# Patient Record
Sex: Male | Born: 1945 | Race: White | Hispanic: No | Marital: Married | Smoking: Former smoker
Health system: Southern US, Community
[De-identification: ages and names within clinical notes are randomized; demographics above are authoritative.]

## PROBLEM LIST (undated history)

## (undated) DIAGNOSIS — I2510 Atherosclerotic heart disease of native coronary artery without angina pectoris: Secondary | ICD-10-CM

## (undated) DIAGNOSIS — L570 Actinic keratosis: Secondary | ICD-10-CM

## (undated) DIAGNOSIS — I10 Essential (primary) hypertension: Secondary | ICD-10-CM

## (undated) DIAGNOSIS — I700 Atherosclerosis of aorta: Secondary | ICD-10-CM

## (undated) DIAGNOSIS — R9389 Abnormal findings on diagnostic imaging of other specified body structures: Secondary | ICD-10-CM

## (undated) DIAGNOSIS — H409 Unspecified glaucoma: Secondary | ICD-10-CM

## (undated) DIAGNOSIS — J45991 Cough variant asthma: Secondary | ICD-10-CM

## (undated) DIAGNOSIS — N400 Enlarged prostate without lower urinary tract symptoms: Secondary | ICD-10-CM

## (undated) DIAGNOSIS — E7800 Pure hypercholesterolemia, unspecified: Secondary | ICD-10-CM

## (undated) DIAGNOSIS — G8929 Other chronic pain: Secondary | ICD-10-CM

## (undated) DIAGNOSIS — J309 Allergic rhinitis, unspecified: Secondary | ICD-10-CM

## (undated) DIAGNOSIS — J189 Pneumonia, unspecified organism: Secondary | ICD-10-CM

## (undated) DIAGNOSIS — K869 Disease of pancreas, unspecified: Secondary | ICD-10-CM

## (undated) DIAGNOSIS — J449 Chronic obstructive pulmonary disease, unspecified: Secondary | ICD-10-CM

## (undated) DIAGNOSIS — E119 Type 2 diabetes mellitus without complications: Secondary | ICD-10-CM

## (undated) DIAGNOSIS — D649 Anemia, unspecified: Secondary | ICD-10-CM

## (undated) DIAGNOSIS — M1990 Unspecified osteoarthritis, unspecified site: Secondary | ICD-10-CM

## (undated) HISTORY — DX: Pure hypercholesterolemia, unspecified: E78.00

## (undated) HISTORY — DX: Chronic obstructive pulmonary disease, unspecified: J44.9

## (undated) HISTORY — DX: Disease of pancreas, unspecified: K86.9

## (undated) HISTORY — DX: Anemia, unspecified: D64.9

## (undated) HISTORY — DX: Allergic rhinitis, unspecified: J30.9

## (undated) HISTORY — DX: Abnormal findings on diagnostic imaging of other specified body structures: R93.89

## (undated) HISTORY — DX: Cough variant asthma: J45.991

## (undated) HISTORY — DX: Benign prostatic hyperplasia without lower urinary tract symptoms: N40.0

## (undated) HISTORY — DX: Essential (primary) hypertension: I10

## (undated) HISTORY — DX: Unspecified glaucoma: H40.9

## (undated) HISTORY — DX: Other chronic pain: G89.29

## (undated) HISTORY — DX: Atherosclerosis of aorta: I70.0

## (undated) HISTORY — DX: Atherosclerotic heart disease of native coronary artery without angina pectoris: I25.10

## (undated) HISTORY — PX: BACK SURGERY: SHX140

## (undated) HISTORY — DX: Actinic keratosis: L57.0

## (undated) HISTORY — PX: TONSILLECTOMY: SUR1361

## (undated) HISTORY — DX: Type 2 diabetes mellitus without complications: E11.9

## (undated) HISTORY — PX: CERVICAL DISC SURGERY: SHX588

## (undated) HISTORY — PX: LEG SURGERY: SHX1003

---

## 1989-07-26 HISTORY — PX: KNEE ARTHROSCOPY: SHX127

## 1994-07-26 HISTORY — PX: ROTATOR CUFF REPAIR: SHX139

## 1997-07-26 HISTORY — PX: ROTATOR CUFF REPAIR: SHX139

## 1998-02-13 ENCOUNTER — Other Ambulatory Visit: Admission: RE | Admit: 1998-02-13 | Discharge: 1998-02-13 | Payer: Self-pay | Admitting: Urology

## 1999-12-25 ENCOUNTER — Encounter: Payer: Self-pay | Admitting: Emergency Medicine

## 1999-12-25 ENCOUNTER — Emergency Department (HOSPITAL_COMMUNITY): Admission: EM | Admit: 1999-12-25 | Discharge: 1999-12-25 | Payer: Self-pay | Admitting: Emergency Medicine

## 2000-07-26 HISTORY — PX: PROSTATE SURGERY: SHX751

## 2001-07-26 HISTORY — PX: BUNIONECTOMY: SHX129

## 2001-09-05 ENCOUNTER — Ambulatory Visit (HOSPITAL_COMMUNITY): Admission: RE | Admit: 2001-09-05 | Discharge: 2001-09-05 | Payer: Self-pay | Admitting: Gastroenterology

## 2001-09-05 ENCOUNTER — Encounter (INDEPENDENT_AMBULATORY_CARE_PROVIDER_SITE_OTHER): Payer: Self-pay | Admitting: Specialist

## 2002-05-03 ENCOUNTER — Observation Stay (HOSPITAL_COMMUNITY): Admission: RE | Admit: 2002-05-03 | Discharge: 2002-05-04 | Payer: Self-pay | Admitting: Orthopedic Surgery

## 2008-07-26 HISTORY — PX: GREEN LIGHT LASER TURP (TRANSURETHRAL RESECTION OF PROSTATE: SHX6260

## 2008-07-26 HISTORY — PX: LUMBAR DISC SURGERY: SHX700

## 2009-01-18 ENCOUNTER — Encounter: Admission: RE | Admit: 2009-01-18 | Discharge: 2009-01-18 | Payer: Self-pay | Admitting: Family Medicine

## 2009-02-10 ENCOUNTER — Ambulatory Visit (HOSPITAL_COMMUNITY): Admission: RE | Admit: 2009-02-10 | Discharge: 2009-02-10 | Payer: Self-pay | Admitting: Neurological Surgery

## 2009-07-26 HISTORY — PX: LUMBAR DISC SURGERY: SHX700

## 2009-09-11 ENCOUNTER — Emergency Department (HOSPITAL_COMMUNITY): Admission: EM | Admit: 2009-09-11 | Discharge: 2009-09-11 | Payer: Self-pay | Admitting: Emergency Medicine

## 2010-11-01 LAB — CBC
HCT: 38.8 % — ABNORMAL LOW (ref 39.0–52.0)
Hemoglobin: 13.4 g/dL (ref 13.0–17.0)
MCHC: 34.5 g/dL (ref 30.0–36.0)
MCV: 91.1 fL (ref 78.0–100.0)
Platelets: 137 10*3/uL — ABNORMAL LOW (ref 150–400)
RBC: 4.26 MIL/uL (ref 4.22–5.81)
RDW: 14.2 % (ref 11.5–15.5)
WBC: 6.6 10*3/uL (ref 4.0–10.5)

## 2010-11-01 LAB — BASIC METABOLIC PANEL
BUN: 28 mg/dL — ABNORMAL HIGH (ref 6–23)
CO2: 29 mEq/L (ref 19–32)
Calcium: 9.2 mg/dL (ref 8.4–10.5)
Chloride: 102 mEq/L (ref 96–112)
Creatinine, Ser: 0.87 mg/dL (ref 0.4–1.5)
GFR calc Af Amer: >60 mL/min (ref >60)
GFR calc non Af Amer: >60 mL/min (ref >60)
Glucose, Bld: 121 mg/dL — ABNORMAL HIGH (ref 70–99)
Potassium: 5 mEq/L (ref 3.5–5.1)
Sodium: 138 mEq/L (ref 135–145)

## 2010-11-01 LAB — GLUCOSE, CAPILLARY
Glucose-Capillary: 101 mg/dL — ABNORMAL HIGH (ref 70–99)
Glucose-Capillary: 108 mg/dL — ABNORMAL HIGH (ref 70–99)
Glucose-Capillary: 112 mg/dL — ABNORMAL HIGH (ref 70–99)
Glucose-Capillary: 91 mg/dL (ref 70–99)

## 2010-12-08 NOTE — Op Note (Signed)
NAME:  Isaac Weber, Isaac Weber              ACCOUNT NO.:  [id number]   MEDICAL RECORD NO.:  [id number]          PATIENT TYPE:  OIB   LOCATION:  3528                         FACILITY:  [facility]   PHYSICIAN:  Stefani Dama, M.D.  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   OPERATION:  Microdiskectomy, L1-L2 using operating microscope and  microdissection technique.   SURGEON:  Stefani Dama, MD   FIRST ASSISTANT:  Hilda Lias, MD   ANESTHESIA:  General endotracheal.   INDICATIONS:  Isaac Weber is a 65 year old individual who has had  significant back and left lower extremity pain in the proximal region of  the hip.  He was found to have a herniated nucleus pulposus at L1-L2 on  the left side on his MRI.  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  Because of the acuity of his radicular pain, I  believe the herniated nucleus pulposus at L1-L2 was giving him this  problem.  He is failing conservative management and advised regarding  surgical decompression.   PROCEDURE:  The patient was brought to the operating room, placed on the  table in supine position.  After the smooth induction of general  endotracheal anesthesia, he was turned prone.  The back was prepped with  alcohol, DuraPrep, and draped in sterile fashion.  Midline incision was  created approximately at the level of L1-L2.  Needle was used to  localize the internal mammary space, which radiographically turned out  to be at L1-L2.  The subperiosteal dissection was performed at the L1-L2  interspace and then with the muscle being retracted, laminotomy was  created at L1-L2 removing the  inferior margin lamina of L1, superior  margin lamina of L2 and the yellow ligament intervening.  Dissection was  carried out laterally and then along the lateral border of the dura,  just above the area of the nerve root.  The dura was noted to be tented  dorsally and by dissecting the lateral margin, the dura could be  retracted medially and thus identified a fragment of disk.  This disk  was removed from this area and several other fragments presented  themselves and they are also removed.  This was just below the level of  the L1-L2 disk space.  Further palpation yielded some other small  fragments of disk, but almost immediately there was noted to be good  decompression of the central dural tube and the takeoff of the L2 nerve  root that was coming out just below this.  The L2 nerve root could then  be palpated medially to this.  Some dissection was undertaken to see if  there are any other fragments of disk, none were identified.  Hemostasis  was then obtained in the epidural space.  The area of the disk  space  itself was then examined.  The ligament was noted to be tightly adherent  over the posterior surface of the interspace and with this minimal  hemorrhage was encountered.  No spinal fluid leaks were noted.  The  retractor was removed.  The lumbodorsal fascia was closed with #1 Vicryl  in an interrupted fashion, 2-0  Vicryl was used in the subcutaneous tissues, 3-0 Vicryl subcuticularly,  and Dermabond was placed on the skin.  The patient tolerated the  procedure well, was returned to recovery room in stable condition.   BLOOD LOSS:  Minimal.      Stefani Dama, M.D.  Electronically Signed     HJE/MEDQ  D:  02/10/2009  T:  02/11/2009  Job:  [id number]

## 2010-12-11 NOTE — Procedures (Signed)
[facility]  Patient:    Isaac Weber, Isaac Weber Visit Number: [id number] MRN: [id number]          Service Type: END Location: ENDO Attending Physician:  Louie Bun Dictated by:   Everardo All Madilyn Fireman, M.D. Proc. Date: 09/05/01 Admit Date:  09/05/2001   CC:         Al Decant. Janey Greaser, M.D.   Procedure Report  PROCEDURE:  Esophagogastroduodenoscopy with biopsy.  ENDOSCOPIST:  Everardo All. Madilyn Fireman, M.D.  INDICATIONS FOR PROCEDURE:  Anemia in a patient on chronic nonsteroidal anti-inflammatory drugs for arthritis in whom colonoscopy prior to this procedure revealed no abnormalities.  DESCRIPTION OF PROCEDURE:  The patient was placed in the left lateral decubitus position and placed on the pulse monitor with conscious low-flow oxygen delivered by nasal cannula.  He was sedated with 10 mg IV Demerol and 1 mg of Versed in addition to the medicines received for the previous colonoscopy.  The Olympus videoendoscope was advanced under direct vision into the oropharynx and esophagus.  The esophagus was straight and of normal caliber at the squamocolumnar line at 38 cm above a 1.5 cm sliding hiatal hernia.  There is no ring, stricture, or esophagitis visible.  The stomach was entered and a small amount of liquid secretions were suctioned from the fundus.  A retroflex view of the cardia confirmed a small hiatal hernia and was otherwise unremarkable.  The fundus and body appeared normal. The antrum showed some mild erythema and granularity consistent with gastritis.  A CLOtest was obtained.  Duodenum was entered and both the bulb and second portion were well-inspected and appeared to be within normal limits.  The scope was then withdrawn and the patient returned to the recovery room in stable condition.  He tolerated the procedure well and there were no immediate complications.  IMPRESSION: 1. Mild antral gastritis. 2. Small hiatal hernia.  PLAN:  Await histology  to assess for Helicobacter and celiac disease. Dictated by:   Everardo All Madilyn Fireman, M.D. Attending Physician:  Louie Bun DD:  09/05/01 TD:  09/05/01 Job: [id number]

## 2010-12-11 NOTE — Op Note (Signed)
NAME:  Isaac Weber, Isaac Weber                          ACCOUNT NO.:  [id number]   MEDICAL RECORD NO.:  [id number]                   PATIENT TYPE:  AMB   LOCATION:  DAY                                  FACILITY:  [facility]   PHYSICIAN:  Marlowe Kays, MD                 DATE OF BIRTH:  04/10/1946   DATE OF PROCEDURE:  DATE OF DISCHARGE:                                 OPERATIVE REPORT   PREOPERATIVE DIAGNOSES:  1. 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  2. Painful clawing second toe, left foot.   POSTOPERATIVE DIAGNOSES:  1. 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  2. Painful clawing second toe, left foot.   OPERATION:  1. Funk bunionectomy.  2. Claw toe deformity second toe with resection of base of proximal phalanx     and fusion of PIP joint.   SURGEON:  Marlowe Kays, MD   ASSISTANT:  Nurse.   ANESTHESIA:  General.   PATHOLOGY AND JUSTIFICATION FOR PROCEDURE:  He has a number of deformities  in his left foot. His lesser three toes all have some mild claw toe  deformity but they were not giving him much in the way of any problem and we  are not going to be doing anything surgically for them today. His second  toe, however, not only had a severe claw toe deformity but was also  dislocated at the MP joint. A good 15 degree first and second metatarsal  angle and large bunion with fixed hallux valgus deformity. Various options  for correcting the metatarsal primi's varus bunion and hallux valgus were  discussed with him and I felt that a Funk bunionectomy would be the one that  would give a combination of optimal results and quick recuperation time  which is what he needed.   DESCRIPTION OF PROCEDURE:  Satisfactory general anesthesia, pneumatic  tourniquet, Duraprep, the foot and ankle was draped in a sterile field.  Dorsal medial incision over the distal first metatarsal going down over the  base  of the proximal phalanx of the great toe. The dorsal sensory nerve was  protected and isolated from the capsule which was opened with flap based  distally. A large bunion deformity was isolated and bunionectomy performed  making a cut first to the base of the bunion proximally and then working  from distally to proximally with osteotome and rongeur. A small dorsal  bunion component was also removed. Following subperiosteal dissection, I  placed protective retractors superiorly and inferiorly to the distal  metatarsal and marked out two lines for the metatarsal osteotomy with the  distal one 1 cm from the articular surface and second one 6 mm proximal to  this. I then made a transverse cut and a more proximal one keeping the  lateral cortex intact and then made an oblique  cut at the more distal mark  removing the wedge of bone. The lateral cortex had been perforated with  small hand osteotome until it was weakened enough that I could close the  osteotomy down and swing the great toe and the metatarsal head together as a  unit correcting the valgus deformity. The additional bunion bone was removed  from the distal portion of the first metatarsal head. I then made a dorsal  extensor splitting incision going from roughly the DIP joint down proximal  to the MP joint of the second toe. The base of the proximal phalanx which  was dislocated on top of the second metatarsal head, it was isolated to  protect the underlying flexor tendons. I used the microsaw to osteotomize  the proximal phalanx at the metaphyseal flare. This allowed me to reduce the  second toe but still did not correct the PIP deformity and there was also a  secondary hyperextension deformity at the DIP joint. I isolated the head of  the middle phalanx and used the microsaw to make a perpendicular cut. I then  isolated the base of the middle phalanx and likewise made a transverse cut  there removing enough bone until I could  anatomically correct the deformity  at the PIP joint. I then used 0.45 smooth K wire which I placed through the  distal toe first and then retrograde through the PIP joint until I had it in  position which I wished and then advanced it stabilizing the second toe into  the second metatarsal head. Both wounds were then irrigated with antibiotic  solution and the toe was infiltrated with 0.5% plain Marcaine. The extensor  mechanism was closed with running 4-0 Vicryl in the second toe and the skin  with interrupted 4-0 nylon. The pin was bent and cut and covered with a pin  cap. The great toe was held in corrected position and the capsule closed  with #0 Vicryl and the skin and subcutaneous tissue with interrupted 4-0  nylon. I also placed small amounts of cancellous bone at the medial cortical  edge of the wedge osteotomy. Betadine Adaptic dry sterile dressing and  sterile tongue blade wrapped in Webril was then used along the inner border  of the foot and the great toe. The tourniquet was released. He tolerated the  procedure well and was taken to the recovery room in satisfactory condition  with no known complications.                                                Marlowe Kays, MD    JA/MEDQ  D:  05/03/2002  T:  05/03/2002  Job:  [id number]

## 2010-12-11 NOTE — Procedures (Signed)
[facility]  Patient:    ANUBIS, FUNDORA Visit Number: [id number] MRN: [id number]          Service Type: END Location: ENDO Attending Physician:  Louie Bun Dictated by:   Everardo All Madilyn Fireman, M.D. Proc. Date: 09/05/01 Admit Date:  09/05/2001   CC:         Al Decant. Janey Greaser, M.D.                           Procedure Report  PROCEDURE:  Colonoscopy.  SURGEON:  John C. Madilyn Fireman, M.D.  INDICATIONS FOR PROCEDURE:  A 65 year old patient with anemia.  He also has chronic arthritis and takes nonsteroidal anti-inflammatory drugs.  DESCRIPTION OF PROCEDURE:  The patient was placed in the left lateral decubitus position and placed on the pulse monitor with continuous low flow oxygen delivered by nasal cannula.  He was sedated with 60 mg of IV Demerol and 6 mg of IV Versed.  The Olympus video colonoscope was inserted into the rectum and advanced to the cecum, confirmed by transillumination at McBurneys point, and visualization of the ileocecal valve and appendiceal orifice.  The prep was excellent.  The cecum, ascending, transverse, descending, and sigmoid colon all appeared normal with no masses, polyps, diverticula, or other mucosal abnormalities.  The rectum likewise appeared normal with retroflexed view of the anus showing only small non-thrombosed internal hemorrhoids.  The colonoscope was then withdrawn and the patient returned to the recovery room in stable condition.  He tolerated the procedure well and there were no immediate complications.  IMPRESSION:  Small internal hemorrhoids, otherwise normal colonoscopy.  PLAN:  Based on the anemia, lack of findings on this study, and chronic use of NSAIDs, will pursue EGD subsequent to this procedure. Dictated by:   Everardo All Madilyn Fireman, M.D. Attending Physician:  Louie Bun DD:  09/05/01 TD:  09/05/01 Job: [id number]

## 2011-07-27 HISTORY — PX: BUNIONECTOMY WITH HAMMERTOE RECONSTRUCTION: SHX5600

## 2014-04-05 ENCOUNTER — Encounter: Payer: Self-pay | Admitting: *Deleted

## 2014-04-05 DIAGNOSIS — I10 Essential (primary) hypertension: Secondary | ICD-10-CM | POA: Insufficient documentation

## 2014-08-26 ENCOUNTER — Telehealth: Payer: Self-pay | Admitting: Internal Medicine

## 2014-08-26 NOTE — Telephone Encounter (Signed)
S/W PT IN REF TO NP APPT. 09/16/14@1 :60 REFERRING- DR Aundra Millet

## 2014-09-13 ENCOUNTER — Other Ambulatory Visit: Payer: Self-pay | Admitting: Medical Oncology

## 2014-09-13 DIAGNOSIS — D649 Anemia, unspecified: Secondary | ICD-10-CM

## 2014-09-16 ENCOUNTER — Ambulatory Visit (HOSPITAL_BASED_OUTPATIENT_CLINIC_OR_DEPARTMENT_OTHER): Payer: Self-pay

## 2014-09-16 ENCOUNTER — Encounter: Payer: Self-pay | Admitting: Internal Medicine

## 2014-09-16 ENCOUNTER — Other Ambulatory Visit: Payer: Self-pay | Admitting: Internal Medicine

## 2014-09-16 ENCOUNTER — Ambulatory Visit (HOSPITAL_BASED_OUTPATIENT_CLINIC_OR_DEPARTMENT_OTHER): Payer: Commercial Managed Care - HMO | Admitting: Internal Medicine

## 2014-09-16 ENCOUNTER — Encounter (INDEPENDENT_AMBULATORY_CARE_PROVIDER_SITE_OTHER): Payer: Self-pay

## 2014-09-16 ENCOUNTER — Other Ambulatory Visit (HOSPITAL_BASED_OUTPATIENT_CLINIC_OR_DEPARTMENT_OTHER): Payer: Commercial Managed Care - HMO

## 2014-09-16 VITALS — BP 157/89 | HR 112 | Temp 98.4°F | Resp 19 | Wt 177.2 lb

## 2014-09-16 DIAGNOSIS — D649 Anemia, unspecified: Secondary | ICD-10-CM

## 2014-09-16 DIAGNOSIS — D539 Nutritional anemia, unspecified: Secondary | ICD-10-CM

## 2014-09-16 DIAGNOSIS — D509 Iron deficiency anemia, unspecified: Secondary | ICD-10-CM

## 2014-09-16 LAB — CBC & DIFF AND RETIC
BASO%: 0.4 % (ref 0.0–2.0)
Basophils Absolute: 0 10*3/uL (ref 0.0–0.1)
EOS%: 3.9 % (ref 0.0–7.0)
Eosinophils Absolute: 0.3 10*3/uL (ref 0.0–0.5)
HEMATOCRIT: 33.6 % — AB (ref 38.4–49.9)
HEMOGLOBIN: 11.6 g/dL — AB (ref 13.0–17.1)
IMMATURE RETIC FRACT: 12.3 % — AB (ref 3.00–10.60)
LYMPH%: 26.6 % (ref 14.0–49.0)
MCH: 28.1 pg (ref 27.2–33.4)
MCHC: 34.5 g/dL (ref 32.0–36.0)
MCV: 81.4 fL (ref 79.3–98.0)
MONO#: 0.6 10*3/uL (ref 0.1–0.9)
MONO%: 8.8 % (ref 0.0–14.0)
NEUT%: 60.3 % (ref 39.0–75.0)
NEUTROS ABS: 4 10*3/uL (ref 1.5–6.5)
Platelets: 232 10*3/uL (ref 140–400)
RBC: 4.13 10*6/uL — AB (ref 4.20–5.82)
RDW: 13.9 % (ref 11.0–14.6)
Retic %: 1.55 % (ref 0.80–1.80)
Retic Ct Abs: 64.02 10*3/uL (ref 34.80–93.90)
WBC: 6.7 10*3/uL (ref 4.0–10.3)
lymph#: 1.8 10*3/uL (ref 0.9–3.3)
nRBC: 0 % (ref 0–0)

## 2014-09-16 LAB — COMPREHENSIVE METABOLIC PANEL (CC13)
ALK PHOS: 108 U/L (ref 40–150)
ALT: 18 U/L (ref 0–55)
ANION GAP: 10 meq/L (ref 3–11)
AST: 19 U/L (ref 5–34)
Albumin: 3.8 g/dL (ref 3.5–5.0)
BUN: 16.7 mg/dL (ref 7.0–26.0)
CO2: 26 meq/L (ref 22–29)
CREATININE: 0.9 mg/dL (ref 0.7–1.3)
Calcium: 9.4 mg/dL (ref 8.4–10.4)
Chloride: 104 mEq/L (ref 98–109)
EGFR: 82 mL/min/{1.73_m2} — ABNORMAL LOW (ref >90)
GLUCOSE: 153 mg/dL — AB (ref 70–140)
POTASSIUM: 3.7 meq/L (ref 3.5–5.1)
Sodium: 140 mEq/L (ref 136–145)
TOTAL PROTEIN: 7.3 g/dL (ref 6.4–8.3)
Total Bilirubin: 0.38 mg/dL (ref 0.20–1.20)

## 2014-09-16 LAB — IRON AND TIBC CHCC
%SAT: 14 % — AB (ref 20–55)
IRON: 55 ug/dL (ref 42–163)
TIBC: 395 ug/dL (ref 202–409)
UIBC: 340 ug/dL (ref 117–376)

## 2014-09-16 LAB — FERRITIN CHCC: FERRITIN: 10 ng/mL — AB (ref 22–316)

## 2014-09-16 LAB — LACTATE DEHYDROGENASE (CC13): LDH: 164 U/L (ref 125–245)

## 2014-09-16 NOTE — Progress Notes (Signed)
CHECKED IN NEW PATIENT WITH NO ISSUES. HE HAS APPT CRD AND HAS NOT BEEN TRAVELING.

## 2014-09-16 NOTE — Progress Notes (Signed)
[place] Telephone:([phone]   Fax:([phone]) [phone]  CONSULT NOTE  REFERRING PHYSICIAN: Dr. Leighton Ruff  REASON FOR CONSULTATION:  69 years old white male with iron deficiency anemia  HPI Isaac MCCANTS is a 69 y.o. 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 The patient was seen recently by his primary care physician Dr. Drema Dallas for routine evaluation and repeat CBC on 08/13/2014 showed hemoglobin of 12.4 and hematocrit 37.5% ferritin level was performed and it was low at 9.0. She referred the patient to me today for evaluation and recommendation regarding his condition. The patient had colonoscopy in May 2009 as well as upper endoscopy in June 2010 that were unremarkable. He denied having any bleeding issues. He recently had stool for Hemoccult but the result is still pending. He denied having any easy bruising, ecchymosis or other bleeding issues. He is a frequent blood donor to the [employer] and he does it every 8 weeks.  He has no significant complaints from his mild anemia. The patient exercises at regular basis. He denied having any fatigue or dizzy spells. He denied having any headache or visual changes. He has no chest pain, shortness breath, cough or hemoptysis. Family history significant for mother with heart disease, father had COPD, brother had heart disease and sister with arthritis. The patient is married and has 3 daughters. He is currently retired and used to work for [employer]. He has a remote history of smoking for around 13 years but quit in 1973. He drinks alcohol occasionally and no history of drug abuse.  HPI  Past Medical History  Diagnosis Date  . Diabetes mellitus without complication   . Hypertension   . BPH (benign  prostatic hyperplasia)     Past Surgical History  Procedure Laterality Date  . Knee arthroscopy    . Bunionectomy      Family History  Problem Relation Age of Onset  . Family history unknown: Yes    Social History History  Substance Use Topics  . Smoking status: Former Smoker    Quit date: 09/17/1971  . Smokeless tobacco: Not on file  . Alcohol Use: No    Allergies  Allergen Reactions  . Ciprofloxacin   . Morphine And Related     Current Outpatient Prescriptions  Medication Sig Dispense Refill  . amLODipine (NORVASC) 5 MG tablet Take 5 mg by mouth daily.    Marland Kitchen atorvastatin (LIPITOR) 40 MG tablet Take 40 mg by mouth every other day.     . Calcium Carb-Cholecalciferol (CALCIUM + D3 PO) Take by mouth.    . Glucosamine-Chondroitin (GLUCOSAMINE CHONDR COMPLEX PO) Take by mouth 2 (two) times daily.     Marland Kitchen losartan-hydrochlorothiazide (HYZAAR) 50-12.5 MG per tablet Take 1 tablet by mouth daily.    . metFORMIN (GLUCOPHAGE) 500 MG tablet Take by mouth 2 (two) times daily with a meal.    . ibuprofen (ADVIL,MOTRIN) 200 MG tablet Take 200 mg by mouth every 6 (six) hours as needed.     No current facility-administered medications for this visit.    Review of Systems  Constitutional: negative Eyes: negative Ears, nose, mouth, throat, and face: negative Respiratory: negative Cardiovascular: negative Gastrointestinal: negative Genitourinary:negative Integument/breast: negative Hematologic/lymphatic: negative Musculoskeletal:negative Neurological: negative Behavioral/Psych: negative Endocrine: negative Allergic/Immunologic: negative  Physical Exam  MIW:OEHOZ, healthy,  no distress, well nourished and well developed SKIN: skin color, texture, turgor are normal, no rashes or significant lesions HEAD: Normocephalic, No masses, lesions, tenderness or abnormalities EYES: normal, PERRLA EARS: External ears normal, Canals clear OROPHARYNX:no exudate, no erythema and lips,  buccal mucosa, and tongue normal  NECK: supple, no adenopathy, no JVD LYMPH:  no palpable lymphadenopathy, no hepatosplenomegaly LUNGS: clear to auscultation , and palpation HEART: regular rate & rhythm and no murmurs ABDOMEN:abdomen soft, non-tender, normal bowel sounds and no masses or organomegaly BACK: Back symmetric, no curvature., No CVA tenderness EXTREMITIES:no joint deformities, effusion, or inflammation, no edema, no skin discoloration  NEURO: alert & oriented x 3 with fluent speech, no focal motor/sensory deficits  PERFORMANCE STATUS: ECOG 1  LABORATORY DATA: Lab Results  Component Value Date   WBC 6.7 09/16/2014   HGB 11.6* 09/16/2014   HCT 33.6* 09/16/2014   MCV 81.4 09/16/2014   PLT 232 09/16/2014      Chemistry      Component Value Date/Time   NA 140 09/16/2014 1354   NA 138 02/10/2009 0835   K 3.7 09/16/2014 1354   K 5.0 HEMOLYZED SPECIMEN, RESULTS MAY BE AFFECTED 02/10/2009 0835   CL 102 02/10/2009 0835   CO2 26 09/16/2014 1354   CO2 29 02/10/2009 0835   BUN 16.7 09/16/2014 1354   BUN 28* 02/10/2009 0835   CREATININE 0.9 09/16/2014 1354   CREATININE 0.87 02/10/2009 0835      Component Value Date/Time   CALCIUM 9.4 09/16/2014 1354   CALCIUM 9.2 02/10/2009 0835   ALKPHOS 108 09/16/2014 1354   AST 19 09/16/2014 1354   ALT 18 09/16/2014 1354   BILITOT 0.38 09/16/2014 1354       RADIOGRAPHIC STUDIES: No results found.  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 The patient has no history of bleeding, bruises or ecchymosis.   PLAN: I had a lengthy discussion with the patient today about his condition. I ordered several studies for evaluation of his anemia and to rule out any other etiology. I will repeat CBC, comprehensive metabolic panel, LDH, serum folate, serum B-12 level, serum protein electrophoresis as well as serum erythropoietin. I recommended for the patient to  start taking oral iron tablets at regular basis especially with his frequent blood donation and iron deficiency from the blood donation. If no significant abnormalities in the pending blood work, the patient will follow with his primary care physician as previously scheduled and I'll be happy to see him in the future if needed. The patient agreed to the current plan. He was advised to call if he has any concerning symptoms.  The patient voices understanding of current disease status and treatment options and is in agreement with the current care plan.  All questions were answered. The patient knows to call the clinic with any problems, questions or concerns. We can certainly see the patient much sooner if necessary.  Thank you so much for allowing me to participate in the care of Isaac Weber. I will continue to follow up the patient with you and assist in his care.  I spent 40 minutes counseling the patient face to face. The total time spent in the appointment was 60 minutes.  Disclaimer: This note was dictated with voice recognition software. Similar sounding words can inadvertently be transcribed and may not be corrected upon review.   Rene Sizelove K. September 16, 2014, 3:11 PM

## 2014-09-18 LAB — PROTEIN ELECTROPHORESIS, SERUM, WITH REFLEX
ALPHA-2-GLOBULIN: 10 % (ref 7.1–11.8)
Albumin ELP: 57.9 % (ref 55.8–66.1)
Alpha-1-Globulin: 4.6 % (ref 2.9–4.9)
Beta 2: 4.6 % (ref 3.2–6.5)
Beta Globulin: 7.3 % — ABNORMAL HIGH (ref 4.7–7.2)
GAMMA GLOBULIN: 15.6 % (ref 11.1–18.8)
TOTAL PROTEIN, SERUM ELECTROPHOR: 6.7 g/dL (ref 6.0–8.3)

## 2014-09-18 LAB — ERYTHROPOIETIN: ERYTHROPOIETIN: 35.9 m[IU]/mL — AB (ref 2.6–18.5)

## 2014-09-18 LAB — FOLATE

## 2014-09-18 LAB — VITAMIN B12: Vitamin B-12: 1101 pg/mL — ABNORMAL HIGH (ref 211–911)

## 2015-08-19 DIAGNOSIS — E7800 Pure hypercholesterolemia, unspecified: Secondary | ICD-10-CM | POA: Diagnosis not present

## 2015-08-19 DIAGNOSIS — I10 Essential (primary) hypertension: Secondary | ICD-10-CM | POA: Diagnosis not present

## 2015-08-19 DIAGNOSIS — E119 Type 2 diabetes mellitus without complications: Secondary | ICD-10-CM | POA: Diagnosis not present

## 2015-08-19 DIAGNOSIS — H5203 Hypermetropia, bilateral: Secondary | ICD-10-CM | POA: Diagnosis not present

## 2015-08-19 DIAGNOSIS — Z0000 Encounter for general adult medical examination without abnormal findings: Secondary | ICD-10-CM | POA: Diagnosis not present

## 2015-08-19 DIAGNOSIS — D509 Iron deficiency anemia, unspecified: Secondary | ICD-10-CM | POA: Diagnosis not present

## 2015-08-19 DIAGNOSIS — Z7984 Long term (current) use of oral hypoglycemic drugs: Secondary | ICD-10-CM | POA: Diagnosis not present

## 2015-08-19 DIAGNOSIS — N400 Enlarged prostate without lower urinary tract symptoms: Secondary | ICD-10-CM | POA: Diagnosis not present

## 2015-08-19 DIAGNOSIS — Z1389 Encounter for screening for other disorder: Secondary | ICD-10-CM | POA: Diagnosis not present

## 2015-08-19 DIAGNOSIS — H2513 Age-related nuclear cataract, bilateral: Secondary | ICD-10-CM | POA: Diagnosis not present

## 2015-09-29 DIAGNOSIS — N401 Enlarged prostate with lower urinary tract symptoms: Secondary | ICD-10-CM | POA: Diagnosis not present

## 2015-09-29 DIAGNOSIS — Z0000 Encounter for general adult medical examination without abnormal findings: Secondary | ICD-10-CM | POA: Diagnosis not present

## 2015-09-29 DIAGNOSIS — Z125 Encounter for screening for malignant neoplasm of prostate: Secondary | ICD-10-CM | POA: Diagnosis not present

## 2015-09-29 DIAGNOSIS — R351 Nocturia: Secondary | ICD-10-CM | POA: Diagnosis not present

## 2015-11-12 DIAGNOSIS — R21 Rash and other nonspecific skin eruption: Secondary | ICD-10-CM | POA: Diagnosis not present

## 2015-11-12 DIAGNOSIS — L820 Inflamed seborrheic keratosis: Secondary | ICD-10-CM | POA: Diagnosis not present

## 2015-11-12 DIAGNOSIS — L570 Actinic keratosis: Secondary | ICD-10-CM | POA: Diagnosis not present

## 2015-11-12 DIAGNOSIS — L821 Other seborrheic keratosis: Secondary | ICD-10-CM | POA: Diagnosis not present

## 2015-11-12 DIAGNOSIS — B372 Candidiasis of skin and nail: Secondary | ICD-10-CM | POA: Diagnosis not present

## 2015-11-12 DIAGNOSIS — L720 Epidermal cyst: Secondary | ICD-10-CM | POA: Diagnosis not present

## 2015-11-12 DIAGNOSIS — L578 Other skin changes due to chronic exposure to nonionizing radiation: Secondary | ICD-10-CM | POA: Diagnosis not present

## 2016-02-19 DIAGNOSIS — E7800 Pure hypercholesterolemia, unspecified: Secondary | ICD-10-CM | POA: Diagnosis not present

## 2016-02-19 DIAGNOSIS — E119 Type 2 diabetes mellitus without complications: Secondary | ICD-10-CM | POA: Diagnosis not present

## 2016-02-19 DIAGNOSIS — I10 Essential (primary) hypertension: Secondary | ICD-10-CM | POA: Diagnosis not present

## 2016-02-19 DIAGNOSIS — D509 Iron deficiency anemia, unspecified: Secondary | ICD-10-CM | POA: Diagnosis not present

## 2016-02-19 DIAGNOSIS — Z7984 Long term (current) use of oral hypoglycemic drugs: Secondary | ICD-10-CM | POA: Diagnosis not present

## 2016-02-26 DIAGNOSIS — M9903 Segmental and somatic dysfunction of lumbar region: Secondary | ICD-10-CM | POA: Diagnosis not present

## 2016-02-26 DIAGNOSIS — M5136 Other intervertebral disc degeneration, lumbar region: Secondary | ICD-10-CM | POA: Diagnosis not present

## 2016-02-26 DIAGNOSIS — M5432 Sciatica, left side: Secondary | ICD-10-CM | POA: Diagnosis not present

## 2016-02-26 DIAGNOSIS — M9905 Segmental and somatic dysfunction of pelvic region: Secondary | ICD-10-CM | POA: Diagnosis not present

## 2016-02-27 DIAGNOSIS — M9905 Segmental and somatic dysfunction of pelvic region: Secondary | ICD-10-CM | POA: Diagnosis not present

## 2016-02-27 DIAGNOSIS — M5136 Other intervertebral disc degeneration, lumbar region: Secondary | ICD-10-CM | POA: Diagnosis not present

## 2016-02-27 DIAGNOSIS — M9903 Segmental and somatic dysfunction of lumbar region: Secondary | ICD-10-CM | POA: Diagnosis not present

## 2016-02-27 DIAGNOSIS — M5432 Sciatica, left side: Secondary | ICD-10-CM | POA: Diagnosis not present

## 2016-02-28 DIAGNOSIS — M9905 Segmental and somatic dysfunction of pelvic region: Secondary | ICD-10-CM | POA: Diagnosis not present

## 2016-02-28 DIAGNOSIS — M5432 Sciatica, left side: Secondary | ICD-10-CM | POA: Diagnosis not present

## 2016-02-28 DIAGNOSIS — M9903 Segmental and somatic dysfunction of lumbar region: Secondary | ICD-10-CM | POA: Diagnosis not present

## 2016-02-28 DIAGNOSIS — M5136 Other intervertebral disc degeneration, lumbar region: Secondary | ICD-10-CM | POA: Diagnosis not present

## 2016-03-01 DIAGNOSIS — M9905 Segmental and somatic dysfunction of pelvic region: Secondary | ICD-10-CM | POA: Diagnosis not present

## 2016-03-01 DIAGNOSIS — M9903 Segmental and somatic dysfunction of lumbar region: Secondary | ICD-10-CM | POA: Diagnosis not present

## 2016-03-01 DIAGNOSIS — M5432 Sciatica, left side: Secondary | ICD-10-CM | POA: Diagnosis not present

## 2016-03-01 DIAGNOSIS — M5136 Other intervertebral disc degeneration, lumbar region: Secondary | ICD-10-CM | POA: Diagnosis not present

## 2016-03-03 DIAGNOSIS — M5432 Sciatica, left side: Secondary | ICD-10-CM | POA: Diagnosis not present

## 2016-03-03 DIAGNOSIS — M9905 Segmental and somatic dysfunction of pelvic region: Secondary | ICD-10-CM | POA: Diagnosis not present

## 2016-03-03 DIAGNOSIS — M5136 Other intervertebral disc degeneration, lumbar region: Secondary | ICD-10-CM | POA: Diagnosis not present

## 2016-03-03 DIAGNOSIS — M9903 Segmental and somatic dysfunction of lumbar region: Secondary | ICD-10-CM | POA: Diagnosis not present

## 2016-03-05 DIAGNOSIS — M5136 Other intervertebral disc degeneration, lumbar region: Secondary | ICD-10-CM | POA: Diagnosis not present

## 2016-03-05 DIAGNOSIS — M5432 Sciatica, left side: Secondary | ICD-10-CM | POA: Diagnosis not present

## 2016-03-05 DIAGNOSIS — M9905 Segmental and somatic dysfunction of pelvic region: Secondary | ICD-10-CM | POA: Diagnosis not present

## 2016-03-05 DIAGNOSIS — M9903 Segmental and somatic dysfunction of lumbar region: Secondary | ICD-10-CM | POA: Diagnosis not present

## 2016-03-08 DIAGNOSIS — M5136 Other intervertebral disc degeneration, lumbar region: Secondary | ICD-10-CM | POA: Diagnosis not present

## 2016-03-08 DIAGNOSIS — M5432 Sciatica, left side: Secondary | ICD-10-CM | POA: Diagnosis not present

## 2016-03-08 DIAGNOSIS — M9903 Segmental and somatic dysfunction of lumbar region: Secondary | ICD-10-CM | POA: Diagnosis not present

## 2016-03-08 DIAGNOSIS — M9905 Segmental and somatic dysfunction of pelvic region: Secondary | ICD-10-CM | POA: Diagnosis not present

## 2016-03-09 DIAGNOSIS — M5136 Other intervertebral disc degeneration, lumbar region: Secondary | ICD-10-CM | POA: Diagnosis not present

## 2016-03-09 DIAGNOSIS — M5432 Sciatica, left side: Secondary | ICD-10-CM | POA: Diagnosis not present

## 2016-03-09 DIAGNOSIS — M9903 Segmental and somatic dysfunction of lumbar region: Secondary | ICD-10-CM | POA: Diagnosis not present

## 2016-03-09 DIAGNOSIS — M9905 Segmental and somatic dysfunction of pelvic region: Secondary | ICD-10-CM | POA: Diagnosis not present

## 2016-03-15 DIAGNOSIS — M9905 Segmental and somatic dysfunction of pelvic region: Secondary | ICD-10-CM | POA: Diagnosis not present

## 2016-03-15 DIAGNOSIS — M9903 Segmental and somatic dysfunction of lumbar region: Secondary | ICD-10-CM | POA: Diagnosis not present

## 2016-03-15 DIAGNOSIS — M5432 Sciatica, left side: Secondary | ICD-10-CM | POA: Diagnosis not present

## 2016-03-15 DIAGNOSIS — M5136 Other intervertebral disc degeneration, lumbar region: Secondary | ICD-10-CM | POA: Diagnosis not present

## 2016-03-17 DIAGNOSIS — M9905 Segmental and somatic dysfunction of pelvic region: Secondary | ICD-10-CM | POA: Diagnosis not present

## 2016-03-17 DIAGNOSIS — M9903 Segmental and somatic dysfunction of lumbar region: Secondary | ICD-10-CM | POA: Diagnosis not present

## 2016-03-17 DIAGNOSIS — M5432 Sciatica, left side: Secondary | ICD-10-CM | POA: Diagnosis not present

## 2016-03-17 DIAGNOSIS — M5136 Other intervertebral disc degeneration, lumbar region: Secondary | ICD-10-CM | POA: Diagnosis not present

## 2016-03-19 DIAGNOSIS — M5432 Sciatica, left side: Secondary | ICD-10-CM | POA: Diagnosis not present

## 2016-03-19 DIAGNOSIS — M5136 Other intervertebral disc degeneration, lumbar region: Secondary | ICD-10-CM | POA: Diagnosis not present

## 2016-03-19 DIAGNOSIS — M9905 Segmental and somatic dysfunction of pelvic region: Secondary | ICD-10-CM | POA: Diagnosis not present

## 2016-03-19 DIAGNOSIS — M9903 Segmental and somatic dysfunction of lumbar region: Secondary | ICD-10-CM | POA: Diagnosis not present

## 2016-03-22 DIAGNOSIS — M5136 Other intervertebral disc degeneration, lumbar region: Secondary | ICD-10-CM | POA: Diagnosis not present

## 2016-03-22 DIAGNOSIS — M9903 Segmental and somatic dysfunction of lumbar region: Secondary | ICD-10-CM | POA: Diagnosis not present

## 2016-03-22 DIAGNOSIS — M9905 Segmental and somatic dysfunction of pelvic region: Secondary | ICD-10-CM | POA: Diagnosis not present

## 2016-03-22 DIAGNOSIS — M5432 Sciatica, left side: Secondary | ICD-10-CM | POA: Diagnosis not present

## 2016-03-24 DIAGNOSIS — M5432 Sciatica, left side: Secondary | ICD-10-CM | POA: Diagnosis not present

## 2016-03-24 DIAGNOSIS — M9905 Segmental and somatic dysfunction of pelvic region: Secondary | ICD-10-CM | POA: Diagnosis not present

## 2016-03-24 DIAGNOSIS — M5136 Other intervertebral disc degeneration, lumbar region: Secondary | ICD-10-CM | POA: Diagnosis not present

## 2016-03-24 DIAGNOSIS — M9903 Segmental and somatic dysfunction of lumbar region: Secondary | ICD-10-CM | POA: Diagnosis not present

## 2016-03-30 DIAGNOSIS — M5136 Other intervertebral disc degeneration, lumbar region: Secondary | ICD-10-CM | POA: Diagnosis not present

## 2016-03-30 DIAGNOSIS — M9903 Segmental and somatic dysfunction of lumbar region: Secondary | ICD-10-CM | POA: Diagnosis not present

## 2016-03-30 DIAGNOSIS — M5432 Sciatica, left side: Secondary | ICD-10-CM | POA: Diagnosis not present

## 2016-03-30 DIAGNOSIS — M9905 Segmental and somatic dysfunction of pelvic region: Secondary | ICD-10-CM | POA: Diagnosis not present

## 2016-03-31 DIAGNOSIS — M5136 Other intervertebral disc degeneration, lumbar region: Secondary | ICD-10-CM | POA: Diagnosis not present

## 2016-03-31 DIAGNOSIS — M9903 Segmental and somatic dysfunction of lumbar region: Secondary | ICD-10-CM | POA: Diagnosis not present

## 2016-03-31 DIAGNOSIS — M9905 Segmental and somatic dysfunction of pelvic region: Secondary | ICD-10-CM | POA: Diagnosis not present

## 2016-03-31 DIAGNOSIS — M5432 Sciatica, left side: Secondary | ICD-10-CM | POA: Diagnosis not present

## 2016-04-05 DIAGNOSIS — M5432 Sciatica, left side: Secondary | ICD-10-CM | POA: Diagnosis not present

## 2016-04-05 DIAGNOSIS — M5136 Other intervertebral disc degeneration, lumbar region: Secondary | ICD-10-CM | POA: Diagnosis not present

## 2016-04-05 DIAGNOSIS — M9903 Segmental and somatic dysfunction of lumbar region: Secondary | ICD-10-CM | POA: Diagnosis not present

## 2016-04-05 DIAGNOSIS — M9905 Segmental and somatic dysfunction of pelvic region: Secondary | ICD-10-CM | POA: Diagnosis not present

## 2016-04-07 DIAGNOSIS — M5432 Sciatica, left side: Secondary | ICD-10-CM | POA: Diagnosis not present

## 2016-04-07 DIAGNOSIS — M9903 Segmental and somatic dysfunction of lumbar region: Secondary | ICD-10-CM | POA: Diagnosis not present

## 2016-04-07 DIAGNOSIS — M5136 Other intervertebral disc degeneration, lumbar region: Secondary | ICD-10-CM | POA: Diagnosis not present

## 2016-04-07 DIAGNOSIS — M9905 Segmental and somatic dysfunction of pelvic region: Secondary | ICD-10-CM | POA: Diagnosis not present

## 2016-04-12 DIAGNOSIS — M5432 Sciatica, left side: Secondary | ICD-10-CM | POA: Diagnosis not present

## 2016-04-12 DIAGNOSIS — M9903 Segmental and somatic dysfunction of lumbar region: Secondary | ICD-10-CM | POA: Diagnosis not present

## 2016-04-12 DIAGNOSIS — M9905 Segmental and somatic dysfunction of pelvic region: Secondary | ICD-10-CM | POA: Diagnosis not present

## 2016-04-12 DIAGNOSIS — M5136 Other intervertebral disc degeneration, lumbar region: Secondary | ICD-10-CM | POA: Diagnosis not present

## 2016-04-14 DIAGNOSIS — M5136 Other intervertebral disc degeneration, lumbar region: Secondary | ICD-10-CM | POA: Diagnosis not present

## 2016-04-14 DIAGNOSIS — M9905 Segmental and somatic dysfunction of pelvic region: Secondary | ICD-10-CM | POA: Diagnosis not present

## 2016-04-14 DIAGNOSIS — M5432 Sciatica, left side: Secondary | ICD-10-CM | POA: Diagnosis not present

## 2016-04-14 DIAGNOSIS — M9903 Segmental and somatic dysfunction of lumbar region: Secondary | ICD-10-CM | POA: Diagnosis not present

## 2016-04-20 DIAGNOSIS — M5136 Other intervertebral disc degeneration, lumbar region: Secondary | ICD-10-CM | POA: Diagnosis not present

## 2016-04-20 DIAGNOSIS — M9905 Segmental and somatic dysfunction of pelvic region: Secondary | ICD-10-CM | POA: Diagnosis not present

## 2016-04-20 DIAGNOSIS — M9903 Segmental and somatic dysfunction of lumbar region: Secondary | ICD-10-CM | POA: Diagnosis not present

## 2016-04-20 DIAGNOSIS — M5432 Sciatica, left side: Secondary | ICD-10-CM | POA: Diagnosis not present

## 2016-04-27 DIAGNOSIS — M5136 Other intervertebral disc degeneration, lumbar region: Secondary | ICD-10-CM | POA: Diagnosis not present

## 2016-04-27 DIAGNOSIS — M9903 Segmental and somatic dysfunction of lumbar region: Secondary | ICD-10-CM | POA: Diagnosis not present

## 2016-04-27 DIAGNOSIS — M5432 Sciatica, left side: Secondary | ICD-10-CM | POA: Diagnosis not present

## 2016-04-27 DIAGNOSIS — M9905 Segmental and somatic dysfunction of pelvic region: Secondary | ICD-10-CM | POA: Diagnosis not present

## 2016-05-17 DIAGNOSIS — L821 Other seborrheic keratosis: Secondary | ICD-10-CM | POA: Diagnosis not present

## 2016-05-17 DIAGNOSIS — D229 Melanocytic nevi, unspecified: Secondary | ICD-10-CM | POA: Diagnosis not present

## 2016-05-17 DIAGNOSIS — D1800 Hemangioma unspecified site: Secondary | ICD-10-CM | POA: Diagnosis not present

## 2016-05-17 DIAGNOSIS — L570 Actinic keratosis: Secondary | ICD-10-CM | POA: Diagnosis not present

## 2016-05-17 DIAGNOSIS — L812 Freckles: Secondary | ICD-10-CM | POA: Diagnosis not present

## 2016-05-17 DIAGNOSIS — L700 Acne vulgaris: Secondary | ICD-10-CM | POA: Diagnosis not present

## 2016-05-17 DIAGNOSIS — Z1283 Encounter for screening for malignant neoplasm of skin: Secondary | ICD-10-CM | POA: Diagnosis not present

## 2016-05-18 DIAGNOSIS — M9905 Segmental and somatic dysfunction of pelvic region: Secondary | ICD-10-CM | POA: Diagnosis not present

## 2016-05-18 DIAGNOSIS — M9903 Segmental and somatic dysfunction of lumbar region: Secondary | ICD-10-CM | POA: Diagnosis not present

## 2016-05-18 DIAGNOSIS — M5136 Other intervertebral disc degeneration, lumbar region: Secondary | ICD-10-CM | POA: Diagnosis not present

## 2016-05-18 DIAGNOSIS — M5432 Sciatica, left side: Secondary | ICD-10-CM | POA: Diagnosis not present

## 2016-06-15 DIAGNOSIS — M9905 Segmental and somatic dysfunction of pelvic region: Secondary | ICD-10-CM | POA: Diagnosis not present

## 2016-06-15 DIAGNOSIS — M9903 Segmental and somatic dysfunction of lumbar region: Secondary | ICD-10-CM | POA: Diagnosis not present

## 2016-06-15 DIAGNOSIS — M5432 Sciatica, left side: Secondary | ICD-10-CM | POA: Diagnosis not present

## 2016-06-15 DIAGNOSIS — M5136 Other intervertebral disc degeneration, lumbar region: Secondary | ICD-10-CM | POA: Diagnosis not present

## 2016-08-10 DIAGNOSIS — L570 Actinic keratosis: Secondary | ICD-10-CM | POA: Diagnosis not present

## 2016-08-19 DIAGNOSIS — Z7984 Long term (current) use of oral hypoglycemic drugs: Secondary | ICD-10-CM | POA: Diagnosis not present

## 2016-08-19 DIAGNOSIS — E7800 Pure hypercholesterolemia, unspecified: Secondary | ICD-10-CM | POA: Diagnosis not present

## 2016-08-19 DIAGNOSIS — H524 Presbyopia: Secondary | ICD-10-CM | POA: Diagnosis not present

## 2016-08-19 DIAGNOSIS — E119 Type 2 diabetes mellitus without complications: Secondary | ICD-10-CM | POA: Diagnosis not present

## 2016-08-19 DIAGNOSIS — D509 Iron deficiency anemia, unspecified: Secondary | ICD-10-CM | POA: Diagnosis not present

## 2016-08-19 DIAGNOSIS — H5203 Hypermetropia, bilateral: Secondary | ICD-10-CM | POA: Diagnosis not present

## 2016-08-19 DIAGNOSIS — H2513 Age-related nuclear cataract, bilateral: Secondary | ICD-10-CM | POA: Diagnosis not present

## 2016-08-19 DIAGNOSIS — I10 Essential (primary) hypertension: Secondary | ICD-10-CM | POA: Diagnosis not present

## 2016-08-23 DIAGNOSIS — E7800 Pure hypercholesterolemia, unspecified: Secondary | ICD-10-CM | POA: Diagnosis not present

## 2016-08-23 DIAGNOSIS — Z1389 Encounter for screening for other disorder: Secondary | ICD-10-CM | POA: Diagnosis not present

## 2016-08-23 DIAGNOSIS — Z0000 Encounter for general adult medical examination without abnormal findings: Secondary | ICD-10-CM | POA: Diagnosis not present

## 2016-08-23 DIAGNOSIS — D509 Iron deficiency anemia, unspecified: Secondary | ICD-10-CM | POA: Diagnosis not present

## 2016-08-23 DIAGNOSIS — I10 Essential (primary) hypertension: Secondary | ICD-10-CM | POA: Diagnosis not present

## 2016-08-23 DIAGNOSIS — E119 Type 2 diabetes mellitus without complications: Secondary | ICD-10-CM | POA: Diagnosis not present

## 2016-08-23 DIAGNOSIS — N400 Enlarged prostate without lower urinary tract symptoms: Secondary | ICD-10-CM | POA: Diagnosis not present

## 2016-08-30 DIAGNOSIS — M5432 Sciatica, left side: Secondary | ICD-10-CM | POA: Diagnosis not present

## 2016-08-30 DIAGNOSIS — M5136 Other intervertebral disc degeneration, lumbar region: Secondary | ICD-10-CM | POA: Diagnosis not present

## 2016-08-30 DIAGNOSIS — M9905 Segmental and somatic dysfunction of pelvic region: Secondary | ICD-10-CM | POA: Diagnosis not present

## 2016-08-30 DIAGNOSIS — M9903 Segmental and somatic dysfunction of lumbar region: Secondary | ICD-10-CM | POA: Diagnosis not present

## 2016-09-01 DIAGNOSIS — M9903 Segmental and somatic dysfunction of lumbar region: Secondary | ICD-10-CM | POA: Diagnosis not present

## 2016-09-01 DIAGNOSIS — M5136 Other intervertebral disc degeneration, lumbar region: Secondary | ICD-10-CM | POA: Diagnosis not present

## 2016-09-01 DIAGNOSIS — M5432 Sciatica, left side: Secondary | ICD-10-CM | POA: Diagnosis not present

## 2016-09-01 DIAGNOSIS — M9905 Segmental and somatic dysfunction of pelvic region: Secondary | ICD-10-CM | POA: Diagnosis not present

## 2016-09-03 DIAGNOSIS — M9903 Segmental and somatic dysfunction of lumbar region: Secondary | ICD-10-CM | POA: Diagnosis not present

## 2016-09-03 DIAGNOSIS — M9905 Segmental and somatic dysfunction of pelvic region: Secondary | ICD-10-CM | POA: Diagnosis not present

## 2016-09-03 DIAGNOSIS — M5432 Sciatica, left side: Secondary | ICD-10-CM | POA: Diagnosis not present

## 2016-09-03 DIAGNOSIS — M5136 Other intervertebral disc degeneration, lumbar region: Secondary | ICD-10-CM | POA: Diagnosis not present

## 2016-09-06 DIAGNOSIS — M5136 Other intervertebral disc degeneration, lumbar region: Secondary | ICD-10-CM | POA: Diagnosis not present

## 2016-09-06 DIAGNOSIS — M9903 Segmental and somatic dysfunction of lumbar region: Secondary | ICD-10-CM | POA: Diagnosis not present

## 2016-09-06 DIAGNOSIS — M9905 Segmental and somatic dysfunction of pelvic region: Secondary | ICD-10-CM | POA: Diagnosis not present

## 2016-09-06 DIAGNOSIS — M5432 Sciatica, left side: Secondary | ICD-10-CM | POA: Diagnosis not present

## 2016-09-08 DIAGNOSIS — M9905 Segmental and somatic dysfunction of pelvic region: Secondary | ICD-10-CM | POA: Diagnosis not present

## 2016-09-08 DIAGNOSIS — M5136 Other intervertebral disc degeneration, lumbar region: Secondary | ICD-10-CM | POA: Diagnosis not present

## 2016-09-08 DIAGNOSIS — M9903 Segmental and somatic dysfunction of lumbar region: Secondary | ICD-10-CM | POA: Diagnosis not present

## 2016-09-08 DIAGNOSIS — M5432 Sciatica, left side: Secondary | ICD-10-CM | POA: Diagnosis not present

## 2016-09-10 DIAGNOSIS — M9903 Segmental and somatic dysfunction of lumbar region: Secondary | ICD-10-CM | POA: Diagnosis not present

## 2016-09-10 DIAGNOSIS — M5136 Other intervertebral disc degeneration, lumbar region: Secondary | ICD-10-CM | POA: Diagnosis not present

## 2016-09-10 DIAGNOSIS — M9905 Segmental and somatic dysfunction of pelvic region: Secondary | ICD-10-CM | POA: Diagnosis not present

## 2016-09-10 DIAGNOSIS — M5432 Sciatica, left side: Secondary | ICD-10-CM | POA: Diagnosis not present

## 2016-09-13 DIAGNOSIS — M5136 Other intervertebral disc degeneration, lumbar region: Secondary | ICD-10-CM | POA: Diagnosis not present

## 2016-09-13 DIAGNOSIS — M9905 Segmental and somatic dysfunction of pelvic region: Secondary | ICD-10-CM | POA: Diagnosis not present

## 2016-09-13 DIAGNOSIS — M9903 Segmental and somatic dysfunction of lumbar region: Secondary | ICD-10-CM | POA: Diagnosis not present

## 2016-09-13 DIAGNOSIS — M5432 Sciatica, left side: Secondary | ICD-10-CM | POA: Diagnosis not present

## 2016-09-15 DIAGNOSIS — M9905 Segmental and somatic dysfunction of pelvic region: Secondary | ICD-10-CM | POA: Diagnosis not present

## 2016-09-15 DIAGNOSIS — M5136 Other intervertebral disc degeneration, lumbar region: Secondary | ICD-10-CM | POA: Diagnosis not present

## 2016-09-15 DIAGNOSIS — M5432 Sciatica, left side: Secondary | ICD-10-CM | POA: Diagnosis not present

## 2016-09-15 DIAGNOSIS — M9903 Segmental and somatic dysfunction of lumbar region: Secondary | ICD-10-CM | POA: Diagnosis not present

## 2016-09-20 DIAGNOSIS — M5136 Other intervertebral disc degeneration, lumbar region: Secondary | ICD-10-CM | POA: Diagnosis not present

## 2016-09-20 DIAGNOSIS — M9905 Segmental and somatic dysfunction of pelvic region: Secondary | ICD-10-CM | POA: Diagnosis not present

## 2016-09-20 DIAGNOSIS — M9903 Segmental and somatic dysfunction of lumbar region: Secondary | ICD-10-CM | POA: Diagnosis not present

## 2016-09-20 DIAGNOSIS — M5432 Sciatica, left side: Secondary | ICD-10-CM | POA: Diagnosis not present

## 2016-09-23 DIAGNOSIS — Z125 Encounter for screening for malignant neoplasm of prostate: Secondary | ICD-10-CM | POA: Diagnosis not present

## 2016-09-28 DIAGNOSIS — N401 Enlarged prostate with lower urinary tract symptoms: Secondary | ICD-10-CM | POA: Diagnosis not present

## 2016-09-28 DIAGNOSIS — R351 Nocturia: Secondary | ICD-10-CM | POA: Diagnosis not present

## 2016-11-18 DIAGNOSIS — L570 Actinic keratosis: Secondary | ICD-10-CM | POA: Diagnosis not present

## 2016-11-18 DIAGNOSIS — L578 Other skin changes due to chronic exposure to nonionizing radiation: Secondary | ICD-10-CM | POA: Diagnosis not present

## 2016-11-18 DIAGNOSIS — L821 Other seborrheic keratosis: Secondary | ICD-10-CM | POA: Diagnosis not present

## 2016-11-18 DIAGNOSIS — L812 Freckles: Secondary | ICD-10-CM | POA: Diagnosis not present

## 2016-11-18 DIAGNOSIS — L820 Inflamed seborrheic keratosis: Secondary | ICD-10-CM | POA: Diagnosis not present

## 2017-02-16 DIAGNOSIS — M9903 Segmental and somatic dysfunction of lumbar region: Secondary | ICD-10-CM | POA: Diagnosis not present

## 2017-02-16 DIAGNOSIS — M5432 Sciatica, left side: Secondary | ICD-10-CM | POA: Diagnosis not present

## 2017-02-16 DIAGNOSIS — M9905 Segmental and somatic dysfunction of pelvic region: Secondary | ICD-10-CM | POA: Diagnosis not present

## 2017-02-16 DIAGNOSIS — M5136 Other intervertebral disc degeneration, lumbar region: Secondary | ICD-10-CM | POA: Diagnosis not present

## 2017-02-18 DIAGNOSIS — M5136 Other intervertebral disc degeneration, lumbar region: Secondary | ICD-10-CM | POA: Diagnosis not present

## 2017-02-18 DIAGNOSIS — M791 Myalgia: Secondary | ICD-10-CM | POA: Diagnosis not present

## 2017-02-18 DIAGNOSIS — I10 Essential (primary) hypertension: Secondary | ICD-10-CM | POA: Diagnosis not present

## 2017-02-18 DIAGNOSIS — Z7984 Long term (current) use of oral hypoglycemic drugs: Secondary | ICD-10-CM | POA: Diagnosis not present

## 2017-02-18 DIAGNOSIS — M9905 Segmental and somatic dysfunction of pelvic region: Secondary | ICD-10-CM | POA: Diagnosis not present

## 2017-02-18 DIAGNOSIS — E7800 Pure hypercholesterolemia, unspecified: Secondary | ICD-10-CM | POA: Diagnosis not present

## 2017-02-18 DIAGNOSIS — E119 Type 2 diabetes mellitus without complications: Secondary | ICD-10-CM | POA: Diagnosis not present

## 2017-02-18 DIAGNOSIS — M9903 Segmental and somatic dysfunction of lumbar region: Secondary | ICD-10-CM | POA: Diagnosis not present

## 2017-02-18 DIAGNOSIS — M5432 Sciatica, left side: Secondary | ICD-10-CM | POA: Diagnosis not present

## 2017-02-18 DIAGNOSIS — N400 Enlarged prostate without lower urinary tract symptoms: Secondary | ICD-10-CM | POA: Diagnosis not present

## 2017-02-21 DIAGNOSIS — L578 Other skin changes due to chronic exposure to nonionizing radiation: Secondary | ICD-10-CM | POA: Diagnosis not present

## 2017-02-21 DIAGNOSIS — L821 Other seborrheic keratosis: Secondary | ICD-10-CM | POA: Diagnosis not present

## 2017-02-21 DIAGNOSIS — D229 Melanocytic nevi, unspecified: Secondary | ICD-10-CM | POA: Diagnosis not present

## 2017-02-21 DIAGNOSIS — L570 Actinic keratosis: Secondary | ICD-10-CM | POA: Diagnosis not present

## 2017-02-21 DIAGNOSIS — L812 Freckles: Secondary | ICD-10-CM | POA: Diagnosis not present

## 2017-04-27 DIAGNOSIS — R05 Cough: Secondary | ICD-10-CM | POA: Diagnosis not present

## 2017-05-19 DIAGNOSIS — J9801 Acute bronchospasm: Secondary | ICD-10-CM | POA: Diagnosis not present

## 2017-06-09 DIAGNOSIS — E7800 Pure hypercholesterolemia, unspecified: Secondary | ICD-10-CM | POA: Diagnosis not present

## 2017-06-22 DIAGNOSIS — M5432 Sciatica, left side: Secondary | ICD-10-CM | POA: Diagnosis not present

## 2017-06-22 DIAGNOSIS — M9905 Segmental and somatic dysfunction of pelvic region: Secondary | ICD-10-CM | POA: Diagnosis not present

## 2017-06-22 DIAGNOSIS — M9903 Segmental and somatic dysfunction of lumbar region: Secondary | ICD-10-CM | POA: Diagnosis not present

## 2017-06-22 DIAGNOSIS — M5136 Other intervertebral disc degeneration, lumbar region: Secondary | ICD-10-CM | POA: Diagnosis not present

## 2017-06-23 DIAGNOSIS — R05 Cough: Secondary | ICD-10-CM | POA: Diagnosis not present

## 2017-07-05 DIAGNOSIS — M5432 Sciatica, left side: Secondary | ICD-10-CM | POA: Diagnosis not present

## 2017-07-05 DIAGNOSIS — M9905 Segmental and somatic dysfunction of pelvic region: Secondary | ICD-10-CM | POA: Diagnosis not present

## 2017-07-05 DIAGNOSIS — M5136 Other intervertebral disc degeneration, lumbar region: Secondary | ICD-10-CM | POA: Diagnosis not present

## 2017-07-05 DIAGNOSIS — M9903 Segmental and somatic dysfunction of lumbar region: Secondary | ICD-10-CM | POA: Diagnosis not present

## 2017-07-08 ENCOUNTER — Ambulatory Visit: Payer: PPO | Admitting: Internal Medicine

## 2017-07-08 ENCOUNTER — Encounter: Payer: Self-pay | Admitting: Internal Medicine

## 2017-07-08 VITALS — BP 130/78 | HR 87 | Ht 69.0 in | Wt 177.8 lb

## 2017-07-08 DIAGNOSIS — Z87891 Personal history of nicotine dependence: Secondary | ICD-10-CM | POA: Diagnosis not present

## 2017-07-08 DIAGNOSIS — R05 Cough: Secondary | ICD-10-CM | POA: Diagnosis not present

## 2017-07-08 DIAGNOSIS — R053 Chronic cough: Secondary | ICD-10-CM

## 2017-07-08 MED ORDER — MOMETASONE FUROATE 220 MCG/INH IN AEPB: 2.0000 | INHALATION_SPRAY | Freq: Every day | RESPIRATORY_TRACT | 0 refills | Status: DC

## 2017-07-08 MED ORDER — PREDNISONE 10 MG PO TABS: ORAL_TABLET | ORAL | 0 refills | Status: DC

## 2017-07-08 MED ORDER — MOMETASONE FUROATE 220 MCG/INH IN AEPB: 2.0000 | INHALATION_SPRAY | Freq: Every day | RESPIRATORY_TRACT | 12 refills | Status: DC

## 2017-07-08 NOTE — Progress Notes (Signed)
Subjective:    Patient ID: Isaac Weber, male    DOB: 28-Sep-1945, 71 y.o.   MRN: [id number] PCP Leighton Ruff, MD  HPI    IOV  07/08/2017  Chief Complaint  Patient presents with  . Advice Only    Referred by Dr. Drema Dallas for cough x71months.  Pt states that his cough is  a dry cough. Pt was put on nexium x2 weeks for acid reflux which he states has loosened the mucus some.      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  He tells me that mid September 2018 he went to the beach.  Upon return from the beach his granddaughter was sick with a cough and subsequently on April 16, 2017 he abruptly developed a cough.  Since then the cough has persisted.  The only thing that has improved and his cough is that he is no longer having significant night cough although he still does have some  amount of night cough.  This night cough resolved after opioid cough syrup.  Cough is made worse by talking, laughing and also randomly.  It is associated with clearing of the throat.  He feels a tightness in the upper chest.  There is no associated wheezing but there is still some residual nocturnal symptoms.  There is no associated acid reflux.  He has been on Nexium for a week or 2 without much relief.  He has not tried anything for nasal but he denies any nasal discharge.  He is not on any ACE inhibitors.  He does not have a previous history of asthma but his exam nitric oxide today is elevated borderline at 44 ppb.  He does clear the throat.  The cough is annoying.  He tells me that he did have a chest x-ray with his primary care physician that was clear.  The history is obtained from him, talking to his wife and review of the primary care physician referral notes.  Lab review she had a hemoglobin 11.6 g% in February 2016 and a eosinophils f 300 cells.  He is a former smoker  He does not have any shortness of breath.  He walks over a mile.  Sometimes he  will cough and a pro-air can help him.  He does find the [employer] helps him.  At this point in time he prefers for conservative simplistic line of treatment    Dr Lorenza Cambridge Reflux Symptom Index (> 13-15 suggestive of LPR cough) 0 -> 5  =  none ->severe problem.td 07/08/2017   Hoarseness of problem with voice 1  Clearing  Of Throat 3  Excess throat mucus or feeling of post nasal drip 1  Difficulty swallowing food, liquid or tablets 0  Cough after eating or lying down 4  Breathing difficulties or choking episodes 0  Troublesome or annoying cough 5  Sensation of something sticking in throat or lump in throat 0  Heartburn, chest pain, indigestion, or stomach acid coming up 0  TOTAL 14     feno 43 ppb   Results for LEE, KUANG (MRN [id number]) as of 07/08/2017 10:59  Ref. Range 02/10/2009 08:35 09/16/2014 13:54  Eosinophils Absolute Latest Ref Range: 0.0 - 0.5 10e3/uL  0.3      has a past medical history of BPH (benign prostatic hyperplasia), Diabetes mellitus without complication ([place]), and Hypertension.   reports that he quit smoking about 45 years ago. His smoking use  included cigarettes. He has a 42.00 pack-year smoking history. he has never used smokeless tobacco.  Past Surgical History:  Procedure Laterality Date  . BUNIONECTOMY    . KNEE ARTHROSCOPY      Allergies  Allergen Reactions  . Ciprofloxacin   . Morphine And Related      There is no immunization history on file for this patient.  Family History  Family history unknown: Yes     Current Outpatient Medications:  .  amLODipine (NORVASC) 5 MG tablet, Take 5 mg by mouth daily., Disp: , Rfl:  .  benzonatate (TESSALON) 200 MG capsule, TK ONE C PO UP TO BID DURING DAYTIME PRN, Disp: , Rfl: 0 .  Calcium Carb-Cholecalciferol (CALCIUM + D3 PO), Take by mouth., Disp: , Rfl:  .  Glucosamine-Chondroitin (GLUCOSAMINE CHONDR COMPLEX PO), Take by mouth 2 (two) times daily. , Disp: , Rfl:  .  ibuprofen  (ADVIL,MOTRIN) 200 MG tablet, Take 200 mg by mouth every 6 (six) hours as needed., Disp: , Rfl:  .  losartan-hydrochlorothiazide (HYZAAR) 50-12.5 MG per tablet, Take 1 tablet by mouth daily., Disp: , Rfl:  .  metFORMIN (GLUCOPHAGE) 500 MG tablet, Take by mouth 2 (two) times daily with a meal., Disp: , Rfl:  .  PROAIR HFA 108 (90 Base) MCG/ACT inhaler, INL 2 PFS PO INTO THE LUNGS BID PRN, Disp: , Rfl: 0 .  rosuvastatin (CRESTOR) 5 MG tablet, , Disp: , Rfl:     Review of Systems  Constitutional: Negative for fever and unexpected weight change.  HENT: Positive for sneezing. Negative for congestion, dental problem, ear pain, nosebleeds, postnasal drip, rhinorrhea, sinus pressure, sore throat and trouble swallowing.   Eyes: Negative for redness and itching.  Respiratory: Positive for cough and shortness of breath. Negative for chest tightness and wheezing.   Cardiovascular: Negative for palpitations and leg swelling.  Gastrointestinal: Negative for nausea and vomiting.  Genitourinary: Negative for dysuria.  Musculoskeletal: Negative for joint swelling.  Skin: Negative for rash.  Allergic/Immunologic: Negative.  Negative for environmental allergies, food allergies and immunocompromised state.  Neurological: Negative for headaches.  Hematological: Does not bruise/bleed easily.  Psychiatric/Behavioral: Negative for dysphoric mood. The patient is not nervous/anxious.        Objective:   Physical Exam Vitals:   07/08/17 1033  BP: 130/78  Pulse: 87  SpO2: 98%  Weight: 177 lb 12.8 oz (80.6 kg)  Height: 5\' 9"  (1.753 m)    Estimated body mass index is 26.26 kg/m as calculated from the following:   Height as of this encounter: 5\' 9"  (1.753 m).   Weight as of this encounter: 177 lb 12.8 oz (80.6 kg).        Assessment & Plan:     ICD-10-CM   1. Chronic cough R05   2. Former smoker, stopped smoking in distant past Z87.891     Cough could be from cough varian asthma Whether there  is underlying sinus drainage, and silent acid reflux making cough worse unclear All of this is I think is  working together to cause cyclical cough/LPR cough or called cough neuropathy WE will treat the  Possible asthma piece first  #Possible Sinus drainage  -start nasal steroid generic fluticasone inhaler 2 squirts each nostril daily as advised    #Possible Acid Reflux  -continue your acid reflux treatment nexium for now  - At all times avoid colas, spices, cheeses, spirits, red meats, beer, chocolates, fried foods etc., (to extent possible over xmas)  - sleep  with head end of bed elevated  - eat small frequent meals  - do not go to bed for 3 hours after last meal  #Possible Asthma  -  Please take prednisone 40 mg x1 day, then 30 mg x1 day, then 20 mg x1 day, then 10 mg x1 day, and then 5 mg x1 day and stop - start inhaler on scheduled daily basis asmanex 226mcg 2 puff twice daily  #Followup - flu shot next week - I will see you in 4-6 weeks; cough score at followup - any problems call or come sooner  - if cough still a problem then consider CT sinus/Chest    Dr. Brand Males, M.D., [facility].C.P Pulmonary and Critical Care Medicine Staff Physician, [place] Director - Interstitial Lung Disease  Program  Pulmonary [place] at [place], [place], [address]  Pager: [phone], If no answer or between  15:00h - 7:00h: call [phone] Telephone: [phone]

## 2017-07-08 NOTE — Patient Instructions (Addendum)
ICD-10-CM   1. Chronic cough R05   2. Former smoker, stopped smoking in distant past Z87.891    Cough could be from cough varian asthma Whether there is underlying sinus drainage, and silent acid reflux making cough worse unclear All of this is I think is  working together to cause cyclical cough/LPR cough or called cough neuropathy WE will treat the  Possible asthma piece first  #Possible Sinus drainage  -start nasal steroid generic fluticasone inhaler 2 squirts each nostril daily as advised    #Possible Acid Reflux  -continue your acid reflux treatment nexium for now  - At all times avoid colas, spices, cheeses, spirits, red meats, beer, chocolates, fried foods etc., (to extent possible over xmas)  - sleep with head end of bed elevated  - eat small frequent meals  - do not go to bed for 3 hours after last meal  #Possible Asthma  -  Please take prednisone 40 mg x1 day, then 30 mg x1 day, then 20 mg x1 day, then 10 mg x1 day, and then 5 mg x1 day and stop - start inhaler on scheduled daily basis asmanex 251mcg 2 puff twice daily  #Followup - flu shot next week - I will see you in 4-6 weeks; cough score at followup - any problems call or come sooner  - if cough still a problem then consider CT sinus/Chest/pulmonary function test

## 2017-08-09 ENCOUNTER — Encounter: Payer: Self-pay | Admitting: Internal Medicine

## 2017-08-09 NOTE — Telephone Encounter (Signed)
Pt sent email for [facility], I would like to take this chance to let you know how my treatment is going prior to my appointment. I hate when I walk out of the Doctors office and say oh I wish I said this or that. Doctor when we spoke I had know idea I was going to go on such a strict diet, your nurse let me know your wish. One thing about a [employer] when we are told to do something we do it, so rest assured I followed your diet to the letter. It was very tough but I did it. After I finished the Prednisone for about four days my cough was pretty much gone. Then it came back with a vengeance .I've pretty much just continued the inhaler and of course the diet( thank God my wife is a wonder cook) I've been able to follow every aspect of the diet. Even though I lost weight and my blood sugar has improved I'm hoping I can go back to my regular diet. The cough has improved but I can't say it is gone, but it is not as consent. I'm not sure what has worked I hope you know.look forward seeing you the24th.  Routing to MR

## 2017-08-10 NOTE — Telephone Encounter (Signed)
Ok to go back on regular diet . Rest to be discussed on 24th visit  Dr. Brand Males, M.D., [facility].C.P Pulmonary and Critical Care Medicine Staff Physician, [place] Director - Interstitial Lung Disease  Program  Pulmonary [place] at [place], [place], [address]  Pager: [phone], If no answer or between  15:00h - 7:00h: call [phone] Telephone: [phone]

## 2017-08-18 ENCOUNTER — Ambulatory Visit: Payer: PPO | Admitting: Internal Medicine

## 2017-08-18 ENCOUNTER — Encounter: Payer: Self-pay | Admitting: Internal Medicine

## 2017-08-18 VITALS — BP 142/84 | HR 82 | Ht 69.0 in | Wt 173.2 lb

## 2017-08-18 DIAGNOSIS — Z862 Personal history of diseases of the blood and blood-forming organs and certain disorders involving the immune mechanism: Secondary | ICD-10-CM | POA: Diagnosis not present

## 2017-08-18 DIAGNOSIS — R053 Chronic cough: Secondary | ICD-10-CM

## 2017-08-18 DIAGNOSIS — E7800 Pure hypercholesterolemia, unspecified: Secondary | ICD-10-CM | POA: Diagnosis not present

## 2017-08-18 DIAGNOSIS — E119 Type 2 diabetes mellitus without complications: Secondary | ICD-10-CM | POA: Diagnosis not present

## 2017-08-18 DIAGNOSIS — R05 Cough: Secondary | ICD-10-CM

## 2017-08-18 MED ORDER — MOMETASONE FUROATE 220 MCG/INH IN AEPB: 2.0000 | INHALATION_SPRAY | Freq: Every day | RESPIRATORY_TRACT | 0 refills | Status: DC

## 2017-08-18 MED ORDER — MOMETASONE FUROATE 220 MCG/INH IN AEPB: 2.0000 | INHALATION_SPRAY | Freq: Every day | RESPIRATORY_TRACT | 12 refills | Status: DC

## 2017-08-18 NOTE — Patient Instructions (Addendum)
ICD-10-CM   1. Chronic cough R05     Glad you are a lot better You likely have cough variant asthma  Plan Stop prilosec and can slowly liberalize diet: if this is making cough worse please call us Continue asmanex 2 puff twice daily for another 3 months and then try to reduce it 1 pufff twice daily to continue  Followup 6 months for cough with Dr Chase Caller; return sooner if needed

## 2017-08-18 NOTE — Progress Notes (Signed)
Subjective:     Patient ID: Isaac Weber, male   DOB: [birth year], 72 y.o.   MRN: [id number]  HPI  PCP Leighton Ruff, MD  HPI    IOV  07/08/2017  Chief Complaint  Patient presents with  . Advice Only    Referred by Dr. Drema Dallas for cough x62months.  Pt states that his cough is  a dry cough. Pt was put on nexium x2 weeks for acid reflux which he states has loosened the mucus some.      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  He tells me that mid September 2018 he went to the beach.  Upon return from the beach his granddaughter was sick with a cough and subsequently on April 16, 2017 he abruptly developed a cough.  Since then the cough has persisted.  The only thing that has improved and his cough is that he is no longer having significant night cough although he still does have some  amount of night cough.  This night cough resolved after opioid cough syrup.  Cough is made worse by talking, laughing and also randomly.  It is associated with clearing of the throat.  He feels a tightness in the upper chest.  There is no associated wheezing but there is still some residual nocturnal symptoms.  There is no associated acid reflux.  He has been on Nexium for a week or 2 without much relief.  He has not tried anything for nasal but he denies any nasal discharge.  He is not on any ACE inhibitors.  He does not have a previous history of asthma but his exam nitric oxide today is elevated borderline at 44 ppb.  He does clear the throat.  The cough is annoying.  He tells me that he did have a chest x-ray with his primary care physician that was clear.  The history is obtained from him, talking to his wife and review of the primary care physician referral notes.  Lab review she had a hemoglobin 11.6 g% in February 2016 and a eosinophils f 300 cells.  He is a former smoker  He does not have any shortness of breath.  He walks over a mile.   Sometimes he will cough and a pro-air can help him.  He does find the [employer] helps him.  At this point in time he prefers for conservative simplistic line of treatment   OV 08/18/2017  Chief Complaint  Patient presents with  . Follow-up    Pt states he has been doing good since last visit. Cough is better.   Follow-up chronic cough  After last visit he decided to take Asmanex.  He also followed a diet and took Prilosec.  With this the cough is more than 60% better as documented in the RSI cough score.  He does not want to take any more new medications.  He feels prednisone burst helped him a lot in the initial few days.  He is wondering which of these measures have helped him the most.  There are no new issues.  He wants to liberalize his diet which he says is actually helping him with the sugars and with the cough but he would like to be less disciplined about food.  He is willing to continue with Asmanex   Dr Lorenza Cambridge Reflux Symptom Index (> 13-15 suggestive of LPR cough) 0 -> 5  =  none ->severe problem.td 07/08/2017  08/18/2017   Hoarseness of problem with voice 1 2  Clearing  Of Throat 3 2  Excess throat mucus or feeling of post nasal drip 1 0  Difficulty swallowing food, liquid or tablets 0 0  Cough after eating or lying down 4 1  Breathing difficulties or choking episodes 0 0  Troublesome or annoying cough 5 2  Sensation of something sticking in throat or lump in throat 0 0  Heartburn, chest pain, indigestion, or stomach acid coming up 0 0  TOTAL 14 7     feno 43 ppb   Results for KAMAREON, SCIANDRA (MRN [id number]) as of 07/08/2017 10:59  Ref. Range 02/10/2009 08:35 09/16/2014 13:54  Eosinophils Absolute Latest Ref Range: 0.0 - 0.5 10e3/uL  0.3      has a past medical history of BPH (benign prostatic hyperplasia), Diabetes mellitus without complication ([place]), and Hypertension.   reports that he quit smoking about 45 years ago. His smoking use included cigarettes. He  has a 42.00 pack-year smoking history. he has never used smokeless tobacco.  Past Surgical History:  Procedure Laterality Date  . BUNIONECTOMY    . KNEE ARTHROSCOPY      Allergies  Allergen Reactions  . Ciprofloxacin   . Morphine And Related     Immunization History  Administered Date(s) Administered  . Influenza, High Dose Seasonal PF 07/15/2017    Family History  Family history unknown: Yes     Current Outpatient Medications:  .  amLODipine (NORVASC) 5 MG tablet, Take 5 mg by mouth daily., Disp: , Rfl:  .  Calcium Carb-Cholecalciferol (CALCIUM + D3 PO), Take by mouth., Disp: , Rfl:  .  Glucosamine-Chondroitin (GLUCOSAMINE CHONDR COMPLEX PO), Take by mouth 2 (two) times daily. , Disp: , Rfl:  .  ibuprofen (ADVIL,MOTRIN) 200 MG tablet, Take 200 mg by mouth every 6 (six) hours as needed., Disp: , Rfl:  .  losartan-hydrochlorothiazide (HYZAAR) 50-12.5 MG per tablet, Take 1 tablet by mouth daily., Disp: , Rfl:  .  metFORMIN (GLUCOPHAGE) 500 MG tablet, Take by mouth 2 (two) times daily with a meal., Disp: , Rfl:  .  mometasone (ASMANEX 60 METERED DOSES) 220 MCG/INH inhaler, Inhale 2 puffs into the lungs daily., Disp: 1 Inhaler, Rfl: 12 .  PROAIR HFA 108 (90 Base) MCG/ACT inhaler, INL 2 PFS PO INTO THE LUNGS BID PRN, Disp: , Rfl: 0 .  rosuvastatin (CRESTOR) 5 MG tablet, , Disp: , Rfl:  .  benzonatate (TESSALON) 200 MG capsule, TK ONE C PO UP TO BID DURING DAYTIME PRN, Disp: , Rfl: 0     Review of Systems     Objective:   Physical Exam  Constitutional: He is oriented to person, place, and time. He appears well-developed and well-nourished. No distress.  HENT:  Head: Normocephalic and atraumatic.  Right Ear: External ear normal.  Left Ear: External ear normal.  Mouth/Throat: Oropharynx is clear and moist. No oropharyngeal exudate.  Eyes: Conjunctivae and EOM are normal. Pupils are equal, round, and reactive to light. Right eye exhibits no discharge. Left eye exhibits no  discharge. No scleral icterus.  Neck: Normal range of motion. Neck supple. No JVD present. No tracheal deviation present. No thyromegaly present.  Cardiovascular: Normal rate, regular rhythm and intact distal pulses. Exam reveals no gallop and no friction rub.  No murmur heard. Pulmonary/Chest: Effort normal and breath sounds normal. No respiratory distress. He has no wheezes. He has no rales. He exhibits no tenderness.  Abdominal: Soft. Bowel sounds  are normal. He exhibits no distension and no mass. There is no tenderness. There is no rebound and no guarding.  Musculoskeletal: Normal range of motion. He exhibits no edema or tenderness.  Lymphadenopathy:    He has no cervical adenopathy.  Neurological: He is alert and oriented to person, place, and time. He has normal reflexes. No cranial nerve deficit. Coordination normal.  Skin: Skin is warm and dry. No rash noted. He is not diaphoretic. No erythema. No pallor.  Psychiatric: He has a normal mood and affect. His behavior is normal. Judgment and thought content normal.  Nursing note and vitals reviewed.  Vitals:   08/18/17 1056  BP: (!) 142/84  Pulse: 82  SpO2: 95%  Weight: 173 lb 3.2 oz (78.6 kg)  Height: 5\' 9"  (1.753 m)    Estimated body mass index is 25.58 kg/m as calculated from the following:   Height as of this encounter: 5\' 9"  (1.753 m).   Weight as of this encounter: 173 lb 3.2 oz (78.6 kg).      Assessment:       ICD-10-CM   1. Chronic cough R05        Plan:       Glad you are a lot better You likely have cough variant asthma  Plan Stop prilosec and can slowly liberalize diet: if this is making cough worse please call us Continue asmanex 2 puff twice daily for another 3 months and then try to reduce it 1 pufff twice daily to continue  Followup 6 months for cough with Dr Chase Caller; return sooner if needed   Dr. Brand Males, M.D., [facility].C.P Pulmonary and Critical Care Medicine Staff Physician, [place] Director - Interstitial Lung Disease  Program  Pulmonary [place] at [place], [place], [address]  Pager: [phone], If no answer or between  15:00h - 7:00h: call [phone] Telephone: [phone]

## 2017-08-25 DIAGNOSIS — I10 Essential (primary) hypertension: Secondary | ICD-10-CM | POA: Diagnosis not present

## 2017-08-25 DIAGNOSIS — Z862 Personal history of diseases of the blood and blood-forming organs and certain disorders involving the immune mechanism: Secondary | ICD-10-CM | POA: Diagnosis not present

## 2017-08-25 DIAGNOSIS — E119 Type 2 diabetes mellitus without complications: Secondary | ICD-10-CM | POA: Diagnosis not present

## 2017-08-25 DIAGNOSIS — N400 Enlarged prostate without lower urinary tract symptoms: Secondary | ICD-10-CM | POA: Diagnosis not present

## 2017-08-25 DIAGNOSIS — Z0000 Encounter for general adult medical examination without abnormal findings: Secondary | ICD-10-CM | POA: Diagnosis not present

## 2017-08-25 DIAGNOSIS — Z1211 Encounter for screening for malignant neoplasm of colon: Secondary | ICD-10-CM | POA: Diagnosis not present

## 2017-08-25 DIAGNOSIS — E7800 Pure hypercholesterolemia, unspecified: Secondary | ICD-10-CM | POA: Diagnosis not present

## 2017-08-25 DIAGNOSIS — Z1389 Encounter for screening for other disorder: Secondary | ICD-10-CM | POA: Diagnosis not present

## 2017-09-22 DIAGNOSIS — H2513 Age-related nuclear cataract, bilateral: Secondary | ICD-10-CM | POA: Diagnosis not present

## 2017-09-22 DIAGNOSIS — H524 Presbyopia: Secondary | ICD-10-CM | POA: Diagnosis not present

## 2017-09-22 DIAGNOSIS — R972 Elevated prostate specific antigen [PSA]: Secondary | ICD-10-CM | POA: Diagnosis not present

## 2017-09-22 DIAGNOSIS — E119 Type 2 diabetes mellitus without complications: Secondary | ICD-10-CM | POA: Diagnosis not present

## 2017-09-22 DIAGNOSIS — H5203 Hypermetropia, bilateral: Secondary | ICD-10-CM | POA: Diagnosis not present

## 2017-09-29 DIAGNOSIS — N400 Enlarged prostate without lower urinary tract symptoms: Secondary | ICD-10-CM | POA: Diagnosis not present

## 2017-10-28 DIAGNOSIS — K648 Other hemorrhoids: Secondary | ICD-10-CM | POA: Diagnosis not present

## 2017-10-28 DIAGNOSIS — K5730 Diverticulosis of large intestine without perforation or abscess without bleeding: Secondary | ICD-10-CM | POA: Diagnosis not present

## 2017-10-28 DIAGNOSIS — Z1211 Encounter for screening for malignant neoplasm of colon: Secondary | ICD-10-CM | POA: Diagnosis not present

## 2017-10-28 DIAGNOSIS — D126 Benign neoplasm of colon, unspecified: Secondary | ICD-10-CM | POA: Diagnosis not present

## 2017-10-28 LAB — HM COLONOSCOPY

## 2017-11-01 DIAGNOSIS — D126 Benign neoplasm of colon, unspecified: Secondary | ICD-10-CM | POA: Diagnosis not present

## 2017-11-01 DIAGNOSIS — Z1211 Encounter for screening for malignant neoplasm of colon: Secondary | ICD-10-CM | POA: Diagnosis not present

## 2017-11-07 DIAGNOSIS — M9903 Segmental and somatic dysfunction of lumbar region: Secondary | ICD-10-CM | POA: Diagnosis not present

## 2017-11-07 DIAGNOSIS — M5136 Other intervertebral disc degeneration, lumbar region: Secondary | ICD-10-CM | POA: Diagnosis not present

## 2017-11-07 DIAGNOSIS — M9905 Segmental and somatic dysfunction of pelvic region: Secondary | ICD-10-CM | POA: Diagnosis not present

## 2017-11-07 DIAGNOSIS — M5432 Sciatica, left side: Secondary | ICD-10-CM | POA: Diagnosis not present

## 2017-11-18 DIAGNOSIS — M9905 Segmental and somatic dysfunction of pelvic region: Secondary | ICD-10-CM | POA: Diagnosis not present

## 2017-11-18 DIAGNOSIS — M5136 Other intervertebral disc degeneration, lumbar region: Secondary | ICD-10-CM | POA: Diagnosis not present

## 2017-11-18 DIAGNOSIS — M9903 Segmental and somatic dysfunction of lumbar region: Secondary | ICD-10-CM | POA: Diagnosis not present

## 2017-11-18 DIAGNOSIS — M5432 Sciatica, left side: Secondary | ICD-10-CM | POA: Diagnosis not present

## 2017-12-02 DIAGNOSIS — M5432 Sciatica, left side: Secondary | ICD-10-CM | POA: Diagnosis not present

## 2017-12-02 DIAGNOSIS — M9903 Segmental and somatic dysfunction of lumbar region: Secondary | ICD-10-CM | POA: Diagnosis not present

## 2017-12-02 DIAGNOSIS — M5136 Other intervertebral disc degeneration, lumbar region: Secondary | ICD-10-CM | POA: Diagnosis not present

## 2017-12-02 DIAGNOSIS — M9905 Segmental and somatic dysfunction of pelvic region: Secondary | ICD-10-CM | POA: Diagnosis not present

## 2017-12-23 ENCOUNTER — Encounter: Payer: Self-pay | Admitting: Internal Medicine

## 2017-12-26 NOTE — Telephone Encounter (Signed)
Mr  Isaac Weber is certainly welcome to try and see what happens to his cough without the once daily asmanex. His feno was borderline elevated at 44ppb so I am ok with just a trial off inhaled steroid

## 2017-12-26 NOTE — Telephone Encounter (Signed)
Dr. Ramaswamy - please advise. Thanks. 

## 2017-12-30 NOTE — Telephone Encounter (Signed)
Patient called back and wanted to see if someone could call him back regarding a my chart message. Cb I s [phone]

## 2018-01-17 DIAGNOSIS — M9905 Segmental and somatic dysfunction of pelvic region: Secondary | ICD-10-CM | POA: Diagnosis not present

## 2018-01-17 DIAGNOSIS — M9903 Segmental and somatic dysfunction of lumbar region: Secondary | ICD-10-CM | POA: Diagnosis not present

## 2018-01-17 DIAGNOSIS — M5432 Sciatica, left side: Secondary | ICD-10-CM | POA: Diagnosis not present

## 2018-01-17 DIAGNOSIS — M5136 Other intervertebral disc degeneration, lumbar region: Secondary | ICD-10-CM | POA: Diagnosis not present

## 2018-02-16 ENCOUNTER — Ambulatory Visit: Payer: PPO | Admitting: Internal Medicine

## 2018-02-23 DIAGNOSIS — E119 Type 2 diabetes mellitus without complications: Secondary | ICD-10-CM | POA: Diagnosis not present

## 2018-02-23 DIAGNOSIS — Z862 Personal history of diseases of the blood and blood-forming organs and certain disorders involving the immune mechanism: Secondary | ICD-10-CM | POA: Diagnosis not present

## 2018-02-23 DIAGNOSIS — Z7984 Long term (current) use of oral hypoglycemic drugs: Secondary | ICD-10-CM | POA: Diagnosis not present

## 2018-02-23 DIAGNOSIS — E7800 Pure hypercholesterolemia, unspecified: Secondary | ICD-10-CM | POA: Diagnosis not present

## 2018-02-23 DIAGNOSIS — I10 Essential (primary) hypertension: Secondary | ICD-10-CM | POA: Diagnosis not present

## 2018-02-27 DIAGNOSIS — D2230 Melanocytic nevi of unspecified part of face: Secondary | ICD-10-CM | POA: Diagnosis not present

## 2018-02-27 DIAGNOSIS — B351 Tinea unguium: Secondary | ICD-10-CM | POA: Diagnosis not present

## 2018-02-27 DIAGNOSIS — L570 Actinic keratosis: Secondary | ICD-10-CM | POA: Diagnosis not present

## 2018-02-27 DIAGNOSIS — L820 Inflamed seborrheic keratosis: Secondary | ICD-10-CM | POA: Diagnosis not present

## 2018-02-27 DIAGNOSIS — Z1283 Encounter for screening for malignant neoplasm of skin: Secondary | ICD-10-CM | POA: Diagnosis not present

## 2018-02-27 DIAGNOSIS — D225 Melanocytic nevi of trunk: Secondary | ICD-10-CM | POA: Diagnosis not present

## 2018-02-27 DIAGNOSIS — L219 Seborrheic dermatitis, unspecified: Secondary | ICD-10-CM | POA: Diagnosis not present

## 2018-02-27 DIAGNOSIS — L821 Other seborrheic keratosis: Secondary | ICD-10-CM | POA: Diagnosis not present

## 2018-02-27 DIAGNOSIS — D229 Melanocytic nevi, unspecified: Secondary | ICD-10-CM | POA: Diagnosis not present

## 2018-02-27 DIAGNOSIS — L812 Freckles: Secondary | ICD-10-CM | POA: Diagnosis not present

## 2018-02-28 ENCOUNTER — Ambulatory Visit (INDEPENDENT_AMBULATORY_CARE_PROVIDER_SITE_OTHER)
Admission: RE | Admit: 2018-02-28 | Discharge: 2018-02-28 | Disposition: A | Discharge status: Home / Self Care | Payer: PPO | Source: Ambulatory Visit | Attending: Internal Medicine | Admitting: Internal Medicine

## 2018-02-28 ENCOUNTER — Ambulatory Visit (INDEPENDENT_AMBULATORY_CARE_PROVIDER_SITE_OTHER): Payer: PPO | Admitting: Internal Medicine

## 2018-02-28 ENCOUNTER — Encounter: Payer: Self-pay | Admitting: Internal Medicine

## 2018-02-28 VITALS — BP 122/70 | HR 90

## 2018-02-28 DIAGNOSIS — R05 Cough: Secondary | ICD-10-CM

## 2018-02-28 DIAGNOSIS — J45991 Cough variant asthma: Secondary | ICD-10-CM | POA: Diagnosis not present

## 2018-02-28 DIAGNOSIS — R053 Chronic cough: Secondary | ICD-10-CM

## 2018-02-28 LAB — NITRIC OXIDE: NITRIC OXIDE: 39

## 2018-02-28 MED ORDER — MOMETASONE FUROATE 220 MCG/INH IN AEPB: 2.0000 | INHALATION_SPRAY | Freq: Every day | RESPIRATORY_TRACT | 12 refills | Status: DC

## 2018-02-28 MED ORDER — PREDNISONE 10 MG PO TABS: ORAL_TABLET | ORAL | 0 refills | Status: DC

## 2018-02-28 NOTE — Patient Instructions (Addendum)
ICD-10-CM   1. Chronic cough R05   2. 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

## 2018-02-28 NOTE — Progress Notes (Signed)
Subjective:     Patient ID: Isaac Weber, male   DOB: February 14, 1946, 72 y.o.   MRN: [id number]  HPI  PCP Leighton Ruff, MD  HPI    IOV  07/08/2017  Chief Complaint  Patient presents with  . Advice Only    Referred by Dr. Drema Dallas for cough x58months.  Pt states that his cough is  a dry cough. Pt was put on nexium x2 weeks for acid reflux which he states has loosened the mucus some.      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  He tells me that mid September 2018 he went to the beach.  Upon return from the beach his granddaughter was sick with a cough and subsequently on April 16, 2017 he abruptly developed a cough.  Since then the cough has persisted.  The only thing that has improved and his cough is that he is no longer having significant night cough although he still does have some  amount of night cough.  This night cough resolved after opioid cough syrup.  Cough is made worse by talking, laughing and also randomly.  It is associated with clearing of the throat.  He feels a tightness in the upper chest.  There is no associated wheezing but there is still some residual nocturnal symptoms.  There is no associated acid reflux.  He has been on Nexium for a week or 2 without much relief.  He has not tried anything for nasal but he denies any nasal discharge.  He is not on any ACE inhibitors.  He does not have a previous history of asthma but his exam nitric oxide today is elevated borderline at 44 ppb.  He does clear the throat.  The cough is annoying.  He tells me that he did have a chest x-ray with his primary care physician that was clear.  The history is obtained from him, talking to his wife and review of the primary care physician referral notes.  Lab review she had a hemoglobin 11.6 g% in February 2016 and a eosinophils f 300 cells.  He is a former smoker  He does not have any shortness of breath.  He walks over a mile.   Sometimes he will cough and a pro-air can help him.  He does find the [employer] helps him.  At this point in time he prefers for conservative simplistic line of treatment   OV 08/18/2017  Chief Complaint  Patient presents with  . Follow-up    Pt states he has been doing good since last visit. Cough is better.   Follow-up chronic cough  After last visit he decided to take Asmanex.  He also followed a diet and took Prilosec.  With this the cough is more than 60% better as documented in the RSI cough score.  He does not want to take any more new medications.  He feels prednisone burst helped him a lot in the initial few days.  He is wondering which of these measures have helped him the most.  There are no new issues.  He wants to liberalize his diet which he says is actually helping him with the sugars and with the cough but he would like to be less disciplined about food.  He is willing to continue with Asmanex   feno 43 ppb  OV 02/28/2018  Chief Complaint  Patient presents with  . Follow-up    Pt states things  had been doing good for him and did go off of the inhaler but stated after he had been off of the inhaler for about a week, the cough came back in the mornings and now he states he is coughing all the time. He states he is still not taking the medication and he states he is still coughing.    Follow-up chronic cough  He is here with his wife. He tells me that approximately one month ago because he was feeling well without any cough he stopped his Asmanex and then 2 weeks ago his cough returned. RSI cough score is 17. When he lies downhe has a cough. But he does not wake up in the middle of the night because of cough. There is no associated wheezing or shortness of breath or chest tightness or any change in his health status. Social history: His wife is new diagnosis of liver cirrhosis. She is going MRI today. Exam nitric oxide is in the indeterminate range today for him. Review of the  chart indicates last chest x-ray was in 2010.   Dr Lorenza Cambridge Reflux Symptom Index (> 13-15 suggestive of LPR cough) 0 -> 5  =  none ->severe problem.td 07/08/2017  08/18/2017  02/28/2018   Hoarseness of problem with voice 1 2 2   Clearing  Of Throat 3 2 2   Excess throat mucus or feeling of post nasal drip 1 0 3  Difficulty swallowing food, liquid or tablets 0 0 0  Cough after eating or lying down 4 1 5   Breathing difficulties or choking episodes 0 0 0  Troublesome or annoying cough 5 2 5   Sensation of something sticking in throat or lump in throat 0 0 0  Heartburn, chest pain, indigestion, or stomach acid coming up 0 0 0  TOTAL 14 7 17     Results for ZAVEON, GILLEN (MRN [id number]) as of 02/28/2018 09:15  Ref. Range 09/16/2014 13:54  Eosinophils Absolute Latest Ref Range: 0.0 - 0.5 10e3/uL 0.3        has a past medical history of BPH (benign prostatic hyperplasia), Diabetes mellitus without complication ([place]), and Hypertension.   reports that he quit smoking about 46 years ago. His smoking use included cigarettes. He has a 42.00 pack-year smoking history. He has never used smokeless tobacco.  Past Surgical History:  Procedure Laterality Date  . BUNIONECTOMY    . KNEE ARTHROSCOPY      Allergies  Allergen Reactions  . Ciprofloxacin   . Morphine And Related     Immunization History  Administered Date(s) Administered  . Influenza, High Dose Seasonal PF 07/15/2017  . Zoster Recombinat (Shingrix) 08/17/2017    Family History  Family history unknown: Yes     Current Outpatient Medications:  .  amLODipine (NORVASC) 5 MG tablet, Take 5 mg by mouth daily., Disp: , Rfl:  .  Calcium Carb-Cholecalciferol (CALCIUM + D3 PO), Take by mouth., Disp: , Rfl:  .  Glucosamine-Chondroitin (GLUCOSAMINE CHONDR COMPLEX PO), Take by mouth 2 (two) times daily. , Disp: , Rfl:  .  ibuprofen (ADVIL,MOTRIN) 200 MG tablet, Take 200 mg by mouth every 6 (six) hours as needed., Disp: , Rfl:  .   losartan-hydrochlorothiazide (HYZAAR) 50-12.5 MG per tablet, Take 1 tablet by mouth daily., Disp: , Rfl:  .  metFORMIN (GLUCOPHAGE) 500 MG tablet, Take by mouth 2 (two) times daily with a meal., Disp: , Rfl:  .  rosuvastatin (CRESTOR) 5 MG tablet, , Disp: , Rfl:  .  mometasone (  ASMANEX 60 METERED DOSES) 220 MCG/INH inhaler, Inhale 2 puffs into the lungs daily. (Patient not taking: Reported on 02/28/2018), Disp: 1 Inhaler, Rfl: 12 .  PROAIR HFA 108 (90 Base) MCG/ACT inhaler, INL 2 PFS PO INTO THE LUNGS BID PRN, Disp: , Rfl: 0     Review of Systems     Objective:   Physical Exam  Constitutional: He is oriented to person, place, and time. He appears well-developed and well-nourished. No distress.  HENT:  Head: Normocephalic and atraumatic.  Right Ear: External ear normal.  Left Ear: External ear normal.  Mouth/Throat: Oropharynx is clear and moist. No oropharyngeal exudate.  Eyes: Pupils are equal, round, and reactive to light. Conjunctivae and EOM are normal. Right eye exhibits no discharge. Left eye exhibits no discharge. No scleral icterus.  Neck: Normal range of motion. Neck supple. No JVD present. No tracheal deviation present. No thyromegaly present.  Cardiovascular: Normal rate, regular rhythm and intact distal pulses. Exam reveals no gallop and no friction rub.  No murmur heard. Pulmonary/Chest: Effort normal and breath sounds normal. No respiratory distress. He has no wheezes. He has no rales. He exhibits no tenderness.  Abdominal: Soft. Bowel sounds are normal. He exhibits no distension and no mass. There is no tenderness. There is no rebound and no guarding.  Musculoskeletal: Normal range of motion. He exhibits no edema or tenderness.  Lymphadenopathy:    He has no cervical adenopathy.  Neurological: He is alert and oriented to person, place, and time. He has normal reflexes. No cranial nerve deficit. Coordination normal.  Skin: Skin is warm and dry. No rash noted. He is not  diaphoretic. No erythema. No pallor.  Psychiatric: He has a normal mood and affect. His behavior is normal. Judgment and thought content normal.  Nursing note and vitals reviewed.  Vitals:   02/28/18 0910  BP: 122/70  Pulse: 90  SpO2: 98%    Estimated body mass index is 25.58 kg/m as calculated from the following:   Height as of 08/18/17: 5\' 9"  (1.753 m).   Weight as of 08/18/17: 173 lb 3.2 oz (78.6 kg).     Assessment:       ICD-10-CM   1. Chronic cough R05   2. 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 Brand Males, M.D., [facility].C.P Pulmonary and Critical Care Medicine Staff Physician, [place] Director - Interstitial Lung Disease  Program  [facility] [place], [place], [address]  Pager: [phone], If no answer or between  15:00h - 7:00h: call [phone] Telephone: [phone]

## 2018-03-02 DIAGNOSIS — M9903 Segmental and somatic dysfunction of lumbar region: Secondary | ICD-10-CM | POA: Diagnosis not present

## 2018-03-02 DIAGNOSIS — M9905 Segmental and somatic dysfunction of pelvic region: Secondary | ICD-10-CM | POA: Diagnosis not present

## 2018-03-02 DIAGNOSIS — M5432 Sciatica, left side: Secondary | ICD-10-CM | POA: Diagnosis not present

## 2018-03-02 DIAGNOSIS — M5136 Other intervertebral disc degeneration, lumbar region: Secondary | ICD-10-CM | POA: Diagnosis not present

## 2018-03-06 DIAGNOSIS — M9905 Segmental and somatic dysfunction of pelvic region: Secondary | ICD-10-CM | POA: Diagnosis not present

## 2018-03-06 DIAGNOSIS — M9903 Segmental and somatic dysfunction of lumbar region: Secondary | ICD-10-CM | POA: Diagnosis not present

## 2018-03-06 DIAGNOSIS — M5136 Other intervertebral disc degeneration, lumbar region: Secondary | ICD-10-CM | POA: Diagnosis not present

## 2018-03-06 DIAGNOSIS — M5432 Sciatica, left side: Secondary | ICD-10-CM | POA: Diagnosis not present

## 2018-03-08 DIAGNOSIS — M5432 Sciatica, left side: Secondary | ICD-10-CM | POA: Diagnosis not present

## 2018-03-08 DIAGNOSIS — M9905 Segmental and somatic dysfunction of pelvic region: Secondary | ICD-10-CM | POA: Diagnosis not present

## 2018-03-08 DIAGNOSIS — M5136 Other intervertebral disc degeneration, lumbar region: Secondary | ICD-10-CM | POA: Diagnosis not present

## 2018-03-08 DIAGNOSIS — M9903 Segmental and somatic dysfunction of lumbar region: Secondary | ICD-10-CM | POA: Diagnosis not present

## 2018-03-09 ENCOUNTER — Emergency Department (HOSPITAL_COMMUNITY): Payer: PPO

## 2018-03-09 ENCOUNTER — Encounter (HOSPITAL_COMMUNITY): Payer: Self-pay | Admitting: Emergency Medicine

## 2018-03-09 ENCOUNTER — Other Ambulatory Visit: Payer: Self-pay

## 2018-03-09 ENCOUNTER — Emergency Department (HOSPITAL_COMMUNITY)
Admission: EM | Admit: 2018-03-09 | Discharge: 2018-03-10 | Disposition: A | Discharge status: Home / Self Care | Payer: PPO | Attending: Emergency Medicine | Admitting: Emergency Medicine

## 2018-03-09 DIAGNOSIS — Z7984 Long term (current) use of oral hypoglycemic drugs: Secondary | ICD-10-CM | POA: Diagnosis not present

## 2018-03-09 DIAGNOSIS — Z79899 Other long term (current) drug therapy: Secondary | ICD-10-CM | POA: Insufficient documentation

## 2018-03-09 DIAGNOSIS — H81399 Other peripheral vertigo, unspecified ear: Secondary | ICD-10-CM | POA: Diagnosis not present

## 2018-03-09 DIAGNOSIS — I10 Essential (primary) hypertension: Secondary | ICD-10-CM | POA: Insufficient documentation

## 2018-03-09 DIAGNOSIS — Z87891 Personal history of nicotine dependence: Secondary | ICD-10-CM | POA: Insufficient documentation

## 2018-03-09 DIAGNOSIS — E119 Type 2 diabetes mellitus without complications: Secondary | ICD-10-CM | POA: Insufficient documentation

## 2018-03-09 DIAGNOSIS — R42 Dizziness and giddiness: Secondary | ICD-10-CM | POA: Diagnosis not present

## 2018-03-09 DIAGNOSIS — R270 Ataxia, unspecified: Secondary | ICD-10-CM | POA: Diagnosis not present

## 2018-03-09 LAB — APTT: APTT: 32 s (ref 24–36)

## 2018-03-09 LAB — COMPREHENSIVE METABOLIC PANEL
ALT: 24 U/L (ref 0–44)
AST: 19 U/L (ref 15–41)
Albumin: 3.9 g/dL (ref 3.5–5.0)
Alkaline Phosphatase: 86 U/L (ref 38–126)
Anion gap: 7 (ref 5–15)
BUN: 16 mg/dL (ref 8–23)
CHLORIDE: 101 mmol/L (ref 98–111)
CO2: 28 mmol/L (ref 22–32)
CREATININE: 0.77 mg/dL (ref 0.61–1.24)
Calcium: 9.5 mg/dL (ref 8.9–10.3)
GFR calc non Af Amer: >60 mL/min (ref >60)
Glucose, Bld: 188 mg/dL — ABNORMAL HIGH (ref 70–99)
POTASSIUM: 4 mmol/L (ref 3.5–5.1)
SODIUM: 136 mmol/L (ref 135–145)
Total Bilirubin: 0.7 mg/dL (ref 0.3–1.2)
Total Protein: 6.7 g/dL (ref 6.5–8.1)

## 2018-03-09 LAB — CBC
HCT: 42.2 % (ref 39.0–52.0)
HEMOGLOBIN: 14.7 g/dL (ref 13.0–17.0)
MCH: 30.9 pg (ref 26.0–34.0)
MCHC: 34.8 g/dL (ref 30.0–36.0)
MCV: 88.8 fL (ref 78.0–100.0)
Platelets: 202 10*3/uL (ref 150–400)
RBC: 4.75 MIL/uL (ref 4.22–5.81)
RDW: 12.9 % (ref 11.5–15.5)
WBC: 8.6 10*3/uL (ref 4.0–10.5)

## 2018-03-09 LAB — I-STAT CHEM 8, ED
BUN: 18 mg/dL (ref 8–23)
CHLORIDE: 98 mmol/L (ref 98–111)
Calcium, Ion: 1.27 mmol/L (ref 1.15–1.40)
Creatinine, Ser: 0.7 mg/dL (ref 0.61–1.24)
GLUCOSE: 188 mg/dL — AB (ref 70–99)
HEMATOCRIT: 41 % (ref 39.0–52.0)
Hemoglobin: 13.9 g/dL (ref 13.0–17.0)
POTASSIUM: 4 mmol/L (ref 3.5–5.1)
Sodium: 138 mmol/L (ref 135–145)
TCO2: 27 mmol/L (ref 22–32)

## 2018-03-09 LAB — DIFFERENTIAL
ABS IMMATURE GRANULOCYTES: 0.1 10*3/uL (ref 0.0–0.1)
BASOS ABS: 0.1 10*3/uL (ref 0.0–0.1)
BASOS PCT: 1 %
EOS ABS: 0.4 10*3/uL (ref 0.0–0.7)
Eosinophils Relative: 5 %
Immature Granulocytes: 1 %
Lymphocytes Relative: 21 %
Lymphs Abs: 1.8 10*3/uL (ref 0.7–4.0)
Monocytes Absolute: 0.9 10*3/uL (ref 0.1–1.0)
Monocytes Relative: 10 %
NEUTROS PCT: 62 %
Neutro Abs: 5.4 10*3/uL (ref 1.7–7.7)

## 2018-03-09 LAB — I-STAT TROPONIN, ED: Troponin i, poc: 0 ng/mL (ref 0.00–0.08)

## 2018-03-09 LAB — PROTIME-INR
INR: 0.99
Prothrombin Time: 13 seconds (ref 11.4–15.2)

## 2018-03-09 NOTE — ED Triage Notes (Signed)
Pt sent by PCP, developed dizziness this am, worse with positional changes. Denies chest pain/shortness of breath or headaches. A&O x 4, no neuro deficits noted.

## 2018-03-09 NOTE — ED Provider Notes (Signed)
Patient placed in Quick Look pathway, seen and evaluated   Chief Complaint: dizzy  HPI:   URBANO MILHOUSE is a 72 y.o. male Pt sent by PCP, developed dizziness this am, worse with positional changes. Denies chest pain/shortness of breath or headaches. A&O x 4, no neuro deficits noted. Patient reports that his doctor sent him to the ED for a CT of his head.   ROS: Neuro: dizziness  Physical Exam:  BP 139/86 (BP Location: Right Arm)   Pulse 88   Temp 97.9 F (36.6 C) (Oral)   Resp 18   SpO2 97%    Gen: No distress  Neuro: Awake and Alert  Skin: Warm and dry   Initiation of care has begun. The patient has been counseled on the process, plan, and necessity for staying for the completion/evaluation, and the remainder of the medical screening examination    Ashley Murrain, NP 03/09/18 1839    Lacretia Leigh, MD 03/09/18 2232

## 2018-03-10 ENCOUNTER — Emergency Department (HOSPITAL_COMMUNITY): Payer: PPO

## 2018-03-10 DIAGNOSIS — R42 Dizziness and giddiness: Secondary | ICD-10-CM | POA: Diagnosis not present

## 2018-03-10 DIAGNOSIS — M5432 Sciatica, left side: Secondary | ICD-10-CM | POA: Diagnosis not present

## 2018-03-10 DIAGNOSIS — M5136 Other intervertebral disc degeneration, lumbar region: Secondary | ICD-10-CM | POA: Diagnosis not present

## 2018-03-10 DIAGNOSIS — M9903 Segmental and somatic dysfunction of lumbar region: Secondary | ICD-10-CM | POA: Diagnosis not present

## 2018-03-10 DIAGNOSIS — M9905 Segmental and somatic dysfunction of pelvic region: Secondary | ICD-10-CM | POA: Diagnosis not present

## 2018-03-10 MED ORDER — MECLIZINE HCL 25 MG PO TABS: 25.0000 mg | ORAL_TABLET | Freq: Three times a day (TID) | ORAL | 0 refills | Status: DC | PRN

## 2018-03-10 MED ORDER — PROCHLORPERAZINE EDISYLATE 10 MG/2ML IJ SOLN
5.0000 mg | Freq: Once | INTRAMUSCULAR | Status: AC
  Administered 2018-03-10: 5 mg via INTRAVENOUS
  Filled 2018-03-10: qty 2

## 2018-03-10 MED ORDER — SODIUM CHLORIDE 0.9 % IV BOLUS
1000.0000 mL | Freq: Once | INTRAVENOUS | Status: AC
  Administered 2018-03-10: 1000 mL via INTRAVENOUS

## 2018-03-10 MED ORDER — DIPHENHYDRAMINE HCL 50 MG/ML IJ SOLN
12.5000 mg | Freq: Once | INTRAMUSCULAR | Status: AC
  Administered 2018-03-10: 12.5 mg via INTRAVENOUS
  Filled 2018-03-10: qty 1

## 2018-03-10 NOTE — Discharge Instructions (Signed)
Follow up with your PCP, return for worsening dizziness.

## 2018-03-10 NOTE — ED Provider Notes (Signed)
[facility] Provider Note   CSN: [id number] Arrival date & time: 03/09/18  1801     History   Chief Complaint Chief Complaint  Patient presents with  . Dizziness    HPI Isaac Weber is a 72 y.o. male.  72 yo M with a chief complaint of dizziness.  The patient feels that the room is spinning around him.  This started this morning.  He thinks it got somewhat better.  He was seen by his PCP and they felt he needed to come here for evaluation as he was having trouble walking and had some difficulty on his neuro exam.  He has had some nausea but denies vomiting.  Worse usually when he stands and tries to walk or turns his head.  Denies tinnitus.  Denies head injury.  Denies chest pain or shortness of breath.  Denies neck pain or abdominal pain or vomiting.  Denies decreased oral intake.  The history is provided by the patient.  Illness  This is a new problem. The current episode started 6 to 12 hours ago. The problem occurs constantly. The problem has been gradually improving. Pertinent negatives include no chest pain, no abdominal pain, no headaches and no shortness of breath. Nothing aggravates the symptoms. Nothing relieves the symptoms. He has tried nothing for the symptoms. The treatment provided no relief.    Past Medical History:  Diagnosis Date  . BPH (benign prostatic hyperplasia)   . Diabetes mellitus without complication ([place])   . Hypertension     Patient Active Problem List   Diagnosis Date Noted  . Deficiency anemia 09/16/2014  . Hypertension     Past Surgical History:  Procedure Laterality Date  . BUNIONECTOMY    . KNEE ARTHROSCOPY          Home Medications    Prior to Admission medications   Medication Sig Start Date End Date Taking? Authorizing Provider  amLODipine (NORVASC) 5 MG tablet Take 5 mg by mouth daily.   Yes [provider]  Calcium Carb-Cholecalciferol (CALCIUM + D3 PO) Take by mouth.   Yes  [provider]  calcium carbonate (OSCAL) 1500 (600 Ca) MG TABS tablet Take 600 mg of elemental calcium by mouth daily.   Yes [provider]  ferrous sulfate 325 (65 FE) MG tablet Take 325 mg by mouth daily with breakfast.   Yes [provider]  Glucosamine-Chondroitin (GLUCOSAMINE CHONDR COMPLEX PO) Take by mouth 2 (two) times daily.    Yes [provider]  ibuprofen (ADVIL,MOTRIN) 200 MG tablet Take 200-800 mg by mouth every 6 (six) hours as needed for fever, headache, mild pain, moderate pain or cramping.    Yes [provider]  loratadine (CLARITIN) 10 MG tablet Take 10 mg by mouth daily.   Yes [provider]  losartan-hydrochlorothiazide (HYZAAR) 50-12.5 MG per tablet Take 1 tablet by mouth daily.   Yes [provider]  metFORMIN (GLUMETZA) 500 MG (MOD) 24 hr tablet Take 500 mg by mouth daily with breakfast.   Yes [provider]  mometasone (ASMANEX 60 METERED DOSES) 220 MCG/INH inhaler Inhale 2 puffs into the lungs daily. 02/28/18  Yes Brand Males, MD  Multiple Vitamin (MULTIVITAMIN WITH MINERALS) TABS tablet Take 1 tablet by mouth daily.   Yes [provider]  PROAIR HFA 108 (90 Base) MCG/ACT inhaler INL 2 PFS PO INTO THE LUNGS BID PRN 05/19/17  Yes [provider]  terbinafine (LAMISIL) 250 MG tablet Take  250 mg by mouth at bedtime. 03/06/18  Yes [provider]  meclizine (ANTIVERT) 25 MG tablet Take 1 tablet (25 mg total) by mouth 3 (three) times daily as needed for dizziness. 03/10/18   Deno Etienne, DO  predniSONE (DELTASONE) 10 MG tablet Take 40x2days,20x2days,10x2days,5x2daysthen stop Patient not taking: Reported on 03/09/2018 02/28/18   Brand Males, MD  rosuvastatin (CRESTOR) 5 MG tablet Take 5 mg by mouth daily.  06/17/17   [provider]    Family History Family History  Family history unknown: Yes    Social History Social History   Tobacco Use  . Smoking  status: Former Smoker    Packs/day: 3.00    Years: 14.00    Pack years: 42.00    Types: Cigarettes    Last attempt to quit: 09/17/1971    Years since quitting: 46.5  . Smokeless tobacco: Never Used  Substance Use Topics  . Alcohol use: No  . Drug use: No     Allergies   Ciprofloxacin and Morphine and related   Review of Systems Review of Systems  Constitutional: Negative for chills and fever.  HENT: Negative for congestion and facial swelling.   Eyes: Negative for discharge and visual disturbance.  Respiratory: Negative for shortness of breath.   Cardiovascular: Negative for chest pain and palpitations.  Gastrointestinal: Negative for abdominal pain, diarrhea and vomiting.  Musculoskeletal: Negative for arthralgias and myalgias.  Skin: Negative for color change and rash.  Neurological: Positive for dizziness. Negative for tremors, syncope and headaches.  Psychiatric/Behavioral: Negative for confusion and dysphoric mood.     Physical Exam Updated Vital Signs BP 129/71 (BP Location: Right Arm)   Pulse 71   Temp 98.4 F (36.9 C) (Oral)   Resp 18   SpO2 99%   Physical Exam  Constitutional: He is oriented to person, place, and time. He appears well-developed and well-nourished.  HENT:  Head: Normocephalic and atraumatic.  Eyes: Pupils are equal, round, and reactive to light. EOM are normal.  Neck: Normal range of motion. Neck supple. No JVD present.  Cardiovascular: Normal rate and regular rhythm. Exam reveals no gallop and no friction rub.  No murmur heard. Pulmonary/Chest: No respiratory distress. He has no wheezes.  Abdominal: He exhibits no distension. There is no rebound and no guarding.  Musculoskeletal: Normal range of motion.  Neurological: He is alert and oriented to person, place, and time. He has normal strength. No sensory deficit. Gait abnormal. Coordination normal. GCS eye subscore is 4. GCS verbal subscore is 5. GCS motor subscore is 6.  Patient is able  to walk, he is a bit wobbly.  He has bilateral fast going nystagmus.  Asymmetric palate elevation.   Skin: No rash noted. No pallor.  Psychiatric: He has a normal mood and affect. His behavior is normal.  Nursing note and vitals reviewed.    ED Treatments / Results  Labs (all labs ordered are listed, but only abnormal results are displayed) Labs Reviewed  COMPREHENSIVE METABOLIC PANEL - Abnormal; Notable for the following components:      Result Value   Glucose, Bld 188 (*)    All other components within normal limits  I-STAT CHEM 8, ED - Abnormal; Notable for the following components:   Glucose, Bld 188 (*)    All other components within normal limits  PROTIME-INR  APTT  CBC  DIFFERENTIAL  I-STAT TROPONIN, ED  CBG MONITORING, ED    EKG None  Radiology Ct Head Wo Contrast  Result Date:  03/09/2018 CLINICAL DATA:  Dizziness for several hours EXAM: CT HEAD WITHOUT CONTRAST TECHNIQUE: Contiguous axial images were obtained from the base of the skull through the vertex without intravenous contrast. COMPARISON:  None. FINDINGS: Brain: No evidence of acute infarction, hemorrhage, hydrocephalus, extra-axial collection or mass lesion/mass effect. Vascular: No hyperdense vessel or unexpected calcification. Skull: Normal. Negative for fracture or focal lesion. Sinuses/Orbits: No acute finding. Other: None. IMPRESSION: Normal head CT Electronically Signed   By: Inez Catalina M.D.   On: 03/09/2018 20:45   Mr Brain Wo Contrast  Result Date: 03/10/2018 CLINICAL DATA:  Initial evaluation for persistent dizziness, vertigo. EXAM: MRI HEAD WITHOUT CONTRAST TECHNIQUE: Multiplanar, multiecho pulse sequences of the brain and surrounding structures were obtained without intravenous contrast. COMPARISON:  Prior CT from 03/09/2018. FINDINGS: Brain: Generalized age appropriate cerebral atrophy. Mild chronic small vessel ischemic changes present within the periventricular and deep white matter both cerebral  hemispheres. No abnormal foci of restricted diffusion to suggest acute or subacute ischemia. Gray-white matter differentiation maintained. No encephalomalacia to suggest chronic cortical infarction. No foci of susceptibility artifact to suggest acute or chronic intracranial hemorrhage. No mass lesion, midline shift or mass effect. No hydrocephalus. No extra-axial fluid collection. Pituitary gland normal. Vascular: Major intracranial vascular flow voids are well maintained. Skull and upper cervical spine: Craniocervical junction normal. Bone marrow signal intensity within normal limits. No scalp soft tissue abnormality. Sinuses/Orbits: Globes and orbital soft tissues within normal limits. Paranasal sinuses are clear. No mastoid effusion. Inner ear structures normal. Other: None. IMPRESSION: 1. No acute intracranial abnormality. 2. Mild chronic microvascular ischemic disease for age. Electronically Signed   By: Jeannine Boga M.D.   On: 03/10/2018 02:41    Procedures Procedures (including critical care time)  Medications Ordered in ED Medications  prochlorperazine (COMPAZINE) injection 5 mg (5 mg Intravenous Given 03/10/18 0117)  diphenhydrAMINE (BENADRYL) injection 12.5 mg (12.5 mg Intravenous Given 03/10/18 0118)  sodium chloride 0.9 % bolus 1,000 mL (0 mLs Intravenous Stopped 03/10/18 0217)     Initial Impression / Assessment and Plan / ED Course  I have reviewed the triage vital signs and the nursing notes.  Pertinent labs & imaging results that were available during my care of the patient were reviewed by me and considered in my medical decision making (see chart for details).     72 yo M with a chief complaint of dizziness.  Sounds more like vertigo based on his history.  He however does have bilateral fast going nystagmus and a symmetric palate elevation on my exam.  Will obtain an MRI.  MR negative. Patient ambulating.  D/c home.    6:58 AM:  I have discussed the  diagnosis/risks/treatment options with the patient and believe the pt to be eligible for discharge home to follow-up with PCP. We also discussed returning to the ED immediately if new or worsening sx occur. We discussed the sx which are most concerning (e.g., sudden worsening pain, fever, inability to tolerate by mouth) that necessitate immediate return. Medications administered to the patient during their visit and any new prescriptions provided to the patient are listed below.  Medications given during this visit Medications  prochlorperazine (COMPAZINE) injection 5 mg (5 mg Intravenous Given 03/10/18 0117)  diphenhydrAMINE (BENADRYL) injection 12.5 mg (12.5 mg Intravenous Given 03/10/18 0118)  sodium chloride 0.9 % bolus 1,000 mL (0 mLs Intravenous Stopped 03/10/18 0217)      The patient appears reasonably screen and/or stabilized for discharge and I doubt any other medical  condition or other [facility] requiring further screening, evaluation, or treatment in the ED at this time prior to discharge.    Final Clinical Impressions(s) / ED Diagnoses   Final diagnoses:  Peripheral vertigo, unspecified laterality    ED Discharge Orders         Ordered    meclizine (ANTIVERT) 25 MG tablet  3 times daily PRN     03/10/18 [place], [place], DO 03/10/18 [phone]

## 2018-03-20 NOTE — Telephone Encounter (Signed)
MR please advise. Thanks! 

## 2018-03-22 NOTE — Telephone Encounter (Signed)
There is already another message on this matter. It was routed to MR again this morning. Will close this duplicate message.

## 2018-03-22 NOTE — Telephone Encounter (Signed)
Mr Isaac Weber it can be done either /or and is a dosing issue. BEcause your cough had returned I opted for 2 puff twice daily with ultimte plan to reduce dose. IF 2 puff once daily is working well for you , you can stick with it

## 2018-03-24 ENCOUNTER — Telehealth: Payer: Self-pay | Admitting: Internal Medicine

## 2018-03-24 NOTE — Telephone Encounter (Signed)
Called and spoke with [facility] in [place], pharmacy.  The Patient can not refill his Asmanex inhaler until 03/26/18, per insurance.  Original prescription sent in 02/28/18 for 1 inhaler and 12 refills.  Left message for Patient to call back reguarding his Asmanex.

## 2018-03-28 NOTE — Telephone Encounter (Signed)
Attempted to call pt. I did not receive an answer. I have left a message for pt to return our call.  

## 2018-03-29 NOTE — Telephone Encounter (Signed)
Attempted to contact pt. Call went straight to voicemail. I have left a message for the pt to return our call. 

## 2018-03-29 NOTE — Telephone Encounter (Signed)
Spoke with patient. He stated that he was able to get his refill yesterday. Nothing further needed at time of call.

## 2018-03-29 NOTE — Telephone Encounter (Signed)
Patient returning call - He can be reached at [phone]

## 2018-05-31 ENCOUNTER — Encounter: Payer: Self-pay | Admitting: Internal Medicine

## 2018-05-31 ENCOUNTER — Ambulatory Visit: Payer: PPO | Admitting: Internal Medicine

## 2018-05-31 VITALS — BP 130/80 | HR 79 | Ht 69.0 in | Wt 170.0 lb

## 2018-05-31 DIAGNOSIS — J45991 Cough variant asthma: Secondary | ICD-10-CM

## 2018-05-31 MED ORDER — MOMETASONE FUROATE 220 MCG/INH IN AEPB: 2.0000 | INHALATION_SPRAY | Freq: Every day | RESPIRATORY_TRACT | 12 refills | Status: DC

## 2018-05-31 NOTE — Progress Notes (Signed)
PCP Leighton Ruff, MD  HPI    IOV  07/08/2017  Chief Complaint  Patient presents with  . Advice Only    Referred by Dr. Drema Dallas for cough x66months.  Pt states that his cough is  a dry cough. Pt was put on nexium x2 weeks for acid reflux which he states has loosened the mucus some.      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  He tells me that mid September 2018 he went to the beach.  Upon return from the beach his granddaughter was sick with a cough and subsequently on April 16, 2017 he abruptly developed a cough.  Since then the cough has persisted.  The only thing that has improved and his cough is that he is no longer having significant night cough although he still does have some  amount of night cough.  This night cough resolved after opioid cough syrup.  Cough is made worse by talking, laughing and also randomly.  It is associated with clearing of the throat.  He feels a tightness in the upper chest.  There is no associated wheezing but there is still some residual nocturnal symptoms.  There is no associated acid reflux.  He has been on Nexium for a week or 2 without much relief.  He has not tried anything for nasal but he denies any nasal discharge.  He is not on any ACE inhibitors.  He does not have a previous history of asthma but his exam nitric oxide today is elevated borderline at 44 ppb.  He does clear the throat.  The cough is annoying.  He tells me that he did have a chest x-ray with his primary care physician that was clear.  The history is obtained from him, talking to his wife and review of the primary care physician referral notes.  Lab review she had a hemoglobin 11.6 g% in February 2016 and a eosinophils f 300 cells.  He is a former smoker  He does not have any shortness of breath.  He walks over a mile.  Sometimes he will cough and a pro-air can help him.  He does find the [employer] helps him.  At this point  in time he prefers for conservative simplistic line of treatment   OV 08/18/2017  Chief Complaint  Patient presents with  . Follow-up    Pt states he has been doing good since last visit. Cough is better.   Follow-up chronic cough  After last visit he decided to take Asmanex.  He also followed a diet and took Prilosec.  With this the cough is more than 60% better as documented in the RSI cough score.  He does not want to take any more new medications.  He feels prednisone burst helped him a lot in the initial few days.  He is wondering which of these measures have helped him the most.  There are no new issues.  He wants to liberalize his diet which he says is actually helping him with the sugars and with the cough but he would like to be less disciplined about food.  He is willing to continue with Asmanex   feno 43 ppb  OV 02/28/2018  Chief Complaint  Patient presents with  . Follow-up    Pt states things had been doing good for him and did go off of the inhaler but stated after he had been off of  the inhaler for about a week, the cough came back in the mornings and now he states he is coughing all the time. He states he is still not taking the medication and he states he is still coughing.    Follow-up chronic cough  He is here with his wife. He tells me that approximately one month ago because he was feeling well without any cough he stopped his Asmanex and then 2 weeks ago his cough returned. RSI cough score is 17. When he lies downhe has a cough. But he does not wake up in the middle of the night because of cough. There is no associated wheezing or shortness of breath or chest tightness or any change in his health status. Social history: His wife is new diagnosis of liver cirrhosis. She is going MRI today. Exam nitric oxide is in the indeterminate range today for him. Review of the chart indicates last chest x-ray was in 2010.    OV 05/31/2018  Subjective:  Patient ID: RISHIKESH KHACHATRYAN, male , DOB: 1946/01/05 , age 61 y.o. , MRN: [id number] , ADDRESS: [address] [place] [facility] [place] [address]   05/31/2018 -   Chief Complaint  Patient presents with  . Follow-up    Doing well at this time.     HPI CLERANCE UMLAND 72 y.o. -presents for follow-up of cough variant asthma.  Last visit we put him back on Asmanex after his cough deteriorated.  This was in August 2019.  With this his cough is significantly improved RSI cough score is 5.  He realizes the value of taking Asmanex.  He takes his Asmanex 1 puff 2 times daily.  The only interim issues that March 09, 2018 he ended up in the ER lab review shows normal.  It was for dizziness.  Socially his wife Fraser Din has had liver cyst surgery at [facility] in October 2019 and is doing well.  He is up-to-date with his flu shot but our internal immunization record shows that he has not had pneumonia vaccine.      Dr Lorenza [place] Reflux Symptom Index (> 13-15 suggestive of LPR cough) Results for QUINTERIUS, GAIDA (MRN [id number]) as of 05/31/2018 09:46  Ref. Range 03/09/2018 18:36 03/09/2018 18:45  Eosinophils Absolute Latest Ref Range: 0.0 - 0.7 K/uL 0.4    0 -> 5  =  none ->severe problem.td 07/08/2017  08/18/2017  02/28/2018  05/31/2018 asmanex 1 bid  Hoarseness of problem with voice 1 2 2 3   Clearing  Of Throat 3 2 2  0  Excess throat mucus or feeling of post nasal drip 1 0 3 0  Difficulty swallowing food, liquid or tablets 0 0 0 0  Cough after eating or lying down 4 1 5 1   Breathing difficulties or choking episodes 0 0 0 0  Troublesome or annoying cough 5 2 5 1   Sensation of something sticking in throat or lump in throat 0 0 0 0  Heartburn, chest pain, indigestion, or stomach acid coming up 0 0 0 0  TOTAL 14 7 17 5       ROS - per HPI     has a past medical history of BPH (benign prostatic hyperplasia), Diabetes mellitus without complication ([place]), and Hypertension.   reports that he quit smoking about 46 years ago. His smoking use  included cigarettes. He has a 42.00 pack-year smoking history. He has never used smokeless tobacco.  Past Surgical History:  Procedure Laterality Date  . BUNIONECTOMY    .  KNEE ARTHROSCOPY      Allergies  Allergen Reactions  . Ciprofloxacin Diarrhea and Nausea And Vomiting  . Morphine And Related Diarrhea and Nausea And Vomiting    Immunization History  Administered Date(s) Administered  . Influenza, High Dose Seasonal PF 07/15/2017, 04/12/2018  . Zoster Recombinat (Shingrix) 08/17/2017    Family History  Family history unknown: Yes     Current Outpatient Medications:  .  amLODipine (NORVASC) 5 MG tablet, Take 5 mg by mouth daily., Disp: , Rfl:  .  Calcium Carb-Cholecalciferol (CALCIUM + D3 PO), Take by mouth., Disp: , Rfl:  .  calcium carbonate (OSCAL) 1500 (600 Ca) MG TABS tablet, Take 600 mg of elemental calcium by mouth daily., Disp: , Rfl:  .  ferrous sulfate 325 (65 FE) MG tablet, Take 325 mg by mouth daily with breakfast., Disp: , Rfl:  .  Glucosamine-Chondroitin (GLUCOSAMINE CHONDR COMPLEX PO), Take by mouth 2 (two) times daily. , Disp: , Rfl:  .  ibuprofen (ADVIL,MOTRIN) 200 MG tablet, Take 200-800 mg by mouth every 6 (six) hours as needed for fever, headache, mild pain, moderate pain or cramping. , Disp: , Rfl:  .  loratadine (CLARITIN) 10 MG tablet, Take 10 mg by mouth daily., Disp: , Rfl:  .  losartan-hydrochlorothiazide (HYZAAR) 50-12.5 MG per tablet, Take 1 tablet by mouth daily., Disp: , Rfl:  .  metFORMIN (GLUMETZA) 500 MG (MOD) 24 hr tablet, Take 500 mg by mouth daily with breakfast., Disp: , Rfl:  .  mometasone (ASMANEX 60 METERED DOSES) 220 MCG/INH inhaler, Inhale 2 puffs into the lungs daily., Disp: 1 Inhaler, Rfl: 12 .  Multiple Vitamin (MULTIVITAMIN WITH MINERALS) TABS tablet, Take 1 tablet by mouth daily., Disp: , Rfl:  .  PROAIR HFA 108 (90 Base) MCG/ACT inhaler, INL 2 PFS PO INTO THE LUNGS BID PRN, Disp: , Rfl: 0 .  rosuvastatin (CRESTOR) 5 MG tablet,  Take 5 mg by mouth daily. , Disp: , Rfl:  .  terbinafine (LAMISIL) 250 MG tablet, Take 250 mg by mouth at bedtime., Disp: , Rfl: 2      Objective:   Vitals:   05/31/18 0939  BP: 130/80  Pulse: 79  SpO2: 94%  Weight: 170 lb (77.1 kg)  Height: 5\' 9"  (1.753 m)    Estimated body mass index is 25.1 kg/m as calculated from the following:   Height as of this encounter: 5\' 9"  (1.753 m).   Weight as of this encounter: 170 lb (77.1 kg).  @WEIGHTCHANGE @  [employer]   05/31/18 0939  Weight: 170 lb (77.1 kg)     Physical Exam  General Appearance:    Alert, cooperative, no distress, appears stated age - yes , Deconditioned looking - no , OBESE  - no, Sitting on Wheelchair -  no  Head:    Normocephalic, without obvious abnormality, atraumatic  Eyes:    PERRL, conjunctiva/corneas clear,  Ears:    Normal TM's and external ear canals, both ears  Nose:   Nares normal, septum midline, mucosa normal, no drainage    or sinus tenderness. OXYGEN ON  - no . Patient is @ ra   Throat:   Lips, mucosa, and tongue normal; teeth and gums normal. Cyanosis on lips - no  Neck:   Supple, symmetrical, trachea midline, no adenopathy;    thyroid:  no enlargement/tenderness/nodules; no carotid   bruit or JVD  Back:     Symmetric, no curvature, ROM normal, no CVA tenderness  Lungs:  Distress - no , Wheeze no, Barrell Chest - no, Purse lip breathing - no, Crackles - no   Chest Wall:    No tenderness or deformity.    Heart:    Regular rate and rhythm, S1 and S2 normal, no rub   or gallop, Murmur - no  Breast Exam:    NOT DONE  Abdomen:     Soft, non-tender, bowel sounds active all four quadrants,    no masses, no organomegaly. Visceral obesity - no  Genitalia:   NOT DONE  Rectal:   NOT DONE  Extremities:   Extremities - normal, Has Cane - no, Clubbing - no, Edema - no  Pulses:   2+ and symmetric all extremities  Skin:   Stigmata of Connective Tissue Disease - no  Lymph nodes:   Cervical,  supraclavicular, and axillary nodes normal  Psychiatric:  Neurologic:   Pleasant - yes, Anxious - no, Flat affect - no  CAm-ICU - neg, Alert and Oriented x 3 - yes, Moves all 4s - yes, Speech - normal, Cognition - intact           Assessment:       ICD-10-CM   1. 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 Brand Males, M.D., F.C.C.P,  Pulmonary and Critical Care Medicine Staff Physician, [place] Director - Interstitial Lung Disease  Program  [facility] [place], [place], [address]  Pager: [phone], If no answer or between  15:00h - 7:00h: call [phone] Telephone: [phone]  10:00 AM 05/31/2018

## 2018-05-31 NOTE — Patient Instructions (Signed)
ICD-10-CM   1. 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

## 2018-08-30 DIAGNOSIS — L812 Freckles: Secondary | ICD-10-CM | POA: Diagnosis not present

## 2018-08-30 DIAGNOSIS — B351 Tinea unguium: Secondary | ICD-10-CM | POA: Diagnosis not present

## 2018-08-30 DIAGNOSIS — L820 Inflamed seborrheic keratosis: Secondary | ICD-10-CM | POA: Diagnosis not present

## 2018-08-30 DIAGNOSIS — L570 Actinic keratosis: Secondary | ICD-10-CM | POA: Diagnosis not present

## 2018-08-30 DIAGNOSIS — L821 Other seborrheic keratosis: Secondary | ICD-10-CM | POA: Diagnosis not present

## 2018-08-30 DIAGNOSIS — L219 Seborrheic dermatitis, unspecified: Secondary | ICD-10-CM | POA: Diagnosis not present

## 2018-08-30 DIAGNOSIS — D229 Melanocytic nevi, unspecified: Secondary | ICD-10-CM | POA: Diagnosis not present

## 2018-08-30 DIAGNOSIS — L578 Other skin changes due to chronic exposure to nonionizing radiation: Secondary | ICD-10-CM | POA: Diagnosis not present

## 2018-08-30 DIAGNOSIS — D225 Melanocytic nevi of trunk: Secondary | ICD-10-CM | POA: Diagnosis not present

## 2018-08-30 DIAGNOSIS — Z1283 Encounter for screening for malignant neoplasm of skin: Secondary | ICD-10-CM | POA: Diagnosis not present

## 2018-08-31 DIAGNOSIS — E119 Type 2 diabetes mellitus without complications: Secondary | ICD-10-CM | POA: Diagnosis not present

## 2018-08-31 DIAGNOSIS — Z1159 Encounter for screening for other viral diseases: Secondary | ICD-10-CM | POA: Diagnosis not present

## 2018-08-31 DIAGNOSIS — I10 Essential (primary) hypertension: Secondary | ICD-10-CM | POA: Diagnosis not present

## 2018-08-31 DIAGNOSIS — E7800 Pure hypercholesterolemia, unspecified: Secondary | ICD-10-CM | POA: Diagnosis not present

## 2018-08-31 DIAGNOSIS — N400 Enlarged prostate without lower urinary tract symptoms: Secondary | ICD-10-CM | POA: Diagnosis not present

## 2018-09-14 DIAGNOSIS — Z0000 Encounter for general adult medical examination without abnormal findings: Secondary | ICD-10-CM | POA: Diagnosis not present

## 2018-09-14 DIAGNOSIS — Z1389 Encounter for screening for other disorder: Secondary | ICD-10-CM | POA: Diagnosis not present

## 2018-09-28 DIAGNOSIS — H5203 Hypermetropia, bilateral: Secondary | ICD-10-CM | POA: Diagnosis not present

## 2018-09-28 DIAGNOSIS — E119 Type 2 diabetes mellitus without complications: Secondary | ICD-10-CM | POA: Diagnosis not present

## 2018-09-28 DIAGNOSIS — H2513 Age-related nuclear cataract, bilateral: Secondary | ICD-10-CM | POA: Diagnosis not present

## 2018-09-28 DIAGNOSIS — R972 Elevated prostate specific antigen [PSA]: Secondary | ICD-10-CM | POA: Diagnosis not present

## 2018-10-02 DIAGNOSIS — N401 Enlarged prostate with lower urinary tract symptoms: Secondary | ICD-10-CM | POA: Diagnosis not present

## 2018-10-02 DIAGNOSIS — R3914 Feeling of incomplete bladder emptying: Secondary | ICD-10-CM | POA: Diagnosis not present

## 2018-10-02 DIAGNOSIS — R351 Nocturia: Secondary | ICD-10-CM | POA: Diagnosis not present

## 2018-12-04 DIAGNOSIS — L820 Inflamed seborrheic keratosis: Secondary | ICD-10-CM | POA: Diagnosis not present

## 2018-12-04 DIAGNOSIS — L570 Actinic keratosis: Secondary | ICD-10-CM | POA: Diagnosis not present

## 2018-12-04 DIAGNOSIS — L821 Other seborrheic keratosis: Secondary | ICD-10-CM | POA: Diagnosis not present

## 2018-12-04 DIAGNOSIS — L578 Other skin changes due to chronic exposure to nonionizing radiation: Secondary | ICD-10-CM | POA: Diagnosis not present

## 2018-12-04 DIAGNOSIS — L812 Freckles: Secondary | ICD-10-CM | POA: Diagnosis not present

## 2019-01-08 DIAGNOSIS — M25561 Pain in right knee: Secondary | ICD-10-CM | POA: Diagnosis not present

## 2019-01-08 DIAGNOSIS — G8929 Other chronic pain: Secondary | ICD-10-CM | POA: Diagnosis not present

## 2019-01-15 DIAGNOSIS — E119 Type 2 diabetes mellitus without complications: Secondary | ICD-10-CM | POA: Diagnosis not present

## 2019-01-15 DIAGNOSIS — N400 Enlarged prostate without lower urinary tract symptoms: Secondary | ICD-10-CM | POA: Diagnosis not present

## 2019-01-15 DIAGNOSIS — J45991 Cough variant asthma: Secondary | ICD-10-CM | POA: Diagnosis not present

## 2019-01-15 DIAGNOSIS — I10 Essential (primary) hypertension: Secondary | ICD-10-CM | POA: Diagnosis not present

## 2019-01-15 DIAGNOSIS — E7800 Pure hypercholesterolemia, unspecified: Secondary | ICD-10-CM | POA: Diagnosis not present

## 2019-01-24 DIAGNOSIS — L570 Actinic keratosis: Secondary | ICD-10-CM | POA: Diagnosis not present

## 2019-01-25 ENCOUNTER — Telehealth: Payer: Self-pay | Admitting: Internal Medicine

## 2019-01-25 MED ORDER — ASMANEX (60 METERED DOSES) 220 MCG/INH IN AEPB: 2.0000 | INHALATION_SPRAY | Freq: Every day | RESPIRATORY_TRACT | 3 refills | Status: DC

## 2019-01-25 NOTE — Telephone Encounter (Signed)
Call returned to [place] with [place]. Confirmed patient, DOB, medication, and pharmacy. Refill sent. Nothing further is needed at this time.

## 2019-02-14 DIAGNOSIS — N400 Enlarged prostate without lower urinary tract symptoms: Secondary | ICD-10-CM | POA: Diagnosis not present

## 2019-02-14 DIAGNOSIS — E7800 Pure hypercholesterolemia, unspecified: Secondary | ICD-10-CM | POA: Diagnosis not present

## 2019-02-14 DIAGNOSIS — J45991 Cough variant asthma: Secondary | ICD-10-CM | POA: Diagnosis not present

## 2019-02-14 DIAGNOSIS — I10 Essential (primary) hypertension: Secondary | ICD-10-CM | POA: Diagnosis not present

## 2019-02-14 DIAGNOSIS — E119 Type 2 diabetes mellitus without complications: Secondary | ICD-10-CM | POA: Diagnosis not present

## 2019-02-20 ENCOUNTER — Ambulatory Visit (INDEPENDENT_AMBULATORY_CARE_PROVIDER_SITE_OTHER): Payer: PPO | Admitting: Internal Medicine

## 2019-02-20 ENCOUNTER — Encounter: Payer: Self-pay | Admitting: Internal Medicine

## 2019-02-20 ENCOUNTER — Other Ambulatory Visit: Payer: Self-pay

## 2019-02-20 VITALS — BP 130/70 | HR 106 | Temp 97.8°F | Ht 68.75 in | Wt 169.6 lb

## 2019-02-20 DIAGNOSIS — J45991 Cough variant asthma: Secondary | ICD-10-CM | POA: Diagnosis not present

## 2019-02-20 DIAGNOSIS — Z87891 Personal history of nicotine dependence: Secondary | ICD-10-CM

## 2019-02-20 DIAGNOSIS — R059 Cough, unspecified: Secondary | ICD-10-CM

## 2019-02-20 DIAGNOSIS — R05 Cough: Secondary | ICD-10-CM | POA: Diagnosis not present

## 2019-02-20 DIAGNOSIS — R053 Chronic cough: Secondary | ICD-10-CM

## 2019-02-20 NOTE — Patient Instructions (Addendum)
ICD-10-CM   1. Chronic cough  R05   2. Cough variant asthma  J45.991   3. Former smoker, stopped smoking in distant past  Z87.891     Cough is some worse chronically based on your description and symptom score Unclear if just a variation or if something else going on  Plan  - do HRCT supine and prone at your convenience next few weeks - cotninue  Followup  - will call with CT results   - otherwise 3-6 months but sooner if needed

## 2019-02-20 NOTE — Progress Notes (Signed)
PCP Leighton Ruff, MD  HPI    IOV  07/08/2017  Chief Complaint  Patient presents with  . Advice Only    Referred by Dr. Drema Dallas for cough x54months.  Pt states that his cough is  a dry cough. Pt was put on nexium x2 weeks for acid reflux which he states has loosened the mucus some.      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  He tells me that mid September 2018 he went to the beach.  Upon return from the beach his granddaughter was sick with a cough and subsequently on April 16, 2017 he abruptly developed a cough.  Since then the cough has persisted.  The only thing that has improved and his cough is that he is no longer having significant night cough although he still does have some  amount of night cough.  This night cough resolved after opioid cough syrup.  Cough is made worse by talking, laughing and also randomly.  It is associated with clearing of the throat.  He feels a tightness in the upper chest.  There is no associated wheezing but there is still some residual nocturnal symptoms.  There is no associated acid reflux.  He has been on Nexium for a week or 2 without much relief.  He has not tried anything for nasal but he denies any nasal discharge.  He is not on any ACE inhibitors.  He does not have a previous history of asthma but his exam nitric oxide today is elevated borderline at 44 ppb.  He does clear the throat.  The cough is annoying.  He tells me that he did have a chest x-ray with his primary care physician that was clear.  The history is obtained from him, talking to his wife and review of the primary care physician referral notes.  Lab review she had a hemoglobin 11.6 g% in February 2016 and a eosinophils f 300 cells.  He is a former smoker  He does not have any shortness of breath.  He walks over a mile.  Sometimes he will cough and a pro-air can help him.  He does find the [employer] helps him.  At this  point in time he prefers for conservative simplistic line of treatment   OV 08/18/2017  Chief Complaint  Patient presents with  . Follow-up    Pt states he has been doing good since last visit. Cough is better.   Follow-up chronic cough  After last visit he decided to take Asmanex.  He also followed a diet and took Prilosec.  With this the cough is more than 60% better as documented in the RSI cough score.  He does not want to take any more new medications.  He feels prednisone burst helped him a lot in the initial few days.  He is wondering which of these measures have helped him the most.  There are no new issues.  He wants to liberalize his diet which he says is actually helping him with the sugars and with the cough but he would like to be less disciplined about food.  He is willing to continue with Asmanex   feno 43 ppb  OV 02/28/2018  Chief Complaint  Patient presents with  . Follow-up    Pt states things had been doing good for him and did go off of the inhaler but stated after he had been  off of the inhaler for about a week, the cough came back in the mornings and now he states he is coughing all the time. He states he is still not taking the medication and he states he is still coughing.    Follow-up chronic cough  He is here with his wife. He tells me that approximately one month ago because he was feeling well without any cough he stopped his Asmanex and then 2 weeks ago his cough returned. RSI cough score is 17. When he lies downhe has a cough. But he does not wake up in the middle of the night because of cough. There is no associated wheezing or shortness of breath or chest tightness or any change in his health status. Social history: His wife is new diagnosis of liver cirrhosis. She is going MRI today. Exam nitric oxide is in the indeterminate range today for him. Review of the chart indicates last chest x-ray was in 2010.    OV 05/31/2018  Subjective:  Patient ID: Isaac Weber, male , DOB: 09/19/1945 , age 87 y.o. , MRN: [id number] , ADDRESS: [address] [place] [facility] [id number]   05/31/2018 -   Chief Complaint  Patient presents with  . Follow-up    Doing well at this time.     HPI Isaac Weber 73 y.o. -presents for follow-up of cough variant asthma.  Last visit we put him back on Asmanex after his cough deteriorated.  This was in August 2019.  With this his cough is significantly improved RSI cough score is 5.  He realizes the value of taking Asmanex.  He takes his Asmanex 1 puff 2 times daily.  The only interim issues that March 09, 2018 he ended up in the ER lab review shows normal.  It was for dizziness.  Socially his wife Fraser Din has had liver cyst surgery at [facility] in October 2019 and is doing well.  He is up-to-date with his flu shot but our internal immunization record shows that he has not had pneumonia vaccine.        OV 02/20/2019  Subjective:  Patient ID: Isaac Weber, male , DOB: 11-May-1946 , age 50 y.o. , MRN: [id number] , ADDRESS: [address] [place] [facility] [id number]   02/20/2019 -   Chief Complaint  Patient presents with  . Cough    Having more trouble with phlegm.     HPI Isaac Weber 73 y.o. -presents for chronic cough and cough variant asthma.  He is a former smoker 42 pack.  Last chest x-ray was August 2019.  At that time it was clear.  No previous CT scan of the chest.  He tells me that overall he is doing stable although in the last 6 weeks he has had increasing phlegm production.  It is not acute.  He has been socially isolating.  He has not come into contact with anyone with COVID.  He does go to church but mostly does [employer].  His RSI cough score shows significant deterioration as documented below.  The phlegm is not green but more like white.      Dr Lorenza Cambridge Reflux Symptom Index (> 13-15 suggestive of LPR cough) Results for Isaac Weber, Isaac Weber (MRN [id number]) as of 05/31/2018 09:46  Ref. Range 03/09/2018 18:36  03/09/2018 18:45  Eosinophils Absolute Latest Ref Range: 0.0 - 0.7 K/uL 0.4    0 -> 5  =  none ->severe problem.td 07/08/2017  08/18/2017  02/28/2018  05/31/2018  asmanex 1 bid 02/20/2019   Hoarseness of problem with voice 1 2 2 3 3   Clearing  Of Throat 3 2 2  0 2  Excess throat mucus or feeling of post nasal drip 1 0 3 0 3  Difficulty swallowing food, liquid or tablets 0 0 0 0 0  Cough after eating or lying down 4 1 5 1 1   Breathing difficulties or choking episodes 0 0 0 0 0  Troublesome or annoying cough 5 2 5 1 3   Sensation of something sticking in throat or lump in throat 0 0 0 0 0  Heartburn, chest pain, indigestion, or stomach acid coming up 0 0 0 0 0  TOTAL 14 7 17 5 12    ROS - per HPI     has a past medical history of BPH (benign prostatic hyperplasia), Diabetes mellitus without complication ([place]), and Hypertension.   reports that he quit smoking about 47 years ago. His smoking use included cigarettes. He has a 42.00 pack-year smoking history. He has never used smokeless tobacco.  Past Surgical History:  Procedure Laterality Date  . BUNIONECTOMY    . KNEE ARTHROSCOPY      Allergies  Allergen Reactions  . Ciprofloxacin Diarrhea and Nausea And Vomiting  . Duloxetine Other (See Comments)    Severe fatique and constipation.  . Morphine And Related Diarrhea and Nausea And Vomiting    Immunization History  Administered Date(s) Administered  . Influenza, High Dose Seasonal PF 07/15/2017, 04/12/2018  . Zoster Recombinat (Shingrix) 08/17/2017    Family History  Family history unknown: Yes     Current Outpatient Medications:  .  amLODipine (NORVASC) 5 MG tablet, Take 5 mg by mouth daily., Disp: , Rfl:  .  Calcium Carb-Cholecalciferol (CALCIUM + D3 PO), Take by mouth., Disp: , Rfl:  .  calcium carbonate (OSCAL) 1500 (600 Ca) MG TABS tablet, Take 600 mg of elemental calcium by mouth daily., Disp: , Rfl:  .  ferrous sulfate 325 (65 FE) MG tablet, Take 325 mg by mouth  daily with breakfast., Disp: , Rfl:  .  Glucosamine-Chondroitin (GLUCOSAMINE CHONDR COMPLEX PO), Take by mouth 2 (two) times daily. , Disp: , Rfl:  .  ibuprofen (ADVIL,MOTRIN) 200 MG tablet, Take 200-800 mg by mouth every 6 (six) hours as needed for fever, headache, mild pain, moderate pain or cramping. , Disp: , Rfl:  .  loratadine (CLARITIN) 10 MG tablet, Take 10 mg by mouth daily., Disp: , Rfl:  .  losartan-hydrochlorothiazide (HYZAAR) 50-12.5 MG per tablet, Take 1 tablet by mouth daily., Disp: , Rfl:  .  metFORMIN (GLUMETZA) 500 MG (MOD) 24 hr tablet, Take 500 mg by mouth daily with breakfast., Disp: , Rfl:  .  mometasone (ASMANEX, 60 METERED DOSES,) 220 MCG/INH inhaler, Inhale 2 puffs into the lungs daily., Disp: 1 Inhaler, Rfl: 12 .  mometasone (ASMANEX, 60 METERED DOSES,) 220 MCG/INH inhaler, Inhale 2 puffs into the lungs daily., Disp: 3 Inhaler, Rfl: 3 .  Multiple Vitamin (MULTIVITAMIN WITH MINERALS) TABS tablet, Take 1 tablet by mouth daily., Disp: , Rfl:  .  rosuvastatin (CRESTOR) 5 MG tablet, Take 5 mg by mouth daily. , Disp: , Rfl:  .  FREESTYLE LITE test strip, , Disp: , Rfl:  .  hydrochlorothiazide (HYDRODIURIL) 12.5 MG tablet, Take 1 tablet by mouth daily., Disp: , Rfl:  .  ketoconazole (NIZORAL) 2 % shampoo, Apply topically as needed., Disp: , Rfl:  .  PROAIR HFA 108 (90 Base) MCG/ACT inhaler, INL 2  PFS PO INTO THE LUNGS BID PRN, Disp: , Rfl: 0 .  tamsulosin (FLOMAX) 0.4 MG CAPS capsule, Take 1 capsule by mouth daily., Disp: , Rfl:  .  terbinafine (LAMISIL) 250 MG tablet, Take 250 mg by mouth at bedtime., Disp: , Rfl: 2      Objective:   Vitals:   02/20/19 1533  BP: 130/70  Pulse: (!) 106  Temp: 97.8 F (36.6 C)  SpO2: 98%  Weight: 169 lb 9.6 oz (76.9 kg)  Height: 5' 8.75" (1.746 m)    Estimated body mass index is 25.23 kg/m as calculated from the following:   Height as of this encounter: 5' 8.75" (1.746 m).   Weight as of this encounter: 169 lb 9.6 oz (76.9 kg).   @WEIGHTCHANGE @  [employer]   02/20/19 1533  Weight: 169 lb 9.6 oz (76.9 kg)     Physical Exam  General Appearance:    Alert, cooperative, no distress, appears stated age - yes , Deconditioned looking - no , OBESE  - no, Sitting on Wheelchair -  no  Head:    Normocephalic, without obvious abnormality, atraumatic  Eyes:    PERRL, conjunctiva/corneas clear,  Ears:    Normal TM's and external ear canals, both ears  Nose:   Nares normal, septum midline, mucosa normal, no drainage    or sinus tenderness. OXYGEN ON  - no . Patient is @ RA   Throat:   Lips, mucosa, and tongue normal; teeth and gums normal. Cyanosis on lips - no  Neck:   Supple, symmetrical, trachea midline, no adenopathy;    thyroid:  no enlargement/tenderness/nodules; no carotid   bruit or JVD  Back:     Symmetric, no curvature, ROM normal, no CVA tenderness  Lungs:     Distress - no , Wheeze no, Barrell Chest - no, Purse lip breathing - no, Crackles - no   Chest Wall:    No tenderness or deformity.    Heart:    Regular rate and rhythm, S1 and S2 normal, no rub   or gallop, Murmur - no  Breast Exam:    NOT DONE  Abdomen:     Soft, non-tender, bowel sounds active all four quadrants,    no masses, no organomegaly. Visceral obesity - no  Genitalia:   NOT DONE  Rectal:   NOT DONE  Extremities:   Extremities - normal, Has Cane - no, Clubbing - no, Edema - no  Pulses:   2+ and symmetric all extremities  Skin:   Stigmata of Connective Tissue Disease - no  Lymph nodes:   Cervical, supraclavicular, and axillary nodes normal  Psychiatric:  Neurologic:   Pleasant - yes, Anxious - no, Flat affect - no  CAm-ICU - neg, Alert and Oriented x 3 - yes, Moves all 4s - yes, Speech - normal, Cognition - intact           Assessment:       ICD-10-CM   1. Chronic cough  R05   2. Cough variant asthma  J45.991   3. Former smoker, stopped smoking in distant past  Z87.891        Plan:     Patient Instructions     ICD-10-CM    1. Chronic cough  R05   2. Cough variant asthma  J45.991   3. Former smoker, stopped smoking in distant past  Z87.891     Cough is some worse chronically based on your description and symptom score Unclear if  just a variation or if something else going on  Plan  - do HRCT supine and prone at your convenience next few weeks - cotninue  Followup  - will call with CT results   - otherwise 3-6 months but sooner if needed     SIGNATURE    Dr. Brand Males, M.D., F.C.C.P,  Pulmonary and Critical Care Medicine Staff Physician, [place] Director - Interstitial Lung Disease  Program  Pulmonary [place] at [place], [place], [address]  Pager: [phone], If no answer or between  15:00h - 7:00h: call [phone] Telephone: [phone]  4:37 PM 02/20/2019

## 2019-02-26 DIAGNOSIS — Z1159 Encounter for screening for other viral diseases: Secondary | ICD-10-CM | POA: Diagnosis not present

## 2019-02-26 DIAGNOSIS — E119 Type 2 diabetes mellitus without complications: Secondary | ICD-10-CM | POA: Diagnosis not present

## 2019-02-26 DIAGNOSIS — I10 Essential (primary) hypertension: Secondary | ICD-10-CM | POA: Diagnosis not present

## 2019-02-26 DIAGNOSIS — E7800 Pure hypercholesterolemia, unspecified: Secondary | ICD-10-CM | POA: Diagnosis not present

## 2019-02-26 DIAGNOSIS — N400 Enlarged prostate without lower urinary tract symptoms: Secondary | ICD-10-CM | POA: Diagnosis not present

## 2019-03-01 DIAGNOSIS — N400 Enlarged prostate without lower urinary tract symptoms: Secondary | ICD-10-CM | POA: Diagnosis not present

## 2019-03-01 DIAGNOSIS — E7800 Pure hypercholesterolemia, unspecified: Secondary | ICD-10-CM | POA: Diagnosis not present

## 2019-03-01 DIAGNOSIS — I10 Essential (primary) hypertension: Secondary | ICD-10-CM | POA: Diagnosis not present

## 2019-03-01 DIAGNOSIS — M2550 Pain in unspecified joint: Secondary | ICD-10-CM | POA: Diagnosis not present

## 2019-03-01 DIAGNOSIS — Z7984 Long term (current) use of oral hypoglycemic drugs: Secondary | ICD-10-CM | POA: Diagnosis not present

## 2019-03-01 DIAGNOSIS — E119 Type 2 diabetes mellitus without complications: Secondary | ICD-10-CM | POA: Diagnosis not present

## 2019-03-01 DIAGNOSIS — J45991 Cough variant asthma: Secondary | ICD-10-CM | POA: Diagnosis not present

## 2019-03-13 ENCOUNTER — Other Ambulatory Visit: Payer: Self-pay

## 2019-03-13 ENCOUNTER — Ambulatory Visit (INDEPENDENT_AMBULATORY_CARE_PROVIDER_SITE_OTHER)
Admission: RE | Admit: 2019-03-13 | Discharge: 2019-03-13 | Disposition: A | Discharge status: Home / Self Care | Payer: PPO | Source: Ambulatory Visit | Attending: Internal Medicine | Admitting: Internal Medicine

## 2019-03-13 DIAGNOSIS — I700 Atherosclerosis of aorta: Secondary | ICD-10-CM | POA: Diagnosis not present

## 2019-03-13 DIAGNOSIS — I2510 Atherosclerotic heart disease of native coronary artery without angina pectoris: Secondary | ICD-10-CM | POA: Diagnosis not present

## 2019-03-13 DIAGNOSIS — J439 Emphysema, unspecified: Secondary | ICD-10-CM | POA: Diagnosis not present

## 2019-03-13 DIAGNOSIS — M47814 Spondylosis without myelopathy or radiculopathy, thoracic region: Secondary | ICD-10-CM | POA: Diagnosis not present

## 2019-03-13 DIAGNOSIS — J9809 Other diseases of bronchus, not elsewhere classified: Secondary | ICD-10-CM | POA: Diagnosis not present

## 2019-03-13 DIAGNOSIS — R05 Cough: Secondary | ICD-10-CM | POA: Diagnosis not present

## 2019-03-13 DIAGNOSIS — R911 Solitary pulmonary nodule: Secondary | ICD-10-CM | POA: Diagnosis not present

## 2019-03-13 DIAGNOSIS — R059 Cough, unspecified: Secondary | ICD-10-CM

## 2019-03-19 ENCOUNTER — Telehealth: Payer: Self-pay | Admitting: Internal Medicine

## 2019-03-19 NOTE — Telephone Encounter (Signed)
Isaac Weber cell and home to give results- got him at home. Gave the following results  TriagE:   1. Please send results top PCP Leighton Ruff, MD including my p-hone note  2. Send results to patient as well via mail   3. Give fu in 6 months to see me   Thanks    SIGNATURE    Dr. Brand Males, M.D., F.C.C.P,  Pulmonary and Critical Care Medicine Staff Physician, [place] Director - Interstitial Lung Disease  Program  Pulmonary [place] at [place], [place], [address]  Pager: [phone], If no answer or between  15:00h - 7:00h: call [phone] Telephone: [phone]  6:50 PM 03/19/2019     IMPRESSION: 1. No evidence of interstitial lung disease. 2. Mild paraseptal emphysema, mild diffuse bronchial wall thickening and mild saber sheath trachea configuration, suggesting COPD. - > in futrue can try inhaler rotation to eg: spirivan 3. Solitary left upper lobe 4 mm solid pulmonary nodule. No follow-up needed if patient is low-risk. Non-contrast chest CT can be considered in 12 months if patient is high-risk. This recommendation follows the consensus statement: Guidelines for Management of Incidental Pulmonary Nodules Detected on CT Images:From the Fleischner Society 2017; published online before print (10.1148/radiol.SG:5268862). 4. 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 -> definitely talk to PCP Leighton Ruff, MD  5. Two vessel coronary atherosclerosis. -> explained to consider cardiac evaluation afer d/w PCP Leighton Ruff, MD   Aortic Atherosclerosis (ICD10-I70.0) and Emphysema (ICD10-J43.9).   Electronically Signed   By: Ilona Sorrel M.D.   On: 03/13/2019 15:04    reports that he quit smoking about 47 years ago. His smoking use  included cigarettes. He has a 42.00 pack-year smoking history. He has never used smokeless tobacco.

## 2019-03-20 NOTE — Telephone Encounter (Signed)
  Information sent to Dr. Carolyne Littles results to pt via mail  6 month recall placed.   Nothing further is needed.

## 2019-03-22 ENCOUNTER — Telehealth: Payer: Self-pay

## 2019-03-22 NOTE — Telephone Encounter (Signed)
NOTES ON FILE FROM [place] [facility] [phone] SENT REFERRAL TO SCHEDULING

## 2019-03-27 ENCOUNTER — Other Ambulatory Visit: Payer: Self-pay | Admitting: Family Medicine

## 2019-03-27 DIAGNOSIS — R9389 Abnormal findings on diagnostic imaging of other specified body structures: Secondary | ICD-10-CM

## 2019-04-03 NOTE — Progress Notes (Signed)
Cardiology Office Note   Date:  04/04/2019   ID:  Isaac Weber 12/27/1945, MRN [id number]  PCP:  Isaac Ruff, MD    No chief complaint on file.  Coronary calcification  Wt Readings from Last 3 Encounters:  04/04/19 171 lb (77.6 kg)  02/20/19 169 lb 9.6 oz (76.9 kg)  05/31/18 170 lb (77.1 kg)       History of Present Illness: Isaac Weber is a 73 y.o. male who is being seen today for the evaluation of coronary calcification at the request of Isaac Ruff, MD.  Chest CT scan in 02/2019 showed: IMPRESSION: 1. No evidence of interstitial lung disease. 2. Mild paraseptal emphysema, mild diffuse bronchial wall thickening and mild saber sheath trachea configuration, suggesting COPD. 3. Solitary left upper lobe 4 mm solid pulmonary nodule. No follow-up needed if patient is low-risk. Non-contrast chest CT can be considered in 12 months if patient is high-risk. This recommendation follows the consensus statement: Guidelines for Management of Incidental Pulmonary Nodules Detected on CT Images:From the Fleischner Society 2017; published online before print (10.1148/radiol.SG:5268862). 4. 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 5. Two vessel coronary atherosclerosis. Left anterior descending and left circumflex coronary atherosclerosis.  MRI planned for pancreas.   Denies : exertional Chest pain. Dizziness. Leg edema. Nitroglycerin use. Orthopnea. Palpitations. Paroxysmal nocturnal dyspnea. Shortness of breath. Syncope.   He can have a soreness in his breastbone if he swings a golf club too hard.  He had a stress test in 2014 that was negative, for the same chest pain.    Past Medical History:  Diagnosis Date  . Abnormal chest CT   . Allergic rhinitis, unspecified   . Anemia   . Aortic atherosclerosis ([place])   . BPH  (benign prostatic hyperplasia)   . COPD (chronic obstructive pulmonary disease) ([place])   . Coronary artery disease   . Cough variant asthma   . Diabetes mellitus without complication ([place])   . Hypertension   . Other chronic pain   . Pancreatic lesion   . Pure hypercholesterolemia, unspecified     Past Surgical History:  Procedure Laterality Date  . BUNIONECTOMY    . CERVICAL DISC SURGERY    . KNEE ARTHROSCOPY    . LEG SURGERY Left    BENIGN BONE TUMOR  . PROSTATE SURGERY    . ROTATOR CUFF REPAIR Bilateral      Current Outpatient Medications  Medication Sig Dispense Refill  . amLODipine (NORVASC) 5 MG tablet Take 5 mg by mouth daily.    . Calcium Carb-Cholecalciferol (CALCIUM + D3 PO) Take by mouth.    . calcium carbonate (OSCAL) 1500 (600 Ca) MG TABS tablet Take 600 mg of elemental calcium by mouth daily.    . diclofenac sodium (VOLTAREN) 1 % GEL Apply topically 3 (three) times daily.    . ferrous sulfate 325 (65 FE) MG tablet Take 325 mg by mouth daily with breakfast.    . FREESTYLE LITE test strip     . Glucosamine-Chondroitin (GLUCOSAMINE CHONDR COMPLEX PO) Take by mouth 2 (two) times daily.     . hydrochlorothiazide (HYDRODIURIL) 12.5 MG tablet Take 1 tablet by mouth daily.    Marland Kitchen ibuprofen (ADVIL,MOTRIN) 200 MG tablet Take 200-800 mg by mouth every 6 (six) hours as needed for fever, headache, mild pain, moderate pain or cramping.     Marland Kitchen ketoconazole (NIZORAL) 2 %  shampoo Apply topically as needed.    . loratadine (CLARITIN) 10 MG tablet Take 10 mg by mouth daily.    Marland Kitchen losartan-hydrochlorothiazide (HYZAAR) 50-12.5 MG per tablet Take 1 tablet by mouth daily.    . meclizine (ANTIVERT) 25 MG tablet Take 25 mg by mouth daily.    . metFORMIN (GLUMETZA) 500 MG (MOD) 24 hr tablet Take 500 mg by mouth daily with breakfast.    . mometasone (ASMANEX, 60 METERED DOSES,) 220 MCG/INH inhaler Inhale 2 puffs into the lungs daily. 1 Inhaler 12  . mometasone (ASMANEX, 60 METERED DOSES,) 220  MCG/INH inhaler Inhale 2 puffs into the lungs daily. 3 Inhaler 3  . Multiple Vitamin (MULTIVITAMIN WITH MINERALS) TABS tablet Take 1 tablet by mouth daily.    Marland Kitchen PROAIR HFA 108 (90 Base) MCG/ACT inhaler INL 2 PFS PO INTO THE LUNGS BID PRN  0  . rosuvastatin (CRESTOR) 5 MG tablet Take 5 mg by mouth daily.     . tamsulosin (FLOMAX) 0.4 MG CAPS capsule Take 1 capsule by mouth daily.    Marland Kitchen terbinafine (LAMISIL) 250 MG tablet Take 250 mg by mouth at bedtime.  2   No current facility-administered medications for this visit.     Allergies:   Ciprofloxacin, Duloxetine, and Morphine and related    Social History:  The patient  reports that he quit smoking about 47 years ago. His smoking use included cigarettes. He has a 42.00 pack-year smoking history. He has never used smokeless tobacco. He reports that he does not drink alcohol or use drugs.   Family History:  The patient's *Family history is unknown by patient. Brother with CABG- smoker   ROS:  Please see the history of present illness.   Otherwise, review of systems are positive for chest pain.   All other systems are reviewed and negative.    PHYSICAL EXAM: VS:  BP 126/70   Pulse 78   Ht 5' 8.75" (1.746 m)   Wt 171 lb (77.6 kg)   SpO2 98%   BMI 25.44 kg/m  , BMI Body mass index is 25.44 kg/m. GEN: Well nourished, well developed, in no acute distress  HEENT: normal  Neck: no JVD, carotid bruits, or masses Cardiac: RRR; no murmurs, rubs, or gallops,no edema  Respiratory:  clear to auscultation bilaterally, normal work of breathing GI: soft, nontender, nondistended, + BS MS: no deformity or atrophy  Skin: warm and dry, no rash Neuro:  Strength and sensation are intact Psych: euthymic mood, full affect   EKG:   The ekg ordered today demonstrates NSR, LAD, no ST changes   Recent Labs: No results found for requested labs within last 8760 hours.   Lipid Panel No results found for: CHOL, TRIG, HDL, CHOLHDL, VLDL, LDLCALC,  LDLDIRECT   Other studies Reviewed: Additional studies/ records that were reviewed today with results demonstrating: chest CT reviewed .   ASSESSMENT AND PLAN:  1. Coronary calcification: No angina.  COntinue aggressive secondary prevention.  LDL 69. TG 52.  He will let us know about any exertional chest pain or change in exercise tolerance.  Will wait for result of pancreas CT scan and see what further cardiac treatment may be needed.  2. HTN: The current medical regimen is effective;  continue present plan and medications. 3. DM: A1C 6.4.   Current medicines are reviewed at length with the patient today.  The patient concerns regarding his medicines were addressed.  The following changes have been made:  No change  Labs/ tests ordered today include:  No orders of the defined types were placed in this encounter.   Recommend 150 minutes/week of aerobic exercise Low fat, low carb, high fiber diet recommended  Disposition:   FU in 6 weeks- video visit   Signed, Larae Grooms, MD  04/04/2019 11:36 AM    [facility] [address], [place], [place]  [address] Phone: [phone]; Fax: [phone]

## 2019-04-04 ENCOUNTER — Other Ambulatory Visit: Payer: Self-pay

## 2019-04-04 ENCOUNTER — Encounter: Payer: Self-pay | Admitting: Interventional Cardiology

## 2019-04-04 ENCOUNTER — Ambulatory Visit: Payer: PPO | Admitting: Interventional Cardiology

## 2019-04-04 VITALS — BP 126/70 | HR 78 | Ht 68.75 in | Wt 171.0 lb

## 2019-04-04 DIAGNOSIS — I2510 Atherosclerotic heart disease of native coronary artery without angina pectoris: Secondary | ICD-10-CM

## 2019-04-04 DIAGNOSIS — I10 Essential (primary) hypertension: Secondary | ICD-10-CM | POA: Diagnosis not present

## 2019-04-04 DIAGNOSIS — E119 Type 2 diabetes mellitus without complications: Secondary | ICD-10-CM | POA: Diagnosis not present

## 2019-04-04 NOTE — Patient Instructions (Signed)
Medication Instructions:  Your physician recommends that you continue on your current medications as directed. Please refer to the Current Medication list given to you today.  If you need a refill on your cardiac medications before your next appointment, please call your pharmacy.   Lab work: None Ordered  If you have labs (blood work) drawn today and your tests are completely normal, you will receive your results only by: Marland Kitchen MyChart Message (if you have MyChart) OR . A paper copy in the mail If you have any lab test that is abnormal or we need to change your treatment, we will call you to review the results.  Testing/Procedures: None Ordered  Follow-Up: At [facility], you and your health needs are our priority.  As part of our continuing mission to provide you with exceptional heart care, we have created designated Provider Care Teams.  These Care Teams include your primary Cardiologist (physician) and Advanced Practice Providers (APPs -  Physician Assistants and Nurse Practitioners) who all work together to provide you with the care you need, when you need it. You will need a follow up appointment in 6 weeks.  You may see No primary care provider on file. Dr. Irish Lack or one of the following Advanced Practice Providers on your designated Care Team:   [place], PA-C Melina Copa, PA-C . Ermalinda Barrios, PA-C  Any Other Special Instructions Will Be Listed Below (If Applicable).

## 2019-04-12 DIAGNOSIS — J449 Chronic obstructive pulmonary disease, unspecified: Secondary | ICD-10-CM | POA: Diagnosis not present

## 2019-04-12 DIAGNOSIS — J45991 Cough variant asthma: Secondary | ICD-10-CM | POA: Diagnosis not present

## 2019-04-12 DIAGNOSIS — E7800 Pure hypercholesterolemia, unspecified: Secondary | ICD-10-CM | POA: Diagnosis not present

## 2019-04-12 DIAGNOSIS — E119 Type 2 diabetes mellitus without complications: Secondary | ICD-10-CM | POA: Diagnosis not present

## 2019-04-12 DIAGNOSIS — I10 Essential (primary) hypertension: Secondary | ICD-10-CM | POA: Diagnosis not present

## 2019-04-12 DIAGNOSIS — N400 Enlarged prostate without lower urinary tract symptoms: Secondary | ICD-10-CM | POA: Diagnosis not present

## 2019-04-12 DIAGNOSIS — I2510 Atherosclerotic heart disease of native coronary artery without angina pectoris: Secondary | ICD-10-CM | POA: Diagnosis not present

## 2019-04-24 ENCOUNTER — Ambulatory Visit
Admission: RE | Admit: 2019-04-24 | Discharge: 2019-04-24 | Disposition: A | Discharge status: Home / Self Care | Payer: PPO | Source: Ambulatory Visit | Attending: Family Medicine | Admitting: Family Medicine

## 2019-04-24 DIAGNOSIS — N281 Cyst of kidney, acquired: Secondary | ICD-10-CM | POA: Diagnosis not present

## 2019-04-24 DIAGNOSIS — K7689 Other specified diseases of liver: Secondary | ICD-10-CM | POA: Diagnosis not present

## 2019-04-24 DIAGNOSIS — R9389 Abnormal findings on diagnostic imaging of other specified body structures: Secondary | ICD-10-CM

## 2019-04-24 DIAGNOSIS — D734 Cyst of spleen: Secondary | ICD-10-CM | POA: Diagnosis not present

## 2019-04-24 DIAGNOSIS — K862 Cyst of pancreas: Secondary | ICD-10-CM | POA: Diagnosis not present

## 2019-04-24 MED ORDER — GADOBENATE DIMEGLUMINE 529 MG/ML IV SOLN
16.0000 mL | Freq: Once | INTRAVENOUS | Status: AC | PRN
  Administered 2019-04-24: 16 mL via INTRAVENOUS

## 2019-04-26 NOTE — Telephone Encounter (Signed)
Thanks for keeping me in loop and appreciate your feedback.  I am at your service

## 2019-04-26 NOTE — Telephone Encounter (Signed)
MR please see email pt sent:  Isaac Weber sent to [facility]  Phone Number: [phone]        Doctor, I just wanted to thank you for sending me for the CT scan, as you know they found 4 things wrong to being lung issues. Then two being Heart issues well the Cardiologist did and EKG and the results were the same as 6 years ago.I have a virtual visit on the 27th of October. 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 I know your busy but I just felt I wanted to thank you and keep in the loop.

## 2019-05-10 DIAGNOSIS — E119 Type 2 diabetes mellitus without complications: Secondary | ICD-10-CM | POA: Diagnosis not present

## 2019-05-10 DIAGNOSIS — N400 Enlarged prostate without lower urinary tract symptoms: Secondary | ICD-10-CM | POA: Diagnosis not present

## 2019-05-10 DIAGNOSIS — J449 Chronic obstructive pulmonary disease, unspecified: Secondary | ICD-10-CM | POA: Diagnosis not present

## 2019-05-10 DIAGNOSIS — E7800 Pure hypercholesterolemia, unspecified: Secondary | ICD-10-CM | POA: Diagnosis not present

## 2019-05-10 DIAGNOSIS — J45991 Cough variant asthma: Secondary | ICD-10-CM | POA: Diagnosis not present

## 2019-05-10 DIAGNOSIS — I2510 Atherosclerotic heart disease of native coronary artery without angina pectoris: Secondary | ICD-10-CM | POA: Diagnosis not present

## 2019-05-10 DIAGNOSIS — I10 Essential (primary) hypertension: Secondary | ICD-10-CM | POA: Diagnosis not present

## 2019-05-16 DIAGNOSIS — M9903 Segmental and somatic dysfunction of lumbar region: Secondary | ICD-10-CM | POA: Diagnosis not present

## 2019-05-16 DIAGNOSIS — M5432 Sciatica, left side: Secondary | ICD-10-CM | POA: Diagnosis not present

## 2019-05-16 DIAGNOSIS — M9905 Segmental and somatic dysfunction of pelvic region: Secondary | ICD-10-CM | POA: Diagnosis not present

## 2019-05-16 DIAGNOSIS — M5136 Other intervertebral disc degeneration, lumbar region: Secondary | ICD-10-CM | POA: Diagnosis not present

## 2019-05-21 ENCOUNTER — Encounter: Payer: Self-pay | Admitting: Interventional Cardiology

## 2019-05-21 NOTE — Progress Notes (Signed)
Virtual Visit via Video Note   This visit type was conducted due to national recommendations for restrictions regarding the COVID-19 Pandemic (e.g. social distancing) in an effort to limit this patient's exposure and mitigate transmission in our community.  Due to his co-morbid illnesses, this patient is at least at moderate risk for complications without adequate follow up.  This format is felt to be most appropriate for this patient at this time.  All issues noted in this document were discussed and addressed.  A limited physical exam was performed with this format.  Please refer to the patient's chart for his consent to telehealth for [facility].   Date:  05/22/2019   ID:  Isaac Weber, DOB 1946/03/09, MRN [id number]  Patient Location: Home Provider Location: Home  PCP:  Isaac Ruff, MD  Cardiologist:  No primary care provider on file. Marie Electrophysiologist:  None   Evaluation Performed:  Follow-Up Visit  Chief Complaint:  Coronary calcification  History of Present Illness:    Isaac Weber is a 73 y.o. male who is being seen today for the evaluation of coronary calcification at the request of Isaac Ruff, MD.  Chest CT scan in 02/2019 showed: IMPRESSION: 1. No evidence of interstitial lung disease. 2. Mild paraseptal emphysema, mild diffuse bronchial wall thickening and mild saber sheath trachea configuration, suggesting COPD. 3. Solitary left upper lobe 4 mm solid pulmonary nodule. No follow-up needed if patient is low-risk. Non-contrast chest CT can be considered in 12 months if patient is high-risk. This recommendation follows the consensus statement: Guidelines for Management of Incidental Pulmonary Nodules Detected on CT Images:From the Fleischner Society 2017; published online before print (10.1148/radiol.SG:5268862). 4. 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 5. Two vessel coronary atherosclerosis. Left anterior descending and left circumflex coronary atherosclerosis.  Plan in 03/2019 was :"Coronary calcification: No angina.  COntinue aggressive secondary prevention.  LDL 69. TG 52.  He will let us know about any exertional chest pain or change in exercise tolerance.  Will wait for result of pancreas CT scan and see what further cardiac treatment may be needed. 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 These measure up to 1.6 cm and appear unilocular without internal enhancement. There appearance is nonspecific but favors of benign etiology. According to consensus criteria follow-up imaging is indicated. Repeat without and with contrast MRI of the pancreas in 6 months is recommended. "   Denies : Chest pain. Dizziness. Leg edema. Nitroglycerin use. Orthopnea. Palpitations. Paroxysmal nocturnal dyspnea. Syncope.   Rare SHOB.  Walks 2 miles daily.  He plays golf.  No problems with that activity.  Has some pain in the chest wall when he swings to hard.   The patient does not have symptoms concerning for COVID-19 infection (fever, chills, cough, or new shortness of breath).    Past Medical History:  Diagnosis Date  . Abnormal chest CT   . Allergic rhinitis, unspecified   . Anemia   . Aortic atherosclerosis ([place])   . BPH (benign prostatic hyperplasia)   . COPD (chronic obstructive pulmonary disease) ([place])   . Coronary artery disease   . Cough variant asthma   . Diabetes mellitus without complication ([place])   . Hypertension   . Other chronic pain   . Pancreatic lesion   .  Pure hypercholesterolemia, unspecified    Past Surgical History:  Procedure Laterality Date  . BUNIONECTOMY    . CERVICAL DISC SURGERY    . KNEE ARTHROSCOPY    . LEG SURGERY Left    BENIGN BONE TUMOR  .  PROSTATE SURGERY    . ROTATOR CUFF REPAIR Bilateral      Current Meds  Medication Sig  . amLODipine (NORVASC) 5 MG tablet Take 5 mg by mouth daily.  . Calcium Carb-Cholecalciferol (CALCIUM + D3 PO) Take by mouth.  . calcium carbonate (OSCAL) 1500 (600 Ca) MG TABS tablet Take 600 mg of elemental calcium by mouth daily.  . ferrous sulfate 325 (65 FE) MG tablet Take 325 mg by mouth daily with breakfast.  . FREESTYLE LITE test strip   . Glucosamine-Chondroitin (GLUCOSAMINE CHONDR COMPLEX PO) Take by mouth 2 (two) times daily.   . hydrochlorothiazide (HYDRODIURIL) 12.5 MG tablet Take 1 tablet by mouth daily.  Marland Kitchen ibuprofen (ADVIL,MOTRIN) 200 MG tablet Take 200-800 mg by mouth every 6 (six) hours as needed for fever, headache, mild pain, moderate pain or cramping.   Marland Kitchen ketoconazole (NIZORAL) 2 % shampoo Apply topically as needed.  . loratadine (CLARITIN) 10 MG tablet Take 10 mg by mouth daily.  Marland Kitchen losartan-hydrochlorothiazide (HYZAAR) 50-12.5 MG per tablet Take 1 tablet by mouth daily.  . metFORMIN (GLUMETZA) 500 MG (MOD) 24 hr tablet Take 500 mg by mouth daily with breakfast.  . mometasone (ASMANEX, 60 METERED DOSES,) 220 MCG/INH inhaler Inhale 2 puffs into the lungs daily.  . Multiple Vitamin (MULTIVITAMIN WITH MINERALS) TABS tablet Take 1 tablet by mouth daily.  . rosuvastatin (CRESTOR) 5 MG tablet Take 5 mg by mouth daily.   . tamsulosin (FLOMAX) 0.4 MG CAPS capsule Take 1 capsule by mouth daily.     Allergies:   Ciprofloxacin, Duloxetine, and Morphine and related   Social History   Tobacco Use  . Smoking status: Former Smoker    Packs/day: 3.00    Years: 14.00    Pack years: 42.00    Types: Cigarettes    Quit date: 09/17/1971    Years since quitting: 47.7  . Smokeless tobacco: Never Used  Substance Use Topics  . Alcohol use: No  . Drug use: No     Family Hx: The patient's Family history is unknown by patient.  ROS:   Please see the history of present illness.    Relieved that  his MRI was benign; chronic cough- COPD related All other systems reviewed and are negative.   Prior CV studies:   The following studies were reviewed today:  Prior CT scan reviewed  Labs/Other Tests and Data Reviewed:    EKG:  No ECG reviewed.  Recent Labs: No results found for requested labs within last 8760 hours.   Recent Lipid Panel No results found for: CHOL, TRIG, HDL, CHOLHDL, LDLCALC, LDLDIRECT  Wt Readings from Last 3 Encounters:  05/22/19 168 lb (76.2 kg)  04/04/19 171 lb (77.6 kg)  02/20/19 169 lb 9.6 oz (76.9 kg)     Objective:    Vital Signs:  BP 126/72   Wt 168 lb (76.2 kg)   BMI 24.99 kg/m    VITAL SIGNS:  reviewed GEN:  no acute distress RESPIRATORY:  normal respiratory effort, symmetric expansion NEURO:  alert and oriented x 3, no obvious focal deficit PSYCH:  normal affect exam limited by video format  ASSESSMENT & PLAN:    1. Coronary calcification: Continue aggressive secondary prevention.  No indication  for ischemic testing at this time.  If sx change or needs for surgery change, will reconsider more imaging.  2. HTN: The current medical regimen is effective;  continue present plan and medications. 3. DM: Well controlled.   4. Hyperlipidemia: Continue Crestor.    COVID-19 Education: The signs and symptoms of COVID-19 were discussed with the patient and how to seek care for testing (follow up with PCP or arrange E-visit).  The importance of social distancing was discussed today.  Time:   Today, I have spent 15 minutes with the patient with telehealth technology discussing the above problems.     Medication Adjustments/Labs and Tests Ordered: Current medicines are reviewed at length with the patient today.  Concerns regarding medicines are outlined above.   Tests Ordered: No orders of the defined types were placed in this encounter.   Medication Changes: No orders of the defined types were placed in this encounter.   Follow Up:   Either In Person or Virtual prn 1 year  Signed, Larae Grooms, MD  05/22/2019 4:06 PM    [place]

## 2019-05-22 ENCOUNTER — Telehealth (INDEPENDENT_AMBULATORY_CARE_PROVIDER_SITE_OTHER): Payer: PPO | Admitting: Interventional Cardiology

## 2019-05-22 ENCOUNTER — Other Ambulatory Visit: Payer: Self-pay

## 2019-05-22 ENCOUNTER — Encounter: Payer: Self-pay | Admitting: Interventional Cardiology

## 2019-05-22 VITALS — BP 126/72 | HR 97 | Wt 168.0 lb

## 2019-05-22 DIAGNOSIS — I10 Essential (primary) hypertension: Secondary | ICD-10-CM

## 2019-05-22 DIAGNOSIS — I2510 Atherosclerotic heart disease of native coronary artery without angina pectoris: Secondary | ICD-10-CM | POA: Diagnosis not present

## 2019-05-22 DIAGNOSIS — E119 Type 2 diabetes mellitus without complications: Secondary | ICD-10-CM | POA: Diagnosis not present

## 2019-05-22 NOTE — Patient Instructions (Signed)
Medication Instructions:  Your physician recommends that you continue on your current medications as directed. Please refer to the Current Medication list given to you today.  *If you need a refill on your cardiac medications before your next appointment, please call your pharmacy*  Lab Work: None ordered  If you have labs (blood work) drawn today and your tests are completely normal, you will receive your results only by: Marland Kitchen MyChart Message (if you have MyChart) OR . A paper copy in the mail If you have any lab test that is abnormal or we need to change your treatment, we will call you to review the results.  Testing/Procedures: None ordered  Follow-Up: At [facility], you and your health needs are our priority.  As part of our continuing mission to provide you with exceptional heart care, we have created designated Provider Care Teams.  These Care Teams include your primary Cardiologist (physician) and Advanced Practice Providers (APPs -  Physician Assistants and Nurse Practitioners) who all work together to provide you with the care you need, when you need it.  Your next appointment:   12 months  The format for your next appointment:   Either In Person or Virtual  Provider:   Casandra Doffing, MD  Other Instructions

## 2019-05-30 DIAGNOSIS — N400 Enlarged prostate without lower urinary tract symptoms: Secondary | ICD-10-CM | POA: Diagnosis not present

## 2019-05-30 DIAGNOSIS — Z7984 Long term (current) use of oral hypoglycemic drugs: Secondary | ICD-10-CM | POA: Diagnosis not present

## 2019-05-30 DIAGNOSIS — E119 Type 2 diabetes mellitus without complications: Secondary | ICD-10-CM | POA: Diagnosis not present

## 2019-05-30 DIAGNOSIS — J45991 Cough variant asthma: Secondary | ICD-10-CM | POA: Diagnosis not present

## 2019-05-30 DIAGNOSIS — E7800 Pure hypercholesterolemia, unspecified: Secondary | ICD-10-CM | POA: Diagnosis not present

## 2019-05-30 DIAGNOSIS — I10 Essential (primary) hypertension: Secondary | ICD-10-CM | POA: Diagnosis not present

## 2019-05-30 DIAGNOSIS — J449 Chronic obstructive pulmonary disease, unspecified: Secondary | ICD-10-CM | POA: Diagnosis not present

## 2019-05-30 DIAGNOSIS — I2510 Atherosclerotic heart disease of native coronary artery without angina pectoris: Secondary | ICD-10-CM | POA: Diagnosis not present

## 2019-06-11 ENCOUNTER — Other Ambulatory Visit: Payer: Self-pay

## 2019-06-11 ENCOUNTER — Ambulatory Visit: Payer: PPO | Admitting: Podiatry

## 2019-06-11 ENCOUNTER — Encounter: Payer: Self-pay | Admitting: Podiatry

## 2019-06-11 DIAGNOSIS — Q828 Other specified congenital malformations of skin: Secondary | ICD-10-CM

## 2019-06-11 DIAGNOSIS — D539 Nutritional anemia, unspecified: Secondary | ICD-10-CM

## 2019-06-11 DIAGNOSIS — E0842 Diabetes mellitus due to underlying condition with diabetic polyneuropathy: Secondary | ICD-10-CM

## 2019-06-11 DIAGNOSIS — Z794 Long term (current) use of insulin: Secondary | ICD-10-CM | POA: Diagnosis not present

## 2019-06-11 DIAGNOSIS — D689 Coagulation defect, unspecified: Secondary | ICD-10-CM

## 2019-06-11 DIAGNOSIS — M79672 Pain in left foot: Secondary | ICD-10-CM

## 2019-06-11 DIAGNOSIS — M2062 Acquired deformities of toe(s), unspecified, left foot: Secondary | ICD-10-CM

## 2019-06-12 ENCOUNTER — Encounter: Payer: Self-pay | Admitting: Podiatry

## 2019-06-12 NOTE — Progress Notes (Addendum)
Subjective:  Patient ID: Isaac Weber, male    DOB: October 04, 1945,  MRN: [id number]  Chief Complaint  Patient presents with  . Callouses    Patient presents today for painful callous lesion bottom of left 1st mpj x 2-3 months    73 y.o. male presents with the above complaint.  Patient states this is plantar submet 1 and submet 3 callus have been very painful especially while he is ambulating.  Patient denies seeking any care.  Patient had a previous metatarsal phalangeal joint done by another podiatrist in our practice long time ago.  He states these calluses started at work couple of months.  He has tried hand cream and other foot lotions to help decrease the thickness of the callus.  However it comes back and has not resolved completely.  He denies any other acute complaints.   Review of Systems: Negative except as noted in the HPI. Denies N/V/F/Ch.  Past Medical History:  Diagnosis Date  . Abnormal chest CT   . Allergic rhinitis, unspecified   . Anemia   . Aortic atherosclerosis ([place])   . BPH (benign prostatic hyperplasia)   . COPD (chronic obstructive pulmonary disease) ([place])   . Coronary artery disease   . Cough variant asthma   . Diabetes mellitus without complication ([place])   . Hypertension   . Other chronic pain   . Pancreatic lesion   . Pure hypercholesterolemia, unspecified     Current Outpatient Medications:  .  amLODipine (NORVASC) 5 MG tablet, Take 5 mg by mouth daily., Disp: , Rfl:  .  Calcium Carb-Cholecalciferol (CALCIUM + D3 PO), Take by mouth., Disp: , Rfl:  .  calcium carbonate (OSCAL) 1500 (600 Ca) MG TABS tablet, Take 600 mg of elemental calcium by mouth daily., Disp: , Rfl:  .  ferrous sulfate 325 (65 FE) MG tablet, Take 325 mg by mouth daily with breakfast., Disp: , Rfl:  .  FREESTYLE LITE test strip, , Disp: , Rfl:  .  Glucosamine-Chondroitin (GLUCOSAMINE CHONDR COMPLEX PO), Take by mouth 2 (two) times daily. , Disp: , Rfl:  .  hydrochlorothiazide  (HYDRODIURIL) 12.5 MG tablet, Take 1 tablet by mouth daily., Disp: , Rfl:  .  ibuprofen (ADVIL,MOTRIN) 200 MG tablet, Take 200-800 mg by mouth every 6 (six) hours as needed for fever, headache, mild pain, moderate pain or cramping. , Disp: , Rfl:  .  ketoconazole (NIZORAL) 2 % shampoo, Apply topically as needed., Disp: , Rfl:  .  loratadine (CLARITIN) 10 MG tablet, Take 10 mg by mouth daily., Disp: , Rfl:  .  losartan-hydrochlorothiazide (HYZAAR) 50-12.5 MG per tablet, Take 1 tablet by mouth daily., Disp: , Rfl:  .  metFORMIN (GLUCOPHAGE-XR) 500 MG 24 hr tablet, Take 500 mg by mouth at bedtime., Disp: , Rfl:  .  metFORMIN (GLUMETZA) 500 MG (MOD) 24 hr tablet, Take 500 mg by mouth daily with breakfast., Disp: , Rfl:  .  Multiple Vitamin (MULTIVITAMIN WITH MINERALS) TABS tablet, Take 1 tablet by mouth daily., Disp: , Rfl:  .  rosuvastatin (CRESTOR) 5 MG tablet, Take 5 mg by mouth daily. , Disp: , Rfl:  .  tamsulosin (FLOMAX) 0.4 MG CAPS capsule, Take 1 capsule by mouth daily., Disp: , Rfl:   Social History   Tobacco Use  Smoking Status Former Smoker  . Packs/day: 3.00  . Years: 14.00  . Pack years: 42.00  . Types: Cigarettes  . Quit date: 09/17/1971  . Years since quitting: 47.7  Smokeless Tobacco  Never Used    Allergies  Allergen Reactions  . Ciprofloxacin Diarrhea and Nausea And Vomiting  . Duloxetine Other (See Comments)    Severe fatique and constipation.  . Morphine And Related Diarrhea and Nausea And Vomiting   Objective:  There were no vitals filed for this visit. There is no height or weight on file to calculate BMI. Constitutional Well developed. Well nourished.  Vascular Dorsalis pedis pulses palpable bilaterally. Posterior tibial pulses palpable bilaterally. Capillary refill normal to all digits.  No cyanosis or clubbing noted. Pedal hair growth normal.  Neurologic Normal speech. Oriented to person, place, and time. Epicritic sensation to light touch grossly present  bilaterally.  Dermatologic  hyperkeratotic lesion noted left submet 1 with a central core upon debridement.  Left l submet 3 hyperkeratotic lesion noted.  Pain on palpation to both of the lesions.  Orthopedic: Normal joint ROM without pain or crepitus bilaterally. No visible deformities. No bony tenderness.   Radiographs: None Assessment:   1. Coagulation defect ([place])   2. Deficiency anemia   3. Porokeratosis   4. Pain in left foot   5. Diabetes mellitus due to underlying condition, controlled, with diabetic polyneuropathy, with long-term current use of insulin ([place])    Plan:  Patient was evaluated and treated and all questions answered.  Left submet 1 and submet 3 porokeratosis -Using a chisel and a blade handle, the lesions were aggressively debrided down down to healthy striated tissue.  No complications noted.  No pinpoint bleeding noted. -I instructed and educated patient on porokeratosis and all the various treatment options available including multiple debridements over the period of time. -I believe patient will benefit from diabetic shoes as patient has many pressure sensitive areas.   Return if symptoms worsen or fail to improve.

## 2019-06-13 ENCOUNTER — Telehealth: Payer: Self-pay | Admitting: Podiatry

## 2019-06-13 NOTE — Telephone Encounter (Signed)
Pt sent email asking if he could  get diabetic shoes if the insurance still covers them yearly.  I reviewed chart and don't see in your note about being diabetic an additional diagnosis besides callus/corn to cover them. He has to have an additional foot condition. I notified pt I would discuss with you and let him know.

## 2019-06-13 NOTE — Telephone Encounter (Signed)
Thank you for catching that. I have addend my note to incorporate the DM and has shoes.   Thanks  Lennette Bihari

## 2019-06-18 NOTE — Telephone Encounter (Signed)
Pt returned call and is scheduled to see Rick on 12.2.2020 and is aware that he may not get the shoes for 2020 with it being this late in the year. He will need HTA auth

## 2019-06-18 NOTE — Telephone Encounter (Signed)
Pt left message asking for a return call about diabetic shoes.   I returned call and left message for pt to call to schedule an appt but it maybe to late in the for this year because of paperwork needed.

## 2019-06-27 ENCOUNTER — Other Ambulatory Visit: Payer: Self-pay

## 2019-06-27 ENCOUNTER — Ambulatory Visit: Payer: PPO | Admitting: Orthotics

## 2019-06-27 DIAGNOSIS — Q828 Other specified congenital malformations of skin: Secondary | ICD-10-CM

## 2019-06-27 DIAGNOSIS — Z794 Long term (current) use of insulin: Secondary | ICD-10-CM

## 2019-06-27 DIAGNOSIS — E0842 Diabetes mellitus due to underlying condition with diabetic polyneuropathy: Secondary | ICD-10-CM

## 2019-06-27 DIAGNOSIS — M2011 Hallux valgus (acquired), right foot: Secondary | ICD-10-CM

## 2019-06-27 DIAGNOSIS — D539 Nutritional anemia, unspecified: Secondary | ICD-10-CM

## 2019-06-27 DIAGNOSIS — M2062 Acquired deformities of toe(s), unspecified, left foot: Secondary | ICD-10-CM

## 2019-06-27 NOTE — Progress Notes (Signed)

## 2019-07-03 DIAGNOSIS — M9903 Segmental and somatic dysfunction of lumbar region: Secondary | ICD-10-CM | POA: Diagnosis not present

## 2019-07-03 DIAGNOSIS — M9905 Segmental and somatic dysfunction of pelvic region: Secondary | ICD-10-CM | POA: Diagnosis not present

## 2019-07-03 DIAGNOSIS — M5136 Other intervertebral disc degeneration, lumbar region: Secondary | ICD-10-CM | POA: Diagnosis not present

## 2019-07-03 DIAGNOSIS — M5432 Sciatica, left side: Secondary | ICD-10-CM | POA: Diagnosis not present

## 2019-07-06 DIAGNOSIS — M5432 Sciatica, left side: Secondary | ICD-10-CM | POA: Diagnosis not present

## 2019-07-06 DIAGNOSIS — M5136 Other intervertebral disc degeneration, lumbar region: Secondary | ICD-10-CM | POA: Diagnosis not present

## 2019-07-06 DIAGNOSIS — M9905 Segmental and somatic dysfunction of pelvic region: Secondary | ICD-10-CM | POA: Diagnosis not present

## 2019-07-06 DIAGNOSIS — M9903 Segmental and somatic dysfunction of lumbar region: Secondary | ICD-10-CM | POA: Diagnosis not present

## 2019-07-23 DIAGNOSIS — C4492 Squamous cell carcinoma of skin, unspecified: Secondary | ICD-10-CM

## 2019-07-23 DIAGNOSIS — C44729 Squamous cell carcinoma of skin of left lower limb, including hip: Secondary | ICD-10-CM | POA: Diagnosis not present

## 2019-07-23 DIAGNOSIS — L820 Inflamed seborrheic keratosis: Secondary | ICD-10-CM | POA: Diagnosis not present

## 2019-07-23 HISTORY — DX: Squamous cell carcinoma of skin, unspecified: C44.92

## 2019-07-25 DIAGNOSIS — I2510 Atherosclerotic heart disease of native coronary artery without angina pectoris: Secondary | ICD-10-CM | POA: Diagnosis not present

## 2019-07-25 DIAGNOSIS — E7800 Pure hypercholesterolemia, unspecified: Secondary | ICD-10-CM | POA: Diagnosis not present

## 2019-07-25 DIAGNOSIS — J449 Chronic obstructive pulmonary disease, unspecified: Secondary | ICD-10-CM | POA: Diagnosis not present

## 2019-07-25 DIAGNOSIS — N400 Enlarged prostate without lower urinary tract symptoms: Secondary | ICD-10-CM | POA: Diagnosis not present

## 2019-07-25 DIAGNOSIS — I10 Essential (primary) hypertension: Secondary | ICD-10-CM | POA: Diagnosis not present

## 2019-07-25 DIAGNOSIS — Z7984 Long term (current) use of oral hypoglycemic drugs: Secondary | ICD-10-CM | POA: Diagnosis not present

## 2019-07-25 DIAGNOSIS — J45991 Cough variant asthma: Secondary | ICD-10-CM | POA: Diagnosis not present

## 2019-07-25 DIAGNOSIS — E119 Type 2 diabetes mellitus without complications: Secondary | ICD-10-CM | POA: Diagnosis not present

## 2019-08-07 DIAGNOSIS — M9903 Segmental and somatic dysfunction of lumbar region: Secondary | ICD-10-CM | POA: Diagnosis not present

## 2019-08-07 DIAGNOSIS — M5136 Other intervertebral disc degeneration, lumbar region: Secondary | ICD-10-CM | POA: Diagnosis not present

## 2019-08-07 DIAGNOSIS — M5432 Sciatica, left side: Secondary | ICD-10-CM | POA: Diagnosis not present

## 2019-08-07 DIAGNOSIS — M9905 Segmental and somatic dysfunction of pelvic region: Secondary | ICD-10-CM | POA: Diagnosis not present

## 2019-08-08 ENCOUNTER — Other Ambulatory Visit: Payer: Self-pay

## 2019-08-08 ENCOUNTER — Ambulatory Visit: Payer: PPO | Admitting: Primary Care

## 2019-08-08 ENCOUNTER — Encounter: Payer: Self-pay | Admitting: Primary Care

## 2019-08-08 DIAGNOSIS — J45991 Cough variant asthma: Secondary | ICD-10-CM | POA: Diagnosis not present

## 2019-08-08 MED ORDER — SPIRIVA RESPIMAT 2.5 MCG/ACT IN AERS: 2.0000 | INHALATION_SPRAY | Freq: Every day | RESPIRATORY_TRACT | 0 refills | Status: DC

## 2019-08-08 NOTE — Patient Instructions (Addendum)
CT chest showed no evidence of interstitial lung disease, mild emphysema and diffuse bronchial wall thickening suggesting COPD  Recommendations: Stop Asmanex Start Spiriva respimat 2.18mcg- take two puff once daily in the morning   Orders: Needs spirometry with bronchodilator and FENO  Follow-up: 4 weeks with Dr. Chase Caller or NP    Chronic Obstructive Pulmonary Disease Chronic obstructive pulmonary disease (COPD) is a long-term (chronic) lung problem. When you have COPD, it is hard for air to get in and out of your lungs. Usually the condition gets worse over time, and your lungs will never return to normal. There are things you can do to keep yourself as healthy as possible.  Your doctor may treat your condition with: ? Medicines. ? Oxygen. ? Lung surgery.  Your doctor may also recommend: ? Rehabilitation. This includes steps to make your body work better. It may involve a team of specialists. ? Quitting smoking, if you smoke. ? Exercise and changes to your diet. ? Comfort measures (palliative care). Follow these instructions at home: Medicines  Take over-the-counter and prescription medicines only as told by your doctor.  Talk to your doctor before taking any cough or allergy medicines. You may need to avoid medicines that cause your lungs to be dry. Lifestyle  If you smoke, stop. Smoking makes the problem worse. If you need help quitting, ask your doctor.  Avoid being around things that make your breathing worse. This may include smoke, chemicals, and fumes.  Stay active, but remember to rest as well.  Learn and use tips on how to relax.  Make sure you get enough sleep. Most adults need at least 7 hours of sleep every night.  Eat healthy foods. Eat smaller meals more often. Rest before meals. Controlled breathing Learn and use tips on how to control your breathing as told by your doctor. Try:  Breathing in (inhaling) through your nose for 1 second. Then, pucker your  lips and breath out (exhale) through your lips for 2 seconds.  Putting one hand on your belly (abdomen). Breathe in slowly through your nose for 1 second. Your hand on your belly should move out. Pucker your lips and breathe out slowly through your lips. Your hand on your belly should move in as you breathe out.  Controlled coughing Learn and use controlled coughing to clear mucus from your lungs. Follow these steps: 1. Lean your head a little forward. 2. Breathe in deeply. 3. Try to hold your breath for 3 seconds. 4. Keep your mouth slightly open while coughing 2 times. 5. Spit any mucus out into a tissue. 6. Rest and do the steps again 1 or 2 times as needed. General instructions  Make sure you get all the shots (vaccines) that your doctor recommends. Ask your doctor about a flu shot and a pneumonia shot.  Use oxygen therapy and pulmonary rehabilitation if told by your doctor. If you need home oxygen therapy, ask your doctor if you should buy a tool to measure your oxygen level (oximeter).  Make a COPD action plan with your doctor. This helps you to know what to do if you feel worse than usual.  Manage any other conditions you have as told by your doctor.  Avoid going outside when it is very hot, cold, or humid.  Avoid people who have a sickness you can catch (contagious).  Keep all follow-up visits as told by your doctor. This is important. Contact a doctor if:  You cough up more mucus than usual.  There is a change in the color or thickness of the mucus.  It is harder to breathe than usual.  Your breathing is faster than usual.  You have trouble sleeping.  You need to use your medicines more often than usual.  You have trouble doing your normal activities such as getting dressed or walking around the house. Get help right away if:  You have shortness of breath while resting.  You have shortness of breath that stops you from: ? Being able to talk. ? Doing normal  activities.  Your chest hurts for longer than 5 minutes.  Your skin color is more blue than usual.  Your pulse oximeter shows that you have low oxygen for longer than 5 minutes.  You have a fever.  You feel too tired to breathe normally. Summary  Chronic obstructive pulmonary disease (COPD) is a long-term lung problem.  The way your lungs work will never return to normal. Usually the condition gets worse over time. There are things you can do to keep yourself as healthy as possible.  Take over-the-counter and prescription medicines only as told by your doctor.  If you smoke, stop. Smoking makes the problem worse. This information is not intended to replace advice given to you by your health care provider. Make sure you discuss any questions you have with your health care provider. Document Revised: 06/24/2017 Document Reviewed: 08/16/2016 Elsevier Patient Education  2020 [employer].

## 2019-08-08 NOTE — Progress Notes (Signed)
@Patient  ID: Isaac Weber, male    DOB: 28-Oct-1945, 74 y.o.   MRN: [id number]  Chief Complaint  Patient presents with  . Follow-up    f/u [place]/ cough/COPD    Referring provider: Leighton Ruff, MD  HPI: 74 year old male, former smoker quit 1973.  Past medical history significant for cough variant asthma.  Patient of Dr. Chase Caller last seen July 2020.  Eosinophils 300.  Cough improved on Asmanex. HRCT in August showed no evidence of interstitial lung disease, mild parous septal emphysema and diffuse bronchial wall thickening suggesting COPD. Patient presents today for follow-up visit. Reports that he still has chronic cough, mostly dry. No significant shortness of breath, chest tightness of wheezing. Due MRI of the pancrease in March.   Allergies  Allergen Reactions  . Ciprofloxacin Diarrhea and Nausea And Vomiting  . Duloxetine Other (See Comments)    Severe fatique and constipation.  . Morphine And Related Diarrhea and Nausea And Vomiting    Immunization History  Administered Date(s) Administered  . Fluad Quad(high Dose 65+) 05/13/2019  . Influenza, High Dose Seasonal PF 07/15/2017, 04/12/2018  . Zoster Recombinat (Shingrix) 08/17/2017    Past Medical History:  Diagnosis Date  . Abnormal chest CT   . Allergic rhinitis, unspecified   . Anemia   . Aortic atherosclerosis ([place])   . BPH (benign prostatic hyperplasia)   . COPD (chronic obstructive pulmonary disease) ([place])   . Coronary artery disease   . Cough variant asthma   . Diabetes mellitus without complication ([place])   . Hypertension   . Other chronic pain   . Pancreatic lesion   . Pure hypercholesterolemia, unspecified     Tobacco History: Social History   Tobacco Use  Smoking Status Former Smoker  . Packs/day: 3.00  . Years: 14.00  . Pack years: 42.00  . Types: Cigarettes  . Quit date: 09/17/1971  . Years since quitting: 47.9  Smokeless Tobacco Never Used   Counseling given: Not  Answered   Outpatient Medications Prior to Visit  Medication Sig Dispense Refill  . amLODipine (NORVASC) 5 MG tablet Take 5 mg by mouth daily.    . Calcium Carb-Cholecalciferol (CALCIUM + D3 PO) Take by mouth.    . calcium carbonate (OSCAL) 1500 (600 Ca) MG TABS tablet Take 600 mg of elemental calcium by mouth daily.    . ferrous sulfate 325 (65 FE) MG tablet Take 325 mg by mouth daily with breakfast.    . FREESTYLE LITE test strip     . Glucosamine-Chondroitin (GLUCOSAMINE CHONDR COMPLEX PO) Take by mouth 2 (two) times daily.     . hydrochlorothiazide (HYDRODIURIL) 12.5 MG tablet Take 1 tablet by mouth daily.    Marland Kitchen ibuprofen (ADVIL,MOTRIN) 200 MG tablet Take 200-800 mg by mouth every 6 (six) hours as needed for fever, headache, mild pain, moderate pain or cramping.     Marland Kitchen ketoconazole (NIZORAL) 2 % shampoo Apply topically as needed.    . loratadine (CLARITIN) 10 MG tablet Take 10 mg by mouth daily.    Marland Kitchen losartan-hydrochlorothiazide (HYZAAR) 50-12.5 MG per tablet Take 1 tablet by mouth daily.    . metFORMIN (GLUCOPHAGE-XR) 500 MG 24 hr tablet Take 500 mg by mouth at bedtime.    . metFORMIN (GLUMETZA) 500 MG (MOD) 24 hr tablet Take 500 mg by mouth daily with breakfast.    . Mometasone Furoate (ASMANEX HFA) 200 MCG/ACT AERO Inhale into the lungs.    . Multiple Vitamin (MULTIVITAMIN WITH MINERALS) TABS tablet Take 1  tablet by mouth daily.    . rosuvastatin (CRESTOR) 5 MG tablet Take 5 mg by mouth daily.     . tamsulosin (FLOMAX) 0.4 MG CAPS capsule Take 1 capsule by mouth daily.    . Lancets (FREESTYLE) lancets Use to test your blood sugar once a day     No facility-administered medications prior to visit.   Review of Systems  Review of Systems  Constitutional: Negative.   Respiratory: Positive for cough. Negative for chest tightness, shortness of breath and wheezing.   Cardiovascular: Negative.    Physical Exam  BP 120/76 (BP Location: Left Arm, Patient Position: Sitting, Cuff Size:  Normal)   Pulse 90   Temp 98.6 F (37 C) (Temporal)   Ht 5' 8.75" (1.746 m)   Wt 171 lb 9.6 oz (77.8 kg)   SpO2 96% Comment: room air  BMI 25.53 kg/m  Physical Exam Constitutional:      Appearance: Normal appearance.  HENT:     Head: Normocephalic and atraumatic.     Mouth/Throat:     Comments: Deferred d/t masking Cardiovascular:     Rate and Rhythm: Normal rate and regular rhythm.  Pulmonary:     Effort: Pulmonary effort is normal.     Breath sounds: Normal breath sounds. No wheezing or rhonchi.  Neurological:     General: No focal deficit present.     Mental Status: He is alert and oriented to person, place, and time. Mental status is at baseline.  Psychiatric:        Mood and Affect: Mood normal.        Behavior: Behavior normal.        Thought Content: Thought content normal.        Judgment: Judgment normal.      Lab Results:  CBC    Component Value Date/Time   WBC 8.6 03/09/2018 1836   RBC 4.75 03/09/2018 1836   HGB 13.9 03/09/2018 1845   HGB 11.6 (L) 09/16/2014 1354   HCT 41.0 03/09/2018 1845   HCT 33.6 (L) 09/16/2014 1354   PLT 202 03/09/2018 1836   PLT 232 09/16/2014 1354   MCV 88.8 03/09/2018 1836   MCV 81.4 09/16/2014 1354   MCH 30.9 03/09/2018 1836   MCHC 34.8 03/09/2018 1836   RDW 12.9 03/09/2018 1836   RDW 13.9 09/16/2014 1354   LYMPHSABS 1.8 03/09/2018 1836   LYMPHSABS 1.8 09/16/2014 1354   MONOABS 0.9 03/09/2018 1836   MONOABS 0.6 09/16/2014 1354   EOSABS 0.4 03/09/2018 1836   EOSABS 0.3 09/16/2014 1354   BASOSABS 0.1 03/09/2018 1836   BASOSABS 0.0 09/16/2014 1354    BMET    Component Value Date/Time   NA 138 03/09/2018 1845   NA 140 09/16/2014 1354   K 4.0 03/09/2018 1845   K 3.7 09/16/2014 1354   CL 98 03/09/2018 1845   CO2 28 03/09/2018 1836   CO2 26 09/16/2014 1354   GLUCOSE 188 (H) 03/09/2018 1845   GLUCOSE 153 (H) 09/16/2014 1354   BUN 18 03/09/2018 1845   BUN 16.7 09/16/2014 1354   CREATININE 0.70 03/09/2018 1845    CREATININE 0.9 09/16/2014 1354   CALCIUM 9.5 03/09/2018 1836   CALCIUM 9.4 09/16/2014 1354   GFRNONAA >60 03/09/2018 1836   GFRAA >60 03/09/2018 1836    BNP No results found for: BNP  ProBNP No results found for: PROBNP  Imaging: No results found.   Assessment & Plan:   Cough variant asthma - Continues to have  chronic dry cough  - Former smoker  - CT chest showed no evidence of interstitial lung disease, mild emphysema and diffuse bronchial wall thickening suggesting COPD  Recommendations: Stop Asmanex Start Spiriva respimat 2.68mcg- take two puff once daily in the morning  Needs Spirometry with BD challenge and [place], NP 08/17/2019

## 2019-08-10 ENCOUNTER — Telehealth: Payer: Self-pay | Admitting: Primary Care

## 2019-08-10 NOTE — Telephone Encounter (Signed)
Pt called back  He is unsure who called or why  He is driving right now  Will listen to the VM and call back  He does need appt for spiro with BD and feno in 4 wks- Lauren- can we go ahead and put this on the spiro schedule??  If so his appt with Eustaquio Maize will have to change bc it is scheduled on a Wed  Please advise thanks!

## 2019-08-13 DIAGNOSIS — E7800 Pure hypercholesterolemia, unspecified: Secondary | ICD-10-CM | POA: Diagnosis not present

## 2019-08-13 DIAGNOSIS — E119 Type 2 diabetes mellitus without complications: Secondary | ICD-10-CM | POA: Diagnosis not present

## 2019-08-13 DIAGNOSIS — J449 Chronic obstructive pulmonary disease, unspecified: Secondary | ICD-10-CM | POA: Diagnosis not present

## 2019-08-13 DIAGNOSIS — J45991 Cough variant asthma: Secondary | ICD-10-CM | POA: Diagnosis not present

## 2019-08-13 DIAGNOSIS — N400 Enlarged prostate without lower urinary tract symptoms: Secondary | ICD-10-CM | POA: Diagnosis not present

## 2019-08-13 DIAGNOSIS — I2510 Atherosclerotic heart disease of native coronary artery without angina pectoris: Secondary | ICD-10-CM | POA: Diagnosis not present

## 2019-08-13 DIAGNOSIS — I10 Essential (primary) hypertension: Secondary | ICD-10-CM | POA: Diagnosis not present

## 2019-08-17 DIAGNOSIS — J45991 Cough variant asthma: Secondary | ICD-10-CM | POA: Insufficient documentation

## 2019-08-17 NOTE — Assessment & Plan Note (Signed)
-   Continues to have chronic dry cough  - Former smoker  - CT chest showed no evidence of interstitial lung disease, mild emphysema and diffuse bronchial wall thickening suggesting COPD  Recommendations: Stop Asmanex Start Spiriva respimat 2.47mcg- take two puff once daily in the morning  Needs Spirometry with BD challenge and FENO

## 2019-08-17 NOTE — Telephone Encounter (Signed)
Spoke with pt and advised him that he needed to scheule a feno/spirometry and covid test before his appt with [name] on 09/05/2019. I made his covid test appt for 08/27/2019 at 315 pm and spriro/feno test for Thursday 08/30/2019 at 11:00 am. Nothing further is needed.

## 2019-08-27 ENCOUNTER — Other Ambulatory Visit (HOSPITAL_COMMUNITY)
Admission: RE | Admit: 2019-08-27 | Discharge: 2019-08-27 | Disposition: A | Discharge status: Home / Self Care | Payer: PPO | Source: Ambulatory Visit | Attending: Primary Care | Admitting: Primary Care

## 2019-08-27 DIAGNOSIS — Z01812 Encounter for preprocedural laboratory examination: Secondary | ICD-10-CM | POA: Diagnosis not present

## 2019-08-27 DIAGNOSIS — Z20822 Contact with and (suspected) exposure to covid-19: Secondary | ICD-10-CM | POA: Diagnosis not present

## 2019-08-27 NOTE — Telephone Encounter (Signed)
I would take an antihistamine such as Zyrtec if he isn't already, Flonase nasal spray once daily and delsym cough syrup twice daily. He can stop mucinex and spiriva. Resume asmanex if he has. I can also send in [place] for cough if he would like.

## 2019-08-27 NOTE — Telephone Encounter (Signed)
[name] please advise on below patient message:   Isaac Weber, I realize because of Covid your examination was not thorough. I  know that I will have covid test before my next visit I wanted to let you know, I don't believe the Spiriva Respimat  is doing any good I seem to be coughing more than ever. I'm thinking it could also be related to post nasal drip. I took a regimen of Mucinex DM for a week it didn't seem to help. I will have a breathing test before our next visit that will probably help in your diagnosis, at least I hope so. I'm not only driving my wife nuts I'm also driving myself nuts and according to my family it is a very short trip to drive myself nuts ( just a little humor]                                                                                                                               Thanks in advance. Esperanza Richters

## 2019-08-28 LAB — SARS CORONAVIRUS 2 (TAT 6-24 HRS): SARS Coronavirus 2: NEGATIVE

## 2019-08-30 ENCOUNTER — Other Ambulatory Visit: Payer: Self-pay

## 2019-08-30 ENCOUNTER — Ambulatory Visit (INDEPENDENT_AMBULATORY_CARE_PROVIDER_SITE_OTHER): Payer: PPO

## 2019-08-30 DIAGNOSIS — J45991 Cough variant asthma: Secondary | ICD-10-CM | POA: Diagnosis not present

## 2019-08-30 LAB — NITRIC OXIDE: Clinical Information: 29

## 2019-09-05 ENCOUNTER — Encounter: Payer: Self-pay | Admitting: Primary Care

## 2019-09-05 ENCOUNTER — Ambulatory Visit: Payer: PPO | Admitting: Primary Care

## 2019-09-05 ENCOUNTER — Other Ambulatory Visit: Payer: Self-pay

## 2019-09-05 DIAGNOSIS — J45991 Cough variant asthma: Secondary | ICD-10-CM | POA: Diagnosis not present

## 2019-09-05 MED ORDER — FLUTICASONE PROPIONATE 50 MCG/ACT NA SUSP: 1.0000 | Freq: Every day | NASAL | 4 refills | Status: DC

## 2019-09-05 MED ORDER — ASMANEX HFA 200 MCG/ACT IN AERO: 2.0000 | INHALATION_SPRAY | Freq: Two times a day (BID) | RESPIRATORY_TRACT | 5 refills | Status: DC | PRN

## 2019-09-05 NOTE — Assessment & Plan Note (Signed)
-   Spirometry showed no evidence of obstruction - FENO 29 (44) and Eos 400 elevated consistent with allergic asthma  - Use Asmanex 2 puffs every 12 hours as needed for asthmatic cough  - Continue Claritin and flonase nasal spray daily  - FU in 1 year as needed or is cough symptoms return/worsen

## 2019-09-05 NOTE — Progress Notes (Signed)
@Patient  ID: Isaac Weber, male    DOB: [birth year], 74 y.o.   MRN: [id number]  Chief Complaint  Patient presents with  . Follow-up    Cough variant asthma. Spirometry 08/30/19. Did not see a difference using Spiriva, would like to go back to Asmanex.    Referring provider: Leighton Ruff, MD  HPI: 74 year old male, former smoker quit 1973.  Past medical history significant for cough variant asthma.  Patient of Dr. Chase Caller last seen July 2020.  Eosinophils 300.  Cough improved on Asmanex. HRCT in August showed no evidence of interstitial lung disease, mild parous septal emphysema and diffuse bronchial wall thickening suggesting COPD. Due MRI of the pancrease in March. No PFTs on file. Ordered for spirometry and FENO. Trial Spiriva.   09/05/2019 Patient presents today for 4 week follow-up for chronic cough. Patient states that Spiriva made his cough worse. Recommended antihistamine daily, Flonase nasal spray, delsym cough syrup and tessalon perles prn cough. Ok to stop Spiriva and resume Asmanex. FENO was elevated at 29 and Spirometry showed mild restriction with no evidence of obstruction. He is feeling well today. Reports that his cough has dissipated. Very seldomly experiences wheezing. Occasional nocturnal cough when lying on left side. He has been taking Claritin and Flonase daily with improvement. He returned to using Asmanex but needs refill of medication. Denies shortness of breath.    Pulmonary testing: 08/30/19 Spirometry (post bronchodilator)- FVC 3.1 (75%), FEV1 2.5 (84%), ratio 81  08/30/19 FENO- 29 03/09/18 Eos- 400 (300) 07/08/17 FENO-44  Imaging: 03/13/19 HRCT- No evidence of interstitial lung disease. Mild paraseptal emphysema, mild diffuse bronchial wall thickening and mild saber sheath trachea configuration, suggesting COPD.Solitary left upper lobe 4 mm solid pulmonary nodule. Possible low-attenuation 1.5 cm pancreatic tail lesion,  Allergies  Allergen Reactions  .  Ciprofloxacin Diarrhea and Nausea And Vomiting  . Duloxetine Other (See Comments)    Severe fatique and constipation.  . Morphine And Related Diarrhea and Nausea And Vomiting    Immunization History  Administered Date(s) Administered  . Fluad Quad(high Dose 65+) 05/13/2019  . Influenza, High Dose Seasonal PF 07/15/2017, 04/12/2018  . Zoster Recombinat (Shingrix) 08/17/2017    Past Medical History:  Diagnosis Date  . Abnormal chest CT   . Allergic rhinitis, unspecified   . Anemia   . Aortic atherosclerosis ([place])   . BPH (benign prostatic hyperplasia)   . COPD (chronic obstructive pulmonary disease) ([place])   . Coronary artery disease   . Cough variant asthma   . Diabetes mellitus without complication ([place])   . Hypertension   . Other chronic pain   . Pancreatic lesion   . Pure hypercholesterolemia, unspecified     Tobacco History: Social History   Tobacco Use  Smoking Status Former Smoker  . Packs/day: 3.00  . Years: 14.00  . Pack years: 42.00  . Types: Cigarettes  . Quit date: 09/17/1971  . Years since quitting: 48.0  Smokeless Tobacco Never Used   Counseling given: Not Answered   Outpatient Medications Prior to Visit  Medication Sig Dispense Refill  . amLODipine (NORVASC) 5 MG tablet Take 5 mg by mouth daily.    . Calcium Carb-Cholecalciferol (CALCIUM + D3 PO) Take by mouth.    . calcium carbonate (OSCAL) 1500 (600 Ca) MG TABS tablet Take 600 mg of elemental calcium by mouth daily.    . ferrous sulfate 325 (65 FE) MG tablet Take 325 mg by mouth daily with breakfast.    . FREESTYLE LITE test  strip     . Glucosamine-Chondroitin (GLUCOSAMINE CHONDR COMPLEX PO) Take by mouth 2 (two) times daily.     . hydrochlorothiazide (HYDRODIURIL) 12.5 MG tablet Take 1 tablet by mouth daily.    Marland Kitchen ibuprofen (ADVIL,MOTRIN) 200 MG tablet Take 200-800 mg by mouth every 6 (six) hours as needed for fever, headache, mild pain, moderate pain or cramping.     Marland Kitchen ketoconazole (NIZORAL) 2 %  shampoo Apply topically as needed.    . Lancets (FREESTYLE) lancets Use to test your blood sugar once a day    . loratadine (CLARITIN) 10 MG tablet Take 10 mg by mouth daily.    Marland Kitchen losartan-hydrochlorothiazide (HYZAAR) 50-12.5 MG per tablet Take 1 tablet by mouth daily.    . metFORMIN (GLUCOPHAGE-XR) 500 MG 24 hr tablet Take 500 mg by mouth at bedtime.    . Multiple Vitamin (MULTIVITAMIN WITH MINERALS) TABS tablet Take 1 tablet by mouth daily.    . rosuvastatin (CRESTOR) 5 MG tablet Take 5 mg by mouth daily.     . tamsulosin (FLOMAX) 0.4 MG CAPS capsule Take 1 capsule by mouth daily.    . Mometasone Furoate (ASMANEX HFA) 200 MCG/ACT AERO Inhale into the lungs.    . Tiotropium Bromide Monohydrate (SPIRIVA RESPIMAT) 2.5 MCG/ACT AERS Inhale 2 puffs into the lungs daily. 8 g 0  . metFORMIN (GLUMETZA) 500 MG (MOD) 24 hr tablet Take 500 mg by mouth daily with breakfast.     No facility-administered medications prior to visit.   Review of Systems  Review of Systems  Constitutional: Negative.   Respiratory: Negative for cough, shortness of breath and wheezing.   Cardiovascular: Negative.    Physical Exam  BP 134/76 (BP Location: Right Arm, Patient Position: Sitting, Cuff Size: Normal)   Pulse 96   Temp 97.9 F (36.6 C)   Ht 5' 8.75" (1.746 m)   Wt 173 lb 6.4 oz (78.7 kg)   SpO2 97% Comment: on room air  BMI 25.79 kg/m  Physical Exam Constitutional:      General: He is not in acute distress.    Appearance: Normal appearance. He is not ill-appearing.  HENT:     Head: Normocephalic and atraumatic.     Right Ear: Tympanic membrane and ear canal normal. There is no impacted cerumen.     Left Ear: Tympanic membrane and ear canal normal. There is no impacted cerumen.     Mouth/Throat:     Mouth: Mucous membranes are moist.     Pharynx: Oropharynx is clear.  Cardiovascular:     Rate and Rhythm: Normal rate and regular rhythm.     Heart sounds: No murmur.  Pulmonary:     Effort:  Pulmonary effort is normal.     Breath sounds: Normal breath sounds. No wheezing or rhonchi.     Comments: CTA Musculoskeletal:        General: Normal range of motion.     Cervical back: Normal range of motion.  Skin:    General: Skin is warm and dry.     Comments: Left inner ankle wound healing CDI   Neurological:     General: No focal deficit present.     Mental Status: He is alert and oriented to person, place, and time. Mental status is at baseline.  Psychiatric:        Mood and Affect: Mood normal.        Behavior: Behavior normal.        Thought Content: Thought content normal.  Judgment: Judgment normal.      Lab Results:  CBC    Component Value Date/Time   WBC 8.6 03/09/2018 1836   RBC 4.75 03/09/2018 1836   HGB 13.9 03/09/2018 1845   HGB 11.6 (L) 09/16/2014 1354   HCT 41.0 03/09/2018 1845   HCT 33.6 (L) 09/16/2014 1354   PLT 202 03/09/2018 1836   PLT 232 09/16/2014 1354   MCV 88.8 03/09/2018 1836   MCV 81.4 09/16/2014 1354   MCH 30.9 03/09/2018 1836   MCHC 34.8 03/09/2018 1836   RDW 12.9 03/09/2018 1836   RDW 13.9 09/16/2014 1354   LYMPHSABS 1.8 03/09/2018 1836   LYMPHSABS 1.8 09/16/2014 1354   MONOABS 0.9 03/09/2018 1836   MONOABS 0.6 09/16/2014 1354   EOSABS 0.4 03/09/2018 1836   EOSABS 0.3 09/16/2014 1354   BASOSABS 0.1 03/09/2018 1836   BASOSABS 0.0 09/16/2014 1354    BMET    Component Value Date/Time   NA 138 03/09/2018 1845   NA 140 09/16/2014 1354   K 4.0 03/09/2018 1845   K 3.7 09/16/2014 1354   CL 98 03/09/2018 1845   CO2 28 03/09/2018 1836   CO2 26 09/16/2014 1354   GLUCOSE 188 (H) 03/09/2018 1845   GLUCOSE 153 (H) 09/16/2014 1354   BUN 18 03/09/2018 1845   BUN 16.7 09/16/2014 1354   CREATININE 0.70 03/09/2018 1845   CREATININE 0.9 09/16/2014 1354   CALCIUM 9.5 03/09/2018 1836   CALCIUM 9.4 09/16/2014 1354   GFRNONAA >60 03/09/2018 1836   GFRAA >60 03/09/2018 1836    BNP No results found for: BNP  ProBNP No results  found for: PROBNP  Imaging: No results found.   Assessment & Plan:   Cough variant asthma - Spirometry showed no evidence of obstruction - FENO 29 (44) and Eos 400 elevated consistent with allergic asthma  - Use Asmanex 2 puffs every 12 hours as needed for asthmatic cough  - Continue Claritin and flonase nasal spray daily  - FU in 1 year as needed or is cough symptoms return/worsen    Martyn Ehrich, NP 09/05/2019

## 2019-09-05 NOTE — Patient Instructions (Addendum)
Testing: - Spirometry showed no evidence of obstruction or COPD, mild restriction  - Elevated FENO and eosinophils 300-400 consistent with allergic/eosinophilic asthma   Recommendations: - Use Asmanex 2 puffs every 12 hours as needed for asthmatic cough  - Continue Claritin and flonase nasal spray daily   Follow-up: - 1 year with Dr. Chase Caller or earlier if needed    Asthma, Adult  Asthma is a long-term (chronic) condition in which the airways get tight and narrow. The airways are the breathing passages that lead from the nose and mouth down into the lungs. A person with asthma will have times when symptoms get worse. These are called asthma attacks. They can cause coughing, whistling sounds when you breathe (wheezing), shortness of breath, and chest pain. They can make it hard to breathe. There is no cure for asthma, but medicines and lifestyle changes can help control it. There are many things that can bring on an asthma attack or make asthma symptoms worse (triggers). Common triggers include:  Mold.  Dust.  Cigarette smoke.  Cockroaches.  Things that can cause allergy symptoms (allergens). These include animal skin flakes (dander) and pollen from trees or grass.  Things that pollute the air. These may include household cleaners, wood smoke, smog, or chemical odors.  Cold air, weather changes, and wind.  Crying or laughing hard.  Stress.  Certain medicines or drugs.  Certain foods such as dried fruit, potato chips, and grape juice.  Infections, such as a cold or the flu.  Certain medical conditions or diseases.  Exercise or tiring activities. Asthma may be treated with medicines and by staying away from the things that cause asthma attacks. Types of medicines may include:  Controller medicines. These help prevent asthma symptoms. They are usually taken every day.  Fast-acting reliever or rescue medicines. These quickly relieve asthma symptoms. They are used as needed  and provide short-term relief.  Allergy medicines if your attacks are brought on by allergens.  Medicines to help control the body's defense (immune) system. Follow these instructions at home: Avoiding triggers in your home  Change your heating and air conditioning filter often.  Limit your use of fireplaces and wood stoves.  Get rid of pests (such as roaches and mice) and their droppings.  Throw away plants if you see mold on them.  Clean your floors. Dust regularly. Use cleaning products that do not smell.  Have someone vacuum when you are not home. Use a vacuum cleaner with a HEPA filter if possible.  Replace carpet with wood, tile, or vinyl flooring. Carpet can trap animal skin flakes and dust.  Use allergy-proof pillows, mattress covers, and box spring covers.  Wash bed sheets and blankets every week in hot water. Dry them in a dryer.  Keep your bedroom free of any triggers.  Avoid pets and keep windows closed when things that cause allergy symptoms are in the air.  Use blankets that are made of polyester or cotton.  Clean bathrooms and kitchens with bleach. If possible, have someone repaint the walls in these rooms with mold-resistant paint. Keep out of the rooms that are being cleaned and painted.  Wash your hands often with soap and water. If soap and water are not available, use hand sanitizer.  Do not allow anyone to smoke in your home. General instructions  Take over-the-counter and prescription medicines only as told by your doctor. ? Talk with your doctor if you have questions about how or when to take your medicines. ?  Make note if you need to use your medicines more often than usual.  Do not use any products that contain nicotine or tobacco, such as cigarettes and e-cigarettes. If you need help quitting, ask your doctor.  Stay away from secondhand smoke.  Avoid doing things outdoors when allergen counts are high and when air quality is low.  Wear a ski  mask when doing outdoor activities in the winter. The mask should cover your nose and mouth. Exercise indoors on cold days if you can.  Warm up before you exercise. Take time to cool down after exercise.  Use a peak flow meter as told by your doctor. A peak flow meter is a tool that measures how well the lungs are working.  Keep track of the peak flow meter's readings. Write them down.  Follow your asthma action plan. This is a written plan for taking care of your asthma and treating your attacks.  Make sure you get all the shots (vaccines) that your doctor recommends. Ask your doctor about a flu shot and a pneumonia shot.  Keep all follow-up visits as told by your doctor. This is important. Contact a doctor if:  You have wheezing, shortness of breath, or a cough even while taking medicine to prevent attacks.  The mucus you cough up (sputum) is thicker than usual.  The mucus you cough up changes from clear or white to yellow, green, gray, or bloody.  You have problems from the medicine you are taking, such as: ? A rash. ? Itching. ? Swelling. ? Trouble breathing.  You need reliever medicines more than 2-3 times a week.  Your peak flow reading is still at 50-79% of your personal best after following the action plan for 1 hour.  You have a fever. Get help right away if:  You seem to be worse and are not responding to medicine during an asthma attack.  You are short of breath even at rest.  You get short of breath when doing very little activity.  You have trouble eating, drinking, or talking.  You have chest pain or tightness.  You have a fast heartbeat.  Your lips or fingernails start to turn blue.  You are light-headed or dizzy, or you faint.  Your peak flow is less than 50% of your personal best.  You feel too tired to breathe normally. Summary  Asthma is a long-term (chronic) condition in which the airways get tight and narrow. An asthma attack can make it  hard to breathe.  Asthma cannot be cured, but medicines and lifestyle changes can help control it.  Make sure you understand how to avoid triggers and how and when to use your medicines. This information is not intended to replace advice given to you by your health care provider. Make sure you discuss any questions you have with your health care provider. Document Revised: 09/14/2018 Document Reviewed: 08/16/2016 Elsevier Patient Education  2020 [employer].

## 2019-09-11 DIAGNOSIS — J45991 Cough variant asthma: Secondary | ICD-10-CM | POA: Diagnosis not present

## 2019-09-11 DIAGNOSIS — J449 Chronic obstructive pulmonary disease, unspecified: Secondary | ICD-10-CM | POA: Diagnosis not present

## 2019-09-11 DIAGNOSIS — E7800 Pure hypercholesterolemia, unspecified: Secondary | ICD-10-CM | POA: Diagnosis not present

## 2019-09-11 DIAGNOSIS — E119 Type 2 diabetes mellitus without complications: Secondary | ICD-10-CM | POA: Diagnosis not present

## 2019-09-11 DIAGNOSIS — I10 Essential (primary) hypertension: Secondary | ICD-10-CM | POA: Diagnosis not present

## 2019-09-11 DIAGNOSIS — N400 Enlarged prostate without lower urinary tract symptoms: Secondary | ICD-10-CM | POA: Diagnosis not present

## 2019-09-11 DIAGNOSIS — I2510 Atherosclerotic heart disease of native coronary artery without angina pectoris: Secondary | ICD-10-CM | POA: Diagnosis not present

## 2019-09-12 ENCOUNTER — Ambulatory Visit: Payer: PPO | Admitting: Orthotics

## 2019-09-12 ENCOUNTER — Other Ambulatory Visit: Payer: Self-pay

## 2019-09-12 DIAGNOSIS — M2062 Acquired deformities of toe(s), unspecified, left foot: Secondary | ICD-10-CM | POA: Diagnosis not present

## 2019-09-12 DIAGNOSIS — E119 Type 2 diabetes mellitus without complications: Secondary | ICD-10-CM | POA: Diagnosis not present

## 2019-09-12 DIAGNOSIS — M2011 Hallux valgus (acquired), right foot: Secondary | ICD-10-CM | POA: Diagnosis not present

## 2019-10-01 DIAGNOSIS — M5136 Other intervertebral disc degeneration, lumbar region: Secondary | ICD-10-CM | POA: Diagnosis not present

## 2019-10-01 DIAGNOSIS — N401 Enlarged prostate with lower urinary tract symptoms: Secondary | ICD-10-CM | POA: Diagnosis not present

## 2019-10-01 DIAGNOSIS — M5432 Sciatica, left side: Secondary | ICD-10-CM | POA: Diagnosis not present

## 2019-10-01 DIAGNOSIS — M9903 Segmental and somatic dysfunction of lumbar region: Secondary | ICD-10-CM | POA: Diagnosis not present

## 2019-10-01 DIAGNOSIS — M9905 Segmental and somatic dysfunction of pelvic region: Secondary | ICD-10-CM | POA: Diagnosis not present

## 2019-10-08 DIAGNOSIS — M9903 Segmental and somatic dysfunction of lumbar region: Secondary | ICD-10-CM | POA: Diagnosis not present

## 2019-10-08 DIAGNOSIS — M9905 Segmental and somatic dysfunction of pelvic region: Secondary | ICD-10-CM | POA: Diagnosis not present

## 2019-10-08 DIAGNOSIS — R3914 Feeling of incomplete bladder emptying: Secondary | ICD-10-CM | POA: Diagnosis not present

## 2019-10-08 DIAGNOSIS — N401 Enlarged prostate with lower urinary tract symptoms: Secondary | ICD-10-CM | POA: Diagnosis not present

## 2019-10-08 DIAGNOSIS — M5136 Other intervertebral disc degeneration, lumbar region: Secondary | ICD-10-CM | POA: Diagnosis not present

## 2019-10-08 DIAGNOSIS — M5432 Sciatica, left side: Secondary | ICD-10-CM | POA: Diagnosis not present

## 2019-10-10 DIAGNOSIS — M9903 Segmental and somatic dysfunction of lumbar region: Secondary | ICD-10-CM | POA: Diagnosis not present

## 2019-10-10 DIAGNOSIS — M5432 Sciatica, left side: Secondary | ICD-10-CM | POA: Diagnosis not present

## 2019-10-10 DIAGNOSIS — M9905 Segmental and somatic dysfunction of pelvic region: Secondary | ICD-10-CM | POA: Diagnosis not present

## 2019-10-10 DIAGNOSIS — M5136 Other intervertebral disc degeneration, lumbar region: Secondary | ICD-10-CM | POA: Diagnosis not present

## 2019-10-11 DIAGNOSIS — I2510 Atherosclerotic heart disease of native coronary artery without angina pectoris: Secondary | ICD-10-CM | POA: Diagnosis not present

## 2019-10-11 DIAGNOSIS — N400 Enlarged prostate without lower urinary tract symptoms: Secondary | ICD-10-CM | POA: Diagnosis not present

## 2019-10-11 DIAGNOSIS — J45991 Cough variant asthma: Secondary | ICD-10-CM | POA: Diagnosis not present

## 2019-10-11 DIAGNOSIS — I10 Essential (primary) hypertension: Secondary | ICD-10-CM | POA: Diagnosis not present

## 2019-10-11 DIAGNOSIS — E7800 Pure hypercholesterolemia, unspecified: Secondary | ICD-10-CM | POA: Diagnosis not present

## 2019-10-11 DIAGNOSIS — J449 Chronic obstructive pulmonary disease, unspecified: Secondary | ICD-10-CM | POA: Diagnosis not present

## 2019-10-11 DIAGNOSIS — E119 Type 2 diabetes mellitus without complications: Secondary | ICD-10-CM | POA: Diagnosis not present

## 2019-10-15 DIAGNOSIS — M5432 Sciatica, left side: Secondary | ICD-10-CM | POA: Diagnosis not present

## 2019-10-15 DIAGNOSIS — M5136 Other intervertebral disc degeneration, lumbar region: Secondary | ICD-10-CM | POA: Diagnosis not present

## 2019-10-15 DIAGNOSIS — M9903 Segmental and somatic dysfunction of lumbar region: Secondary | ICD-10-CM | POA: Diagnosis not present

## 2019-10-15 DIAGNOSIS — M9905 Segmental and somatic dysfunction of pelvic region: Secondary | ICD-10-CM | POA: Diagnosis not present

## 2019-10-18 ENCOUNTER — Other Ambulatory Visit: Payer: Self-pay | Admitting: Family Medicine

## 2019-10-18 DIAGNOSIS — R935 Abnormal findings on diagnostic imaging of other abdominal regions, including retroperitoneum: Secondary | ICD-10-CM

## 2019-10-22 ENCOUNTER — Ambulatory Visit: Payer: PPO | Admitting: Dermatology

## 2019-10-22 ENCOUNTER — Telehealth: Payer: Self-pay | Admitting: Podiatry

## 2019-10-22 ENCOUNTER — Other Ambulatory Visit: Payer: Self-pay

## 2019-10-22 DIAGNOSIS — L219 Seborrheic dermatitis, unspecified: Secondary | ICD-10-CM

## 2019-10-22 DIAGNOSIS — D225 Melanocytic nevi of trunk: Secondary | ICD-10-CM | POA: Diagnosis not present

## 2019-10-22 DIAGNOSIS — D1800 Hemangioma unspecified site: Secondary | ICD-10-CM

## 2019-10-22 DIAGNOSIS — L821 Other seborrheic keratosis: Secondary | ICD-10-CM

## 2019-10-22 DIAGNOSIS — Z1283 Encounter for screening for malignant neoplasm of skin: Secondary | ICD-10-CM

## 2019-10-22 DIAGNOSIS — D2230 Melanocytic nevi of unspecified part of face: Secondary | ICD-10-CM | POA: Diagnosis not present

## 2019-10-22 DIAGNOSIS — D229 Melanocytic nevi, unspecified: Secondary | ICD-10-CM

## 2019-10-22 DIAGNOSIS — L578 Other skin changes due to chronic exposure to nonionizing radiation: Secondary | ICD-10-CM | POA: Diagnosis not present

## 2019-10-22 DIAGNOSIS — Z85828 Personal history of other malignant neoplasm of skin: Secondary | ICD-10-CM | POA: Diagnosis not present

## 2019-10-22 DIAGNOSIS — L570 Actinic keratosis: Secondary | ICD-10-CM

## 2019-10-22 DIAGNOSIS — L820 Inflamed seborrheic keratosis: Secondary | ICD-10-CM

## 2019-10-22 DIAGNOSIS — L814 Other melanin hyperpigmentation: Secondary | ICD-10-CM | POA: Diagnosis not present

## 2019-10-22 MED ORDER — KETOCONAZOLE 2 % EX SHAM: MEDICATED_SHAMPOO | CUTANEOUS | 6 refills | Status: DC

## 2019-10-22 NOTE — Progress Notes (Signed)
Follow-Up Visit   Subjective  Isaac Weber is a 74 y.o. male who presents for the following: Annual Exam (patient has a history of SCC on the L leg ) and seborrheic dermatitis (of the scalp, patient would like a refill on his Ketoconazole 2% shampoo).  The following portions of the chart were reviewed this encounter and updated as appropriate:     Review of Systems: No other skin or systemic complaints.  Objective  Well appearing patient in no apparent distress; mood and affect are within normal limits.  A full examination was performed including scalp, head, eyes, ears, nose, lips, neck, chest, axillae, abdomen, back, buttocks, bilateral upper extremities, bilateral lower extremities, hands, feet, fingers, toes, fingernails, and toenails. All findings within normal limits unless otherwise noted below.  Objective  Arms, hands, face, and ears (35): Erythematous thin papules/macules with gritty scale.   Objective  Face, trunk, extremities: Diffuse scaly erythematous macules with underlying dyspigmentation.   Objective  Trunk, extremities: Red papules.   Objective  L medial lower leg above the med ankle: Well healed scar with no evidence of recurrence, no lymphadenopathy.   Objective  B/L arm and back (17): Erythematous keratotic or waxy stuck-on papule or plaque.   Objective  Face, trunk, extremities: Scattered tan macules.   Objective  Face, trunk, extremities: Tan-brown and/or pink-flesh-colored symmetric macules and papules.   Objective  Scalp: Clear  Objective  Face, trunk, extremities: Stuck-on, waxy, tan-brown papule or plaque --Discussed benign etiology and prognosis.   Assessment & Plan  AK (actinic keratosis) (35) Arms, hands, face, and ears  Destruction of lesion - Arms, hands, face, and ears Complexity: simple   Destruction method: cryotherapy   Informed consent: discussed and consent obtained   Timeout:  patient name, date of birth,  surgical site, and procedure verified Lesion destroyed using liquid nitrogen: Yes   Region frozen until ice ball extended beyond lesion: Yes   Outcome: patient tolerated procedure well with no complications   Post-procedure details: wound care instructions given    Actinic skin damage Face, trunk, extremities  Benign-appearing.  Observation.  Call clinic for new or changing moles.  Recommend daily use of broad spectrum spf 30+ sunscreen to sun-exposed areas.    Hemangioma, unspecified site Trunk, extremities  Benign, observe.    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 Recommend daily broad spectrum sunscreen SPF 30+ to sun-exposed areas, reapply every 2 hours as needed. Call for new or changing lesions.   Inflamed seborrheic keratosis (17) B/L arm and back  Destruction of lesion - B/L arm and back Complexity: simple   Destruction method: cryotherapy   Informed consent: discussed and consent obtained   Timeout:  patient name, date of birth, surgical site, and procedure verified Lesion destroyed using liquid nitrogen: Yes   Region frozen until ice ball extended beyond lesion: Yes   Outcome: patient tolerated procedure well with no complications   Post-procedure details: wound care instructions given    Lentigines Face, trunk, extremities  Benign, observe.    Nevus Face, trunk, extremities  Benign, observe.    Seborrheic dermatitis Scalp  Continue Ketoconazole 2% shampoo 3d/wk let sit 10 minutes before washing out.  ketoconazole (NIZORAL) 2 % shampoo - Scalp  Seborrheic keratosis Face, trunk, extremities  Benign, observe.    Return in about 4 months (around 02/21/2020) for F/U appt. recheck AK's .   Tanya Nones,  CMA, am acting as scribe for Sarina Ser, MD .

## 2019-10-22 NOTE — Telephone Encounter (Signed)
Pt called and left message about Diabetic shoes being denied because no Josem Kaufmann was gotten.Marland Kitchen  Upon looking Josem Kaufmann was gotten for custom diabetic shoes(A5501)which are more expensive. I have refaxed a retro auth for off the shelf diabetic shoes (A5500) which is what he received . I have notified pt of this.Marland Kitchen

## 2019-11-12 DIAGNOSIS — M2062 Acquired deformities of toe(s), unspecified, left foot: Secondary | ICD-10-CM | POA: Diagnosis not present

## 2019-11-12 DIAGNOSIS — E0842 Diabetes mellitus due to underlying condition with diabetic polyneuropathy: Secondary | ICD-10-CM | POA: Diagnosis not present

## 2019-11-12 DIAGNOSIS — E7800 Pure hypercholesterolemia, unspecified: Secondary | ICD-10-CM | POA: Diagnosis not present

## 2019-11-12 DIAGNOSIS — N400 Enlarged prostate without lower urinary tract symptoms: Secondary | ICD-10-CM | POA: Diagnosis not present

## 2019-11-12 DIAGNOSIS — Z711 Person with feared health complaint in whom no diagnosis is made: Secondary | ICD-10-CM | POA: Diagnosis not present

## 2019-11-12 DIAGNOSIS — E119 Type 2 diabetes mellitus without complications: Secondary | ICD-10-CM | POA: Diagnosis not present

## 2019-11-12 DIAGNOSIS — M2550 Pain in unspecified joint: Secondary | ICD-10-CM | POA: Diagnosis not present

## 2019-11-12 DIAGNOSIS — I10 Essential (primary) hypertension: Secondary | ICD-10-CM | POA: Diagnosis not present

## 2019-11-12 DIAGNOSIS — J45991 Cough variant asthma: Secondary | ICD-10-CM | POA: Diagnosis not present

## 2019-11-13 ENCOUNTER — Ambulatory Visit
Admission: RE | Admit: 2019-11-13 | Discharge: 2019-11-13 | Disposition: A | Discharge status: Home / Self Care | Payer: PPO | Source: Ambulatory Visit | Attending: Family Medicine | Admitting: Family Medicine

## 2019-11-13 ENCOUNTER — Other Ambulatory Visit: Payer: Self-pay

## 2019-11-13 DIAGNOSIS — I10 Essential (primary) hypertension: Secondary | ICD-10-CM | POA: Diagnosis not present

## 2019-11-13 DIAGNOSIS — J449 Chronic obstructive pulmonary disease, unspecified: Secondary | ICD-10-CM | POA: Diagnosis not present

## 2019-11-13 DIAGNOSIS — E7800 Pure hypercholesterolemia, unspecified: Secondary | ICD-10-CM | POA: Diagnosis not present

## 2019-11-13 DIAGNOSIS — I2510 Atherosclerotic heart disease of native coronary artery without angina pectoris: Secondary | ICD-10-CM | POA: Diagnosis not present

## 2019-11-13 DIAGNOSIS — N400 Enlarged prostate without lower urinary tract symptoms: Secondary | ICD-10-CM | POA: Diagnosis not present

## 2019-11-13 DIAGNOSIS — R935 Abnormal findings on diagnostic imaging of other abdominal regions, including retroperitoneum: Secondary | ICD-10-CM

## 2019-11-13 DIAGNOSIS — J45991 Cough variant asthma: Secondary | ICD-10-CM | POA: Diagnosis not present

## 2019-11-13 DIAGNOSIS — K862 Cyst of pancreas: Secondary | ICD-10-CM | POA: Diagnosis not present

## 2019-11-13 DIAGNOSIS — E119 Type 2 diabetes mellitus without complications: Secondary | ICD-10-CM | POA: Diagnosis not present

## 2019-11-13 MED ORDER — GADOBENATE DIMEGLUMINE 529 MG/ML IV SOLN
15.0000 mL | Freq: Once | INTRAVENOUS | Status: AC | PRN
  Administered 2019-11-13: 15 mL via INTRAVENOUS

## 2019-11-16 DIAGNOSIS — I10 Essential (primary) hypertension: Secondary | ICD-10-CM | POA: Diagnosis not present

## 2019-11-16 DIAGNOSIS — E7800 Pure hypercholesterolemia, unspecified: Secondary | ICD-10-CM | POA: Diagnosis not present

## 2019-11-16 DIAGNOSIS — E119 Type 2 diabetes mellitus without complications: Secondary | ICD-10-CM | POA: Diagnosis not present

## 2019-11-16 DIAGNOSIS — Z7984 Long term (current) use of oral hypoglycemic drugs: Secondary | ICD-10-CM | POA: Diagnosis not present

## 2019-11-16 DIAGNOSIS — J449 Chronic obstructive pulmonary disease, unspecified: Secondary | ICD-10-CM | POA: Diagnosis not present

## 2019-11-16 DIAGNOSIS — I700 Atherosclerosis of aorta: Secondary | ICD-10-CM | POA: Diagnosis not present

## 2019-11-16 DIAGNOSIS — J45991 Cough variant asthma: Secondary | ICD-10-CM | POA: Diagnosis not present

## 2019-11-16 DIAGNOSIS — Z0000 Encounter for general adult medical examination without abnormal findings: Secondary | ICD-10-CM | POA: Diagnosis not present

## 2019-11-16 DIAGNOSIS — K869 Disease of pancreas, unspecified: Secondary | ICD-10-CM | POA: Diagnosis not present

## 2019-11-30 ENCOUNTER — Other Ambulatory Visit: Payer: Self-pay | Admitting: Dermatology

## 2020-01-11 ENCOUNTER — Encounter: Payer: Self-pay | Admitting: Family Medicine

## 2020-01-11 ENCOUNTER — Other Ambulatory Visit: Payer: Self-pay | Admitting: Family Medicine

## 2020-01-11 ENCOUNTER — Ambulatory Visit (INDEPENDENT_AMBULATORY_CARE_PROVIDER_SITE_OTHER): Payer: PPO | Admitting: Family Medicine

## 2020-01-11 ENCOUNTER — Other Ambulatory Visit: Payer: Self-pay

## 2020-01-11 VITALS — BP 134/60 | HR 85 | Temp 97.8°F | Resp 16 | Ht 68.0 in | Wt 171.8 lb

## 2020-01-11 DIAGNOSIS — Z7689 Persons encountering health services in other specified circumstances: Secondary | ICD-10-CM

## 2020-01-11 DIAGNOSIS — M8949 Other hypertrophic osteoarthropathy, multiple sites: Secondary | ICD-10-CM

## 2020-01-11 DIAGNOSIS — I10 Essential (primary) hypertension: Secondary | ICD-10-CM | POA: Diagnosis not present

## 2020-01-11 DIAGNOSIS — E1169 Type 2 diabetes mellitus with other specified complication: Secondary | ICD-10-CM | POA: Diagnosis not present

## 2020-01-11 DIAGNOSIS — Z0000 Encounter for general adult medical examination without abnormal findings: Secondary | ICD-10-CM

## 2020-01-11 DIAGNOSIS — N401 Enlarged prostate with lower urinary tract symptoms: Secondary | ICD-10-CM | POA: Insufficient documentation

## 2020-01-11 DIAGNOSIS — R351 Nocturia: Secondary | ICD-10-CM | POA: Diagnosis not present

## 2020-01-11 DIAGNOSIS — D509 Iron deficiency anemia, unspecified: Secondary | ICD-10-CM | POA: Insufficient documentation

## 2020-01-11 DIAGNOSIS — N281 Cyst of kidney, acquired: Secondary | ICD-10-CM

## 2020-01-11 DIAGNOSIS — K862 Cyst of pancreas: Secondary | ICD-10-CM

## 2020-01-11 DIAGNOSIS — E785 Hyperlipidemia, unspecified: Secondary | ICD-10-CM | POA: Diagnosis not present

## 2020-01-11 DIAGNOSIS — M159 Polyosteoarthritis, unspecified: Secondary | ICD-10-CM | POA: Insufficient documentation

## 2020-01-11 DIAGNOSIS — Z125 Encounter for screening for malignant neoplasm of prostate: Secondary | ICD-10-CM

## 2020-01-11 NOTE — Assessment & Plan Note (Signed)
Chronic problem, multiple joints Controlled on various therapy including topical nsaid, oral nsaid Trial on Relief Factor

## 2020-01-11 NOTE — Assessment & Plan Note (Addendum)
Mildly elevated initial BP, repeat manual check improved. - Home BP readings reviewed, normal at home, elevated here in office usually  No known complications     Plan:  1. Continue current BP regimen - Losartan-HCTZ 50-12.5mg  daily, Amlodipine 5mg  daily 2. Encourage improved lifestyle - low sodium diet, regular exercise 3. Continue monitor BP outside office, bring readings to next visit, if persistently >140/90 or new symptoms notify office sooner

## 2020-01-11 NOTE — Progress Notes (Signed)
Subjective:    Patient ID: Isaac Weber, male    DOB: 07-10-1946, 74 y.o.   MRN: [id number]  Isaac Weber is a 74 y.o. male presenting on 01/11/2020 for Establish Care (DM has some question about medication about arthritis that he wants to take), Diabetes, and Hypertension  Here to establish care as new patient. Previous PCP Dr Leighton Ruff in [place]  HPI   CHRONIC DM, Type 2 Hyperlipidemia Reports no concerns, has been controlled Meds: Metformin XR 500mg  daily at bedtime Reports good compliance. Tolerating well w/o side-effects Currently on ARB, Statin Denies hypoglycemia, polyuria, visual changes, numbness or tingling.  Cysts, multiple internal  History of multiple internal cysts including liver, kidney etc Previous PCP managing this problem with surveillance Pancreatic cysts, previous MRI q 6 month per Dr Drema Dallas Will be due for MRI follow-up in 04/2020 Request records.  CHRONIC HTN: Reports normally avg 120s, had higher reading here. He gets BP checked every 8 weeks with donating blood. Current Meds - Losartan-HCTZ 50-12.5mg  daily, Amlodipine 5mg  daily Reports good compliance, took meds today. Tolerating well, w/o complaints. Denies CP, dyspnea, HA, edema, dizziness / lightheadedness  History of Iron Deficiency Donates blood every 8 weeks. He takes iron supplement.  Osteoarthritis, bilateral knees, lumbar spine Chronic problem, multiple joints, episodic pain and flares He has has seen chiropractor as well. Plans to try Relief Factor for joints, goal to limit Motrin - Uses Voltaren topical PRN with good results - Not effective Tylenol - Motrin 200mg  x 4 night before occasional He is able to play golf weekly and is very active  BPH LUTS s/p procedure, laser 2010 improvement - he has some BPH LUTS, some frequency, some urgency, nocturia 3 x nightly - taking Tamsulosin 0.4mg  daily, and may try 2 eventually - Previous Therapist, music, now no  longer goes, will have DRE regularly and PSA  Health Maintenance:  COVID19 vaccine - unsure about taking it. Asking about Hydroxychloroquine He has elevated Vitamin D He is going to take Zinc supplement. And switch calcium.  History of tetanus, he was completely paralyzed for 3 weeks, was going to morgue, toe twitched, he had injury with a dirty stick that caused infection.   Depression screen PHQ 2/9 01/11/2020  Decreased Interest 0  Down, Depressed, Hopeless 0  PHQ - 2 Score 0    Past Medical History:  Diagnosis Date  . Abnormal chest CT   . Allergic rhinitis, unspecified   . Anemia   . Aortic atherosclerosis ([place])   . BPH (benign prostatic hyperplasia)   . COPD (chronic obstructive pulmonary disease) ([place])   . Coronary artery disease   . Cough variant asthma   . Glaucoma   . Hypertension   . Other chronic pain   . Pancreatic lesion   . Pure hypercholesterolemia, unspecified   . 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 Tx: [facility]   Past Surgical History:  Procedure Laterality Date  . BUNIONECTOMY    . CERVICAL DISC SURGERY    . KNEE ARTHROSCOPY    . LEG SURGERY Left    BENIGN BONE TUMOR  . PROSTATE SURGERY    . ROTATOR CUFF REPAIR Bilateral    Social History   Socioeconomic History  . Marital status: Married    Spouse name: Not on file  . Number of children: Not on file  . Years of education: Not on file  . Highest education level: Not  on file  Occupational History  . Not on file  Tobacco Use  . Smoking status: Former Smoker    Packs/day: 3.00    Years: 14.00    Pack years: 42.00    Types: Cigarettes    Quit date: 09/17/1971    Years since quitting: 48.3  . Smokeless tobacco: Never Used  Vaping Use  . Vaping Use: Never used  Substance and Sexual Activity  . Alcohol use: Yes  . Drug use: No  . Sexual activity: Not on file  Other Topics Concern  . Not on file  Social History Narrative  .  Not on file   Social Determinants of Health   Financial Resource Strain:   . Difficulty of Paying Living Expenses:   Food Insecurity:   . Worried About Charity fundraiser in the Last Year:   . Arboriculturist in the Last Year:   Transportation Needs:   . Film/video editor (Medical):   Marland Kitchen Lack of Transportation (Non-Medical):   Physical Activity:   . Days of Exercise per Week:   . Minutes of Exercise per Session:   Stress:   . Feeling of Stress :   Social Connections:   . Frequency of Communication with Friends and Family:   . Frequency of Social Gatherings with Friends and Family:   . Attends Religious Services:   . Active Member of Clubs or Organizations:   . Attends Archivist Meetings:   Marland Kitchen Marital Status:   Intimate Partner Violence:   . Fear of Current or Ex-Partner:   . Emotionally Abused:   Marland Kitchen Physically Abused:   . Sexually Abused:    Family History  Problem Relation Age of Onset  . Heart disease Mother   . Emphysema Father    Current Outpatient Medications on File Prior to Visit  Medication Sig  . amLODipine (NORVASC) 5 MG tablet Take 5 mg by mouth daily.  . Calcium Carb-Cholecalciferol (CALCIUM + D3 PO) Take by mouth.  . calcium carbonate (OSCAL) 1500 (600 Ca) MG TABS tablet Take 600 mg of elemental calcium by mouth daily.  . diclofenac Sodium (VOLTAREN) 1 % GEL Apply topically 4 (four) times daily.  . ferrous sulfate 325 (65 FE) MG tablet Take 325 mg by mouth daily with breakfast.  . fluticasone (FLONASE) 50 MCG/ACT nasal spray Place 1 spray into both nostrils daily.  Marland Kitchen FREESTYLE LITE test strip   . Ginger, Zingiber officinalis, (GINGER PO) Take by mouth.  . Glucosamine-Chondroitin (GLUCOSAMINE CHONDR COMPLEX PO) Take by mouth 2 (two) times daily.   . hydrochlorothiazide (HYDRODIURIL) 12.5 MG tablet Take 1 tablet by mouth daily.  Marland Kitchen ibuprofen (ADVIL,MOTRIN) 200 MG tablet Take 200-800 mg by mouth every 6 (six) hours as needed for fever, headache,  mild pain, moderate pain or cramping.   Marland Kitchen ketoconazole (NIZORAL) 2 % shampoo APPLY SHAMPOO AND LATHER 3 TIMES WEEKLY. LEAVE ON FOR 5-8 MINUTES RINSE WELL  . Lancets (FREESTYLE) lancets Use to test your blood sugar once a day  . loratadine (CLARITIN) 10 MG tablet Take 10 mg by mouth daily.  Marland Kitchen losartan-hydrochlorothiazide (HYZAAR) 50-12.5 MG per tablet Take 1 tablet by mouth daily.  . metFORMIN (GLUCOPHAGE-XR) 500 MG 24 hr tablet Take 500 mg by mouth at bedtime.  . Mometasone Furoate (ASMANEX HFA) 200 MCG/ACT AERO Inhale 2 puffs into the lungs 2 (two) times daily as needed (Asthmatic cough).  . Multiple Vitamin (MULTIVITAMIN WITH MINERALS) TABS tablet Take 1 tablet by mouth daily.  Marland Kitchen  rosuvastatin (CRESTOR) 5 MG tablet Take 5 mg by mouth daily.   . tamsulosin (FLOMAX) 0.4 MG CAPS capsule Take 1 capsule by mouth daily.   No current facility-administered medications on file prior to visit.    Review of Systems Per HPI unless specifically indicated above      Objective:    BP 134/60 (BP Location: Left Arm, Cuff Size: Normal)   Pulse 85   Temp 97.8 F (36.6 C) (Temporal)   Resp 16   Ht 5\' 8"  (1.727 m)   Wt 171 lb 12.8 oz (77.9 kg)   SpO2 99%   BMI 26.12 kg/m   Wt Readings from Last 3 Encounters:  01/11/20 171 lb 12.8 oz (77.9 kg)  09/05/19 173 lb 6.4 oz (78.7 kg)  08/08/19 171 lb 9.6 oz (77.8 kg)    Physical Exam Vitals and nursing note reviewed.  Constitutional:      General: He is not in acute distress.    Appearance: He is well-developed. He is not diaphoretic.     Comments: Well-appearing, comfortable, cooperative  HENT:     Head: Normocephalic and atraumatic.  Eyes:     General:        Right eye: No discharge.        Left eye: No discharge.     Conjunctiva/sclera: Conjunctivae normal.  Neck:     Thyroid: No thyromegaly.  Cardiovascular:     Rate and Rhythm: Normal rate and regular rhythm.     Heart sounds: Normal heart sounds. No murmur heard.   Pulmonary:      Effort: Pulmonary effort is normal. No respiratory distress.     Breath sounds: Normal breath sounds. No wheezing or rales.  Musculoskeletal:        General: Normal range of motion.     Cervical back: Normal range of motion and neck supple.  Lymphadenopathy:     Cervical: No cervical adenopathy.  Skin:    General: Skin is warm and dry.     Findings: No erythema or rash.  Neurological:     Mental Status: He is alert and oriented to person, place, and time.  Psychiatric:        Behavior: Behavior normal.     Comments: Well groomed, good eye contact, normal speech and thoughts       Results for orders placed or performed in visit on 08/30/19  Nitric oxide  Result Value Ref Range   Clinical Information 29       Assessment & Plan:   Problem List Items Addressed This Visit    Type 2 diabetes mellitus with other specified complication ([place])    Previously controlled A1c, request records Not due for K3T today Complications - hyperlipidemia increases risk of future cardiovascular complications   Plan:  1. Continue current therapy - Metformin XR 500mg  daily 2. Encourage improved lifestyle - low carb, low sugar diet, reduce portion size, continue improving regular exercise 3. Check CBG, bring log to next visit for review 4. Continue ARB, Statin 5. Next visit due DM Foot / will need DM Eye exam      Primary osteoarthritis involving multiple joints    Chronic problem, multiple joints Controlled on various therapy including topical nsaid, oral nsaid Trial on Relief Factor      Pancreatic cyst    Pancreatic cyst, stable on MRI last 10/2019 Due in 6 month repeat, review records, other cysts If abnormal or other concerns consider GI referral      Kidney  cysts   Hyperlipidemia associated with type 2 diabetes mellitus ([facility])    Controlled cholesterol on statin lifestyle  Plan: 1. Continue current meds - Rosuvastatin 5mg  daily 2. Encourage improved lifestyle - low  carb/cholesterol, reduce portion size, continue improving regular exercise  Labs 4 months      Essential hypertension - Primary    Mildly elevated initial BP, repeat manual check improved. - Home BP readings reviewed, normal at home, elevated here in office usually  No known complications     Plan:  1. Continue current BP regimen - Losartan-HCTZ 50-12.5mg  daily, Amlodipine 5mg  daily 2. Encourage improved lifestyle - low sodium diet, regular exercise 3. Continue monitor BP outside office, bring readings to next visit, if persistently >140/90 or new symptoms notify office sooner      BPH associated with nocturia    BPH, improved on Tamsulosin 0.4mg  daily - still has some LUTS May re-eval and consider double dose S/p procedural intervention laser 2010 improved Prior PSA DRE normal No f/u with Urology anymore Return 4 months PSA , DRE       Other Visit Diagnoses    Encounter to establish care with new doctor          Requested outside records. Patient has provided records today. Will abstract / scan to chart.  No orders of the defined types were placed in this encounter.     Follow up plan: Return in about 4 months (around 05/12/2020) for Annual Physical.   Future labs 05/08/20  Notify office when ready for MRI Abdomen to be ordered 1 month prior to visit.  Nobie Putnam, [facility] 01/11/2020, 10:32 AM

## 2020-01-11 NOTE — Assessment & Plan Note (Addendum)
Pancreatic cyst, stable on MRI last 10/2019 Due in 6 month repeat, review records, other cysts If abnormal or other concerns consider GI referral

## 2020-01-11 NOTE — Patient Instructions (Addendum)
Thank you for coming to the office today.  For the MRI abdomen to follow-up on the cysts. Please send me a MyChart message or call and leave [place] a detailed message approximately 1 month before your upcoming appointment in October 2021, and we can ORDER and SCHEDULE your MRI at that time, so you will have results hopefully before you see me.  Contact us directly for any medicine refills we can order to your mail order pharmacy.  DUE for FASTING BLOOD WORK (no food or drink after midnight before the lab appointment, only water or coffee without cream/sugar on the morning of)  SCHEDULE "Lab Only" visit in the morning at the clinic for lab draw in 4 MONTHS   - Make sure Lab Only appointment is at about 1 week before your next appointment, so that results will be available  For Lab Results, once available within 2-3 days of blood draw, you can can log in to MyChart online to view your results and a brief explanation. Also, we can discuss results at next follow-up visit.   Please schedule a Follow-up Appointment to: Return in about 4 months (around 05/12/2020) for Annual Physical.  If you have any other questions or concerns, please feel free to call the office or send a message through [place]. You may also schedule an earlier appointment if necessary.  Additionally, you may be receiving a survey about your experience at our office within a few days to 1 week by e-mail or mail. We value your feedback.  Nobie Putnam, DO [place]

## 2020-01-11 NOTE — Assessment & Plan Note (Signed)
Controlled cholesterol on statin lifestyle  Plan: 1. Continue current meds - Rosuvastatin 5mg  daily 2. Encourage improved lifestyle - low carb/cholesterol, reduce portion size, continue improving regular exercise  Labs 4 months

## 2020-01-11 NOTE — Assessment & Plan Note (Signed)
BPH, improved on Tamsulosin 0.4mg  daily - still has some LUTS May re-eval and consider double dose S/p procedural intervention laser 2010 improved Prior PSA DRE normal No f/u with Urology anymore Return 4 months PSA , DRE

## 2020-01-11 NOTE — Assessment & Plan Note (Signed)
Previously controlled A1c, request records Not due for H3J today Complications - hyperlipidemia increases risk of future cardiovascular complications   Plan:  1. Continue current therapy - Metformin XR 500mg  daily 2. Encourage improved lifestyle - low carb, low sugar diet, reduce portion size, continue improving regular exercise 3. Check CBG, bring log to next visit for review 4. Continue ARB, Statin 5. Next visit due DM Foot / will need DM Eye exam

## 2020-01-13 ENCOUNTER — Encounter: Payer: Self-pay | Admitting: Family Medicine

## 2020-01-14 NOTE — Telephone Encounter (Signed)
Dr. Chase Caller, this patient has a question about the Covid vaccine. Please advise.  Doctor Chase Caller first off I hope your doing well and thank you for your dedication to all your patient.  My question is, I don't want to take the Covid  19 vaccination unless I'm force to in order to go to [place] next March 2022. What is your opinion in taking Hydroxychloroquine as a prophylactic before my trip ? I figure you probably have first hand knowledge. Thanks in Advance for any help you can give me.

## 2020-01-20 NOTE — Telephone Encounter (Signed)
Hydroxychloroquine was received with a lot of enthusiasm early on in the pandemic a year ago.  Since then multiple studies have been done and it shows it has no benefit on prophylaxis to prevent Covid based on randomized control trials if you really want to take hydroxychloroquine I am okay to prescribe that for him  If [place] will let him go in without a vaccine then I would suggest that he continue to mask and avoid human clustering which are pretty effective in preventing Covid  In the event he gets Covid this 1 study to show colchicine [gout medication] for 3 weeks can prevent him from getting deadly Covid.   This is in the event he gets Covid.  Also Regeneron monoclonal antibody is a good prophylaxis if he gets exposed to someone with Covid or if he gets Covid to get as a treatment to prevent hospitalization.  However this is subject availability and [place]

## 2020-02-11 NOTE — Telephone Encounter (Signed)
  I think either Pfizer or Moderna vaccine is fine against COVID-19.  No real difference between the 2 other than transport and storage differences.  May be, [place] has less arm soreness according to social media reports but really cannot say that to be true.  But definitely the source of.  Compared to [employer] or [employer].  Triage: Please give patient information to get Covid vaccine at multiple different locations.  You can show him the [facility] website or link him to the [facility] health location for vaccine.  Please also let him know that I love [place] in which places he going and where is he going?  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 Ciprofloxacin Diarrhea and Nausea And Vomiting  . Duloxetine Other (See Comments)    Severe fatique and constipation.  . Morphine And Related Diarrhea and Nausea And Vomiting

## 2020-02-11 NOTE — Telephone Encounter (Signed)
Dr. Chase Caller, please advise.  Doctor Brandt, Looks like my fight not to get the vaccine just ended. [place] is requiring it, so if I want to fill my life long dream of going to [place].   My question is which Vaccine do you recommend and where do I go to make sure I get the vaccine you recommend.               Thanks in advance for any help you can give me                                                 Sincerely                                              Esperanza Richters

## 2020-02-14 NOTE — Telephone Encounter (Signed)
Personal message to Dr. Chase Caller.

## 2020-02-15 ENCOUNTER — Telehealth: Payer: Self-pay | Admitting: Internal Medicine

## 2020-02-15 NOTE — Telephone Encounter (Signed)
[facility] in [place], [place]. [employer] day. Best wishes

## 2020-02-21 ENCOUNTER — Ambulatory Visit: Payer: PPO | Admitting: Dermatology

## 2020-02-21 ENCOUNTER — Other Ambulatory Visit: Payer: Self-pay

## 2020-02-21 DIAGNOSIS — L820 Inflamed seborrheic keratosis: Secondary | ICD-10-CM

## 2020-02-21 DIAGNOSIS — D485 Neoplasm of uncertain behavior of skin: Secondary | ICD-10-CM

## 2020-02-21 DIAGNOSIS — L578 Other skin changes due to chronic exposure to nonionizing radiation: Secondary | ICD-10-CM | POA: Diagnosis not present

## 2020-02-21 DIAGNOSIS — D492 Neoplasm of unspecified behavior of bone, soft tissue, and skin: Secondary | ICD-10-CM

## 2020-02-21 DIAGNOSIS — L570 Actinic keratosis: Secondary | ICD-10-CM | POA: Diagnosis not present

## 2020-02-21 DIAGNOSIS — C4442 Squamous cell carcinoma of skin of scalp and neck: Secondary | ICD-10-CM | POA: Diagnosis not present

## 2020-02-21 NOTE — Progress Notes (Signed)
   Follow-Up Visit   Subjective  Isaac Weber is a 74 y.o. male who presents for the following: Actinic Keratosis (51m f/u arms hands, face and ears) and ISK f/u (arms, back).  The following portions of the chart were reviewed this encounter and updated as appropriate:  Tobacco  Allergies  Meds  Problems  Med Hx  Surg Hx  Fam Hx     Review of Systems:  No other skin or systemic complaints except as noted in HPI or Assessment and Plan.  Objective  Well appearing patient in no apparent distress; mood and affect are within normal limits.  A focused examination was performed including face, ears, arms, hands, back. Relevant physical exam findings are noted in the Assessment and Plan.  Objective  arms/trunk x 7 (7): Erythematous keratotic or waxy stuck-on papule or plaque.   Objective  R neck proximal mandible: Crusted pap 0.6cm  Objective  ears, face x 5 (5): Pink scaly macules    Assessment & Plan    Actinic Damage - diffuse scaly erythematous macules with underlying dyspigmentation - Recommend daily broad spectrum sunscreen SPF 30+ to sun-exposed areas, reapply every 2 hours as needed.  - Call for new or changing lesions.   Inflamed seborrheic keratosis (7) arms/trunk x 7  Destruction of lesion - arms/trunk x 7 Complexity: simple   Destruction method: cryotherapy   Informed consent: discussed and consent obtained   Timeout:  patient name, date of birth, surgical site, and procedure verified Lesion destroyed using liquid nitrogen: Yes   Region frozen until ice ball extended beyond lesion: Yes   Outcome: patient tolerated procedure well with no complications   Post-procedure details: wound care instructions given    Neoplasm of skin R neck proximal mandible  Skin / nail biopsy Type of biopsy: tangential   Informed consent: discussed and consent obtained   Timeout: patient name, date of birth, surgical site, and procedure verified   Procedure prep:   Patient was prepped and draped in usual sterile fashion Prep type:  Isopropyl alcohol Anesthesia: the lesion was anesthetized in a standard fashion   Anesthetic:  1% lidocaine w/ epinephrine 1-100,000 buffered w/ 8.4% NaHCO3 Instrument used: flexible razor blade   Outcome: patient tolerated procedure well   Post-procedure details: sterile dressing applied and wound care instructions given   Dressing type: bandage and petrolatum    Specimen 1 - Surgical pathology Differential Diagnosis: D48.5 R/O SCC Check Margins: No Crusted pap 0.6cm  AK (actinic keratosis) (5) ears, face x 5  Destruction of lesion - ears, face x 5 Complexity: simple   Destruction method: cryotherapy   Informed consent: discussed and consent obtained   Timeout:  patient name, date of birth, surgical site, and procedure verified Lesion destroyed using liquid nitrogen: Yes   Region frozen until ice ball extended beyond lesion: Yes   Outcome: patient tolerated procedure well with no complications   Post-procedure details: wound care instructions given    Return in about 6 months (around 08/23/2020) for AK, ISK.   I, Othelia Pulling, RMA, am acting as scribe for Sarina Ser, MD .  Documentation: I have reviewed the above documentation for accuracy and completeness, and I agree with the above.  Sarina Ser, MD

## 2020-02-21 NOTE — Patient Instructions (Signed)

## 2020-02-22 ENCOUNTER — Other Ambulatory Visit: Payer: Self-pay | Admitting: *Deleted

## 2020-02-22 MED ORDER — ASMANEX HFA 200 MCG/ACT IN AERO: 2.0000 | INHALATION_SPRAY | Freq: Two times a day (BID) | RESPIRATORY_TRACT | 5 refills | Status: DC | PRN

## 2020-02-25 ENCOUNTER — Encounter: Payer: Self-pay | Admitting: Dermatology

## 2020-03-03 ENCOUNTER — Telehealth: Payer: Self-pay

## 2020-03-03 NOTE — Telephone Encounter (Signed)
Patient informed appointment scheduled

## 2020-03-03 NOTE — Telephone Encounter (Signed)
-----   Message from Ralene Bathe, MD sent at 02/28/2020  6:19 PM EDT ----- Skin , right neck proximal mandible WELL DIFFERENTIATED SQUAMOUS CELL CARCINOMA, ULCERATED  Cancer - SCC Schedule for treatment (EDC)

## 2020-03-18 ENCOUNTER — Ambulatory Visit (INDEPENDENT_AMBULATORY_CARE_PROVIDER_SITE_OTHER): Payer: PPO

## 2020-03-18 VITALS — Ht 68.75 in | Wt 168.0 lb

## 2020-03-18 DIAGNOSIS — Z0000 Encounter for general adult medical examination without abnormal findings: Secondary | ICD-10-CM

## 2020-03-18 NOTE — Progress Notes (Signed)
I connected with Isaac Weber today by telephone and verified that I am speaking with the correct person using two identifiers. Location patient: home Location provider: work Persons participating in the virtual visit: [name], [name].   I discussed the limitations, risks, security and privacy concerns of performing an evaluation and management service by telephone and the availability of in person appointments. I also discussed with the patient that there may be a patient responsible charge related to this service. The patient expressed understanding and verbally consented to this telephonic visit.    Interactive audio and video telecommunications were attempted between this provider and patient, however failed, due to patient having technical difficulties OR patient did not have access to video capability.  We continued and completed visit with audio only.    Vital signs may be patient reported or missing.   Subjective:   Isaac Weber is a 74 y.o. male who presents for Medicare Annual/Subsequent preventive examination.  Review of Systems     Cardiac Risk Factors include: advanced age (>50men, >33 women);diabetes mellitus;hypertension;male gender     Objective:    Today's Vitals   03/18/20 1357  Weight: 168 lb (76.2 kg)  Height: 5' 8.75" (1.746 m)   Body mass index is 24.99 kg/m.  Advanced Directives 03/18/2020 03/09/2018 09/16/2014  Does Patient Have a Medical Advance Directive? Yes No No;Yes  Type of Paramedic of [place] Housing;Living will - [place];Living will  Copy of [place] in Chart? No - copy requested - No - copy requested  Would patient like information on creating a medical advance directive? - No - Patient declined -    Current Medications (verified) Outpatient Encounter Medications as of 03/18/2020  Medication Sig  . amLODipine (NORVASC) 5 MG tablet Take 5 mg by mouth daily.  .  Calcium Carb-Cholecalciferol (CALCIUM + D3 PO) Take by mouth.  . calcium carbonate (OSCAL) 1500 (600 Ca) MG TABS tablet Take 600 mg of elemental calcium by mouth daily.  . diclofenac Sodium (VOLTAREN) 1 % GEL Apply topically 4 (four) times daily.  . ferrous sulfate 325 (65 FE) MG tablet Take 325 mg by mouth daily with breakfast.  . fluticasone (FLONASE) 50 MCG/ACT nasal spray Place 1 spray into both nostrils daily.  Marland Kitchen FREESTYLE LITE test strip   . Ginger, Zingiber officinalis, (GINGER PO) Take by mouth.  . Glucosamine-Chondroitin (GLUCOSAMINE CHONDR COMPLEX PO) Take by mouth 2 (two) times daily.   . hydrochlorothiazide (HYDRODIURIL) 12.5 MG tablet Take 1 tablet by mouth daily.  Marland Kitchen ibuprofen (ADVIL,MOTRIN) 200 MG tablet Take 200-800 mg by mouth every 6 (six) hours as needed for fever, headache, mild pain, moderate pain or cramping.   Marland Kitchen ketoconazole (NIZORAL) 2 % shampoo APPLY SHAMPOO AND LATHER 3 TIMES WEEKLY. LEAVE ON FOR 5-8 MINUTES RINSE WELL  . Lancets (FREESTYLE) lancets Use to test your blood sugar once a day  . loratadine (CLARITIN) 10 MG tablet Take 10 mg by mouth daily.  Marland Kitchen losartan-hydrochlorothiazide (HYZAAR) 50-12.5 MG per tablet Take 1 tablet by mouth daily.  . metFORMIN (GLUCOPHAGE-XR) 500 MG 24 hr tablet Take 500 mg by mouth at bedtime.  . Mometasone Furoate (ASMANEX HFA) 200 MCG/ACT AERO Inhale 2 puffs into the lungs 2 (two) times daily as needed (Asthmatic cough).  . Multiple Vitamin (MULTIVITAMIN WITH MINERALS) TABS tablet Take 1 tablet by mouth daily.  . rosuvastatin (CRESTOR) 5 MG tablet Take 5 mg by mouth daily.   . tamsulosin (FLOMAX)  0.4 MG CAPS capsule Take 1 capsule by mouth daily.   No facility-administered encounter medications on file as of 03/18/2020.    Allergies (verified) Ciprofloxacin, Duloxetine, and Morphine and related   History: Past Medical History:  Diagnosis Date  . Abnormal chest CT   . Allergic rhinitis, unspecified   . Anemia   . Aortic  atherosclerosis ([place])   . BPH (benign prostatic hyperplasia)   . COPD (chronic obstructive pulmonary disease) ([place])   . Coronary artery disease   . Cough variant asthma   . Glaucoma   . Hypertension   . Other chronic pain   . Pancreatic lesion   . Pure hypercholesterolemia, unspecified   . 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 Tx: [facility]   Past Surgical History:  Procedure Laterality Date  . BUNIONECTOMY Left 2003   hammer toe as well  . BUNIONECTOMY WITH HAMMERTOE RECONSTRUCTION Left 2013   repeat  . CERVICAL DISC SURGERY    . GREEN LIGHT LASER TURP (TRANSURETHRAL RESECTION OF PROSTATE  2010   laser, shrink prostate  . KNEE ARTHROSCOPY Right 1991  . LEG SURGERY Left    BENIGN BONE TUMOR  . LUMBAR [place] SURGERY  2010   discectomy  . [place] SURGERY  2011  . PROSTATE SURGERY  2002   shrink prostate  . ROTATOR CUFF REPAIR Left 1999   debride, remove bonespur  . ROTATOR CUFF REPAIR Right 1996   Family History  Problem Relation Age of Onset  . Heart disease Mother 50  . Emphysema Father 63  . Heart disease Brother 43   Social History   Socioeconomic History  . Marital status: Married    Spouse name: Not on file  . Number of children: Not on file  . Years of education: high school  . Highest education level: High school graduate  Occupational History  . Occupation: retired  Tobacco Use  . Smoking status: Former Smoker    Packs/day: 3.00    Years: 26.00    Pack years: 78.00    Types: Cigarettes    Quit date: 09/17/1971    Years since quitting: 48.5  . Smokeless tobacco: Never Used  Vaping Use  . Vaping Use: Never used  Substance and Sexual Activity  . Alcohol use: Yes    Alcohol/week: 2.0 standard drinks    Types: 2 Standard drinks or equivalent per week  . Drug use: No  . Sexual activity: Not on file  Other Topics Concern  . Not on file  Social History Narrative  . Not on file   Social  Determinants of Health   Financial Resource Strain: Low Risk   . Difficulty of Paying Living Expenses: Not hard at all  Food Insecurity: No Food Insecurity  . Worried About Charity fundraiser in the Last Year: Never true  . Ran Out of Food in the Last Year: Never true  Transportation Needs: No Transportation Needs  . Lack of Transportation (Medical): No  . Lack of Transportation (Non-Medical): No  Physical Activity: Insufficiently Active  . Days of Exercise per Week: 7 days  . Minutes of Exercise per Session: 10 min  Stress: No Stress Concern Present  . Feeling of Stress : Not at all  Social Connections:   . Frequency of Communication with Friends and Family: Not on file  . Frequency of Social Gatherings with Friends and Family: Not on file  . Attends Religious Services:  Not on file  . Active Member of Clubs or Organizations: Not on file  . Attends Archivist Meetings: Not on file  . Marital Status: Not on file    Tobacco Counseling Counseling given: Not Answered   Clinical Intake:  Pre-visit preparation completed: Yes  Pain : No/denies pain     Nutritional Status: BMI of 19-24  Normal Nutritional Risks: None Diabetes: Yes  How often do you need to have someone help you when you read instructions, pamphlets, or other written materials from your doctor or pharmacy?: 1 - Never What is the last grade level you completed in school?: 12th grade  Diabetic? Yes Nutrition Risk Assessment:  Has the patient had any N/V/D within the last 2 months?  No  Does the patient have any non-healing wounds?  No  Has the patient had any unintentional weight loss or weight gain?  No   Diabetes:  Is the patient diabetic?  Yes  If diabetic, was a CBG obtained today?  No  Did the patient bring in their glucometer from home?  No  How often do you monitor your CBG's? daily.   Financial Strains and Diabetes Management:  Are you having any financial strains with the device,  your supplies or your medication? No .  Does the patient want to be seen by Chronic Care Management for management of their diabetes?  No  Would the patient like to be referred to a Nutritionist or for Diabetic Management?  No   Diabetic Exams:  Diabetic Eye Exam: Completed 10/17/2019 Diabetic Foot Exam: Completed 11/16/2019   Interpreter Needed?: No  Information entered by :: NAllen LPN   Activities of Daily Living In your present state of health, do you have any difficulty performing the following activities: 03/18/2020 01/11/2020  Hearing? N N  Vision? N N  Difficulty concentrating or making decisions? N N  Walking or climbing stairs? N N  Dressing or bathing? N N  Doing errands, shopping? N N  Preparing Food and eating ? N -  Using the Toilet? N -  In the past six months, have you accidently leaked urine? N -  Do you have problems with loss of bowel control? N -  Managing your Medications? N -  Managing your Finances? N -  Housekeeping or managing your Housekeeping? N -  Some recent data might be hidden    Patient Care Team: Olin Hauser, DO as PCP - General (Family Medicine)  Indicate any recent Medical Services you may have received from other than [facility] providers in the past year (date may be approximate).     Assessment:   This is a routine wellness examination for Isaac Weber.  Hearing/Vision screen  Hearing Screening   125Hz  250Hz  500Hz  1000Hz  2000Hz  3000Hz  4000Hz  6000Hz  8000Hz   Right ear:           Left ear:           Vision Screening Comments: Regular eye exams, Dr. Clydene Laming, [facility]  Dietary issues and exercise activities discussed: Current Exercise Habits: Home exercise routine, Type of exercise: strength training/weights;stretching, Time (Minutes): 10, Frequency (Times/Week): 7, Weekly Exercise (Minutes/Week): 70  Goals    . Patient Stated     03/18/2020, no goal      Depression Screen PHQ 2/9 Scores 03/18/2020 01/11/2020  PHQ - 2  Score 0 0    Fall Risk Fall Risk  03/18/2020 01/11/2020  Falls in the past year? 0 0  Number falls in past  yr: - 0  Injury with Fall? - 0  Risk for fall due to : Medication side effect -  Follow up Falls evaluation completed;Education provided;Falls prevention discussed Falls evaluation completed    Any stairs in or around the home? No  If so, are there any without handrails? n/a Home free of loose throw rugs in walkways, pet beds, electrical cords, etc? Yes  Adequate lighting in your home to reduce risk of falls? Yes   ASSISTIVE DEVICES UTILIZED TO PREVENT FALLS:  Life alert? No  Use of a cane, walker or w/c? No  Grab bars in the bathroom? Yes  Shower chair or bench in shower? Yes  Elevated toilet seat or a handicapped toilet? Yes   TIMED UP AND GO:  Was the test performed? No . .    Cognitive Function:     6CIT Screen 03/18/2020  What Year? 0 points  What month? 0 points  What time? 0 points  Count back from 20 0 points  Months in reverse 0 points  Repeat phrase 4 points  Total Score 4    Immunizations Immunization History  Administered Date(s) Administered  . Fluad Quad(high Dose 65+) 05/13/2019  . Influenza, High Dose Seasonal PF 07/15/2017, 04/12/2018  . PFIZER SARS-COV-2 Vaccination 02/13/2020, 03/05/2020  . Pneumococcal Conjugate-13 01/28/2014  . Pneumococcal Polysaccharide-23 03/04/2008  . Tdap 07/12/2011  . Zoster Recombinat (Shingrix) 08/17/2017    TDAP status: Up to date Flu Vaccine status: Up to date Pneumococcal vaccine status: Up to date Covid-19 vaccine status: Completed vaccines  Qualifies for Shingles Vaccine? Yes   Zostavax completed Yes   Shingrix Completed?: Yes  Screening Tests Health Maintenance  Topic Date Due  . HEMOGLOBIN A1C  Never done  . Hepatitis C Screening  Never done  . PNA vac Low Risk Adult (2 of 2 - PPSV23) 01/29/2015  . INFLUENZA VACCINE  02/24/2020  . OPHTHALMOLOGY EXAM  10/16/2020  . COLONOSCOPY  10/28/2020  .  FOOT EXAM  11/15/2020  . TETANUS/TDAP  07/11/2021  . COVID-19 Vaccine  Completed    Health Maintenance  Health Maintenance Due  Topic Date Due  . HEMOGLOBIN A1C  Never done  . Hepatitis C Screening  Never done  . PNA vac Low Risk Adult (2 of 2 - PPSV23) 01/29/2015  . INFLUENZA VACCINE  02/24/2020    Colorectal cancer screening: Completed 10/28/2017. Repeat every 3 years  Lung Cancer Screening: (Low Dose CT Chest recommended if Age 74-80 years, 30 pack-year currently smoking OR have quit w/in 15years.) does not qualify.   Lung Cancer Screening Referral: no  Additional Screening:  Hepatitis C Screening: does qualify; gives blood regularly  Vision Screening: Recommended annual ophthalmology exams for early detection of glaucoma and other disorders of the eye. Is the patient up to date with their annual eye exam?  Yes  Who is the provider or what is the name of the office in which the patient attends annual eye exams? Dr. Clydene Laming If pt is not established with a provider, would they like to be referred to a provider to establish care? No .   Dental Screening: Recommended annual dental exams for proper oral hygiene  Community Resource Referral / Chronic Care Management: CRR required this visit?  No   CCM required this visit?  No      Plan:     I have personally reviewed and noted the following in the patient's chart:   . Medical and social history . Use of alcohol, tobacco or  illicit drugs  . Current medications and supplements . Functional ability and status . Nutritional status . Physical activity . Advanced directives . List of other physicians . Hospitalizations, surgeries, and ER visits in previous 12 months . Vitals . Screenings to include cognitive, depression, and falls . Referrals and appointments  In addition, I have reviewed and discussed with patient certain preventive protocols, quality metrics, and best practice recommendations. A written personalized care  plan for preventive services as well as general preventive health recommendations were provided to patient.     Kellie Simmering, LPN   [id number]   Nurse Notes:

## 2020-03-18 NOTE — Patient Instructions (Signed)
Isaac Weber , Thank you for taking time to come for your Medicare Wellness Visit. I appreciate your ongoing commitment to your health goals. Please review the following plan we discussed and let me know if I can assist you in the future.   Screening recommendations/referrals: Colonoscopy: completed 10/28/2017 Recommended yearly ophthalmology/optometry visit for glaucoma screening and checkup Recommended yearly dental visit for hygiene and checkup  Vaccinations: Influenza vaccine: due Pneumococcal vaccine: completed 01/28/2014 Tdap vaccine: completed 07/11/2014 Shingles vaccine: completed   Covid-19:  03/05/2020, 02/13/2020  Advanced directives: Please bring a copy of your POA (Power of Attorney) and/or Living Will to your next appointment.   Conditions/risks identified: none  Next appointment: Follow up in one year for your annual wellness visit.   Preventive Care 75 Years and Older, Male Preventive care refers to lifestyle choices and visits with your health care provider that can promote health and wellness. What does preventive care include?  A yearly physical exam. This is also called an annual well check.  Dental exams once or twice a year.  Routine eye exams. Ask your health care provider how often you should have your eyes checked.  Personal lifestyle choices, including:  Daily care of your teeth and gums.  Regular physical activity.  Eating a healthy diet.  Avoiding tobacco and drug use.  Limiting alcohol use.  Practicing safe sex.  Taking low doses of aspirin every day.  Taking vitamin and mineral supplements as recommended by your health care provider. What happens during an annual well check? The services and screenings done by your health care provider during your annual well check will depend on your age, overall health, lifestyle risk factors, and family history of disease. Counseling  Your health care provider may ask you questions about your:  Alcohol  use.  Tobacco use.  Drug use.  Emotional well-being.  Home and relationship well-being.  Sexual activity.  Eating habits.  History of falls.  Memory and ability to understand (cognition).  Work and work Statistician. Screening  You may have the following tests or measurements:  Height, weight, and BMI.  Blood pressure.  Lipid and cholesterol levels. These may be checked every 5 years, or more frequently if you are over 37 years old.  Skin check.  Lung cancer screening. You may have this screening every year starting at age 66 if you have a 30-pack-year history of smoking and currently smoke or have quit within the past 15 years.  Fecal occult blood test (FOBT) of the stool. You may have this test every year starting at age 4.  Flexible sigmoidoscopy or colonoscopy. You may have a sigmoidoscopy every 5 years or a colonoscopy every 10 years starting at age 75.  Prostate cancer screening. Recommendations will vary depending on your family history and other risks.  Hepatitis C blood test.  Hepatitis B blood test.  Sexually transmitted disease (STD) testing.  Diabetes screening. This is done by checking your blood sugar (glucose) after you have not eaten for a while (fasting). You may have this done every 1-3 years.  Abdominal aortic aneurysm (AAA) screening. You may need this if you are a current or former smoker.  Osteoporosis. You may be screened starting at age 23 if you are at high risk. Talk with your health care provider about your test results, treatment options, and if necessary, the need for more tests. Vaccines  Your health care provider may recommend certain vaccines, such as:  Influenza vaccine. This is recommended every year.  Tetanus,  diphtheria, and acellular pertussis (Tdap, Td) vaccine. You may need a Td booster every 10 years.  Zoster vaccine. You may need this after age 32.  Pneumococcal 13-valent conjugate (PCV13) vaccine. One dose is  recommended after age 83.  Pneumococcal polysaccharide (PPSV23) vaccine. One dose is recommended after age 62. Talk to your health care provider about which screenings and vaccines you need and how often you need them. This information is not intended to replace advice given to you by your health care provider. Make sure you discuss any questions you have with your health care provider. Document Released: 08/08/2015 Document Revised: 03/31/2016 Document Reviewed: 05/13/2015 Elsevier Interactive Patient Education  2017 [place] Prevention in the Home Falls can cause injuries. They can happen to people of all ages. There are many things you can do to make your home safe and to help prevent falls. What can I do on the outside of my home?  Regularly fix the edges of walkways and driveways and fix any cracks.  Remove anything that might make you trip as you walk through a door, such as a raised step or threshold.  Trim any bushes or trees on the path to your home.  Use bright outdoor lighting.  Clear any walking paths of anything that might make someone trip, such as rocks or tools.  Regularly check to see if handrails are loose or broken. Make sure that both sides of any steps have handrails.  Any raised decks and porches should have guardrails on the edges.  Have any leaves, snow, or ice cleared regularly.  Use sand or salt on walking paths during winter.  Clean up any spills in your garage right away. This includes oil or grease spills. What can I do in the bathroom?  Use night lights.  Install grab bars by the toilet and in the tub and shower. Do not use towel bars as grab bars.  Use non-skid mats or decals in the tub or shower.  If you need to sit down in the shower, use a plastic, non-slip stool.  Keep the floor dry. Clean up any water that spills on the floor as soon as it happens.  Remove soap buildup in the tub or shower regularly.  Attach bath mats  securely with double-sided non-slip rug tape.  Do not have throw rugs and other things on the floor that can make you trip. What can I do in the bedroom?  Use night lights.  Make sure that you have a light by your bed that is easy to reach.  Do not use any sheets or blankets that are too big for your bed. They should not hang down onto the floor.  Have a firm chair that has side arms. You can use this for support while you get dressed.  Do not have throw rugs and other things on the floor that can make you trip. What can I do in the kitchen?  Clean up any spills right away.  Avoid walking on wet floors.  Keep items that you use a lot in easy-to-reach places.  If you need to reach something above you, use a strong step stool that has a grab bar.  Keep electrical cords out of the way.  Do not use floor polish or wax that makes floors slippery. If you must use wax, use non-skid floor wax.  Do not have throw rugs and other things on the floor that can make you trip. What can I do with  my stairs?  Do not leave any items on the stairs.  Make sure that there are handrails on both sides of the stairs and use them. Fix handrails that are broken or loose. Make sure that handrails are as long as the stairways.  Check any carpeting to make sure that it is firmly attached to the stairs. Fix any carpet that is loose or worn.  Avoid having throw rugs at the top or bottom of the stairs. If you do have throw rugs, attach them to the floor with carpet tape.  Make sure that you have a light switch at the top of the stairs and the bottom of the stairs. If you do not have them, ask someone to add them for you. What else can I do to help prevent falls?  Wear shoes that:  Do not have high heels.  Have rubber bottoms.  Are comfortable and fit you well.  Are closed at the toe. Do not wear sandals.  If you use a stepladder:  Make sure that it is fully opened. Do not climb a closed  stepladder.  Make sure that both sides of the stepladder are locked into place.  Ask someone to hold it for you, if possible.  Clearly mark and make sure that you can see:  Any grab bars or handrails.  First and last steps.  Where the edge of each step is.  Use tools that help you move around (mobility aids) if they are needed. These include:  Canes.  Walkers.  Scooters.  Crutches.  Turn on the lights when you go into a dark area. Replace any light bulbs as soon as they burn out.  Set up your furniture so you have a clear path. Avoid moving your furniture around.  If any of your floors are uneven, fix them.  If there are any pets around you, be aware of where they are.  Review your medicines with your doctor. Some medicines can make you feel dizzy. This can increase your chance of falling. Ask your doctor what other things that you can do to help prevent falls. This information is not intended to replace advice given to you by your health care provider. Make sure you discuss any questions you have with your health care provider. Document Released: 05/08/2009 Document Revised: 12/18/2015 Document Reviewed: 08/16/2014 Elsevier Interactive Patient Education  2017 [employer].

## 2020-03-25 ENCOUNTER — Ambulatory Visit: Payer: PPO | Admitting: Podiatry

## 2020-03-25 ENCOUNTER — Other Ambulatory Visit: Payer: Self-pay

## 2020-03-25 ENCOUNTER — Encounter: Payer: Self-pay | Admitting: Podiatry

## 2020-03-25 DIAGNOSIS — L600 Ingrowing nail: Secondary | ICD-10-CM

## 2020-03-25 DIAGNOSIS — L603 Nail dystrophy: Secondary | ICD-10-CM

## 2020-03-26 ENCOUNTER — Encounter: Payer: Self-pay | Admitting: Podiatry

## 2020-03-26 NOTE — Progress Notes (Signed)
Subjective:  Patient ID: Isaac Weber, male    DOB: 1946/07/14,  MRN: [id number]  Chief Complaint  Patient presents with   Nail Problem    Patient presents for nail fungus most toes left foot and right 3rd ingrown toenail    74 y.o. male presents with the above complaint.  Patient presents with right third medial border ingrown.  Patient states is painful to touch.  He has previously seen me for routine foot care.  However this ingrown has been bothering for quite some time.  He would like to have it removed.  It hurts when ambulating.  He denies any other acute complaints.   Review of Systems: Negative except as noted in the HPI. Denies N/V/F/Ch.  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 Tx: EDC    Current Outpatient Medications:    amLODipine (NORVASC) 5 MG tablet, Take 5 mg by mouth daily., Disp: , Rfl:    Calcium Carb-Cholecalciferol (CALCIUM + D3 PO), Take by mouth., Disp: , Rfl:    diclofenac Sodium (VOLTAREN) 1 % GEL, Apply topically 4 (four) times daily., Disp: , Rfl:    ferrous sulfate 325 (65 FE) MG tablet, Take 325 mg by mouth daily with breakfast., Disp: , Rfl:    fluticasone (FLONASE) 50 MCG/ACT nasal spray, Place 1 spray into both nostrils daily., Disp: 16 g, Rfl: 4   FREESTYLE LITE test strip, , Disp: , Rfl:    Ginger, Zingiber officinalis, (GINGER PO), Take by mouth., Disp: , Rfl:    Glucosamine-Chondroitin (GLUCOSAMINE CHONDR COMPLEX PO), Take by mouth 2 (two) times daily. , Disp: , Rfl:     hydrochlorothiazide (HYDRODIURIL) 12.5 MG tablet, Take 1 tablet by mouth daily., Disp: , Rfl:    ibuprofen (ADVIL,MOTRIN) 200 MG tablet, Take 200-800 mg by mouth every 6 (six) hours as needed for fever, headache, mild pain, moderate pain or cramping. , Disp: , Rfl:    ketoconazole (NIZORAL) 2 % shampoo, APPLY SHAMPOO AND LATHER 3 TIMES WEEKLY. LEAVE ON FOR 5-8 MINUTES RINSE WELL, Disp: 120 mL, Rfl: 3   Lancets (FREESTYLE) lancets, Use to test your blood sugar once a day, Disp: , Rfl:    loratadine (CLARITIN) 10 MG tablet, Take 10 mg by mouth daily., Disp: , Rfl:    losartan (COZAAR) 50 MG tablet, Take 50 mg by mouth daily., Disp: , Rfl:    losartan-hydrochlorothiazide (HYZAAR) 50-12.5 MG per tablet, Take 1 tablet by mouth daily., Disp: , Rfl:    metFORMIN (GLUCOPHAGE-XR) 500 MG 24 hr tablet, Take 500 mg by mouth at bedtime., Disp: , Rfl:    Mometasone Furoate (ASMANEX HFA) 200 MCG/ACT AERO, Inhale 2 puffs into the lungs 2 (two) times daily as needed (Asthmatic cough)., Disp: 13 g, Rfl: 5   Multiple Vitamin (MULTIVITAMIN WITH MINERALS) TABS tablet, Take 1 tablet by mouth daily., Disp: , Rfl:    rosuvastatin (CRESTOR) 5 MG tablet, Take 5 mg by mouth daily. , Disp: ,  Rfl:    tamsulosin (FLOMAX) 0.4 MG CAPS capsule, Take 1 capsule by mouth daily., Disp: , Rfl:   Social History   Tobacco Use  Smoking Status Former Smoker   Packs/day: 3.00   Years: 26.00   Pack years: 78.00   Types: Cigarettes   Quit date: 09/17/1971   Years since quitting: 48.5  Smokeless Tobacco Never Used    Allergies  Allergen Reactions   Ciprofloxacin Diarrhea and Nausea And Vomiting   Duloxetine Other (See Comments)    Severe fatique and constipation.   Morphine And Related Diarrhea and Nausea And Vomiting   Objective:  There were no vitals filed for this visit. There is no height or weight on file to calculate BMI. Constitutional Well developed. Well nourished.  Vascular Dorsalis pedis  pulses palpable bilaterally. Posterior tibial pulses palpable bilaterally. Capillary refill normal to all digits.  No cyanosis or clubbing noted. Pedal hair growth normal.  Neurologic Normal speech. Oriented to person, place, and time. Epicritic sensation to light touch grossly present bilaterally.  Dermatologic Painful ingrowing nail at medial nail borders of the third toe nail right. No other open wounds. No skin lesions.  Orthopedic: Normal joint ROM without pain or crepitus bilaterally. No visible deformities. No bony tenderness.   Radiographs: None Assessment:   1. Nail dystrophy   2. Ingrown nail of third toe of right foot    Plan:  Patient was evaluated and treated and all questions answered.  Ingrown Nail, right with underlying nail dystrophy -Patient elects to proceed with minor surgery to remove ingrown toenail removal today. Consent reviewed and signed by patient. -Ingrown nail excised. See procedure note. -Educated on post-procedure care including soaking. Written instructions provided and reviewed. -Patient to follow up in 2 weeks for nail check.  Procedure: Excision of Ingrown Toenail Location: Right 3rd toe medial nail borders. Anesthesia: Lidocaine 1% plain; 1.5 mL and Marcaine 0.5% plain; 1.5 mL, digital block. Skin Prep: Betadine. Dressing: Silvadene; telfa; dry, sterile, compression dressing. Technique: Following skin prep, the toe was exsanguinated and a tourniquet was secured at the base of the toe. The affected nail border was freed, split with a nail splitter, and excised. Chemical matrixectomy was then performed with phenol and irrigated out with alcohol. The tourniquet was then removed and sterile dressing applied. Disposition: Patient tolerated procedure well. Patient to return in 2 weeks for follow-up.   No follow-ups on file.

## 2020-04-29 ENCOUNTER — Other Ambulatory Visit: Payer: Self-pay

## 2020-04-29 ENCOUNTER — Encounter: Payer: Self-pay | Admitting: Dermatology

## 2020-04-29 ENCOUNTER — Ambulatory Visit: Payer: PPO | Admitting: Dermatology

## 2020-04-29 DIAGNOSIS — C4492 Squamous cell carcinoma of skin, unspecified: Secondary | ICD-10-CM

## 2020-04-29 DIAGNOSIS — C4442 Squamous cell carcinoma of skin of scalp and neck: Secondary | ICD-10-CM | POA: Diagnosis not present

## 2020-04-29 DIAGNOSIS — L578 Other skin changes due to chronic exposure to nonionizing radiation: Secondary | ICD-10-CM

## 2020-04-29 DIAGNOSIS — L820 Inflamed seborrheic keratosis: Secondary | ICD-10-CM

## 2020-04-29 NOTE — Patient Instructions (Signed)

## 2020-04-29 NOTE — Progress Notes (Signed)
   Follow-Up Visit   Subjective  Isaac Weber is a 74 y.o. male who presents for the following: SCC bx proven (R neck proximal mandible, pt presents for treatment today) and itchy spot (back >76m).  The following portions of the chart were reviewed this encounter and updated as appropriate:  Tobacco  Allergies  Meds  Problems  Med Hx  Surg Hx  Fam Hx     Review of Systems:  No other skin or systemic complaints except as noted in HPI or Assessment and Plan.  Objective  Well appearing patient in no apparent distress; mood and affect are within normal limits.  A focused examination was performed including face, neck, back. Relevant physical exam findings are noted in the Assessment and Plan.  Objective  Right neck proximal mandible: Pink bx site  Objective  back x 3 (3): Erythematous keratotic or waxy stuck-on papule or plaque.    Assessment & Plan    Actinic Damage - diffuse scaly erythematous macules with underlying dyspigmentation - Recommend daily broad spectrum sunscreen SPF 30+ to sun-exposed areas, reapply every 2 hours as needed.  - Call for new or changing lesions.   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  Destruction of lesion Complexity: extensive   Destruction method: electrodesiccation and curettage   Informed consent: discussed and consent obtained   Timeout:  patient name, date of birth, surgical site, and procedure verified Procedure prep:  Patient was prepped and draped in usual sterile fashion Prep type:  Isopropyl alcohol Anesthesia: the lesion was anesthetized in a standard fashion   Anesthetic:  1% lidocaine w/ epinephrine 1-100,000 buffered w/ 8.4% NaHCO3 Curettage performed in three different directions: Yes   Electrodesiccation performed over the curetted area: Yes   Lesion length (cm):  0.6 Lesion width (cm):  0.6 Margin per side (cm):  0.3 Final wound size (cm):  1.2 Hemostasis achieved with:  pressure, aluminum  chloride and electrodesiccation Outcome: patient tolerated procedure well with no complications   Post-procedure details: sterile dressing applied and wound care instructions given   Dressing type: bandage and petrolatum    Bx proven 02/21/20  Shave removal and EDC today.  Inflamed seborrheic keratosis (3) back x 3  Destruction of lesion - back x 3 Complexity: simple   Destruction method: cryotherapy   Informed consent: discussed and consent obtained   Timeout:  patient name, date of birth, surgical site, and procedure verified Lesion destroyed using liquid nitrogen: Yes   Region frozen until ice ball extended beyond lesion: Yes   Outcome: patient tolerated procedure well with no complications   Post-procedure details: wound care instructions given    Actinic Damage - diffuse scaly erythematous macules with underlying dyspigmentation - Recommend daily broad spectrum sunscreen SPF 30+ to sun-exposed areas, reapply every 2 hours as needed.  - Call for new or changing lesions.  Return for as scheduled 08/25/2020 for [facility], [facility] f/u.  I, Othelia Pulling, RMA, am acting as scribe for Sarina Ser, MD .  Documentation: I have reviewed the above documentation for accuracy and completeness, and I agree with the above.  Sarina Ser, MD

## 2020-04-30 ENCOUNTER — Encounter: Payer: Self-pay | Admitting: Dermatology

## 2020-04-30 ENCOUNTER — Telehealth: Payer: Self-pay | Admitting: Family Medicine

## 2020-04-30 DIAGNOSIS — K862 Cyst of pancreas: Secondary | ICD-10-CM

## 2020-04-30 NOTE — Telephone Encounter (Signed)
Due for 6 month surveillance Abdomen MRI before his apt on 06/09/20.  Last image MRI Abdomen with and without contrast 11/14/19, showed Pancreatic Cyst largest 1.7 cm, unchanged from prior test in 2020.  Order in for MRI Abdomen. To be scheduled before his apt.  FYI  Will need insurance authorization prior to scheduling.  Please notify patient when scheduled.  Nobie Putnam, DO [facility] 04/30/2020, 9:24 AM  (Copy of last imaging result)  CLINICAL DATA:  Follow-up pancreatic lesions. Pancreatic cyst follow-up  EXAM: MRI ABDOMEN WITHOUT AND WITH CONTRAST  TECHNIQUE: Multiplanar multisequence MR imaging of the abdomen was performed both before and after the administration of intravenous contrast.  CONTRAST:  39mL MULTIHANCE GADOBENATE DIMEGLUMINE 529 MG/ML IV SOLN  COMPARISON:  Previous imaging from 04/24/2019  FINDINGS: Lower chest: Incidental imaging of the lung bases is unremarkable. Limited assessment on MRI. Assessment limited by motion on coronal images.  Hepatobiliary: No signs of fat or iron deposition in the liver. Sludge layers in the gallbladder, also with small gallstone in the dependent portion approximately 1 cm. No biliary ductal dilation.  Pancreas: Cystic lesions in the tail of the pancreas, largest (image 15, series 9) 1.7 cm when measured in the coronal plane previously 1.7 cm. Smaller cystic lesions towards the periphery are similar.  Next largest cystic area 11 mm, also in the tail of the pancreas (image 13, series 9) unchanged from previous study. Adjacent smaller lesion is stable less than a cm. Other tiny lesions along the course of the pancreas in the 2-3 mm range are unchanged. A junction of body and tail open (image 36, series 9) stable 7 mm cystic lesion.  Neck of the pancreas (image 21, series 9) 14 mm unchanged from previous exam. Other small pancreatic lesions in the  central portion, head and neck of the pancreas are stable. No significant main duct dilation.  No signs of abnormal enhancement in these areas.  Spleen: Spleen with stable appearing cystic splenic lesion. Compatible with simple cysts.  Adrenals/Urinary Tract: Adrenal glands are normal. Renal cysts bilaterally.  Stomach/Bowel: Limited assessment of the gastrointestinal tract is unremarkable.  Vascular/Lymphatic: Vascular structures in the abdomen are patent. No adenopathy.  Other:  No ascites.  No ascites  Musculoskeletal: Unremarkable appearance of the visualized bony structures.  IMPRESSION: 1. 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 Follow-up MRI in 6 months with MRCP with and without contrast is suggested. This recommendation follows ACR consensus guidelines: Management of Incidental Pancreatic Cysts: A White Paper of the ACR Incidental Findings Committee. J Am Coll Radiol 9449;[id number]. 2. Stable splenic and renal cysts.   Electronically Signed   By: Zetta Bills M.D.   On: 11/14/2019 11:45

## 2020-04-30 NOTE — Telephone Encounter (Signed)
Appointment is scheduled at 05/16/2020 around 1:00 pm for MRI no PA needed.

## 2020-05-08 ENCOUNTER — Other Ambulatory Visit: Payer: PPO

## 2020-05-15 ENCOUNTER — Encounter: Payer: PPO | Admitting: Family Medicine

## 2020-05-16 ENCOUNTER — Ambulatory Visit
Admission: RE | Admit: 2020-05-16 | Discharge: 2020-05-16 | Disposition: A | Discharge status: Home / Self Care | Payer: PPO | Source: Ambulatory Visit | Attending: Family Medicine | Admitting: Family Medicine

## 2020-05-16 ENCOUNTER — Other Ambulatory Visit: Payer: Self-pay

## 2020-05-16 DIAGNOSIS — K862 Cyst of pancreas: Secondary | ICD-10-CM | POA: Insufficient documentation

## 2020-05-16 DIAGNOSIS — K8020 Calculus of gallbladder without cholecystitis without obstruction: Secondary | ICD-10-CM | POA: Diagnosis not present

## 2020-05-16 DIAGNOSIS — K828 Other specified diseases of gallbladder: Secondary | ICD-10-CM | POA: Diagnosis not present

## 2020-05-16 DIAGNOSIS — K8689 Other specified diseases of pancreas: Secondary | ICD-10-CM | POA: Diagnosis not present

## 2020-05-16 MED ORDER — GADOBUTROL 1 MMOL/ML IV SOLN
7.0000 mL | Freq: Once | INTRAVENOUS | Status: AC | PRN
  Administered 2020-05-16: 7 mL via INTRAVENOUS

## 2020-05-19 ENCOUNTER — Other Ambulatory Visit: Payer: Self-pay

## 2020-05-19 ENCOUNTER — Telehealth (INDEPENDENT_AMBULATORY_CARE_PROVIDER_SITE_OTHER): Payer: PPO | Admitting: Family Medicine

## 2020-05-19 ENCOUNTER — Encounter: Payer: Self-pay | Admitting: Family Medicine

## 2020-05-19 VITALS — BP 138/71 | Temp 98.5°F | Ht 69.0 in | Wt 162.0 lb

## 2020-05-19 DIAGNOSIS — J4531 Mild persistent asthma with (acute) exacerbation: Secondary | ICD-10-CM | POA: Diagnosis not present

## 2020-05-19 MED ORDER — ALBUTEROL SULFATE HFA 108 (90 BASE) MCG/ACT IN AERS: 1.0000 | INHALATION_SPRAY | RESPIRATORY_TRACT | 2 refills | Status: DC | PRN

## 2020-05-19 MED ORDER — PREDNISONE 50 MG PO TABS: 50.0000 mg | ORAL_TABLET | Freq: Every day | ORAL | 0 refills | Status: DC

## 2020-05-19 MED ORDER — AZITHROMYCIN 250 MG PO TABS: ORAL_TABLET | ORAL | 0 refills | Status: DC

## 2020-05-19 NOTE — Progress Notes (Signed)
Subjective:    Patient ID: Isaac Weber, male    DOB: 09-12-1945, 74 y.o.   MRN: [id number]  Isaac Weber is a 74 y.o. male presenting on 05/19/2020 for Cough (asthma denies fever or SOB onset 4 days )  Virtual / Telehealth Encounter - Video Visit via MyChart The purpose of this virtual visit is to provide medical care while limiting exposure to the novel coronavirus (COVID19) for both patient and office staff.  Consent was obtained for remote visit:  Yes.   Answered questions that patient had about telehealth interaction:  Yes.   I discussed the limitations, risks, security and privacy concerns of performing an evaluation and management service by video/telephone. I also discussed with the patient that there may be a patient responsible charge related to this service. The patient expressed understanding and agreed to proceed.  Patient Location: Home Provider Location: [facility] (Office)  HPI   Asthma Exacerbation, Cough Worsening cough in past few days. He has history of allergic asthma and cough reaction. Previously more phlegm. He admits some occasional chills and productive cough. - He follows [facility], using Flonase twice a day, Asmanex 2 puff twice a day - No albuterol has ran out of this, needs new order - No recent exacerbation of asthma - Admits productive cough - No sick contacts - UTD COVID vaccination   Depression screen [facility] 2/9 03/18/2020 01/11/2020  Decreased Interest 0 0  Down, Depressed, Hopeless 0 0  PHQ - 2 Score 0 0    Social History   Tobacco Use  . Smoking status: Former Smoker    Packs/day: 3.00    Years: 26.00    Pack years: 78.00    Types: Cigarettes    Quit date: 09/17/1971    Years since quitting: 48.7  . Smokeless tobacco: Never Used  Vaping Use  . Vaping Use: Never used  Substance Use Topics  . Alcohol use: Yes    Alcohol/week: 2.0 standard drinks    Types: 2 Standard drinks or equivalent per week  .  Drug use: No    Review of Systems Per HPI unless specifically indicated above     Objective:    BP 138/71 (BP Location: Left Arm, Patient Position: Sitting, Cuff Size: Normal)   Temp 98.5 F (36.9 C)   Ht 5\' 9"  (1.753 m)   Wt 162 lb (73.5 kg)   BMI 23.92 kg/m   Wt Readings from Last 3 Encounters:  05/19/20 162 lb (73.5 kg)  03/18/20 168 lb (76.2 kg)  01/11/20 171 lb 12.8 oz (77.9 kg)    Physical Exam   Note examination was completely remotely via video observation objective data only  Gen - well-appearing, no acute distress or apparent pain, comfortable HEENT - eyes appear clear without discharge or redness Heart/Lungs - cannot examine virtually - observed some tight coughing with bronchospasm based on sound of cough. . Speaks full sentences. No labored breathing Abd - cannot examine virtually  Skin - face visible today- no rash Neuro - awake, alert, oriented Psych - not anxious appearing   Results for orders placed or performed in visit on 01/13/20  HM COLONOSCOPY  Result Value Ref Range   HM Colonoscopy See Report (in chart) See Report (in chart), Patient Reported      Assessment & Plan:   Problem List Items Addressed This Visit    None    Visit Diagnoses    Mild persistent asthma with acute exacerbation    -  Primary   Relevant Medications   Mometasone Furoate (ASMANEX HFA) 200 MCG/ACT AERO   albuterol (VENTOLIN HFA) 108 (90 Base) MCG/ACT inhaler   predniSONE (DELTASONE) 50 MG tablet   azithromycin (ZITHROMAX Z-PAK) 250 MG tablet      Consistent with mild acute asthma exacerbation in setting of mild persistent asthma. Previously well controlled on Asmanex Current flare triggered by viral URI or allergies Limited by video visit, hearing coughing on exam suggestive of asthma No recent flare Chills but afebrile.  Plan: 1. Start Prednisone burst 50mg  daily x 5 days 2. START Albuterol 2 puffs q 4-6 hour PRN wheezing/cough/SOB x 3 days regularly, then  PRN 3. Continue Asmanex inhaler 4. Sent BACK UP PLAN - Azithromycin Z pak (antibiotic) 2 tabs day 1, then 1 tab x 4 days ONLY pick up and start if not improved within 48-72 hours  May follow up in future consider Chest X-ray  Meds ordered this encounter  Medications  . albuterol (VENTOLIN HFA) 108 (90 Base) MCG/ACT inhaler    Sig: Inhale 1-2 puffs into the lungs every 4 (four) hours as needed for wheezing or shortness of breath (cough).    Dispense:  1 each    Refill:  2  . predniSONE (DELTASONE) 50 MG tablet    Sig: Take 1 tablet (50 mg total) by mouth daily with breakfast.    Dispense:  5 tablet    Refill:  0  . azithromycin (ZITHROMAX Z-PAK) 250 MG tablet    Sig: Take 2 tabs (500mg  total) on Day 1. Take 1 tab (250mg ) daily for next 4 days.    Dispense:  6 tablet    Refill:  0      Follow up plan: Return in about 1 week (around 05/26/2020), or if symptoms worsen or fail to improve, for asthma.   Patient verbalizes understanding with the above medical recommendations including the limitation of remote medical advice.  Specific follow-up and call-back criteria were given for patient to follow-up or seek medical care more urgently if needed.  Total duration of direct patient care provided via video conference 10 minutes   Nobie Putnam, [facility] 05/19/2020, 11:50 AM

## 2020-05-19 NOTE — Patient Instructions (Addendum)
Thank you for coming to the office today.  Plan: 1. Start Prednisone burst 50mg  daily x 5 days 2. START Albuterol 2 puffs q 4-6 hour PRN wheezing/cough/SOB x 3 days regularly, then PRN 3. Continue Asmanex inhaler 4. Sent BACK UP PLAN - Azithromycin Z pak (antibiotic) 2 tabs day 1, then 1 tab x 4 days ONLY pick up and start if not improved within 48-72 hours  May follow up in future consider Chest X-ray  Please schedule a Follow-up Appointment to: Return in about 1 week (around 05/26/2020), or if symptoms worsen or fail to improve, for asthma.  If you have any other questions or concerns, please feel free to call the office or send a message through [place]. You may also schedule an earlier appointment if necessary.  Additionally, you may be receiving a survey about your experience at our office within a few days to 1 week by e-mail or mail. We value your feedback.  Nobie Putnam, DO [place]

## 2020-05-19 NOTE — Telephone Encounter (Signed)
Seen today for virtual video visit  Isaac Weber, [facility] 05/19/2020, 12:05 PM

## 2020-05-27 ENCOUNTER — Ambulatory Visit
Admission: RE | Admit: 2020-05-27 | Discharge: 2020-05-27 | Disposition: A | Discharge status: Home / Self Care | Payer: PPO | Source: Ambulatory Visit | Attending: Family Medicine | Admitting: Family Medicine

## 2020-05-27 ENCOUNTER — Telehealth (INDEPENDENT_AMBULATORY_CARE_PROVIDER_SITE_OTHER): Payer: PPO | Admitting: Family Medicine

## 2020-05-27 ENCOUNTER — Ambulatory Visit
Admission: RE | Admit: 2020-05-27 | Discharge: 2020-05-27 | Disposition: A | Discharge status: Home / Self Care | Payer: PPO | Source: Home / Self Care | Attending: Family Medicine | Admitting: Family Medicine

## 2020-05-27 ENCOUNTER — Encounter: Payer: Self-pay | Admitting: Family Medicine

## 2020-05-27 ENCOUNTER — Other Ambulatory Visit: Payer: Self-pay | Admitting: Family Medicine

## 2020-05-27 ENCOUNTER — Other Ambulatory Visit: Payer: Self-pay

## 2020-05-27 VITALS — BP 130/73 | HR 105 | Temp 96.3°F | Wt 165.8 lb

## 2020-05-27 DIAGNOSIS — R059 Cough, unspecified: Secondary | ICD-10-CM

## 2020-05-27 DIAGNOSIS — J441 Chronic obstructive pulmonary disease with (acute) exacerbation: Secondary | ICD-10-CM | POA: Diagnosis not present

## 2020-05-27 MED ORDER — GUAIFENESIN-CODEINE 100-10 MG/5ML PO SYRP: 5.0000 mL | ORAL_SOLUTION | Freq: Three times a day (TID) | ORAL | 0 refills | Status: DC | PRN

## 2020-05-27 MED ORDER — PREDNISONE 10 MG PO TABS: ORAL_TABLET | ORAL | 0 refills | Status: AC

## 2020-05-27 MED ORDER — DOXYCYCLINE HYCLATE 100 MG PO TABS: 100.0000 mg | ORAL_TABLET | Freq: Two times a day (BID) | ORAL | 0 refills | Status: DC

## 2020-05-27 NOTE — Telephone Encounter (Signed)
Pt notified and appt scheduled.

## 2020-05-27 NOTE — Progress Notes (Unsigned)
cxr 

## 2020-05-27 NOTE — Assessment & Plan Note (Signed)
Hx of COPD from EMR history.  Discussed likely rebound coughing/worsening due to needing a longer course of oral steroids, typically 9-12 day taper in patients with COPD.  Discussed switching antibiotic from azithromycin to doxycycline for coverage for possible pneumonia based on his symptoms and will send in night time cough medicine to help with cough so he can rest over night.  Discussed if having worsening of symptoms or no improvement with current treatment plan to contact our office for follow up visit.  CXR ordered earlier today and awaiting over-read by radiology.  Plan: 1. Begin prednisone on taper over the next 9 days, as directed 2. Begin Robitussin AC 58mL every 8 hours as needed for cough, being sure not to drive or operate heavy machinery while on this medication, as it can be sedating 3. Begin doxycycline 100mg  BID x 10 days 4. Will contact once CXR over-read is returned from radiology 5. RTC PRN

## 2020-05-27 NOTE — Patient Instructions (Signed)
We will contact once we receive the xray over-read from the radiology department.  Begin prednisone 30mg  daily x 3 days, 20mg  daily x 3 days and 10mg  daily x 3 days.  Begin doxycycline antibiotic, 100mg , 1 tablet 2x per day for the next 10 days  Begin Robitussin AC, 36mL every 8 hours as needed for cough.  Be sure not to drive or operate heavy machinery while using this medication, as it can cause sedation.  We will plan to see you back if your symptoms worsen or fail to improve  You will receive a survey after today's visit either digitally by e-mail or paper by USPS mail. Your experiences and feedback matter to [place].  Please respond so we know how we are doing as we provide care for you.  Call us with any questions/concerns/needs.  It is my goal to be available to you for your health concerns.  Thanks for choosing me to be a partner in your healthcare needs!  Harlin Rain, FNP-C Family Nurse Practitioner [facility] Phone: [phone]

## 2020-05-27 NOTE — Progress Notes (Signed)
Virtual Visit via Telephone  The purpose of this virtual visit is to provide medical care while limiting exposure to the novel coronavirus (COVID19) for both patient and office staff.  Consent was obtained for phone visit:  Yes.   Answered questions that patient had about telehealth interaction:  Yes.   I discussed the limitations, risks, security and privacy concerns of performing an evaluation and management service by telephone. I also discussed with the patient that there may be a patient responsible charge related to this service. The patient expressed understanding and agreed to proceed.  Patient is at home and is accessed via telephone Services are provided by Harlin Rain, FNP-C from [facility])  ---------------------------------------------------------------------- Chief Complaint  Patient presents with  . Cough    persistent coughing, SOB w/ exertion x 3 weeks. Pt was seen by Dr. Raliegh Ip and diagnose with asthma w/ acute excerbation treated w/ predinosone, zpack and albuterol inhaler     S: Reviewed CMA documentation. I have called patient and gathered additional HPI as follows:  Isaac Weber presents for virtual telemedicine visit via telephone for concerns of persistent coughing, SOB with exertion x 3 weeks.  Reports he has completed the prednisone, is finishing up the azithromycin and has been taking the albuterol inhaler without improvement in his symptoms.  Denies fevers, sore throat, change in taste/smell, SOB at rest, CP, abdominal pain, n/v/d.  No change in cough from laying down or sitting up, does have some increased aggravation when laying down with cough and it is interfering with his sleep.  Patient is currently home Denies any high risk travel to areas of current concern for COVID19. Denies any known or suspected exposure to person with or possibly with COVID19.  Past Medical History:  Diagnosis Date  . Abnormal chest CT   . Allergic  rhinitis, unspecified   . Anemia   . Aortic atherosclerosis ([place])   . BPH (benign prostatic hyperplasia)   . COPD (chronic obstructive pulmonary disease) ([place])   . Coronary artery disease   . Cough variant asthma   . Glaucoma   . Hypertension   . Other chronic pain   . Pancreatic lesion   . Pure hypercholesterolemia, unspecified   . 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 Tx: [facility]  . Squamous cell carcinoma of skin 02/21/2020   Right neck proximal mandible. WD SCC, ulcerated. [facility] 04/29/2020   Social History   Tobacco Use  . Smoking status: Former Smoker    Packs/day: 3.00    Years: 26.00    Pack years: 78.00    Types: Cigarettes    Quit date: 09/17/1971    Years since quitting: 48.7  . Smokeless tobacco: Never Used  Vaping Use  . Vaping Use: Never used  Substance Use Topics  . Alcohol use: Yes    Alcohol/week: 2.0 standard drinks    Types: 2 Standard drinks or equivalent per week  . Drug use: No    Current Outpatient Medications:  .  albuterol (VENTOLIN HFA) 108 (90 Base) MCG/ACT inhaler, Inhale 1-2 puffs into the lungs every 4 (four) hours as needed for wheezing or shortness of breath (cough)., Disp: 1 each, Rfl: 2 .  amLODipine (NORVASC) 5 MG tablet, Take 5 mg by mouth daily., Disp: , Rfl:  .  Calcium Carb-Cholecalciferol (CALCIUM + D3 PO), Take by mouth., Disp: , Rfl:  .  diclofenac Sodium (VOLTAREN) 1 % GEL, Apply topically  4 (four) times daily., Disp: , Rfl:  .  ferrous sulfate 325 (65 FE) MG tablet, Take 325 mg by mouth daily with breakfast., Disp: , Rfl:  .  fluticasone (FLONASE) 50 MCG/ACT nasal spray, Place 1 spray into both nostrils daily., Disp: 16 g, Rfl: 4 .  FREESTYLE LITE test strip, , Disp: , Rfl:  .  Ginger, Zingiber officinalis, (GINGER PO), Take by mouth., Disp: , Rfl:  .  Glucosamine-Chondroitin (GLUCOSAMINE CHONDR COMPLEX PO), Take by mouth 2 (two) times daily. , Disp: , Rfl:  .   hydrochlorothiazide (HYDRODIURIL) 12.5 MG tablet, Take 1 tablet by mouth daily., Disp: , Rfl:  .  ibuprofen (ADVIL,MOTRIN) 200 MG tablet, Take 200-800 mg by mouth every 6 (six) hours as needed for fever, headache, mild pain, moderate pain or cramping. , Disp: , Rfl:  .  ketoconazole (NIZORAL) 2 % shampoo, APPLY SHAMPOO AND LATHER 3 TIMES WEEKLY. LEAVE ON FOR 5-8 MINUTES RINSE WELL, Disp: 120 mL, Rfl: 3 .  Lancets (FREESTYLE) lancets, Use to test your blood sugar once a day, Disp: , Rfl:  .  loratadine (CLARITIN) 10 MG tablet, Take 10 mg by mouth daily., Disp: , Rfl:  .  metFORMIN (GLUCOPHAGE-XR) 500 MG 24 hr tablet, Take 500 mg by mouth at bedtime., Disp: , Rfl:  .  Mometasone Furoate (ASMANEX HFA) 200 MCG/ACT AERO, Inhale 2 puffs into the lungs in the morning and at bedtime., Disp: 13 g, Rfl: 5 .  Multiple Vitamin (MULTIVITAMIN WITH MINERALS) TABS tablet, Take 1 tablet by mouth daily., Disp: , Rfl:  .  rosuvastatin (CRESTOR) 5 MG tablet, Take 5 mg by mouth daily. , Disp: , Rfl:  .  tamsulosin (FLOMAX) 0.4 MG CAPS capsule, Take 1 capsule by mouth daily., Disp: , Rfl:  .  doxycycline (VIBRA-TABS) 100 MG tablet, Take 1 tablet (100 mg total) by mouth 2 (two) times daily., Disp: 20 tablet, Rfl: 0 .  guaiFENesin-codeine (ROBITUSSIN AC) 100-10 MG/5ML syrup, Take 5 mLs by mouth 3 (three) times daily as needed for cough., Disp: 120 mL, Rfl: 0 .  losartan-hydrochlorothiazide (HYZAAR) 50-12.5 MG per tablet, Take 1 tablet by mouth daily. (Patient not taking: Reported on 05/27/2020), Disp: , Rfl:  .  predniSONE (DELTASONE) 10 MG tablet, Take 3 tablets (30 mg total) by mouth daily with breakfast for 3 days, THEN 2 tablets (20 mg total) daily with breakfast for 3 days, THEN 1 tablet (10 mg total) daily with breakfast for 3 days., Disp: 18 tablet, Rfl: 0  Depression screen [facility] 2/9 03/18/2020 01/11/2020  Decreased Interest 0 0  Down, Depressed, Hopeless 0 0  PHQ - 2 Score 0 0    No flowsheet data  found.  -------------------------------------------------------------------------- O: No physical exam performed due to remote telephone encounter.  Physical Exam: Patient remotely monitored without video.  Verbal communication appropriate.  Cognition normal.  No results found for this or any previous visit (from the past 2160 hour(s)).  -------------------------------------------------------------------------- A&P:  Problem List Items Addressed This Visit      Respiratory   COPD exacerbation ([place]) - Primary    Hx of COPD from EMR history.  Discussed likely rebound coughing/worsening due to needing a longer course of oral steroids, typically 9-12 day taper in patients with COPD.  Discussed switching antibiotic from azithromycin to doxycycline for coverage for possible pneumonia based on his symptoms and will send in night time cough medicine to help with cough so he can rest over night.  Discussed if having worsening of symptoms  or no improvement with current treatment plan to contact our office for follow up visit.  CXR ordered earlier today and awaiting over-read by radiology.  Plan: 1. Begin prednisone on taper over the next 9 days, as directed 2. Begin Robitussin AC 66mL every 8 hours as needed for cough, being sure not to drive or operate heavy machinery while on this medication, as it can be sedating 3. Begin doxycycline 100mg  BID x 10 days 4. Will contact once CXR over-read is returned from radiology 5. RTC PRN      Relevant Medications   doxycycline (VIBRA-TABS) 100 MG tablet   guaiFENesin-codeine (ROBITUSSIN AC) 100-10 MG/5ML syrup   predniSONE (DELTASONE) 10 MG tablet      Meds ordered this encounter  Medications  . doxycycline (VIBRA-TABS) 100 MG tablet    Sig: Take 1 tablet (100 mg total) by mouth 2 (two) times daily.    Dispense:  20 tablet    Refill:  0  . guaiFENesin-codeine (ROBITUSSIN AC) 100-10 MG/5ML syrup    Sig: Take 5 mLs by mouth 3 (three) times daily as  needed for cough.    Dispense:  120 mL    Refill:  0  . predniSONE (DELTASONE) 10 MG tablet    Sig: Take 3 tablets (30 mg total) by mouth daily with breakfast for 3 days, THEN 2 tablets (20 mg total) daily with breakfast for 3 days, THEN 1 tablet (10 mg total) daily with breakfast for 3 days.    Dispense:  18 tablet    Refill:  0    Follow-up: - Return if symptoms worsen or fail to improve with current treatment plan  Patient verbalizes understanding with the above medical recommendations including the limitation of remote medical advice.  Specific follow-up and call-back criteria were given for patient to follow-up or seek medical care more urgently if needed.  - Time spent in direct consultation with patient on phone: 5 minutes  Harlin Rain, [facility] 05/27/2020, 3:46 PM

## 2020-05-30 ENCOUNTER — Other Ambulatory Visit: Payer: Self-pay | Admitting: *Deleted

## 2020-05-30 DIAGNOSIS — D509 Iron deficiency anemia, unspecified: Secondary | ICD-10-CM

## 2020-05-30 DIAGNOSIS — Z125 Encounter for screening for malignant neoplasm of prostate: Secondary | ICD-10-CM

## 2020-05-30 DIAGNOSIS — Z0000 Encounter for general adult medical examination without abnormal findings: Secondary | ICD-10-CM

## 2020-05-30 DIAGNOSIS — E1169 Type 2 diabetes mellitus with other specified complication: Secondary | ICD-10-CM

## 2020-05-30 DIAGNOSIS — I10 Essential (primary) hypertension: Secondary | ICD-10-CM

## 2020-05-30 DIAGNOSIS — R351 Nocturia: Secondary | ICD-10-CM

## 2020-06-02 ENCOUNTER — Other Ambulatory Visit: Payer: PPO

## 2020-06-02 ENCOUNTER — Other Ambulatory Visit: Payer: Self-pay

## 2020-06-02 DIAGNOSIS — D509 Iron deficiency anemia, unspecified: Secondary | ICD-10-CM | POA: Diagnosis not present

## 2020-06-02 DIAGNOSIS — E1169 Type 2 diabetes mellitus with other specified complication: Secondary | ICD-10-CM | POA: Diagnosis not present

## 2020-06-02 DIAGNOSIS — Z0000 Encounter for general adult medical examination without abnormal findings: Secondary | ICD-10-CM | POA: Diagnosis not present

## 2020-06-02 DIAGNOSIS — Z125 Encounter for screening for malignant neoplasm of prostate: Secondary | ICD-10-CM | POA: Diagnosis not present

## 2020-06-02 DIAGNOSIS — I10 Essential (primary) hypertension: Secondary | ICD-10-CM | POA: Diagnosis not present

## 2020-06-02 DIAGNOSIS — E785 Hyperlipidemia, unspecified: Secondary | ICD-10-CM | POA: Diagnosis not present

## 2020-06-02 DIAGNOSIS — R351 Nocturia: Secondary | ICD-10-CM | POA: Diagnosis not present

## 2020-06-03 LAB — COMPLETE METABOLIC PANEL WITH GFR
AG Ratio: 1.5 (calc) (ref 1.0–2.5)
ALT: 18 U/L (ref 9–46)
AST: 15 U/L (ref 10–35)
Albumin: 3.8 g/dL (ref 3.6–5.1)
Alkaline phosphatase (APISO): 75 U/L (ref 35–144)
BUN: 21 mg/dL (ref 7–25)
CO2: 26 mmol/L (ref 20–32)
Calcium: 9.5 mg/dL (ref 8.6–10.3)
Chloride: 99 mmol/L (ref 98–110)
Creat: 0.79 mg/dL (ref 0.70–1.18)
GFR, Est African American: 102 mL/min/{1.73_m2} (ref >=60)
GFR, Est Non African American: 88 mL/min/{1.73_m2} (ref >=60)
Globulin: 2.6 g/dL (calc) (ref 1.9–3.7)
Glucose, Bld: 114 mg/dL — ABNORMAL HIGH (ref 65–99)
Potassium: 3.5 mmol/L (ref 3.5–5.3)
Sodium: 135 mmol/L (ref 135–146)
Total Bilirubin: 0.9 mg/dL (ref 0.2–1.2)
Total Protein: 6.4 g/dL (ref 6.1–8.1)

## 2020-06-03 LAB — LIPID PANEL
Cholesterol: 136 mg/dL (ref <200)
HDL: 69 mg/dL (ref >=40)
LDL Cholesterol (Calc): 51 mg/dL (calc)
Non-HDL Cholesterol (Calc): 67 mg/dL (calc) (ref <130)
Total CHOL/HDL Ratio: 2 (calc) (ref <5.0)
Triglycerides: 84 mg/dL (ref <150)

## 2020-06-03 LAB — HEMOGLOBIN A1C
Hgb A1c MFr Bld: 7 % of total Hgb — ABNORMAL HIGH (ref <5.7)
Mean Plasma Glucose: 154 (calc)
eAG (mmol/L): 8.5 (calc)

## 2020-06-03 LAB — CBC WITH DIFFERENTIAL/PLATELET
Absolute Monocytes: 958 cells/uL — ABNORMAL HIGH (ref 200–950)
Basophils Absolute: 57 cells/uL (ref 0–200)
Basophils Relative: 0.5 %
Eosinophils Absolute: 205 cells/uL (ref 15–500)
Eosinophils Relative: 1.8 %
HCT: 39.6 % (ref 38.5–50.0)
Hemoglobin: 13.8 g/dL (ref 13.2–17.1)
Lymphs Abs: 2565 cells/uL (ref 850–3900)
MCH: 31.4 pg (ref 27.0–33.0)
MCHC: 34.8 g/dL (ref 32.0–36.0)
MCV: 90.2 fL (ref 80.0–100.0)
MPV: 9.1 fL (ref 7.5–12.5)
Monocytes Relative: 8.4 %
Neutro Abs: 7615 cells/uL (ref 1500–7800)
Neutrophils Relative %: 66.8 %
Platelets: 194 10*3/uL (ref 140–400)
RBC: 4.39 10*6/uL (ref 4.20–5.80)
RDW: 12.7 % (ref 11.0–15.0)
Total Lymphocyte: 22.5 %
WBC: 11.4 10*3/uL — ABNORMAL HIGH (ref 3.8–10.8)

## 2020-06-03 LAB — PSA: PSA: 4.07 ng/mL — ABNORMAL HIGH (ref <=4.0)

## 2020-06-03 LAB — TSH: TSH: 1.51 mIU/L (ref 0.40–4.50)

## 2020-06-09 ENCOUNTER — Other Ambulatory Visit: Payer: Self-pay

## 2020-06-09 ENCOUNTER — Encounter: Payer: Self-pay | Admitting: Family Medicine

## 2020-06-09 ENCOUNTER — Ambulatory Visit (INDEPENDENT_AMBULATORY_CARE_PROVIDER_SITE_OTHER): Payer: PPO | Admitting: Family Medicine

## 2020-06-09 VITALS — BP 138/57 | HR 87 | Temp 97.8°F | Resp 16 | Ht 68.0 in | Wt 169.0 lb

## 2020-06-09 DIAGNOSIS — Z0000 Encounter for general adult medical examination without abnormal findings: Secondary | ICD-10-CM | POA: Diagnosis not present

## 2020-06-09 DIAGNOSIS — B351 Tinea unguium: Secondary | ICD-10-CM | POA: Diagnosis not present

## 2020-06-09 DIAGNOSIS — Z23 Encounter for immunization: Secondary | ICD-10-CM | POA: Diagnosis not present

## 2020-06-09 DIAGNOSIS — R351 Nocturia: Secondary | ICD-10-CM

## 2020-06-09 DIAGNOSIS — I10 Essential (primary) hypertension: Secondary | ICD-10-CM

## 2020-06-09 DIAGNOSIS — K862 Cyst of pancreas: Secondary | ICD-10-CM | POA: Diagnosis not present

## 2020-06-09 DIAGNOSIS — N401 Enlarged prostate with lower urinary tract symptoms: Secondary | ICD-10-CM | POA: Diagnosis not present

## 2020-06-09 MED ORDER — TERBINAFINE HCL 250 MG PO TABS: 250.0000 mg | ORAL_TABLET | Freq: Every day | ORAL | 2 refills | Status: DC

## 2020-06-09 NOTE — Progress Notes (Addendum)
Subjective:    Patient ID: Isaac Weber, male    DOB: 1945-08-02, 74 y.o.   MRN: [id number]  Isaac Weber is a 74 y.o. male presenting on 06/09/2020 for Annual Exam   HPI   Here for Annual Physical and Lab Review.  Upcoming Cardiology 11/18 and Pulmonology 11/22   CHRONIC DM, Type 2 Hyperlipidemia Reports no concerns, has been controlled Meds: Metformin XR 500mg  daily at bedtime Reports good compliance. Tolerating well w/o side-effects Currently on ARB, Statin Denies hypoglycemia, polyuria, visual changes, numbness or tingling.  Cysts, multiple internal  History of multiple internal cysts including liver, kidney etc Previous PCP managing this problem with surveillance Pancreatic cysts, previous MRI q 6 month per Dr Drema [place] Compelted MR Abdomen 04/2020 now due for repeat in 6 months to monitor stability  CHRONIC HTN: Reports normally avg 120s, had higher reading here. He gets BP checked every 8 weeks with donating blood. Current Meds - Losartan-HCTZ 50-12.5mg  daily, Amlodipine 5mg  daily Reports good compliance, took meds today. Tolerating well, w/o complaints. Denies CP, dyspnea, HA, edema, dizziness / lightheadedness  History of Iron Deficiency Donates blood every 8 weeks. He takes iron supplement.  Osteoarthritis, bilateral knees, lumbar spine Chronic problem, multiple joints, episodic pain and flares He has has seen chiropractor as well. He said he tried Relief Factor, for 7 weeks limited effect. - Uses Voltaren topical PRN with good results - Not effective Tylenol - Motrin 200mg  x 4 night before occasional He is able to play golf weekly and is very active  BPH LUTS s/p procedure, laser 2010 improvement - he has some BPH LUTS, some frequency, some urgency, nocturia 3 x nightly - taking Tamsulosin 0.4mg  daily, and may try 2 eventually - Previous [employer], now no longer goes, will have DRE regularly and PSA  Additional updates  Asthma  flare recently 05/19/20 has improved and cough nearly resolved.  Health Maintenance:  COVID19 vaccine - unsure about taking it. Asking about Hydroxychloroquine He has elevated Vitamin D He is going to take Zinc supplement. And switch calcium.  Future reconsider 2nd dose PNA Vaccine Pneumovax23 now after age 37.  Due for Flu Shot, will receive today    Depression screen [facility] 2/9 06/09/2020 03/18/2020 01/11/2020  Decreased Interest 0 0 0  Down, Depressed, Hopeless 0 0 0  PHQ - 2 Score 0 0 0    Past Medical History:  Diagnosis Date  . Abnormal chest CT   . Allergic rhinitis, unspecified   . Anemia   . Aortic atherosclerosis ([place])   . BPH (benign prostatic hyperplasia)   . COPD (chronic obstructive pulmonary disease) ([place])   . Coronary artery disease   . Cough variant asthma   . Glaucoma   . Hypertension   . Other chronic pain   . Pancreatic lesion   . Pure hypercholesterolemia, unspecified   . 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 Tx: EDC  . Squamous cell carcinoma of skin 02/21/2020   Right neck proximal mandible. WD SCC, ulcerated. [facility] 04/29/2020   Past Surgical History:  Procedure Laterality Date  . BUNIONECTOMY Left 2003   hammer toe as well  . BUNIONECTOMY WITH HAMMERTOE RECONSTRUCTION Left 2013   repeat  . CERVICAL DISC SURGERY    . GREEN LIGHT LASER TURP (TRANSURETHRAL RESECTION OF PROSTATE  2010   laser, shrink prostate  . KNEE ARTHROSCOPY Right 1991  . LEG SURGERY Left    BENIGN  BONE TUMOR  . LUMBAR [place] SURGERY  2010   discectomy  . [place] SURGERY  2011  . PROSTATE SURGERY  2002   shrink prostate  . ROTATOR CUFF REPAIR Left 1999   debride, remove bonespur  . ROTATOR CUFF REPAIR Right 1996   Social History   Socioeconomic History  . Marital status: Married    Spouse name: Not on file  . Number of children: Not on file  . Years of education: high school  . Highest education level:  High school graduate  Occupational History  . Occupation: retired  Tobacco Use  . Smoking status: Former Smoker    Packs/day: 3.00    Years: 26.00    Pack years: 78.00    Types: Cigarettes    Quit date: 09/17/1971    Years since quitting: 48.7  . Smokeless tobacco: Never Used  Vaping Use  . Vaping Use: Never used  Substance and Sexual Activity  . Alcohol use: Yes    Alcohol/week: 2.0 standard drinks    Types: 2 Standard drinks or equivalent per week  . Drug use: No  . Sexual activity: Not on file  Other Topics Concern  . Not on file  Social History Narrative  . Not on file   Social Determinants of Health   Financial Resource Strain: Low Risk   . Difficulty of Paying Living Expenses: Not hard at all  Food Insecurity: No Food Insecurity  . Worried About Charity fundraiser in the Last Year: Never true  . Ran Out of Food in the Last Year: Never true  Transportation Needs: No Transportation Needs  . Lack of Transportation (Medical): No  . Lack of Transportation (Non-Medical): No  Physical Activity: Insufficiently Active  . Days of Exercise per Week: 7 days  . Minutes of Exercise per Session: 10 min  Stress: No Stress Concern Present  . Feeling of Stress : Not at all  Social Connections:   . Frequency of Communication with Friends and Family: Not on file  . Frequency of Social Gatherings with Friends and Family: Not on file  . Attends Religious Services: Not on file  . Active Member of Clubs or Organizations: Not on file  . Attends Archivist Meetings: Not on file  . Marital Status: Not on file  Intimate Partner Violence:   . Fear of Current or Ex-Partner: Not on file  . Emotionally Abused: Not on file  . Physically Abused: Not on file  . Sexually Abused: Not on file   Family History  Problem Relation Age of Onset  . Heart disease Mother 20  . Emphysema Father 46  . Heart disease Brother 48   Current Outpatient Medications on File Prior to Visit   Medication Sig  . albuterol (VENTOLIN HFA) 108 (90 Base) MCG/ACT inhaler Inhale 1-2 puffs into the lungs every 4 (four) hours as needed for wheezing or shortness of breath (cough).  Marland Kitchen amLODipine (NORVASC) 5 MG tablet Take 5 mg by mouth daily.  . Calcium Carb-Cholecalciferol (CALCIUM + D3 PO) Take by mouth.  . diclofenac Sodium (VOLTAREN) 1 % GEL Apply topically 4 (four) times daily.  . ferrous sulfate 325 (65 FE) MG tablet Take 325 mg by mouth daily with breakfast.  . fluticasone (FLONASE) 50 MCG/ACT nasal spray Place 1 spray into both nostrils daily.  Marland Kitchen FREESTYLE LITE test strip   . Ginger, Zingiber officinalis, (GINGER PO) Take by mouth.  . Glucosamine-Chondroitin (GLUCOSAMINE CHONDR COMPLEX PO) Take by mouth 2 (  two) times daily.   . hydrochlorothiazide (HYDRODIURIL) 12.5 MG tablet Take 1 tablet by mouth daily.  Marland Kitchen ibuprofen (ADVIL,MOTRIN) 200 MG tablet Take 200-800 mg by mouth every 6 (six) hours as needed for fever, headache, mild pain, moderate pain or cramping.   Marland Kitchen ketoconazole (NIZORAL) 2 % shampoo APPLY SHAMPOO AND LATHER 3 TIMES WEEKLY. LEAVE ON FOR 5-8 MINUTES RINSE WELL  . Lancets (FREESTYLE) lancets Use to test your blood sugar once a day  . loratadine (CLARITIN) 10 MG tablet Take 10 mg by mouth daily.  . metFORMIN (GLUCOPHAGE-XR) 500 MG 24 hr tablet Take 500 mg by mouth at bedtime.  . Mometasone Furoate (ASMANEX HFA) 200 MCG/ACT AERO Inhale 2 puffs into the lungs in the morning and at bedtime.  . Multiple Vitamin (MULTIVITAMIN WITH MINERALS) TABS tablet Take 1 tablet by mouth daily.  . rosuvastatin (CRESTOR) 5 MG tablet Take 5 mg by mouth daily.   . tamsulosin (FLOMAX) 0.4 MG CAPS capsule Take 1 capsule by mouth daily.  Marland Kitchen losartan (COZAAR) 50 MG tablet Take 1 tablet (50 mg total) by mouth daily.   No current facility-administered medications on file prior to visit.    Review of Systems  Constitutional: Negative for activity change, appetite change, chills, diaphoresis, fatigue  and fever.  HENT: Negative for congestion and hearing loss.   Eyes: Negative for visual disturbance.  Respiratory: Negative for cough, chest tightness, shortness of breath and wheezing.   Cardiovascular: Negative for chest pain, palpitations and leg swelling.  Gastrointestinal: Negative for abdominal pain, constipation, diarrhea, nausea and vomiting.  Genitourinary: Negative for dysuria, frequency and hematuria.  Musculoskeletal: Negative for arthralgias and neck pain.  Skin: Negative for rash.  Allergic/Immunologic: Negative for environmental allergies.  Neurological: Negative for dizziness, weakness, light-headedness, numbness and headaches.  Hematological: Negative for adenopathy.  Psychiatric/Behavioral: Negative for behavioral problems, dysphoric mood and sleep disturbance.   Per HPI unless specifically indicated above     Objective:    BP (!) 138/57   Pulse 87   Temp 97.8 F (36.6 C) (Temporal)   Resp 16   Ht 5\' 8"  (1.727 m)   Wt 169 lb (76.7 kg)   SpO2 99%   BMI 25.70 kg/m   Wt Readings from Last 3 Encounters:  06/09/20 169 lb (76.7 kg)  05/27/20 165 lb 12.8 oz (75.2 kg)  05/19/20 162 lb (73.5 kg)    Physical Exam Vitals and nursing note reviewed.  Constitutional:      General: He is not in acute distress.    Appearance: He is well-developed. He is not diaphoretic.     Comments: Well-appearing, comfortable, cooperative  HENT:     Head: Normocephalic and atraumatic.  Eyes:     General:        Right eye: No discharge.        Left eye: No discharge.     Conjunctiva/sclera: Conjunctivae normal.     Pupils: Pupils are equal, round, and reactive to light.  Neck:     Thyroid: No thyromegaly.     Vascular: No carotid bruit.  Cardiovascular:     Rate and Rhythm: Normal rate and regular rhythm.     Heart sounds: Normal heart sounds. No murmur heard.   Pulmonary:     Effort: Pulmonary effort is normal. No respiratory distress.     Breath sounds: Normal breath  sounds. No wheezing or rales.  Abdominal:     General: Bowel sounds are normal. There is no distension.  Palpations: Abdomen is soft. There is no mass.     Tenderness: There is no abdominal tenderness.  Musculoskeletal:        General: No tenderness. Normal range of motion.     Cervical back: Normal range of motion and neck supple.     Right lower leg: No edema.     Left lower leg: No edema.     Comments: Upper / Lower Extremities: - Normal muscle tone, strength bilateral upper extremities 5/5, lower extremities 5/5  Lymphadenopathy:     Cervical: No cervical adenopathy.  Skin:    General: Skin is warm and dry.     Findings: No erythema or rash.     Comments: Thickening discoloration of L great toe  Neurological:     Mental Status: He is alert and oriented to person, place, and time.     Comments: Distal sensation intact to light touch all extremities  Psychiatric:        Behavior: Behavior normal.     Comments: Well groomed, good eye contact, normal speech and thoughts      I have personally reviewed the radiology report from 05/16/20 MR Abdomen.   Narrative & Impression  CLINICAL DATA:  Follow-up pancreatic pseudocyst  EXAM: MRI ABDOMEN WITHOUT AND WITH CONTRAST  TECHNIQUE: Multiplanar multisequence MR imaging of the abdomen was performed both before and after the administration of intravenous contrast.  CONTRAST:  50mL GADAVIST GADOBUTROL 1 MMOL/ML IV SOLN  COMPARISON:  11/13/2019, 04/24/2019  FINDINGS: Lower chest: Small focus of dependent atelectasis or consolidation of the right lung base (series 22, image 21).  Hepatobiliary: Occasional cysts of the liver. Unchanged flash filling hemangiomata. No mass or other parenchymal abnormality identified. Small gallstone and sludge in the gallbladder. No biliary ductal dilatation.  Pancreas: Fatty atrophy of the pancreatic parenchyma. Multiple small cystic lesions of the pancreatic parenchyma are again  noted, the largest in the tail measuring 1.7 x 1.5 cm. No associated contrast enhancement. No pancreatic ductal dilatation. No mass, inflammatory changes, or other parenchymal abnormality identified.  Spleen: Within normal limits in size and appearance. Incidental simple cyst of the anterior spleen.  Adrenals/Urinary Tract: No masses identified. Exophytic cortical and parapelvic cysts of the kidneys. No evidence of hydronephrosis.  Stomach/Bowel: Visualized portions within the abdomen are unremarkable.  Vascular/Lymphatic: No pathologically enlarged lymph nodes identified. No abdominal aortic aneurysm demonstrated.  Other:  None.  Musculoskeletal: No suspicious bone lesions identified.  IMPRESSION: 1. Multiple small cystic lesions of the pancreatic parenchyma, the largest in the tail measuring 1.7 x 1.5 cm. No associated contrast enhancement. These remain most consistent with small IPMNs. Recommend additional six-month contrast enhanced MR follow-up to assess 2 years of initial stability. This recommendation follows ACR consensus guidelines: Management of Incidental Pancreatic Cysts: A White Paper of the ACR Incidental Findings Committee. J am Coll Radiol 2017;14:911-923. 2. Small focus of dependent atelectasis or consolidation of the right lung base. Consider follow-up radiographs if there is clinical concern for infection or aspiration. 3. Small gallstone and sludge in the gallbladder. No biliary ductal dilatation.   Electronically Signed   By: Eddie Candle M.D.   On: 05/16/2020 16:49     Results for orders placed or performed in visit on 05/30/20  TSH  Result Value Ref Range   TSH 1.51 0.40 - 4.50 mIU/L  PSA  Result Value Ref Range   PSA 4.07 (H) < OR = 4.0 ng/mL  Lipid panel  Result Value Ref Range   Cholesterol 136 <  200 mg/dL   HDL 69 > OR = 40 mg/dL   Triglycerides 84 <150 mg/dL   LDL Cholesterol (Calc) 51 mg/dL (calc)   Total CHOL/HDL Ratio 2.0  <5.0 (calc)   Non-HDL Cholesterol (Calc) 67 <130 mg/dL (calc)  COMPLETE METABOLIC PANEL WITH GFR  Result Value Ref Range   Glucose, Bld 114 (H) 65 - 99 mg/dL   BUN 21 7 - 25 mg/dL   Creat 0.79 0.70 - 1.18 mg/dL   GFR, Est Non African American 88 > OR = 60 mL/min/1.70m2   GFR, Est African American 102 > OR = 60 mL/min/1.42m2   BUN/Creatinine Ratio NOT APPLICABLE 6 - 22 (calc)   Sodium 135 135 - 146 mmol/L   Potassium 3.5 3.5 - 5.3 mmol/L   Chloride 99 98 - 110 mmol/L   CO2 26 20 - 32 mmol/L   Calcium 9.5 8.6 - 10.3 mg/dL   Total Protein 6.4 6.1 - 8.1 g/dL   Albumin 3.8 3.6 - 5.1 g/dL   Globulin 2.6 1.9 - 3.7 g/dL (calc)   AG Ratio 1.5 1.0 - 2.5 (calc)   Total Bilirubin 0.9 0.2 - 1.2 mg/dL   Alkaline phosphatase (APISO) 75 35 - 144 U/L   AST 15 10 - 35 U/L   ALT 18 9 - 46 U/L  CBC with Differential/Platelet  Result Value Ref Range   WBC 11.4 (H) 3.8 - 10.8 Thousand/uL   RBC 4.39 4.20 - 5.80 Million/uL   Hemoglobin 13.8 13.2 - 17.1 g/dL   HCT 39.6 38 - 50 %   MCV 90.2 80.0 - 100.0 fL   MCH 31.4 27.0 - 33.0 pg   MCHC 34.8 32.0 - 36.0 g/dL   RDW 12.7 11.0 - 15.0 %   Platelets 194 140 - 400 Thousand/uL   MPV 9.1 7.5 - 12.5 fL   Neutro Abs 7,615 1,500 - 7,800 cells/uL   Lymphs Abs 2,565 850 - 3,900 cells/uL   Absolute Monocytes 958 (H) 200 - 950 cells/uL   Eosinophils Absolute 205 15.0 - 500.0 cells/uL   Basophils Absolute 57 0.0 - 200.0 cells/uL   Neutrophils Relative % 66.8 %   Total Lymphocyte 22.5 %   Monocytes Relative 8.4 %   Eosinophils Relative 1.8 %   Basophils Relative 0.5 %  Hemoglobin A1c  Result Value Ref Range   Hgb A1c MFr Bld 7.0 (H) <5.7 % of total Hgb   Mean Plasma Glucose 154 (calc)   eAG (mmol/L) 8.5 (calc)      Assessment & Plan:   Problem List Items Addressed This Visit    Essential hypertension    Controlled - Home BP readings reviewed, normal at home, elevated here in office usually  No known complications     Plan:  1. Continue current  BP regimen - Losartan-HCTZ 50-12.5mg  daily, Amlodipine 5mg  daily 2. Encourage improved lifestyle - low sodium diet, regular exercise 3. Continue monitor BP outside office, bring readings to next visit, if persistently >140/90 or new symptoms notify office sooner      Relevant Medications   losartan (COZAAR) 50 MG tablet   BPH associated with nocturia    BPH mostly controlled on Tamsulosin S/p procedural laser for BPH 2010 Elevated PSA 3-4 range now, will repeat in 6 months, no longer followed by Urology consider return        Other Visit Diagnoses    Annual physical exam    -  Primary   Needs flu shot  Relevant Orders   Flu Vaccine QUAD High Dose(Fluad) (Completed)   Onychomycosis of left great toe       Relevant Medications   terbinafine (LAMISIL) 250 MG tablet   Cyst of pancreas          Updated Health Maintenance information - High dose Flu Shot today Reviewed recent lab results with patient Encouraged improvement to lifestyle with diet and exercise - Goal of weight loss  #Pancreatic Cyst MRI Abdomen shows stable cyst for past 6 month, now repeat in 6 more month will be 2 years, notify our office when ready and we can order MR imaging.  #Asthma - resolved, lungs clear and doing well #onychomycosis R great toenail resolved now recurrence L side unresolved, repeat trial Terbinafine   Recent Labs    06/02/20 0852  HGBA1C 7.0*   Type 2 Diabetes Controlled on Metformin XR 500mg  daily  Bilateral hammertoe deformity  Plan - Proceed with ordering Diabetic Shoes, completed form today - sign off on Statement for Certifying Physican for Therapeutic Shoes Updated on 08/13/20 - Patient would benefit from Diabetic Shoes due to foot deformity, diabetes control is improving on current regimen, and I am continuing to monitor and manage diabetes.   Meds ordered this encounter  Medications  . terbinafine (LAMISIL) 250 MG tablet    Sig: Take 1 tablet (250 mg total) by  mouth daily.    Dispense:  30 tablet    Refill:  2      Follow up plan: Return in about 6 months (around 12/07/2020) for 6 month follow-up MR results abdomen, PSA elevated, Diabetes.   Future orders 11/2020 for A1c and PSA  He will notify office when ready for repeat MRI Abdomen for pancreatic cyst follow-up 6 month   Nobie Putnam, DO [facility] 06/09/2020, 1:59 PM

## 2020-06-09 NOTE — Patient Instructions (Addendum)
Thank you for coming to the office today.  Lungs sound great today! Keep up the good work and good luck hunting, you are cleared to participate.  Start Terbinafine 250mg  daily as planned 30 pills +2 refill for toenail  PSA mild elevated, will repeat in 6 months  A1c sugar can repeat in 6 months, likely prednisone raised it.  MRI - Abdomen in 6 months as well, message me when ready to pursue this we can get it covered and order again.  DUE for FASTING BLOOD WORK (no food or drink after midnight before the lab appointment, only water or coffee without cream/sugar on the morning of)  SCHEDULE "Lab Only" visit in the morning at the clinic for lab draw in 6 MONTHS   - Make sure Lab Only appointment is at about 1 week before your next appointment, so that results will be available  For Lab Results, once available within 2-3 days of blood draw, you can can log in to MyChart online to view your results and a brief explanation. Also, we can discuss results at next follow-up visit.   Please schedule a Follow-up Appointment to: Return in about 6 months (around 12/07/2020) for 6 month follow-up MR results abdomen, PSA elevated, Diabetes.  If you have any other questions or concerns, please feel free to call the office or send a message through [place]. You may also schedule an earlier appointment if necessary.  Additionally, you may be receiving a survey about your experience at our office within a few days to 1 week by e-mail or mail. We value your feedback.  Nobie Putnam, DO [facility]

## 2020-06-10 ENCOUNTER — Other Ambulatory Visit: Payer: Self-pay | Admitting: Family Medicine

## 2020-06-10 DIAGNOSIS — E1169 Type 2 diabetes mellitus with other specified complication: Secondary | ICD-10-CM

## 2020-06-10 DIAGNOSIS — R972 Elevated prostate specific antigen [PSA]: Secondary | ICD-10-CM

## 2020-06-10 DIAGNOSIS — R351 Nocturia: Secondary | ICD-10-CM

## 2020-06-10 DIAGNOSIS — N401 Enlarged prostate with lower urinary tract symptoms: Secondary | ICD-10-CM

## 2020-06-10 NOTE — Progress Notes (Signed)
Cardiology Office Note   Date:  06/12/2020   ID:  Isaac Weber, DOB [birth year], MRN [id number]  PCP:  Olin Hauser, DO    No chief complaint on file.  Coronary calcification  Wt Readings from Last 3 Encounters:  06/12/20 171 lb 9.6 oz (77.8 kg)  06/09/20 169 lb (76.7 kg)  05/27/20 165 lb 12.8 oz (75.2 kg)       History of Present Illness: Isaac Weber is a 74 y.o. male  who is being seen today for the evaluation of coronary calcificationat the request of Leighton Ruff, MD.  Chest CT scan in 02/2019 showed: IMPRESSION: 1. No evidence of interstitial lung disease. 2. Mild paraseptal emphysema, mild diffuse bronchial wall thickening and mild saber sheath trachea configuration, suggesting COPD. 3. Solitary left upper lobe 4 mm solid pulmonary nodule. No follow-up needed if patient is low-risk. Non-contrast chest CT can be considered in 12 months if patient is high-risk. This recommendation follows the consensus statement: Guidelines for Management of Incidental Pulmonary Nodules Detected on CT Images:From the Fleischner Society 2017; published online before print (10.1148/radiol.[id number]). 4. 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 5. Two vessel coronary atherosclerosis. Left anterior descending and left circumflex coronary atherosclerosis.  Plan in 03/2019 was :"Coronary calcification:No angina. COntinue aggressive secondary prevention. LDL 69. TG 52. He will let us know about any exertional chest pain or change in exercise tolerance. 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 These measure up to 1.6 cm  and appear unilocular without internal enhancement. There appearance is nonspecific but favors of benign etiology. According to consensus criteria follow-up imaging is indicated. Repeat without and with contrast MRI of the pancreas in 6 months is recommended. "  Since the last visit, he had a mild case of pneumonia.  He has had his COVID vaccines before a trip to [place].     Denies : Chest pain. Dizziness. Leg edema. Nitroglycerin use. Orthopnea. Palpitations. Paroxysmal nocturnal dyspnea. Shortness of breath. Syncope.   Plays golf.  Back to walking regularly. Also hunts.    Past Medical History:  Diagnosis Date  . Abnormal chest CT   . Allergic rhinitis, unspecified   . Anemia   . Aortic atherosclerosis ([place])   . BPH (benign prostatic hyperplasia)   . COPD (chronic obstructive pulmonary disease) ([place])   . Coronary artery disease   . Cough variant asthma   . Glaucoma   . Hypertension   . Other chronic pain   . Pancreatic lesion   . Pure hypercholesterolemia, unspecified   . 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 Tx: EDC  . Squamous cell carcinoma of skin 02/21/2020   Right neck proximal mandible. WD SCC, ulcerated. [facility] 04/29/2020    Past Surgical History:  Procedure Laterality Date  . BUNIONECTOMY Left 2003   hammer toe as well  . BUNIONECTOMY WITH HAMMERTOE RECONSTRUCTION Left 2013   repeat  . CERVICAL DISC SURGERY    . GREEN LIGHT LASER TURP (TRANSURETHRAL RESECTION OF PROSTATE  2010   laser, shrink prostate  . KNEE ARTHROSCOPY Right 1991  . LEG SURGERY Left    BENIGN BONE TUMOR  .  LUMBAR DISC SURGERY  2010   discectomy  . [place] SURGERY  2011  . PROSTATE SURGERY  2002   shrink prostate  . ROTATOR CUFF REPAIR Left 1999   debride, remove bonespur  . ROTATOR CUFF REPAIR Right 1996     Current Outpatient Medications  Medication Sig Dispense Refill  . albuterol (VENTOLIN HFA) 108 (90 Base)  MCG/ACT inhaler Inhale 1-2 puffs into the lungs every 4 (four) hours as needed for wheezing or shortness of breath (cough). 1 each 2  . amLODipine (NORVASC) 5 MG tablet Take 5 mg by mouth daily.    . Calcium Carb-Cholecalciferol (CALCIUM + D3 PO) Take by mouth.    . diclofenac Sodium (VOLTAREN) 1 % GEL Apply topically 4 (four) times daily.    . ferrous sulfate 325 (65 FE) MG tablet Take 325 mg by mouth daily with breakfast.    . fluticasone (FLONASE) 50 MCG/ACT nasal spray Place 1 spray into both nostrils daily. 16 g 4  . FREESTYLE LITE test strip     . Ginger, Zingiber officinalis, (GINGER PO) Take by mouth.    . Glucosamine-Chondroitin (GLUCOSAMINE CHONDR COMPLEX PO) Take by mouth 2 (two) times daily.     . hydrochlorothiazide (HYDRODIURIL) 12.5 MG tablet Take 1 tablet by mouth daily.    Marland Kitchen ibuprofen (ADVIL,MOTRIN) 200 MG tablet Take 200-800 mg by mouth every 6 (six) hours as needed for fever, headache, mild pain, moderate pain or cramping.     Marland Kitchen ketoconazole (NIZORAL) 2 % shampoo APPLY SHAMPOO AND LATHER 3 TIMES WEEKLY. LEAVE ON FOR 5-8 MINUTES RINSE WELL 120 mL 3  . Lancets (FREESTYLE) lancets Use to test your blood sugar once a day    . loratadine (CLARITIN) 10 MG tablet Take 10 mg by mouth daily.    Marland Kitchen losartan (COZAAR) 50 MG tablet Take 1 tablet (50 mg total) by mouth daily. 90 tablet 3  . metFORMIN (GLUCOPHAGE-XR) 500 MG 24 hr tablet Take 500 mg by mouth at bedtime.    . Mometasone Furoate (ASMANEX HFA) 200 MCG/ACT AERO Inhale 2 puffs into the lungs in the morning and at bedtime. 13 g 5  . Multiple Vitamin (MULTIVITAMIN WITH MINERALS) TABS tablet Take 1 tablet by mouth daily.    . rosuvastatin (CRESTOR) 5 MG tablet Take 5 mg by mouth daily.     . tamsulosin (FLOMAX) 0.4 MG CAPS capsule Take 1 capsule by mouth daily.    Marland Kitchen terbinafine (LAMISIL) 250 MG tablet Take 1 tablet (250 mg total) by mouth daily. 30 tablet 2   No current facility-administered medications for this visit.     Allergies:   Ciprofloxacin, Duloxetine, and Morphine and related    Social History:  The patient  reports that he quit smoking about 48 years ago. His smoking use included cigarettes. He has a 78.00 pack-year smoking history. He has never used smokeless tobacco. He reports current alcohol use of about 2.0 standard drinks of alcohol per week. He reports that he does not use drugs.   Family History:  The patient's family history includes Emphysema (age of onset: 3) in his father; Heart disease (age of onset: 52) in his mother; Heart disease (age of onset: 80) in his brother.    ROS:  Please see the history of present illness.   Otherwise, review of systems are positive for joint pains.   All other systems are reviewed and negative.    PHYSICAL EXAM: VS:  BP 130/66   Pulse 75  Ht 5\' 8"  (1.727 m)   Wt 171 lb 9.6 oz (77.8 kg)   SpO2 98%   BMI 26.09 kg/m  , BMI Body mass index is 26.09 kg/m. GEN: Well nourished, well developed, in no acute distress  HEENT: normal  Neck: no JVD, carotid bruits, or masses Cardiac: RRR; no murmurs, rubs, or gallops,no edema  Respiratory:  clear to auscultation bilaterally, normal work of breathing GI: soft, nontender, nondistended, + BS MS: no deformity or atrophy  Skin: warm and dry, no rash Neuro:  Strength and sensation are intact Psych: euthymic mood, full affect   EKG:   The ekg ordered today demonstrates NSR, no ST changes   Recent Labs: 06/02/2020: ALT 18; BUN 21; Creat 0.79; Hemoglobin 13.8; Platelets 194; Potassium 3.5; Sodium 135; TSH 1.51   Lipid Panel    Component Value Date/Time   CHOL 136 06/02/2020 0852   TRIG 84 06/02/2020 0852   HDL 69 06/02/2020 0852   CHOLHDL 2.0 06/02/2020 0852   LDLCALC 51 06/02/2020 0852     Other studies Reviewed: Additional studies/ records that were reviewed today with results demonstrating: labs reviewed.   ASSESSMENT AND PLAN:  1. Coronary calcification: No angina.  Continue preventive  therapy.  Uses some NSAIDs.  We spoke about CV risks with NSAIDs. Has some Voltaren gel.  2. HTN: The current medical regimen is effective;  continue present plan and medications. 3. DM: A1C 7.0.  Whole food plant based diet.  4. Hyperlipidemia: The current medical regimen is effective;  continue present plan and medications.  COntinue low dose rosuvastatin.   Current medicines are reviewed at length with the patient today.  The patient concerns regarding his medicines were addressed.  The following changes have been made:  No change  Labs/ tests ordered today include:  No orders of the defined types were placed in this encounter.   Recommend 150 minutes/week of aerobic exercise Low fat, low carb, high fiber diet recommended  Disposition:   FU in 1 year   Signed, Larae Grooms, MD  06/12/2020 9:30 AM    [address], [place], [place]  [address] Phone: [phone]; Fax: [phone]

## 2020-06-10 NOTE — Assessment & Plan Note (Signed)
Controlled - Home BP readings reviewed, normal at home, elevated here in office usually  No known complications     Plan:  1. Continue current BP regimen - Losartan-HCTZ 50-12.5mg  daily, Amlodipine 5mg  daily 2. Encourage improved lifestyle - low sodium diet, regular exercise 3. Continue monitor BP outside office, bring readings to next visit, if persistently >140/90 or new symptoms notify office sooner

## 2020-06-10 NOTE — Assessment & Plan Note (Signed)
BPH mostly controlled on Tamsulosin S/p procedural laser for BPH 2010 Elevated PSA 3-4 range now, will repeat in 6 months, no longer followed by Urology consider return

## 2020-06-12 ENCOUNTER — Other Ambulatory Visit: Payer: Self-pay

## 2020-06-12 ENCOUNTER — Encounter: Payer: Self-pay | Admitting: Interventional Cardiology

## 2020-06-12 ENCOUNTER — Ambulatory Visit: Payer: PPO | Admitting: Interventional Cardiology

## 2020-06-12 VITALS — BP 130/66 | HR 75 | Ht 68.0 in | Wt 171.6 lb

## 2020-06-12 DIAGNOSIS — E119 Type 2 diabetes mellitus without complications: Secondary | ICD-10-CM | POA: Diagnosis not present

## 2020-06-12 DIAGNOSIS — I2510 Atherosclerotic heart disease of native coronary artery without angina pectoris: Secondary | ICD-10-CM

## 2020-06-12 DIAGNOSIS — I10 Essential (primary) hypertension: Secondary | ICD-10-CM | POA: Diagnosis not present

## 2020-06-12 DIAGNOSIS — E782 Mixed hyperlipidemia: Secondary | ICD-10-CM | POA: Diagnosis not present

## 2020-06-12 NOTE — Patient Instructions (Signed)
Medication Instructions:  Your physician recommends that you continue on your current medications as directed. Please refer to the Current Medication list given to you today.  *If you need a refill on your cardiac medications before your next appointment, please call your pharmacy*   Lab Work: None  If you have labs (blood work) drawn today and your tests are completely normal, you will receive your results only by: . MyChart Message (if you have MyChart) OR . A paper copy in the mail If you have any lab test that is abnormal or we need to change your treatment, we will call you to review the results.   Testing/Procedures: None  Follow-Up: At [facility], you and your health needs are our priority.  As part of our continuing mission to provide you with exceptional heart care, we have created designated Provider Care Teams.  These Care Teams include your primary Cardiologist (physician) and Advanced Practice Providers (APPs -  Physician Assistants and Nurse Practitioners) who all work together to provide you with the care you need, when you need it.  We recommend signing up for the patient portal called "MyChart".  Sign up information is provided on this After Visit Summary.  MyChart is used to connect with patients for Virtual Visits (Telemedicine).  Patients are able to view lab/test results, encounter notes, upcoming appointments, etc.  Non-urgent messages can be sent to your provider as well.   To learn more about what you can do with MyChart, go to [email or web address].    Your next appointment:   12 month(s)  The format for your next appointment:   In Person  Provider:   You may see Jayadeep Varanasi, MD or one of the following Advanced Practice Providers on your designated Care Team:    Dayna Dunn, PA-C  Michele Lenze, PA-C    Other Instructions  High-Fiber Diet Fiber, also called dietary fiber, is a type of carbohydrate that is found in fruits, vegetables, whole  grains, and beans. A high-fiber diet can have many health benefits. Your health care provider may recommend a high-fiber diet to help:  Prevent constipation. Fiber can make your bowel movements more regular.  Lower your cholesterol.  Relieve the following conditions: ? Swelling of veins in the anus (hemorrhoids). ? Swelling and irritation (inflammation) of specific areas of the digestive tract (uncomplicated diverticulosis). ? A problem of the large intestine (colon) that sometimes causes pain and diarrhea (irritable bowel syndrome, IBS).  Prevent overeating as part of a weight-loss plan.  Prevent heart disease, type 2 diabetes, and certain cancers. What is my plan? The recommended daily fiber intake in grams (g) includes:  38 g for men age 50 or younger.  30 g for men over age 50.  25 g for women age 50 or younger.  21 g for women over age 50. You can get the recommended daily intake of dietary fiber by:  Eating a variety of fruits, vegetables, grains, and beans.  Taking a fiber supplement, if it is not possible to get enough fiber through your diet. What do I need to know about a high-fiber diet?  It is better to get fiber through food sources rather than from fiber supplements. There is not a lot of research about how effective supplements are.  Always check the fiber content on the nutrition facts label of any prepackaged food. Look for foods that contain 5 g of fiber or more per serving.  Talk with a diet and nutrition specialist (dietitian) if you   have questions about specific foods that are recommended or not recommended for your medical condition, especially if those foods are not listed below.  Gradually increase how much fiber you consume. If you increase your intake of dietary fiber too quickly, you may have bloating, cramping, or gas.  Drink plenty of water. Water helps you to digest fiber. What are tips for following this plan?  Eat a wide variety of high-fiber  foods.  Make sure that half of the grains that you eat each day are whole grains.  Eat breads and cereals that are made with whole-grain flour instead of refined flour or white flour.  Eat brown rice, bulgur wheat, or millet instead of white rice.  Start the day with a breakfast that is high in fiber, such as a cereal that contains 5 g of fiber or more per serving.  Use beans in place of meat in soups, salads, and pasta dishes.  Eat high-fiber snacks, such as berries, raw vegetables, nuts, and popcorn.  Choose whole fruits and vegetables instead of processed forms like juice or sauce. What foods can I eat?  Fruits Berries. Pears. Apples. Oranges. Avocado. Prunes and raisins. Dried figs. Vegetables Sweet potatoes. Spinach. Kale. Artichokes. Cabbage. Broccoli. Cauliflower. Green peas. Carrots. Squash. Grains Whole-grain breads. Multigrain cereal. Oats and oatmeal. Brown rice. Barley. Bulgur wheat. Millet. Quinoa. Bran muffins. Popcorn. Rye wafer crackers. Meats and other proteins Navy, kidney, and pinto beans. Soybeans. Split peas. Lentils. Nuts and seeds. Dairy Fiber-fortified yogurt. Beverages Fiber-fortified soy milk. Fiber-fortified orange juice. Other foods Fiber bars. The items listed above may not be a complete list of recommended foods and beverages. Contact a dietitian for more options. What foods are not recommended? Fruits Fruit juice. Cooked, strained fruit. Vegetables Fried potatoes. Canned vegetables. Well-cooked vegetables. Grains White bread. Pasta made with refined flour. White rice. Meats and other proteins Fatty cuts of meat. Fried chicken or fried fish. Dairy Milk. Yogurt. Cream cheese. Sour cream. Fats and oils Butters. Beverages Soft drinks. Other foods Cakes and pastries. The items listed above may not be a complete list of foods and beverages to avoid. Contact a dietitian for more information. Summary  Fiber is a type of carbohydrate. It is  found in fruits, vegetables, whole grains, and beans.  There are many health benefits of eating a high-fiber diet, such as preventing constipation, lowering blood cholesterol, helping with weight loss, and reducing your risk of heart disease, diabetes, and certain cancers.  Gradually increase your intake of fiber. Increasing too fast can result in cramping, bloating, and gas. Drink plenty of water while you increase your fiber.  The best sources of fiber include whole fruits and vegetables, whole grains, nuts, seeds, and beans. This information is not intended to replace advice given to you by your health care provider. Make sure you discuss any questions you have with your health care provider. Document Revised: 05/16/2017 Document Reviewed: 05/16/2017 Elsevier Patient Education  2020 Elsevier Inc.   

## 2020-06-16 ENCOUNTER — Encounter: Payer: Self-pay | Admitting: Internal Medicine

## 2020-06-16 ENCOUNTER — Other Ambulatory Visit: Payer: Self-pay

## 2020-06-16 ENCOUNTER — Ambulatory Visit: Payer: PPO | Admitting: Internal Medicine

## 2020-06-16 VITALS — BP 122/70 | HR 95 | Ht 68.0 in | Wt 170.0 lb

## 2020-06-16 DIAGNOSIS — J45991 Cough variant asthma: Secondary | ICD-10-CM | POA: Diagnosis not present

## 2020-06-16 DIAGNOSIS — Z87891 Personal history of nicotine dependence: Secondary | ICD-10-CM

## 2020-06-16 DIAGNOSIS — R911 Solitary pulmonary nodule: Secondary | ICD-10-CM

## 2020-06-16 DIAGNOSIS — R053 Chronic cough: Secondary | ICD-10-CM

## 2020-06-16 MED ORDER — PREDNISONE 10 MG PO TABS: ORAL_TABLET | ORAL | 0 refills | Status: DC

## 2020-06-16 MED ORDER — PREDNISONE 10 MG PO TABS: ORAL_TABLET | ORAL | 0 refills | Status: AC

## 2020-06-16 NOTE — Patient Instructions (Addendum)
ICD-10-CM   1. Cough variant asthma  J45.991   2. Chronic cough  R05.3   3. Nodule of lower lobe of left lung  R91.1   4. Former smoker, stopped smoking in distant past  Z87.891      Cough variant asthma Chronic cough  - you might still be in residual flare up versus recent flare up making chronic cough active again  Plan  - Take prednisone 40 mg daily x 2 days, then 20mg  daily x 2 days, then 10mg  daily x 2 days, then 5mg  daily x 2 days and stop - continue asmanex  Nodule of lower lobe of left lung Former smoker, stopped smoking in distant past  - given recent abnormal CXR nov 2021 and last CT chest being aug 2020 that had small lung nodule 90mm - best to to get repeat CT chest  Plan  - do  HRCT supine and prone in 6 weeks  Followup  - Dr Chase Caller or an APP in 6 weeks but after ct chest  - consider feno at followup

## 2020-06-16 NOTE — Progress Notes (Signed)
PCP Leighton Ruff, MD  HPI    IOV  07/08/2017  Chief Complaint  Patient presents with   Advice Only    Referred by Dr. Drema Dallas for cough x19months.  Pt states that his cough is  a dry cough. Pt was put on nexium x2 weeks for acid reflux which he states has loosened the mucus some.      74 year old male originally from [place] and a former Company secretary.  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  He tells me that mid September 2018 he went to the beach.  Upon return from the beach his granddaughter was sick with a cough and subsequently on April 16, 2017 he abruptly developed a cough.  Since then the cough has persisted.  The only thing that has improved and his cough is that he is no longer having significant night cough although he still does have some  amount of night cough.  This night cough resolved after opioid cough syrup.  Cough is made worse by talking, laughing and also randomly.  It is associated with clearing of the throat.  He feels a tightness in the upper chest.  There is no associated wheezing but there is still some residual nocturnal symptoms.  There is no associated acid reflux.  He has been on Nexium for a week or 2 without much relief.  He has not tried anything for nasal but he denies any nasal discharge.  He is not on any ACE inhibitors.  He does not have a previous history of asthma but his exam nitric oxide today is elevated borderline at 44 ppb.  He does clear the throat.  The cough is annoying.  He tells me that he did have a chest x-ray with his primary care physician that was clear.  The history is obtained from him, talking to his wife and review of the primary care physician referral notes.  Lab review she had a hemoglobin 11.6 g% in February 2016 and a eosinophils f 300 cells.  He is a former smoker  He does not have any shortness of breath.  He walks over a mile.  Sometimes he will cough and a pro-air can help him.  He does find the [employer] helps him.  At this point  in time he prefers for conservative simplistic line of treatment   OV 08/18/2017  Chief Complaint  Patient presents with   Follow-up    Pt states he has been doing good since last visit. Cough is better.   Follow-up chronic cough  After last visit he decided to take Asmanex.  He also followed a diet and took Prilosec.  With this the cough is more than 60% better as documented in the RSI cough score.  He does not want to take any more new medications.  He feels prednisone burst helped him a lot in the initial few days.  He is wondering which of these measures have helped him the most.  There are no new issues.  He wants to liberalize his diet which he says is actually helping him with the sugars and with the cough but he would like to be less disciplined about food.  He is willing to continue with Asmanex   feno 43 ppb  OV 02/28/2018  Chief Complaint  Patient presents with   Follow-up    Pt states things had been doing good for him and did go off of the inhaler but stated after he had been off of  the inhaler for about a week, the cough came back in the mornings and now he states he is coughing all the time. He states he is still not taking the medication and he states he is still coughing.    Follow-up chronic cough  He is here with his wife. He tells me that approximately one month ago because he was feeling well without any cough he stopped his Asmanex and then 2 weeks ago his cough returned. RSI cough score is 17. When he lies downhe has a cough. But he does not wake up in the middle of the night because of cough. There is no associated wheezing or shortness of breath or chest tightness or any change in his health status. Social history: His wife is new diagnosis of liver cirrhosis. She is going MRI today. Exam nitric oxide is in the indeterminate range today for him. Review of the chart indicates last chest x-ray was in 2010.    OV 05/31/2018  Subjective:  Patient ID: MUAD NOGA, male , DOB: [birth year] , age 86 y.o. , MRN: [id number] , ADDRESS: [address] [place] [facility] [address]   05/31/2018 -   Chief Complaint  Patient presents with   Follow-up    Doing well at this time.     HPI NISHANT SCHRECENGOST 74 y.o. -presents for follow-up of cough variant asthma.  Last visit we put him back on Asmanex after his cough deteriorated.  This was in August 2019.  With this his cough is significantly improved RSI cough score is 5.  He realizes the value of taking Asmanex.  He takes his Asmanex 1 puff 2 times daily.  The only interim issues that March 09, 2018 he ended up in the ER lab review shows normal.  It was for dizziness.  Socially his wife Fraser Din has had liver cyst surgery at [facility] in October 2019 and is doing well.  He is up-to-date with his flu shot but our internal immunization record shows that he has not had pneumonia vaccine.        OV 02/20/2019  Subjective:  Patient ID: TRISTYN DEMAREST, male , DOB: Feb 07, 1946 , age 89 y.o. , MRN: [id number] , ADDRESS: [address] [place] [facility] [address]   02/20/2019 -   Chief Complaint  Patient presents with   Cough    Having more trouble with phlegm.     HPI LANCE HUARACHA 74 y.o. -presents for chronic cough and cough variant asthma.  He is a former smoker 42 pack.  Last chest x-ray was August 2019.  At that time it was clear.  No previous CT scan of the chest.  He tells me that overall he is doing stable although in the last 6 weeks he has had increasing phlegm production.  It is not acute.  He has been socially isolating.  He has not come into contact with anyone with COVID.  He does go to church but mostly does [employer].  His RSI cough score shows significant deterioration as documented below.  The phlegm is not green but more like white.    OV 06/16/2020  Subjective:  Patient ID: SHRIHAAN PORZIO, male , DOB: May 12, 1946 , age 30 y.o. , MRN: [id number] , ADDRESS: [address] [place] [address] PCP  Parks Ranger Devonne Doughty, DO Patient Care Team: Olin Hauser, DO as PCP - General (Family Medicine) Jettie Booze, MD as PCP - Cardiology (Cardiology)  This Provider for this visit: Treatment  Team:  Attending Provider: Brand Males, MD    06/16/2020 -   Chief Complaint  Patient presents with   Follow-up    Pt states he had a flare up with asthma about 1 week ago which turned into pneumonia. Pt states he is now better. Has an occ cough which is better. Denies any complaints of wheezing or increased SOB.   Follow-up cough variant asthma on Asmanex Follow-up left upper lobe nodule and a remote smoker for millimeter August 2020 HPI ROCKWELL ZENTZ 74 y.o. -presents for follow-up.  Last seen in July 2020.  That was by me myself.  He presents now for follow-up.  He was supposed to go to [place] but because of Covid pandemic the history of his been postponed to March 2022.  He tells me it was the end of October 2021 he developed an asthma flare.  He is unclear why he had a flareup.  No sick contacts.  After that he played golf in [place] and this made it worse.  On number second 2020 when he had a chest x-ray with Dr. Raliegh Ip his primary care physician that showed some infiltrates.  He got antibiotics and prednisone.  This helped him.  Symptoms are improving but now he feels the cough is getting worse again.  He is wondering if it is his chronic cough getting worse.  Overall he continues to be compliant with his Asmanex.       Dr Lorenza Cambridge Reflux Symptom Index (> 13-15 suggestive of LPR cough) Results for WAYDEN, SCHWERTNER (MRN [id number]) as of 05/31/2018 09:46  Ref. Range 03/09/2018 18:36 03/09/2018 18:45  Eosinophils Absolute Latest Ref Range: 0.0 - 0.7 K/uL 0.4    0 -> 5  =  none ->severe problem.td 07/08/2017  08/18/2017  02/28/2018  05/31/2018 asmanex 1 bid 02/20/2019   Hoarseness of problem with voice 1 2 2 3 3   Clearing  Of Throat 3 2 2  0 2  Excess throat mucus or  feeling of post nasal drip 1 0 3 0 3  Difficulty swallowing food, liquid or tablets 0 0 0 0 0  Cough after eating or lying down 4 1 5 1 1   Breathing difficulties or choking episodes 0 0 0 0 0  Troublesome or annoying cough 5 2 5 1 3   Sensation of something sticking in throat or lump in throat 0 0 0 0 0  Heartburn, chest pain, indigestion, or stomach acid coming up 0 0 0 0 0  TOTAL 14 7 17 5 12     Lab Results  Component Value Date   NITRICOXIDE 39 02/28/2018     ROS - per HPI     has a past medical history of Abnormal chest CT, Allergic rhinitis, unspecified, Anemia, Aortic atherosclerosis ([place]), BPH (benign prostatic hyperplasia), COPD (chronic obstructive pulmonary disease) ([place]), Coronary artery disease, Cough variant asthma, Glaucoma, Hypertension, Other chronic pain, Pancreatic lesion, Pure hypercholesterolemia, unspecified, 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   reports that he quit smoking about 48 years ago. His smoking use included cigarettes. He has a 78.00 pack-year smoking history. He has never used smokeless tobacco.  Past Surgical History:  Procedure Laterality Date   BUNIONECTOMY Left 2003   hammer toe as well   BUNIONECTOMY WITH HAMMERTOE RECONSTRUCTION Left 2013   repeat   CERVICAL DISC SURGERY     GREEN LIGHT LASER TURP (TRANSURETHRAL RESECTION OF PROSTATE  2010   laser, shrink prostate  KNEE ARTHROSCOPY Right 1991   LEG SURGERY Left    BENIGN BONE TUMOR   LUMBAR [place] SURGERY  2010   discectomy   LUMBAR DISC SURGERY  2011   PROSTATE SURGERY  2002   shrink prostate   ROTATOR CUFF REPAIR Left 1999   debride, remove bonespur   ROTATOR CUFF REPAIR Right 1996    Allergies  Allergen Reactions   Ciprofloxacin Diarrhea and Nausea And Vomiting   Duloxetine Other (See Comments)    Severe fatique and constipation.   Morphine And Related Diarrhea and Nausea And Vomiting     Immunization History  Administered Date(s) Administered   Fluad Quad(high Dose 65+) 05/13/2019, 06/09/2020   Influenza, High Dose Seasonal PF 07/15/2017, 04/12/2018   PFIZER SARS-COV-2 Vaccination 02/13/2020, 03/05/2020   Pneumococcal Conjugate-13 01/28/2014   Pneumococcal Polysaccharide-23 03/04/2008   Tdap 07/12/2011   Zoster Recombinat (Shingrix) 08/17/2017    Family History  Problem Relation Age of Onset   Heart disease Mother 30   Emphysema Father 22   Heart disease Brother 55     Current Outpatient Medications:    albuterol (VENTOLIN HFA) 108 (90 Base) MCG/ACT inhaler, Inhale 1-2 puffs into the lungs every 4 (four) hours as needed for wheezing or shortness of breath (cough)., Disp: 1 each, Rfl: 2   amLODipine (NORVASC) 5 MG tablet, Take 5 mg by mouth daily., Disp: , Rfl:    Calcium Carb-Cholecalciferol (CALCIUM + D3 PO), Take by mouth., Disp: , Rfl:    diclofenac Sodium (VOLTAREN) 1 % GEL, Apply topically 4 (four) times daily., Disp: , Rfl:    ferrous sulfate 325 (65 FE) MG tablet, Take 325 mg by mouth daily with breakfast., Disp: , Rfl:    fluticasone (FLONASE) 50 MCG/ACT nasal spray, Place 1 spray into both nostrils daily., Disp: 16 g, Rfl: 4   FREESTYLE LITE test strip, , Disp: , Rfl:    Ginger, Zingiber officinalis, (GINGER PO), Take by mouth., Disp: , Rfl:    Glucosamine-Chondroitin (GLUCOSAMINE CHONDR COMPLEX PO), Take by mouth 2 (two) times daily. , Disp: , Rfl:    hydrochlorothiazide (HYDRODIURIL) 12.5 MG tablet, Take 1 tablet by mouth daily., Disp: , Rfl:    ibuprofen (ADVIL,MOTRIN) 200 MG tablet, Take 200-800 mg by mouth every 6 (six) hours as needed for fever, headache, mild pain, moderate pain or cramping. , Disp: , Rfl:    ketoconazole (NIZORAL) 2 % shampoo, APPLY SHAMPOO AND LATHER 3 TIMES WEEKLY. LEAVE ON FOR 5-8 MINUTES RINSE WELL, Disp: 120 mL, Rfl: 3   Lancets (FREESTYLE) lancets, Use to test your blood sugar once a day, Disp: ,  Rfl:    loratadine (CLARITIN) 10 MG tablet, Take 10 mg by mouth daily., Disp: , Rfl:    losartan (COZAAR) 50 MG tablet, Take 1 tablet (50 mg total) by mouth daily., Disp: 90 tablet, Rfl: 3   metFORMIN (GLUCOPHAGE-XR) 500 MG 24 hr tablet, Take 500 mg by mouth at bedtime., Disp: , Rfl:    Mometasone Furoate (ASMANEX HFA) 200 MCG/ACT AERO, Inhale 2 puffs into the lungs in the morning and at bedtime., Disp: 13 g, Rfl: 5   Multiple Vitamin (MULTIVITAMIN WITH MINERALS) TABS tablet, Take 1 tablet by mouth daily., Disp: , Rfl:    rosuvastatin (CRESTOR) 5 MG tablet, Take 5 mg by mouth daily. , Disp: , Rfl:    tamsulosin (FLOMAX) 0.4 MG CAPS capsule, Take 1 capsule by mouth daily., Disp: , Rfl:    terbinafine (LAMISIL) 250 MG tablet, Take 1  tablet (250 mg total) by mouth daily., Disp: 30 tablet, Rfl: 2      Objective:   Vitals:   06/16/20 1023  BP: 122/70  Pulse: 95  SpO2: 99%  Weight: 170 lb (77.1 kg)  Height: 5\' 8"  (1.727 m)    Estimated body mass index is 25.85 kg/m as calculated from the following:   Height as of this encounter: 5\' 8"  (1.727 m).   Weight as of this encounter: 170 lb (77.1 kg).  @WEIGHTCHANGE @  [employer]   06/16/20 1023  Weight: 170 lb (77.1 kg)     Physical Exam   General: No distress. Looks well Neuro: Alert and Oriented x 3. GCS 15. Speech normal Psych: Pleasant Resp:  Barrel Chest - no.  Wheeze - occ mild, Crackles - no, No overt respiratory distress CVS: Normal heart sounds. Murmurs - no Ext: Stigmata of Connective Tissue Disease - no HEENT: Normal upper airway. PEERL +. No post nasal drip        Assessment:       ICD-10-CM   1. Cough variant asthma  J45.991   2. Chronic cough  R05.3   3. Nodule of lower lobe of left lung  R91.1   4. Former smoker, stopped smoking in distant past  Z87.891        Plan:     Patient Instructions     ICD-10-CM   1. Cough variant asthma  J45.991   2. Chronic cough  R05.3   3. Nodule of lower  lobe of left lung  R91.1   4. Former smoker, stopped smoking in distant past  Z87.891      Cough variant asthma Chronic cough  - you might still be in residual flare up versus recent flare up making chronic cough active again  Plan  - Take prednisone 40 mg daily x 2 days, then 20mg  daily x 2 days, then 10mg  daily x 2 days, then 5mg  daily x 2 days and stop - continue asmanex  Nodule of lower lobe of left lung Former smoker, stopped smoking in distant past  - given recent abnormal CXR nov 2021 and last CT chest being aug 2020 that had small lung nodule 52mm - best to to get repeat CT chest  Plan  - do  HRCT supine and prone in 6 weeks  Followup  - Dr Chase Caller or an APP in 6 weeks but after ct chest  - consider feno at followup     SIGNATURE    Dr. Brand Males, M.D., F.C.C.P,  Pulmonary and Critical Care Medicine Staff Physician, [place] Director - Interstitial Lung Disease  Program  Pulmonary [place] at [place], [place], [address]  Pager: [phone], If no answer or between  15:00h - 7:00h: call [phone] Telephone: [phone]  11:01 AM 06/16/2020

## 2020-06-24 ENCOUNTER — Ambulatory Visit: Payer: PPO | Admitting: Podiatry

## 2020-07-24 ENCOUNTER — Ambulatory Visit (INDEPENDENT_AMBULATORY_CARE_PROVIDER_SITE_OTHER)
Admission: RE | Admit: 2020-07-24 | Discharge: 2020-07-24 | Disposition: A | Discharge status: Home / Self Care | Payer: PPO | Source: Ambulatory Visit | Attending: Internal Medicine | Admitting: Internal Medicine

## 2020-07-24 ENCOUNTER — Other Ambulatory Visit: Payer: Self-pay

## 2020-07-24 DIAGNOSIS — R059 Cough, unspecified: Secondary | ICD-10-CM | POA: Diagnosis not present

## 2020-07-24 DIAGNOSIS — R911 Solitary pulmonary nodule: Secondary | ICD-10-CM | POA: Diagnosis not present

## 2020-07-28 ENCOUNTER — Ambulatory Visit: Payer: PPO | Admitting: Pulmonary Disease

## 2020-07-29 ENCOUNTER — Ambulatory Visit: Payer: PPO | Admitting: Podiatry

## 2020-07-31 ENCOUNTER — Encounter: Payer: Self-pay | Admitting: Pulmonary Disease

## 2020-07-31 ENCOUNTER — Encounter: Payer: Self-pay | Admitting: Podiatry

## 2020-07-31 ENCOUNTER — Other Ambulatory Visit: Payer: Self-pay

## 2020-07-31 ENCOUNTER — Ambulatory Visit (INDEPENDENT_AMBULATORY_CARE_PROVIDER_SITE_OTHER): Payer: PPO | Admitting: Podiatry

## 2020-07-31 ENCOUNTER — Ambulatory Visit: Payer: PPO | Admitting: Pulmonary Disease

## 2020-07-31 VITALS — BP 118/62 | HR 110 | Temp 98.4°F | Ht 69.0 in | Wt 170.6 lb

## 2020-07-31 DIAGNOSIS — J45991 Cough variant asthma: Secondary | ICD-10-CM

## 2020-07-31 DIAGNOSIS — J432 Centrilobular emphysema: Secondary | ICD-10-CM | POA: Diagnosis not present

## 2020-07-31 DIAGNOSIS — M2041 Other hammer toe(s) (acquired), right foot: Secondary | ICD-10-CM | POA: Diagnosis not present

## 2020-07-31 DIAGNOSIS — R918 Other nonspecific abnormal finding of lung field: Secondary | ICD-10-CM | POA: Diagnosis not present

## 2020-07-31 DIAGNOSIS — M2042 Other hammer toe(s) (acquired), left foot: Secondary | ICD-10-CM

## 2020-07-31 DIAGNOSIS — R053 Chronic cough: Secondary | ICD-10-CM | POA: Diagnosis not present

## 2020-07-31 DIAGNOSIS — Z0000 Encounter for general adult medical examination without abnormal findings: Secondary | ICD-10-CM

## 2020-07-31 DIAGNOSIS — L600 Ingrowing nail: Secondary | ICD-10-CM

## 2020-07-31 MED ORDER — AZITHROMYCIN 250 MG PO TABS: ORAL_TABLET | ORAL | 0 refills | Status: DC

## 2020-07-31 MED ORDER — PREDNISONE 10 MG PO TABS: ORAL_TABLET | ORAL | 0 refills | Status: DC

## 2020-07-31 NOTE — Assessment & Plan Note (Signed)
Patient reporting improvement of chronic cough  If cough symptoms return may need to consider increase of inhaler to ICS/LABA

## 2020-07-31 NOTE — Assessment & Plan Note (Signed)
Mild centrilobular and paraseptal emphysema seen 4 mm pulmonary nodule considered benign

## 2020-07-31 NOTE — Assessment & Plan Note (Signed)
78-pack-year smoking history Former smoker Paraseptal and centrilobular emphysema seen on high-resolution CT chest  Plan: Continue clinically monitor Recommend COVID-19 booster Z-Pak and prednisone taper provided today as patient is traveling internationally in March/2022 Continue Asmanex inhaler

## 2020-07-31 NOTE — Progress Notes (Signed)
@Patient  ID: Isaac Weber, male    DOB: 1945-10-15, 75 y.o.   MRN: [id number]  Chief Complaint  Patient presents with  . Follow-up    Doing well    Referring provider: Nobie Putnam *  HPI:  75 year old male former smoker followed in our office for cough variant asthma  PMH: Hypertension, type 2 diabetes, hyperlipidemia Smoker/ Smoking History: Former smoker.  Quit 1973.  78-pack-year smoking history. Maintenance: Asmanex 200 Pt of: Dr. Chase Caller  07/31/2020  - Visit   76 year old male former smoker followed in our office for cough variant asthma.  Established with Dr. Chase Caller.  Patient presenting to office today as a 6-week follow-up.  Patient was last seen by Dr. Chase Caller in November/2021 plan of care at that office visit was for him to be treated with prednisone, continue Asmanex.  High-resolution CT chest was encouraged to be repeated in 6 weeks.   Patient reports that his cough has improved with third round of prednisone that was provided by Dr. Chase Caller in November/2021.  Patient plans on traveling to [place] in March/2022.  He has received the first 2 COVID-19 vaccinations.  He is undecided on whether or not he will receive the booster.  He has no significant acute respiratory complaints today.  Questionaires / Pulmonary Flowsheets:   ACT:  Asthma Control Test ACT Total Score  07/31/2020 25  06/16/2020 16    MMRC: No flowsheet data found.  Epworth:  No flowsheet data found.  Tests:   07/24/2020-CT chest high-res-no evidence of ILD, stable 4 mm left upper lobe pulmonary nodule considered benign, mild centrilobular and paraseptal emphysema with saber-sheath trachea compatible with provided history of COPD  08/30/2019-spirometry-FVC 3.1 (76% predicted), ratio 81, FEV1 2.6 (86% predicted)  FENO:  Lab Results  Component Value Date   NITRICOXIDE 39 02/28/2018    PFT: No flowsheet data found.  WALK:  No flowsheet data found.  Imaging: CT Chest  High Resolution  Result Date: 07/24/2020 CLINICAL DATA:  Follow-up left upper lobe pulmonary nodule. COPD. Persistent cough. Pneumonia in November. EXAM: CT CHEST WITHOUT CONTRAST TECHNIQUE: Multidetector CT imaging of the chest was performed following the standard protocol without intravenous contrast. High resolution imaging of the lungs, as well as inspiratory and expiratory imaging, was performed. COMPARISON:  03/13/2027 chest CT. FINDINGS: Cardiovascular: Normal heart size. No significant pericardial effusion/thickening. Left anterior descending and left circumflex coronary atherosclerosis. Atherosclerotic nonaneurysmal thoracic aorta. Normal caliber pulmonary arteries. Mediastinum/Nodes: No discrete thyroid nodules. Unremarkable esophagus. No pathologically enlarged axillary, mediastinal or hilar lymph nodes, noting limited sensitivity for the detection of hilar adenopathy on this noncontrast study. Lungs/Pleura: No pneumothorax. No pleural effusion. Mild centrilobular and paraseptal emphysema with diffuse bronchial wall thickening and saber sheath trachea. Stable tiny calcified granulomas in both lungs. Peripheral left upper lobe 4 mm solid pulmonary nodule (series 3/image 35), stable, considered benign. No acute consolidative airspace disease, lung masses or new significant pulmonary nodules. No significant regions of subpleural reticulation, ground-glass attenuation, traction bronchiectasis, architectural distortion or frank honeycombing. No significant lobular air trapping or evidence of tracheobronchomalacia on the expiration sequence. Upper abdomen: No acute abnormality. Musculoskeletal: No aggressive appearing focal osseous lesions. Soft tissue anchor partially visualized in the right humeral head. Mild thoracic spondylosis. IMPRESSION: 1. No evidence of interstitial lung disease. No acute pulmonary disease. 2. Stable 4 mm left upper lobe solid pulmonary nodule, considered benign. 3. Mild  centrilobular and paraseptal emphysema with diffuse bronchial wall thickening and saber sheath trachea, compatible with the provided history  of COPD. 4. Two vessel coronary atherosclerosis. 5. Aortic Atherosclerosis (ICD10-I70.0) and Emphysema (ICD10-J43.9). Electronically Signed   By: Ilona Sorrel M.D.   On: 07/24/2020 11:08    Lab Results:  CBC    Component Value Date/Time   WBC 11.4 (H) 06/02/2020 0852   RBC 4.39 06/02/2020 0852   HGB 13.8 06/02/2020 0852   HGB 11.6 (L) 09/16/2014 1354   HCT 39.6 06/02/2020 0852   HCT 33.6 (L) 09/16/2014 1354   PLT 194 06/02/2020 0852   PLT 232 09/16/2014 1354   MCV 90.2 06/02/2020 0852   MCV 81.4 09/16/2014 1354   MCH 31.4 06/02/2020 0852   MCHC 34.8 06/02/2020 0852   RDW 12.7 06/02/2020 0852   RDW 13.9 09/16/2014 1354   LYMPHSABS 2,565 06/02/2020 0852   LYMPHSABS 1.8 09/16/2014 1354   MONOABS 0.9 03/09/2018 1836   MONOABS 0.6 09/16/2014 1354   EOSABS 205 06/02/2020 0852   EOSABS 0.3 09/16/2014 1354   BASOSABS 57 06/02/2020 0852   BASOSABS 0.0 09/16/2014 1354    BMET    Component Value Date/Time   NA 135 06/02/2020 0852   NA 140 09/16/2014 1354   K 3.5 06/02/2020 0852   K 3.7 09/16/2014 1354   CL 99 06/02/2020 0852   CO2 26 06/02/2020 0852   CO2 26 09/16/2014 1354   GLUCOSE 114 (H) 06/02/2020 0852   GLUCOSE 153 (H) 09/16/2014 1354   BUN 21 06/02/2020 0852   BUN 16.7 09/16/2014 1354   CREATININE 0.79 06/02/2020 0852   CREATININE 0.9 09/16/2014 1354   CALCIUM 9.5 06/02/2020 0852   CALCIUM 9.4 09/16/2014 1354   GFRNONAA 88 06/02/2020 0852   GFRAA 102 06/02/2020 0852    BNP No results found for: BNP  ProBNP No results found for: PROBNP  Specialty Problems      Pulmonary Problems   Cough variant asthma   Centrilobular emphysema (HCC)   Chronic cough      Allergies  Allergen Reactions  . Ciprofloxacin Diarrhea and Nausea And Vomiting  . Duloxetine Other (See Comments)    Severe fatique and constipation.  .  Morphine And Related Diarrhea and Nausea And Vomiting    Immunization History  Administered Date(s) Administered  . Fluad Quad(high Dose 65+) 05/13/2019, 06/09/2020  . Influenza, High Dose Seasonal PF 07/15/2017, 04/12/2018  . PFIZER SARS-COV-2 Vaccination 02/13/2020, 03/05/2020  . Pneumococcal Conjugate-13 01/28/2014  . Pneumococcal Polysaccharide-23 03/04/2008  . Tdap 07/12/2011  . Zoster Recombinat (Shingrix) 08/17/2017    Past Medical History:  Diagnosis Date  . Abnormal chest CT   . Allergic rhinitis, unspecified   . Anemia   . Aortic atherosclerosis ([place])   . BPH (benign prostatic hyperplasia)   . COPD (chronic obstructive pulmonary disease) ([place])   . Coronary artery disease   . Cough variant asthma   . Glaucoma   . Hypertension   . Other chronic pain   . Pancreatic lesion   . Pure hypercholesterolemia, unspecified   . 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 Tx: EDC  . Squamous cell carcinoma of skin 02/21/2020   Right neck proximal mandible. WD SCC, ulcerated. [facility] 04/29/2020    Tobacco History: Social History   Tobacco Use  Smoking Status Former Smoker  . Packs/day: 3.00  . Years: 26.00  . Pack years: 78.00  . Types: Cigarettes  . Quit date: 09/17/1971  . Years since quitting: 48.9  Smokeless Tobacco Never Used   Counseling  given: Not Answered   Continue to not smoke  Outpatient Encounter Medications as of 07/31/2020  Medication Sig  . albuterol (VENTOLIN HFA) 108 (90 Base) MCG/ACT inhaler Inhale 1-2 puffs into the lungs every 4 (four) hours as needed for wheezing or shortness of breath (cough).  Marland Kitchen amLODipine (NORVASC) 5 MG tablet Take 5 mg by mouth daily.  Marland Kitchen azithromycin (ZITHROMAX) 250 MG tablet 500mg  (two tablets) today, then 250mg  (1 tablet) for the next 4 days  . Calcium Carb-Cholecalciferol (CALCIUM + D3 PO) Take by mouth.  . diclofenac Sodium (VOLTAREN) 1 % GEL Apply topically 4 (four)  times daily.  . ferrous sulfate 325 (65 FE) MG tablet Take 325 mg by mouth daily with breakfast.  . fluticasone (FLONASE) 50 MCG/ACT nasal spray Place 1 spray into both nostrils daily.  FREESTYLE LITE test strip   . Ginger, Zingiber officinalis, (GINGER PO) Take by mouth.  . Glucosamine-Chondroitin (GLUCOSAMINE CHONDR COMPLEX PO) Take by mouth 2 (two) times daily.   . hydrochlorothiazide (HYDRODIURIL) 12.5 MG tablet Take 1 tablet by mouth daily.  ibuprofen (ADVIL,MOTRIN) 200 MG tablet Take 200-800 mg by mouth every 6 (six) hours as needed for fever, headache, mild pain, moderate pain or cramping.   Marland Kitchen ketoconazole (NIZORAL) 2 % shampoo APPLY SHAMPOO AND LATHER 3 TIMES WEEKLY. LEAVE ON FOR 5-8 MINUTES RINSE WELL  . Lancets (FREESTYLE) lancets Use to test your blood sugar once a day  . loratadine (CLARITIN) 10 MG tablet Take 10 mg by mouth daily.  Marland Kitchen losartan (COZAAR) 50 MG tablet Take 1 tablet (50 mg total) by mouth daily.  . metFORMIN (GLUCOPHAGE-XR) 500 MG 24 hr tablet Take 500 mg by mouth at bedtime.  . Mometasone Furoate (ASMANEX HFA) 200 MCG/ACT AERO Inhale 2 puffs into the lungs in the morning and at bedtime.  . Multiple Vitamin (MULTIVITAMIN WITH MINERALS) TABS tablet Take 1 tablet by mouth daily.  . predniSONE (DELTASONE) 10 MG tablet 4 tabs for 2 days, then 3 tabs for 2 days, 2 tabs for 2 days, then 1 tab for 2 days, then stop  . rosuvastatin (CRESTOR) 5 MG tablet Take 5 mg by mouth daily.   . tamsulosin (FLOMAX) 0.4 MG CAPS capsule Take 1 capsule by mouth daily.  Marland Kitchen terbinafine (LAMISIL) 250 MG tablet Take 1 tablet (250 mg total) by mouth daily.   No facility-administered encounter medications on file as of 07/31/2020.     Review of Systems  Review of Systems  Constitutional: Negative for activity change, chills, fatigue, fever and unexpected weight change.  HENT: Negative for congestion, postnasal drip, rhinorrhea, sinus pressure, sinus pain and sore throat.   Eyes: Negative.    Respiratory: Negative for cough, shortness of breath and wheezing.   Cardiovascular: Negative for chest pain and palpitations.  Gastrointestinal: Negative for constipation, diarrhea, nausea and vomiting.  Endocrine: Negative.   Genitourinary: Negative.   Musculoskeletal: Negative.   Skin: Negative.   Neurological: Negative for dizziness and headaches.  Psychiatric/Behavioral: Negative.  Negative for dysphoric mood. The patient is not nervous/anxious.   All other systems reviewed and are negative.    Physical Exam  BP 118/62 (BP Location: Left Arm, Cuff Size: Normal)   Pulse (!) 110   Temp 98.4 F (36.9 C) (Oral)   Ht 5\' 9"  (1.753 m)   Wt 170 lb 9.6 oz (77.4 kg)   SpO2 98%   BMI 25.19 kg/m   Wt Readings from Last 5 Encounters:  07/31/20 170 lb 9.6 oz (77.4  kg)  06/16/20 170 lb (77.1 kg)  06/12/20 171 lb 9.6 oz (77.8 kg)  06/09/20 169 lb (76.7 kg)  05/27/20 165 lb 12.8 oz (75.2 kg)    BMI Readings from Last 5 Encounters:  07/31/20 25.19 kg/m  06/16/20 25.85 kg/m  06/12/20 26.09 kg/m  06/09/20 25.70 kg/m  05/27/20 24.48 kg/m     Physical Exam Vitals and nursing note reviewed.  Constitutional:      General: He is not in acute distress.    Appearance: Normal appearance. He is normal weight.  HENT:     Head: Normocephalic and atraumatic.     Right Ear: Hearing and external ear normal.     Left Ear: Hearing and external ear normal.     Nose: No mucosal edema.     Right Turbinates: Not enlarged.     Left Turbinates: Not enlarged.     Mouth/Throat:     Mouth: Mucous membranes are dry.     Pharynx: Oropharynx is clear. No oropharyngeal exudate.  Eyes:     Pupils: Pupils are equal, round, and reactive to light.  Cardiovascular:     Rate and Rhythm: Normal rate and regular rhythm.     Pulses: Normal pulses.     Heart sounds: Normal heart sounds. No murmur heard.   Pulmonary:     Effort: Pulmonary effort is normal.     Breath sounds: Normal breath sounds.  No decreased breath sounds, wheezing or rales.  Musculoskeletal:     Cervical back: Normal range of motion.     Right lower leg: No edema.     Left lower leg: No edema.  Lymphadenopathy:     Cervical: No cervical adenopathy.  Skin:    General: Skin is warm and dry.     Capillary Refill: Capillary refill takes less than 2 seconds.     Findings: No erythema or rash.  Neurological:     General: No focal deficit present.     Mental Status: He is alert and oriented to person, place, and time.     Motor: No weakness.     Coordination: Coordination normal.     Gait: Gait is intact. Gait normal.  Psychiatric:        Mood and Affect: Mood normal.        Behavior: Behavior normal. Behavior is cooperative.        Thought Content: Thought content normal.        Judgment: Judgment normal.       Assessment & Plan:   Centrilobular emphysema (HCC) 78-pack-year smoking history Former smoker Paraseptal and centrilobular emphysema seen on high-resolution CT chest  Plan: Continue clinically monitor Recommend COVID-19 booster Z-Pak and prednisone taper provided today as patient is traveling internationally in March/2022 Continue Asmanex inhaler  Cough variant asthma Plan: Continue Asmanex inhaler If patient has recurrent bronchitis or chronic cough flare again may need to consider increasing inhaler to an ICS/LABA such as Symbicort 160  Abnormal findings on diagnostic imaging of lung Mild centrilobular and paraseptal emphysema seen 4 mm pulmonary nodule considered benign  Chronic cough Patient reporting improvement of chronic cough  If cough symptoms return may need to consider increase of inhaler to ICS/LABA  Healthcare maintenance Recommend patient obtain COVID-19 booster when available    Return in about 4 months (around 11/28/2020), or if symptoms worsen or fail to improve, for Follow up with Dr. Purnell Shoemaker.   Lauraine Rinne, NP 07/31/2020   This appointment required 22  minutes of  patient care (this includes precharting, chart review, review of results, face-to-face care, etc.).

## 2020-07-31 NOTE — Assessment & Plan Note (Signed)
Recommend patient obtain COVID-19 booster when available

## 2020-07-31 NOTE — Assessment & Plan Note (Signed)
Plan: Continue Asmanex inhaler If patient has recurrent bronchitis or chronic cough flare again may need to consider increasing inhaler to an ICS/LABA such as Symbicort 160

## 2020-07-31 NOTE — Patient Instructions (Addendum)
You were seen today by Coral Ceo, NP  for:   1. Cough variant asthma 2. Centrilobular emphysema (HCC)  - predniSONE (DELTASONE) 10 MG tablet; 4 tabs for 2 days, then 3 tabs for 2 days, 2 tabs for 2 days, then 1 tab for 2 days, then stop  Dispense: 20 tablet; Refill: 0 - azithromycin (ZITHROMAX) 250 MG tablet; 500mg  (two tablets) today, then 250mg  (1 tablet) for the next 4 days  Dispense: 6 tablet; Refill: 0  We will provide course of prednisone as well as Z-Pak as utricular are planning on traveling to in March/2022  Please notify our office if you have to utilize these  Continue Asmanex inhaler  As discussed today if you have recurrent bronchitis or cough-like symptoms may need to consider changing maintenance inhaler  3. Chronic cough  Glad this has resolved  4. Abnormal findings on diagnostic imaging of lung  Stable 4 mm pulmonary nodule considered benign  5. Healthcare maintenance  Would consider COVID-19 booster vaccination when available   We recommend today:   Meds ordered this encounter  Medications  . predniSONE (DELTASONE) 10 MG tablet    Sig: 4 tabs for 2 days, then 3 tabs for 2 days, 2 tabs for 2 days, then 1 tab for 2 days, then stop    Dispense:  20 tablet    Refill:  0  . azithromycin (ZITHROMAX) 250 MG tablet    Sig: 500mg  (two tablets) today, then 250mg  (1 tablet) for the next 4 days    Dispense:  6 tablet    Refill:  0    Follow Up:    Return in about 4 months (around 11/28/2020), or if symptoms worsen or fail to improve, for Follow up with Dr. April/2022.   Notification of test results are managed in the following manner: If there are  any recommendations or changes to the  plan of care discussed in office today,  we will contact you and let you know what they are. If you do not hear from , then your results are normal and you can view them through your  MyChart account , or a letter will be sent to you. Thank you again for trusting  with your care  - Thank you, [facility]    It is flu season:   >>> Best ways to protect herself from the flu: Receive the yearly flu vaccine, practice good hand hygiene washing with soap and also using hand sanitizer when available, eat a nutritious meals, get adequate rest, hydrate appropriately       Please contact the office if your symptoms worsen or you have concerns that you are not improving.   Thank you for choosing [facility] for your healthcare, and for allowing 01/28/2021 to partner with you on your healthcare journey. I am thankful to be able to provide care to you today.   Dalbert Mayotte FNP-C

## 2020-08-01 ENCOUNTER — Encounter: Payer: Self-pay | Admitting: Podiatry

## 2020-08-01 NOTE — Progress Notes (Signed)
Subjective:  Patient ID: Isaac Weber, male    DOB: [birth year],  MRN: [id number]  Chief Complaint  Patient presents with  . Diabetes  . Nail Problem    Patient presents today to discuss diabetic shoes.  He also c/o ingrown toenail right hallux medial border.  He states "its a little touchy"    75 y.o. male presents with the above complaint.  Patient presents with complaint of ingrown to the right hallux medial border.  Patient states painful to touch.  The third toe is doing just fine.  He also would like to get diabetic shoes as his last one has worn out as well as he is currently his last A1c is 7.0.  He denies any other acute complaints he does have hammertoe contractures that could lead to ulceration   Review of Systems: Negative except as noted in the HPI. Denies N/V/F/Ch.  Past Medical History:  Diagnosis Date  . Abnormal chest CT   . Allergic rhinitis, unspecified   . Anemia   . Aortic atherosclerosis ([place])   . BPH (benign prostatic hyperplasia)   . COPD (chronic obstructive pulmonary disease) ([place])   . Coronary artery disease   . Cough variant asthma   . Glaucoma   . Hypertension   . Other chronic pain   . Pancreatic lesion   . Pure hypercholesterolemia, unspecified   . 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 Tx: EDC  . Squamous cell carcinoma of skin 02/21/2020   Right neck proximal mandible. WD SCC, ulcerated. [facility] 04/29/2020    Current Outpatient Medications:  .  albuterol (VENTOLIN HFA) 108 (90 Base) MCG/ACT inhaler, Inhale 1-2 puffs into the lungs every 4 (four) hours as needed for wheezing or shortness of breath (cough)., Disp: 1 each, Rfl: 2 .  amLODipine (NORVASC) 5 MG tablet, Take 5 mg by mouth daily., Disp: , Rfl:  .  azithromycin (ZITHROMAX) 250 MG tablet, 500mg  (two tablets) today, then 250mg  (1 tablet) for the next 4 days, Disp: 6 tablet, Rfl: 0 .  Calcium Carb-Cholecalciferol (CALCIUM + D3  PO), Take by mouth., Disp: , Rfl:  .  diclofenac Sodium (VOLTAREN) 1 % GEL, Apply topically 4 (four) times daily., Disp: , Rfl:  .  ferrous sulfate 325 (65 FE) MG tablet, Take 325 mg by mouth daily with breakfast., Disp: , Rfl:  .  fluticasone (FLONASE) 50 MCG/ACT nasal spray, Place 1 spray into both nostrils daily., Disp: 16 g, Rfl: 4 .  FREESTYLE LITE test strip, , Disp: , Rfl:  .  Ginger, Zingiber officinalis, (GINGER PO), Take by mouth., Disp: , Rfl:  .  Glucosamine-Chondroitin (GLUCOSAMINE CHONDR COMPLEX PO), Take by mouth 2 (two) times daily. , Disp: , Rfl:  .  hydrochlorothiazide (HYDRODIURIL) 12.5 MG tablet, Take 1 tablet by mouth daily., Disp: , Rfl:  .  ibuprofen (ADVIL,MOTRIN) 200 MG tablet, Take 200-800 mg by mouth every 6 (six) hours as needed for fever, headache, mild pain, moderate pain or cramping. , Disp: , Rfl:  .  ketoconazole (NIZORAL) 2 % shampoo, APPLY SHAMPOO AND LATHER 3 TIMES WEEKLY. LEAVE ON FOR 5-8 MINUTES RINSE WELL, Disp: 120 mL, Rfl: 3 .  Lancets (FREESTYLE) lancets, Use to test your blood sugar once a day, Disp: , Rfl:  .  loratadine (CLARITIN) 10 MG tablet, Take 10 mg by mouth daily., Disp: , Rfl:  .  losartan (COZAAR) 50 MG tablet, Take 1 tablet (50 mg total)  by mouth daily., Disp: 90 tablet, Rfl: 3 .  metFORMIN (GLUCOPHAGE-XR) 500 MG 24 hr tablet, Take 500 mg by mouth at bedtime., Disp: , Rfl:  .  Mometasone Furoate (ASMANEX HFA) 200 MCG/ACT AERO, Inhale 2 puffs into the lungs in the morning and at bedtime., Disp: 13 g, Rfl: 5 .  Multiple Vitamin (MULTIVITAMIN WITH MINERALS) TABS tablet, Take 1 tablet by mouth daily., Disp: , Rfl:  .  predniSONE (DELTASONE) 10 MG tablet, 4 tabs for 2 days, then 3 tabs for 2 days, 2 tabs for 2 days, then 1 tab for 2 days, then stop, Disp: 20 tablet, Rfl: 0 .  rosuvastatin (CRESTOR) 5 MG tablet, Take 5 mg by mouth daily. , Disp: , Rfl:  .  tamsulosin (FLOMAX) 0.4 MG CAPS capsule, Take 1 capsule by mouth daily., Disp: , Rfl:  .   terbinafine (LAMISIL) 250 MG tablet, Take 1 tablet (250 mg total) by mouth daily., Disp: 30 tablet, Rfl: 2  Social History   Tobacco Use  Smoking Status Former Smoker  . Packs/day: 3.00  . Years: 26.00  . Pack years: 78.00  . Types: Cigarettes  . Quit date: 09/17/1971  . Years since quitting: 48.9  Smokeless Tobacco Never Used    Allergies  Allergen Reactions  . Ciprofloxacin Diarrhea and Nausea And Vomiting  . Duloxetine Other (See Comments)    Severe fatique and constipation.  . Morphine And Related Diarrhea and Nausea And Vomiting   Objective:  There were no vitals filed for this visit. There is no height or weight on file to calculate BMI. Constitutional Well developed. Well nourished.  Vascular Dorsalis pedis pulses palpable bilaterally. Posterior tibial pulses palpable bilaterally. Capillary refill normal to all digits.  No cyanosis or clubbing noted. Pedal hair growth normal.  Neurologic Normal speech. Oriented to person, place, and time. Decreased sensation to light touch grossly present bilaterally.  Dermatologic Painful ingrowing nail at medial nail borders of the hallux toe nail right. No other open wounds. No skin lesions.  Orthopedic: Normal joint ROM without pain or crepitus bilaterally. Hammertoe contractures 2 through 5 noted bilaterally.  Mild pain on palpation.  No open ulceration noted at this time. No bony tenderness.   Radiographs: None Assessment:   1. Hammertoe, bilateral   2. Ingrown nail of great toe of right foot    Plan:  Patient was evaluated and treated and all questions answered.  Ingrown Nail, right with underlying nail dystrophy -Patient elects to proceed with minor surgery to remove ingrown toenail removal today. Consent reviewed and signed by patient. -Ingrown nail excised. See procedure note. -Educated on post-procedure care including soaking. Written instructions provided and reviewed. -Patient to follow up in 2 weeks for nail  check.  Hammertoe contracture bilaterally 2 through 5 -I explained the patient the etiology of hammertoe contracture and various treatment options were discussed.  I believe patient will benefit from diabetic shoes given that his A1c is uncontrolled at 7.1 in setting of some signs of neuropathy.  Patient agrees with the plan like to obtain diabetic shoes -He will be scheduled to see right for diabetic shoes  Procedure: Excision of Ingrown Toenail Location: Right hallux medial nail borders. Anesthesia: Lidocaine 1% plain; 1.5 mL and Marcaine 0.5% plain; 1.5 mL, digital block. Skin Prep: Betadine. Dressing: Silvadene; telfa; dry, sterile, compression dressing. Technique: Following skin prep, the toe was exsanguinated and a tourniquet was secured at the base of the toe. The affected nail border was freed, split with a  nail splitter, and excised. Chemical matrixectomy was then performed with phenol and irrigated out with alcohol. The tourniquet was then removed and sterile dressing applied. Disposition: Patient tolerated procedure well. Patient to return in 2 weeks for follow-up.   No follow-ups on file.

## 2020-08-04 ENCOUNTER — Other Ambulatory Visit: Payer: Self-pay | Admitting: Primary Care

## 2020-08-04 DIAGNOSIS — J4531 Mild persistent asthma with (acute) exacerbation: Secondary | ICD-10-CM

## 2020-08-06 ENCOUNTER — Other Ambulatory Visit: Payer: Self-pay

## 2020-08-06 ENCOUNTER — Ambulatory Visit (INDEPENDENT_AMBULATORY_CARE_PROVIDER_SITE_OTHER): Payer: PPO | Admitting: Orthotics

## 2020-08-06 DIAGNOSIS — E0842 Diabetes mellitus due to underlying condition with diabetic polyneuropathy: Secondary | ICD-10-CM

## 2020-08-06 DIAGNOSIS — Z794 Long term (current) use of insulin: Secondary | ICD-10-CM

## 2020-08-13 ENCOUNTER — Ambulatory Visit: Payer: PPO | Admitting: Orthotics

## 2020-08-19 ENCOUNTER — Telehealth: Payer: Self-pay | Admitting: Family Medicine

## 2020-08-19 DIAGNOSIS — E1169 Type 2 diabetes mellitus with other specified complication: Secondary | ICD-10-CM

## 2020-08-19 DIAGNOSIS — I10 Essential (primary) hypertension: Secondary | ICD-10-CM

## 2020-08-19 DIAGNOSIS — E785 Hyperlipidemia, unspecified: Secondary | ICD-10-CM

## 2020-08-19 NOTE — Telephone Encounter (Signed)
Pharmacy called to ask the doctor to update patient's script with Dr. Parks Ranger listed as the PCP.  Patient's previous doctor was on the patient's meds and this needs to be updated.  Please call to discuss at [phone]

## 2020-08-20 MED ORDER — ROSUVASTATIN CALCIUM 5 MG PO TABS: 5.0000 mg | ORAL_TABLET | Freq: Every day | ORAL | 3 refills | Status: DC

## 2020-08-20 MED ORDER — LOSARTAN POTASSIUM 50 MG PO TABS: 50.0000 mg | ORAL_TABLET | Freq: Every day | ORAL | 3 refills | Status: DC

## 2020-08-20 MED ORDER — HYDROCHLOROTHIAZIDE 12.5 MG PO TABS: 12.5000 mg | ORAL_TABLET | Freq: Every day | ORAL | 3 refills | Status: DC

## 2020-08-20 MED ORDER — AMLODIPINE BESYLATE 5 MG PO TABS: 5.0000 mg | ORAL_TABLET | Freq: Every day | ORAL | 3 refills | Status: DC

## 2020-08-20 MED ORDER — METFORMIN HCL ER 500 MG PO TB24: 500.0000 mg | ORAL_TABLET | Freq: Every day | ORAL | 3 refills | Status: DC

## 2020-08-20 MED ORDER — FREESTYLE LANCETS MISC: 3 refills | Status: DC

## 2020-08-20 NOTE — Telephone Encounter (Signed)
Done. Sent all 6. Thank you for calling and organizing all the info.  Nobie Putnam, [facility] 08/20/2020, 4:53 PM

## 2020-08-20 NOTE — Telephone Encounter (Signed)
Can you call the pharmacy requesting this information?  I am not sure specifically which meds need updating, and if they need new orders sent to them or if they just need [place] to verbally update it.  Nobie Putnam, [facility] 08/20/2020, 3:22 PM

## 2020-08-20 NOTE — Telephone Encounter (Signed)
I spoke with the pharmacy and they just need new orders for six prescriptions sent by Dr. Raliegh Ip. Once established with those orders, we can continue to refill with them regularly. The six are as follows:  Hydrochlorothiazide 12.5mg    Rosuvastatin 5mg  Metformin 500mg  ER Losartan 50mg  Amlodipine 5 mg Free Style Light lancets  She said he refills each of there for 90 days periods.   Let me know if you need additional info, or if you just want me to refill these. Thanks

## 2020-08-25 ENCOUNTER — Ambulatory Visit: Payer: PPO | Admitting: Dermatology

## 2020-08-26 ENCOUNTER — Other Ambulatory Visit: Payer: Self-pay | Admitting: Family Medicine

## 2020-08-26 ENCOUNTER — Other Ambulatory Visit: Payer: Self-pay

## 2020-08-26 ENCOUNTER — Ambulatory Visit (INDEPENDENT_AMBULATORY_CARE_PROVIDER_SITE_OTHER): Payer: PPO | Admitting: Unknown Physician Specialty

## 2020-08-26 DIAGNOSIS — Z23 Encounter for immunization: Secondary | ICD-10-CM

## 2020-09-08 NOTE — Telephone Encounter (Signed)
Please advise on patient mychart message  Doctor, my cough has returned and it's pretty persistent. I have what I referred to as a rescue in [place] Albuterol Sulfate HFA Inhalation Aerosol which I haven't been using because I thought it was for wheezing  and shortness of breath which I don't have now that I look at the label I see it's for cough also and the instruction are to use every four hours as needed. As you may remember after looking at my chart I'm scheduled to go to [place] in six weeks. I guess my question is finally should I take the above in haler and how long should I give it to work? Thanks for putting up with my ramblings.

## 2020-09-09 MED ORDER — DOXYCYCLINE HYCLATE 100 MG PO TABS: 100.0000 mg | ORAL_TABLET | Freq: Two times a day (BID) | ORAL | 0 refills | Status: DC

## 2020-09-09 MED ORDER — PREDNISONE 10 MG PO TABS: ORAL_TABLET | ORAL | 1 refills | Status: AC

## 2020-09-09 NOTE — Telephone Encounter (Signed)
Okay if he is taking the Asmanex on a daily basis and he is having breakthrough cough the rescue inhaler is not going to knock the cough off.  It is more like a Band-Aid because it is a rescue inhaler.  I am worried that he has a slight asthma flare  Plan -Given his upcoming trip I recommend a short course of prednisone Please take prednisone 40 mg x1 day, then 30 mg x1 day, then 20 mg x1 day, then 10 mg x1 day, and then 5 mg x1 day and stop  For his travel just to be on the safe side he should take with him additinoal  Take doxycycline 100mg  po twice daily x 5 days; take after meals and avoid sunlight  Please take prednisone 40 mg x1 day, then 30 mg x1 day, then 20 mg x1 day, then 10 mg x1 day, and then 5 mg x1 day and stop

## 2020-09-09 NOTE — Telephone Encounter (Signed)
Please advise on patient mychart message in regards to your last message    I take the Asmanex daily 2 puffs in am and 2 puffs in the pm. I was asking if I should take the rescue inhaler every 4 hours to try and knock out the cough before trip?

## 2020-09-09 NOTE — Telephone Encounter (Signed)
He has cough variant asthma . My recommenddtion was also daily asmanex. Is he taking that? The albuterol is in addition but as rescue. We need good control with asmanex before trip

## 2020-09-15 DIAGNOSIS — M5432 Sciatica, left side: Secondary | ICD-10-CM | POA: Diagnosis not present

## 2020-09-15 DIAGNOSIS — M5136 Other intervertebral disc degeneration, lumbar region: Secondary | ICD-10-CM | POA: Diagnosis not present

## 2020-09-15 DIAGNOSIS — M9905 Segmental and somatic dysfunction of pelvic region: Secondary | ICD-10-CM | POA: Diagnosis not present

## 2020-09-15 DIAGNOSIS — M9903 Segmental and somatic dysfunction of lumbar region: Secondary | ICD-10-CM | POA: Diagnosis not present

## 2020-09-17 DIAGNOSIS — M5136 Other intervertebral disc degeneration, lumbar region: Secondary | ICD-10-CM | POA: Diagnosis not present

## 2020-09-17 DIAGNOSIS — M5432 Sciatica, left side: Secondary | ICD-10-CM | POA: Diagnosis not present

## 2020-09-17 DIAGNOSIS — M9903 Segmental and somatic dysfunction of lumbar region: Secondary | ICD-10-CM | POA: Diagnosis not present

## 2020-09-17 DIAGNOSIS — M9905 Segmental and somatic dysfunction of pelvic region: Secondary | ICD-10-CM | POA: Diagnosis not present

## 2020-09-19 DIAGNOSIS — M5136 Other intervertebral disc degeneration, lumbar region: Secondary | ICD-10-CM | POA: Diagnosis not present

## 2020-09-19 DIAGNOSIS — M5432 Sciatica, left side: Secondary | ICD-10-CM | POA: Diagnosis not present

## 2020-09-19 DIAGNOSIS — M9905 Segmental and somatic dysfunction of pelvic region: Secondary | ICD-10-CM | POA: Diagnosis not present

## 2020-09-19 DIAGNOSIS — M9903 Segmental and somatic dysfunction of lumbar region: Secondary | ICD-10-CM | POA: Diagnosis not present

## 2020-09-22 ENCOUNTER — Other Ambulatory Visit: Payer: Self-pay

## 2020-09-22 ENCOUNTER — Ambulatory Visit: Payer: PPO | Admitting: Dermatology

## 2020-09-22 DIAGNOSIS — D692 Other nonthrombocytopenic purpura: Secondary | ICD-10-CM | POA: Diagnosis not present

## 2020-09-22 DIAGNOSIS — M9903 Segmental and somatic dysfunction of lumbar region: Secondary | ICD-10-CM | POA: Diagnosis not present

## 2020-09-22 DIAGNOSIS — L570 Actinic keratosis: Secondary | ICD-10-CM

## 2020-09-22 DIAGNOSIS — L821 Other seborrheic keratosis: Secondary | ICD-10-CM

## 2020-09-22 DIAGNOSIS — Z85828 Personal history of other malignant neoplasm of skin: Secondary | ICD-10-CM

## 2020-09-22 DIAGNOSIS — L578 Other skin changes due to chronic exposure to nonionizing radiation: Secondary | ICD-10-CM

## 2020-09-22 DIAGNOSIS — M5136 Other intervertebral disc degeneration, lumbar region: Secondary | ICD-10-CM | POA: Diagnosis not present

## 2020-09-22 DIAGNOSIS — L820 Inflamed seborrheic keratosis: Secondary | ICD-10-CM

## 2020-09-22 DIAGNOSIS — M5432 Sciatica, left side: Secondary | ICD-10-CM | POA: Diagnosis not present

## 2020-09-22 DIAGNOSIS — M9905 Segmental and somatic dysfunction of pelvic region: Secondary | ICD-10-CM | POA: Diagnosis not present

## 2020-09-22 NOTE — Progress Notes (Signed)
Follow-Up Visit   Subjective  Isaac Weber is a 75 y.o. male who presents for the following: 6 month follow up (Patient is here for 6 month follow up on isk and aks. He states he noticed some red blotches on left arm he noticed a month ago. He also states he noticed a rough spot on right cheek. ).  He has several other areas to be checked today.  The following portions of the chart were reviewed this encounter and updated as appropriate:  Tobacco  Allergies  Meds  Problems  Med Hx  Surg Hx  Fam Hx     Objective  Well appearing patient in no apparent distress; mood and affect are within normal limits.  A focused examination was performed including left lower leg, bilateral hands and arms, face, scalp . Relevant physical exam findings are noted in the Assessment and Plan.  Objective  bilateral hands x 23 , face and ears x 8, (31): Erythematous thin papules/macules with gritty scale.   Objective  Scalp x 2, (2): Erythematous keratotic or waxy stuck-on papule or plaque.   Assessment & Plan   Seborrheic Keratoses - Stuck-on, waxy, tan-brown papules and plaques  - Discussed benign etiology and prognosis. - Observe - Call for any changes  Purpura - Chronic; persistent and recurrent.  Treatable, but not curable. - Violaceous macules and patches - Benign - Related to trauma, age, sun damage and/or use of blood thinners, chronic use of topical and/or oral steroids - Observe - Can use OTC arnica containing moisturizer such as Dermend Bruise Formula if desired - Call for worsening or other concerns  Severe, Confluent Chronic Actinic Changes with Pre-Cancerous Actinic Keratoses due to cumulative sun exposure/UV radiation exposure over time - Discussed Prescription "Field Treatment" Field treatment involves treatment of an entire area of skin that has confluent Actinic Changes (Sun/ Ultraviolet light damage) and PreCancerous Actinic Keratoses by method of PhotoDynamic Therapy (PDT)  and/or prescription Topical Chemotherapy agents such as 5-fluorouracil, 5-fluorouracil/calcipotriene, and/or imiquimod.  The purpose is to decrease the number of clinically evident and subclinical PreCancerous lesions to prevent progression to development of skin cancer by chemically destroying early precancer changes that may or may not be visible.  It has been shown to reduce the risk of developing skin cancer in the treated area. As a result of treatment, redness, scaling, crusting, and open sores may occur during treatment course. One or more than one of these methods may be used and may have to be used several times to control, suppress and eliminate the PreCancerous changes. Discussed treatment course, expected reaction, and possible side effects. 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   Destruction of lesion - bilateral hands x 23 , face and ears x 8, Complexity: simple   Destruction method: cryotherapy   Informed consent: discussed and consent obtained   Timeout:  patient name, date of birth, surgical site, and procedure verified Lesion destroyed using liquid nitrogen: Yes   Region frozen until ice ball extended beyond lesion: Yes   Outcome: patient tolerated procedure well with no complications   Post-procedure details: wound care instructions given    Inflamed seborrheic keratosis (2) Scalp x 2, Prior to procedure, discussed risks of blister formation, small wound, skin dyspigmentation, or rare scar following cryotherapy.   Destruction of  lesion - Scalp x 2, Complexity: simple   Destruction method: cryotherapy   Informed consent: discussed and consent obtained   Timeout:  patient name, date of birth, surgical site, and procedure verified Lesion destroyed using liquid nitrogen: Yes    Region frozen until ice ball extended beyond lesion: Yes   Outcome: patient tolerated procedure well with no complications   Post-procedure details: wound care instructions given     Actinic Damage - chronic, secondary to cumulative UV radiation exposure/sun exposure over time - diffuse scaly erythematous macules with underlying dyspigmentation - Recommend daily broad spectrum sunscreen SPF 30+ to sun-exposed areas, reapply every 2 hours as needed.  - Call for new or changing lesions.  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  - Call if any new or changing lesions are noted between office visits  Return in about 3 months (around 12/20/2020) for ak and isk follow up .  IRuthell Rummage, CMA, am acting as scribe for Sarina Ser, MD.  Documentation: I have reviewed the above documentation for accuracy and completeness, and I agree with the above.  Sarina Ser, MD

## 2020-09-22 NOTE — Patient Instructions (Signed)
Cryotherapy Aftercare  . Wash gently with soap and water everyday.   . Apply Vaseline and Band-Aid daily until healed.  

## 2020-09-23 ENCOUNTER — Encounter: Payer: Self-pay | Admitting: Dermatology

## 2020-09-24 ENCOUNTER — Ambulatory Visit (INDEPENDENT_AMBULATORY_CARE_PROVIDER_SITE_OTHER): Payer: PPO | Admitting: Podiatry

## 2020-09-24 ENCOUNTER — Other Ambulatory Visit: Payer: Self-pay

## 2020-09-24 DIAGNOSIS — M2041 Other hammer toe(s) (acquired), right foot: Secondary | ICD-10-CM | POA: Diagnosis not present

## 2020-09-24 DIAGNOSIS — M5432 Sciatica, left side: Secondary | ICD-10-CM | POA: Diagnosis not present

## 2020-09-24 DIAGNOSIS — E1140 Type 2 diabetes mellitus with diabetic neuropathy, unspecified: Secondary | ICD-10-CM | POA: Diagnosis not present

## 2020-09-24 DIAGNOSIS — M2042 Other hammer toe(s) (acquired), left foot: Secondary | ICD-10-CM

## 2020-09-24 DIAGNOSIS — M5136 Other intervertebral disc degeneration, lumbar region: Secondary | ICD-10-CM | POA: Diagnosis not present

## 2020-09-24 DIAGNOSIS — M9903 Segmental and somatic dysfunction of lumbar region: Secondary | ICD-10-CM | POA: Diagnosis not present

## 2020-09-24 DIAGNOSIS — Z794 Long term (current) use of insulin: Secondary | ICD-10-CM

## 2020-09-24 DIAGNOSIS — M9905 Segmental and somatic dysfunction of pelvic region: Secondary | ICD-10-CM | POA: Diagnosis not present

## 2020-09-24 DIAGNOSIS — E0842 Diabetes mellitus due to underlying condition with diabetic polyneuropathy: Secondary | ICD-10-CM

## 2020-09-24 NOTE — Progress Notes (Signed)
The patient presented in the office today to pick up diabetic shoes and 3 pair diabetic custom inserts.  1 pair of inserts were put in the shoes and the shoes were fitted to the patient. The patient states they are comfortable and free of defect. He was satisfied with the fit of the shoe. Instructions for break in and wear were dispensed. The patient signed the delivery documentation and the break in instruction form.  If any questions or concerns arise, he is instructed to call. Otherwise he will be seen back for his next scheduled appointment.

## 2020-09-26 ENCOUNTER — Encounter: Payer: Self-pay | Admitting: Podiatry

## 2020-09-29 DIAGNOSIS — M5432 Sciatica, left side: Secondary | ICD-10-CM | POA: Diagnosis not present

## 2020-09-29 DIAGNOSIS — M5136 Other intervertebral disc degeneration, lumbar region: Secondary | ICD-10-CM | POA: Diagnosis not present

## 2020-09-29 DIAGNOSIS — M9905 Segmental and somatic dysfunction of pelvic region: Secondary | ICD-10-CM | POA: Diagnosis not present

## 2020-09-29 DIAGNOSIS — M9903 Segmental and somatic dysfunction of lumbar region: Secondary | ICD-10-CM | POA: Diagnosis not present

## 2020-10-01 ENCOUNTER — Ambulatory Visit: Payer: PPO | Admitting: Urology

## 2020-10-01 ENCOUNTER — Other Ambulatory Visit: Payer: Self-pay

## 2020-10-01 ENCOUNTER — Encounter: Payer: Self-pay | Admitting: Urology

## 2020-10-01 VITALS — BP 136/74 | HR 85 | Ht 69.0 in | Wt 170.0 lb

## 2020-10-01 DIAGNOSIS — Z125 Encounter for screening for malignant neoplasm of prostate: Secondary | ICD-10-CM | POA: Diagnosis not present

## 2020-10-01 DIAGNOSIS — R351 Nocturia: Secondary | ICD-10-CM

## 2020-10-01 DIAGNOSIS — N401 Enlarged prostate with lower urinary tract symptoms: Secondary | ICD-10-CM | POA: Diagnosis not present

## 2020-10-01 LAB — BLADDER SCAN AMB NON-IMAGING: Scan Result: 157

## 2020-10-01 MED ORDER — TAMSULOSIN HCL 0.4 MG PO CAPS
0.4000 mg | ORAL_CAPSULE | Freq: Every day | ORAL | 3 refills | Status: DC
Start: 2020-10-01 — End: 2022-08-10

## 2020-10-01 NOTE — Telephone Encounter (Signed)
Pt called for instruction. Please advise. Call back number is [phone].

## 2020-10-01 NOTE — Patient Instructions (Signed)
Minimize fluids 3 to 4 hours before bed, and try to urinate twice within 30 minutes of bedtime to minimize getting up overnight to be.  If your urinary symptoms worsen, would recommend cystoscopy to look in the bladder to evaluate for any scar tissue or prostate regrowth.  Prostate Cancer Screening  Prostate cancer screening is a test that is done to check for the presence of prostate cancer in men. The prostate gland is a walnut-sized gland that is located below the bladder and in front of the rectum in males. The function of the prostate is to add fluid to semen during ejaculation. Prostate cancer is the second most common type of cancer in men. Who should have prostate cancer screening?  Screening recommendations vary based on age and other risk factors. Screening is recommended if:  You are older than age 71. If you are age 16-69, talk with your health care provider about your need for screening and how often screening should be done. Because most prostate cancers are slow growing and will not cause death, screening is generally reserved in this age group for men who have a 10-15-year life expectancy.  You are younger than age 39, and you have these risk factors: ? Being a black male or a male of African descent. ? Having a father, brother, or uncle who has been diagnosed with prostate cancer. The risk is higher if your family member's cancer occurred at an early age. Screening is not recommended if:  You are younger than age 51.  You are between the ages of 67 and 82 and you have no risk factors.  You are 61 years of age or older. At this age, the risks that screening can cause are greater than the benefits that it may provide. If you are at high risk for prostate cancer, your health care provider may recommend that you have screenings more often or that you start screening at a younger age. How is screening for prostate cancer done? The recommended prostate cancer screening test is a  blood test called the prostate-specific antigen (PSA) test. PSA is a protein that is made in the prostate. As you age, your prostate naturally produces more PSA. Abnormally high PSA levels may be caused by:  Prostate cancer.  An enlarged prostate that is not caused by cancer (benign prostatic hyperplasia, BPH). This condition is very common in older men.  A prostate gland infection (prostatitis). Depending on the PSA results, you may need more tests, such as:  A physical exam to check the size of your prostate gland.  Blood and imaging tests.  A procedure to remove tissue samples from your prostate gland for testing (biopsy). What are the benefits of prostate cancer screening?  Screening can help to identify cancer at an early stage, before symptoms start and when the cancer can be treated more easily.  There is a small chance that screening may lower your risk of dying from prostate cancer. The chance is small because prostate cancer is a slow-growing cancer, and most men with prostate cancer die from a different cause. What are the risks of prostate cancer screening? The main risk of prostate cancer screening is diagnosing and treating prostate cancer that would never have caused any symptoms or problems. This is called overdiagnosisand overtreatment. PSA screening cannot tell you if your PSA is high due to cancer or a different cause. A prostate biopsy is the only procedure to diagnose prostate cancer. Even the results of a biopsy may not  tell you if your cancer needs to be treated. Slow-growing prostate cancer may not need any treatment other than monitoring, so diagnosing and treating it may cause unnecessary stress or other side effects. A prostate biopsy may also cause:  Infection or fever.  A false negative. This is a result that shows that you do not have prostate cancer when you actually do have prostate cancer. Questions to ask your health care provider  When should I start  prostate cancer screening?  What is my risk for prostate cancer?  How often do I need screening?  What type of screening tests do I need?  How do I get my test results?  What do my results mean?  Do I need treatment? Where to find more information  The American Cancer Society: www.cancer.org  American Urological Association: www.auanet.org Contact a health care provider if:  You have difficulty urinating.  You have pain when you urinate or ejaculate.  You have blood in your urine or semen.  You have pain in your back or in the area of your prostate. Summary  Prostate cancer is a common type of cancer in men. The prostate gland is located below the bladder and in front of the rectum. This gland adds fluid to semen during ejaculation.  Prostate cancer screening may identify cancer at an early stage, when the cancer can be treated more easily.  The prostate-specific antigen (PSA) test is the recommended screening test for prostate cancer.  Discuss the risks and benefits of prostate cancer screening with your health care provider. If you are age 8 or older, the risks that screening can cause are greater than the benefits that it may provide. This information is not intended to replace advice given to you by your health care provider. Make sure you discuss any questions you have with your health care provider. Document Revised: 11/02/2019 Document Reviewed: 02/22/2019 Elsevier Patient Education  [place].

## 2020-10-01 NOTE — Progress Notes (Signed)
10/01/20 9:47 AM   Isaac Weber 04-20-1946 [id number]  CC: PSA screening, BPH  HPI: I saw Isaac Weber for the above issues.  He was previously followed by [facility] in [place].  He was recently found to have a PSA of 4.07 was sent to urology for further evaluation.  Regarding his history of BPH, he had a TUNA procedure, followed by a greenlight laser PVP.  He felt these improved his symptoms temporarily, but they have slightly worsened since that time.  His primary complaint is nocturia 3 times per night, and urinary frequency during the day.  IPSS score today is 7, with quality of life mixed, and PVR is mildly elevated at 157 mL.  Urinalysis today is pending.  He denies any gross hematuria, dysuria, or incontinence.  He reportedly has undergone a negative prostate biopsy as well in the past.  PSA in March 2021 was 2.7.  He takes his diuretic in the morning.  He remains on Flomax which he feels improves his urinary symptoms.  Overall, he is minimally bothered by his urinary symptoms.  He drinks primarily water during the day, and beer in the evening.   PMH: Past Medical History:  Diagnosis Date  . Abnormal chest CT   . Allergic rhinitis, unspecified   . Anemia   . Aortic atherosclerosis ([place])   . BPH (benign prostatic hyperplasia)   . COPD (chronic obstructive pulmonary disease) ([place])   . Coronary artery disease   . Cough variant asthma   . Glaucoma   . Hypertension   . Other chronic pain   . Pancreatic lesion   . Pure hypercholesterolemia, unspecified   . 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 Tx: EDC  . Squamous cell carcinoma of skin 02/21/2020   Right neck proximal mandible. WD SCC, ulcerated. [facility] 04/29/2020    Surgical History: Past Surgical History:  Procedure Laterality Date  . BUNIONECTOMY Left 2003   hammer toe as well  . BUNIONECTOMY WITH HAMMERTOE RECONSTRUCTION Left 2013   repeat  .  CERVICAL DISC SURGERY    . GREEN LIGHT LASER TURP (TRANSURETHRAL RESECTION OF PROSTATE  2010   laser, shrink prostate  . KNEE ARTHROSCOPY Right 1991  . LEG SURGERY Left    BENIGN BONE TUMOR  . LUMBAR [place] SURGERY  2010   discectomy  . [place] SURGERY  2011  . PROSTATE SURGERY  2002   shrink prostate  . ROTATOR CUFF REPAIR Left 1999   debride, remove bonespur  . ROTATOR CUFF REPAIR Right 1996    Family History: Family History  Problem Relation Age of Onset  . Heart disease Mother 9  . Emphysema Father 11  . Heart disease Brother 79    Social History:  reports that he quit smoking about 49 years ago. His smoking use included cigarettes. He has a 78.00 pack-year smoking history. He has never used smokeless tobacco. He reports current alcohol use of about 2.0 standard drinks of alcohol per week. He reports that he does not use drugs.  Physical Exam: BP 136/74   Pulse 85   Ht 5\' 9"  (1.753 m)   Wt 170 lb (77.1 kg)   BMI 25.10 kg/m    Constitutional:  Alert and oriented, No acute distress. Cardiovascular: No clubbing, cyanosis, or edema. Respiratory: Normal respiratory effort, no increased work of breathing. GI: Abdomen is soft, nontender, nondistended, no abdominal masses GU: Uncircumcised phallus, subtle erythema at  glans, no lesions, testicles 20 cc and descended bilaterally DRE: 60 g, smooth, no nodules or masses  Laboratory Data: Reviewed, see HPI  Pertinent Imaging: None to review  Assessment & Plan:   75 year old male with a PSA value of 4.07 which is within the normal range for his age.  We reviewed the AUA guidelines that do not recommend routine screening in men over age 53, as well as that a normal PSA for his age group would be less than 6.5.  He also has very mild urinary symptoms of frequency during the day and nocturia 2-3 times a night that is minimally bothersome.  He has a history of a TUNA prostate procedure as well as a greenlight PVP.  PVR is  borderline today at 157 mL, and urinalysis is benign.  I recommended considering cystoscopy if he has worsening urinary symptoms to evaluate for stricture, bladder neck contracture, or prostatic regrowth, but he is minimally bothered at this time and would like to hold off.  Return precautions discussed.  RTC 1 year for IPSS and PVR Discontinue PSA screening per AUA guidelines Consider cystoscopy in the future if worsening urinary symptoms  Nickolas Madrid, MD 10/01/2020  [facility] [address] [place], [place] [address] [phone]

## 2020-10-02 LAB — URINALYSIS, COMPLETE
Bilirubin, UA: NEGATIVE
Glucose, UA: NEGATIVE
Ketones, UA: NEGATIVE
Leukocytes,UA: NEGATIVE
Nitrite, UA: NEGATIVE
Protein,UA: NEGATIVE
RBC, UA: NEGATIVE
Specific Gravity, UA: 1.02 (ref 1.005–1.030)
Urobilinogen, Ur: 0.2 mg/dL (ref 0.2–1.0)
pH, UA: 7.5 (ref 5.0–7.5)

## 2020-10-02 LAB — MICROSCOPIC EXAMINATION
Bacteria, UA: NONE SEEN
RBC, Urine: NONE SEEN /hpf (ref 0–2)

## 2020-10-07 ENCOUNTER — Encounter: Payer: Self-pay | Admitting: Podiatry

## 2020-10-07 ENCOUNTER — Other Ambulatory Visit: Payer: Self-pay

## 2020-10-07 ENCOUNTER — Ambulatory Visit: Payer: PPO | Admitting: Podiatry

## 2020-10-07 DIAGNOSIS — L600 Ingrowing nail: Secondary | ICD-10-CM

## 2020-10-07 DIAGNOSIS — L03031 Cellulitis of right toe: Secondary | ICD-10-CM | POA: Diagnosis not present

## 2020-10-07 MED ORDER — DOXYCYCLINE HYCLATE 100 MG PO TABS: 100.0000 mg | ORAL_TABLET | Freq: Two times a day (BID) | ORAL | 0 refills | Status: DC

## 2020-10-07 NOTE — Progress Notes (Signed)
Subjective:  Patient ID: Isaac Weber, male    DOB: 06/27/1946,  MRN: [id number]  Chief Complaint  Patient presents with  . Nail Problem    "my right toe still red where he removed the ingrown and Im leaving for [place] next week and I just want it checked."     75 y.o. male presents with the above complaint.  Patient presents with a follow-up of ingrown to the right hallux medial border.  Patient states is not painful however there is some redness around the ingrown.  Is going to a trip to [place].  He wants to make sure that everything is fine.  He is a diabetic with last A1c of 7.0.   Review of Systems: Negative except as noted in the HPI. Denies N/V/F/Ch.  Past Medical History:  Diagnosis Date  . Abnormal chest CT   . Allergic rhinitis, unspecified   . Anemia   . Aortic atherosclerosis ([place])   . BPH (benign prostatic hyperplasia)   . COPD (chronic obstructive pulmonary disease) ([place])   . Coronary artery disease   . Cough variant asthma   . Glaucoma   . Hypertension   . Other chronic pain   . Pancreatic lesion   . Pure hypercholesterolemia, unspecified   . 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 Tx: EDC  . Squamous cell carcinoma of skin 02/21/2020   Right neck proximal mandible. WD SCC, ulcerated. [facility] 04/29/2020    Current Outpatient Medications:  .  doxycycline (VIBRA-TABS) 100 MG tablet, Take 1 tablet (100 mg total) by mouth 2 (two) times daily., Disp: 20 tablet, Rfl: 0 .  albuterol (VENTOLIN HFA) 108 (90 Base) MCG/ACT inhaler, Inhale 1-2 puffs into the lungs every 4 (four) hours as needed for wheezing or shortness of breath (cough)., Disp: 1 each, Rfl: 2 .  amLODipine (NORVASC) 5 MG tablet, Take 1 tablet (5 mg total) by mouth daily., Disp: 90 tablet, Rfl: 3 .  ASMANEX HFA 200 MCG/ACT AERO, INHALE TWO PUFFS into THE lungs TWICE DAILY AS NEEDED, Disp: 13 g, Rfl: 5 .  Calcium Carb-Cholecalciferol (CALCIUM +  D3 PO), Take by mouth., Disp: , Rfl:  .  diclofenac Sodium (VOLTAREN) 1 % GEL, Apply topically 4 (four) times daily., Disp: , Rfl:  .  ferrous sulfate 325 (65 FE) MG tablet, Take 325 mg by mouth daily with breakfast., Disp: , Rfl:  .  fluticasone (FLONASE) 50 MCG/ACT nasal spray, Place 1 spray into both nostrils daily., Disp: 16 g, Rfl: 4 .  FREESTYLE LITE test strip, , Disp: , Rfl:  .  Ginger, Zingiber officinalis, (GINGER PO), Take by mouth., Disp: , Rfl:  .  Glucosamine-Chondroitin (GLUCOSAMINE CHONDR COMPLEX PO), Take by mouth 2 (two) times daily. , Disp: , Rfl:  .  hydrochlorothiazide (HYDRODIURIL) 12.5 MG tablet, Take 1 tablet (12.5 mg total) by mouth daily., Disp: 90 tablet, Rfl: 3 .  ibuprofen (ADVIL,MOTRIN) 200 MG tablet, Take 200-800 mg by mouth every 6 (six) hours as needed for fever, headache, mild pain, moderate pain or cramping. , Disp: , Rfl:  .  ketoconazole (NIZORAL) 2 % shampoo, APPLY SHAMPOO AND LATHER 3 TIMES WEEKLY. LEAVE ON FOR 5-8 MINUTES RINSE WELL, Disp: 120 mL, Rfl: 3 .  Lancets (FREESTYLE) lancets, Use to test your blood sugar once a day, Disp: 100 each, Rfl: 3 .  loratadine (CLARITIN) 10 MG tablet, Take 10 mg by mouth daily., Disp: , Rfl:  .  losartan (COZAAR) 50 MG tablet, Take 1 tablet (50 mg total) by mouth daily., Disp: 90 tablet, Rfl: 3 .  metFORMIN (GLUCOPHAGE-XR) 500 MG 24 hr tablet, Take 1 tablet (500 mg total) by mouth at bedtime., Disp: 90 tablet, Rfl: 3 .  Multiple Vitamin (MULTIVITAMIN WITH MINERALS) TABS tablet, Take 1 tablet by mouth daily., Disp: , Rfl:  .  predniSONE (DELTASONE) 10 MG tablet, 4 tabs for 2 days, then 3 tabs for 2 days, 2 tabs for 2 days, then 1 tab for 2 days, then stop, Disp: 20 tablet, Rfl: 0 .  rosuvastatin (CRESTOR) 5 MG tablet, Take 1 tablet (5 mg total) by mouth daily., Disp: 90 tablet, Rfl: 3 .  tamsulosin (FLOMAX) 0.4 MG CAPS capsule, Take 1 capsule (0.4 mg total) by mouth daily., Disp: 90 capsule, Rfl: 3 .  terbinafine (LAMISIL)  250 MG tablet, Take 1 tablet (250 mg total) by mouth daily., Disp: 30 tablet, Rfl: 2  Social History   Tobacco Use  Smoking Status Former Smoker  . Packs/day: 3.00  . Years: 26.00  . Pack years: 78.00  . Types: Cigarettes  . Quit date: 09/17/1971  . Years since quitting: 49.0  Smokeless Tobacco Never Used    Allergies  Allergen Reactions  . Ciprofloxacin Diarrhea and Nausea And Vomiting  . Duloxetine Other (See Comments)    Severe fatique and constipation.  . Morphine And Related Diarrhea and Nausea And Vomiting   Objective:  There were no vitals filed for this visit. There is no height or weight on file to calculate BMI. Constitutional Well developed. Well nourished.  Vascular Dorsalis pedis pulses palpable bilaterally. Posterior tibial pulses palpable bilaterally. Capillary refill normal to all digits.  No cyanosis or clubbing noted. Pedal hair growth normal.  Neurologic Normal speech. Oriented to person, place, and time. Decreased sensation to light touch grossly present bilaterally.  Dermatologic Painful ingrowing nail at medial nail borders of the hallux toe nail right. No other open wounds.  Mild paronychia/redness noted No skin lesions.  Orthopedic: Normal joint ROM without pain or crepitus bilaterally. Hammertoe contractures 2 through 5 noted bilaterally.  Mild pain on palpation.  No open ulceration noted at this time. No bony tenderness.   Radiographs: None Assessment:   1. Paronychia of toe of right foot due to ingrown toenail    Plan:  Patient was evaluated and treated and all questions answered.  Ingrown Nail, right with mild paronychia -I explained to patient the etiology of paronychia versus treatment options were discussed.  It is very mild in nature and is going away for international trip to [place].  I discussed that he will just benefit from doxycycline.  If he continues to hurt after the trip and come back and see me and may need to redo the  ingrown as it seems like there might be a component of regrowing back.  He states understanding.   Hammertoe contracture bilaterally 2 through 5 -I explained the patient the etiology of hammertoe contracture and various treatment options were discussed.  I believe patient will benefit from diabetic shoes given that his A1c is uncontrolled at 7.1 in setting of some signs of neuropathy.  Patient agrees with the plan like to obtain diabetic shoes -He has obtained diabetic shoes and is functioning well in them   No follow-ups on file.

## 2020-10-30 LAB — HM DIABETES EYE EXAM

## 2020-11-29 DIAGNOSIS — K862 Cyst of pancreas: Secondary | ICD-10-CM

## 2020-12-02 NOTE — Addendum Note (Signed)
Addended by: Olin Hauser on: 12/02/2020 08:06 AM   Modules accepted: Orders

## 2020-12-05 ENCOUNTER — Ambulatory Visit
Admission: RE | Admit: 2020-12-05 | Discharge: 2020-12-05 | Disposition: A | Discharge status: Home / Self Care | Payer: PPO | Source: Ambulatory Visit | Attending: Family Medicine | Admitting: Family Medicine

## 2020-12-05 ENCOUNTER — Other Ambulatory Visit: Payer: Self-pay

## 2020-12-05 ENCOUNTER — Other Ambulatory Visit: Payer: Self-pay | Admitting: Family Medicine

## 2020-12-05 DIAGNOSIS — K8020 Calculus of gallbladder without cholecystitis without obstruction: Secondary | ICD-10-CM | POA: Diagnosis not present

## 2020-12-05 DIAGNOSIS — N281 Cyst of kidney, acquired: Secondary | ICD-10-CM | POA: Diagnosis not present

## 2020-12-05 DIAGNOSIS — K862 Cyst of pancreas: Secondary | ICD-10-CM

## 2020-12-05 MED ORDER — GADOBUTROL 1 MMOL/ML IV SOLN
7.0000 mL | Freq: Once | INTRAVENOUS | Status: AC | PRN
  Administered 2020-12-05: 7 mL via INTRAVENOUS

## 2020-12-15 ENCOUNTER — Other Ambulatory Visit: Payer: Self-pay

## 2020-12-15 ENCOUNTER — Encounter: Payer: Self-pay | Admitting: Family Medicine

## 2020-12-15 ENCOUNTER — Ambulatory Visit (INDEPENDENT_AMBULATORY_CARE_PROVIDER_SITE_OTHER): Payer: PPO | Admitting: Family Medicine

## 2020-12-15 ENCOUNTER — Other Ambulatory Visit: Payer: Self-pay | Admitting: Family Medicine

## 2020-12-15 VITALS — BP 120/60 | HR 86 | Resp 16 | Ht 69.0 in | Wt 165.8 lb

## 2020-12-15 DIAGNOSIS — E1169 Type 2 diabetes mellitus with other specified complication: Secondary | ICD-10-CM

## 2020-12-15 DIAGNOSIS — I10 Essential (primary) hypertension: Secondary | ICD-10-CM | POA: Diagnosis not present

## 2020-12-15 DIAGNOSIS — E785 Hyperlipidemia, unspecified: Secondary | ICD-10-CM

## 2020-12-15 DIAGNOSIS — J4521 Mild intermittent asthma with (acute) exacerbation: Secondary | ICD-10-CM | POA: Diagnosis not present

## 2020-12-15 DIAGNOSIS — N401 Enlarged prostate with lower urinary tract symptoms: Secondary | ICD-10-CM

## 2020-12-15 DIAGNOSIS — Z0000 Encounter for general adult medical examination without abnormal findings: Secondary | ICD-10-CM

## 2020-12-15 DIAGNOSIS — K862 Cyst of pancreas: Secondary | ICD-10-CM

## 2020-12-15 DIAGNOSIS — R351 Nocturia: Secondary | ICD-10-CM

## 2020-12-15 LAB — POCT GLYCOSYLATED HEMOGLOBIN (HGB A1C): Hemoglobin A1C: 6.8 % — AB (ref 4.0–5.6)

## 2020-12-15 NOTE — Assessment & Plan Note (Signed)
A1c 6.8 controlled Complications - hyperlipidemia increases risk of future cardiovascular complications   Plan:  1. Continue current therapy - Metformin XR 500mg  daily 2. Encourage improved lifestyle - low carb, low sugar diet, reduce portion size, continue improving regular exercise 3. Check CBG, bring log to next visit for review 4. Continue ARB, Statin

## 2020-12-15 NOTE — Assessment & Plan Note (Signed)
Controlled - Home BP readings reviewed, normal at home, elevated here in office usually  No known complications     Plan:  1. Continue current BP regimen - Losartan-HCTZ 50-12.5mg daily, Amlodipine 5mg daily 2. Encourage improved lifestyle - low sodium diet, regular exercise 3. Continue monitor BP outside office, bring readings to next visit, if persistently >140/90 or new symptoms notify office sooner 

## 2020-12-15 NOTE — Patient Instructions (Addendum)
Thank you for coming to the office today.  I would agree lungs sound like an asthma flare  Start the Prednisone you have 10mg  tabs, each - take 4 of them = 40mg  for 3 days, then down to 3 pills = 30mg  for 1-2 days, then down to 2 pills = 20mg , for 1 day then 1 pill for 10mg  for one day. Overall approximately 5-6 days.  MR Abdomen scan was good, up to 1 year now. Can repeat.  Use rescue albuterol inhaler as needed for asthma.  Recent Labs    06/02/20 0852 12/15/20 0900  HGBA1C 7.0* 6.8*     DUE for FASTING BLOOD WORK (no food or drink after midnight before the lab appointment, only water or coffee without cream/sugar on the morning of)  SCHEDULE "Lab Only" visit in the morning at the clinic for lab draw in 6 MONTHS   - Make sure Lab Only appointment is at about 1 week before your next appointment, so that results will be available  For Lab Results, once available within 2-3 days of blood draw, you can can log in to MyChart online to view your results and a brief explanation. Also, we can discuss results at next follow-up visit.    Please schedule a Follow-up Appointment to: Return in about 6 months (around 06/17/2021) for 6 month fasting lab only then 1 week later Annual Physical.  If you have any other questions or concerns, please feel free to call the office or send a message through [place]. You may also schedule an earlier appointment if necessary.  Additionally, you may be receiving a survey about your experience at our office within a few days to 1 week by e-mail or mail. We value your feedback.  Nobie Putnam, DO [place]

## 2020-12-15 NOTE — Progress Notes (Signed)
Subjective:    Patient ID: Isaac Weber, male    DOB: 10-26-1945, 75 y.o.   MRN: [id number]  Isaac Weber is a 75 y.o. male presenting on 12/15/2020 for Follow-up (6 month MR Results, PSA and DM)   HPI   CHRONIC DM, Type 2 Hyperlipidemia Home CBG readings avg 139. Today A1c due. Meds:Metformin XR 500mg  daily at bedtime Reports good compliance. Tolerating well w/o side-effects Currently on ARB, Statin Denies hypoglycemia, polyuria, visual changes, numbness or tingling.  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 He gets BP checked every 8 weeks with donating blood. Current Meds -Losartan-HCTZ 50-12.5mg  daily, Amlodipine 5mg  daily Reports good compliance, took meds today. Tolerating well, w/o complaints. Denies CP, dyspnea, HA, edema, dizziness / lightheadedness  History of Iron Deficiency Donates blood every 8 weeks. He takes iron supplement.  Osteoarthritis, bilateral knees, lumbar spine Chronic problem, multiple joints, episodic pain and flares He has has seen chiropractor as well. He said he tried Relief Factor, for 7 weeks limited effect. - Uses Voltaren topical PRN with good results - Not effective Tylenol Taking Motrin 200mg  x 4 = 800mg  PRN only on day of golf. 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    Depression screen [facility] 2/9 12/15/2020 06/09/2020 03/18/2020  Decreased Interest 0 0 0  Down, Depressed, Hopeless 0 0 0  PHQ - 2 Score 0 0 0  Altered sleeping 0 - -  Tired, decreased energy 0 - -  Change in appetite 0 - -  Feeling bad or failure about yourself  0 - -  Trouble concentrating 0 - -  Moving slowly or fidgety/restless 0 - -  Suicidal thoughts 0 - -  PHQ-9 Score 0 - -  Difficult doing work/chores Not difficult at all - -    Social History   Tobacco Use  . Smoking status: Former Smoker    Packs/day: 3.00    Years: 26.00    Pack years: 78.00    Types: Cigarettes    Quit date: 09/17/1971    Years since quitting: 49.2  . Smokeless tobacco: Never Used  Vaping Use  . Vaping Use: Never used  Substance Use Topics  . Alcohol use: Yes    Alcohol/week: 2.0 standard drinks    Types: 2 Standard drinks or equivalent per week  . Drug use: No    Review of Systems Per HPI unless specifically indicated above     Objective:    BP 120/60 (BP Location: Left Arm, Patient Position: Sitting, Cuff Size: Normal)   Pulse 86   Resp 16   Ht 5\' 9"  (1.753 m)   Wt 165 lb 12.8 oz (75.2 kg)   BMI 24.48 kg/m   Wt Readings from Last 3 Encounters:  12/15/20 165 lb 12.8 oz (75.2 kg)  10/01/20 170 lb (77.1 kg)  07/31/20 170 lb 9.6 oz (77.4 kg)    Physical Exam Vitals and nursing note reviewed.  Constitutional:      General: He is not in acute distress.    Appearance: He is well-developed. He is not diaphoretic.     Comments: Well-appearing, comfortable, cooperative  HENT:     Head: Normocephalic and atraumatic.  Eyes:     General:        Right eye: No discharge.        Left eye: No discharge.     Conjunctiva/sclera: Conjunctivae normal.  Neck:     Thyroid: No thyromegaly.  Cardiovascular:     Rate and Rhythm: Normal rate and regular rhythm.     Heart sounds: Normal heart sounds. No murmur heard.   Pulmonary:      Effort: Pulmonary effort is normal. No respiratory distress.     Breath sounds: Wheezing present. No rales.  Musculoskeletal:        General: Normal range of motion.     Cervical back: Normal range of motion and neck supple.  Lymphadenopathy:     Cervical: No cervical adenopathy.  Skin:    General: Skin is warm and dry.     Findings: No erythema or rash.  Neurological:     Mental Status: He is alert and oriented to person, place, and time.  Psychiatric:        Behavior: Behavior normal.     Comments: Well groomed, good eye contact, normal speech and thoughts      I have personally reviewed the radiology report from 12/05/20 on MR Abdomen.  CLINICAL DATA:  Follow-up pancreatic cysts/pseudocysts. No known injury, prior relevant surgery or malignancy. Asymptomatic.  EXAM: MRI ABDOMEN WITHOUT AND WITH CONTRAST  TECHNIQUE: Multiplanar multisequence MR imaging of the abdomen was performed both before and after the administration of intravenous contrast.  CONTRAST:  5mL GADAVIST GADOBUTROL 1 MMOL/ML IV SOLN  COMPARISON:  Abdominal MRI 05/16/2020, 11/13/2019 and 04/24/2019.  FINDINGS: Despite efforts by the technologist and patient, mild to moderate motion artifact is present on today's exam and could not be eliminated. This reduces exam sensitivity and specificity.  Lower chest:  The visualized lower chest appears unremarkable.  Hepatobiliary: Small T2 hyperintense hepatic lesions shown to represent cysts and hemangiomas on the prior study are unchanged in size. These are not optimally seen on today's postcontrast images due to motion artifact. No enlarging lesions identified. Small gallstones. No evidence gallbladder wall thickening or biliary dilatation.  Pancreas: The pancreas is atrophied without ductal dilatation or surrounding inflammation. Multiple cystic pancreatic lesions are again noted, suboptimally evaluated due to motion, although grossly unchanged from  the previous study. The largest lesion in the pancreatic tail measures 1.9 x 1.3 cm on image 18/11, which is not significantly changed allowing for slight measurement differences related to the motion. No associated abnormal enhancement following contrast.  Spleen: Stable 14 mm cyst anteriorly. No suspicious lesion or splenomegaly.  Adrenals/Urinary Tract: Both adrenal glands appear normal. Stable renal cortical and parapelvic cysts bilaterally. No evidence of enhancing renal mass or hydronephrosis.  Stomach/Bowel: The stomach appears unremarkable for its degree of distension. No evidence of bowel wall thickening, distention or surrounding inflammatory change.  Vascular/Lymphatic: There are no enlarged abdominal lymph nodes. No significant vascular findings.  Other: No evidence of abdominal wall hernia or ascites.  Musculoskeletal: No acute or significant osseous findings. Mild lumbar spondylosis.  IMPRESSION: 1. The multiple small pancreatic cystic lesions are grossly stable, without abnormal enhancement or  pancreatic ductal dilatation. 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 Per consensus guidelines, follow-up in 1 year recommended. Given the motion limitations of MRI for this patient, consider contrast enhanced CT for the imaging modality. This recommendation follows ACR consensus guidelines: Management of Incidental Pancreatic Cysts: A White Paper of the ACR Incidental Findings Committee. [place] 1962;22:979-892. 2. No acute abdominal findings. 3. Grossly stable additional incidental findings including hepatic cysts/hemangiomas, renal cysts and cholelithiasis.   Electronically Signed   By: Richardean Sale M.D.   On: 12/07/2020 11:08  Results for orders placed or performed in visit on 12/15/20  POCT HgB A1C  Result Value Ref Range   Hemoglobin A1C 6.8 (A) 4.0 - 5.6 %   HbA1c POC (<> result, manual entry)     HbA1c, POC (prediabetic  range)     HbA1c, POC (controlled diabetic range)        Assessment & Plan:   Problem List Items Addressed This Visit    Type 2 diabetes mellitus with other specified complication ([place]) - Primary    A1c 6.8 controlled Complications - hyperlipidemia increases risk of future cardiovascular complications   Plan:  1. Continue current therapy - Metformin XR 500mg  daily 2. Encourage improved lifestyle - low carb, low sugar diet, reduce portion size, continue improving regular exercise 3. Check CBG, bring log to next visit for review 4. Continue ARB, Statin      Relevant Orders   POCT HgB A1C (Completed)   Essential hypertension    Controlled - Home BP readings reviewed, normal at home, elevated here in office usually  No known complications     Plan:  1. Continue current BP regimen - Losartan-HCTZ 50-12.5mg  daily, Amlodipine 5mg  daily 2. Encourage improved lifestyle - low sodium diet, regular exercise 3. Continue monitor BP outside office, bring readings to next visit, if persistently >140/90 or new symptoms notify office sooner       Other Visit Diagnoses    Mild intermittent asthma with exacerbation       Cyst of pancreas          #Asthma Mild acute exacerbation Likely allergy component as well Trial on Prednisone, existing tablets, reviewed dosing. Albuterol PRN   #Pancreatic Cyst MRI Abdomen shows stable cyst for another 6 months, now repeat imaging in 1 year is recommended by radiology, discussed we will reconsider next year  No orders of the defined types were placed in this encounter.     Follow up plan: Return in about 6 months (around 06/17/2021) for 6 month fasting lab only then 1 week later Annual Physical.  Future labs ordered for 06/15/21 - Note discontinued PSA testing per Urology Dr Diamantina [place].  Nobie Putnam, [facility] 12/15/2020, 9:02 AM

## 2020-12-18 ENCOUNTER — Other Ambulatory Visit: Payer: Self-pay

## 2020-12-18 ENCOUNTER — Ambulatory Visit: Payer: PPO | Admitting: Dermatology

## 2020-12-18 DIAGNOSIS — L821 Other seborrheic keratosis: Secondary | ICD-10-CM

## 2020-12-18 DIAGNOSIS — L570 Actinic keratosis: Secondary | ICD-10-CM | POA: Diagnosis not present

## 2020-12-18 DIAGNOSIS — Z1283 Encounter for screening for malignant neoplasm of skin: Secondary | ICD-10-CM

## 2020-12-18 DIAGNOSIS — L820 Inflamed seborrheic keratosis: Secondary | ICD-10-CM | POA: Diagnosis not present

## 2020-12-18 DIAGNOSIS — L578 Other skin changes due to chronic exposure to nonionizing radiation: Secondary | ICD-10-CM | POA: Diagnosis not present

## 2020-12-18 NOTE — Patient Instructions (Signed)

## 2020-12-18 NOTE — Progress Notes (Signed)
Follow-Up Visit   Subjective  Isaac Weber is a 75 y.o. male who presents for the following: Actinic Keratosis (Face, ears, and hands x 31 previously tx with LN2 - check for persistence today ). He has noticed an irregular skin lesion on the back he would like checked today.  The patient presents for Upper Body Skin Exam (UBSE) for skin cancer screening and mole check.  The following portions of the chart were reviewed this encounter and updated as appropriate:   Tobacco  Allergies  Meds  Problems  Med Hx  Surg Hx  Fam Hx     Review of Systems:  No other skin or systemic complaints except as noted in HPI or Assessment and Plan.  Objective  Well appearing patient in no apparent distress; mood and affect are within normal limits.  All skin waist up examined.  Objective  L hand x 16, face x 10, L ear x 1 (27): Erythematous thin papules/macules with gritty scale.   Objective  R post flank x 1, L middle finger x 1 (2): Erythematous keratotic or waxy stuck-on papule or plaque.   Assessment & Plan  AK (actinic keratosis) (27) L hand x 16, face x 10, L ear x 1  Destruction of lesion - L hand x 16, face x 10, L ear x 1 Complexity: simple   Destruction method: cryotherapy   Informed consent: discussed and consent obtained   Timeout:  patient name, date of birth, surgical site, and procedure verified Lesion destroyed using liquid nitrogen: Yes   Region frozen until ice ball extended beyond lesion: Yes   Outcome: patient tolerated procedure well with no complications   Post-procedure details: wound care instructions given    Inflamed seborrheic keratosis (2) R post flank x 1, L middle finger x 1  Consider bx if L middle finger ISK not resolved at follow up appointment.   Destruction of lesion - R post flank x 1, L middle finger x 1 Complexity: simple   Destruction method: cryotherapy   Informed consent: discussed and consent obtained   Timeout:  patient name, date of  birth, surgical site, and procedure verified Lesion destroyed using liquid nitrogen: Yes   Region frozen until ice ball extended beyond lesion: Yes   Outcome: patient tolerated procedure well with no complications   Post-procedure details: wound care instructions given     Actinic Damage - Severe, confluent actinic changes with pre-cancerous actinic keratoses  - Severe, chronic, not at goal, secondary to cumulative UV radiation exposure over time - diffuse scaly erythematous macules and papules with underlying dyspigmentation - Discussed Prescription "Field Treatment" for Severe, Chronic Confluent Actinic Changes with Pre-Cancerous Actinic Keratoses Field treatment involves treatment of an entire area of skin that has confluent Actinic Changes (Sun/ Ultraviolet light damage) and PreCancerous Actinic Keratoses by method of PhotoDynamic Therapy (PDT) and/or prescription Topical Chemotherapy agents such as 5-fluorouracil, 5-fluorouracil/calcipotriene, and/or imiquimod.  The purpose is to decrease the number of clinically evident and subclinical PreCancerous lesions to prevent progression to development of skin cancer by chemically destroying early precancer changes that may or may not be visible.  It has been shown to reduce the risk of developing skin cancer in the treated area. As a result of treatment, redness, scaling, crusting, and open sores may occur during treatment course. One or more than one of these methods may be used and may have to be used several times to control, suppress and eliminate the PreCancerous changes. Discussed treatment course,  expected reaction, and possible side effects. - Recommend daily broad spectrum sunscreen SPF 30+ to sun-exposed areas, reapply every 2 hours as needed.  - Staying in the shade or wearing long sleeves, sun glasses (UVA+UVB protection) and wide brim hats (4-inch brim around the entire circumference of the hat) are also recommended. - Call for new or  changing lesions. - In 4 weeks start PDT on the face.   Seborrheic Keratoses - Stuck-on, waxy, tan-brown papules and/or plaques  - Benign-appearing - Discussed benign etiology and prognosis. - Observe - Call for any changes  Return in about 6 months (around 06/20/2021) for AK follow up; in 4 weeks for PDT of the face with nurse.  Luther Redo, CMA, am acting as scribe for Sarina Ser, MD .  Documentation: I have reviewed the above documentation for accuracy and completeness, and I agree with the above.  Sarina Ser, MD

## 2020-12-19 NOTE — Telephone Encounter (Signed)
Received the following message from patient:   "Good Morning Doctor, Aaron Edelman and yourself both gave me emergency packs of Prednisone  and some Antibiotics for my trip to [place]. Well I'm happy to report I didn't need them. Soon after I got back maybe the second week of April I started coughing and the Phlegm started, of course I thought it  was post nasal drip. I had a scheduled appointment with my primary care Doctor. Just before I went of course my wife Fraser Din said you know your having another Asthma Flare up. Dr. Parks Ranger  confirmed it. I told him the Prednisone and Antibiotics I had at home. So Dr. Raliegh Ip put me on a regiment of Prednisone said he didn't think I needed the Antibiotics .  I had mention if I took the medications I would let you know so that's what I'm doing. I guess I have a question is it common to have this many Flare ups this is my third this year Jan. Feb. and now May which probably started in April. PS We loved [place] but there is no place like home. I hope you and your staff are doing well.                               Thanks Isaac Weber"  MR, can you please advise? Thanks.

## 2020-12-22 ENCOUNTER — Encounter: Payer: Self-pay | Admitting: Dermatology

## 2020-12-23 NOTE — Telephone Encounter (Signed)
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

## 2020-12-23 NOTE — Telephone Encounter (Signed)
Attempted to call pt to get him an appt scheduled with MR in July 2022 as that is when his first avail opening is but unable to reach pt. Left message for pt to return call.

## 2020-12-23 NOTE — Telephone Encounter (Signed)
Pt returning a phone call. Pt has an f/u with MR on 7/5. Pt can be reached at [phone].

## 2021-01-05 NOTE — Telephone Encounter (Signed)
Scheduled pt for appointment on 6/24. Nothing further needed at this time.  Routing to Dr. Chase Caller as Juluis Rainier

## 2021-01-07 NOTE — Telephone Encounter (Signed)
Please see if they can do feno on 01/16/21. Sendng note to Triage and Raquel Sarna

## 2021-01-16 ENCOUNTER — Encounter: Payer: Self-pay | Admitting: Internal Medicine

## 2021-01-16 ENCOUNTER — Ambulatory Visit: Payer: PPO | Admitting: Internal Medicine

## 2021-01-16 ENCOUNTER — Ambulatory Visit (INDEPENDENT_AMBULATORY_CARE_PROVIDER_SITE_OTHER): Payer: PPO

## 2021-01-16 ENCOUNTER — Other Ambulatory Visit: Payer: Self-pay

## 2021-01-16 VITALS — BP 128/78 | HR 93 | Temp 97.6°F | Ht 69.0 in | Wt 166.0 lb

## 2021-01-16 DIAGNOSIS — R0600 Dyspnea, unspecified: Secondary | ICD-10-CM

## 2021-01-16 DIAGNOSIS — R21 Rash and other nonspecific skin eruption: Secondary | ICD-10-CM | POA: Diagnosis not present

## 2021-01-16 DIAGNOSIS — R053 Chronic cough: Secondary | ICD-10-CM

## 2021-01-16 DIAGNOSIS — D721 Eosinophilia, unspecified: Secondary | ICD-10-CM | POA: Diagnosis not present

## 2021-01-16 DIAGNOSIS — Z87891 Personal history of nicotine dependence: Secondary | ICD-10-CM | POA: Diagnosis not present

## 2021-01-16 DIAGNOSIS — J45991 Cough variant asthma: Secondary | ICD-10-CM

## 2021-01-16 DIAGNOSIS — R059 Cough, unspecified: Secondary | ICD-10-CM | POA: Diagnosis not present

## 2021-01-16 DIAGNOSIS — R0609 Other forms of dyspnea: Secondary | ICD-10-CM

## 2021-01-16 DIAGNOSIS — I700 Atherosclerosis of aorta: Secondary | ICD-10-CM | POA: Diagnosis not present

## 2021-01-16 DIAGNOSIS — R0989 Other specified symptoms and signs involving the circulatory and respiratory systems: Secondary | ICD-10-CM | POA: Diagnosis not present

## 2021-01-16 LAB — CBC WITH DIFFERENTIAL/PLATELET
Basophils Absolute: 0.1 10*3/uL (ref 0.0–0.1)
Basophils Relative: 0.8 % (ref 0.0–3.0)
Eosinophils Absolute: 0.2 10*3/uL (ref 0.0–0.7)
Eosinophils Relative: 2 % (ref 0.0–5.0)
HCT: 38.4 % — ABNORMAL LOW (ref 39.0–52.0)
Hemoglobin: 13.8 g/dL (ref 13.0–17.0)
Lymphocytes Relative: 15.1 % (ref 12.0–46.0)
Lymphs Abs: 1.3 10*3/uL (ref 0.7–4.0)
MCHC: 36 g/dL (ref 30.0–36.0)
MCV: 87.6 fl (ref 78.0–100.0)
Monocytes Absolute: 0.8 10*3/uL (ref 0.1–1.0)
Monocytes Relative: 9 % (ref 3.0–12.0)
Neutro Abs: 6.2 10*3/uL (ref 1.4–7.7)
Neutrophils Relative %: 73.1 % (ref 43.0–77.0)
Platelets: 225 10*3/uL (ref 150.0–400.0)
RBC: 4.39 Mil/uL (ref 4.22–5.81)
RDW: 13.4 % (ref 11.5–15.5)
WBC: 8.5 10*3/uL (ref 4.0–10.5)

## 2021-01-16 LAB — NITRIC OXIDE: Nitric Oxide: 40

## 2021-01-16 MED ORDER — PREDNISONE 10 MG PO TABS: ORAL_TABLET | ORAL | 0 refills | Status: AC

## 2021-01-16 MED ORDER — FLUTICASONE FUROATE-VILANTEROL 200-25 MCG/INH IN AEPB: 1.0000 | INHALATION_SPRAY | Freq: Every day | RESPIRATORY_TRACT | 0 refills | Status: DC

## 2021-01-16 MED ORDER — FLUTICASONE FUROATE-VILANTEROL 200-25 MCG/INH IN AEPB: 1.0000 | INHALATION_SPRAY | Freq: Every day | RESPIRATORY_TRACT | 5 refills | Status: DC

## 2021-01-16 NOTE — Progress Notes (Signed)
IOV  07/08/2017  Chief Complaint  Patient presents with   Advice Only    Referred by Dr. Drema Dallas for cough x32months.  Pt states that his cough is  a dry cough. Pt was put on nexium x2 weeks for acid reflux which he states has loosened the mucus some.      75 year old male originally from [place] and a former Company secretary.  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  He tells me that mid September 2018 he went to the beach.  Upon return from the beach his granddaughter was sick with a cough and subsequently on April 16, 2017 he abruptly developed a cough.  Since then the cough has persisted.  The only thing that has improved and his cough is that he is no longer having significant night cough although he still does have some  amount of night cough.  This night cough resolved after opioid cough syrup.  Cough is made worse by talking, laughing and also randomly.  It is associated with clearing of the throat.  He feels a tightness in the upper chest.  There is no associated wheezing but there is still some residual nocturnal symptoms.  There is no associated acid reflux.  He has been on Nexium for a week or 2 without much relief.  He has not tried anything for nasal but he denies any nasal discharge.  He is not on any ACE inhibitors.  He does not have a previous history of asthma but his exam nitric oxide today is elevated borderline at 44 ppb.  He does clear the throat.  The cough is annoying.  He tells me that he did have a chest x-ray with his primary care physician that was clear.  The history is obtained from him, talking to his wife and review of the primary care physician referral notes.  Lab review she had a hemoglobin 11.6 g% in February 2016 and a eosinophils f 300 cells.  He is a former smoker  He does not have any shortness of breath.  He walks over a mile.  Sometimes he will cough and a pro-air can help him.  He does find the [employer] helps him.  At this point in time he prefers for conservative  simplistic line of treatment   OV 08/18/2017  Chief Complaint  Patient presents with   Follow-up    Pt states he has been doing good since last visit. Cough is better.   Follow-up chronic cough  After last visit he decided to take Asmanex.  He also followed a diet and took Prilosec.  With this the cough is more than 60% better as documented in the RSI cough score.  He does not want to take any more new medications.  He feels prednisone burst helped him a lot in the initial few days.  He is wondering which of these measures have helped him the most.  There are no new issues.  He wants to liberalize his diet which he says is actually helping him with the sugars and with the cough but he would like to be less disciplined about food.  He is willing to continue with Asmanex   feno 43 ppb  OV 02/28/2018  Chief Complaint  Patient presents with   Follow-up    Pt states things had been doing good for him and did go off of the inhaler but stated after he had been off of the inhaler for about a week, the  cough came back in the mornings and now he states he is coughing all the time. He states he is still not taking the medication and he states he is still coughing.    Follow-up chronic cough  He is here with his wife. He tells me that approximately one month ago because he was feeling well without any cough he stopped his Asmanex and then 2 weeks ago his cough returned. RSI cough score is 17. When he lies downhe has a cough. But he does not wake up in the middle of the night because of cough. There is no associated wheezing or shortness of breath or chest tightness or any change in his health status. Social history: His wife is new diagnosis of liver cirrhosis. She is going MRI today. Exam nitric oxide is in the indeterminate range today for him. Review of the chart indicates last chest x-ray was in 2010.    OV 05/31/2018  Subjective:  Patient ID: MARISOL GLAZER, male , DOB: [birth year] , age 25  y.o. , MRN: [id number] , ADDRESS: [address] [place] [facility] [address]   05/31/2018 -   Chief Complaint  Patient presents with   Follow-up    Doing well at this time.     HPI JANMICHAEL GIRAUD 75 y.o. -presents for follow-up of cough variant asthma.  Last visit we put him back on Asmanex after his cough deteriorated.  This was in August 2019.  With this his cough is significantly improved RSI cough score is 5.  He realizes the value of taking Asmanex.  He takes his Asmanex 1 puff 2 times daily.  The only interim issues that March 09, 2018 he ended up in the ER lab review shows normal.  It was for dizziness.  Socially his wife Fraser Din has had liver cyst surgery at [facility] in October 2019 and is doing well.  He is up-to-date with his flu shot but our internal immunization record shows that he has not had pneumonia vaccine.        OV 02/20/2019  Subjective:  Patient ID: TIEGAN TERPSTRA, male , DOB: June 22, 1946 , age 40 y.o. , MRN: [id number] , ADDRESS: [address] [place] [facility] [address]   02/20/2019 -   Chief Complaint  Patient presents with   Cough    Having more trouble with phlegm.     HPI EXAVIOR KIMMONS 75 y.o. -presents for chronic cough and cough variant asthma.  He is a former smoker 42 pack.  Last chest x-ray was August 2019.  At that time it was clear.  No previous CT scan of the chest.  He tells me that overall he is doing stable although in the last 6 weeks he has had increasing phlegm production.  It is not acute.  He has been socially isolating.  He has not come into contact with anyone with COVID.  He does go to church but mostly does [employer].  His RSI cough score shows significant deterioration as documented below.  The phlegm is not green but more like white.    OV 06/16/2020  Subjective:  Patient ID: TASEAN MANCHA, male , DOB: 1946-01-09 , age 70 y.o. , MRN: [id number] , ADDRESS: [address] [place] [address] PCP Parks Ranger Devonne Doughty, DO Patient Care  Team: Olin Hauser, DO as PCP - General (Family Medicine) Jettie Booze, MD as PCP - Cardiology (Cardiology)  This Provider for this visit: Treatment Team:  Attending Provider: Brand Males, MD  06/16/2020 -   Chief Complaint  Patient presents with   Follow-up    Pt states he had a flare up with asthma about 1 week ago which turned into pneumonia. Pt states he is now better. Has an occ cough which is better. Denies any complaints of wheezing or increased SOB.   Follow-up cough variant asthma on Asmanex Follow-up left upper lobe nodule and a remote smoker for millimeter August 2020 HPI BRENTIN SHIN 75 y.o. -presents for follow-up.  Last seen in July 2020.  That was by me myself.  He presents now for follow-up.  He was supposed to go to [place] but because of Covid pandemic the history of his been postponed to March 2022.  He tells me it was the end of October 2021 he developed an asthma flare.  He is unclear why he had a flareup.  No sick contacts.  After that he played golf in [place] and this made it worse.  On number second 2020 when he had a chest x-ray with Dr. Raliegh Ip his primary care physician that showed some infiltrates.  He got antibiotics and prednisone.  This helped him.  Symptoms are improving but now he feels the cough is getting worse again.  He is wondering if it is his chronic cough getting worse.  Overall he continues to be compliant with his Asmanex.      Lab Results  Component Value Date   NITRICOXIDE 39 02/28/2018    OV 01/16/2021  Subjective:  Patient ID: [name], male , DOB: 02/03/1946 , age 35 y.o. , MRN: [id number] , ADDRESS: [address] [place] [facility] [address] PCP Parks Ranger Devonne Doughty, DO Patient Care Team: Olin Hauser, DO as PCP - General (Family Medicine) Jettie Booze, MD as PCP - Cardiology (Cardiology)  This Provider for this visit: Treatment Team:  Attending Provider: Brand Males,  MD    01/16/2021 -   Chief Complaint  Patient presents with   Follow-up    Patient reports that he was on prednisone about 6 months and worse in last 6 months, Yellow sputum at times when coughing.    Chronic cough with cough variant asthma on Asmanex  HPI WACO FOERSTER 75 y.o. -presents for follow-up.  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  He tells me since the early part of this year has had frequent exacerbations of cough requiring prednisone.  He had 1 course of prednisone in January and another 1 in February and then again in March before he went to [place] and after he came back from [place].  Each of this helped him but the most recent 1 after his retirement did not help him.  He says and also developed shortness of breath particularly while playing golf.  His cough is worse when he lies down particularly on his back or on the left side.  He says cough is so bad it wakes up his wife.  His RSI cough scores are significant deterioration.  He is not sure about wheezing.  Overall he feels deconditioned.  The cough makes him fatigued.  When he was in [place] he actually was feeling better.  He went to [place] around March 2022.  There is no fever.  He had a CT scan of the chest end of last year that showed no evidence of ILD.  Review of his labs indicate slight eosinophilia.  His nitric oxide exhaled test today was elevated at 40 ppb which is in the gray zone.  He  says he is compliant with his Asmanex.  On exam he did have crackles which I thought was present for the first time.  There is no edema orthopnea    Simple office walk 185 feet x  3 laps goal with forehead probe 01/16/2021   O2 used ra  Number laps completed 3  Comments about pace fast  Resting Pulse Ox/HR 98% and 96/min  Final Pulse Ox/HR 98% and 99/min  Desaturated </= 88% no  Desaturated <= 3% points no  Got Tachycardic >/= 90/min yes  Symptoms at end of test Mild ydspnea  Miscellaneous comments x   Results for DONAVIN, AUDINO" (MRN [id number]) as of 01/16/2021 11:16  Ref. Range 03/09/2018 18:36 03/09/2018 18:45 06/02/2020 08:52 10/01/2020 09:26  Eosinophils Absolute Latest Ref Range: 15 - 500 cells/uL 0.4  205     Dr Lorenza Cambridge Reflux Symptom Index (> 13-15 suggestive of LPR cough) Results for RAHM, MINIX (MRN [id number]) as of 05/31/2018 09:46  Ref. Range 03/09/2018 18:36 03/09/2018 18:45  Eosinophils Absolute Latest Ref Range: 0.0 - 0.7 K/uL 0.4    0 -> 5  =  none ->severe problem.td 07/08/2017  08/18/2017  02/28/2018  05/31/2018 asmanex 1 bid 02/20/2019  01/16/2021   Hoarseness of problem with voice 1 2 2 3 3 1   Clearing  Of Throat 3 2 2  0 2 3  Excess throat mucus or feeling of post nasal drip 1 0 3 0 3 5  Difficulty swallowing food, liquid or tablets 0 0 0 0 0 0  Cough after eating or lying down 4 1 5 1 1 5   Breathing difficulties or choking episodes 0 0 0 0 0 4  Troublesome or annoying cough 5 2 5 1 3 5   Sensation of something sticking in throat or lump in throat 0 0 0 0 0 0  Heartburn, chest pain, indigestion, or stomach acid coming up 0 0 0 0 0 0  TOTAL 14 7 17 5 12 23     Lab Results  Component Value Date   NITRICOXIDE 40 01/16/2021     IMPRESSION: HRCT Dec 2021 1. No evidence of interstitial lung disease. No acute pulmonary disease. 2. Stable 4 mm left upper lobe solid pulmonary nodule, considered benign. 3. Mild centrilobular and paraseptal emphysema with diffuse bronchial wall thickening and saber sheath trachea, compatible with the provided history of COPD. 4. Two vessel coronary atherosclerosis. 5. Aortic Atherosclerosis (ICD10-I70.0) and Emphysema (ICD10-J43.9).     Electronically Signed   By: Ilona Sorrel M.D.   On: 07/24/2020 11:08    PFT  No flowsheet data found.     has a past medical history of Abnormal chest CT, Allergic rhinitis, unspecified, Anemia, Aortic atherosclerosis ([place]), BPH (benign prostatic hyperplasia), COPD (chronic obstructive pulmonary disease) ([place]),  Coronary artery disease, Cough variant asthma, Glaucoma, Hypertension, Other chronic pain, Pancreatic lesion, Pure hypercholesterolemia, unspecified, 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   reports that he quit smoking about 49 years ago. His smoking use included cigarettes. He has a 78.00 pack-year smoking history. He has never used smokeless tobacco.  Past Surgical History:  Procedure Laterality Date   BUNIONECTOMY Left 2003   hammer toe as well   BUNIONECTOMY WITH HAMMERTOE RECONSTRUCTION Left 2013   repeat   CERVICAL DISC SURGERY     GREEN LIGHT LASER TURP (TRANSURETHRAL RESECTION OF PROSTATE  2010   laser, shrink prostate   KNEE ARTHROSCOPY Right 1991   LEG  SURGERY Left    BENIGN BONE TUMOR   LUMBAR [place] SURGERY  2010   discectomy   LUMBAR DISC SURGERY  2011   PROSTATE SURGERY  2002   shrink prostate   ROTATOR CUFF REPAIR Left 1999   debride, remove bonespur   ROTATOR CUFF REPAIR Right 1996    Allergies  Allergen Reactions   Ciprofloxacin Diarrhea and Nausea And Vomiting   Duloxetine Other (See Comments)    Severe fatique and constipation.   Morphine And Related Diarrhea and Nausea And Vomiting    Immunization History  Administered Date(s) Administered   Fluad Quad(high Dose 65+) 05/13/2019, 06/09/2020   Influenza, High Dose Seasonal PF 07/15/2017, 04/12/2018   PFIZER(Purple Top)SARS-COV-2 Vaccination 02/13/2020, 03/05/2020   Pneumococcal Conjugate-13 01/28/2014   Pneumococcal Polysaccharide-23 03/04/2008, 08/26/2020   Tdap 07/12/2011   Zoster Recombinat (Shingrix) 08/17/2017    Family History  Problem Relation Age of Onset   Heart disease Mother 3   Emphysema Father 5   Heart disease Brother 22     Current Outpatient Medications:    albuterol (VENTOLIN HFA) 108 (90 Base) MCG/ACT inhaler, Inhale 1-2 puffs into the lungs every 4 (four) hours as needed for wheezing or shortness of breath (cough).,  Disp: 1 each, Rfl: 2   amLODipine (NORVASC) 5 MG tablet, Take 1 tablet (5 mg total) by mouth daily., Disp: 90 tablet, Rfl: 3   ASMANEX HFA 200 MCG/ACT AERO, INHALE TWO PUFFS into THE lungs TWICE DAILY AS NEEDED, Disp: 13 g, Rfl: 5   Calcium Carb-Cholecalciferol (CALCIUM + D3 PO), Take by mouth., Disp: , Rfl:    diclofenac Sodium (VOLTAREN) 1 % GEL, Apply topically 4 (four) times daily., Disp: , Rfl:    ferrous sulfate 325 (65 FE) MG tablet, Take 325 mg by mouth daily with breakfast., Disp: , Rfl:    fluticasone (FLONASE) 50 MCG/ACT nasal spray, Place 1 spray into both nostrils daily., Disp: 16 g, Rfl: 4   FREESTYLE LITE test strip, , Disp: , Rfl:    Ginger, Zingiber officinalis, (GINGER PO), Take by mouth., Disp: , Rfl:    Glucosamine-Chondroitin (GLUCOSAMINE CHONDR COMPLEX PO), Take by mouth 2 (two) times daily. , Disp: , Rfl:    hydrochlorothiazide (HYDRODIURIL) 12.5 MG tablet, Take 1 tablet (12.5 mg total) by mouth daily., Disp: 90 tablet, Rfl: 3   ibuprofen (ADVIL,MOTRIN) 200 MG tablet, Take 200-800 mg by mouth every 6 (six) hours as needed for fever, headache, mild pain, moderate pain or cramping. , Disp: , Rfl:    ketoconazole (NIZORAL) 2 % shampoo, APPLY SHAMPOO AND LATHER 3 TIMES WEEKLY. LEAVE ON FOR 5-8 MINUTES RINSE WELL, Disp: 120 mL, Rfl: 3   Lancets (FREESTYLE) lancets, Use to test your blood sugar once a day, Disp: 100 each, Rfl: 3   loratadine (CLARITIN) 10 MG tablet, Take 10 mg by mouth daily., Disp: , Rfl:    losartan (COZAAR) 50 MG tablet, Take 1 tablet (50 mg total) by mouth daily., Disp: 90 tablet, Rfl: 3   metFORMIN (GLUCOPHAGE-XR) 500 MG 24 hr tablet, Take 1 tablet (500 mg total) by mouth at bedtime., Disp: 90 tablet, Rfl: 3   Multiple Vitamin (MULTIVITAMIN WITH MINERALS) TABS tablet, Take 1 tablet by mouth daily., Disp: , Rfl:    rosuvastatin (CRESTOR) 5 MG tablet, Take 1 tablet (5 mg total) by mouth daily., Disp: 90 tablet, Rfl: 3   tamsulosin (FLOMAX) 0.4 MG CAPS capsule,  Take 1 capsule (0.4 mg total) by mouth daily., Disp: 90 capsule, Rfl:  3      Objective:   Vitals:   01/16/21 1039  BP: 128/78  Pulse: 93  Temp: 97.6 F (36.4 C)  TempSrc: Oral  SpO2: 98%  Weight: 166 lb (75.3 kg)  Height: 5\' 9"  (1.753 m)    Estimated body mass index is 24.51 kg/m as calculated from the following:   Height as of this encounter: 5\' 9"  (1.753 m).   Weight as of this encounter: 166 lb (75.3 kg).  @WEIGHTCHANGE @  [employer]   01/16/21 1039  Weight: 166 lb (75.3 kg)     Physical Exam General: No distress. Loosks some deconditoned Neuro: Alert and Oriented x 3. GCS 15. Speech normal Psych: Pleasant Resp:  Barrel Chest - no.  Wheeze - maybe posterioroy, Crackles - bialateral bibasal, No overt respiratory distress CVS: Normal heart sounds. Murmurs - no Ext: Stigmata of Connective Tissue Disease - no HEENT: Normal upper airway. PEERL +. No post nasal drip        Assessment:       ICD-10-CM   1. Cough variant asthma  J45.991 Nitric oxide    2. Chronic cough  R05.3     3. Former smoker, stopped smoking in distant past  Z87.891     4. Dyspnea on exertion  R06.00     5. Eosinophilia, unspecified type  D72.10     6. Bibasilar crackles  R09.89          Plan:     Patient Instructions     ICD-10-CM   1. Cough variant asthma  J45.991 Nitric oxide    2. Chronic cough  R05.3     3. Former smoker, stopped smoking in distant past  Z87.891     4. Dyspnea on exertion  R06.00     5. Eosinophilia, unspecified type  D72.10     6. Bibasilar crackles  R09.89       Concern for some reason such as allergic asthma that asthma spun out of control Other possibilities that you have a new health issue based on the fact you have crackles on exam  -One consideration is pulmonary fibrosis or interstitial lung disease but a walk test help pretty good today and her December CT scan of the chest did not show this condition  -Other is any issues regarding  the heart  Plan  - Do CBC with differential, blood IgE and RAST allergy panel -Do chest x-ray two-view [for the moment we will hold off any CT scan of the chest] -Do blood BNP -Based on the above results could order an echocardiogram -Stop Asmanex and start BREO 200 strength 1 puff once daily with albuterol as needed  -This is step up asthma treatment  -Take sample if we have - Please take prednisone 40 mg x1 day, then 30 mg x1 day, then 20 mg x1 day, then 10 mg x1 day, and then 5 mg x1 day and stop   Follow-up - Return within the next 2-4 weeks to see nurse practitioner -Return within 2-3 months to see myself Dr. Gaye Alken    Dr. Brand Males, M.D., F.C.C.P,  Pulmonary and Critical Care Medicine Staff Physician, [place] Director - Interstitial Lung Disease  Program  [address] [place], [place], [address]  Pager: [phone], If no answer or between  15:00h - 7:00h: call [phone] Telephone: [phone]  11:50 AM 01/16/2021

## 2021-01-16 NOTE — Patient Instructions (Addendum)
ICD-10-CM   1. Cough variant asthma  J45.991 Nitric oxide    2. Chronic cough  R05.3     3. Former smoker, stopped smoking in distant past  Z87.891     4. Dyspnea on exertion  R06.00     5. Eosinophilia, unspecified type  D72.10     6. Bibasilar crackles  R09.89       Concern for some reason such as allergic asthma that asthma spun out of control Other possibilities that you have a new health issue based on the fact you have crackles on exam  -One consideration is pulmonary fibrosis or interstitial lung disease but a walk test help pretty good today and her December CT scan of the chest did not show this condition  -Other is any issues regarding the heart  Plan  - Do CBC with differential, blood IgE and RAST allergy panel -Do chest x-ray two-view [for the moment we will hold off any CT scan of the chest] -Do blood BNP -Based on the above results could order an echocardiogram -Stop Asmanex and start BREO 200 strength 1 puff once daily with albuterol as needed  -This is step up asthma treatment  -Take sample if we have - Please take prednisone 40 mg x1 day, then 30 mg x1 day, then 20 mg x1 day, then 10 mg x1 day, and then 5 mg x1 day and stop   Follow-up - Return within the next 2-4 weeks to see nurse practitioner -Return within 2-3 months to see myself Dr. Chase Caller

## 2021-01-17 LAB — PRO B NATRIURETIC PEPTIDE: NT-Pro BNP: <5 pg/mL (ref 0–486)

## 2021-01-18 NOTE — Progress Notes (Signed)
Eos 200ccells. CBC normal . RAST allergy panel pending. CXr clear

## 2021-01-19 ENCOUNTER — Telehealth: Payer: Self-pay | Admitting: Internal Medicine

## 2021-01-19 LAB — RESPIRATORY ALLERGY PROFILE REGION II ~~LOC~~

## 2021-01-19 LAB — INTERPRETATION:

## 2021-01-19 NOTE — Telephone Encounter (Signed)
I thought he was in sginificant asthma but his eos, IgE and blood allergy are all negative. He halso had some crackles but his CXR was clear and BNP normal  Somehwat puzzling. Need to ensure there is not something else going on other than asthma  Plan  =- get ECHO for dyspnea on exertion next few days to few weeks - Hold off on another CT for now - get full PFT after stseroids but before/on day of next follolwup visit

## 2021-01-20 ENCOUNTER — Telehealth: Payer: Self-pay | Admitting: Internal Medicine

## 2021-01-20 DIAGNOSIS — R0600 Dyspnea, unspecified: Secondary | ICD-10-CM

## 2021-01-20 NOTE — Telephone Encounter (Signed)
Pt is scheduled to see [facility] 02/04/21. It does not work out for pt to have PFT same day as that [place] but we can get pt scheduled with PFT before then so [name] an be able to go over the results of the PFT with him during that [place] with her.  Attempted to call pt to discuss info per MR and to schedule PFT but unable to reach. Left message for him to return call.

## 2021-01-20 NOTE — Telephone Encounter (Signed)
Spoke with pt and reviewed blood draw results. Pt stated understanding. Placed orders for Echo and PFTs. Nothing further needed at this time.

## 2021-01-22 ENCOUNTER — Other Ambulatory Visit: Payer: Self-pay

## 2021-01-22 ENCOUNTER — Ambulatory Visit (INDEPENDENT_AMBULATORY_CARE_PROVIDER_SITE_OTHER): Payer: PPO

## 2021-01-22 DIAGNOSIS — L570 Actinic keratosis: Secondary | ICD-10-CM | POA: Diagnosis not present

## 2021-01-22 MED ORDER — AMINOLEVULINIC ACID HCL 20 % EX SOLR
1.0000 "application " | Freq: Once | CUTANEOUS | Status: AC
  Administered 2021-01-22: 354 mg via TOPICAL

## 2021-01-22 NOTE — Patient Instructions (Signed)

## 2021-01-22 NOTE — Progress Notes (Signed)
Patient completed PDT therapy today.  1. AK (actinic keratosis) Head - Anterior (Face)  Photodynamic therapy - Head - Anterior (Face) Procedure discussed: discussed risks, benefits, side effects. and alternatives   Prep: site scrubbed/prepped with acetone   Location:  Face Number of lesions:  Multiple Type of treatment:  Blue light Aminolevulinic Acid (see MAR for details): Levulan Number of Levulan sticks used:  1 Incubation time (minutes):  60 Number of minutes under lamp:  16 Number of seconds under lamp:  40 Cooling:  Floor fan Outcome: patient tolerated procedure well with no complications   Post-procedure details: sunscreen applied    Aminolevulinic Acid HCl 20 % SOLR 354 mg - Head - Anterior (Face)

## 2021-01-22 NOTE — Telephone Encounter (Signed)
PFT has been able to be scheduled for pt and info on what pt should not do prior to the PFT was sent to pt in a mychart message. Nothing further needed.

## 2021-01-27 ENCOUNTER — Ambulatory Visit: Payer: PPO | Admitting: Internal Medicine

## 2021-01-27 ENCOUNTER — Other Ambulatory Visit: Payer: Self-pay

## 2021-01-27 ENCOUNTER — Ambulatory Visit (INDEPENDENT_AMBULATORY_CARE_PROVIDER_SITE_OTHER): Payer: PPO | Admitting: Internal Medicine

## 2021-01-27 DIAGNOSIS — R0600 Dyspnea, unspecified: Secondary | ICD-10-CM | POA: Diagnosis not present

## 2021-01-27 LAB — PULMONARY FUNCTION TEST
DL/VA % pred: 120 %
DL/VA: 4.83 ml/min/mmHg/L
DLCO cor % pred: 125 %
DLCO cor: 29.53 ml/min/mmHg
DLCO unc % pred: 122 %
DLCO unc: 28.84 ml/min/mmHg
FEF 25-75 Post: 3.07 L/sec
FEF 25-75 Pre: 3.44 L/sec
FEF2575-%Change-Post: -10 %
FEF2575-%Pred-Post: 150 %
FEF2575-%Pred-Pre: 168 %
FEV1-%Change-Post: 1 %
FEV1-%Pred-Post: 97 %
FEV1-%Pred-Pre: 95 %
FEV1-Post: 2.73 L
FEV1-Pre: 2.7 L
FEV1FVC-%Change-Post: -3 %
FEV1FVC-%Pred-Pre: 112 %
FEV6-%Change-Post: 4 %
FEV6-%Pred-Post: 92 %
FEV6-%Pred-Pre: 89 %
FEV6-Post: 3.39 L
FEV6-Pre: 3.25 L
FEV6FVC-%Change-Post: 0 %
FEV6FVC-%Pred-Post: 104 %
FEV6FVC-%Pred-Pre: 105 %
FVC-%Change-Post: 5 %
FVC-%Pred-Post: 89 %
FVC-%Pred-Pre: 84 %
FVC-Post: 3.48 L
FVC-Pre: 3.31 L
Post FEV1/FVC ratio: 78 %
Post FEV6/FVC ratio: 97 %
Pre FEV1/FVC ratio: 82 %
Pre FEV6/FVC Ratio: 98 %
RV % pred: 95 %
RV: 2.34 L
TLC % pred: 99 %
TLC: 6.62 L

## 2021-01-27 NOTE — Progress Notes (Signed)
PFT done today. 

## 2021-01-28 NOTE — Progress Notes (Signed)
Patient in office today and seen by EJ with [facility] for diabetic shoe measurements. Patient's diabetes is being treated by Dr. Parks Ranger at this time. A shoe selection was made and the patient was advised that the office will call when the shoes are available for pick-up. Patient verbalized understanding.

## 2021-01-29 NOTE — Telephone Encounter (Signed)
MR pt is calling to see what the results of the PFT was.  This was done on 07/05.   Please advise. Thanks.

## 2021-01-30 NOTE — Telephone Encounter (Signed)
PFT normal except dlco slightly high c/w asthma. Feno test slightoy high - als c/w asthma but allergy test all normal   I am struggling with his cough  Plan  - get echo (I will be out of town for 2 weeks) -I fthis is also normal-> then we have to get repeat HRCT which I have been  holding off because most recent CT was in dec  - prob should have office visit with app soo  - if all workup negative then consider biologic for asthma due to bad cough   PFT Results Latest Ref Rng & Units 01/27/2021  FVC-Pre L 3.31  FVC-Predicted Pre % 84  FVC-Post L 3.48  FVC-Predicted Post % 89  Pre FEV1/FVC % % 82  Post FEV1/FCV % % 78  FEV1-Pre L 2.70  FEV1-Predicted Pre % 95  FEV1-Post L 2.73  DLCO uncorrected ml/min/mmHg 28.84  DLCO UNC% % 122  DLCO corrected ml/min/mmHg 29.53  DLCO COR %Predicted % 125  DLVA Predicted % 120  TLC L 6.62  TLC % Predicted % 99  RV % Predicted % 95   Results for BROWNIE, NEHME "MIKE" (MRN [id number]) as of 01/30/2021 14:33  Ref. Range 01/16/2021 12:04  Sheep Sorrel IgE Latest Units: kU/L <0.10  Pecan/Hickory Tree IgE Latest Units: kU/L <0.10  IgE (Immunoglobulin E), Serum Latest Ref Range: <OR=114 kU/L 4  Allergen, D pternoyssinus,d7 Latest Units: kU/L <0.10  Cat Dander Latest Units: kU/L <0.10  Dog Dander Latest Units: kU/L <0.10  [place] Grass Latest Units: kU/L <0.10  Johnson Grass Latest Units: kU/L <0.10  Timothy Grass Latest Units: kU/L <0.10  Cockroach Latest Units: kU/L <0.10  Aspergillus fumigatus, m3 Latest Units: kU/L <0.10  Allergen, Comm Silver Wendee Copp, t9 Latest Units: kU/L <0.10  Allergen, Cottonwood, t14 Latest Units: kU/L <0.10  Elm IgE Latest Units: kU/L <0.10  Allergen, Mulberry, t76 Latest Units: kU/L <0.10  Allergen, Oak,t7 Latest Units: kU/L <0.10  COMMON RAGWEED (SHORT) (W1) IGE Latest Units: kU/L <0.10  Allergen, Mouse Urine Protein, e78 Latest Units: kU/L <0.10  D. farinae Latest Units: kU/L <0.10  Allergen, Cedar tree, t12 Latest  Units: kU/L <0.10  Box Elder IgE Latest Units: kU/L <0.10  Rough Pigweed  IgE Latest Units: kU/L <0.10    Results for ILIJAH, DOUCET" (MRN [id number]) as of 01/30/2021 14:33  Ref. Range 09/16/2014 13:54 03/09/2018 18:36 06/02/2020 08:52 01/16/2021 12:04  Eosinophils Absolute Latest Ref Range: 0.0 - 0.7 K/uL 0.3 0.4 205 0.2   Lab Results  Component Value Date   NITRICOXIDE 40 01/16/2021

## 2021-02-01 ENCOUNTER — Other Ambulatory Visit: Payer: Self-pay | Admitting: Primary Care

## 2021-02-04 ENCOUNTER — Ambulatory Visit: Payer: PPO | Admitting: Primary Care

## 2021-02-04 ENCOUNTER — Ambulatory Visit (HOSPITAL_COMMUNITY)
Admission: RE | Admit: 2021-02-04 | Discharge: 2021-02-04 | Disposition: A | Discharge status: Home / Self Care | Payer: PPO | Source: Ambulatory Visit | Attending: Internal Medicine | Admitting: Internal Medicine

## 2021-02-04 ENCOUNTER — Encounter: Payer: Self-pay | Admitting: Primary Care

## 2021-02-04 ENCOUNTER — Other Ambulatory Visit: Payer: Self-pay

## 2021-02-04 DIAGNOSIS — J432 Centrilobular emphysema: Secondary | ICD-10-CM

## 2021-02-04 DIAGNOSIS — J310 Chronic rhinitis: Secondary | ICD-10-CM | POA: Diagnosis not present

## 2021-02-04 DIAGNOSIS — R0609 Other forms of dyspnea: Secondary | ICD-10-CM | POA: Diagnosis not present

## 2021-02-04 DIAGNOSIS — I2510 Atherosclerotic heart disease of native coronary artery without angina pectoris: Secondary | ICD-10-CM | POA: Insufficient documentation

## 2021-02-04 DIAGNOSIS — J449 Chronic obstructive pulmonary disease, unspecified: Secondary | ICD-10-CM | POA: Insufficient documentation

## 2021-02-04 DIAGNOSIS — R0600 Dyspnea, unspecified: Secondary | ICD-10-CM | POA: Diagnosis not present

## 2021-02-04 DIAGNOSIS — J45991 Cough variant asthma: Secondary | ICD-10-CM | POA: Diagnosis not present

## 2021-02-04 DIAGNOSIS — I10 Essential (primary) hypertension: Secondary | ICD-10-CM | POA: Diagnosis not present

## 2021-02-04 LAB — ECHOCARDIOGRAM COMPLETE
Area-P 1/2: 4.1 cm2
Height: 69 in
S' Lateral: 2.9 cm
Weight: 2656 oz

## 2021-02-04 MED ORDER — TRELEGY ELLIPTA 200-62.5-25 MCG/INH IN AEPB: 1.0000 | INHALATION_SPRAY | Freq: Every day | RESPIRATORY_TRACT | 0 refills | Status: DC

## 2021-02-04 MED ORDER — AZELASTINE HCL 0.1 % NA SOLN: 1.0000 | Freq: Two times a day (BID) | NASAL | 2 refills | Status: DC

## 2021-02-04 MED ORDER — OMEPRAZOLE 20 MG PO CPDR: 20.0000 mg | DELAYED_RELEASE_CAPSULE | Freq: Every day | ORAL | 1 refills | Status: DC

## 2021-02-04 NOTE — Assessment & Plan Note (Addendum)
-   Maximizing treatment for possible underlying PND and GERD which could be contributing to cough. Recommend trial Omeprazole 20mg  qd x 6 weeks.

## 2021-02-04 NOTE — Patient Instructions (Addendum)
Most all of your testing is reassuring. Upper airway cough can be from GERD, PND and/or asthma/COPD CT imaging in the past has shown mild emphysema.  CXR was clear recently, allergy panel was negative Awaiting echocardiogram results   Recommendations: Stop Breo. Trial Trelegy 200 take one puff daily in morning x 2 weeks  Start omeprazole 20mg  once daily in the morning x 4-6 weeks  Continue flonase nasal spray once daily  Start Astelin nasal 1 spray per nostril twice a day  Use ocean saline nasal spray twice a day Use mucinex 600mg  twice daily   Rx: Astelin nasal spray Omeprazole   Follow-up: 2 televisit with [name] NP     Fluticasone; Umeclidinium; Vilanterol inhalation powder What is this medication? FLUTICASONE; UMECLIDINIUM; VILANTEROL (floo TIK a sone; ue MEK li DIN ee um; vye LAN ter ol) inhalation is a combination of 3 drugs to treat COPD and asthma. Umeclidinium and Vilanterol are bronchodilators that help keep airways open. Fluticasone decreases inflammation in the lungs. Do not use this drugcombination for acute asthma attacks or bronchospasm. This medicine may be used for other purposes; ask your health care provider orpharmacist if you have questions. COMMON BRAND NAME(S): TRELEGY ELLIPTA What should I tell my care team before I take this medication? They need to know if you have any of these conditions: bone problems diabetes eye disease, vision problems heart disease high blood pressure history of irregular heartbeat immune system problems infection kidney disease pheochromocytoma prostate disease seizures thyroid disease trouble passing urine an unusual or allergic reaction to fluticasone, umeclidinium, vilanterol, lactose, milk proteins, other medicines, foods, dyes, or preservatives pregnant or trying to get pregnant breast-feeding How should I use this medication? This drug is inhaled through the mouth. Rinse your mouth with water after use. Make sure not  to swallow the water. Take it as directed on the prescriptionlabel at the same time every day. Do not use it more often than directed. A special MedGuide will be given to you by the pharmacist with eachprescription and refill. Be sure to read this information carefully each time. Talk to your pediatrician about the use of this drug in children. Special caremay be needed. Overdosage: If you think you have taken too much of this medicine contact apoison control center or emergency room at once. NOTE: This medicine is only for you. Do not share this medicine with others. What if I miss a dose? If you miss a dose, take it as soon as you can. If it is almost time for yournext dose, take only that dose. Do not take double or extra doses. What may interact with this medication? Do not take this medicine with any of the following medications: cisapride dofetilide dronedarone MAOIs like Carbex, Eldepryl, Marplan, Nardil, and Parnate pimozide thioridazine ziprasidone This medicine may also interact with the following medications: aclidinium antihistamines for allergy antiviral medicines for HIV or AIDS atropine beta-blockers like metoprolol and propranolol certain antibiotics like clarithromycin and telithromycin certain medicines for bladder problems like oxybutynin, tolterodine certain medicines for depression, anxiety, or psychotic disturbances certain medicines for fungal infections like ketoconazole, itraconazole, posaconazole, voriconazole certain medicines for Parkinson's disease like benztropine, trihexyphenidyl certain medicines for stomach problems like dicyclomine, hyoscyamine certain medicines for travel sickness like scopolamine conivaptan diuretics ipratropium medicines for colds other medicines for breathing problems other medicines that prolong the QT interval (cause an abnormal heart rhythm) nefazodone tiotropium This list may not describe all possible interactions. Give  your health care provider a list of all  the medicines, herbs, non-prescription drugs, or dietary supplements you use. Also tell them if you smoke, drink alcohol, or use illegaldrugs. Some items may interact with your medicine. What should I watch for while using this medication? Visit your doctor or health care professional for regular checkups. Tell your doctor or health care professional if your symptoms do not get better. Do notuse this medicine more than once every 24 hours. NEVER use this medicine for an acute asthma or COPD attack. You should use your short-acting rescue inhalers for this purpose. If your symptoms get worse or ifyou need your short-acting inhalers more often, call your doctor right away. If you are going to have surgery tell your doctor or health care professional that you are using this medicine. Try not to come in contact with people withthe chicken pox or measles. If you do, call your doctor. This medicine may increase blood sugar. Ask your healthcare provider if changesin diet or medicines are needed if you have diabetes. What side effects may I notice from receiving this medication? Side effects that you should report to your doctor or health care professionalas soon as possible: allergic reactions like skin rash or hives, swelling of the face, lips, or tongue breathing problems right after inhaling your medicine chest pain eye pain fast, irregular heartbeat feeling faint or lightheaded, falls fever or chills nausea, vomiting signs and symptoms of high blood sugar such as being more thirsty or hungry or having to urinate more than normal. You may also feel very tired or have blurry vision. trouble passing urine Side effects that usually do not require medical attention (report these toyour doctor or health care professional if they continue or are bothersome): back pain changes in taste cough diarrhea headache nervousness sore throat tremor This list may not  describe all possible side effects. Call your doctor for medical advice about side effects. You may report side effects to FDA at1-800-FDA-1088. Where should I keep my medication? Keep out of the reach of children and pets. Store at room temperature between 20 and 25 degrees C (68 and 77 degrees F). Keep inhaler away from extreme heat, cold or humidity. Throw away 6 weeks after removing it from the foil pouch, when the dose counter reads "0" or after theexpiration date, whichever is first. NOTE: This sheet is a summary. It may not cover all possible information. If you have questions about this medicine, talk to your doctor, pharmacist, orhealth care provider.  2022 Elsevier/Gold Standard (2019-05-21 12:45:04)

## 2021-02-04 NOTE — Assessment & Plan Note (Signed)
-   Appears to have deviated septum on exam. Allergy panel was normal, Eos 200.  - Continue Flonase nasal spray and adding Astelin 1 spray per nostril BID - Consider referral to ENT and/or CT sinuses

## 2021-02-04 NOTE — Assessment & Plan Note (Signed)
-   Continues to have persistent cough which is very bothersome to him. Cough is occasionally productive with clear mucus. He noticed some minor improvement with BREO 200 and oral prednisone. PFTs did not show overt obstruction, however, he did have mild emphysema on HRCT with diffuse bronchial wall thickenings suggesting COPD. Recommend trial Trelegy 200.

## 2021-02-04 NOTE — Progress Notes (Signed)
@Patient  ID: Isaac Weber, male    DOB: Dec 01, 1945, 75 y.o.   MRN: [id number]  Chief Complaint  Patient presents with   Follow-up    Cough is some better, some clear at times, becomes tired after coughing.     Referring provider: Nobie Putnam *  HPI: 75 year old male, former smoker quit 1973 (20 pack year hx).  Past medical history significant for cough variant asthma.  Patient of Dr. Chase Caller last seen June 2022.  Eosinophils 300.  Cough improved on Asmanex. HRCT in August 2021 showed no evidence of interstitial lung disease, mild paraseptal emphysema and diffuse bronchial wall thickening suggesting COPD.   Previous [facility] pulmonary encounter:  01/16/2021 -  Dr. Chase Caller  Chief Complaint  Patient presents with   Follow-up    Patient reports that he was on prednisone about 6 months and worse in last 6 months, Yellow sputum at times when coughing.    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 -presents for follow-up.  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  He tells me since the early part of this year has had frequent exacerbations of cough requiring prednisone.  He had 1 course of prednisone in January and another 1 in February and then again in March before he went to [place] and after he came back from [place].  Each of this helped him but the most recent 1 after his retirement did not help him.  He says and also developed shortness of breath particularly while playing golf.  His cough is worse when he lies down particularly on his back or on the left side.  He says cough is so bad it wakes up his wife.  His RSI cough scores are significant deterioration.  He is not sure about wheezing.  Overall he feels deconditioned.  The cough makes him fatigued.  When he was in [place] he actually was feeling better.  He went to [place] around March 2022.  There is no fever.  He had a CT scan of the chest end of last year that showed no evidence of ILD.  Review of  his labs indicate slight eosinophilia.  His nitric oxide exhaled test today was elevated at 40 ppb which is in the gray zone.  He says he is compliant with his Asmanex.  On exam he did have crackles which I thought was present for the first time.  There is no edema orthopnea   02/04/2021- Interim hx  Patient presents today for 2-4 week follow-up. During last visit, Asmanex was changed to Breo 200 and he was given prednisone taper. Ordered for [facility] allergy panel and CXR.   He continues to have a cough. He is sleeping better at night. Cough is mildly productive with clear mucus. He reports associated sob with coughing fits. He is taking mucinex as needed. Allergy panel was normal, Eos 200, CXR clear. He has mild emphysema on CT imaging and diffuse bronchial wall thickening suggesting COPD.    Dr Lorenza Cambridge Reflux Symptom Index (> 13-15 suggestive of LPR cough) Results for Isaac Weber, Isaac Weber (MRN [id number]) as of 05/31/2018 09:46  Ref. Range 03/09/2018 18:36 03/09/2018 18:45  Eosinophils Absolute Latest Ref Range: 0.0 - 0.7 K/uL 0.4    0 -> 5  =  none ->severe problem.td 07/08/2017  08/18/2017  02/28/2018  05/31/2018 asmanex 1 bid 02/20/2019  01/16/2021  02/04/2021   Hoarseness of problem with voice 1 2 2 3 3 1  0  Clearing  Of Throat 3 2 2  0 2 3 0  Excess throat mucus or feeling of post nasal drip 1 0 3 0 3 5 5   Difficulty swallowing food, liquid or tablets 0 0 0 0 0 0 0  Cough after eating or lying down 4 1 5 1 1 5 5   Breathing difficulties or choking episodes 0 0 0 0 0 4 2  Troublesome or annoying cough 5 2 5 1 3 5 5   Sensation of something sticking in throat or lump in throat 0 0 0 0 0 0 0  Heartburn, chest pain, indigestion, or stomach acid coming up 0 0 0 0 0 0 0  TOTAL 14 7 17 5 12 23 17      Allergies  Allergen Reactions   Ciprofloxacin Diarrhea and Nausea And Vomiting   Duloxetine Other (See Comments)    Severe fatique and constipation.   Morphine And Related Diarrhea and Nausea And  Vomiting    Immunization History  Administered Date(s) Administered   Fluad Quad(high Dose 65+) 05/13/2019, 06/09/2020   Influenza, High Dose Seasonal PF 07/15/2017, 04/12/2018   PFIZER(Purple Top)SARS-COV-2 Vaccination 02/13/2020, 03/05/2020   Pneumococcal Conjugate-13 01/28/2014   Pneumococcal Polysaccharide-23 03/04/2008, 08/26/2020   Tdap 07/12/2011   Zoster Recombinat (Shingrix) 08/17/2017    Past Medical History:  Diagnosis Date   Abnormal chest CT    Allergic rhinitis, unspecified    Anemia    Aortic atherosclerosis (HCC)    BPH (benign prostatic hyperplasia)    COPD (chronic obstructive pulmonary disease) ([place])    Coronary artery disease    Cough variant asthma    Glaucoma    Hypertension    Other chronic pain    Pancreatic lesion    Pure hypercholesterolemia, unspecified    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 WD SCC, ulcerated. EDC 04/29/2020    Tobacco History: Social History   Tobacco Use  Smoking Status Former   Packs/day: 3.00   Years: 26.00   Pack years: 78.00   Types: Cigarettes   Quit date: 09/17/1971   Years since quitting: 49.4  Smokeless Tobacco Never   Counseling given: Not Answered   Outpatient Medications Prior to Visit  Medication Sig Dispense Refill   albuterol (VENTOLIN HFA) 108 (90 Base) MCG/ACT inhaler Inhale 1-2 puffs into the lungs every 4 (four) hours as needed for wheezing or shortness of breath (cough). 1 each 2   amLODipine (NORVASC) 5 MG tablet Take 1 tablet (5 mg total) by mouth daily. 90 tablet 3   Calcium Carb-Cholecalciferol (CALCIUM + D3 PO) Take by mouth.     diclofenac Sodium (VOLTAREN) 1 % GEL Apply topically 4 (four) times daily.     ferrous sulfate 325 (65 FE) MG tablet Take 325 mg by mouth daily with breakfast.     fluticasone (FLONASE) 50 MCG/ACT nasal spray USE ONE  SPRAY IN EACH NOSTRIL DAILY 48 g 3   fluticasone furoate-vilanterol (BREO ELLIPTA) 200-25 MCG/INH AEPB Inhale 1 puff into the lungs daily. 60 each 5   FREESTYLE LITE test strip      Ginger, Zingiber officinalis, (GINGER PO) Take by mouth.     Glucosamine-Chondroitin (GLUCOSAMINE CHONDR COMPLEX PO) Take by mouth 2 (two) times daily.      hydrochlorothiazide (HYDRODIURIL) 12.5 MG tablet Take 1 tablet (12.5 mg total) by mouth daily. 90 tablet 3  ibuprofen (ADVIL,MOTRIN) 200 MG tablet Take 200-800 mg by mouth every 6 (six) hours as needed for fever, headache, mild pain, moderate pain or cramping.      ketoconazole (NIZORAL) 2 % shampoo APPLY SHAMPOO AND LATHER 3 TIMES WEEKLY. LEAVE ON FOR 5-8 MINUTES RINSE WELL 120 mL 3   Lancets (FREESTYLE) lancets Use to test your blood sugar once a day 100 each 3   loratadine (CLARITIN) 10 MG tablet Take 10 mg by mouth daily.     losartan (COZAAR) 50 MG tablet Take 1 tablet (50 mg total) by mouth daily. 90 tablet 3   metFORMIN (GLUCOPHAGE-XR) 500 MG 24 hr tablet Take 1 tablet (500 mg total) by mouth at bedtime. 90 tablet 3   Multiple Vitamin (MULTIVITAMIN WITH MINERALS) TABS tablet Take 1 tablet by mouth daily.     rosuvastatin (CRESTOR) 5 MG tablet Take 1 tablet (5 mg total) by mouth daily. 90 tablet 3   tamsulosin (FLOMAX) 0.4 MG CAPS capsule Take 1 capsule (0.4 mg total) by mouth daily. 90 capsule 3   fluticasone furoate-vilanterol (BREO ELLIPTA) 200-25 MCG/INH AEPB Inhale 1 puff into the lungs daily. (Patient not taking: Reported on 02/04/2021) 14 each 0   No facility-administered medications prior to visit.    Review of Systems  Review of Systems  Constitutional:  Positive for fatigue.  HENT:  Positive for congestion and postnasal drip.   Respiratory:  Positive for cough. Negative for chest tightness and wheezing.     Physical Exam  BP 122/70 (BP Location: Left Arm, Patient Position: Sitting, Cuff Size: Normal)   Pulse 93   Temp (!) 97.5 F (36.4  C) (Oral)   Ht 5\' 9"  (1.753 m)   Wt 166 lb (75.3 kg)   SpO2 98%   BMI 24.51 kg/m  Physical Exam Constitutional:      Appearance: Normal appearance.  HENT:     Head: Normocephalic and atraumatic.     Nose: Congestion and rhinorrhea present.     Comments: Deviated septum     Mouth/Throat:     Mouth: Mucous membranes are moist.     Pharynx: Oropharynx is clear.  Cardiovascular:     Rate and Rhythm: Normal rate and regular rhythm.  Pulmonary:     Effort: Pulmonary effort is normal.     Comments: Wheeze with cough Neurological:     Mental Status: He is alert.     Lab Results:  CBC    Component Value Date/Time   WBC 8.5 01/16/2021 1204   RBC 4.39 01/16/2021 1204   HGB 13.8 01/16/2021 1204   HGB 11.6 (L) 09/16/2014 1354   HCT 38.4 (L) 01/16/2021 1204   HCT 33.6 (L) 09/16/2014 1354   PLT 225.0 01/16/2021 1204   PLT 232 09/16/2014 1354   MCV 87.6 01/16/2021 1204   MCV 81.4 09/16/2014 1354   MCH 31.4 06/02/2020 0852   MCHC 36.0 01/16/2021 1204   RDW 13.4 01/16/2021 1204   RDW 13.9 09/16/2014 1354   LYMPHSABS 1.3 01/16/2021 1204   LYMPHSABS 1.8 09/16/2014 1354   MONOABS 0.8 01/16/2021 1204   MONOABS 0.6 09/16/2014 1354   EOSABS 0.2 01/16/2021 1204   EOSABS 0.3 09/16/2014 1354   BASOSABS 0.1 01/16/2021 1204   BASOSABS 0.0 09/16/2014 1354    BMET    Component Value Date/Time   NA 135 06/02/2020 0852   NA 140 09/16/2014 1354   K 3.5 06/02/2020 0852   K 3.7 09/16/2014 1354   CL 99 06/02/2020 0852  CO2 26 06/02/2020 0852   CO2 26 09/16/2014 1354   GLUCOSE 114 (H) 06/02/2020 0852   GLUCOSE 153 (H) 09/16/2014 1354   BUN 21 06/02/2020 0852   BUN 16.7 09/16/2014 1354   CREATININE 0.79 06/02/2020 0852   CREATININE 0.9 09/16/2014 1354   CALCIUM 9.5 06/02/2020 0852   CALCIUM 9.4 09/16/2014 1354   GFRNONAA 88 06/02/2020 0852   GFRAA 102 06/02/2020 0852    BNP No results found for: BNP  ProBNP    Component Value Date/Time   PROBNP <5 01/16/2021 1204     Imaging: DG Chest 2 View  Result Date: 01/18/2021 CLINICAL DATA:  75 year old male with cough. EXAM: CHEST - 2 VIEW COMPARISON:  Chest radiograph dated 05/27/2020. FINDINGS: No focal consolidation, pleural effusion, or pneumothorax. The cardiac silhouette is within limits. Atherosclerotic calcification of the aortic arch. Degenerative changes of the spine. No acute osseous pathology. IMPRESSION: No active cardiopulmonary disease. Electronically Signed   By: Anner Crete M.D.   On: 01/18/2021 01:00     Assessment & Plan:   Centrilobular emphysema (HCC) - Continues to have persistent cough which is very bothersome to him. Cough is occasionally productive with clear mucus. He noticed some minor improvement with BREO 200 and oral prednisone. PFTs did not show overt obstruction, however, he did have mild emphysema on HRCT with diffuse bronchial wall thickenings suggesting COPD. Recommend trial Trelegy 200.   Rhinitis - Appears to have deviated septum on exam. Allergy panel was normal, Eos 200.  - Continue Flonase nasal spray and adding Astelin 1 spray per nostril BID - Consider referral to ENT and/or CT sinuses   Cough variant asthma - Maximizing treatment for possible underlying PND and GERD which could be contributing to cough. Recommend trial Omeprazole 20mg  qd x 6 weeks.   40 mins spent on case, >50 % time with patient  Martyn Ehrich, NP 02/04/2021

## 2021-02-05 MED ORDER — TRELEGY ELLIPTA 200-62.5-25 MCG/INH IN AEPB: 1.0000 | INHALATION_SPRAY | Freq: Every day | RESPIRATORY_TRACT | 0 refills | Status: DC

## 2021-02-10 NOTE — Telephone Encounter (Signed)
[name],  This message was received this morning for you.  I know you told me I have to be patient and I understand that. I've only been on the medications you have prescribed for 5 days, I just want to tell I have not gotten any relief. I'm back to coughing more even at night. I'm willing to be patient I just want to ask if you think Calcium Lactate would help? I've heard it seems to be good for chronic cough. I'm willing to wait and see what you have prescribed begins to work. PS my Wife and I both thought you were very thorough with me and we appreciate you very much, Thanks Isaac Weber

## 2021-02-12 NOTE — Telephone Encounter (Signed)
Sorry I was out of office Tuesday and Wednesday. I am only here until 12:30pm today but will be back tomorrow at 9am. I am not familiar with calcium lactate for cough. Is his cough dry or is he getting mucus up? Is his cough more upper airway like clearing is throat? Any purulent mucus? Any chest tightness or wheezing? He is using any nasal spray and is he taking mucinex? If he using trelegy sample?

## 2021-02-18 ENCOUNTER — Other Ambulatory Visit: Payer: Self-pay | Admitting: Family Medicine

## 2021-02-18 DIAGNOSIS — E1169 Type 2 diabetes mellitus with other specified complication: Secondary | ICD-10-CM

## 2021-02-18 MED ORDER — FREESTYLE LITE TEST VI STRP: ORAL_STRIP | 5 refills | Status: DC

## 2021-02-18 NOTE — Telephone Encounter (Signed)
Copied from [place] [id number]. Topic: Quick Communication - Rx Refill/Question >> Feb 18, 2021  9:32 AM Leward Quan A wrote: Medication: FREESTYLE LITE test strip  Has the patient contacted their pharmacy? Yes.   (Agent: If no, request that the patient contact the pharmacy for the refill.) (Agent: If yes, when and what did the pharmacy advise?)  Preferred Pharmacy (with phone number or street name): [facility] - [place], [place] - [address]  Phone:  [phone] Fax:  [phone]     Agent: Please be advised that RX refills may take up to 3 business days. We ask that you follow-up with your pharmacy.

## 2021-02-18 NOTE — Telephone Encounter (Signed)
  Notes to clinic:  medication filled by a historical provider  Review for refill    Requested Prescriptions  Pending Prescriptions Disp Refills   FREESTYLE LITE test strip 100 each       Endocrinology: Diabetes - Testing Supplies Passed - 02/18/2021  9:43 AM      Passed - Valid encounter within last 12 months    Recent Outpatient Visits           2 months ago Type 2 diabetes mellitus with other specified complication, without long-term current use of insulin [facility])   [place], DO   8 months ago Annual physical exam   [place], DO   8 months ago COPD exacerbation [facility])   [facility], Lupita Raider, FNP   9 months ago Mild persistent asthma with acute exacerbation   [facility] Olin Hauser, DO   1 year ago Essential hypertension   [facility] Olin Hauser, DO       Future Appointments             In 1 week Martyn Ehrich, NP [place] [facility]   In 1 month Brand Males, MD [facility]   In 3 months [place], Charlann Lange, MD [place]   In 4 months [place], Devonne Doughty, DO [facility], [place]   In 4 months Ralene Bathe, MD [place]

## 2021-02-23 ENCOUNTER — Telehealth: Payer: Self-pay | Admitting: Primary Care

## 2021-02-23 MED ORDER — TRELEGY ELLIPTA 200-62.5-25 MCG/INH IN AEPB: 1.0000 | INHALATION_SPRAY | Freq: Every day | RESPIRATORY_TRACT | 1 refills | Status: DC

## 2021-02-23 MED ORDER — TRELEGY ELLIPTA 200-62.5-25 MCG/INH IN AEPB: 1.0000 | INHALATION_SPRAY | Freq: Every day | RESPIRATORY_TRACT | 0 refills | Status: DC

## 2021-02-23 NOTE — Telephone Encounter (Signed)
Called and spoke with patient. [place] nephew will be coming to the beach tomorrow and he wondered if he could stop by the office to pick up a sample. I advised him that I will leave a sample at the front desk for his nephew to pick up. He verbalized understanding.   Nothing further needed at time of call.

## 2021-02-23 NOTE — Telephone Encounter (Signed)
Pt states the rx that was sent in for him is going to be $700+. Pt wanting to know if it's okay to be off Trelegy 200 for 1 day. Pt's nephew is coming to the beach tomorrow, if we could get pt 1 Trelegy 200 sample. The nephew could pick it up for him. Please advise [phone]

## 2021-02-23 NOTE — Telephone Encounter (Signed)
Spoke with the pt  He is asking for rx for trelegy to be sent to [facility] [place]  He left his rx at home and is on vacation  Rx sent to preferred pharm  Nothing further needed

## 2021-02-25 ENCOUNTER — Ambulatory Visit: Payer: PPO | Admitting: Primary Care

## 2021-02-27 DIAGNOSIS — R053 Chronic cough: Secondary | ICD-10-CM

## 2021-02-27 NOTE — Telephone Encounter (Signed)
YEs I hear Mr Isaac Weber . The cough has changed and worsened and I am struggling to fix it. I wan to be honest about that  Plan  If the steroid I gav ehim in June helped him can do another course - Take prednisone 40 mg daily x 2 days, then '20mg'$  daily x 2 days, then '10mg'$  daily x 2 days, then '5mg'$  daily x 2 days and stop  - Order HRCT - last one was in dec 2021 and recent cxr was clear but with continued cough like this want to advance getting it now instead of dec 2022  - Get CT sinus without contrast  - yes refer GI - needs pH probe. Who is the GI doc?  - also, refer to Dr Annie Main Denyse Dago at [facility] - who is a chronic cough expert  - has he every tried gabapentin?    Current Outpatient Medications:    albuterol (VENTOLIN HFA) 108 (90 Base) MCG/ACT inhaler, Inhale 1-2 puffs into the lungs every 4 (four) hours as needed for wheezing or shortness of breath (cough)., Disp: 1 each, Rfl: 2   amLODipine (NORVASC) 5 MG tablet, Take 1 tablet (5 mg total) by mouth daily., Disp: 90 tablet, Rfl: 3   azelastine (ASTELIN) 0.1 % nasal spray, Place 1 spray into both nostrils 2 (two) times daily. Use in each nostril as directed, Disp: 30 mL, Rfl: 2   Calcium Carb-Cholecalciferol (CALCIUM + D3 PO), Take by mouth., Disp: , Rfl:    diclofenac Sodium (VOLTAREN) 1 % GEL, Apply topically 4 (four) times daily., Disp: , Rfl:    ferrous sulfate 325 (65 FE) MG tablet, Take 325 mg by mouth daily with breakfast., Disp: , Rfl:    fluticasone (FLONASE) 50 MCG/ACT nasal spray, USE ONE SPRAY IN EACH NOSTRIL DAILY, Disp: 48 g, Rfl: 3   fluticasone furoate-vilanterol (BREO ELLIPTA) 200-25 MCG/INH AEPB, Inhale 1 puff into the lungs daily., Disp: 60 each, Rfl: 5   Fluticasone-Umeclidin-Vilant (TRELEGY ELLIPTA) 200-62.5-25 MCG/INH AEPB, Inhale 1 puff into the lungs daily., Disp: 60 each, Rfl: 0   Fluticasone-Umeclidin-Vilant (TRELEGY ELLIPTA) 200-62.5-25 MCG/INH AEPB, Inhale 1 puff into the lungs daily., Disp:  28 each, Rfl: 0   Fluticasone-Umeclidin-Vilant (TRELEGY ELLIPTA) 200-62.5-25 MCG/INH AEPB, Inhale 1 puff into the lungs daily., Disp: 60 each, Rfl: 1   Fluticasone-Umeclidin-Vilant (TRELEGY ELLIPTA) 200-62.5-25 MCG/INH AEPB, Inhale 1 puff into the lungs daily., Disp: 1 each, Rfl: 0   FREESTYLE LITE test strip, Use to check blood sugar up to 2 x daily, Disp: 100 each, Rfl: 5   Ginger, Zingiber officinalis, (GINGER PO), Take by mouth., Disp: , Rfl:    Glucosamine-Chondroitin (GLUCOSAMINE CHONDR COMPLEX PO), Take by mouth 2 (two) times daily. , Disp: , Rfl:    hydrochlorothiazide (HYDRODIURIL) 12.5 MG tablet, Take 1 tablet (12.5 mg total) by mouth daily., Disp: 90 tablet, Rfl: 3   ibuprofen (ADVIL,MOTRIN) 200 MG tablet, Take 200-800 mg by mouth every 6 (six) hours as needed for fever, headache, mild pain, moderate pain or cramping. , Disp: , Rfl:    ketoconazole (NIZORAL) 2 % shampoo, APPLY SHAMPOO AND LATHER 3 TIMES WEEKLY. LEAVE ON FOR 5-8 MINUTES RINSE WELL, Disp: 120 mL, Rfl: 3   Lancets (FREESTYLE) lancets, Use to test your blood sugar once a day, Disp: 100 each, Rfl: 3   loratadine (CLARITIN) 10 MG tablet, Take 10 mg by mouth daily., Disp: , Rfl:    losartan (COZAAR) 50 MG tablet, Take 1 tablet (50  mg total) by mouth daily., Disp: 90 tablet, Rfl: 3   metFORMIN (GLUCOPHAGE-XR) 500 MG 24 hr tablet, Take 1 tablet (500 mg total) by mouth at bedtime., Disp: 90 tablet, Rfl: 3   Multiple Vitamin (MULTIVITAMIN WITH MINERALS) TABS tablet, Take 1 tablet by mouth daily., Disp: , Rfl:    omeprazole (PRILOSEC) 20 MG capsule, Take 1 capsule (20 mg total) by mouth daily., Disp: 30 capsule, Rfl: 1   rosuvastatin (CRESTOR) 5 MG tablet, Take 1 tablet (5 mg total) by mouth daily., Disp: 90 tablet, Rfl: 3   tamsulosin (FLOMAX) 0.4 MG CAPS capsule, Take 1 capsule (0.4 mg total) by mouth daily., Disp: 90 capsule, Rfl: 3

## 2021-03-01 MED ORDER — PREDNISONE 5 MG PO TABS: ORAL_TABLET | ORAL | 0 refills | Status: DC

## 2021-03-01 NOTE — Telephone Encounter (Signed)
MR please advise. Thanks The HRCT and CT scan order has been placed along with the prednisone that has been sent to the pharmacy and referral to GI has been placed.    Isaac Weber "Isaac Weber"  [facility] Doctor I'm sorry I missed your call. I can try the High dose Prednisone. We should use [facility] on [address] in [place] for this Prescription. I'm good with HRCT & CT sinus The GI doctor I would like to use is Dr. Jonathon Bellows he is with [facility], Phone number [phone].                                                                                                                                                                             It is 31 miles from my home to [facility], if after everything else nothing is resolved I'm wondering if there might be a Chronic cough expert in say [facility] which is [place] away. I have never tried Gabapentin but I have never heard anything positive about that drug. Of course I realize your my Doctor and I will follow your advise. Personally I hope we find something out before then.  Doctor have you ever heard of Calcium Lactate  to treat Chronic Cough ? I asked BDerl Barrowand she hadn't heard off it.  Thanks for all you and your Staff are doing to try and help me.

## 2021-03-02 NOTE — Telephone Encounter (Signed)
His questions on Gabapentin (effective with side effects in some but are reversible), calcium acetate (not heard of) and seeing cough specialist at [facility] (do not know)- willl address at [facility] 03/23/21 after HRCT and sinus results.

## 2021-03-06 ENCOUNTER — Telehealth: Payer: Self-pay

## 2021-03-06 ENCOUNTER — Encounter: Payer: Self-pay | Admitting: Primary Care

## 2021-03-06 ENCOUNTER — Other Ambulatory Visit: Payer: Self-pay

## 2021-03-06 ENCOUNTER — Ambulatory Visit (INDEPENDENT_AMBULATORY_CARE_PROVIDER_SITE_OTHER): Payer: PPO | Admitting: Primary Care

## 2021-03-06 ENCOUNTER — Other Ambulatory Visit: Payer: Self-pay | Admitting: Dermatology

## 2021-03-06 DIAGNOSIS — R053 Chronic cough: Secondary | ICD-10-CM | POA: Diagnosis not present

## 2021-03-06 DIAGNOSIS — J432 Centrilobular emphysema: Secondary | ICD-10-CM | POA: Diagnosis not present

## 2021-03-06 NOTE — Patient Instructions (Signed)
Take doxycycline '100mg'$  twice daily x 5 days, call if not better and we will extend antibiotic  CT chest and sinus imaging is scheduled for 8/22 Follow-up with Dr. Chase Caller on 8/29 to review imaging

## 2021-03-06 NOTE — Progress Notes (Signed)
Virtual Visit via Telephone Note  I connected with Isaac Weber on 03/06/21 at  3:30 PM EDT by telephone and verified that I am speaking with the correct person using two identifiers.  Location: Patient: Home Provider: Office    I discussed the limitations, risks, security and privacy concerns of performing an evaluation and management service by telephone and the availability of in person appointments. I also discussed with the patient that there may be a patient responsible charge related to this service. The patient expressed understanding and agreed to proceed.   History of Present Illness:  75 year old male, former smoker quit 1973 (20 pack year hx).  Past medical history significant for cough variant asthma.  Patient of Dr. Chase Caller last seen June 2022.  Eosinophils 300.  Cough improved on Asmanex. HRCT in August 2021 showed no evidence of interstitial lung disease, mild paraseptal emphysema and diffuse bronchial wall thickening suggesting COPD.   Previous [facility] pulmonary encounter:  01/16/2021 -  Dr. Chase Caller  Chief Complaint  Patient presents with   Follow-up    Patient reports that he was on prednisone about 6 months and worse in last 6 months, Yellow sputum at times when coughing.    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 -presents for follow-up.  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  He tells me since the early part of this year has had frequent exacerbations of cough requiring prednisone.  He had 1 course of prednisone in January and another 1 in February and then again in March before he went to [place] and after he came back from [place].  Each of this helped him but the most recent 1 after his retirement did not help him.  He says and also developed shortness of breath particularly while playing golf.  His cough is worse when he lies down particularly on his back or on the left side.  He says cough is so bad it wakes up his wife.  His RSI  cough scores are significant deterioration.  He is not sure about wheezing.  Overall he feels deconditioned.  The cough makes him fatigued.  When he was in [place] he actually was feeling better.  He went to [place] around March 2022.  There is no fever.  He had a CT scan of the chest end of last year that showed no evidence of ILD.  Review of his labs indicate slight eosinophilia.  His nitric oxide exhaled test today was elevated at 40 ppb which is in the gray zone.  He says he is compliant with his Asmanex.  On exam he did have crackles which I thought was present for the first time.  There is no edema orthopnea  7/13/2022Ok Edwards, NP Volanda Napoleon Patient presents today for 2-4 week follow-up. During last visit, Asmanex was changed to Breo 200 and he was given prednisone taper. Ordered for RAST allergy panel and CXR.   He continues to have a cough. He is sleeping better at night. Cough is mildly productive with clear mucus. He reports associated sob with coughing fits. He is taking mucinex as needed. Allergy panel was normal, Eos 200, CXR clear. He has mild emphysema on CT imaging and diffuse bronchial wall thickening suggesting COPD.   Dr Lorenza Cambridge Reflux Symptom Index (> 13-15 suggestive of LPR cough) Results for XSAVIER, Isaac Weber (MRN [id number]) as of 05/31/2018 09:46  Ref. Range 03/09/2018 18:36 03/09/2018 18:45  Eosinophils Absolute Latest Ref Range: 0.0 - 0.7  K/uL 0.4    0 -> 5  =  none ->severe problem.td 07/08/2017  08/18/2017  02/28/2018  05/31/2018 asmanex 1 bid 02/20/2019  01/16/2021  02/04/2021   Hoarseness of problem with voice '1 2 2 3 3 1 '$ 0  Clearing  Of Throat '3 2 2 '$ 0 2 3 0  Excess throat mucus or feeling of post nasal drip 1 0 3 0 '3 5 5  '$ Difficulty swallowing food, liquid or tablets 0 0 0 0 0 0 0  Cough after eating or lying down '4 1 5 1 1 5 5  '$ Breathing difficulties or choking episodes 0 0 0 0 0 4 2  Troublesome or annoying cough '5 2 5 1 3 5 5  '$ Sensation of something sticking in throat or  lump in throat 0 0 0 0 0 0 0  Heartburn, chest pain, indigestion, or stomach acid coming up 0 0 0 0 0 0 0  TOTAL '14 7 17 5 12 23 17     '$ 03/06/2021- Interim hx  Patient contacted today for 1 month follow-up. Hx cough variant asthma with mild centrilobular emphysema. He had diffuse bronchial wall thickening suggestive of COPD. PFTs did not show overt obstruction. During last visit he was given trial Trelegy based on persistent cough. Aspect underlying component PND and GERD contributing to cough. He is already on Flonase and Astelin nasal spray. We recommended trial omeprazole '20mg'$  qd x 6 weeks.   He still has a cough. He called our office on 02/27/21 with reports of persistent cough. Dr. Chase Caller send in prednisone taper and ordered for imaging. Prednisone has helped his cough some, he has been table to sleep the last three night. Phlegm has changed from clear to dark brown. He does not have any significant sinus symptoms at the moment.  No sob, chest tightness, wheezing. He is scheduled for HRCT and CT sinuses on 03/16/21 .He has a follow-up scheduled with Dr. Chase Caller on August 29th. If imaging looks normal may want to consider trial Neurontin for cough.     Observations/Objective:  - Able to speak I full sentences; no overt shortness of breath or wheezing. Clears his throat while speak, voice sounds somewhat hoarse   Assessment and Plan:  Chronic cough: - Worsening persistent cough. Sputum has recently turned purulent. He is currently on prednisone which has helped some. Advised he start on-hand Doxycycline '100mg'$  BID x 5 days. He will call our office if we need to extend abx. He is scheduled for HRCT and CT sinuses on August 22nd and will follow-up with Dr. Brantley Persons after to review.  Mild centrilobular emphysema: - No overt obstruction of PFTs  - Fatigue has improved with Trelegy; may de-escalate at follow-up  Follow Up Instructions:   - August 29th with Dr. Chase Caller  I discussed the  assessment and treatment plan with the patient. The patient was provided an opportunity to ask questions and all were answered. The patient agreed with the plan and demonstrated an understanding of the instructions.   The patient was advised to call back or seek an in-person evaluation if the symptoms worsen or if the condition fails to improve as anticipated.  I provided 25 minutes of non-face-to-face time during this encounter.   Martyn Ehrich, NP

## 2021-03-06 NOTE — Telephone Encounter (Signed)
LVM in regards to getting patient ready for televist, please transfer call to [phone] if patient calls back.

## 2021-03-09 NOTE — Telephone Encounter (Signed)
03/25/21 is not too far off. Wil close message

## 2021-03-10 ENCOUNTER — Other Ambulatory Visit: Payer: Self-pay | Admitting: Primary Care

## 2021-03-10 ENCOUNTER — Other Ambulatory Visit: Payer: Self-pay

## 2021-03-10 ENCOUNTER — Ambulatory Visit: Payer: PPO | Admitting: Dermatology

## 2021-03-10 DIAGNOSIS — C44622 Squamous cell carcinoma of skin of right upper limb, including shoulder: Secondary | ICD-10-CM

## 2021-03-10 DIAGNOSIS — C44722 Squamous cell carcinoma of skin of right lower limb, including hip: Secondary | ICD-10-CM

## 2021-03-10 DIAGNOSIS — C4492 Squamous cell carcinoma of skin, unspecified: Secondary | ICD-10-CM

## 2021-03-10 DIAGNOSIS — L578 Other skin changes due to chronic exposure to nonionizing radiation: Secondary | ICD-10-CM

## 2021-03-10 DIAGNOSIS — D485 Neoplasm of uncertain behavior of skin: Secondary | ICD-10-CM

## 2021-03-10 HISTORY — DX: Squamous cell carcinoma of skin, unspecified: C44.92

## 2021-03-10 NOTE — Progress Notes (Signed)
Follow-Up Visit   Subjective  Isaac Weber is a 75 y.o. male who presents for the following: Growths (Right forearm and right lower leg, present for months, cryotherapy in the past, pt picks at).   The following portions of the chart were reviewed this encounter and updated as appropriate:       Review of Systems:  No other skin or systemic complaints except as noted in HPI or Assessment and Plan.  Objective  Well appearing patient in no apparent distress; mood and affect are within normal limits.  A focused examination was performed including right arm, right leg. Relevant physical exam findings are noted in the Assessment and Plan.  Right Upper Forearm 1.0 cm keratotic papule       Right Medial Lower Pretibia 1.0 cm keratotic papule        Assessment & Plan  Actinic Damage - chronic, secondary to cumulative UV radiation exposure/sun exposure over time - diffuse scaly erythematous macules with underlying dyspigmentation - Recommend daily broad spectrum sunscreen SPF 30+ to sun-exposed areas, reapply every 2 hours as needed.  - Recommend staying in the shade or wearing long sleeves, sun glasses (UVA+UVB protection) and wide brim hats (4-inch brim around the entire circumference of the hat). - Call for new or changing lesions.  Neoplasm of uncertain behavior of skin (2) Right Upper Forearm  Skin / nail biopsy Type of biopsy: tangential   Informed consent: discussed and consent obtained   Patient was prepped and draped in usual sterile fashion: Area prepped with alcohol. Anesthesia: the lesion was anesthetized in a standard fashion   Anesthetic:  1% lidocaine w/ epinephrine 1-100,000 local infiltration Instrument used: flexible razor blade   Hemostasis achieved with: pressure, aluminum chloride and electrodesiccation   Outcome: patient tolerated procedure well    Destruction of lesion  Destruction method: electrodesiccation and curettage   Informed  consent: discussed and consent obtained   Timeout:  patient name, date of birth, surgical site, and procedure verified Curettage performed in three different directions: Yes   Electrodesiccation performed over the curetted area: Yes   Lesion length (cm):  1 Lesion width (cm):  1 Margin per side (cm):  0.1 Final wound size (cm):  1.2 Hemostasis achieved with:  pressure, aluminum chloride and electrodesiccation Outcome: patient tolerated procedure well with no complications   Post-procedure details: wound care instructions given   Post-procedure details comment:  Ointment and bandage applied.  Specimen 1 - Surgical pathology Differential Diagnosis: Hypertrophic AK r/o SCC Check Margins: No 1.0 cm keratotic papule EDC today  Right Medial Lower Pretibia  Skin / nail biopsy Type of biopsy: tangential   Informed consent: discussed and consent obtained   Patient was prepped and draped in usual sterile fashion: Area prepped with alcohol. Anesthesia: the lesion was anesthetized in a standard fashion   Anesthetic:  1% lidocaine w/ epinephrine 1-100,000 local infiltration Instrument used: flexible razor blade   Hemostasis achieved with: pressure, aluminum chloride and electrodesiccation   Outcome: patient tolerated procedure well    Destruction of lesion  Destruction method: electrodesiccation and curettage   Informed consent: discussed and consent obtained   Timeout:  patient name, date of birth, surgical site, and procedure verified Curettage performed in three different directions: Yes   Electrodesiccation performed over the curetted area: Yes   Lesion length (cm):  1 Lesion width (cm):  1 Margin per side (cm):  0 Final wound size (cm):  1 Hemostasis achieved with:  pressure, aluminum chloride and  electrodesiccation Outcome: patient tolerated procedure well with no complications   Post-procedure details: wound care instructions given   Post-procedure details comment:  Ointment  and bandage applied.  Specimen 2 - Surgical pathology Differential Diagnosis: Hypertrophic AK r/o SCC Check Margins: No 1.0 cm keratotic papule EDC today  Return as scheduled with Dr Dianah Field, CMA, am acting as scribe for Brendolyn Patty, MD . Documentation: I have reviewed the above documentation for accuracy and completeness, and I agree with the above.  Brendolyn Patty MD

## 2021-03-10 NOTE — Patient Instructions (Signed)
Wound Care Instructions  Cleanse wound gently with soap and water once a day then pat dry with clean gauze. Apply a thing coat of Petrolatum (petroleum jelly, "Vaseline") over the wound (unless you have an allergy to this). We recommend that you use a new, sterile tube of Vaseline. Do not pick or remove scabs. Do not remove the yellow or white "healing tissue" from the base of the wound.  Cover the wound with fresh, clean, nonstick gauze and secure with paper tape. You may use Band-Aids in place of gauze and tape if the would is small enough, but would recommend trimming much of the tape off as there is often too much. Sometimes Band-Aids can irritate the skin.  You should call the office for your biopsy report after 1 week if you have not already been contacted.  If you experience any problems, such as abnormal amounts of bleeding, swelling, significant bruising, significant pain, or evidence of infection, please call the office immediately.  FOR ADULT SURGERY PATIENTS: If you need something for pain relief you may take 1 extra strength Tylenol (acetaminophen) AND 2 Ibuprofen (200mg each) together every 4 hours as needed for pain. (do not take these if you are allergic to them or if you have a reason you should not take them.) Typically, you may only need pain medication for 1 to 3 days.   If you have any questions or concerns for your doctor, please call our main line at [phone] and press option 4 to reach your doctor's medical assistant. If no one answers, please leave a voicemail as directed and we will return your call as soon as possible. Messages left after 4 pm will be answered the following business day.   You may also send us a message via MyChart. We typically respond to MyChart messages within 1-2 business days.  For prescription refills, please ask your pharmacy to contact our office. Our fax number is [phone].  If you have an urgent issue when the clinic is closed that  cannot wait until the next business day, you can page your doctor at the number below.    Please note that while we do our best to be available for urgent issues outside of office hours, we are not available 24/7.   If you have an urgent issue and are unable to reach us, you may choose to seek medical care at your doctor's office, retail clinic, urgent care center, or emergency room.  If you have a medical emergency, please immediately call 911 or go to the emergency department.  Pager Numbers  - Dr. Kowalski: [phone]  - Dr. Moye: [phone]  - Dr. Stewart: [phone]  In the event of inclement weather, please call our main line at [phone] for an update on the status of any delays or closures.  Dermatology Medication Tips: Please keep the boxes that topical medications come in in order to help keep track of the instructions about where and how to use these. Pharmacies typically print the medication instructions only on the boxes and not directly on the medication tubes.   If your medication is too expensive, please contact our office at [phone] option 4 or send us a message through MyChart.   We are unable to tell what your co-pay for medications will be in advance as this is different depending on your insurance coverage. However, we may be able to find a substitute medication at lower cost or fill out paperwork to get insurance to cover a needed   medication.   If a prior authorization is required to get your medication covered by your insurance company, please allow us 1-2 business days to complete this process.  Drug prices often vary depending on where the prescription is filled and some pharmacies may offer cheaper prices.  The website www.goodrx.com contains coupons for medications through different pharmacies. The prices here do not account for what the cost may be with help from insurance (it may be cheaper with your insurance), but the website can give you the  price if you did not use any insurance.  - You can print the associated coupon and take it with your prescription to the pharmacy.  - You may also stop by our office during regular business hours and pick up a GoodRx coupon card.  - If you need your prescription sent electronically to a different pharmacy, notify our office through [place] [facility] MyChart or by phone at [phone] option 4.   

## 2021-03-13 ENCOUNTER — Telehealth: Payer: Self-pay | Admitting: Family Medicine

## 2021-03-13 NOTE — Telephone Encounter (Signed)
Left message for patient to call back and schedule the Medicare Annual Wellness Visit (AWV) virtually or by telephone.  Last AWV 03/18/20  Please schedule at anytime in September with [place].  40 minute appointment  Any questions, please call me at [phone]

## 2021-03-16 ENCOUNTER — Ambulatory Visit
Admission: RE | Admit: 2021-03-16 | Discharge: 2021-03-16 | Disposition: A | Discharge status: Home / Self Care | Payer: PPO | Source: Ambulatory Visit | Attending: Internal Medicine | Admitting: Internal Medicine

## 2021-03-16 ENCOUNTER — Other Ambulatory Visit: Payer: Self-pay

## 2021-03-16 ENCOUNTER — Ambulatory Visit: Payer: PPO

## 2021-03-16 DIAGNOSIS — J439 Emphysema, unspecified: Secondary | ICD-10-CM | POA: Diagnosis not present

## 2021-03-16 DIAGNOSIS — J329 Chronic sinusitis, unspecified: Secondary | ICD-10-CM | POA: Diagnosis not present

## 2021-03-16 DIAGNOSIS — I700 Atherosclerosis of aorta: Secondary | ICD-10-CM | POA: Diagnosis not present

## 2021-03-16 DIAGNOSIS — R911 Solitary pulmonary nodule: Secondary | ICD-10-CM | POA: Diagnosis not present

## 2021-03-16 DIAGNOSIS — R053 Chronic cough: Secondary | ICD-10-CM | POA: Diagnosis not present

## 2021-03-16 DIAGNOSIS — R918 Other nonspecific abnormal finding of lung field: Secondary | ICD-10-CM | POA: Diagnosis not present

## 2021-03-18 ENCOUNTER — Telehealth: Payer: Self-pay

## 2021-03-18 NOTE — Telephone Encounter (Signed)
-----   Message from Brendolyn Patty, MD sent at 03/17/2021  7:10 PM EDT ----- 1. 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 Skin , right medial lower pretibia WELL DIFFERENTIATED SQUAMOUS CELL CARCINOMA, BASE INVOLVED  SCC skin cancer x 2- both already treated with EDC at time of biopsy   - please call patient

## 2021-03-18 NOTE — Telephone Encounter (Signed)
Advised pt of pathology results/sh 

## 2021-03-23 ENCOUNTER — Other Ambulatory Visit: Payer: Self-pay

## 2021-03-23 ENCOUNTER — Other Ambulatory Visit: Payer: Self-pay | Admitting: Internal Medicine

## 2021-03-23 ENCOUNTER — Encounter: Payer: Self-pay | Admitting: Internal Medicine

## 2021-03-23 ENCOUNTER — Ambulatory Visit: Payer: PPO | Admitting: Internal Medicine

## 2021-03-23 VITALS — BP 116/78 | HR 102 | Temp 98.4°F | Ht 68.0 in | Wt 168.8 lb

## 2021-03-23 DIAGNOSIS — J432 Centrilobular emphysema: Secondary | ICD-10-CM

## 2021-03-23 DIAGNOSIS — R053 Chronic cough: Secondary | ICD-10-CM

## 2021-03-23 DIAGNOSIS — J45991 Cough variant asthma: Secondary | ICD-10-CM

## 2021-03-23 DIAGNOSIS — J387 Other diseases of larynx: Secondary | ICD-10-CM | POA: Diagnosis not present

## 2021-03-23 NOTE — Progress Notes (Signed)
PCP Leighton Ruff, MD  HPI    IOV  07/08/2017  Chief Complaint  Patient presents with   Advice Only    Referred by Dr. Drema Dallas for cough x41month.  Pt states that his cough is  a dry cough. Pt was put on nexium x2 weeks for acid reflux which he states has loosened the mucus some.      75[place]  He presents with his wife.  He tells me that mid September 2018 he went to the beach.  Upon return from the beach his granddaughter was sick with a cough and subsequently on April 16, 2017 he abruptly developed a cough.  Since then the cough has persisted.  The only thing that has improved and his cough is that he is no longer having significant night cough although he still does have some  amount of night cough.  This night cough resolved after opioid cough syrup.  Cough is made worse by talking, laughing and also randomly.  It is associated with clearing of the throat.  He feels a tightness in the upper chest.  There is no associated wheezing but there is still some residual nocturnal symptoms.  There is no associated acid reflux.  He has been on Nexium for a week or 2 without much relief.  He has not tried anything for nasal but he denies any nasal discharge.  He is not on any ACE inhibitors.  He does not have a previous history of asthma but his exam nitric oxide today is elevated borderline at 44 ppb.  He does clear the throat.  The cough is annoying.  He tells me that he did have a chest x-ray with his primary care physician that was clear.  The history is obtained from him, talking to his wife and review of the primary care physician referral notes.  Lab review she had a hemoglobin 11.6 g% in February 2016 and a eosinophils f 300 cells.  He is a former smoker  He does not have any shortness of breath.  He walks over a mile.  Sometimes he will cough and a pro-air can help him.  He does find the PDynegyhelps him.  At this point  in time he prefers for conservative simplistic line of treatment   OV 08/18/2017  Chief Complaint  Patient presents with   Follow-up    Pt states he has been doing good since last visit. Cough is better.   Follow-up chronic cough  After last visit he decided to take Asmanex.  He also followed a diet and took Prilosec.  With this the cough is more than 60% better as documented in the RSI cough score.  He does not want to take any more new medications.  He feels prednisone burst helped him a lot in the initial few days.  He is wondering which of these measures have helped him the most.  There are no new issues.  He wants to liberalize his diet which he says is actually helping him with the sugars and with the cough but he would like to be less disciplined about food.  He is willing to continue with Asmanex   feno 43 ppb  OV 02/28/2018  Chief Complaint  Patient presents with   Follow-up    Pt states things had been doing good for him and did go off of the inhaler but stated after he had been off of  the inhaler for about a week, the cough came back in the mornings and now he states he is coughing all the time. He states he is still not taking the medication and he states he is still coughing.    Follow-up chronic cough  He is here with his wife. He tells me that approximately one month ago because he was feeling well without any cough he stopped his Asmanex and then 2 weeks ago his cough returned. RSI cough score is 17. When he lies downhe has a cough. But he does not wake up in the middle of the night because of cough. There is no associated wheezing or shortness of breath or chest tightness or any change in his health status. Social history: His wife is new diagnosis of liver cirrhosis. She is going MRI today. Exam nitric oxide is in the indeterminate range today for him. Review of the chart indicates last chest x-ray was in 2010.    OV 05/31/2018  Subjective:  Patient ID: Isaac Weber, male , DOB: Feb 19, 1946 , age 75 y.o. , MRN: [id number] , ADDRESS: [address] [place] [facility] [address]   05/31/2018 -   Chief Complaint  Patient presents with   Follow-up    Doing well at this time.     HPI Isaac Weber 75 y.o. -presents for follow-up of cough variant asthma.  Last visit we put him back on Asmanex after his cough deteriorated.  This was in August 2019.  With this his cough is significantly improved RSI cough score is 5.  He realizes the value of taking Asmanex.  He takes his Asmanex 1 puff 2 times daily.  The only interim issues that March 09, 2018 he ended up in the ER lab review shows normal.  It was for dizziness.  Socially his wife Fraser Din has had liver cyst surgery at [facility] in October 2019 and is doing well.  He is up-to-date with his flu shot but our internal immunization record shows that he has not had pneumonia vaccine.        OV 02/20/2019  Subjective:  Patient ID: Isaac Weber, male , DOB: Feb 02, 1946 , age 75 y.o. , MRN: [id number] , ADDRESS: [address] [place] [facility] [address]   02/20/2019 -   Chief Complaint  Patient presents with   Cough    Having more trouble with phlegm.     HPI Isaac Weber 75 y.o. -presents for chronic cough and cough variant asthma.  He is a former smoker 42 pack.  Last chest x-ray was August 2019.  At that time it was clear.  No previous CT scan of the chest.  He tells me that overall he is doing stable although in the last 6 weeks he has had increasing phlegm production.  It is not acute.  He has been socially isolating.  He has not come into contact with anyone with COVID.  He does go to church but mostly does [employer].  His RSI cough score shows significant deterioration as documented below.  The phlegm is not green but more like white.    OV 06/16/2020  Subjective:  Patient ID: Isaac Weber, male , DOB: 1946/02/01 , age 75 y.o. , MRN: [id number] , ADDRESS: [address] [place] [address] PCP  Parks Ranger Devonne Doughty, DO Patient Care Team: Olin Hauser, DO as PCP - General (Family Medicine) Jettie Booze, MD as PCP - Cardiology (Cardiology)  This Provider for this visit: Treatment  Team:  Attending Provider: Brand Males, MD    06/16/2020 -   Chief Complaint  Patient presents with   Follow-up    Pt states he had a flare up with asthma about 1 week ago which turned into pneumonia. Pt states he is now better. Has an occ cough which is better. Denies any complaints of wheezing or increased SOB.   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 -presents for follow-up.  Last seen in July 2020.  That was by me myself.  He presents now for follow-up.  He was supposed to go to [place] but because of Covid pandemic the history of his been postponed to March 2022.  He tells me it was the end of October 2021 he developed an asthma flare.  He is unclear why he had a flareup.  No sick contacts.  After that he played golf in [place] and this made it worse.  On number second 2020 when he had a chest x-ray with Dr. Raliegh Ip his primary care physician that showed some infiltrates.  He got antibiotics and prednisone.  This helped him.  Symptoms are improving but now he feels the cough is getting worse again.  He is wondering if it is his chronic cough getting worse.  Overall he continues to be compliant with his Asmanex.       Lab Results  Component Value Date   NITRICOXIDE 39 02/28/2018     OV 01/16/2021  Subjective:  Patient ID: Isaac Weber, male , DOB: December 26, 1945 , age 10 y.o. , MRN: [id number] , ADDRESS: [address] [place] [facility] [address] PCP Parks Ranger Devonne Doughty, DO Patient Care Team: Olin Hauser, DO as PCP - General (Family Medicine) Jettie Booze, MD as PCP - Cardiology (Cardiology)  This Provider for this visit: Treatment  Team:  Attending Provider: Brand Males, MD    01/16/2021 -   Chief Complaint  Patient presents with   Follow-up    Patient reports that he was on prednisone about 6 months and worse in last 6 months, Yellow sputum at times when coughing.    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 -presents for follow-up.  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  He tells me since the early part of this year has had frequent exacerbations of cough requiring prednisone.  He had 1 course of prednisone in January and another 1 in February and then again in March before he went to [place] and after he came back from [place].  Each of this helped him but the most recent 1 after his retirement did not help him.  He says and also developed shortness of breath particularly while playing golf.  His cough is worse when he lies down particularly on his back or on the left side.  He says cough is so bad it wakes up his wife.  His RSI cough scores are significant deterioration.  He is not sure about wheezing.  Overall he feels deconditioned.  The cough makes him fatigued.  When he was in [place] he actually was feeling better.  He went to [place] around March 2022.  There is no fever.  He had a CT scan of the chest end of last year that showed no evidence of ILD.  Review of his labs indicate slight eosinophilia.  His nitric oxide exhaled test today was  elevated at 40 ppb which is in the gray zone.  He says he is compliant with his Asmanex.  On exam he did have crackles which I thought was present for the first time.  There is no edema orthopnea    Simple office walk 185 feet x  3 laps goal with forehead probe 01/16/2021   O2 used ra  Number laps completed 3  Comments about pace fast  Resting Pulse Ox/HR 98% and 96/min  Final Pulse Ox/HR 98% and 99/min  Desaturated </= 88% no  Desaturated <= 3% points no  Got Tachycardic >/= 90/min yes  Symptoms at end of test Mild ydspnea  Miscellaneous  comments x   Results for KUSH, MAULLER" (MRN [id number]) as of 01/16/2021 11:16  Ref. Range 03/09/2018 18:36 03/09/2018 18:45 06/02/2020 08:52 10/01/2020 09:26  Eosinophils Absolute Latest Ref Range: 15 - 500 cells/uL 0.4  205    Lab Results  Component Value Date   NITRICOXIDE 40 01/16/2021     IMPRESSION: HRCT Dec 2021 1. No evidence of interstitial lung disease. No acute pulmonary disease. 2. Stable 4 mm left upper lobe solid pulmonary nodule, considered benign. 3. Mild centrilobular and paraseptal emphysema with diffuse bronchial wall thickening and saber sheath trachea, compatible with the provided history of COPD. 4. Two vessel coronary atherosclerosis. 5. Aortic Atherosclerosis (ICD10-I70.0) and Emphysema (ICD10-J43.9).     Electronically Signed   By: Ilona Sorrel M.D.   On: 07/24/2020 11:08     ROS - per HPI   OV 03/23/2021  Subjective:  Patient ID: Isaac Weber, male , DOB: Sep 18, 1945 , age 17 y.o. 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 -returns for follow-up.  In the year 2022 his cough is dramatically escalated.  It is now chronic.  He had several phone calls and phone messages because of this.  He see nurse practitioner 2 times.  2 visits ago the nurse practitioner started several nasal sprays and Breo but despite all this the cough persists unchanged.  She even added omeprazole but the  cough persist unchanged.  He has upcoming GI appointment locally.  He is frustrated by this.  Wife is here with him.  This is a social embarrassment and also concern in the family.  He has not seen ENT at all.  We recommended [facility] but it is 41 miles away from his home.  He is willing to see Dr. Blenda Nicely here in [place] who has expertise in chronic cough.  But he also wants to go through his GI appointment.  We discussed trial of asthma biologic empiric in case his refractory cough is because of uncontrolled asthma.  Is because he does cough at night and also brings up colored sputum occasionally.  We discussed the other option of doing gabapentin.  He wants to think about all this at this point.  He is willing to go through Dr. Thomasene Lot.  He is worried about side effects of medications.  We could consider sputum evaluation at next visit.  There are no other new issues.  He did have imaging CT sinus and CT chest other than micronodules and some emphysematous allCLEAR.       Dr Lorenza Cambridge Reflux Symptom Index (> 13-15 suggestive of LPR cough) Results for KEELY, HULVEY (MRN [id number]) as of 05/31/2018 09:46  Ref. Range 03/09/2018 18:36 03/09/2018 18:45  Eosinophils Absolute Latest Ref Range: 0.0 - 0.7 K/uL 0.4    0 -> 5  =  none ->severe problem.td 07/08/2017  08/18/2017  02/28/2018  05/31/2018 asmanex 1 bid 02/20/2019  01/16/2021  03/23/2021   Hoarseness of problem with voice '1 2 2 3 3 1 1  '$ Clearing  Of Throat '3 2 2 '$ 0 '2 3 2  '$ Excess throat mucus or feeling of post nasal drip 1 0 3 0 '3 5 4  '$ Difficulty swallowing food, liquid or tablets 0 0 0 0 0 0 0  Cough after eating or lying down '4 1 5 1 1 5 5  '$ Breathing difficulties or choking episodes 0 0 0 0 0 4 3  Troublesome or annoying cough '5 2 5 1 3 5 5  '$ Sensation of something sticking in throat or lump in throat 0 0 0 0 0 0 0  Heartburn, chest pain, indigestion, or stomach acid coming up 0 0 0 0 0 0 2  TOTAL '14 7 17 5 12 23 22      '$ Lab Results  Component Value Date   NITRICOXIDE 40 01/16/2021     PFT  PFT Results Latest Ref Rng & Units 01/27/2021  FVC-Pre L 3.31  FVC-Predicted Pre % 84  FVC-Post L 3.48  FVC-Predicted Post % 89  Pre FEV1/FVC % % 82  Post FEV1/FCV % % 78  FEV1-Pre L 2.70  FEV1-Predicted Pre % 95  FEV1-Post L 2.73  DLCO uncorrected ml/min/mmHg 28.84  DLCO UNC% % 122  DLCO corrected ml/min/mmHg 29.53  DLCO COR %Predicted % 125  DLVA Predicted % 120  TLC L 6.62  TLC % Predicted % 99  RV % Predicted % 95    IMPRESSION: 1. No definitive imaging findings to suggest interstitial lung disease. There are subtle changes in the lung bases which are nonspecific, but could suggest sequela of mild recurrent aspiration. If there is persistent clinical concern for interstitial lung disease, repeat high-resolution chest CT could be considered in 12 months to assess for temporal changes in the appearance of the lung parenchyma. 2. Mild diffuse bronchial wall thickening with mild centrilobular and paraseptal emphysema; imaging findings suggestive of underlying COPD. 3. Multiple tiny 2-3 mm pulmonary nodules scattered throughout the periphery of the lungs bilaterally, nonspecific, but statistically likely to represent benign areas of mucoid impaction within terminal bronchioles. No follow-up needed if patient is low-risk (and has no known or suspected primary neoplasm). Non-contrast chest CT can be considered in 12 months if patient is high-risk. This recommendation follows the consensus statement: Guidelines for Management of Incidental Pulmonary Nodules Detected on CT Images: From the Fleischner Society 2017; Radiology 2017; 284:228-243. 4. Aortic atherosclerosis, in addition to left main and 3 vessel coronary artery disease. Assessment for potential risk factor modification, dietary therapy or pharmacologic therapy may be warranted, if clinically indicated. 5. There are calcifications of  the mitral annulus. Echocardiographic correlation for evaluation of potential valvular dysfunction may be warranted if clinically indicated.   Aortic Atherosclerosis (ICD10-I70.0) and Emphysema (ICD10-J43.9).  Electronically Signed   By: Vinnie Langton M.D.   On: 03/17/2021 13:02   Xxxxxxx IMPRESSION: Mucosal thickening in the right frontal sinus, ethmoid air cells, and inferior maxillary sinuses. The right frontoethmoidal recess and left ostiomeatal unit are occluded.     Electronically Signed   By: Merilyn Baba M.D.   On: 03/17/2021 15:31   has a past medical history of Abnormal chest CT, Allergic rhinitis, unspecified, Anemia, Aortic atherosclerosis ([place]), BPH (benign prostatic hyperplasia), COPD (chronic obstructive pulmonary disease) ([place]), Coronary artery disease, Cough variant asthma, Glaucoma, Hypertension, Other chronic pain, Pancreatic lesion, Pure hypercholesterolemia, unspecified, SCC (squamous cell carcinoma) (03/10/2021), SCC (squamous cell carcinoma) (03/10/2021), 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   reports that he quit smoking about 49 years ago. His smoking use included cigarettes. He has a 78.00 pack-year smoking history. He has never used smokeless tobacco.  Past Surgical History:  Procedure Laterality Date   BUNIONECTOMY Left 2003   hammer toe as well   BUNIONECTOMY WITH HAMMERTOE RECONSTRUCTION Left 2013   repeat   CERVICAL DISC SURGERY     GREEN LIGHT LASER TURP (TRANSURETHRAL RESECTION OF PROSTATE  2010   laser, shrink prostate   KNEE ARTHROSCOPY Right 1991   LEG SURGERY Left    BENIGN BONE TUMOR   LUMBAR [place] SURGERY  2010   discectomy   LUMBAR DISC SURGERY  2011   PROSTATE SURGERY  2002   shrink prostate   ROTATOR CUFF REPAIR Left 1999   debride, remove bonespur   ROTATOR CUFF REPAIR Right 1996    Allergies  Allergen Reactions   Ciprofloxacin Diarrhea and Nausea And Vomiting    Duloxetine Other (See Comments)    Severe fatique and constipation.   Morphine And Related Diarrhea and Nausea And Vomiting    Immunization History  Administered Date(s) Administered   Fluad Quad(high Dose 65+) 05/13/2019, 06/09/2020   Influenza, High Dose Seasonal PF 07/15/2017, 04/12/2018   PFIZER(Purple Top)SARS-COV-2 Vaccination 02/13/2020, 03/05/2020   Pneumococcal Conjugate-13 01/28/2014   Pneumococcal Polysaccharide-23 03/04/2008, 08/26/2020   Tdap 07/12/2011   Zoster Recombinat (Shingrix) 08/17/2017    Family History  Problem Relation Age of Onset   Heart disease Mother 53   Emphysema Father 70   Heart disease Brother 2     Current Outpatient Medications:    albuterol (VENTOLIN HFA) 108 (90 Base) MCG/ACT inhaler, Inhale 1-2 puffs into the lungs every 4 (four) hours as needed for wheezing or shortness of breath (cough)., Disp: 1 each, Rfl: 2   amLODipine (NORVASC) 5 MG tablet, Take 1 tablet (5 mg total) by mouth daily., Disp: 90 tablet, Rfl: 3   azelastine (ASTELIN) 0.1 % nasal spray, Place 1 spray into both nostrils 2 (two) times daily. Use in each nostril as directed, Disp: 30 mL, Rfl: 2   Calcium Carb-Cholecalciferol (CALCIUM + D3 PO), Take by mouth., Disp: , Rfl:    diclofenac Sodium (VOLTAREN) 1 % GEL, Apply topically 4 (four) times daily., Disp: , Rfl:    ferrous sulfate 325 (65 FE) MG tablet, Take 325 mg by mouth daily with breakfast., Disp: , Rfl:    fluticasone (FLONASE) 50 MCG/ACT nasal spray, USE ONE SPRAY IN EACH NOSTRIL DAILY, Disp: 48 g, Rfl: 3   Fluticasone-Umeclidin-Vilant (TRELEGY ELLIPTA) 200-62.5-25 MCG/INH AEPB, Inhale 1 puff into the lungs daily., Disp: 60 each, Rfl: 1   Fluticasone-Umeclidin-Vilant (TRELEGY ELLIPTA) 200-62.5-25 MCG/INH AEPB, Inhale 1 puff into the lungs daily., Disp: 1 each, Rfl: 0  FREESTYLE LITE test strip, Use to check blood sugar up to 2 x daily, Disp: 100 each, Rfl: 5   Ginger, Zingiber officinalis, (GINGER PO), Take by  mouth., Disp: , Rfl:    Glucosamine-Chondroitin (GLUCOSAMINE CHONDR COMPLEX PO), Take by mouth 2 (two) times daily. , Disp: , Rfl:    hydrochlorothiazide (HYDRODIURIL) 12.5 MG tablet, Take 1 tablet (12.5 mg total) by mouth daily., Disp: 90 tablet, Rfl: 3   ibuprofen (ADVIL,MOTRIN) 200 MG tablet, Take 200-800 mg by mouth every 6 (six) hours as needed for fever, headache, mild pain, moderate pain or cramping. , Disp: , Rfl:    ketoconazole (NIZORAL) 2 % shampoo, SHAMPOO INTO SCALP, LET SIT FOR 10 MINUTES THEN WASH OFF. USE 3 TIMES PER WEEK, Disp: 120 mL, Rfl: 3   loratadine (CLARITIN) 10 MG tablet, Take 10 mg by mouth daily., Disp: , Rfl:    losartan (COZAAR) 50 MG tablet, Take 1 tablet (50 mg total) by mouth daily., Disp: 90 tablet, Rfl: 3   metFORMIN (GLUCOPHAGE-XR) 500 MG 24 hr tablet, Take 1 tablet (500 mg total) by mouth at bedtime., Disp: 90 tablet, Rfl: 3   Multiple Vitamin (MULTIVITAMIN WITH MINERALS) TABS tablet, Take 1 tablet by mouth daily., Disp: , Rfl:    omeprazole (PRILOSEC) 20 MG capsule, Take 1 capsule (20 mg total) by mouth daily., Disp: 30 capsule, Rfl: 1   rosuvastatin (CRESTOR) 5 MG tablet, Take 1 tablet (5 mg total) by mouth daily., Disp: 90 tablet, Rfl: 3   tamsulosin (FLOMAX) 0.4 MG CAPS capsule, Take 1 capsule (0.4 mg total) by mouth daily., Disp: 90 capsule, Rfl: 3      Objective:   Vitals:   03/23/21 1350  BP: 116/78  Pulse: (!) 102  Temp: 98.4 F (36.9 C)  TempSrc: Oral  SpO2: 98%  Weight: 168 lb 12.8 oz (76.6 kg)  Height: '5\' 8"'$  (1.727 m)    Estimated body mass index is 25.67 kg/m as calculated from the following:   Height as of this encounter: '5\' 8"'$  (1.727 m).   Weight as of this encounter: 168 lb 12.8 oz (76.6 kg).  '@WEIGHTCHANGE'$ @  [employer]   03/23/21 1350  Weight: 168 lb 12.8 oz (76.6 kg)     Physical Exam    General: No distress. Looks well Neuro: Alert and Oriented x 3. GCS 15. Speech normal Psych: Pleasant Resp:  Barrel Chest - no.   Wheeze - no, Crackles - no, No overt respiratory distress CVS: Normal heart sounds. Murmurs - no Ext: Stigmata of Connective Tissue Disease - no HEENT: Normal upper airway. PEERL +. No post nasal drip        Assessment:       ICD-10-CM   1. Chronic cough  R05.3     2. Centrilobular emphysema ([place])  J43.2     3. Cough variant asthma  J45.991     4. Irritable larynx  J38.7          Plan:     Patient Instructions  Most all of your testing is reassuring.   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  Other possibility is acid reflux despite being on omeprazole since June 2022  Recommendations: Sputum for gram stain and culture - if you can Continue Trelegy  as before  Continue omeprazole '20mg'$  once daily in the morning  -Keep up with GI appointment in [place]  -Based on results of GI evaluation you can stop omeprazole based on their advice Refer ENT Dr. Lind Guest in [place] flonase nasal spray once daily  Continue Astelin nasal 1 spray per nostril twice a day  Continue ocean saline nasal spray twice a day Continue mucinex '600mg'$  twice daily Other nonpharmaceutical interventions  -Every time he feel the urge to cough drink water or suck on sugarless lozenge or swallow his saliva  -Take 2-3 days completely off and do not whisper or talk  Follow-up - Return in 2 months but after completing the above  -If diagnostic work-up is noncontributory and cough still persists options to consider time-limited trials gabapentin [we extensively discussed the side effect profile of this] or doing biologic injection such as Tepelezumab for asthma    SIGNATURE    Dr. Brand Males, M.D., F.C.C.P,  Pulmonary and Critical Care Medicine Staff  Physician, [place] Director - Interstitial Lung Disease  Program  Pulmonary [place] [facility] [place], [place], [address]  Pager: [phone], If no answer or between  15:00h - 7:00h: call [phone] Telephone: [phone]  2:43 PM 03/23/2021

## 2021-03-23 NOTE — Addendum Note (Signed)
Addended by: Suzzanne Cloud E on: 03/23/2021 02:53 PM   Modules accepted: Orders

## 2021-03-23 NOTE — Patient Instructions (Addendum)
Most all of your testing is reassuring.   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  Other possibility is acid reflux despite being on omeprazole since June 2022  Recommendations: Sputum for gram stain and culture - if you can Continue Trelegy as before  Continue omeprazole '20mg'$  once daily in the morning  -Keep up with GI appointment in [place]  -Based on results of GI evaluation you can stop omeprazole based on their advice Refer ENT Dr. Lind Guest in [place] flonase nasal spray once daily  Continue Astelin nasal 1 spray per nostril twice a day  Continue ocean saline nasal spray twice a day Continue mucinex '600mg'$  twice daily Other nonpharmaceutical interventions  -Every time he feel the urge to cough drink water or suck on sugarless lozenge or swallow his saliva  -Take 2-3 days completely off and do not whisper or talk  Follow-up - Return in 2 months but after completing the above  -If diagnostic work-up is noncontributory and cough still persists options to consider time-limited trials gabapentin [we extensively discussed the side effect profile of this] or doing biologic injection such as Tepelezumab for asthma

## 2021-03-23 NOTE — Addendum Note (Signed)
Addended by: Gavin Potters R on: 03/23/2021 02:46 PM   Modules accepted: Orders

## 2021-03-24 ENCOUNTER — Other Ambulatory Visit: Payer: Self-pay | Admitting: Primary Care

## 2021-03-25 ENCOUNTER — Ambulatory Visit: Payer: PPO | Admitting: Gastroenterology

## 2021-03-26 LAB — RESPIRATORY CULTURE OR RESPIRATORY AND SPUTUM CULTURE
MICRO NUMBER:: 12303994
SPECIMEN QUALITY:: ADEQUATE

## 2021-03-27 NOTE — Telephone Encounter (Signed)
Dr. Chase Caller please advise on the following My Chart message:   I realize My Chart can be a Blessing and a curse. I'm wondering if Doctor has any comment on results of the phlegm or spit test. If he thinks it could be contributing and or causing my cough.  I have just a couple of hours to go of not talking. The results other than making my wife happy have had no effect. The phlegm seems to be increasing. Sorry for not being patient. I hope you enjoy your weekend. Thanks.   Thank you

## 2021-04-01 NOTE — Telephone Encounter (Signed)
Hello Dr. Chase Caller, please advise on mychart message below:  On September  2nd I sent a message about Doctor's opinion on the Phlegm and or Spit test. As of today I haven't had a response. I also mention my appointment with Doctor Vicente Males the GI doctor had to be rescheduled for September 19th.  From what I understood Dr. Chase Caller said he wanted me to see the ENT doctor which was going to be at set up on [facility] in [place] because [facility] office is too far from where I live.  So far I haven't heard anything about that.

## 2021-04-02 ENCOUNTER — Telehealth: Payer: Self-pay | Admitting: Internal Medicine

## 2021-04-02 DIAGNOSIS — J45991 Cough variant asthma: Secondary | ICD-10-CM

## 2021-04-02 NOTE — Telephone Encounter (Signed)
1. Noted his gi appt is 9/19 and that is fine 2. Pleaes ensure ENT referral to Dr Lind Guest for cough 3. Sputum culture 8/29 /22 with pseudomonas - sorry for delay on getting this to him but with long weekend and busy ICU rotation - lot of catch up. Might be colonizer and could be playing a role in cough  -  take cipro '750mg'$  bid x 12 days (watch for any tendon pain) biut this this the best oral agent for it (his QTC was normal  Nov 2021)     SIGNATURE    Dr. Brand Males, M.D., F.C.C.P,  Pulmonary and Critical Care Medicine Staff Physician, [place] Director - Interstitial Lung Disease  Program  Pulmonary [place] at [place], [place], [address]  NPI Number:  [id number]  Pager: [phone], If no answer  -> Check AMION or Try [phone] Telephone (clinical office): [phone] Telephone (research): [phone]  6:25 PM 04/02/2021

## 2021-04-03 ENCOUNTER — Other Ambulatory Visit: Payer: Self-pay | Admitting: *Deleted

## 2021-04-03 MED ORDER — CIPROFLOXACIN HCL 750 MG PO TABS: 750.0000 mg | ORAL_TABLET | Freq: Two times a day (BID) | ORAL | 0 refills | Status: DC

## 2021-04-03 NOTE — Telephone Encounter (Signed)
Called and spoke with pt letting him know that the cipro had been sent to pharmacy for him and he verbalized understanding. Nothing further needed.

## 2021-04-03 NOTE — Telephone Encounter (Signed)
Call returned to patient, confirmed DOB. Made aware of results and recommendations per MR. Patient states he was taking morphine and cipro at the same time and he lost 15lbs with vomiting and diarrhea. He is not sure which caused the reaction but does not feel comfortable taking cipro and would like something else if possible.   MR please advise. Patient has a allergy to cipro.   Patient would like [place] to call his cell when we call him back. [phone].

## 2021-04-03 NOTE — Telephone Encounter (Signed)
MR, please advise as pt has called back.

## 2021-04-03 NOTE — Telephone Encounter (Signed)
Per Uptodate"iarrhea (2% to 5%), dyspepsia (1% to 3%), nausea (3% to 4%), vomiting (1% to 5%)  I personally spoke to Isaac Weber at 3:12 PM 04/03/2021 -over 7 years ago he got Cipro and morphine after rotator cuff surgery and lost weight.  And after discussing the above , were not sure if the Cipro ended because of the diarrhea.  Went over highly important side effects of Cipro such as tendinitis and QT prolongation confusion and myopathy.  He said he will keep a note of all this while taking Cipro  Plan - We took a shared decision making that he will take Cipro.  Please send it to [facility] in [place] at previous phone message for dosing

## 2021-04-07 ENCOUNTER — Ambulatory Visit (INDEPENDENT_AMBULATORY_CARE_PROVIDER_SITE_OTHER): Payer: PPO

## 2021-04-07 VITALS — Ht 69.0 in | Wt 168.0 lb

## 2021-04-07 DIAGNOSIS — Z0000 Encounter for general adult medical examination without abnormal findings: Secondary | ICD-10-CM | POA: Diagnosis not present

## 2021-04-07 NOTE — Progress Notes (Signed)
I connected with Isaac Weber today by telephone and verified that I am speaking with the correct person using two identifiers. Location patient: home Location provider: work Persons participating in the virtual visit: Devron, [place] LPN.   I discussed the limitations, risks, security and privacy concerns of performing an evaluation and management service by telephone and the availability of in person appointments. I also discussed with the patient that there may be a patient responsible charge related to this service. The patient expressed understanding and verbally consented to this telephonic visit.    Interactive audio and video telecommunications were attempted between this provider and patient, however failed, due to patient having technical difficulties OR patient did not have access to video capability.  We continued and completed visit with audio only.     Vital signs may be patient reported or missing.  Subjective:   Isaac Weber is a 75 y.o. male who presents for Medicare Annual/Subsequent preventive examination.  Review of Systems     Cardiac Risk Factors include: advanced age (>57mn, >>67women);diabetes mellitus;dyslipidemia;hypertension;male gender     Objective:    Today's Vitals   04/07/21 1356  Weight: 168 lb (76.2 kg)  Height: '5\' 9"'$  (1.753 m)   Body mass index is 24.81 kg/m.  Advanced Directives 04/07/2021 03/18/2020 03/09/2018 09/16/2014  Does Patient Have a Medical Advance Directive? Yes Yes No No;Yes  Type of AParamed[place]of [place] will [place] will - [place]  [place] of [place]? No - copy requested No - copy requested - No - copy requested  Would patient like information on creating a medical advance directive? - - No - Patient declined -    Current Medications (verified) Outpatient Encounter Medications as of 04/07/2021   Medication Sig   albuterol (VENTOLIN HFA) 108 (90 Base) MCG/ACT inhaler Inhale 1-2 puffs into the lungs every 4 (four) hours as needed for wheezing or shortness of breath (cough).   amLODipine (NORVASC) 5 MG tablet Take 1 tablet (5 mg total) by mouth daily.   azelastine (ASTELIN) 0.1 % nasal spray Place 1 spray into both nostrils 2 (two) times daily. Use in each nostril as directed   ciprofloxacin (CIPRO) 750 MG tablet Take 1 tablet (750 mg total) by mouth 2 (two) times daily.   fluticasone (FLONASE) 50 MCG/ACT nasal spray USE ONE SPRAY IN EACH NOSTRIL DAILY   Fluticasone-Umeclidin-Vilant (TRELEGY ELLIPTA) 200-62.5-25 MCG/INH AEPB Inhale 1 puff into the lungs daily.   Fluticasone-Umeclidin-Vilant (TRELEGY ELLIPTA) 200-62.5-25 MCG/INH AEPB Inhale 1 puff into the lungs daily.   FREESTYLE LITE test strip Use to check blood sugar up to 2 x daily   Ginger, Zingiber officinalis, (GINGER PO) Take by mouth.   hydrochlorothiazide (HYDRODIURIL) 12.5 MG tablet Take 1 tablet (12.5 mg total) by mouth daily.   ibuprofen (ADVIL,MOTRIN) 200 MG tablet Take 200-800 mg by mouth every 6 (six) hours as needed for fever, headache, mild pain, moderate pain or cramping.    ketoconazole (NIZORAL) 2 % shampoo SHAMPOO INTO SCALP, LET SIT FOR 10 MINUTES THEN WASH OFF. USE 3 TIMES PER WEEK   loratadine (CLARITIN) 10 MG tablet Take 10 mg by mouth daily.   losartan (COZAAR) 50 MG tablet Take 1 tablet (50 mg total) by mouth daily.   metFORMIN (GLUCOPHAGE-XR) 500 MG 24 hr tablet Take 1 tablet (500 mg total) by mouth at bedtime.   rosuvastatin (CRESTOR) 5 MG tablet Take 1 tablet (5  mg total) by mouth daily.   tamsulosin (FLOMAX) 0.4 MG CAPS capsule Take 1 capsule (0.4 mg total) by mouth daily.   TRELEGY ELLIPTA 200-62.5-25 MCG/INH AEPB INHALE 1 PUFF BY MOUTH INTO LUNGS DAILY   Calcium Carb-Cholecalciferol (CALCIUM + D3 PO) Take by mouth. (Patient not taking: Reported on 04/07/2021)   diclofenac Sodium (VOLTAREN) 1 % GEL Apply  topically 4 (four) times daily. (Patient not taking: Reported on 04/07/2021)   ferrous sulfate 325 (65 FE) MG tablet Take 325 mg by mouth daily with breakfast. (Patient not taking: Reported on 04/07/2021)   Glucosamine-Chondroitin (GLUCOSAMINE CHONDR COMPLEX PO) Take by mouth 2 (two) times daily.  (Patient not taking: Reported on 04/07/2021)   Multiple Vitamin (MULTIVITAMIN WITH MINERALS) TABS tablet Take 1 tablet by mouth daily. (Patient not taking: Reported on 04/07/2021)   omeprazole (PRILOSEC) 20 MG capsule TAKE ONE CAPSULE BY MOUTH ONCE DAILY (Patient not taking: Reported on 04/07/2021)   No facility-administered encounter medications on file as of 04/07/2021.    Allergies (verified) Ciprofloxacin, Duloxetine, and Morphine and related   History: Past Medical History:  Diagnosis Date   Abnormal chest CT    Allergic rhinitis, unspecified    Anemia    Aortic atherosclerosis (HCC)    BPH (benign prostatic hyperplasia)    COPD (chronic obstructive pulmonary disease) (HCC)    Coronary artery disease    Cough variant asthma    Glaucoma    Hypertension    Other chronic pain    Pancreatic lesion    Pure hypercholesterolemia, unspecified    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 WD SCC, ulcerated. [facility] 04/29/2020   Past Surgical History:  Procedure Laterality Date   BUNIONECTOMY Left 2003   hammer toe as well   BUNIONECTOMY WITH HAMMERTOE RECONSTRUCTION Left 2013   repeat   CERVICAL DISC SURGERY     GREEN LIGHT LASER TURP (TRANSURETHRAL RESECTION OF PROSTATE  2010   laser, shrink prostate   KNEE ARTHROSCOPY Right 1991   LEG SURGERY Left    BENIGN BONE TUMOR   LUMBAR [place] SURGERY  2010   discectomy    LUMBAR DISC SURGERY  2011   PROSTATE SURGERY  2002   shrink prostate   ROTATOR CUFF REPAIR Left 1999   debride, remove bonespur   ROTATOR CUFF REPAIR Right 1996   Family History  Problem Relation Age of Onset   Heart disease Mother 71   Emphysema Father 26   Heart disease Brother 41   Social History   Socioeconomic History   Marital status: Married    Spouse name: Not on file   Number of children: Not on file   Years of education: high school   Highest education level: High school graduate  Occupational History   Occupation: retired  Tobacco Use   Smoking status: Former    Packs/day: 3.00    Years: 26.00    Pack years: 78.00    Types: Cigarettes    Quit date: 09/17/1971    Years since quitting: 49.5   Smokeless tobacco: Never  Vaping Use   Vaping Use: Never used  Substance and Sexual Activity   Alcohol use: Yes    Alcohol/week:  2.0 standard drinks    Types: 2 Standard drinks or equivalent per week   Drug use: No   Sexual activity: Not on file  Other Topics Concern   Not on file  Social History Narrative   Not on file   Social Determinants of Health   Financial Resource Strain: Low Risk    Difficulty of Paying Living Expenses: Not hard at all  Food Insecurity: No Food Insecurity   Worried About Charity fundraiser in the Last Year: Never true   [place] in the Last Year: Never true  Transportation Needs: No Transportation Needs   Lack of Transportation (Medical): No   Lack of Transportation (Non-Medical): No  Physical Activity: Insufficiently Active   Days of Exercise per Week: 7 days   Minutes of Exercise per Session: 20 min  Stress: No Stress Concern Present   Feeling of Stress : Not at all  Social Connections: Not on file    Tobacco Counseling Counseling given: Not Answered   Clinical Intake:  Pre-visit preparation completed: Yes  Pain : No/denies pain     Nutritional Status: BMI of 19-24  Normal Nutritional Risks: None Diabetes:  Yes  How often do you need to have someone help you when you read instructions, pamphlets, or other written materials from your doctor or pharmacy?: 1 - Never What is the last grade level you completed in school?: 12th grade  Diabetic? Yes Nutrition Risk Assessment:  Has the patient had any N/V/D within the last 2 months?  No  Does the patient have any non-healing wounds?  No  Has the patient had any unintentional weight loss or weight gain?  No   Diabetes:  Is the patient diabetic?  Yes  If diabetic, was a CBG obtained today?  No  Did the patient bring in their glucometer from home?  No  How often do you monitor your CBG's? daily.   Financial Strains and Diabetes Management:  Are you having any financial strains with the device, your supplies or your medication? No .  Does the patient want to be seen by Chronic Care Management for management of their diabetes?  No  Would the patient like to be referred to a Nutritionist or for Diabetic Management?  No   Diabetic Exams:  Diabetic Eye Exam: Completed 10/2020 Diabetic Foot Exam: Overdue, Pt has been advised about the importance in completing this exam. Pt is scheduled for diabetic foot exam on next appointment.   Interpreter Needed?: No  Information entered by :: NAllen LPN   Activities of Daily Living In your present state of health, do you have any difficulty performing the following activities: 04/07/2021 12/15/2020  Hearing? N N  Vision? N N  Difficulty concentrating or making decisions? N N  Walking or climbing stairs? N N  Dressing or bathing? N N  Doing errands, shopping? N N  Preparing Food and eating ? N -  Using the Toilet? N -  In the past six months, have you accidently leaked urine? N -  Do you have problems with loss of bowel control? N -  Managing your Medications? N -  Managing your Finances? N -  Housekeeping or managing your Housekeeping? N -  Some recent data might be hidden    Patient Care  Team: Olin Hauser, DO as PCP - General (Family Medicine) Jettie Booze, MD as PCP - Cardiology (Cardiology)  Indicate any recent Medical Services you may have received from other than  [facility] providers in the past year (date may be approximate).     Assessment:   This is a routine wellness examination for [place].  Hearing/Vision screen Vision Screening - Comments:: Regular eye exams, Dr. Clydene Laming  Dietary issues and exercise activities discussed: Current Exercise Habits: Home exercise routine, Type of exercise: stretching, Time (Minutes): 15, Frequency (Times/Week): 7, Weekly Exercise (Minutes/Week): 105   Goals Addressed             This Visit's Progress    Patient Stated       04/07/2021, maintain weight       Depression Screen PHQ 2/9 Scores 04/07/2021 12/15/2020 06/09/2020 03/18/2020 01/11/2020  PHQ - 2 Score 0 0 0 0 0  PHQ- 9 Score - 0 - - -    Fall Risk Fall Risk  04/07/2021 12/15/2020 06/09/2020 03/18/2020 01/11/2020  Falls in the past year? 1 0 0 0 0  Comment stepped in a hole - - - -  Number falls in past yr: 0 0 0 - 0  Injury with Fall? 0 0 0 - 0  Comment bruising - - - -  Risk for fall due to : Medication side effect No Fall Risks - Medication side effect -  Follow up Falls evaluation completed;Education provided;Falls prevention discussed Falls evaluation completed Falls evaluation completed Falls evaluation completed;Education provided;Falls prevention discussed Falls evaluation completed    FALL RISK PREVENTION PERTAINING TO THE HOME:  Any stairs in or around the home? No  If so, are there any without handrails? N/a Home free of loose throw rugs in walkways, pet beds, electrical cords, etc? Yes  Adequate lighting in your home to reduce risk of falls? Yes   ASSISTIVE DEVICES UTILIZED TO PREVENT FALLS:  Life alert? No  Use of a cane, walker or w/c? No  Grab bars in the bathroom? Yes  Shower chair or bench in shower? Yes  Elevated toilet seat  or a handicapped toilet? Yes   TIMED UP AND GO:  Was the test performed? No .      Cognitive Function:     6CIT Screen 04/07/2021 03/18/2020  What Year? 0 points 0 points  What month? 0 points 0 points  What time? 0 points 0 points  Count back from 20 0 points 0 points  Months in reverse 0 points 0 points  Repeat phrase 4 points 4 points  Total Score 4 4    Immunizations Immunization History  Administered Date(s) Administered   Fluad Quad(high Dose 65+) 05/13/2019, 06/09/2020   Influenza, High Dose Seasonal PF 07/15/2017, 04/12/2018   PFIZER(Purple Top)SARS-COV-2 Vaccination 02/13/2020, 03/05/2020   Pneumococcal Conjugate-13 01/28/2014   Pneumococcal Polysaccharide-23 03/04/2008, 08/26/2020   Tdap 07/12/2011   Zoster Recombinat (Shingrix) 04/12/2017, 08/17/2017    TDAP status: Up to date  Flu Vaccine status: Due, Education has been provided regarding the importance of this vaccine. Advised may receive this vaccine at local pharmacy or Health Dept. Aware to provide a copy of the vaccination record if obtained from local pharmacy or Health Dept. Verbalized acceptance and understanding.  Pneumococcal vaccine status: Up to date  Covid-19 vaccine status: Completed vaccines  Qualifies for Shingles Vaccine? Yes   Zostavax completed No   Shingrix Completed?: Yes  Screening Tests Health Maintenance  Topic Date Due   OPHTHALMOLOGY EXAM  10/16/2020   COLONOSCOPY (Pts 45-25yr Insurance coverage will need to be confirmed)  10/28/2020   FOOT EXAM  11/15/2020   INFLUENZA VACCINE  04/22/2021 (Originally 02/23/2021)   COVID-19 Vaccine (  3 - Pfizer risk series) 04/23/2021 (Originally 04/02/2020)   Hepatitis C Screening  06/09/2024 (Originally 02/23/1964)   HEMOGLOBIN A1C  06/17/2021   TETANUS/TDAP  07/11/2021   PNA vac Low Risk Adult  Completed   Zoster Vaccines- Shingrix  Completed   HPV VACCINES  Aged Out    Health Maintenance  Health Maintenance Due  Topic Date Due    OPHTHALMOLOGY EXAM  10/16/2020   COLONOSCOPY (Pts 45-64yr Insurance coverage will need to be confirmed)  10/28/2020   FOOT EXAM  11/15/2020    Colorectal cancer screening: Type of screening: Colonoscopy. Completed 10/28/2017. Repeat every 3 years  Lung Cancer Screening: (Low Dose CT Chest recommended if Age 76-80years, 30 pack-year currently smoking OR have quit w/in 15years.) does not qualify.   Lung Cancer Screening Referral: no  Additional Screening:  Hepatitis C Screening: does qualify; due  Vision Screening: Recommended annual ophthalmology exams for early detection of glaucoma and other disorders of the eye. Is the patient up to date with their annual eye exam?  Yes  Who is the provider or what is the name of the office in which the patient attends annual eye exams? Dr. WClydene LamingIf pt is not established with a provider, would they like to be referred to a provider to establish care? No .   Dental Screening: Recommended annual dental exams for proper oral hygiene  Community Resource Referral / Chronic Care Management: CRR required this visit?  No   CCM required this visit?  No      Plan:     I have personally reviewed and noted the following in the patient's chart:   Medical and social history Use of alcohol, tobacco or illicit drugs  Current medications and supplements including opioid prescriptions. Patient is not currently taking opioid prescriptions. Functional ability and status Nutritional status Physical activity Advanced directives List of other physicians Hospitalizations, surgeries, and ER visits in previous 12 months Vitals Screenings to include cognitive, depression, and falls Referrals and appointments  In addition, I have reviewed and discussed with patient certain preventive protocols, quality metrics, and best practice recommendations. A written personalized care plan for preventive services as well as general preventive health recommendations were  provided to patient.     NKellie Simmering LPN   [id number]  Nurse Notes:

## 2021-04-07 NOTE — Patient Instructions (Signed)
Isaac Weber , Thank you for taking time to come for your Medicare Wellness Visit. I appreciate your ongoing commitment to your health goals. Please review the following plan we discussed and let me know if I can assist you in the future.   Screening recommendations/referrals: Colonoscopy: completed 10/28/2017, pt. To follow up when the next one is Recommended yearly ophthalmology/optometry visit for glaucoma screening and checkup Recommended yearly dental visit for hygiene and checkup  Vaccinations: Influenza vaccine: due Pneumococcal vaccine: completed 08/26/2020 Tdap vaccine: completed 07/12/2011, due 07/11/2021 Shingles vaccine: completed   Covid-19:  03/05/2020, 02/13/2020  Advanced directives: Please bring a copy of your POA (Power of Attorney) and/or Living Will to your next appointment.   Conditions/risks identified: none  Next appointment: Follow up in one year for your annual wellness visit.   Preventive Care 76 Years and Older, Male Preventive care refers to lifestyle choices and visits with your health care provider that can promote health and wellness. What does preventive care include? A yearly physical exam. This is also called an annual well check. Dental exams once or twice a year. Routine eye exams. Ask your health care provider how often you should have your eyes checked. Personal lifestyle choices, including: Daily care of your teeth and gums. Regular physical activity. Eating a healthy diet. Avoiding tobacco and drug use. Limiting alcohol use. Practicing safe sex. Taking low doses of aspirin every day. Taking vitamin and mineral supplements as recommended by your health care provider. What happens during an annual well check? The services and screenings done by your health care provider during your annual well check will depend on your age, overall health, lifestyle risk factors, and family history of disease. Counseling  Your health care provider may ask you  questions about your: Alcohol use. Tobacco use. Drug use. Emotional well-being. Home and relationship well-being. Sexual activity. Eating habits. History of falls. Memory and ability to understand (cognition). Work and work Statistician. Screening  You may have the following tests or measurements: Height, weight, and BMI. Blood pressure. Lipid and cholesterol levels. These may be checked every 5 years, or more frequently if you are over 76 years old. Skin check. Lung cancer screening. You may have this screening every year starting at age 76 if you have a 30-pack-year history of smoking and currently smoke or have quit within the past 15 years. Fecal occult blood test (FOBT) of the stool. You may have this test every year starting at age 76. Flexible sigmoidoscopy or colonoscopy. You may have a sigmoidoscopy every 5 years or a colonoscopy every 10 years starting at age 76. Prostate cancer screening. Recommendations will vary depending on your family history and other risks. Hepatitis C blood test. Hepatitis B blood test. Sexually transmitted disease (STD) testing. Diabetes screening. This is done by checking your blood sugar (glucose) after you have not eaten for a while (fasting). You may have this done every 1-3 years. Abdominal aortic aneurysm (AAA) screening. You may need this if you are a current or former smoker. Osteoporosis. You may be screened starting at age 76 if you are at high risk. Talk with your health care provider about your test results, treatment options, and if necessary, the need for more tests. Vaccines  Your health care provider may recommend certain vaccines, such as: Influenza vaccine. This is recommended every year. Tetanus, diphtheria, and acellular pertussis (Tdap, Td) vaccine. You may need a Td booster every 10 years. Zoster vaccine. You may need this after age 76. Pneumococcal 13-valent  conjugate (PCV13) vaccine. One dose is recommended after age  76. Pneumococcal polysaccharide (PPSV23) vaccine. One dose is recommended after age 76. Talk to your health care provider about which screenings and vaccines you need and how often you need them. This information is not intended to replace advice given to you by your health care provider. Make sure you discuss any questions you have with your health care provider. Document Released: 08/08/2015 Document Revised: 03/31/2016 Document Reviewed: 05/13/2015 Elsevier Interactive Patient Education  2017 [employer] Prevention in the Home Falls can cause injuries. They can happen to people of all ages. There are many things you can do to make your home safe and to help prevent falls. What can I do on the outside of my home? Regularly fix the edges of walkways and driveways and fix any cracks. Remove anything that might make you trip as you walk through a door, such as a raised step or threshold. Trim any bushes or trees on the path to your home. Use bright outdoor lighting. Clear any walking paths of anything that might make someone trip, such as rocks or tools. Regularly check to see if handrails are loose or broken. Make sure that both sides of any steps have handrails. Any raised decks and porches should have guardrails on the edges. Have any leaves, snow, or ice cleared regularly. Use sand or salt on walking paths during winter. Clean up any spills in your garage right away. This includes oil or grease spills. What can I do in the bathroom? Use night lights. Install grab bars by the toilet and in the tub and shower. Do not use towel bars as grab bars. Use non-skid mats or decals in the tub or shower. If you need to sit down in the shower, use a plastic, non-slip stool. Keep the floor dry. Clean up any water that spills on the floor as soon as it happens. Remove soap buildup in the tub or shower regularly. Attach bath mats securely with double-sided non-slip rug tape. Do not have throw  rugs and other things on the floor that can make you trip. What can I do in the bedroom? Use night lights. Make sure that you have a light by your bed that is easy to reach. Do not use any sheets or blankets that are too big for your bed. They should not hang down onto the floor. Have a firm chair that has side arms. You can use this for support while you get dressed. Do not have throw rugs and other things on the floor that can make you trip. What can I do in the kitchen? Clean up any spills right away. Avoid walking on wet floors. Keep items that you use a lot in easy-to-reach places. If you need to reach something above you, use a strong step stool that has a grab bar. Keep electrical cords out of the way. Do not use floor polish or wax that makes floors slippery. If you must use wax, use non-skid floor wax. Do not have throw rugs and other things on the floor that can make you trip. What can I do with my stairs? Do not leave any items on the stairs. Make sure that there are handrails on both sides of the stairs and use them. Fix handrails that are broken or loose. Make sure that handrails are as long as the stairways. Check any carpeting to make sure that it is firmly attached to the stairs. Fix any carpet that  is loose or worn. Avoid having throw rugs at the top or bottom of the stairs. If you do have throw rugs, attach them to the floor with carpet tape. Make sure that you have a light switch at the top of the stairs and the bottom of the stairs. If you do not have them, ask someone to add them for you. What else can I do to help prevent falls? Wear shoes that: Do not have high heels. Have rubber bottoms. Are comfortable and fit you well. Are closed at the toe. Do not wear sandals. If you use a stepladder: Make sure that it is fully opened. Do not climb a closed stepladder. Make sure that both sides of the stepladder are locked into place. Ask someone to hold it for you, if  possible. Clearly mark and make sure that you can see: Any grab bars or handrails. First and last steps. Where the edge of each step is. Use tools that help you move around (mobility aids) if they are needed. These include: Canes. Walkers. Scooters. Crutches. Turn on the lights when you go into a dark area. Replace any light bulbs as soon as they burn out. Set up your furniture so you have a clear path. Avoid moving your furniture around. If any of your floors are uneven, fix them. If there are any pets around you, be aware of where they are. Review your medicines with your doctor. Some medicines can make you feel dizzy. This can increase your chance of falling. Ask your doctor what other things that you can do to help prevent falls. This information is not intended to replace advice given to you by your health care provider. Make sure you discuss any questions you have with your health care provider. Document Released: 05/08/2009 Document Revised: 12/18/2015 Document Reviewed: 08/16/2014 Elsevier Interactive Patient Education  2017 [employer].

## 2021-04-13 ENCOUNTER — Other Ambulatory Visit: Payer: Self-pay

## 2021-04-13 ENCOUNTER — Encounter: Payer: Self-pay | Admitting: Gastroenterology

## 2021-04-13 ENCOUNTER — Ambulatory Visit: Payer: PPO | Admitting: Gastroenterology

## 2021-04-13 VITALS — BP 146/72 | HR 106 | Temp 97.6°F | Ht 69.0 in | Wt 172.0 lb

## 2021-04-13 DIAGNOSIS — R053 Chronic cough: Secondary | ICD-10-CM

## 2021-04-13 DIAGNOSIS — Z1211 Encounter for screening for malignant neoplasm of colon: Secondary | ICD-10-CM

## 2021-04-13 DIAGNOSIS — Z86010 Personal history of colonic polyps: Secondary | ICD-10-CM | POA: Diagnosis not present

## 2021-04-13 MED ORDER — CLENPIQ 10-3.5-12 MG-GM -GM/160ML PO SOLN: ORAL | 0 refills | Status: DC

## 2021-04-13 MED ORDER — OMEPRAZOLE 20 MG PO CPDR: 20.0000 mg | DELAYED_RELEASE_CAPSULE | Freq: Every day | ORAL | 1 refills | Status: DC

## 2021-04-13 NOTE — Progress Notes (Signed)
Jonathon Bellows MD, MRCP(U.K) [address]  [place]  [place], [place] [address]  Main: [phone]  Fax: [phone]   Gastroenterology Consultation  Referring Provider:     Brand Males, MD Primary Care Physician:  Olin Hauser, DO Primary Gastroenterologist:  Dr. Jonathon Bellows  Reason for Consultation:     Chronic cough        HPI:   Isaac Weber is a 75 y.o. y/o male referred for chronic cough  The patient was recently seen by Dr. Chase Caller on 03/23/2021 for cough of 3 months duration.  Former Company secretary.  Prior history of asthma.  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  Concern for acid reflux.  Has been referred to ENT as well.CT chest high-resolution in 03/17/2021 showed no clear features of interstitial lung disease.  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  He states that he has been having productive and nonproductive cough on a daily basis since October of last year.  Worse when he lays flat.  Denies any heartburn.  Undergone an extensive work-up.  The question is whether he had acid reflux.  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 Tx: [facility]    Squamous cell carcinoma of skin 02/21/2020   Right neck proximal mandible. WD SCC, ulcerated. [facility] 04/29/2020    Past Surgical History:  Procedure Laterality Date   BUNIONECTOMY Left 2003   hammer toe as well   BUNIONECTOMY WITH HAMMERTOE RECONSTRUCTION Left 2013   repeat   CERVICAL DISC SURGERY     GREEN LIGHT LASER TURP (TRANSURETHRAL RESECTION OF PROSTATE  2010   laser, shrink prostate   KNEE ARTHROSCOPY Right 1991   LEG SURGERY Left    BENIGN BONE TUMOR   LUMBAR [place] SURGERY  2010   discectomy   LUMBAR [place] SURGERY  2011   PROSTATE SURGERY  2002   shrink prostate   ROTATOR CUFF REPAIR Left 1999   debride, remove bonespur   ROTATOR CUFF REPAIR Right 1996    Prior to Admission medications   Medication Sig Start Date End Date Taking? Authorizing Provider  albuterol (VENTOLIN HFA) 108 (90 Base) MCG/ACT inhaler Inhale 1-2 puffs into the lungs every 4 (four) hours as needed for wheezing or shortness of breath (cough). 05/19/20   Karamalegos, Devonne Doughty, DO  amLODipine (NORVASC) 5 MG tablet Take 1 tablet (5 mg total) by mouth daily. 08/20/20   Karamalegos, Devonne Doughty, DO  azelastine (ASTELIN) 0.1 % nasal spray Place 1 spray into both nostrils 2 (two) times daily. Use in each nostril as directed 02/04/21   Martyn Ehrich, NP  Calcium Carb-Cholecalciferol (CALCIUM + D3 PO) Take by mouth. Patient not taking: Reported on 04/07/2021    [provider]  ciprofloxacin (CIPRO) 750 MG tablet Take 1 tablet (750 mg total) by mouth 2 (two) times daily. 04/03/21   Brand Males, MD  diclofenac Sodium (VOLTAREN) 1 % GEL Apply topically 4 (four) times daily. Patient not taking: Reported on 04/07/2021    [provider]  ferrous sulfate 325 (65 FE) MG tablet Take 325 mg by mouth daily with breakfast. Patient not taking: Reported on 04/07/2021    [provider]  fluticasone (FLONASE) 50 MCG/ACT nasal spray USE ONE SPRAY IN [facility] NOSTRIL DAILY 02/02/21   Martyn Ehrich, NP  Fluticasone-Umeclidin-Vilant (TRELEGY ELLIPTA) 200-62.5-25 MCG/INH AEPB Inhale 1 puff into the lungs daily. 02/23/21   Brand Males, MD  Fluticasone-Umeclidin-Vilant (TRELEGY ELLIPTA) 200-62.5-25 MCG/INH AEPB Inhale 1 puff into the lungs daily. 02/23/21   Brand Males, MD  FREESTYLE LITE test strip Use to check blood sugar up to 2 x daily 02/18/21   Olin Hauser, DO  Ginger, Zingiber officinalis, (GINGER PO) Take by mouth.    [provider]  Glucosamine-Chondroitin (GLUCOSAMINE CHONDR COMPLEX PO) Take by mouth 2 (two) times daily.  Patient not taking: Reported on 04/07/2021    [provider]  hydrochlorothiazide (HYDRODIURIL) 12.5 MG tablet Take 1 tablet (12.5 mg total) by mouth daily. 08/20/20   Karamalegos, Devonne Doughty, DO  ibuprofen (ADVIL,MOTRIN) 200 MG tablet Take 200-800 mg by mouth every 6 (six) hours as needed for fever, headache, mild pain, moderate pain or cramping.     [provider]  ketoconazole (NIZORAL) 2 % shampoo SHAMPOO INTO SCALP, LET SIT FOR 10 MINUTES THEN WASH OFF. USE 3 TIMES PER WEEK 03/09/21   Ralene Bathe, MD  loratadine (CLARITIN) 10 MG tablet Take 10 mg by mouth daily.    [provider]  losartan (COZAAR) 50 MG tablet Take 1 tablet (50 mg total) by mouth daily. 08/20/20   Karamalegos, Devonne Doughty, DO  metFORMIN (GLUCOPHAGE-XR) 500 MG 24 hr tablet Take 1 tablet (500 mg total) by mouth at bedtime. 08/20/20   Olin Hauser, DO  Multiple Vitamin (MULTIVITAMIN WITH MINERALS) TABS tablet Take 1 tablet by mouth daily. Patient not taking: Reported on 04/07/2021    [provider]  omeprazole (PRILOSEC) 20 MG capsule TAKE ONE CAPSULE BY MOUTH ONCE DAILY Patient not taking: Reported on 04/07/2021 03/24/21   Brand Males, MD  rosuvastatin (CRESTOR) 5 MG tablet Take 1 tablet (5 mg total) by mouth daily. 08/20/20   Karamalegos, Devonne Doughty, DO  tamsulosin (FLOMAX) 0.4 MG CAPS capsule Take 1 capsule  (0.4 mg total) by mouth daily. 10/01/20   [name], MD  TRELEGY ELLIPTA 200-62.5-25 MCG/INH AEPB INHALE 1 PUFF BY MOUTH INTO LUNGS DAILY 03/23/21   Brand Males, MD    Family History  Problem Relation Age of Onset   Heart disease Mother 64   Emphysema Father 75   Heart disease Brother 15     Social History   Tobacco Use   Smoking status: Former    Packs/day: 3.00    Years: 26.00    Pack years: 78.00    Types: Cigarettes    Quit date: 09/17/1971    Years since quitting: 49.6   Smokeless tobacco: Never  Vaping Use   Vaping Use: Never used  Substance Use Topics   Alcohol use: Yes    Alcohol/week: 2.0 standard drinks    Types:  2 Standard drinks or equivalent per week   Drug use: No    Allergies as of 04/13/2021 - Review Complete 03/23/2021  Allergen Reaction Noted   Ciprofloxacin Diarrhea and Nausea And Vomiting 04/05/2014   Duloxetine Other (See Comments) 02/20/2019   Morphine and related Diarrhea and Nausea And Vomiting 04/05/2014    Review of Systems:    All systems reviewed and negative except where noted in HPI.   Physical Exam:  BP (!) 146/72   Pulse (!) 106   Temp 97.6 F (36.4 C) (Oral)   Ht 5\' 9"  (1.753 m)   Wt 172 lb (78 kg)   BMI 25.40 kg/m  No LMP for male patient. Psych:  Alert and cooperative. Normal mood and affect. General:   Alert,  Well-developed, well-nourished, pleasant and cooperative in NAD Head:  Normocephalic and atraumatic. Eyes:  Sclera clear, no icterus.   Conjunctiva pink. Ears:  Normal auditory acuity. Lungs:  Respirations even and unlabored.  Clear throughout to auscultation.   No wheezes, crackles, or rhonchi. No acute distress. Heart:  Regular rate and rhythm; no murmurs, clicks, rubs, or gallops. Abdomen:  Normal bowel sounds.  No bruits.  Soft, non-tender and non-distended without masses, hepatosplenomegaly or hernias noted.  No guarding or rebound tenderness.    Neurologic:  Alert and oriented x3;  grossly normal  neurologically. Psych:  Alert and cooperative. Normal mood and affect.  Imaging Studies: CT Chest High Resolution  Result Date: 03/17/2021 CLINICAL DATA:  75 year old male with history of chronic cough since October 2022. EXAM: CT CHEST WITHOUT CONTRAST TECHNIQUE: Multidetector CT imaging of the chest was performed following the standard protocol without intravenous contrast. High resolution imaging of the lungs, as well as inspiratory and expiratory imaging, was performed. COMPARISON:  Chest CT 07/24/2020. FINDINGS: Cardiovascular: Heart size is normal. There is no significant pericardial fluid, thickening or pericardial calcification. There is aortic atherosclerosis, as well as atherosclerosis of the great vessels of the mediastinum and the coronary arteries, including calcified atherosclerotic plaque in the left main, left anterior descending, left circumflex and right coronary arteries. Calcifications of the mitral annulus. Mediastinum/Nodes: No pathologically enlarged mediastinal or hilar lymph nodes. Please note that accurate exclusion of hilar adenopathy is limited on noncontrast CT scans. Esophagus is unremarkable in appearance. No axillary lymphadenopathy. Lungs/Pleura: High-resolution images demonstrates some very mild areas of ground-glass attenuation micro nodularity which are most evident in a peribronchovascular distribution in the extreme lung bases. Scattered micro nodules measuring 2-3 mm in size are also noted in the periphery of the lungs bilaterally, nonspecific but statistically likely to represent areas of mucoid impaction within terminal bronchioles. No larger more suspicious appearing pulmonary nodules or masses are noted. Small calcified granuloma in the posterior aspect of the right upper lobe. No generalized regions of ground-glass attenuation or significant regions of septal thickening, subpleural reticulation, parenchymal banding, traction bronchiectasis or honeycombing are noted.  No acute consolidative airspace disease. No pleural effusions. Mild diffuse bronchial wall thickening with mild centrilobular and mild paraseptal emphysema. Upper Abdomen: Subsegment low-attenuation lesion in segment 2 of the liver, incompletely characterized on today's non-contrast CT examination, but similar to the prior study and statistically likely to represent a small cyst. Aortic atherosclerosis. Musculoskeletal: There are no aggressive appearing lytic or blastic lesions noted in the visualized portions of the skeleton. IMPRESSION: 1. No definitive imaging findings to suggest interstitial lung disease. There are subtle changes in the lung bases which are nonspecific, but could suggest sequela of mild recurrent aspiration. If  there is persistent clinical concern for interstitial lung disease, repeat high-resolution chest CT could be considered in 12 months to assess for temporal changes in the appearance of the lung parenchyma. 2. Mild diffuse bronchial wall thickening with mild centrilobular and paraseptal emphysema; imaging findings suggestive of underlying COPD. 3. Multiple tiny 2-3 mm pulmonary nodules scattered throughout the periphery of the lungs bilaterally, nonspecific, but statistically likely to represent benign areas of mucoid impaction within terminal bronchioles. No follow-up needed if patient is low-risk (and has no known or suspected primary neoplasm). Non-contrast chest CT can be considered in 12 months if patient is high-risk. This recommendation follows the consensus statement: Guidelines for Management of Incidental Pulmonary Nodules Detected on CT Images: From the Fleischner Society 2017; Radiology 2017; 284:228-243. 4. Aortic atherosclerosis, in addition to left main and 3 vessel coronary artery disease. Assessment for potential risk factor modification, dietary therapy or pharmacologic therapy may be warranted, if clinically indicated. 5. There are calcifications of the mitral annulus.  Echocardiographic correlation for evaluation of potential valvular dysfunction may be warranted if clinically indicated. Aortic Atherosclerosis (ICD10-I70.0) and Emphysema (ICD10-J43.9). Electronically Signed   By: Vinnie Langton M.D.   On: 03/17/2021 13:02   CT Maxillofacial LTD WO CM  Result Date: 03/17/2021 CLINICAL DATA:  Chronic cough EXAM: CT PARANASAL SINUS LIMITED WITHOUT CONTRAST TECHNIQUE: Multidetector CT images of the paranasal sinuses were obtained using the standard protocol without intravenous contrast. COMPARISON:  CT head 03/09/2018 FINDINGS: Paranasal sinuses: Frontal: Mild mucosal thickening in the right frontal sinus. The right frontoethmoidal recess is occluded. The left frontal sinus as well degraded. The left frontoethmoidal recess is patent. Ethmoid: Partial opacification of the right-greater-than-left ethmoid air cells. Maxillary: Minimal mucosal thickening inferiorly. Sphenoid: Normally aerated. Patent sphenoethmoidal recesses. Right ostiomeatal unit: Patent. Left ostiomeatal unit: Mucosal thickening occludes the left ostiomeatal unit. Nasal passages: Patent. Intact nasal septum is mostly midline, with left nasal spur and posterior leftward deviation. Anatomy: No pneumatization superior to anterior ethmoid notches. Symmetric and intact olfactory grooves and fovea ethmoidalis, Keros II (4-15mm). Sellar sphenoid pneumatization pattern. Other: Orbits and intracranial compartment are unremarkable. Visible mastoid air cells are normally aerated. IMPRESSION: Mucosal thickening in the right frontal sinus, ethmoid air cells, and inferior maxillary sinuses. The right frontoethmoidal recess and left ostiomeatal unit are occluded. Electronically Signed   By: Merilyn Baba M.D.   On: 03/17/2021 15:31    Assessment and Plan:   Isaac Weber is a 75 y.o. y/o male has been referred to see me for chronic cough which has been productive at times and nonproductive at times.  No clear history to  suggest acid reflux although symptoms are very atypical.  I explained to him often is hard to differentiate acid reflex secondary to cough or cough secondary to acid reflux.  The easiest way would be to give him a trial of PPI at high dose for 6 to 8 weeks to see response to therapy.  He is also due for a surveillance colonoscopy due to multiple polyps found 5 years back.  Over 10 mm size polyps were taken out.  He is otherwise healthy and fit and should be able to undergo a colonoscopy   Plan 1.  Repeat MRI of the abdomen to follow-up pancreatic cystic lesions in May 2023 2.  Trial of Prilosec 40 mg twice daily.  If he is no better at the end of this I have suggested to consider holding his losartan as a trial which although may not cause  cough often, there have been mention of it in literature.  If no better at next time would recommend to see ENT as well.?  MRI of brain if cough persists.   I have discussed alternative options, risks & benefits,  which include, but are not limited to, bleeding, infection, perforation,respiratory complication & drug reaction.  The patient agrees with this plan & written consent will be obtained.     Follow up in 4 to 6-week  Dr Jonathon Bellows MD,MRCP(U.K)

## 2021-04-15 MED ORDER — OMEPRAZOLE 40 MG PO CPDR: 40.0000 mg | DELAYED_RELEASE_CAPSULE | Freq: Two times a day (BID) | ORAL | 0 refills | Status: DC

## 2021-04-20 ENCOUNTER — Encounter: Payer: Self-pay | Admitting: Family Medicine

## 2021-04-21 ENCOUNTER — Ambulatory Visit (INDEPENDENT_AMBULATORY_CARE_PROVIDER_SITE_OTHER): Payer: PPO | Admitting: Family Medicine

## 2021-04-21 VITALS — Wt 172.0 lb

## 2021-04-21 DIAGNOSIS — Z23 Encounter for immunization: Secondary | ICD-10-CM

## 2021-04-22 DIAGNOSIS — R053 Chronic cough: Secondary | ICD-10-CM

## 2021-04-22 NOTE — Telephone Encounter (Signed)
Dr. Ramaswamy, Please see patient message and advise.  Thank you. 

## 2021-04-24 NOTE — Telephone Encounter (Signed)
Spoke to patient and called him 5:11 PM 04/24/2021 . H elives [address] [place] [facility] [facility] [phone]    Finished cipro - while he was on it helped while on it and few days after but now sputum back In color.  He is sking about repeat sputum - can go to [facility] and give sputum culture - regular bacteria gram stain and culture and AFB smear and culture

## 2021-04-24 NOTE — Telephone Encounter (Signed)
MR, can you please advise? Thanks.  

## 2021-04-29 ENCOUNTER — Other Ambulatory Visit
Admission: RE | Admit: 2021-04-29 | Discharge: 2021-04-29 | Disposition: A | Discharge status: Home / Self Care | Payer: PPO | Source: Ambulatory Visit | Attending: Internal Medicine | Admitting: Internal Medicine

## 2021-04-29 DIAGNOSIS — R053 Chronic cough: Secondary | ICD-10-CM | POA: Insufficient documentation

## 2021-04-29 LAB — EXPECTORATED SPUTUM ASSESSMENT W GRAM STAIN, RFLX TO RESP C: Special Requests: NORMAL

## 2021-04-30 ENCOUNTER — Ambulatory Visit
Admission: RE | Admit: 2021-04-30 | Discharge: 2021-04-30 | Disposition: A | Discharge status: Home / Self Care | Payer: PPO | Attending: Gastroenterology | Admitting: Gastroenterology

## 2021-04-30 ENCOUNTER — Encounter
Admission: RE | Disposition: A | Discharge status: Home / Self Care | Payer: Self-pay | Source: Home / Self Care | Attending: Gastroenterology

## 2021-04-30 ENCOUNTER — Encounter: Payer: Self-pay | Admitting: Gastroenterology

## 2021-04-30 ENCOUNTER — Ambulatory Visit: Payer: PPO | Admitting: Anesthesiology

## 2021-04-30 DIAGNOSIS — D125 Benign neoplasm of sigmoid colon: Secondary | ICD-10-CM | POA: Diagnosis not present

## 2021-04-30 DIAGNOSIS — Z86010 Personal history of colonic polyps: Secondary | ICD-10-CM | POA: Insufficient documentation

## 2021-04-30 DIAGNOSIS — K640 First degree hemorrhoids: Secondary | ICD-10-CM | POA: Diagnosis not present

## 2021-04-30 DIAGNOSIS — D126 Benign neoplasm of colon, unspecified: Secondary | ICD-10-CM

## 2021-04-30 DIAGNOSIS — D120 Benign neoplasm of cecum: Secondary | ICD-10-CM | POA: Diagnosis not present

## 2021-04-30 DIAGNOSIS — K635 Polyp of colon: Secondary | ICD-10-CM | POA: Insufficient documentation

## 2021-04-30 DIAGNOSIS — Z1211 Encounter for screening for malignant neoplasm of colon: Secondary | ICD-10-CM | POA: Diagnosis not present

## 2021-04-30 DIAGNOSIS — D122 Benign neoplasm of ascending colon: Secondary | ICD-10-CM | POA: Diagnosis not present

## 2021-04-30 HISTORY — PX: COLONOSCOPY WITH PROPOFOL: SHX5780

## 2021-04-30 LAB — ACID FAST SMEAR (AFB, MYCOBACTERIA): Acid Fast Smear: NEGATIVE

## 2021-04-30 LAB — GLUCOSE, CAPILLARY: Glucose-Capillary: 126 mg/dL — ABNORMAL HIGH (ref 70–99)

## 2021-04-30 SURGERY — COLONOSCOPY WITH PROPOFOL
Anesthesia: General

## 2021-04-30 MED ORDER — PROPOFOL 10 MG/ML IV BOLUS
INTRAVENOUS | Status: AC
  Filled 2021-04-30: qty 20

## 2021-04-30 MED ORDER — PROPOFOL 10 MG/ML IV BOLUS
INTRAVENOUS | Status: DC | PRN
  Administered 2021-04-30: 40 mg via INTRAVENOUS

## 2021-04-30 MED ORDER — SODIUM CHLORIDE 0.9 % IV SOLN
INTRAVENOUS | Status: DC
  Administered 2021-04-30: 1000 mL via INTRAVENOUS

## 2021-04-30 MED ORDER — LIDOCAINE HCL (CARDIAC) PF 100 MG/5ML IV SOSY
PREFILLED_SYRINGE | INTRAVENOUS | Status: DC | PRN
  Administered 2021-04-30: 50 mg via INTRAVENOUS

## 2021-04-30 MED ORDER — PHENYLEPHRINE HCL (PRESSORS) 10 MG/ML IV SOLN
INTRAVENOUS | Status: AC
  Filled 2021-04-30: qty 1

## 2021-04-30 MED ORDER — PROPOFOL 500 MG/50ML IV EMUL
INTRAVENOUS | Status: DC | PRN
  Administered 2021-04-30: 125 ug/kg/min via INTRAVENOUS

## 2021-04-30 MED ORDER — PHENYLEPHRINE HCL (PRESSORS) 10 MG/ML IV SOLN
INTRAVENOUS | Status: DC | PRN
  Administered 2021-04-30: 50 ug via INTRAVENOUS

## 2021-04-30 MED ORDER — PROPOFOL 500 MG/50ML IV EMUL
INTRAVENOUS | Status: AC
  Filled 2021-04-30: qty 50

## 2021-04-30 NOTE — Transfer of Care (Signed)
Immediate Anesthesia Transfer of Care Note  Patient: Isaac Weber  Procedure(s) Performed: COLONOSCOPY WITH PROPOFOL  Patient Location: PACU  Anesthesia Type:General  Level of Consciousness: awake, alert  and oriented  Airway & Oxygen Therapy: Patient Spontanous Breathing and Patient connected to nasal cannula oxygen  Post-op Assessment: Report given to RN and Post -op Vital signs reviewed and stable  Post vital signs: Reviewed and stable  Last Vitals:  Vitals Value Taken Time  BP    Temp    Pulse    Resp    SpO2      Last Pain:  Vitals:   04/30/21 0707  TempSrc: Temporal  PainSc: 0-No pain         Complications: No notable events documented.

## 2021-04-30 NOTE — Anesthesia Postprocedure Evaluation (Signed)
Anesthesia Post Note  Patient: Isaac Weber  Procedure(s) Performed: COLONOSCOPY WITH PROPOFOL  Patient location during evaluation: Endoscopy Anesthesia Type: General Level of consciousness: awake and alert Pain management: pain level controlled Vital Signs Assessment: post-procedure vital signs reviewed and stable Respiratory status: spontaneous breathing, nonlabored ventilation and respiratory function stable Cardiovascular status: blood pressure returned to baseline and stable Postop Assessment: no apparent nausea or vomiting Anesthetic complications: no   No notable events documented.   Last Vitals:  Vitals:   04/30/21 0820 04/30/21 0830  BP: 113/76 121/64  Pulse: 86 72  Resp: 17 12  Temp:    SpO2: 99% 96%    Last Pain:  Vitals:   04/30/21 0810  TempSrc: Temporal  PainSc:                  [place] Ouch

## 2021-04-30 NOTE — Anesthesia Preprocedure Evaluation (Addendum)
Anesthesia Evaluation  Patient identified by MRN, date of birth, ID band Patient awake    Reviewed: Allergy & Precautions, NPO status , Patient's Chart, lab work & pertinent test results, reviewed documented beta blocker date and time   Airway Mallampati: II  TM Distance: >3 FB Neck ROM: Full    Dental  (+) Upper Dentures   Pulmonary COPD, former smoker,  Chronic cough   Pulmonary exam normal        Cardiovascular Exercise Tolerance: Good hypertension, Pt. on medications + CAD  Normal cardiovascular exam     Neuro/Psych negative neurological ROS  negative psych ROS   GI/Hepatic Neg liver ROS, GERD  Controlled and Medicated,  Endo/Other  diabetes, Well Controlled, Type 2, Oral Hypoglycemic Agents  Renal/GU negative Renal ROS  negative genitourinary   Musculoskeletal  (+) Arthritis ,   Abdominal Normal abdominal exam  (+)   Peds  Hematology negative hematology ROS (+)   Anesthesia Other Findings   Reproductive/Obstetrics                           Anesthesia Physical Anesthesia Plan  ASA: 3  Anesthesia Plan: General   Post-op Pain Management:    Induction: Intravenous  PONV Risk Score and Plan:   Airway Management Planned: Natural Airway and Nasal Cannula  Additional Equipment:   Intra-op Plan:   Post-operative Plan:   Informed Consent: I have reviewed the patients History and Physical, chart, labs and discussed the procedure including the risks, benefits and alternatives for the proposed anesthesia with the patient or authorized representative who has indicated his/her understanding and acceptance.     Dental advisory given  Plan Discussed with: Anesthesiologist, CRNA and Surgeon  Anesthesia Plan Comments: (Patient consented for risks of anesthesia including but not limited to:  - adverse reactions to medications - risk of airway placement if required - damage to eyes,  teeth, lips or other oral mucosa - nerve damage due to positioning  - sore throat or hoarseness - Damage to heart, brain, nerves, lungs, other parts of body or loss of life  Patient voiced understanding.)       Anesthesia Quick Evaluation

## 2021-04-30 NOTE — Op Note (Signed)
[facility] Patient Name: Isaac Weber Procedure Date: 04/30/2021 7:07 AM MRN: [id number] Account #: [id number] Date of Birth: October 15, 1945 Admit Type: Outpatient Age: 75 Room: [facility] ENDO ROOM 3 Gender: Male Note Status: Finalized Instrument Name: Jasper Riling [id number] Procedure:             Colonoscopy Indications:           Surveillance: Personal history of adenomatous polyps                         on last colonoscopy > 3 years ago Providers:             Jonathon Bellows MD, MD Referring MD:          Olin Hauser (Referring MD) Medicines:             Monitored Anesthesia Care Complications:         No immediate complications. Procedure:             Pre-Anesthesia Assessment:                        - Prior to the procedure, a History and Physical was                         performed, and patient medications, allergies and                         sensitivities were reviewed. The patient's tolerance                         of previous anesthesia was reviewed.                        - The risks and benefits of the procedure and the                         sedation options and risks were discussed with the                         patient. All questions were answered and informed                         consent was obtained.                        - ASA Grade Assessment: II - A patient with mild                         systemic disease.                        After obtaining informed consent, the colonoscope was                         passed under direct vision. Throughout the procedure,                         the patient's blood pressure, pulse, and oxygen  saturations were monitored continuously. The                         Colonoscope was introduced through the anus and                         advanced to the the cecum, identified by the                         appendiceal orifice. The colonoscopy was performed                          with ease. The patient tolerated the procedure well.                         The quality of the bowel preparation was good. Findings:      The perianal and digital rectal examinations were normal.      A 5 mm polyp was found in the transverse colon. The polyp was sessile.       The polyp was removed with a cold snare. Resection was complete, but the       polyp tissue was not retrieved.      A 5 mm polyp was found in the cecum. The polyp was sessile. The polyp       was removed with a cold snare. Resection and retrieval were complete.      A 3 mm polyp was found in the sigmoid colon. The polyp was sessile. The       polyp was removed with a cold biopsy forceps. Resection and retrieval       were complete.      Four sessile polyps were found in the ascending colon. The polyps were 5       to 8 mm in size. These polyps were removed with a cold snare. Resection       was complete, but the polyp tissue was only partially retrieved.      Non-bleeding internal hemorrhoids were found during retroflexion. The       hemorrhoids were large and Grade I (internal hemorrhoids that do not       prolapse).      The exam was otherwise without abnormality on direct and retroflexion       views. Impression:            - One 5 mm polyp in the transverse colon, removed with                         a cold snare. Complete resection. Polyp tissue not                         retrieved.                        - One 5 mm polyp in the cecum, removed with a cold                         snare. Resected and retrieved.                        - One 3 mm polyp in the sigmoid colon, removed with a  cold biopsy forceps. Resected and retrieved.                        - Four 5 to 8 mm polyps in the ascending colon,                         removed with a cold snare. Complete resection. Partial                         retrieval.                        - Non-bleeding internal  hemorrhoids.                        - The examination was otherwise normal on direct and                         retroflexion views. Recommendation:        - Discharge patient to home (with escort).                        - Resume previous diet.                        - Continue present medications.                        - Await pathology results.                        - Repeat colonoscopy is not recommended due to current                         age (53 years or older) for surveillance. Procedure Code(s):     --- Professional ---                        [id number], Colonoscopy, flexible; with removal of                         tumor(s), polyp(s), or other lesion(s) by snare                         technique                        45380, 44, Colonoscopy, flexible; with biopsy, single                         or multiple Diagnosis Code(s):     --- Professional ---                        K63.5, Polyp of colon                        Z86.010, Personal history of colonic polyps                        K64.0, First degree hemorrhoids CPT copyright 2019 American Medical Association. All rights reserved. The codes documented in this report are  preliminary and upon coder review may  be revised to meet current compliance requirements. Jonathon Bellows, MD Jonathon Bellows MD, MD 04/30/2021 8:09:29 AM This report has been signed electronically. Number of Addenda: 0 Note Initiated On: 04/30/2021 7:07 AM Scope Withdrawal Time: 0 hours 13 minutes 18 seconds  Total Procedure Duration: 0 hours 17 minutes 10 seconds  Estimated Blood Loss:  Estimated blood loss: none.      [facility]

## 2021-04-30 NOTE — H&P (Signed)
Jonathon Bellows, MD [address] [place], [place], [place], [address] [address], [place], [place], [place], [address] Phone: [phone]  Fax: [phone]  Primary Care Physician:  Olin Hauser, DO   Pre-Procedure History & Physical: HPI:  Isaac Weber is a 75 y.o. male is here for an colonoscopy.   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 WD SCC, ulcerated. [facility] 04/29/2020    Past Surgical History:  Procedure Laterality Date   BACK SURGERY     BUNIONECTOMY Left 2003   hammer toe as well   BUNIONECTOMY WITH HAMMERTOE RECONSTRUCTION Left 2013   repeat   CERVICAL DISC SURGERY     GREEN LIGHT LASER TURP (TRANSURETHRAL RESECTION OF PROSTATE  2010   laser, shrink prostate   KNEE ARTHROSCOPY Right 1991   LEG SURGERY Left    BENIGN BONE TUMOR   LUMBAR [place] SURGERY  2010   discectomy   LUMBAR [place] SURGERY  2011   PROSTATE SURGERY  2002   shrink prostate   ROTATOR CUFF REPAIR Left 1999   debride, remove bonespur   ROTATOR CUFF REPAIR Right 1996    Prior to Admission medications   Medication Sig Start Date End Date Taking? Authorizing Provider  amLODipine (NORVASC) 5 MG tablet Take 1 tablet (5 mg total) by mouth daily. 08/20/20  Yes Karamalegos, Devonne Doughty, DO  azelastine  (ASTELIN) 0.1 % nasal spray Place 1 spray into both nostrils 2 (two) times daily. Use in each nostril as directed 02/04/21  Yes Martyn Ehrich, NP  Calcium Carb-Cholecalciferol (CALCIUM + D3 PO) Take by mouth.   Yes [provider]  diclofenac Sodium (VOLTAREN) 1 % GEL Apply topically 4 (four) times daily.   Yes [provider]  ferrous sulfate 325 (65 FE) MG tablet Take 325 mg by mouth daily with breakfast.   Yes [provider]  fluticasone (FLONASE) 50 MCG/ACT nasal spray USE ONE SPRAY IN EACH NOSTRIL DAILY 02/02/21  Yes Martyn Ehrich, NP  Fluticasone-Umeclidin-Vilant (TRELEGY ELLIPTA) 200-62.5-25 MCG/INH AEPB Inhale 1 puff into the lungs daily. 02/23/21  Yes Brand Males, MD  FREESTYLE LITE test strip Use to check blood sugar up to 2 x daily 02/18/21  Yes Karamalegos, Devonne Doughty, DO  Ginger, Zingiber officinalis, (GINGER PO) Take by mouth. Tea   Yes [provider]  hydrochlorothiazide (HYDRODIURIL) 12.5 MG tablet Take 1 tablet (12.5 mg total) by mouth daily. 08/20/20  Yes Karamalegos, Devonne Doughty, DO  losartan (COZAAR) 50 MG tablet Take 1 tablet (50 mg total) by mouth daily. 08/20/20  Yes Karamalegos, Devonne Doughty, DO  metFORMIN (GLUCOPHAGE-XR) 500 MG 24 hr tablet Take 1 tablet (500 mg total) by mouth at bedtime. 08/20/20  Yes Karamalegos, Devonne Doughty, DO  Multiple Vitamin (MULTIVITAMIN WITH MINERALS) TABS tablet Take 1 tablet by mouth daily.   Yes [provider]  omeprazole (PRILOSEC) 40 MG capsule Take 1 capsule (40 mg total) by mouth in the morning and at bedtime. 04/15/21  Yes Jonathon Bellows, MD  rosuvastatin (CRESTOR) 5 MG tablet Take 1 tablet (5 mg total) by mouth daily. 08/20/20  Yes Karamalegos, Devonne Doughty, DO  Sod Picosulfate-Mag Ox-Cit Acd (CLENPIQ) 10-3.5-12 MG-GM -GM/160ML SOLN Take 1 bottle at 5 PM followed by five 8 oz cups of water and repeat 5 hours before procedure. 04/13/21  Yes Jonathon Bellows, MD  tamsulosin (FLOMAX) 0.4 MG CAPS  capsule Take 1 capsule (0.4 mg total) by mouth daily. 10/01/20  Yes [name], MD  albuterol (VENTOLIN HFA) 108 (90 Base) MCG/ACT inhaler Inhale 1-2 puffs into the lungs every 4 (four) hours as needed for wheezing or shortness of breath (cough). 05/19/20   Karamalegos, Devonne Doughty, DO  ciprofloxacin (CIPRO) 750 MG tablet Take 1 tablet (750 mg total) by mouth 2 (two) times daily. Patient not taking: Reported on 04/30/2021 04/03/21   Brand Males, MD  Fluticasone-Umeclidin-Vilant (TRELEGY ELLIPTA) 200-62.5-25 MCG/INH AEPB Inhale 1 puff into the lungs daily. 02/23/21   Brand Males, MD  Glucosamine-Chondroitin (GLUCOSAMINE CHONDR COMPLEX PO) Take by mouth 2 (two) times daily.    [provider]  ibuprofen (ADVIL,MOTRIN) 200 MG tablet Take 200-800 mg by mouth every 6 (six) hours as needed for fever, headache, mild pain, moderate pain or cramping.     [provider]  ketoconazole (NIZORAL) 2 % shampoo SHAMPOO INTO SCALP, LET SIT FOR 10 MINUTES THEN WASH OFF. USE 3 TIMES PER WEEK 03/09/21   Ralene Bathe, MD  loratadine (CLARITIN) 10 MG tablet Take 10 mg by mouth daily.    [provider]  [name] 200-62.5-25 MCG/INH AEPB INHALE 1 PUFF BY MOUTH INTO LUNGS DAILY 03/23/21   Brand Males, MD    Allergies as of 04/13/2021 - Review Complete 03/23/2021  Allergen Reaction Noted   Ciprofloxacin Diarrhea and Nausea And Vomiting 04/05/2014   Duloxetine Other (See Comments) 02/20/2019   Morphine and related Diarrhea and Nausea And Vomiting 04/05/2014    Family History  Problem Relation Age of Onset   Heart disease Mother 53   Emphysema Father 13   Heart disease Brother 54    Social History   Socioeconomic History   Marital status: Married    Spouse name: Not on file   Number of children: Not on file   Years of education: high school   Highest education level: High school graduate  Occupational History   Occupation: retired  Tobacco Use    Smoking status: Former    Packs/day: 3.00    Years: 26.00    Pack years: 78.00    Types: Cigarettes    Quit date: 09/17/1971    Years since quitting: 49.6   Smokeless tobacco: Never  Vaping Use   Vaping Use: Never used  Substance and Sexual Activity   Alcohol use: Yes    Alcohol/week: 2.0 standard drinks    Types: 2 Standard drinks or equivalent per week   Drug use: No   Sexual activity: Not on file  Other Topics Concern   Not on file  Social History Narrative   Not on file   Social Determinants of Health   Financial Resource Strain: Low Risk    Difficulty of Paying Living Expenses: Not hard at all  Food Insecurity: No Food Insecurity   Worried About Charity fundraiser in the Last Year: Never true   Ran Out of Food in the Last Year: Never true  Transportation Needs: No Transportation Needs   Lack of Transportation (Medical): No   Lack of Transportation (Non-Medical): No  Physical Activity: Insufficiently Active   Days of Exercise per Week: 7 days   Minutes of Exercise per Session: 20 min  Stress: No Stress Concern Present   Feeling of Stress : Not at all  Social Connections: Not on file  Intimate Partner Violence: Not on file    Review of Systems: See HPI, otherwise negative ROS  Physical Exam: BP 134/88   Pulse (!) 116   Temp (!) 97.1 F (36.2 C) (Temporal)   Resp 18   Ht 5\' 9"  (1.753 m)   Wt 74.5 kg   SpO2 99%   BMI 24.27 kg/m  General:   Alert,  pleasant and cooperative in NAD Head:  Normocephalic and atraumatic. Neck:  Supple; no masses or thyromegaly. Lungs:  Clear throughout to auscultation, normal respiratory effort.    Heart:  +S1, +S2, Regular rate and rhythm, No edema. Abdomen:  Soft, nontender and nondistended. Normal bowel sounds, without guarding, and without rebound.   Neurologic:  Alert and  oriented x4;  grossly normal neurologically.  Impression/Plan: MARINO ROGERSON is here for an colonoscopy to be performed for surveillance due to  prior history of colon polyps   Risks, benefits, limitations, and alternatives regarding  colonoscopy have been reviewed with the patient.  Questions have been answered.  All parties agreeable.   Jonathon Bellows, MD  04/30/2021, 7:44 AM

## 2021-05-01 ENCOUNTER — Encounter: Payer: Self-pay | Admitting: Gastroenterology

## 2021-05-01 LAB — CULTURE, RESPIRATORY W GRAM STAIN
Culture: NORMAL
Special Requests: NORMAL

## 2021-05-01 LAB — SURGICAL PATHOLOGY

## 2021-05-04 ENCOUNTER — Encounter: Payer: Self-pay | Admitting: Gastroenterology

## 2021-05-12 NOTE — Telephone Encounter (Signed)
PCCs could you please advise on the referral placed in last OV for ENT?   On August 29th when I was in several procedures were discussed about my chronic cough. I did see the GI doctor and Dr. Vicente Males put me on 80mg  per day of omeprozole which  at this point doesn't seem to be helping.  My next visit at your office is on Nov. 4th. My cough isn't any better nor the spitting up.  At the last visit we discussed seeing Dr. Lind Guest  the ENT doctor in [place] which from my understanding was that a referral was going to be set up for that. As of now I have not heard anything from the ENT's  office about that, so I am wondering if that is still suppose to happen prior to my visit? Thank you, [name]  Thank you

## 2021-05-13 ENCOUNTER — Encounter: Payer: Self-pay | Admitting: Family Medicine

## 2021-05-13 DIAGNOSIS — M7540 Impingement syndrome of unspecified shoulder: Secondary | ICD-10-CM

## 2021-05-13 DIAGNOSIS — M9905 Segmental and somatic dysfunction of pelvic region: Secondary | ICD-10-CM | POA: Diagnosis not present

## 2021-05-13 DIAGNOSIS — M9903 Segmental and somatic dysfunction of lumbar region: Secondary | ICD-10-CM | POA: Diagnosis not present

## 2021-05-13 DIAGNOSIS — M25512 Pain in left shoulder: Secondary | ICD-10-CM

## 2021-05-13 DIAGNOSIS — M5136 Other intervertebral disc degeneration, lumbar region: Secondary | ICD-10-CM | POA: Diagnosis not present

## 2021-05-13 DIAGNOSIS — M19012 Primary osteoarthritis, left shoulder: Secondary | ICD-10-CM

## 2021-05-13 DIAGNOSIS — G8929 Other chronic pain: Secondary | ICD-10-CM

## 2021-05-13 DIAGNOSIS — M5432 Sciatica, left side: Secondary | ICD-10-CM | POA: Diagnosis not present

## 2021-05-18 DIAGNOSIS — M5432 Sciatica, left side: Secondary | ICD-10-CM | POA: Diagnosis not present

## 2021-05-18 DIAGNOSIS — M5136 Other intervertebral disc degeneration, lumbar region: Secondary | ICD-10-CM | POA: Diagnosis not present

## 2021-05-18 DIAGNOSIS — M9905 Segmental and somatic dysfunction of pelvic region: Secondary | ICD-10-CM | POA: Diagnosis not present

## 2021-05-18 DIAGNOSIS — M9903 Segmental and somatic dysfunction of lumbar region: Secondary | ICD-10-CM | POA: Diagnosis not present

## 2021-05-20 DIAGNOSIS — M5136 Other intervertebral disc degeneration, lumbar region: Secondary | ICD-10-CM | POA: Diagnosis not present

## 2021-05-20 DIAGNOSIS — M9903 Segmental and somatic dysfunction of lumbar region: Secondary | ICD-10-CM | POA: Diagnosis not present

## 2021-05-20 DIAGNOSIS — M9905 Segmental and somatic dysfunction of pelvic region: Secondary | ICD-10-CM | POA: Diagnosis not present

## 2021-05-20 DIAGNOSIS — M5432 Sciatica, left side: Secondary | ICD-10-CM | POA: Diagnosis not present

## 2021-05-21 DIAGNOSIS — R918 Other nonspecific abnormal finding of lung field: Secondary | ICD-10-CM | POA: Diagnosis not present

## 2021-05-21 DIAGNOSIS — Z87891 Personal history of nicotine dependence: Secondary | ICD-10-CM | POA: Diagnosis not present

## 2021-05-21 DIAGNOSIS — J324 Chronic pansinusitis: Secondary | ICD-10-CM | POA: Diagnosis not present

## 2021-05-21 DIAGNOSIS — J343 Hypertrophy of nasal turbinates: Secondary | ICD-10-CM | POA: Diagnosis not present

## 2021-05-25 DIAGNOSIS — M9903 Segmental and somatic dysfunction of lumbar region: Secondary | ICD-10-CM | POA: Diagnosis not present

## 2021-05-25 DIAGNOSIS — M9905 Segmental and somatic dysfunction of pelvic region: Secondary | ICD-10-CM | POA: Diagnosis not present

## 2021-05-25 DIAGNOSIS — M5136 Other intervertebral disc degeneration, lumbar region: Secondary | ICD-10-CM | POA: Diagnosis not present

## 2021-05-25 DIAGNOSIS — M5432 Sciatica, left side: Secondary | ICD-10-CM | POA: Diagnosis not present

## 2021-05-27 DIAGNOSIS — M5432 Sciatica, left side: Secondary | ICD-10-CM | POA: Diagnosis not present

## 2021-05-27 DIAGNOSIS — M5136 Other intervertebral disc degeneration, lumbar region: Secondary | ICD-10-CM | POA: Diagnosis not present

## 2021-05-27 DIAGNOSIS — M9903 Segmental and somatic dysfunction of lumbar region: Secondary | ICD-10-CM | POA: Diagnosis not present

## 2021-05-27 DIAGNOSIS — M9905 Segmental and somatic dysfunction of pelvic region: Secondary | ICD-10-CM | POA: Diagnosis not present

## 2021-05-29 ENCOUNTER — Ambulatory Visit: Payer: PPO | Admitting: Internal Medicine

## 2021-05-29 ENCOUNTER — Other Ambulatory Visit: Payer: Self-pay

## 2021-05-29 ENCOUNTER — Encounter: Payer: Self-pay | Admitting: Internal Medicine

## 2021-05-29 VITALS — BP 122/70 | HR 92 | Ht 69.0 in | Wt 167.8 lb

## 2021-05-29 DIAGNOSIS — J387 Other diseases of larynx: Secondary | ICD-10-CM

## 2021-05-29 DIAGNOSIS — J45991 Cough variant asthma: Secondary | ICD-10-CM

## 2021-05-29 DIAGNOSIS — R053 Chronic cough: Secondary | ICD-10-CM | POA: Diagnosis not present

## 2021-05-29 NOTE — Progress Notes (Signed)
PCP Leighton Ruff, MD  HPI    IOV  07/08/2017  Chief Complaint  Patient presents with   Advice Only    Referred by Dr. Drema Dallas for cough x24months.  Pt states that his cough is  a dry cough. Pt was put on nexium x2 weeks for acid reflux which he states has loosened the mucus some.      75 year old male originally from [place] and a former Company secretary.  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  He tells me that mid September 2018 he went to the beach.  Upon return from the beach his granddaughter was sick with a cough and subsequently on April 16, 2017 he abruptly developed a cough.  Since then the cough has persisted.  The only thing that has improved and his cough is that he is no longer having significant night cough although he still does have some  amount of night cough.  This night cough resolved after opioid cough syrup.  Cough is made worse by talking, laughing and also randomly.  It is associated with clearing of the throat.  He feels a tightness in the upper chest.  There is no associated wheezing but there is still some residual nocturnal symptoms.  There is no associated acid reflux.  He has been on Nexium for a week or 2 without much relief.  He has not tried anything for nasal but he denies any nasal discharge.  He is not on any ACE inhibitors.  He does not have a previous history of asthma but his exam nitric oxide today is elevated borderline at 44 ppb.  He does clear the throat.  The cough is annoying.  He tells me that he did have a chest x-ray with his primary care physician that was clear.  The history is obtained from him, talking to his wife and review of the primary care physician referral notes.  Lab review she had a hemoglobin 11.6 g% in February 2016 and a eosinophils f 300 cells.  He is a former smoker  He does not have any shortness of breath.  He walks over a mile.  Sometimes he will cough and a pro-air can help him.  He does find the [employer] helps him.  At this  point in time he prefers for conservative simplistic line of treatment   OV 08/18/2017  Chief Complaint  Patient presents with   Follow-up    Pt states he has been doing good since last visit. Cough is better.   Follow-up chronic cough  After last visit he decided to take Asmanex.  He also followed a diet and took Prilosec.  With this the cough is more than 60% better as documented in the RSI cough score.  He does not want to take any more new medications.  He feels prednisone burst helped him a lot in the initial few days.  He is wondering which of these measures have helped him the most.  There are no new issues.  He wants to liberalize his diet which he says is actually helping him with the sugars and with the cough but he would like to be less disciplined about food.  He is willing to continue with Asmanex   feno 43 ppb  OV 02/28/2018  Chief Complaint  Patient presents with   Follow-up    Pt states things had been doing good for him and did go off of the inhaler but stated after he had been off  of the inhaler for about a week, the cough came back in the mornings and now he states he is coughing all the time. He states he is still not taking the medication and he states he is still coughing.    Follow-up chronic cough  He is here with his wife. He tells me that approximately one month ago because he was feeling well without any cough he stopped his Asmanex and then 2 weeks ago his cough returned. RSI cough score is 17. When he lies downhe has a cough. But he does not wake up in the middle of the night because of cough. There is no associated wheezing or shortness of breath or chest tightness or any change in his health status. Social history: His wife is new diagnosis of liver cirrhosis. She is going MRI today. Exam nitric oxide is in the indeterminate range today for him. Review of the chart indicates last chest x-ray was in 2010.    OV 05/31/2018  Subjective:  Patient ID: Isaac Weber, male , DOB: 1946/01/23 , age 75 y.o. , MRN: [id number] , ADDRESS: [address] [place] [facility] [address]   05/31/2018 -   Chief Complaint  Patient presents with   Follow-up    Doing well at this time.     HPI Isaac Weber 75 y.o. -presents for follow-up of cough variant asthma.  Last visit we put him back on Asmanex after his cough deteriorated.  This was in August 2019.  With this his cough is significantly improved RSI cough score is 5.  He realizes the value of taking Asmanex.  He takes his Asmanex 1 puff 2 times daily.  The only interim issues that March 09, 2018 he ended up in the ER lab review shows normal.  It was for dizziness.  Socially his wife Fraser Din has had liver cyst surgery at [facility] in October 2019 and is doing well.  He is up-to-date with his flu shot but our internal immunization record shows that he has not had pneumonia vaccine.        OV 02/20/2019  Subjective:  Patient ID: Isaac Weber, male , DOB: 03/18/1946 , age 7 y.o. , MRN: [id number] , ADDRESS: [address] [place] [facility] [address]   02/20/2019 -   Chief Complaint  Patient presents with   Cough    Having more trouble with phlegm.     HPI Isaac Weber 75 y.o. -presents for chronic cough and cough variant asthma.  He is a former smoker 42 pack.  Last chest x-ray was August 2019.  At that time it was clear.  No previous CT scan of the chest.  He tells me that overall he is doing stable although in the last 6 weeks he has had increasing phlegm production.  It is not acute.  He has been socially isolating.  He has not come into contact with anyone with COVID.  He does go to church but mostly does [employer].  His RSI cough score shows significant deterioration as documented below.  The phlegm is not green but more like white.    OV 06/16/2020  Subjective:  Patient ID: Isaac Weber, male , DOB: [birth year] , age 71 y.o. , MRN: [id number] , ADDRESS: [address] [place] [address] PCP  Parks Ranger Devonne Doughty, DO Patient Care Team: Olin Hauser, DO as PCP - General (Family Medicine) Jettie Booze, MD as PCP - Cardiology (Cardiology)  This Provider for this visit:  Treatment Team:  Attending Provider: Brand Males, MD    06/16/2020 -   Chief Complaint  Patient presents with   Follow-up    Pt states he had a flare up with asthma about 1 week ago which turned into pneumonia. Pt states he is now better. Has an occ cough which is better. Denies any complaints of wheezing or increased SOB.   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 -presents for follow-up.  Last seen in July 2020.  That was by me myself.  He presents now for follow-up.  He was supposed to go to [place] but because of Covid pandemic the history of his been postponed to March 2022.  He tells me it was the end of October 2021 he developed an asthma flare.  He is unclear why he had a flareup.  No sick contacts.  After that he played golf in [place] and this made it worse.  On number second 2020 when he had a chest x-ray with Dr. Raliegh Ip his primary care physician that showed some infiltrates.  He got antibiotics and prednisone.  This helped him.  Symptoms are improving but now he feels the cough is getting worse again.  He is wondering if it is his chronic cough getting worse.  Overall he continues to be compliant with his Asmanex.       Lab Results  Component Value Date   NITRICOXIDE 39 02/28/2018     OV 01/16/2021  Subjective:  Patient ID: Isaac Weber, male , DOB: [birth year] , age 45 y.o. , MRN: [id number] , ADDRESS: [address] [place] [facility] [address] PCP Parks Ranger Devonne Doughty, DO Patient Care Team: Olin Hauser, DO as PCP - General (Family Medicine) Jettie Booze, MD as PCP - Cardiology (Cardiology)  This Provider for this visit: Treatment  Team:  Attending Provider: Brand Males, MD    01/16/2021 -   Chief Complaint  Patient presents with   Follow-up    Patient reports that he was on prednisone about 6 months and worse in last 6 months, Yellow sputum at times when coughing.    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 -presents for follow-up.  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  He tells me since the early part of this year has had frequent exacerbations of cough requiring prednisone.  He had 1 course of prednisone in January and another 1 in February and then again in March before he went to [place] and after he came back from [place].  Each of this helped him but the most recent 1 after his retirement did not help him.  He says and also developed shortness of breath particularly while playing golf.  His cough is worse when he lies down particularly on his back or on the left side.  He says cough is so bad it wakes up his wife.  His RSI cough scores are significant deterioration.  He is not sure about wheezing.  Overall he feels deconditioned.  The cough makes him fatigued.  When he was in [place] he actually was feeling better.  He went to [place] around March 2022.  There is no fever.  He had a CT scan of the chest end of last year that showed no evidence of ILD.  Review of his labs indicate slight eosinophilia.  His nitric oxide exhaled test today  was elevated at 40 ppb which is in the gray zone.  He says he is compliant with his Asmanex.  On exam he did have crackles which I thought was present for the first time.  There is no edema orthopnea    Simple office walk 185 feet x  3 laps goal with forehead probe 01/16/2021   O2 used ra  Number laps completed 3  Comments about pace fast  Resting Pulse Ox/HR 98% and 96/min  Final Pulse Ox/HR 98% and 99/min  Desaturated </= 88% no  Desaturated <= 3% points no  Got Tachycardic >/= 90/min yes  Symptoms at end of test Mild ydspnea  Miscellaneous  comments x   Results for [place]" (MRN [id number]) as of 01/16/2021 11:16  Ref. Range 03/09/2018 18:36 03/09/2018 18:45 06/02/2020 08:52 10/01/2020 09:26  Eosinophils Absolute Latest Ref Range: 15 - 500 cells/uL 0.4  205    Lab Results  Component Value Date   NITRICOXIDE 40 01/16/2021     IMPRESSION: HRCT Dec 2021 1. No evidence of interstitial lung disease. No acute pulmonary disease. 2. Stable 4 mm left upper lobe solid pulmonary nodule, considered benign. 3. Mild centrilobular and paraseptal emphysema with diffuse bronchial wall thickening and saber sheath trachea, compatible with the provided history of COPD. 4. Two vessel coronary atherosclerosis. 5. Aortic Atherosclerosis (ICD10-I70.0) and Emphysema (ICD10-J43.9).     Electronically Signed   By: Ilona Sorrel M.D.   On: 07/24/2020 11:08     ROS - per HPI   OV 03/23/2021  Subjective:  Patient ID: Isaac Weber, male , DOB: 06/22/1946 , age 48 y.o. 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 -returns for follow-up.  In the year 2022 his cough is dramatically escalated.  It is now chronic.  He had several phone calls and phone messages because of this.  He see nurse practitioner 2 times.  2 visits ago the nurse practitioner started several nasal sprays and Breo but despite all this the cough persists unchanged.  She even added omeprazole but the  cough persist unchanged.  He has upcoming GI appointment locally.  He is frustrated by this.  Wife is here with him.  This is a social embarrassment and also concern in the family.  He has not seen ENT at all.  We recommended [facility] but it is 62 miles away from his home.  He is willing to see Dr. Blenda Nicely here in [place] who has expertise in chronic cough.  But he also wants to go through his GI appointment.  We discussed trial of asthma biologic empiric in case his refractory cough is because of uncontrolled asthma.  Is because he does cough at night and also brings up colored sputum occasionally.  We discussed the other option of doing gabapentin.  He wants to think about all this at this point.  He is willing to go through Dr.  Appointments.  He is worried about side effects of medications.  We could consider sputum evaluation at next visit.  There are no other new issues.  He did have imaging CT sinus and CT chest other than micronodules and some emphysematous allCLEAR.      OV 05/29/2021  Subjective:  Patient ID: Isaac Weber, male , DOB: Oct 02, 1945 , age 5 y.o. , MRN: [id number] , ADDRESS: [address] [place] [facility] [address] PCP Parks Ranger Devonne Doughty, DO Patient Care Team: Olin Hauser, DO as PCP - General (Family Medicine) Jettie Booze, MD as PCP - Cardiology (Cardiology)  This Provider for this visit: Treatment Team:  Attending Provider: Brand Males, MD    05/29/2021 -   Chief Complaint  Patient presents with   Follow-up    Productive cough with yellow sputum.    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  CT sinus August 2022 -Negative blood allergy test June 2022 with normal IgE currently on a 14-day prescription clindamycin course through ENT  HPI Isaac Weber 74 y.o. -evaluation of follow-up chronic  cough.  On 05/21/2021 did see ENTLouise Ingegerd Charlotta Nordbladh .  Notes reviewed.  He continues to be bothered by chronic cough.  He did have a fiberoptic exam and showed a low mucopurulence in the nasopharynx he has been started on a 14-day clindamycin course.  He is midway through it.  He has not noticed any difference.  He is wondering after reading article on the Internet but he has chronic tracheitis.  Informed him that laryngoscope he should pick this up and he should talk about this with the ENT if physician assistant.  Review of the records indicate that his vocal cords look normal with normal mobility and this hypopharynx was also normal.  At this point in time he has resolved that he will consider gabapentin if his cough does not improve.  But at this point he also wants to continue with the ENT plan.  I agree with this.  He continues on Trelegy.  I did inform him that this can paradoxically cause cough to become worse.    Dr Lorenza Cambridge Reflux Symptom Index (> 13-15 suggestive of LPR cough) Results for Isaac Weber, Isaac Weber (MRN [id number]) as of 05/31/2018 09:46  Ref. Range 03/09/2018 18:36 03/09/2018 18:45  Eosinophils Absolute Latest Ref Range: 0.0 - 0.7 K/uL 0.4    0 -> 5  =  none ->severe problem.td 07/08/2017  08/18/2017  02/28/2018  05/31/2018 asmanex 1 bid 02/20/2019  01/16/2021  03/23/2021   Hoarseness of problem with voice 1 2 2 3 3 1 1   Clearing  Of Throat 3 2 2  0 2 3 2   Excess throat mucus or feeling of post nasal drip 1 0 3 0 3 5 4   Difficulty swallowing food, liquid or tablets 0 0 0 0 0 0 0  Cough after eating or lying down 4 1 5 1 1 5 5   Breathing difficulties or choking episodes 0 0 0 0 0 4 3  Troublesome or annoying cough 5 2 5 1 3 5 5   Sensation of something sticking in throat or lump in throat 0 0 0 0 0 0 0  Heartburn, chest pain, indigestion, or stomach acid coming up 0 0 0 0 0 0 2  TOTAL 14 7 17 5 12 23 22      Lab Results  Component Value Date   NITRICOXIDE 40  01/16/2021     CT Chest data  No results found.    PFT  PFT Results Latest Ref Rng & Units 01/27/2021  FVC-Pre L 3.31  FVC-Predicted Pre % 84  FVC-Post L 3.48  FVC-Predicted Post % 89  Pre FEV1/FVC % % 82  Post FEV1/FCV % % 78  FEV1-Pre L 2.70  FEV1-Predicted Pre % 95  FEV1-Post L 2.73  DLCO uncorrected ml/min/mmHg 28.84  DLCO UNC% % 122  DLCO corrected ml/min/mmHg 29.53  DLCO COR %Predicted % 125  DLVA Predicted % 120  TLC L 6.62  TLC % Predicted % 99  RV % Predicted % 95       has a past medical history of Abnormal chest CT, Allergic rhinitis, unspecified, Anemia, Aortic atherosclerosis ([place]), BPH (benign prostatic hyperplasia), COPD (chronic obstructive pulmonary disease) ([place]), Coronary artery disease, Cough variant asthma, Glaucoma, Hypertension, Other chronic pain, Pancreatic lesion, Pure hypercholesterolemia, unspecified, SCC (squamous cell carcinoma) (03/10/2021), SCC (squamous cell carcinoma) (03/10/2021), 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   reports that he quit smoking about 49 years ago. His smoking use included cigarettes. He has a 78.00 pack-year smoking history. He has never used smokeless tobacco.  Past Surgical History:  Procedure Laterality Date   BACK SURGERY     BUNIONECTOMY Left 2003   hammer toe as well   BUNIONECTOMY WITH HAMMERTOE RECONSTRUCTION Left 2013   repeat   CERVICAL DISC SURGERY     COLONOSCOPY WITH PROPOFOL N/A 04/30/2021   Procedure: COLONOSCOPY WITH PROPOFOL;  Surgeon: Jonathon Bellows, MD;  Location: [facility] ENDOSCOPY;  Service: Gastroenterology;  Laterality: N/A;   GREEN LIGHT LASER TURP (TRANSURETHRAL RESECTION OF PROSTATE  2010   laser, shrink prostate   KNEE ARTHROSCOPY Right 1991   LEG SURGERY Left    BENIGN BONE TUMOR   LUMBAR Adeline SURGERY  2010   discectomy   LUMBAR DISC SURGERY  2011   PROSTATE SURGERY  2002   shrink prostate   ROTATOR CUFF REPAIR Left 1999    debride, remove bonespur   ROTATOR CUFF REPAIR Right 1996    Allergies  Allergen Reactions   Ciprofloxacin Diarrhea and Nausea And Vomiting   Duloxetine Other (See Comments)    Severe fatique and constipation.   Morphine And Related Diarrhea and Nausea And Vomiting    Immunization History  Administered Date(s) Administered   Fluad Quad(high Dose 65+) 05/13/2019, 06/09/2020, 04/21/2021   Influenza, High Dose Seasonal PF 07/15/2017, 04/12/2018   PFIZER(Purple Top)SARS-COV-2 Vaccination 02/13/2020, 03/05/2020   Pneumococcal Conjugate-13 01/28/2014   Pneumococcal Polysaccharide-23 03/04/2008, 08/26/2020   Tdap 07/12/2011   Zoster Recombinat (Shingrix) 04/12/2017, 08/17/2017    Family History  Problem Relation Age of Onset   Heart disease Mother 70   Emphysema Father 52   Heart disease Brother 41     Current Outpatient Medications:    albuterol (VENTOLIN HFA) 108 (90 Base) MCG/ACT inhaler, Inhale 1-2 puffs into the lungs every 4 (four) hours as needed for wheezing or shortness of breath (cough)., Disp: 1 each, Rfl: 2   amLODipine (NORVASC) 5 MG tablet, Take 1 tablet (5 mg total) by mouth daily., Disp: 90 tablet, Rfl: 3   Calcium Carb-Cholecalciferol (CALCIUM + D3 PO), Take by mouth., Disp: , Rfl:    clindamycin (CLEOCIN) 300 MG capsule, Take 300 mg by mouth 3 (three) times daily., Disp: , Rfl:    diclofenac Sodium (VOLTAREN) 1 % GEL, Apply topically  4 (four) times daily., Disp: , Rfl:    ferrous sulfate 325 (65 FE) MG tablet, Take 325 mg by mouth daily with breakfast., Disp: , Rfl:    fluticasone (FLONASE) 50 MCG/ACT nasal spray, USE ONE SPRAY IN EACH NOSTRIL DAILY, Disp: 48 g, Rfl: 3   Fluticasone-Umeclidin-Vilant (TRELEGY ELLIPTA) 200-62.5-25 MCG/INH AEPB, Inhale 1 puff into the lungs daily., Disp: 60 each, Rfl: 1   Fluticasone-Umeclidin-Vilant (TRELEGY ELLIPTA) 200-62.5-25 MCG/INH AEPB, Inhale 1 puff into the lungs daily., Disp: 1 each, Rfl: 0   FREESTYLE LITE test strip, Use  to check blood sugar up to 2 x daily, Disp: 100 each, Rfl: 5   Ginger, Zingiber officinalis, (GINGER PO), Take by mouth. Tea, Disp: , Rfl:    Glucosamine-Chondroitin (GLUCOSAMINE CHONDR COMPLEX PO), Take by mouth 2 (two) times daily., Disp: , Rfl:    hydrochlorothiazide (HYDRODIURIL) 12.5 MG tablet, Take 1 tablet (12.5 mg total) by mouth daily., Disp: 90 tablet, Rfl: 3   ibuprofen (ADVIL,MOTRIN) 200 MG tablet, Take 200-800 mg by mouth every 6 (six) hours as needed for fever, headache, mild pain, moderate pain or cramping. , Disp: , Rfl:    ketoconazole (NIZORAL) 2 % shampoo, SHAMPOO INTO SCALP, LET SIT FOR 10 MINUTES THEN WASH OFF. USE 3 TIMES PER WEEK, Disp: 120 mL, Rfl: 3   loratadine (CLARITIN) 10 MG tablet, Take 10 mg by mouth daily., Disp: , Rfl:    losartan (COZAAR) 50 MG tablet, Take 1 tablet (50 mg total) by mouth daily., Disp: 90 tablet, Rfl: 3   metFORMIN (GLUCOPHAGE-XR) 500 MG 24 hr tablet, Take 1 tablet (500 mg total) by mouth at bedtime., Disp: 90 tablet, Rfl: 3   Multiple Vitamin (MULTIVITAMIN WITH MINERALS) TABS tablet, Take 1 tablet by mouth daily., Disp: , Rfl:    omeprazole (PRILOSEC) 40 MG capsule, Take 1 capsule (40 mg total) by mouth in the morning and at bedtime., Disp: 90 capsule, Rfl: 0   rosuvastatin (CRESTOR) 5 MG tablet, Take 1 tablet (5 mg total) by mouth daily., Disp: 90 tablet, Rfl: 3   tamsulosin (FLOMAX) 0.4 MG CAPS capsule, Take 1 capsule (0.4 mg total) by mouth daily., Disp: 90 capsule, Rfl: 3   TRELEGY ELLIPTA 200-62.5-25 MCG/INH AEPB, INHALE 1 PUFF BY MOUTH INTO LUNGS DAILY, Disp: 60 each, Rfl: 4      Objective:   Vitals:   05/29/21 1338  BP: 122/70  Pulse: 92  SpO2: 99%  Weight: 167 lb 12.8 oz (76.1 kg)  Height: 5\' 9"  (1.753 m)    Estimated body mass index is 24.78 kg/m as calculated from the following:   Height as of this encounter: 5\' 9"  (1.753 m).   Weight as of this encounter: 167 lb 12.8 oz (76.1 kg).  @WEIGHTCHANGE @  [employer]    05/29/21 1338  Weight: 167 lb 12.8 oz (76.1 kg)     Physical Exam    General: No distress. Looks normal. Does cough Neuro: Alert and Oriented x 3. GCS 15. Speech normal Psych: Pleasant Resp:  Barrel Chest - no.  Wheeze - no, Crackles - no, No overt respiratory distress CVS: Normal heart sounds. Murmurs - no Ext: Stigmata of Connective Tissue Disease - no HEENT: Normal upper airway. PEERL +. No post nasal drip        Assessment:       ICD-10-CM   1. 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 Cough variant asthma  J45.991  Plan:     Patient Instructions     ICD-10-CM   1. 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  Other possibility is acid reflux despite being on omeprazole since June 2022  Currently on a 14-day clindamycin course through ENT  Vocal cord examination by ENT was normal 05/21/2021  Recommendations: Continue Trelegy as before Deberah Castle later if we can stop this] Continue clindamycin for ENT Continue flonase nasal spray once daily  Continue Astelin nasal 1 spray per nostril twice a day  Continue ocean saline nasal spray twice a day Continue mucinex 600mg  twice daily Other nonpharmaceutical interventions  -Every time he feel the urge to cough drink water or suck on sugarless lozenge or swallow his saliva  -Take 2-3 days completely off and do not whisper or talk  Follow-up - Return in 2-3 months but after completing the above  -If diagnostic work-up is noncontributory and cough still persists options to consider time-limited trials gabapentin [we extensively discussed the side effect profile of this and you are more open to this at this visit] or doing biologic injection such as Tepelezumab for asthma    SIGNATURE     Dr. Brand Males, M.D., F.C.C.P,  Pulmonary and Critical Care Medicine Staff Physician, [place] Director - Interstitial Lung Disease  Program  Pulmonary [facility] [place], [place], [address]  Pager: [phone], If no answer or between  15:00h - 7:00h: call [phone] Telephone: [phone]  1:58 PM 05/29/2021

## 2021-05-29 NOTE — Patient Instructions (Addendum)
ICD-10-CM   1. 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   Currently on a 14-day clindamycin course through ENT  Vocal cord examination by ENT was normal 05/21/2021  Recommendations: Continue Trelegy as before Deberah Castle later if we can stop this] Continue clindamycin for ENT Continue flonase nasal spray once daily  Continue Astelin nasal 1 spray per nostril twice a day  Continue ocean saline nasal spray twice a day Continue mucinex 600mg  twice daily Other nonpharmaceutical interventions  -Every time he feel the urge to cough drink water or suck on sugarless lozenge or swallow his saliva  -Take 2-3 days completely off and do not whisper or talk  Follow-up - Return in 2-3 months but after completing the above  - can consider bronch as well at next visit  -If diagnostic work-up is noncontributory and cough still persists options to consider time-limited trials gabapentin [we extensively discussed the side effect profile of this and you are more open to this at this visit] or doing biologic injection such as Tepelezumab for asthma

## 2021-05-30 NOTE — Telephone Encounter (Signed)
MR please see pts mychart message as his PCP has changed.  thanks

## 2021-06-01 DIAGNOSIS — M5136 Other intervertebral disc degeneration, lumbar region: Secondary | ICD-10-CM | POA: Diagnosis not present

## 2021-06-01 DIAGNOSIS — M5432 Sciatica, left side: Secondary | ICD-10-CM | POA: Diagnosis not present

## 2021-06-01 DIAGNOSIS — M9905 Segmental and somatic dysfunction of pelvic region: Secondary | ICD-10-CM | POA: Diagnosis not present

## 2021-06-01 DIAGNOSIS — M9903 Segmental and somatic dysfunction of lumbar region: Secondary | ICD-10-CM | POA: Diagnosis not present

## 2021-06-01 NOTE — Telephone Encounter (Signed)
My latest note smart text does indicate PCP is Olin Hauser, DO In 2018 note on top it says PCP is Dr Drema Dallas but even by Nov 2021 it says [place] [facility] Devonne Doughty, DO   See latest note - NOv 2022  PCP Olin Hauser, DO Patient Care Team: Olin Hauser, DO as PCP - General (Family Medicine) Jettie Booze, MD as PCP - Cardiology (Cardiology)  Xxx So question 1) is he is seeing top of my note whic is the old notes and reflectc Drema Dallas (if so the latest note is chronologically further below) 2) or is there a routing in the computer that by some error is going to Dr Drema Dallas  Thanks    SIGNATURE    Dr. Brand Males, M.D., F.C.C.P,  Pulmonary and Critical Care Medicine Staff Physician, [place] Director - Interstitial Lung Disease  Program  Pulmonary [place] [facility] [place], [place], [address]  NPI Number:  NPI #[id number]  Pager: [phone], If no answer  -> Check AMION or Try [phone] Telephone (clinical office): [phone] Telephone (research): [phone]  11:35 AM 06/01/2021

## 2021-06-03 DIAGNOSIS — M25511 Pain in right shoulder: Secondary | ICD-10-CM | POA: Diagnosis not present

## 2021-06-03 DIAGNOSIS — M7541 Impingement syndrome of right shoulder: Secondary | ICD-10-CM | POA: Diagnosis not present

## 2021-06-03 DIAGNOSIS — M25512 Pain in left shoulder: Secondary | ICD-10-CM | POA: Diagnosis not present

## 2021-06-08 ENCOUNTER — Telehealth: Payer: Self-pay | Admitting: Gastroenterology

## 2021-06-08 NOTE — Telephone Encounter (Signed)
LVM for patient to call our office to reschedule appt..(Provider has to fly out of town for an emergency)

## 2021-06-09 ENCOUNTER — Ambulatory Visit: Payer: PPO | Admitting: Gastroenterology

## 2021-06-12 ENCOUNTER — Other Ambulatory Visit: Payer: Self-pay

## 2021-06-12 DIAGNOSIS — J342 Deviated nasal septum: Secondary | ICD-10-CM | POA: Diagnosis not present

## 2021-06-12 DIAGNOSIS — J329 Chronic sinusitis, unspecified: Secondary | ICD-10-CM | POA: Diagnosis not present

## 2021-06-12 DIAGNOSIS — Z0000 Encounter for general adult medical examination without abnormal findings: Secondary | ICD-10-CM

## 2021-06-12 DIAGNOSIS — E1169 Type 2 diabetes mellitus with other specified complication: Secondary | ICD-10-CM

## 2021-06-12 DIAGNOSIS — I10 Essential (primary) hypertension: Secondary | ICD-10-CM

## 2021-06-12 DIAGNOSIS — J324 Chronic pansinusitis: Secondary | ICD-10-CM | POA: Diagnosis not present

## 2021-06-13 ENCOUNTER — Other Ambulatory Visit: Payer: Self-pay | Admitting: Internal Medicine

## 2021-06-14 NOTE — Progress Notes (Addendum)
Cardiology Office Note   Date:  06/15/2021   ID:  Isaac Weber, DOB [birth year], MRN [id number]  PCP:  Olin Hauser, DO    Chief Complaint  Patient presents with   Follow-up   Coronary calcification  Wt Readings from Last 3 Encounters:  06/15/21 169 lb (76.7 kg)  05/29/21 167 lb 12.8 oz (76.1 kg)  04/30/21 164 lb 5.3 oz (74.5 kg)       History of Present Illness: Isaac Weber is a 75 y.o. male   who is being seen today for the evaluation of coronary calcification at the request of Leighton Ruff, MD.   Chest CT scan in 02/2019 showed: IMPRESSION: 1. No evidence of interstitial lung disease. 2. Mild paraseptal emphysema, mild diffuse bronchial wall thickening and mild saber sheath trachea configuration, suggesting COPD. 3. Solitary left upper lobe 4 mm solid pulmonary nodule. No follow-up needed if patient is low-risk. Non-contrast chest CT can be considered in 12 months if patient is high-risk. This recommendation follows the consensus statement: Guidelines for Management of Incidental Pulmonary Nodules Detected on CT Images:From the Fleischner Society 2017; published online before print (10.1148/radiol.7209470962). 4. 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 5. Two vessel coronary atherosclerosis. Left anterior descending and left circumflex coronary atherosclerosis.   Plan in 03/2019 was :"Coronary calcification: No angina.  COntinue aggressive secondary prevention.  LDL 69. TG 52.  He will let us know about any exertional chest pain or change in exercise tolerance.  Will wait for result of pancreas CT scan and see what further cardiac treatment may be needed. 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 These  measure up to 1.6 cm and appear unilocular without internal enhancement. There appearance is nonspecific but favors of benign etiology. According to consensus criteria follow-up imaging is indicated. Repeat without and with contrast MRI of the pancreas in 6 months is recommended. "   Cough that he had for a year has resolved in 2022. He was treated for a sinus infection.   Denies : Chest pain. Dizziness. Leg edema. Nitroglycerin use. Orthopnea. Palpitations. Paroxysmal nocturnal dyspnea. Shortness of breath. Syncope.    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 WD SCC, ulcerated. [facility] 04/29/2020    Past Surgical History:  Procedure Laterality Date   BACK SURGERY     BUNIONECTOMY Left 2003   hammer toe as well   BUNIONECTOMY WITH HAMMERTOE RECONSTRUCTION Left 2013   repeat   CERVICAL DISC SURGERY  COLONOSCOPY WITH PROPOFOL N/A 04/30/2021   Procedure: COLONOSCOPY WITH PROPOFOL;  Surgeon: Jonathon Bellows, MD;  [facility];  Service: Gastroenterology;  Laterality: N/A;   GREEN LIGHT LASER TURP (TRANSURETHRAL RESECTION OF PROSTATE  2010   laser, shrink prostate   KNEE ARTHROSCOPY Right 1991   LEG SURGERY Left    BENIGN BONE TUMOR   LUMBAR [place] SURGERY  2010   discectomy   LUMBAR DISC SURGERY  2011    PROSTATE SURGERY  2002   shrink prostate   ROTATOR CUFF REPAIR Left 1999   debride, remove bonespur   ROTATOR CUFF REPAIR Right 1996     Current Outpatient Medications  Medication Sig Dispense Refill   albuterol (VENTOLIN HFA) 108 (90 Base) MCG/ACT inhaler Inhale 1-2 puffs into the lungs every 4 (four) hours as needed for wheezing or shortness of breath (cough). 1 each 2   amLODipine (NORVASC) 5 MG tablet Take 1 tablet (5 mg total) by mouth daily. 90 tablet 3   Calcium Carb-Cholecalciferol (CALCIUM + D3 PO) Take by mouth.     diclofenac Sodium (VOLTAREN) 1 % GEL Apply topically 4 (four) times daily.     ferrous sulfate 325 (65 FE) MG tablet Take 325 mg by mouth daily with breakfast.     fluticasone (FLONASE) 50 MCG/ACT nasal spray USE ONE SPRAY IN EACH NOSTRIL DAILY 48 g 3   Fluticasone-Umeclidin-Vilant (TRELEGY ELLIPTA) 200-62.5-25 MCG/INH AEPB Inhale 1 puff into the lungs daily. 60 each 1   Fluticasone-Umeclidin-Vilant (TRELEGY ELLIPTA) 200-62.5-25 MCG/INH AEPB Inhale 1 puff into the lungs daily. 1 each 0   FREESTYLE LITE test strip Use to check blood sugar up to 2 x daily 100 each 5   Ginger, Zingiber officinalis, (GINGER PO) Take by mouth. Tea     Glucosamine-Chondroitin (GLUCOSAMINE CHONDR COMPLEX PO) Take by mouth 2 (two) times daily.     hydrochlorothiazide (HYDRODIURIL) 12.5 MG tablet Take 1 tablet (12.5 mg total) by mouth daily. 90 tablet 3   ibuprofen (ADVIL,MOTRIN) 200 MG tablet Take 200-800 mg by mouth every 6 (six) hours as needed for fever, headache, mild pain, moderate pain or cramping.      ketoconazole (NIZORAL) 2 % shampoo SHAMPOO INTO SCALP, LET SIT FOR 10 MINUTES THEN WASH OFF. USE 3 TIMES PER WEEK 120 mL 3   loratadine (CLARITIN) 10 MG tablet Take 10 mg by mouth daily.     losartan (COZAAR) 50 MG tablet Take 1 tablet (50 mg total) by mouth daily. 90 tablet 3   meloxicam (MOBIC) 7.5 MG tablet Take 7.5 mg by mouth 2 (two) times daily.     metFORMIN (GLUCOPHAGE-XR) 500  MG 24 hr tablet Take 1 tablet (500 mg total) by mouth at bedtime. 90 tablet 3   Multiple Vitamin (MULTIVITAMIN WITH MINERALS) TABS tablet Take 1 tablet by mouth daily.     omeprazole (PRILOSEC) 40 MG capsule Take 1 capsule (40 mg total) by mouth in the morning and at bedtime. 90 capsule 0   rosuvastatin (CRESTOR) 5 MG tablet Take 1 tablet (5 mg total) by mouth daily. 90 tablet 3   tamsulosin (FLOMAX) 0.4 MG CAPS capsule Take 1 capsule (0.4 mg total) by mouth daily. 90 capsule 3   TRELEGY ELLIPTA 200-62.5-25 MCG/ACT AEPB INHALE 1 PUFF BY MOUTH INTO LUNGS ONCE DAILY 60 each 4   No current facility-administered medications for this visit.    Allergies:   Duloxetine, Ciprofloxacin, and Morphine and related    Social History:  The patient  reports  that he quit smoking about 49 years ago. His smoking use included cigarettes. He has a 78.00 pack-year smoking history. He has never used smokeless tobacco. He reports current alcohol use of about 2.0 standard drinks per week. He reports that he does not use drugs.   Family History:  The patient's family history includes Emphysema (age of onset: 32) in his father; Heart disease (age of onset: 37) in his mother; Heart disease (age of onset: 30) in his brother.    ROS:  Please see the history of present illness.   Otherwise, review of systems are positive for cough recently resolved.   All other systems are reviewed and negative.    PHYSICAL EXAM: VS:  BP 130/70   Pulse (!) 108   Ht 5\' 9"  (1.753 m)   Wt 169 lb (76.7 kg)   SpO2 98%   BMI 24.96 kg/m  , BMI Body mass index is 24.96 kg/m. GEN: Well nourished, well developed, in no acute distress HEENT: normal Neck: no JVD, carotid bruits, or masses Cardiac: RRR; no murmurs, rubs, or gallops,no edema  Respiratory:  clear to auscultation bilaterally, normal work of breathing GI: soft, nontender, nondistended, + BS MS: no deformity or atrophy, 2+ DP pulses bilaterally Skin: warm and dry, no  rash Neuro:  Strength and sensation are intact Psych: euthymic mood, full affect   EKG:   The ekg ordered today demonstrates sinus tach, left axis deviation R wave progression decreased; normal LVEF by July 2022 echo  Recent Labs: 01/16/2021: Hemoglobin 13.8; NT-Pro BNP <5; Platelets 225.0   Lipid Panel    Component Value Date/Time   CHOL 136 06/02/2020 0852   TRIG 84 06/02/2020 0852   HDL 69 06/02/2020 0852   CHOLHDL 2.0 06/02/2020 0852   LDLCALC 51 06/02/2020 0852     Other studies Reviewed: Additional studies/ records that were reviewed today with results demonstrating: labs reviewed.   ASSESSMENT AND PLAN:  Coronary calcification: No angina.  Walks regularly. Continue aggressive secondary prevention.   HTN: The current medical regimen is effective;  continue present plan and medications.Repeat HR 96. DM: A1C 6.8- went to 7.0 while on steroid.  Hyperlipidemia: LDL 51. Continue rosuvastatin.  Await 2022 lipids from this AM.    Current medicines are reviewed at length with the patient today.  The patient concerns regarding his medicines were addressed.  The following changes have been made:  No change  Labs/ tests ordered today include:  No orders of the defined types were placed in this encounter.   Recommend 150 minutes/week of aerobic exercise Low fat, low carb, high fiber diet recommended  Disposition:   FU in 1 year   Signed, Larae Grooms, MD  06/15/2021 1:30 PM    [facility] [place] [place], [place], [place]  [address] Phone: [phone]; Fax: [phone]

## 2021-06-15 ENCOUNTER — Ambulatory Visit: Payer: PPO | Admitting: Interventional Cardiology

## 2021-06-15 ENCOUNTER — Other Ambulatory Visit: Payer: Self-pay

## 2021-06-15 ENCOUNTER — Other Ambulatory Visit: Payer: PPO

## 2021-06-15 ENCOUNTER — Encounter: Payer: Self-pay | Admitting: Interventional Cardiology

## 2021-06-15 VITALS — BP 130/70 | HR 108 | Ht 69.0 in | Wt 169.0 lb

## 2021-06-15 DIAGNOSIS — E782 Mixed hyperlipidemia: Secondary | ICD-10-CM

## 2021-06-15 DIAGNOSIS — E119 Type 2 diabetes mellitus without complications: Secondary | ICD-10-CM

## 2021-06-15 DIAGNOSIS — E785 Hyperlipidemia, unspecified: Secondary | ICD-10-CM | POA: Diagnosis not present

## 2021-06-15 DIAGNOSIS — I10 Essential (primary) hypertension: Secondary | ICD-10-CM

## 2021-06-15 DIAGNOSIS — I2510 Atherosclerotic heart disease of native coronary artery without angina pectoris: Secondary | ICD-10-CM | POA: Diagnosis not present

## 2021-06-15 DIAGNOSIS — Z0000 Encounter for general adult medical examination without abnormal findings: Secondary | ICD-10-CM | POA: Diagnosis not present

## 2021-06-15 DIAGNOSIS — E1169 Type 2 diabetes mellitus with other specified complication: Secondary | ICD-10-CM | POA: Diagnosis not present

## 2021-06-15 NOTE — Patient Instructions (Signed)
Medication Instructions:  Your physician recommends that you continue on your current medications as directed. Please refer to the Current Medication list given to you today.  *If you need a refill on your cardiac medications before your next appointment, please call your pharmacy*   Lab Work: none If you have labs (blood work) drawn today and your tests are completely normal, you will receive your results only by: MyChart Message (if you have MyChart) OR A paper copy in the mail If you have any lab test that is abnormal or we need to change your treatment, we will call you to review the results.   Testing/Procedures: none   Follow-Up: At [facility], you and your health needs are our priority.  As part of our continuing mission to provide you with exceptional heart care, we have created designated Provider Care Teams.  These Care Teams include your primary Cardiologist (physician) and Advanced Practice Providers (APPs -  Physician Assistants and Nurse Practitioners) who all work together to provide you with the care you need, when you need it.  We recommend signing up for the patient portal called "MyChart".  Sign up information is provided on this After Visit Summary.  MyChart is used to connect with patients for Virtual Visits (Telemedicine).  Patients are able to view lab/test results, encounter notes, upcoming appointments, etc.  Non-urgent messages can be sent to your provider as well.   To learn more about what you can do with MyChart, go to [email or web address].    Your next appointment:   12 month(s)  The format for your next appointment:   In Person  Provider:   Jayadeep Varanasi, MD     Other Instructions  High-Fiber Eating Plan Fiber, also called dietary fiber, is a type of carbohydrate. It is found foods such as fruits, vegetables, whole grains, and beans. A high-fiber diet can have many health benefits. Your health care provider may recommend a high-fiber diet  to help: Prevent constipation. Fiber can make your bowel movements more regular. Lower your cholesterol. Relieve the following conditions: Inflammation of veins in the anus (hemorrhoids). Inflammation of specific areas of the digestive tract (uncomplicated diverticulosis). A problem of the large intestine, also called the colon, that sometimes causes pain and diarrhea (irritable bowel syndrome, or IBS). Prevent overeating as part of a weight-loss plan. Prevent heart disease, type 2 diabetes, and certain cancers. What are tips for following this plan? Reading food labels  Check the nutrition facts label on food products for the amount of dietary fiber. Choose foods that have 5 grams of fiber or more per serving. The goals for recommended daily fiber intake include: Men (age 50 or younger): 34-38 g. Men (over age 50): 28-34 g. Women (age 50 or younger): 25-28 g. Women (over age 50): 22-25 g. Your daily fiber goal is _____________ g. Shopping Choose whole fruits and vegetables instead of processed forms, such as apple juice or applesauce. Choose a wide variety of high-fiber foods such as avocados, lentils, oats, and kidney beans. Read the nutrition facts label of the foods you choose. Be aware of foods with added fiber. These foods often have high sugar and sodium amounts per serving. Cooking Use whole-grain flour for baking and cooking. Cook with brown rice instead of white rice. Meal planning Start the day with a breakfast that is high in fiber, such as a cereal that contains 5 g of fiber or more per serving. Eat breads and cereals that are made with whole-grain flour instead of   refined flour or white flour. Eat brown rice, bulgur wheat, or millet instead of white rice. Use beans in place of meat in soups, salads, and pasta dishes. Be sure that half of the grains you eat each day are whole grains. General information You can get the recommended daily intake of dietary fiber  by: Eating a variety of fruits, vegetables, grains, nuts, and beans. Taking a fiber supplement if you are not able to take in enough fiber in your diet. It is better to get fiber through food than from a supplement. Gradually increase how much fiber you consume. If you increase your intake of dietary fiber too quickly, you may have bloating, cramping, or gas. Drink plenty of water to help you digest fiber. Choose high-fiber snacks, such as berries, raw vegetables, nuts, and popcorn. What foods should I eat? Fruits Berries. Pears. Apples. Oranges. Avocado. Prunes and raisins. Dried figs. Vegetables Sweet potatoes. Spinach. Kale. Artichokes. Cabbage. Broccoli. Cauliflower. Green peas. Carrots. Squash. Grains Whole-grain breads. Multigrain cereal. Oats and oatmeal. Brown rice. Barley. Bulgur wheat. Millet. Quinoa. Bran muffins. Popcorn. Rye wafer crackers. Meats and other proteins Navy beans, kidney beans, and pinto beans. Soybeans. Split peas. Lentils. Nuts and seeds. Dairy Fiber-fortified yogurt. Beverages Fiber-fortified soy milk. Fiber-fortified orange juice. Other foods Fiber bars. The items listed above may not be a complete list of recommended foods and beverages. Contact a dietitian for more information. What foods should I avoid? Fruits Fruit juice. Cooked, strained fruit. Vegetables Fried potatoes. Canned vegetables. Well-cooked vegetables. Grains White bread. Pasta made with refined flour. White rice. Meats and other proteins Fatty cuts of meat. Fried chicken or fried fish. Dairy Milk. Yogurt. Cream cheese. Sour cream. Fats and oils Butters. Beverages Soft drinks. Other foods Cakes and pastries. The items listed above may not be a complete list of foods and beverages to avoid. Talk with your dietitian about what choices are best for you. Summary Fiber is a type of carbohydrate. It is found in foods such as fruits, vegetables, whole grains, and beans. A high-fiber  diet has many benefits. It can help to prevent constipation, lower blood cholesterol, aid weight loss, and reduce your risk of heart disease, diabetes, and certain cancers. Increase your intake of fiber gradually. Increasing fiber too quickly may cause cramping, bloating, and gas. Drink plenty of water while you increase the amount of fiber you consume. The best sources of fiber include whole fruits and vegetables, whole grains, nuts, seeds, and beans. This information is not intended to replace advice given to you by your health care provider. Make sure you discuss any questions you have with your health care provider. Document Revised: 11/15/2019 Document Reviewed: 11/15/2019 Elsevier Patient Education  2022 Elsevier Inc.   

## 2021-06-16 ENCOUNTER — Encounter: Payer: Self-pay | Admitting: Internal Medicine

## 2021-06-16 LAB — ACID FAST CULTURE WITH REFLEXED SENSITIVITIES (MYCOBACTERIA): Acid Fast Culture: NEGATIVE

## 2021-06-16 LAB — COMPLETE METABOLIC PANEL WITH GFR
AG Ratio: 1.7 (calc) (ref 1.0–2.5)
ALT: 21 U/L (ref 9–46)
AST: 15 U/L (ref 10–35)
Albumin: 3.8 g/dL (ref 3.6–5.1)
Alkaline phosphatase (APISO): 60 U/L (ref 35–144)
BUN: 23 mg/dL (ref 7–25)
CO2: 28 mmol/L (ref 20–32)
Calcium: 9.5 mg/dL (ref 8.6–10.3)
Chloride: 101 mmol/L (ref 98–110)
Creat: 0.92 mg/dL (ref 0.70–1.28)
Globulin: 2.2 g/dL (calc) (ref 1.9–3.7)
Glucose, Bld: 127 mg/dL — ABNORMAL HIGH (ref 65–99)
Potassium: 4 mmol/L (ref 3.5–5.3)
Sodium: 136 mmol/L (ref 135–146)
Total Bilirubin: 0.8 mg/dL (ref 0.2–1.2)
Total Protein: 6 g/dL — ABNORMAL LOW (ref 6.1–8.1)
eGFR: 86 mL/min/{1.73_m2} (ref >=60)

## 2021-06-16 LAB — LIPID PANEL
Cholesterol: 126 mg/dL (ref <200)
HDL: 58 mg/dL (ref >=40)
LDL Cholesterol (Calc): 54 mg/dL (calc)
Non-HDL Cholesterol (Calc): 68 mg/dL (calc) (ref <130)
Total CHOL/HDL Ratio: 2.2 (calc) (ref <5.0)
Triglycerides: 59 mg/dL (ref <150)

## 2021-06-16 LAB — CBC WITH DIFFERENTIAL/PLATELET
Absolute Monocytes: 687 cells/uL (ref 200–950)
Basophils Absolute: 47 cells/uL (ref 0–200)
Basophils Relative: 0.6 %
Eosinophils Absolute: 292 cells/uL (ref 15–500)
Eosinophils Relative: 3.7 %
HCT: 39.4 % (ref 38.5–50.0)
Hemoglobin: 13.7 g/dL (ref 13.2–17.1)
Lymphs Abs: 1414 cells/uL (ref 850–3900)
MCH: 32.1 pg (ref 27.0–33.0)
MCHC: 34.8 g/dL (ref 32.0–36.0)
MCV: 92.3 fL (ref 80.0–100.0)
MPV: 9.5 fL (ref 7.5–12.5)
Monocytes Relative: 8.7 %
Neutro Abs: 5459 cells/uL (ref 1500–7800)
Neutrophils Relative %: 69.1 %
Platelets: 191 10*3/uL (ref 140–400)
RBC: 4.27 10*6/uL (ref 4.20–5.80)
RDW: 13 % (ref 11.0–15.0)
Total Lymphocyte: 17.9 %
WBC: 7.9 10*3/uL (ref 3.8–10.8)

## 2021-06-16 LAB — HEMOGLOBIN A1C
Hgb A1c MFr Bld: 6.7 % of total Hgb — ABNORMAL HIGH (ref <5.7)
Mean Plasma Glucose: 146 mg/dL
eAG (mmol/L): 8.1 mmol/L

## 2021-06-17 NOTE — Telephone Encounter (Signed)
Will route to MR to make him aware.

## 2021-06-21 NOTE — Telephone Encounter (Signed)
This is great news. Please appreciate the patient Isaac Weber on my behalf for sticking with plan and working with [place]. Took a while but glad we have made progress. Please send this note to the PA in ENT so she feels aprpeciated  Thanks  Reply not needed  MR

## 2021-06-22 ENCOUNTER — Ambulatory Visit (INDEPENDENT_AMBULATORY_CARE_PROVIDER_SITE_OTHER): Payer: PPO | Admitting: Gastroenterology

## 2021-06-22 ENCOUNTER — Ambulatory Visit (INDEPENDENT_AMBULATORY_CARE_PROVIDER_SITE_OTHER): Payer: PPO | Admitting: Family Medicine

## 2021-06-22 ENCOUNTER — Encounter: Payer: Self-pay | Admitting: Family Medicine

## 2021-06-22 ENCOUNTER — Other Ambulatory Visit: Payer: Self-pay

## 2021-06-22 ENCOUNTER — Encounter: Payer: Self-pay | Admitting: Gastroenterology

## 2021-06-22 VITALS — BP 138/73 | HR 95 | Temp 98.5°F | Ht 69.0 in | Wt 165.0 lb

## 2021-06-22 VITALS — BP 127/63 | HR 101 | Ht 69.0 in | Wt 164.2 lb

## 2021-06-22 DIAGNOSIS — M67911 Unspecified disorder of synovium and tendon, right shoulder: Secondary | ICD-10-CM | POA: Diagnosis not present

## 2021-06-22 DIAGNOSIS — E785 Hyperlipidemia, unspecified: Secondary | ICD-10-CM

## 2021-06-22 DIAGNOSIS — I10 Essential (primary) hypertension: Secondary | ICD-10-CM | POA: Diagnosis not present

## 2021-06-22 DIAGNOSIS — R053 Chronic cough: Secondary | ICD-10-CM

## 2021-06-22 DIAGNOSIS — Z0000 Encounter for general adult medical examination without abnormal findings: Secondary | ICD-10-CM

## 2021-06-22 DIAGNOSIS — E1169 Type 2 diabetes mellitus with other specified complication: Secondary | ICD-10-CM | POA: Diagnosis not present

## 2021-06-22 DIAGNOSIS — M6281 Muscle weakness (generalized): Secondary | ICD-10-CM | POA: Diagnosis not present

## 2021-06-22 DIAGNOSIS — G8929 Other chronic pain: Secondary | ICD-10-CM | POA: Diagnosis not present

## 2021-06-22 DIAGNOSIS — M25511 Pain in right shoulder: Secondary | ICD-10-CM | POA: Diagnosis not present

## 2021-06-22 NOTE — Progress Notes (Signed)
Subjective:    Patient ID: Isaac Weber, male    DOB: 07-28-1945, 75 y.o.   MRN: [id number]  Isaac Weber is a 75 y.o. male presenting on 06/22/2021 for Annual Exam   HPI  CHRONIC DM, Type 2 Hyperlipidemia ASCVD Risk LDL controlled. He remains on Rosuvastatin 40m daily for cardiovascular prevention. A1c down to 6.7 Home CBG readings avg 120-130s Meds: Metformin XR 5062mdaily at bedtime Reports good compliance. Tolerating well w/o side-effects Currently on ARB, Statin Denies hypoglycemia, polyuria, visual changes, numbness or tingling.   ASCVD Risk LDL controlled. He remains on Rosuvastatin 7m67maily for cardiovascular prevention.  Cysts, multiple internal  History of multiple internal cysts including liver, kidney etc Previous PCP managing this problem with surveillance Pancreatic cysts Completed MR Abdomen 10/21 and now last done 11/2020. Recommended next at 1 year repeat. As finding has been stable. First onset identified on imaging 03/2019.   CHRONIC HTN: Reports normally avg 120s, had higher reading here. He gets BP checked every 8 weeks with donating blood. Current Meds - Losartan-HCTZ 50-12.7mg52mily, Amlodipine 7mg 42mly Reports good compliance, took meds today. Tolerating well, w/o complaints. Denies CP, dyspnea, HA, edema, dizziness / lightheadedness   History of Iron Deficiency Donates blood every 8 weeks. He takes iron supplement.   Osteoarthritis, bilateral knees, lumbar spine Chronic problem, multiple joints, episodic pain and flares He has has seen chiropractor as well. He said he tried Relief Factor, for 7 weeks limited effect. - Uses Voltaren topical PRN with good results - Not effective Tylenol Taking Motrin 200mg 53m= 800mg P6mnly on day of golf. Not daily or regularly He is able to play golf weekly and is very active  Bilateral Rotator Cuff Impingement / Shoulder Pain [facility] muscle strengthening. Consideration of cortisone injections in future.   BPH LUTS s/p procedure, laser 2010 improvement - he has some BPH LUTS, some frequency, some urgency, nocturia 3 x nightly - taking Tamsulosin 0.4mg dai10m and may try 2 eventually - Previous GreensboTherapist, music longer goes, will have DRE regularly and PSA   GERD Thought chronic cough. Omeprazole 40mg twi53m day, he plans to reduce back to once daily.  [facility], [facility] Am[facility] after camera/light into sinuses and CT, has resolved his chronic cough.  Health Maintenance:   Future reconsider 2nd dose PNA Vaccine Pneumovax23 now after age 23.   UTD42lu Shot  Depression screen PHQ [facility] 04/07/2021 12/15/2020  Decreased Interest 0 0 0  Down, Depressed, Hopeless 0 0 0  PHQ - 2 Score 0 0 0  Altered sleeping 0 - 0  Tired, decreased energy 0 - 0  Change in appetite 0 - 0  Feeling bad or failure about yourself  0 - 0  Trouble concentrating 0 - 0  Moving slowly or fidgety/restless 0 - 0  Suicidal thoughts 0 - 0  PHQ-9 Score 0 - 0  Difficult doing work/chores Not difficult at all - Not difficult at all    Past Medical History:  Diagnosis Date   Abnormal chest CT    Allergic rhinitis, unspecified    Anemia    Aortic atherosclerosis (HCC)    BPH (benign prostatic hyperplasia)    COPD (chronic obstructive pulmonary disease) (HCC)    Coronary artery disease    Cough variant asthma    Glaucoma  Hypertension    Other chronic pain    Pancreatic lesion    Pure hypercholesterolemia, unspecified    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  WD SCC, ulcerated. [facility] 04/29/2020   Past Surgical History:  Procedure Laterality Date   BACK SURGERY     BUNIONECTOMY Left 2003   hammer toe as well   BUNIONECTOMY WITH HAMMERTOE RECONSTRUCTION Left 2013   repeat   CERVICAL DISC SURGERY     COLONOSCOPY WITH PROPOFOL N/A 04/30/2021   Procedure: COLONOSCOPY WITH PROPOFOL;  Surgeon: Jonathon Bellows, MD;  Location: [facility] ENDOSCOPY;  Service: Gastroenterology;  Laterality: N/A;   GREEN LIGHT LASER TURP (TRANSURETHRAL RESECTION OF PROSTATE  2010   laser, shrink prostate   KNEE ARTHROSCOPY Right 1991   LEG SURGERY Left    BENIGN BONE TUMOR   LUMBAR [place] SURGERY  2010   discectomy   LUMBAR DISC SURGERY  2011   PROSTATE SURGERY  2002   shrink prostate   ROTATOR CUFF REPAIR Left 1999   debride, remove bonespur   ROTATOR CUFF REPAIR Right 1996   Social History   Socioeconomic History   Marital status: Married    Spouse name: Not on file   Number of children: Not on file   Years of education: high school   Highest education level: High school graduate  Occupational History   Occupation: retired  Tobacco Use   Smoking status: Former    Packs/day: 3.00    Years: 26.00    Pack years: 78.00    Types: Cigarettes    Quit date: 09/17/1971    Years since quitting: 49.7   Smokeless tobacco: Never  Vaping Use   Vaping Use: Never used  Substance and Sexual Activity   Alcohol use: Yes    Alcohol/week: 2.0 standard drinks    Types: 2 Standard drinks or equivalent per week   Drug use: No   Sexual activity: Not on file  Other Topics Concern   Not on file  Social History Narrative   Not on file   Social Determinants of Health   Financial Resource Strain: Low Risk    Difficulty of Paying Living Expenses: Not hard at all  Food Insecurity: No Food Insecurity   Worried About Charity fundraiser in the Last Year: Never true   [place] in the Last Year: Never true  Transportation Needs: No Transportation Needs   Lack of Transportation  (Medical): No   Lack of Transportation (Non-Medical): No  Physical Activity: Insufficiently Active   Days of Exercise per Week: 7 days   Minutes of Exercise per Session: 20 min  Stress: No Stress Concern Present   Feeling of Stress : Not at all  Social Connections: Not on file  Intimate Partner Violence: Not on file   Family History  Problem Relation Age of Onset   Heart disease Mother 60   Emphysema Father 73   Heart disease Brother 61   Current Outpatient Medications on File Prior to Visit  Medication Sig   albuterol (VENTOLIN HFA) 108 (90 Base) MCG/ACT inhaler Inhale 1-2 puffs into the lungs every 4 (four) hours as needed for wheezing or shortness of  breath (cough).   amLODipine (NORVASC) 5 MG tablet Take 1 tablet (5 mg total) by mouth daily.   Calcium Carb-Cholecalciferol (CALCIUM + D3 PO) Take by mouth.   diclofenac Sodium (VOLTAREN) 1 % GEL Apply topically 4 (four) times daily.   ferrous sulfate 325 (65 FE) MG tablet Take 325 mg by mouth daily with breakfast.   fluticasone (FLONASE) 50 MCG/ACT nasal spray USE ONE SPRAY IN EACH NOSTRIL DAILY   Fluticasone-Umeclidin-Vilant (TRELEGY ELLIPTA) 200-62.5-25 MCG/INH AEPB Inhale 1 puff into the lungs daily.   FREESTYLE LITE test strip Use to check blood sugar up to 2 x daily   Ginger, Zingiber officinalis, (GINGER PO) Take by mouth. Tea   Glucosamine-Chondroitin (GLUCOSAMINE CHONDR COMPLEX PO) Take by mouth 2 (two) times daily.   hydrochlorothiazide (HYDRODIURIL) 12.5 MG tablet Take 1 tablet (12.5 mg total) by mouth daily.   ibuprofen (ADVIL,MOTRIN) 200 MG tablet Take 200-800 mg by mouth every 6 (six) hours as needed for fever, headache, mild pain, moderate pain or cramping.    ketoconazole (NIZORAL) 2 % shampoo SHAMPOO INTO SCALP, LET SIT FOR 10 MINUTES THEN WASH OFF. USE 3 TIMES PER WEEK   loratadine (CLARITIN) 10 MG tablet Take 10 mg by mouth daily.   losartan (COZAAR) 50 MG tablet Take 1 tablet (50 mg total) by mouth daily.    metFORMIN (GLUCOPHAGE-XR) 500 MG 24 hr tablet Take 1 tablet (500 mg total) by mouth at bedtime.   Multiple Vitamin (MULTIVITAMIN WITH MINERALS) TABS tablet Take 1 tablet by mouth daily.   rosuvastatin (CRESTOR) 5 MG tablet Take 1 tablet (5 mg total) by mouth daily.   tamsulosin (FLOMAX) 0.4 MG CAPS capsule Take 1 capsule (0.4 mg total) by mouth daily.   TRELEGY ELLIPTA 200-62.5-25 MCG/ACT AEPB INHALE 1 PUFF BY MOUTH INTO LUNGS ONCE DAILY   meloxicam (MOBIC) 7.5 MG tablet Take 7.5 mg by mouth 2 (two) times daily. (Patient not taking: Reported on 06/22/2021)   omeprazole (PRILOSEC) 40 MG capsule Take 1 capsule (40 mg total) by mouth in the morning and at bedtime. (Patient not taking: Reported on 06/22/2021)   No current facility-administered medications on file prior to visit.    Review of Systems  Constitutional:  Negative for activity change, appetite change, chills, diaphoresis, fatigue and fever.  HENT:  Negative for congestion and hearing loss.   Eyes:  Negative for visual disturbance.  Respiratory:  Negative for cough, chest tightness, shortness of breath and wheezing.   Cardiovascular:  Negative for chest pain, palpitations and leg swelling.  Gastrointestinal:  Negative for abdominal pain, constipation, diarrhea, nausea and vomiting.  Genitourinary:  Negative for dysuria, frequency and hematuria.  Musculoskeletal:  Negative for arthralgias and neck pain.  Skin:  Negative for rash.  Neurological:  Negative for dizziness, weakness, light-headedness, numbness and headaches.  Hematological:  Negative for adenopathy.  Psychiatric/Behavioral:  Negative for behavioral problems, dysphoric mood and sleep disturbance.   Per HPI unless specifically indicated above     Objective:    BP 127/63   Pulse (!) 101   Ht _0  (1.753 m)   Wt 164 lb 3.2 oz (74.5 kg)   SpO2 99%   BMI 24.25 kg/m   Wt Readings from Last 3 Encounters:  06/22/21 164 lb 3.2 oz (74.5 kg)  06/15/21 169 lb (76.7 kg)   05/29/21 167 lb 12.8 oz (76.1 kg)    Physical Exam Vitals and nursing note reviewed.  Constitutional:      General: He is not in acute  distress.    Appearance: He is well-developed. He is not diaphoretic.     Comments: Well-appearing, comfortable, cooperative  HENT:     Head: Normocephalic and atraumatic.  Eyes:     General:        Right eye: No discharge.        Left eye: No discharge.     Conjunctiva/sclera: Conjunctivae normal.     Pupils: Pupils are equal, round, and reactive to light.  Neck:     Thyroid: No thyromegaly.  Cardiovascular:     Rate and Rhythm: Normal rate and regular rhythm.     Pulses: Normal pulses.     Heart sounds: Normal heart sounds. No murmur heard. Pulmonary:     Effort: Pulmonary effort is normal. No respiratory distress.     Breath sounds: Normal breath sounds. No wheezing or rales.  Abdominal:     General: Bowel sounds are normal. There is no distension.     Palpations: Abdomen is soft. There is no mass.     Tenderness: There is no abdominal tenderness.  Musculoskeletal:        General: No tenderness. Normal range of motion.     Cervical back: Normal range of motion and neck supple.     Right lower leg: No edema.     Left lower leg: No edema.     Comments: Upper / Lower Extremities: - Normal muscle tone, strength bilateral upper extremities 5/5, lower extremities 5/5  Lymphadenopathy:     Cervical: No cervical adenopathy.  Skin:    General: Skin is warm and dry.     Findings: No erythema or rash.  Neurological:     Mental Status: He is alert and oriented to person, place, and time.     Comments: Distal sensation intact to light touch all extremities  Psychiatric:        Mood and Affect: Mood normal.        Behavior: Behavior normal.        Thought Content: Thought content normal.     Comments: Well groomed, good eye contact, normal speech and thoughts    CLINICAL DATA:  Follow-up pancreatic cysts/pseudocysts. No known injury, prior  relevant surgery or malignancy. Asymptomatic.   EXAM: MRI ABDOMEN WITHOUT AND WITH CONTRAST   TECHNIQUE: Multiplanar multisequence MR imaging of the abdomen was performed both before and after the administration of intravenous contrast.   CONTRAST:  8m GADAVIST GADOBUTROL 1 MMOL/ML IV SOLN   COMPARISON:  Abdominal MRI 05/16/2020, 11/13/2019 and 04/24/2019.   FINDINGS: Despite efforts by the technologist and patient, mild to moderate motion artifact is present on today's exam and could not be eliminated. This reduces exam sensitivity and specificity.   Lower chest:  The visualized lower chest appears unremarkable.   Hepatobiliary: Small T2 hyperintense hepatic lesions shown to represent cysts and hemangiomas on the prior study are unchanged in size. These are not optimally seen on today's postcontrast images due to motion artifact. No enlarging lesions identified. Small gallstones. No evidence gallbladder wall thickening or biliary dilatation.   Pancreas: The pancreas is atrophied without ductal dilatation or surrounding inflammation. Multiple cystic pancreatic lesions are again noted, suboptimally evaluated due to motion, although grossly unchanged from the previous study. The largest lesion in the pancreatic tail measures 1.9 x 1.3 cm on image 18/11, which is not significantly changed allowing for slight measurement differences related to the motion. No associated abnormal enhancement following contrast.   Spleen: Stable 14 mm cyst anteriorly. No  suspicious lesion or splenomegaly.   Adrenals/Urinary Tract: Both adrenal glands appear normal. Stable renal cortical and parapelvic cysts bilaterally. No evidence of enhancing renal mass or hydronephrosis.   Stomach/Bowel: The stomach appears unremarkable for its degree of distension. No evidence of bowel wall thickening, distention or surrounding inflammatory change.   Vascular/Lymphatic: There are no enlarged abdominal  lymph nodes. No significant vascular findings.   Other: No evidence of abdominal wall hernia or ascites.   Musculoskeletal: No acute or significant osseous findings. Mild lumbar spondylosis.   IMPRESSION: 1. The multiple small pancreatic cystic lesions are grossly stable, without abnormal enhancement or pancreatic ductal dilatation. 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 Per consensus guidelines, follow-up in 1 year recommended. Given the motion limitations of MRI for this patient, consider contrast enhanced CT for the imaging modality. This recommendation follows ACR consensus guidelines: Management of Incidental Pancreatic Cysts: A White Paper of the ACR Incidental Findings Committee. [place] 5573;22:025-427. 2. No acute abdominal findings. 3. Grossly stable additional incidental findings including hepatic cysts/hemangiomas, renal cysts and cholelithiasis.     Electronically Signed   By: Richardean Sale M.D.   On: 12/07/2020 11:08  Diabetic Foot Exam - Simple   Simple Foot Form Diabetic Foot exam was performed with the following findings: Yes 06/22/2021  9:37 AM  Visual Inspection See comments: Yes Sensation Testing Intact to touch and monofilament testing bilaterally: Yes Pulse Check Posterior Tibialis and Dorsalis pulse intact bilaterally: Yes Comments Bilateral feet with small callus formation heels and forefoot lateral, some hammer toe deformity 2nd toe each side. Normal nails.     Results for orders placed or performed in visit on 06/12/21  Lipid panel  Result Value Ref Range   Cholesterol 126 <200 mg/dL   HDL 58 > OR = 40 mg/dL   Triglycerides 59 <150 mg/dL   LDL Cholesterol (Calc) 54 mg/dL (calc)   Total CHOL/HDL Ratio 2.2 <5.0 (calc)   Non-HDL Cholesterol (Calc) 68 <130 mg/dL (calc)  COMPLETE METABOLIC PANEL WITH GFR  Result Value Ref Range   Glucose, Bld 127 (H) 65 - 99 mg/dL   BUN 23 7 - 25 mg/dL   Creat 0.92 0.70 - 1.28 mg/dL    eGFR 86 > OR = 60 mL/min/1.77m   BUN/Creatinine Ratio NOT APPLICABLE 6 - 22 (calc)   Sodium 136 135 - 146 mmol/L   Potassium 4.0 3.5 - 5.3 mmol/L   Chloride 101 98 - 110 mmol/L   CO2 28 20 - 32 mmol/L   Calcium 9.5 8.6 - 10.3 mg/dL   Total Protein 6.0 (L) 6.1 - 8.1 g/dL   Albumin 3.8 3.6 - 5.1 g/dL   Globulin 2.2 1.9 - 3.7 g/dL (calc)   AG Ratio 1.7 1.0 - 2.5 (calc)   Total Bilirubin 0.8 0.2 - 1.2 mg/dL   Alkaline phosphatase (APISO) 60 35 - 144 U/L   AST 15 10 - 35 U/L   ALT 21 9 - 46 U/L  CBC with Differential/Platelet  Result Value Ref Range   WBC 7.9 3.8 - 10.8 Thousand/uL   RBC 4.27 4.20 - 5.80 Million/uL   Hemoglobin 13.7 13.2 - 17.1 g/dL   HCT 39.4 38.5 - 50.0 %   MCV 92.3 80.0 - 100.0 fL   MCH 32.1 27.0 - 33.0 pg   MCHC 34.8 32.0 - 36.0 g/dL   RDW 13.0 11.0 - 15.0 %   Platelets 191 140 - 400 Thousand/uL   MPV 9.5 7.5 - 12.5 fL  Neutro Abs 5,459 1,500 - 7,800 cells/uL   Lymphs Abs 1,414 850 - 3,900 cells/uL   Absolute Monocytes 687 200 - 950 cells/uL   Eosinophils Absolute 292 15 - 500 cells/uL   Basophils Absolute 47 0 - 200 cells/uL   Neutrophils Relative % 69.1 %   Total Lymphocyte 17.9 %   Monocytes Relative 8.7 %   Eosinophils Relative 3.7 %   Basophils Relative 0.6 %  Hemoglobin A1c  Result Value Ref Range   Hgb A1c MFr Bld 6.7 (H) <5.7 % of total Hgb   Mean Plasma Glucose 146 mg/dL   eAG (mmol/L) 8.1 mmol/L      Assessment & Plan:   Problem List Items Addressed This Visit     Type 2 diabetes mellitus with other specified complication (HCC)    A8D 6.7 controlled Complications - hyperlipidemia increases risk of future cardiovascular complications   Plan:  1. Continue current therapy - Metformin XR 534m daily 2. Encourage improved lifestyle - low carb, low sugar diet, reduce portion size, continue improving regular exercise 3. Check CBG, bring log to next visit for review 4. Continue ARB, Statin  DM Foot completed, request copy DM Eye       Hyperlipidemia associated with type 2 diabetes mellitus ([place]    Controlled cholesterol on statin lifestyle  Plan: 1. Continue current meds - Rosuvastatin 567mdaily 2. Encourage improved lifestyle - low carb/cholesterol, reduce portion size, continue improving regular exercise      Essential hypertension    Controlled Home BP reviewed No known complications     Plan:  1. Continue current BP regimen - Losartan 5095mnd HCTZ 12.5mg45mily, Amlodipine 5mg 67mly 2. Encourage improved lifestyle - low sodium diet, regular exercise 3. Continue monitor BP outside office, bring readings to next visit, if persistently >140/90 or new symptoms notify office sooner      Other Visit Diagnoses     Annual physical exam    -  Primary       Updated Health Maintenance information Reviewed recent lab results with patient Encouraged improvement to lifestyle with diet and exercise Goal of weight loss   No orders of the defined types were placed in this encounter.     Follow up plan: Return in about 6 months (around 12/20/2021) for 6 month follow-up DM A1c, MRI abdomen cyst results.  [facility] 06/22/2021, 9:19 AM

## 2021-06-22 NOTE — Assessment & Plan Note (Signed)
Controlled cholesterol on statin lifestyle  Plan: 1. Continue current meds - Rosuvastatin 5mg daily 2. Encourage improved lifestyle - low carb/cholesterol, reduce portion size, continue improving regular exercise 

## 2021-06-22 NOTE — Patient Instructions (Signed)
Please stop taking your Omeprazole.

## 2021-06-22 NOTE — Assessment & Plan Note (Signed)
A1c 6.7 controlled Complications - hyperlipidemia increases risk of future cardiovascular complications   Plan:  1. Continue current therapy - Metformin XR 500mg  daily 2. Encourage improved lifestyle - low carb, low sugar diet, reduce portion size, continue improving regular exercise 3. Check CBG, bring log to next visit for review 4. Continue ARB, Statin  DM Foot completed, request copy DM Eye

## 2021-06-22 NOTE — Progress Notes (Signed)
Jonathon Bellows MD, MRCP(U.K) [address]  [place]  [place], [place] [address]  Main: [phone]  Fax: [phone]   Primary Care Physician: Olin Hauser, DO  Primary Gastroenterologist:  Dr. Jonathon Bellows   Chief complaint follow-up for chronic cough  HPI: Isaac Weber is a 75 y.o. male   Summary of history :  Initially referred and seen on 04/13/2021 for chronic cough.  He was seen by Dr. Chase Caller on 03/23/2021 for cough of 3 months duration.  Former Company secretary.  Prior history of asthma.  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  Concern for acid reflux.  Has been referred to ENT as well.CT chest high-resolution in 03/17/2021 showed no clear features of interstitial lung disease.  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   He states that he has been having productive and nonproductive cough on a daily basis since October of last year.  Worse when he lays flat.  Denies any heartburn.  Undergone an extensive work-up.  The question is whether he had acid reflux.  Interval history   04/13/2021-06/22/2021  05/20/2021: Colonoscopy: T7 polyps resected 6 out of which were tubular adenomas they were all subcentimeter. 06/15/2021: HbA1c 6.7, hemoglobin 13.7 g, After his last visit with me he was seen by an ENT who performed a triple endoscopy and states that he was told that had inflamed sinuses treated with a course of clindamycin and since then the cough has resolved.    Current Outpatient Medications  Medication Sig Dispense Refill   albuterol (VENTOLIN HFA) 108 (90 Base) MCG/ACT inhaler Inhale 1-2 puffs into the lungs every 4 (four) hours as needed for wheezing or shortness of breath (cough). 1 each 2   amLODipine (NORVASC) 5 MG tablet Take 1 tablet (5 mg  total) by mouth daily. 90 tablet 3   Calcium Carb-Cholecalciferol (CALCIUM + D3 PO) Take by mouth.     diclofenac Sodium (VOLTAREN) 1 % GEL Apply topically 4 (four) times daily.     ferrous sulfate 325 (65 FE) MG tablet Take 325 mg by mouth daily with breakfast.     fluticasone (FLONASE) 50 MCG/ACT nasal spray USE ONE SPRAY IN EACH NOSTRIL DAILY 48 g 3   Fluticasone-Umeclidin-Vilant (TRELEGY ELLIPTA) 200-62.5-25 MCG/INH AEPB Inhale 1 puff into the lungs daily. 60 each 1   FREESTYLE LITE test strip Use to check blood sugar up to 2 x daily 100 each 5   Ginger, Zingiber officinalis, (GINGER PO) Take by mouth. Tea     Glucosamine-Chondroitin (GLUCOSAMINE CHONDR COMPLEX PO) Take by mouth 2 (two) times daily.     hydrochlorothiazide (HYDRODIURIL) 12.5 MG tablet Take 1 tablet (12.5 mg total) by mouth daily. 90 tablet 3   ibuprofen (ADVIL,MOTRIN) 200 MG tablet Take 200-800 mg by mouth every 6 (six) hours as needed for fever, headache, mild pain, moderate pain or cramping.      ketoconazole (NIZORAL) 2 % shampoo SHAMPOO INTO SCALP, LET SIT FOR 10 MINUTES THEN WASH OFF. USE 3 TIMES PER WEEK 120 mL 3   loratadine (CLARITIN) 10 MG tablet Take 10 mg by mouth daily.     losartan (COZAAR) 50 MG tablet Take 1 tablet (50 mg total) by mouth daily. 90 tablet 3   meloxicam (MOBIC) 7.5 MG tablet Take 7.5 mg by mouth 2 (two)  times daily. (Patient not taking: Reported on 06/22/2021)     metFORMIN (GLUCOPHAGE-XR) 500 MG 24 hr tablet Take 1 tablet (500 mg total) by mouth at bedtime. 90 tablet 3   Multiple Vitamin (MULTIVITAMIN WITH MINERALS) TABS tablet Take 1 tablet by mouth daily.     omeprazole (PRILOSEC) 40 MG capsule Take 1 capsule (40 mg total) by mouth in the morning and at bedtime. (Patient not taking: Reported on 06/22/2021) 90 capsule 0   rosuvastatin (CRESTOR) 5 MG tablet Take 1 tablet (5 mg total) by mouth daily. 90 tablet 3   tamsulosin (FLOMAX) 0.4 MG CAPS capsule Take 1 capsule (0.4 mg total) by mouth  daily. 90 capsule 3   TRELEGY ELLIPTA 200-62.5-25 MCG/ACT AEPB INHALE 1 PUFF BY MOUTH INTO LUNGS ONCE DAILY 60 each 4   No current facility-administered medications for this visit.    Allergies as of 06/22/2021 - Review Complete 06/22/2021  Allergen Reaction Noted   Morphine and related Diarrhea and Nausea And Vomiting 04/05/2014    ROS:  General: Negative for anorexia, weight loss, fever, chills, fatigue, weakness. ENT: Negative for hoarseness, difficulty swallowing , nasal congestion. CV: Negative for chest pain, angina, palpitations, dyspnea on exertion, peripheral edema.  Respiratory: Negative for dyspnea at rest, dyspnea on exertion, cough, sputum, wheezing.  GI: See history of present illness. GU:  Negative for dysuria, hematuria, urinary incontinence, urinary frequency, nocturnal urination.  Endo: Negative for unusual weight change.    Physical Examination:   There were no vitals taken for this visit.  General: Well-nourished, well-developed in no acute distress.  Neuro: Alert and oriented x 3.  Grossly intact. Skin: Warm and dry, no jaundice.   Psych: Alert and cooperative, normal mood and affect.   Imaging Studies: No results found.  Assessment and Plan:   Isaac Weber is a 75 y.o. y/o male is here today to follow-up for chronic cough.  Since last visit has been treated for a sinus infection and also his cough has resolved.      Plan 1.  Repeat MRI of the abdomen to follow-up pancreatic cystic lesions in May 2023 2.  Can stop PPI.    Dr Jonathon Bellows  MD,MRCP [facility]) Follow up in as needed

## 2021-06-22 NOTE — Assessment & Plan Note (Addendum)
Controlled Home BP reviewed No known complications     Plan:  1. Continue current BP regimen - Losartan 50mg and HCTZ 12.5mg daily, Amlodipine 5mg daily 2. Encourage improved lifestyle - low sodium diet, regular exercise 3. Continue monitor BP outside office, bring readings to next visit, if persistently >140/90 or new symptoms notify office sooner 

## 2021-06-22 NOTE — Patient Instructions (Addendum)
Thank you for coming to the office today.  Keep up with Orthopedics  May use Meloxicam 7.5mg  as needed, as discussed you can take it occasionally once per day as needed.  Agree with lowering Omeprazole from 40mg  x 2 down to 40mg  daily.  1 more MRI Abdomen - please contact me on mychart in about 5-6 months, when ready to pursue repeat scan. Last was 12/05/20, we would need to order this around that time or later.  We can follow up with you in 6 months.   Please schedule a Follow-up Appointment to: Return in about 6 months (around 12/20/2021) for 6 month follow-up DM A1c, MRI abdomen cyst results.  If you have any other questions or concerns, please feel free to call the office or send a message through [place]. You may also schedule an earlier appointment if necessary.  Additionally, you may be receiving a survey about your experience at our office within a few days to 1 week by e-mail or mail. We value your feedback.  Nobie Putnam, DO [place]

## 2021-06-25 ENCOUNTER — Ambulatory Visit: Payer: PPO | Admitting: Dermatology

## 2021-06-25 ENCOUNTER — Other Ambulatory Visit: Payer: Self-pay

## 2021-06-25 DIAGNOSIS — L578 Other skin changes due to chronic exposure to nonionizing radiation: Secondary | ICD-10-CM

## 2021-06-25 DIAGNOSIS — L570 Actinic keratosis: Secondary | ICD-10-CM | POA: Diagnosis not present

## 2021-06-25 DIAGNOSIS — L820 Inflamed seborrheic keratosis: Secondary | ICD-10-CM

## 2021-06-25 DIAGNOSIS — L821 Other seborrheic keratosis: Secondary | ICD-10-CM | POA: Diagnosis not present

## 2021-06-25 NOTE — Progress Notes (Signed)
Follow-Up Visit   Subjective  Isaac Weber is a 75 y.o. male who presents for the following: Follow-up (6 mo AK f/u. Pt had PDT to face 01/22/21, he reports no issues after treatment. 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 ). The patient has spots, moles and lesions to be evaluated, some may be new or changing and the patient has concerns that these could be cancer.  The following portions of the chart were reviewed this encounter and updated as appropriate:  Tobacco  Allergies  Meds  Problems  Med Hx  Surg Hx  Fam Hx     Review of Systems: No other skin or systemic complaints except as noted in HPI or Assessment and Plan.  Objective  Well appearing patient in no apparent distress; mood and affect are within normal limits.  A focused examination was performed including face, scalp, arms, legs. Relevant physical exam findings are noted in the Assessment and Plan.  face and ears x 7, b/l arms and hands x 22 (29) Erythematous thin papules/macules with gritty scale.   b/l arms x 2 (2) Erythematous keratotic or waxy stuck-on papule or plaque.   Assessment & Plan  AK (actinic keratosis) (29) face and ears x 7, b/l arms and hands x 22 Actinic keratoses are precancerous spots that appear secondary to cumulative UV radiation exposure/sun exposure over time. They are chronic with expected duration over 1 year. A portion of actinic keratoses will progress to squamous cell carcinoma of the skin. It is not possible to reliably predict which spots will progress to skin cancer and so treatment is recommended to prevent development of skin cancer.  Recommend daily broad spectrum sunscreen SPF 30+ to sun-exposed areas, reapply every 2 hours as needed.  Recommend staying in the shade or wearing long sleeves, sun glasses (UVA+UVB protection) and wide brim hats (4-inch brim around the entire circumference of the hat). Call for new or changing lesions.  Prior to procedure, discussed  risks of blister formation, small wound, skin dyspigmentation, or rare scar following cryotherapy. Recommend Vaseline ointment to treated areas while healing.  Destruction of lesion - face and ears x 7, b/l arms and hands x 22 Complexity: simple   Destruction method: cryotherapy   Informed consent: discussed and consent obtained   Timeout:  patient name, date of birth, surgical site, and procedure verified Lesion destroyed using liquid nitrogen: Yes   Region frozen until ice ball extended beyond lesion: Yes   Outcome: patient tolerated procedure well with no complications   Post-procedure details: wound care instructions given    Inflamed seborrheic keratosis b/l arms x 2 Prior to procedure, discussed risks of blister formation, small wound, skin dyspigmentation, or rare scar following cryotherapy. Recommend Vaseline ointment to treated areas while healing. Destruction of lesion - b/l arms x 2 Complexity: simple   Destruction method: cryotherapy   Informed consent: discussed and consent obtained   Timeout:  patient name, date of birth, surgical site, and procedure verified Lesion destroyed using liquid nitrogen: Yes   Region frozen until ice ball extended beyond lesion: Yes   Outcome: patient tolerated procedure well with no complications   Post-procedure details: wound care instructions given    Actinic Damage - Severe, confluent actinic changes with pre-cancerous actinic keratoses  - Severe, chronic, not at goal, secondary to cumulative UV radiation exposure over time - diffuse scaly erythematous macules and papules with underlying dyspigmentation - Discussed Prescription "Field Treatment" for Severe, Chronic Confluent Actinic  Changes with Pre-Cancerous Actinic Keratoses Field treatment involves treatment of an entire area of skin that has confluent Actinic Changes (Sun/ Ultraviolet light damage) and PreCancerous Actinic Keratoses by method of PhotoDynamic Therapy (PDT) and/or  prescription Topical Chemotherapy agents such as 5-fluorouracil, 5-fluorouracil/calcipotriene, and/or imiquimod.  The purpose is to decrease the number of clinically evident and subclinical PreCancerous lesions to prevent progression to development of skin cancer by chemically destroying early precancer changes that may or may not be visible.  It has been shown to reduce the risk of developing skin cancer in the treated area. As a result of treatment, redness, scaling, crusting, and open sores may occur during treatment course. One or more than one of these methods may be used and may have to be used several times to control, suppress and eliminate the PreCancerous changes. Discussed treatment course, expected reaction, and possible side effects. - Recommend daily broad spectrum sunscreen SPF 30+ to sun-exposed areas, reapply every 2 hours as needed.  - Staying in the shade or wearing long sleeves, sun glasses (UVA+UVB protection) and wide brim hats (4-inch brim around the entire circumference of the hat) are also recommended. - Call for new or changing lesions. -Plan PDT to arms and hands in January  Seborrheic Keratoses - Stuck-on, waxy, tan-brown papules and/or plaques  - Benign-appearing - Discussed benign etiology and prognosis. - Observe - Call for any changes  Return in about 6 months (around 12/24/2021) for PDT arms in January, 6 mo AK f/u with Dr. Dara Lords, Harriett Sine, CMA, am acting as scribe for Sarina Ser, MD. Documentation: I have reviewed the above documentation for accuracy and completeness, and I agree with the above.  Sarina Ser, MD

## 2021-06-25 NOTE — Patient Instructions (Signed)

## 2021-06-30 ENCOUNTER — Encounter: Payer: Self-pay | Admitting: Family Medicine

## 2021-07-07 ENCOUNTER — Encounter: Payer: Self-pay | Admitting: Dermatology

## 2021-07-26 ENCOUNTER — Other Ambulatory Visit: Payer: Self-pay | Admitting: Family Medicine

## 2021-07-26 DIAGNOSIS — I10 Essential (primary) hypertension: Secondary | ICD-10-CM

## 2021-07-26 DIAGNOSIS — E785 Hyperlipidemia, unspecified: Secondary | ICD-10-CM

## 2021-07-26 DIAGNOSIS — E1169 Type 2 diabetes mellitus with other specified complication: Secondary | ICD-10-CM

## 2021-07-28 DIAGNOSIS — J342 Deviated nasal septum: Secondary | ICD-10-CM | POA: Insufficient documentation

## 2021-07-28 DIAGNOSIS — Z8709 Personal history of other diseases of the respiratory system: Secondary | ICD-10-CM | POA: Diagnosis not present

## 2021-07-28 DIAGNOSIS — Z87891 Personal history of nicotine dependence: Secondary | ICD-10-CM | POA: Diagnosis not present

## 2021-07-28 DIAGNOSIS — J439 Emphysema, unspecified: Secondary | ICD-10-CM | POA: Diagnosis not present

## 2021-07-28 DIAGNOSIS — J3489 Other specified disorders of nose and nasal sinuses: Secondary | ICD-10-CM | POA: Diagnosis not present

## 2021-07-30 ENCOUNTER — Ambulatory Visit: Payer: PPO

## 2021-07-30 ENCOUNTER — Encounter: Payer: Self-pay | Admitting: Internal Medicine

## 2021-07-30 ENCOUNTER — Other Ambulatory Visit: Payer: Self-pay

## 2021-07-30 DIAGNOSIS — L570 Actinic keratosis: Secondary | ICD-10-CM

## 2021-07-30 MED ORDER — AMINOLEVULINIC ACID HCL 20 % EX SOLR
2.0000 "application " | Freq: Once | CUTANEOUS | Status: AC
  Administered 2021-07-30: 708 mg via TOPICAL

## 2021-07-30 NOTE — Patient Instructions (Signed)

## 2021-07-30 NOTE — Progress Notes (Signed)
1. AK (actinic keratosis) (4) Left Hand - Posterior; Right Hand - Posterior; Left Forearm - Posterior; Right Forearm - Posterior  Photodynamic therapy - Left Forearm - Posterior, Left Hand - Posterior, Right Forearm - Posterior, Right Hand - Posterior Procedure discussed: discussed risks, benefits, side effects. and alternatives   Prep: site scrubbed/prepped with acetone   Location:  B/l hands and arms Number of lesions:  Multiple Type of treatment:  Blue light Aminolevulinic Acid (see MAR for details): Levulan Number of Levulan sticks used:  2 Incubation time (minutes):  120 Number of minutes under lamp:  16 Number of seconds under lamp:  40 Cooling:  Floor fan Outcome: patient tolerated procedure well with no complications   Post-procedure details: sunscreen applied and aftercare instructions given to patient    Aminolevulinic Acid HCl 20 % SOLR 708 mg - Left Forearm - Posterior, Left Hand - Posterior, Right Forearm - Posterior, Right Hand - Posterior

## 2021-07-30 NOTE — Telephone Encounter (Signed)
Dr. Chase Caller, please advise on pt's email regarding pt's upcoming appt and medications. Thanks.

## 2021-07-31 NOTE — Telephone Encounter (Signed)
Ok to go back to [employer] to change to 1 year followup

## 2021-08-13 ENCOUNTER — Ambulatory Visit: Payer: PPO | Admitting: Internal Medicine

## 2021-08-17 ENCOUNTER — Telehealth: Payer: Self-pay | Admitting: Internal Medicine

## 2021-08-18 ENCOUNTER — Encounter: Payer: Self-pay | Admitting: Family Medicine

## 2021-08-18 MED ORDER — ASMANEX HFA 200 MCG/ACT IN AERO: 2.0000 | INHALATION_SPRAY | Freq: Two times a day (BID) | RESPIRATORY_TRACT | 11 refills | Status: DC

## 2021-08-18 NOTE — Telephone Encounter (Signed)
Rx for pt's Asmanex has been sent to preferred pharmacy. This was okayed to be switched in recent [employer]. Called and spoke with pt letting him know this had been done and he verbalized understanding. Nothing further needed.

## 2021-08-19 MED ORDER — ASMANEX HFA 200 MCG/ACT IN AERO: 2.0000 | INHALATION_SPRAY | Freq: Two times a day (BID) | RESPIRATORY_TRACT | 11 refills | Status: DC

## 2021-08-19 NOTE — Addendum Note (Signed)
Addended by: Lorretta Harp on: 08/19/2021 03:22 PM   Modules accepted: Orders

## 2021-08-20 ENCOUNTER — Other Ambulatory Visit: Payer: Self-pay | Admitting: Internal Medicine

## 2021-08-21 ENCOUNTER — Telehealth: Payer: Self-pay | Admitting: Internal Medicine

## 2021-08-21 MED ORDER — FLUTICASONE PROPIONATE 50 MCG/ACT NA SUSP: NASAL | 3 refills | Status: DC

## 2021-08-21 NOTE — Telephone Encounter (Signed)
rx sent

## 2021-08-21 NOTE — Telephone Encounter (Signed)
I called the patient and he is aware that his pharmacy has a refill of his flonase spray. Nothing further needed.

## 2021-08-24 ENCOUNTER — Other Ambulatory Visit: Payer: Self-pay

## 2021-08-24 ENCOUNTER — Ambulatory Visit (INDEPENDENT_AMBULATORY_CARE_PROVIDER_SITE_OTHER): Payer: PPO | Admitting: Family Medicine

## 2021-08-24 VITALS — BP 129/72 | HR 105 | Temp 98.2°F | Resp 17 | Ht 69.0 in | Wt 167.8 lb

## 2021-08-24 DIAGNOSIS — M19011 Primary osteoarthritis, right shoulder: Secondary | ICD-10-CM

## 2021-08-24 DIAGNOSIS — M25511 Pain in right shoulder: Secondary | ICD-10-CM

## 2021-08-24 DIAGNOSIS — G8929 Other chronic pain: Secondary | ICD-10-CM

## 2021-08-24 DIAGNOSIS — M7541 Impingement syndrome of right shoulder: Secondary | ICD-10-CM

## 2021-08-24 DIAGNOSIS — M19012 Primary osteoarthritis, left shoulder: Secondary | ICD-10-CM | POA: Diagnosis not present

## 2021-08-24 MED ORDER — BACLOFEN 10 MG PO TABS
5.0000 mg | ORAL_TABLET | Freq: Two times a day (BID) | ORAL | 2 refills | Status: DC | PRN
Start: 2021-08-24 — End: 2022-05-14

## 2021-08-24 NOTE — Progress Notes (Signed)
Subjective:    Patient ID: Isaac Weber, male    DOB: 08-18-1945, 76 y.o.   MRN: [id number]  Isaac Weber is a 76 y.o. male presenting on 08/24/2021 for Shoulder Pain (Intermittent Rt shoulder pain, that worsen with different  ROM. x 4 mths  With history of Rotator cuff surgery in the past.  The pt was seen by orthopedic x 3 mths ago and had physical therapy with no improvement. )   HPI  Right Shoulder Impingement / Chronic Shoulder Pain  05/2021 Isaac Weber had PT therapy, cortisone injection only temporary relief 2 weeks max, he had X-ray done at that time as well showed AC joint mild DJD.  He is not interested in surgical approach at this time.  Previously on Meloxicam 7.5mg  daily PRN with some relief in the past.  Can still golf, difficulty with carrying clubs but has no problem with actual swing.  Wakes him up at night occasionally if turns over.  Today interested in other evaluation and management.  Never on muscle relaxants.  Takes topical Voltaren PRN  He has history of prior rotator cuff issues documented in past with prior surgery, 1996  Depression screen [facility] 2/9 06/22/2021 04/07/2021 12/15/2020  Decreased Interest 0 0 0  Down, Depressed, Hopeless 0 0 0  PHQ - 2 Score 0 0 0  Altered sleeping 0 - 0  Tired, decreased energy 0 - 0  Change in appetite 0 - 0  Feeling bad or failure about yourself  0 - 0  Trouble concentrating 0 - 0  Moving slowly or fidgety/restless 0 - 0  Suicidal thoughts 0 - 0  PHQ-9 Score 0 - 0  Difficult doing work/chores Not difficult at all - Not difficult at all   Past Surgical History:  Procedure Laterality Date   BACK SURGERY     BUNIONECTOMY Left 2003   hammer toe as well   BUNIONECTOMY WITH HAMMERTOE RECONSTRUCTION Left 2013   repeat   CERVICAL DISC SURGERY     COLONOSCOPY WITH PROPOFOL N/A 04/30/2021   Procedure: COLONOSCOPY WITH PROPOFOL;  Surgeon: Jonathon Bellows, MD;  [facility];  Service:  Gastroenterology;  Laterality: N/A;   GREEN LIGHT LASER TURP (TRANSURETHRAL RESECTION OF PROSTATE  2010   laser, shrink prostate   KNEE ARTHROSCOPY Right 1991   LEG SURGERY Left    BENIGN BONE TUMOR   LUMBAR [place] SURGERY  2010   discectomy   LUMBAR DISC SURGERY  2011   PROSTATE SURGERY  2002   shrink prostate   ROTATOR CUFF REPAIR Left 1999   debride, remove bonespur   ROTATOR CUFF REPAIR Right 1996     Social History   Tobacco Use   Smoking status: Former    Packs/day: 3.00    Years: 26.00    Pack years: 78.00    Types: Cigarettes    Quit date: 09/17/1971    Years since quitting: 49.9   Smokeless tobacco: Never  Vaping Use   Vaping Use: Never used  Substance Use Topics   Alcohol use: Yes    Alcohol/week: 2.0 standard drinks    Types: 2 Standard drinks or equivalent per week   Drug use: No    Review of Systems Per HPI unless specifically indicated above     Objective:    BP 129/72 (BP Location: Left Arm, Patient Position: Sitting, Cuff Size: Normal)    Pulse (!) 105    Temp 98.2 F (36.8 C) (Temporal)    Resp  17    Ht 5\' 9"  (1.753 m)    Wt 167 lb 12.8 oz (76.1 kg)    SpO2 100%    BMI 24.78 kg/m   Wt Readings from Last 3 Encounters:  08/24/21 167 lb 12.8 oz (76.1 kg)  06/22/21 165 lb (74.8 kg)  06/22/21 164 lb 3.2 oz (74.5 kg)    Physical Exam Vitals and nursing note reviewed.  Constitutional:      General: He is not in acute distress.    Appearance: Normal appearance. He is well-developed. He is not diaphoretic.     Comments: Well-appearing, comfortable, cooperative  HENT:     Head: Normocephalic and atraumatic.  Eyes:     General:        Right eye: No discharge.        Left eye: No discharge.     Conjunctiva/sclera: Conjunctivae normal.  Cardiovascular:     Rate and Rhythm: Normal rate.  Pulmonary:     Effort: Pulmonary effort is normal.  Musculoskeletal:     Comments: Right Shoulder Inspection: Normal appearance bilateral symmetrical Palpation:  Mild tender anterior shoulder. ROM: Full intact active ROM forward flexion, internal / external rotation, symmetrical - he has pain provoked with abduction of shoulder. Special Testing: Rotator cuff testing negative for weakness with supraspinatus full can and empty can test, positive impingement testing. Strength: Normal strength 5/5 flex/ext, ext rot / int rot, grip, rotator cuff str testing. Neurovascular: Distally intact pulses, sensation to light touch   Skin:    General: Skin is warm and dry.     Findings: No erythema or rash.  Neurological:     Mental Status: He is alert and oriented to person, place, and time.  Psychiatric:        Mood and Affect: Mood normal.        Behavior: Behavior normal.        Thought Content: Thought content normal.     Comments: Well groomed, good eye contact, normal speech and thoughts   Results for orders placed or performed in visit on 06/30/21  HM DIABETES EYE EXAM  Result Value Ref Range   HM Diabetic Eye Exam No Retinopathy No Retinopathy      Assessment & Plan:   Problem List Items Addressed This Visit   None Visit Diagnoses     Chronic right shoulder pain    -  Primary   Relevant Medications   baclofen (LIORESAL) 10 MG tablet   Other Relevant Orders   Ambulatory referral to Sports Medicine   Primary osteoarthritis of both shoulders       Relevant Medications   baclofen (LIORESAL) 10 MG tablet   Other Relevant Orders   Ambulatory referral to Sports Medicine   Impingement syndrome of right shoulder       Relevant Medications   baclofen (LIORESAL) 10 MG tablet   Other Relevant Orders   Ambulatory referral to Sports Medicine       Subacute on Chronic Right shoulder impingement, bursitis, osteoarthritis, both shoulders but Right is worse significantly limiting his function at times. He tries to still play golf regularly. He has had Orthopedic eval with X-rays OA/DJD AC joint and dx with bursitis impingement, but steroid injection was  insufficient result for him by Isaac Weber 05/2021.   He prefers to avoid surgical approach if possible, has had prior rotator cuff surgery 1990s.  Referral sent to Dr Ellsworth Lennox Sports med  Requesting further evaluation and management, [facility] [place] if  available, treating now with NSAID + Muscle relaxant, may need dedicated PT or future MRI if need.  Orders Placed This Encounter  Procedures   Ambulatory referral to Sports Medicine    Referral Priority:   Routine    Referral Type:   Consultation    Number of Visits Requested:   1      Meds ordered this encounter  Medications   baclofen (LIORESAL) 10 MG tablet    Sig: Take 0.5-1 tablets (5-10 mg total) by mouth 2 (two) times daily as needed for muscle spasms.    Dispense:  60 each    Refill:  2      Follow up plan: Return if symptoms worsen or fail to improve, for Right shoulder pain.   Nobie Putnam, [facility] 08/24/2021, 1:45 PM

## 2021-08-24 NOTE — Patient Instructions (Addendum)
Thank you for coming to the office today.  [facility] [place], [place] [address] [phone]  Stay tuned for apt.  Start taking Baclofen (Lioresal) 10mg  (muscle relaxant) - start with half (cut) to one whole pill at night as needed for next 1-3 nights (may make you drowsy, caution with driving) see how it affects you, then if tolerated increase to one pill 2 to 3 times a day or (every 8 hours as needed)  Meloxicam with meal AS NEEDED can do 1-2 weeks ON and then OFF for next 1-2  Avoid use of ibuprofen aleve advil while on this.  Recommend to start taking Tylenol Extra Strength 500mg  tabs - take 1 to 2 tabs per dose (max 1000mg ) every 6-8 hours for pain (take regularly, don't skip a dose for next 7 days), max 24 hour daily dose is 6 tablets or 3000mg . In the future you can repeat the same everyday Tylenol course for 1-2 weeks at a time.     Please schedule a Follow-up Appointment to: Return if symptoms worsen or fail to improve, for Right shoulder pain.  If you have any other questions or concerns, please feel free to call the office or send a message through [place]. You may also schedule an earlier appointment if necessary.  Additionally, you may be receiving a survey about your experience at our office within a few days to 1 week by e-mail or mail. We value your feedback.  Nobie Putnam, DO [facility], [facility]  Range of Motion Shoulder Exercises  [place] with your good arm against a counter or table for support [facility] forward with a wide stance (make sure your body is comfortable) - Your painful shoulder should hang down and feel "heavy" - Gently move your painful arm in small circles "clockwise" for several turns - Switch to "counterclockwise" for several turns - Early on keep circles narrow and move slowly - Later in rehab, move in larger circles and faster  movement   Wall Crawl - Stand close (about 1-2 ft away) to a wall, facing it directly - Reach out with your arm of painful shoulder and place fingers (not palm) on wall - You should make contact with wall at your waist level - Slowly walk your fingers up the wall. Stay in contact with wall entire time, do not remove fingers - Keep walking fingers up wall until you reach shoulder level - You may feel tightening or mild discomfort, once you reach a height that causes pain or if you are already above your shoulder height then stop. Repeat from starting position. - Early on stand closer to wall, move fingers slowly, and stay at or below shoulder level - Later in rehab, stand farther away from wall (fingertips), move fingers quicker, go above shoulder level

## 2021-08-26 ENCOUNTER — Other Ambulatory Visit: Payer: Self-pay

## 2021-08-26 ENCOUNTER — Encounter: Payer: Self-pay | Admitting: Family Medicine

## 2021-08-26 ENCOUNTER — Ambulatory Visit (INDEPENDENT_AMBULATORY_CARE_PROVIDER_SITE_OTHER): Payer: PPO | Admitting: Family Medicine

## 2021-08-26 VITALS — BP 126/68 | HR 91 | Ht 69.0 in | Wt 168.0 lb

## 2021-08-26 DIAGNOSIS — M75101 Unspecified rotator cuff tear or rupture of right shoulder, not specified as traumatic: Secondary | ICD-10-CM | POA: Diagnosis not present

## 2021-08-26 DIAGNOSIS — M7521 Bicipital tendinitis, right shoulder: Secondary | ICD-10-CM

## 2021-08-26 MED ORDER — MELOXICAM 7.5 MG PO TABS: 7.5000 mg | ORAL_TABLET | Freq: Every day | ORAL | 0 refills | Status: DC | PRN

## 2021-08-26 NOTE — Progress Notes (Signed)
Primary Care / Sports Medicine Office Visit  Patient Information:  Patient ID: Isaac Weber, male DOB: 07-10-1946 Age: 76 y.o. MRN: [id number]   Isaac Weber is a pleasant 76 y.o. male presenting with the following:  Chief Complaint  Patient presents with   New Patient (Initial Visit)   Shoulder Pain    Right, chronic; x5-6 months; no known injury or trauma; history of rotator cuff surgery bilaterally in the 1990s; X-Ray and cortisone injection 06/03/2021 with pain relief for 1 week via [place]; ambidextrous; 8/10 pain    Vitals:   08/26/21 0925  BP: 126/68  Pulse: 91  SpO2: 99%   Vitals:   08/26/21 0925  Weight: 168 lb (76.2 kg)  Height: 5\' 9"  (1.753 m)   Body mass index is 24.81 kg/m.  No results found.   Independent interpretation of notes and tests performed by another provider:   None  Procedures performed:   None  Pertinent History, Exam, Impression, and Recommendations:   Biceps tendinitis, right Ambidextrous patient presenting with acute on chronic right shoulder pain, referred by his PCP Dr. Parks Ranger.  This episode has been ongoing for roughly 5-6 months, was seen and evaluated by outside orthopedic group with right subacromial bursal injection with cortisone performed on 06/03/2021.  This unfortunately only provided roughly 1 week of limited relief.  He continues to note pain, localizes this anteriorly without radiation, aggravated by lifting activities, putting his coat on, reaching out.  Denies any neck pain, no paresthesias, is noting weakness secondary to pain.  Of note he does have a history of right rotator cuff shoulder repair from 1996.  His examination shows limited range of motion with external rotation, abduction, and internal rotation/extension, this appears symmetric with the contralateral shoulder.  He has mild tenderness in the subacromial space, maximal tenderness that recreates his pain at the bicipital groove, isolated  RC testing reveals 4/5 strength with external rotation, mild pain that is symmetric, 5/5 painless internal rotation, and 4-/5 strength with isolated supraspinatus testing on the right that is painful, contralateral 4+/5 strength, painless.  Speeds and Yergason's are positive and recreate patient stated pain, negative Neer's, equivocal Hawkins noted.  O'Brien's equivocal.  Patient's constellation of findings show primary focality to the right long head biceps tendon, he does have secondary supraspinatus related impingement with findings most likely mitigated by his recent subacromial bursa injection.  Advised patient of the same as well as treatment strategies, he will utilize sporadic dosing of meloxicam, ice regimen, and close follow-up for reevaluation and low threshold for biceps tendon sheath ultrasound-guided injection.  I also placed a referral for physical therapy for more definitive long-term management once symptoms are adequately controlled.  Chronic issue, uncontrolled, Rx management  Supraspinatus syndrome, right Ambidextrous patient presenting with acute on chronic right shoulder pain in the setting of prior rotator cuff repair surgery.  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  He did recently have a subacromial corticosteroid injection through outside orthopedic group on 06/03/2021 which provided roughly 1 week of limited symptom control.  Examination does show focality to the supraspinatus though this is secondary to his biceps tendon. See additional assessment(s) for plan details.  His rotator cuff does appear to be symptomatic, though limited findings are most consistent with his recent subacromial injection.  Plan for sporadic meloxicam, ice regimen, and formal physical therapy to address this issue.   Orders & Medications Meds ordered  this encounter  Medications   meloxicam (MOBIC) 7.5 MG tablet    Sig: Take 1 tablet  (7.5 mg total) by mouth daily as needed.    Dispense:  30 tablet    Refill:  0   Orders Placed This Encounter  Procedures   Ambulatory referral to Physical Therapy     Return in about 1 week (around 09/02/2021).     [name], MD   Primary Care Sports Medicine [place]

## 2021-08-26 NOTE — Assessment & Plan Note (Signed)
Ambidextrous patient presenting with acute on chronic right shoulder pain in the setting of prior rotator cuff repair surgery.  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  He did recently have a subacromial corticosteroid injection through outside orthopedic group on 06/03/2021 which provided roughly 1 week of limited symptom control.  Examination does show focality to the supraspinatus though this is secondary to his biceps tendon. See additional assessment(s) for plan details.  His rotator cuff does appear to be symptomatic, though limited findings are most consistent with his recent subacromial injection.  Plan for sporadic meloxicam, ice regimen, and formal physical therapy to address this issue.

## 2021-08-26 NOTE — Assessment & Plan Note (Addendum)
Ambidextrous patient presenting with acute on chronic right shoulder pain, referred by his PCP Dr. Parks Ranger.  This episode has been ongoing for roughly 5-6 months, was seen and evaluated by outside orthopedic group with right subacromial bursal injection with cortisone performed on 06/03/2021.  This unfortunately only provided roughly 1 week of limited relief.  He continues to note pain, localizes this anteriorly without radiation, aggravated by lifting activities, putting his coat on, reaching out.  Denies any neck pain, no paresthesias, is noting weakness secondary to pain.  Of note he does have a history of right rotator cuff shoulder repair from 1996.  His examination shows limited range of motion with external rotation, abduction, and internal rotation/extension, this appears symmetric with the contralateral shoulder.  He has mild tenderness in the subacromial space, maximal tenderness that recreates his pain at the bicipital groove, isolated RC testing reveals 4/5 strength with external rotation, mild pain that is symmetric, 5/5 painless internal rotation, and 4-/5 strength with isolated supraspinatus testing on the right that is painful, contralateral 4+/5 strength, painless.  Speeds and Yergason's are positive and recreate patient stated pain, negative Neer's, equivocal Hawkins noted.  O'Brien's equivocal.  Patient's constellation of findings show primary focality to the right long head biceps tendon, he does have secondary supraspinatus related impingement with findings most likely mitigated by his recent subacromial bursa injection.  Advised patient of the same as well as treatment strategies, he will utilize sporadic dosing of meloxicam, ice regimen, and close follow-up for reevaluation and low threshold for biceps tendon sheath ultrasound-guided injection.  I also placed a referral for physical therapy for more definitive long-term management once symptoms are adequately controlled.  Chronic  issue, uncontrolled, Rx management

## 2021-08-26 NOTE — Patient Instructions (Signed)
-   Referral coordinator will contact you to schedule physical therapy, can contact number below for expedited scheduling - Recommend regular icing at 20-minute intervals at least twice a day and as needed - Can continue meloxicam sparingly on an as-needed basis, take with food - Return for follow-up within 1 week - Contact us for any questions between now and then

## 2021-09-03 ENCOUNTER — Ambulatory Visit (INDEPENDENT_AMBULATORY_CARE_PROVIDER_SITE_OTHER): Payer: PPO | Admitting: Family Medicine

## 2021-09-03 ENCOUNTER — Encounter: Payer: Self-pay | Admitting: Family Medicine

## 2021-09-03 ENCOUNTER — Inpatient Hospital Stay: Payer: Self-pay | Admitting: Radiology

## 2021-09-03 ENCOUNTER — Other Ambulatory Visit: Payer: Self-pay

## 2021-09-03 VITALS — BP 122/82 | HR 106 | Ht 69.0 in | Wt 169.0 lb

## 2021-09-03 DIAGNOSIS — M7521 Bicipital tendinitis, right shoulder: Secondary | ICD-10-CM

## 2021-09-03 DIAGNOSIS — M75101 Unspecified rotator cuff tear or rupture of right shoulder, not specified as traumatic: Secondary | ICD-10-CM

## 2021-09-03 MED ORDER — TRIAMCINOLONE ACETONIDE 40 MG/ML IJ SUSP
80.0000 mg | Freq: Once | INTRAMUSCULAR | Status: AC
  Administered 2021-09-03: 80 mg

## 2021-09-03 NOTE — Patient Instructions (Addendum)
You have just been given a cortisone injection to reduce pain and inflammation. After the injection you may notice immediate relief of pain as a result of the Lidocaine. It is important to rest the area of the injection for 24 to 48 hours after the injection. There is a possibility of some temporary increased discomfort and swelling for up to 72 hours until the cortisone begins to work. If you do have pain, simply rest the joint and use ice. If you can tolerate over the counter medications, you can try Tylenol, Aleve, or Advil for added relief per package instructions. -Transition to as needed dosing of meloxicam until symptoms respond to cortisone - Start physical therapy once scheduled (contact number below for expedited scheduling) - Contact our office for any lingering symptoms at the 2-6-week mark - Follow-up as needed  [facility]:  [place]:  [place]: [phone]

## 2021-09-03 NOTE — Assessment & Plan Note (Signed)
Isaac Weber presents for follow-up to acute on chronic right shoulder pain, at last visit on 08/26/2021 he was advised meloxicam regimen and close follow-up.  He does note that meloxicam daily allow much better sleep, slight improvement in pain, though he still rather symptomatic.  Examination today reveals persistent and maximal focality of the biceps tendon, isolated supraspinatus testing is positive for pain without weakness.  Given his persistent symptoms, we discussed treatment strategies and he did elect to proceed with ultrasound-guided biceps tendon sheath cortisone injection.  Post care reviewed, from medication management standpoint he will transition to as needed dosing of meloxicam, and start physical therapy (call to schedule).  Advised patient to contact us at the 2-week mark if symptoms fail to respond to cortisone, alternatively after physical therapy at the 6-week mark for next steps such as advanced imaging.  He can otherwise return on an as-needed basis.  Chronic condition, uncontrolled, Rx management

## 2021-09-03 NOTE — Assessment & Plan Note (Signed)
Patient returns for follow-up to acute on chronic right shoulder pain in the setting of prior rotator cuff repair surgery, at last visit on 08/26/2021, focality to the biceps tendon supraspinatus were noted.  He was advised meloxicam and close follow-up.  He notes mild improvement with meloxicam though he is still rather symptomatic, examination does show persistent focality to the supraspinatus without weakness.  We discussed additional treatment strategies and he did elect to proceed with ultrasound-guided subacromial corticosteroid injection.  Post care reviewed, Rx management with transition of meloxicam to as needed, and plan for start of physical therapy.  He can return on an as-needed basis with indications to contact us sooner if symptoms fail to adequately respond.  Chronic condition, uncontrolled, Rx management.

## 2021-09-03 NOTE — Progress Notes (Signed)
Primary Care / Sports Medicine Office Visit  Patient Information:  Patient ID: Isaac Weber, male DOB: April 11, 1946 Age: 76 y.o. MRN: [id number]   Isaac Weber is a pleasant 76 y.o. male presenting with the following:  Chief Complaint  Patient presents with   Biceps tendinitis, right   Supraspinatus syndrome, right    Vitals:   09/03/21 1554  BP: 122/82  Pulse: (!) 106  SpO2: 96%   Vitals:   09/03/21 1554  Weight: 169 lb (76.7 kg)  Height: 5\' 9"  (1.753 m)   Body mass index is 24.96 kg/m.  No results found.   Independent interpretation of notes and tests performed by another provider:   None  Procedures performed:   Procedure:  Injection of right biceps tendon sheath under ultrasound guidance. Ultrasound guidance utilized for out of plane approach, biceps tendon was visualized in the bicipital groove without associated effusion, hypoechoic response from injectate was noted Samsung HS60 device utilized with permanent recording / reporting. Verbal informed consent obtained and verified. Skin prepped in a sterile fashion. Ethyl chloride for topical local analgesia.  Completed without difficulty and tolerated well. Medication: triamcinolone acetonide 40 mg/mL suspension for injection 1 mL total and 2 mL lidocaine 1% without epinephrine utilized for needle placement anesthetic Advised to contact for fevers/chills, erythema, induration, drainage, or persistent bleeding.  Procedure:  Injection of right subacromial space under ultrasound guidance. Ultrasound guidance was utilized for in plane approach, morphologic irregularity of the supraspinatus is consistent with chronic tendinopathy, no discrete lesion noted, hypoechoic response of injectate was noted Samsung HS60 device utilized with permanent recording / reporting. Verbal informed consent obtained and verified. Skin prepped in a sterile fashion. Ethyl chloride for topical local analgesia.  Completed without  difficulty and tolerated well. Medication: triamcinolone acetonide 40 mg/mL suspension for injection 1 mL total and 2 mL lidocaine 1% without epinephrine utilized for needle placement anesthetic Advised to contact for fevers/chills, erythema, induration, drainage, or persistent bleeding.   Pertinent History, Exam, Impression, and Recommendations:   Biceps tendinitis, right Mr. Robart presents for follow-up to acute on chronic right shoulder pain, at last visit on 08/26/2021 he was advised meloxicam regimen and close follow-up.  He does note that meloxicam daily allow much better sleep, slight improvement in pain, though he still rather symptomatic.  Examination today reveals persistent and maximal focality of the biceps tendon, isolated supraspinatus testing is positive for pain without weakness.  Given his persistent symptoms, we discussed treatment strategies and he did elect to proceed with ultrasound-guided biceps tendon sheath cortisone injection.  Post care reviewed, from medication management standpoint he will transition to as needed dosing of meloxicam, and start physical therapy (call to schedule).  Advised patient to contact us at the 2-week mark if symptoms fail to respond to cortisone, alternatively after physical therapy at the 6-week mark for next steps such as advanced imaging.  He can otherwise return on an as-needed basis.  Chronic condition, uncontrolled, Rx management  Supraspinatus syndrome, right Patient returns for follow-up to acute on chronic right shoulder pain in the setting of prior rotator cuff repair surgery, at last visit on 08/26/2021, focality to the biceps tendon supraspinatus were noted.  He was advised meloxicam and close follow-up.  He notes mild improvement with meloxicam though he is still rather symptomatic, examination does show persistent focality to the supraspinatus without weakness.  We discussed additional treatment strategies and he did elect to proceed  with ultrasound-guided subacromial corticosteroid injection.  Post  care reviewed, Rx management with transition of meloxicam to as needed, and plan for start of physical therapy.  He can return on an as-needed basis with indications to contact us sooner if symptoms fail to adequately respond.  Chronic condition, uncontrolled, Rx management.   Orders & Medications Meds ordered this encounter  Medications   triamcinolone acetonide (KENALOG-40) injection 80 mg   Orders Placed This Encounter  Procedures   [place] LIMITED JOINT SPACE STRUCTURES UP RIGHT     No follow-ups on file.     [name], MD   Primary Care Sports Medicine [place]

## 2021-09-04 ENCOUNTER — Other Ambulatory Visit: Payer: Self-pay | Admitting: Family Medicine

## 2021-09-04 DIAGNOSIS — J4531 Mild persistent asthma with (acute) exacerbation: Secondary | ICD-10-CM

## 2021-09-04 MED ORDER — ALBUTEROL SULFATE HFA 108 (90 BASE) MCG/ACT IN AERS: 1.0000 | INHALATION_SPRAY | RESPIRATORY_TRACT | 2 refills | Status: DC | PRN

## 2021-09-04 NOTE — Telephone Encounter (Signed)
Requested Prescriptions  Pending Prescriptions Disp Refills   albuterol (VENTOLIN HFA) 108 (90 Base) MCG/ACT inhaler 1 each 2    Sig: Inhale 1-2 puffs into the lungs every 4 (four) hours as needed for wheezing or shortness of breath (cough).     Pulmonology:  [facility] Agonists 2 Passed - 09/04/2021  4:32 PM      Passed - Last BP in normal range    BP Readings from Last 1 Encounters:  09/03/21 122/82         Passed - Last Heart Rate in normal range    Pulse Readings from Last 1 Encounters:  09/03/21 (!) 106         Passed - Valid encounter within last 12 months    Recent Outpatient Visits          Yesterday Biceps tendinitis, right   [facility] [name], MD   1 week ago Biceps tendinitis, right   [facility] [name], MD   1 week ago Chronic right shoulder pain   [place], DO   2 months ago Annual physical exam   [facility] [place], Devonne Doughty, DO   8 months ago Type 2 diabetes mellitus with other specified complication, without long-term current use of insulin ([place])   [facility] [place], Devonne Doughty, DO      Future Appointments            In 3 months [place], Devonne Doughty, DO [facility], [place]   In 3 months Ralene Bathe, MD [place]   In 7 months  [facility], [facility]

## 2021-09-04 NOTE — Telephone Encounter (Signed)
[facility] requesting albuterol (VENTOLIN HFA) 108 (90 Base) MCG/ACT inhaler. Pharmacist states patient never filled script here.   [facility] - [place], [place] - [address] Phone:  [phone]  Fax:  [phone]

## 2021-09-08 ENCOUNTER — Encounter: Payer: Self-pay | Admitting: Family Medicine

## 2021-09-09 ENCOUNTER — Encounter: Payer: Self-pay | Admitting: Family Medicine

## 2021-09-09 NOTE — Telephone Encounter (Signed)
FYI

## 2021-09-14 ENCOUNTER — Encounter: Payer: Self-pay | Admitting: Family Medicine

## 2021-09-14 DIAGNOSIS — M9903 Segmental and somatic dysfunction of lumbar region: Secondary | ICD-10-CM | POA: Diagnosis not present

## 2021-09-14 DIAGNOSIS — M9905 Segmental and somatic dysfunction of pelvic region: Secondary | ICD-10-CM | POA: Diagnosis not present

## 2021-09-14 DIAGNOSIS — M5136 Other intervertebral disc degeneration, lumbar region: Secondary | ICD-10-CM | POA: Diagnosis not present

## 2021-09-14 DIAGNOSIS — M5432 Sciatica, left side: Secondary | ICD-10-CM | POA: Diagnosis not present

## 2021-09-14 DIAGNOSIS — E1169 Type 2 diabetes mellitus with other specified complication: Secondary | ICD-10-CM

## 2021-09-14 MED ORDER — ONETOUCH ULTRA VI STRP: ORAL_STRIP | 5 refills | Status: DC

## 2021-09-14 MED ORDER — ONETOUCH ULTRA 2 W/DEVICE KIT: PACK | 0 refills | Status: AC

## 2021-09-14 MED ORDER — ONETOUCH ULTRASOFT LANCETS MISC: 5 refills | Status: DC

## 2021-09-15 DIAGNOSIS — M9905 Segmental and somatic dysfunction of pelvic region: Secondary | ICD-10-CM | POA: Diagnosis not present

## 2021-09-15 DIAGNOSIS — M5432 Sciatica, left side: Secondary | ICD-10-CM | POA: Diagnosis not present

## 2021-09-15 DIAGNOSIS — M9903 Segmental and somatic dysfunction of lumbar region: Secondary | ICD-10-CM | POA: Diagnosis not present

## 2021-09-15 DIAGNOSIS — M5136 Other intervertebral disc degeneration, lumbar region: Secondary | ICD-10-CM | POA: Diagnosis not present

## 2021-09-18 DIAGNOSIS — M9905 Segmental and somatic dysfunction of pelvic region: Secondary | ICD-10-CM | POA: Diagnosis not present

## 2021-09-18 DIAGNOSIS — M5136 Other intervertebral disc degeneration, lumbar region: Secondary | ICD-10-CM | POA: Diagnosis not present

## 2021-09-18 DIAGNOSIS — M5432 Sciatica, left side: Secondary | ICD-10-CM | POA: Diagnosis not present

## 2021-09-18 DIAGNOSIS — M9903 Segmental and somatic dysfunction of lumbar region: Secondary | ICD-10-CM | POA: Diagnosis not present

## 2021-09-21 ENCOUNTER — Other Ambulatory Visit: Payer: Self-pay

## 2021-09-21 DIAGNOSIS — M9903 Segmental and somatic dysfunction of lumbar region: Secondary | ICD-10-CM | POA: Diagnosis not present

## 2021-09-21 DIAGNOSIS — M5136 Other intervertebral disc degeneration, lumbar region: Secondary | ICD-10-CM | POA: Diagnosis not present

## 2021-09-21 DIAGNOSIS — M25551 Pain in right hip: Secondary | ICD-10-CM

## 2021-09-21 DIAGNOSIS — M5432 Sciatica, left side: Secondary | ICD-10-CM | POA: Diagnosis not present

## 2021-09-21 DIAGNOSIS — M9905 Segmental and somatic dysfunction of pelvic region: Secondary | ICD-10-CM | POA: Diagnosis not present

## 2021-09-21 NOTE — Telephone Encounter (Signed)
Please schedule at patient's convenience.  He can get X-Rays the day prior or about an hour prior to appointment.

## 2021-09-21 NOTE — Telephone Encounter (Signed)
For your information  

## 2021-09-23 DIAGNOSIS — M5432 Sciatica, left side: Secondary | ICD-10-CM | POA: Diagnosis not present

## 2021-09-23 DIAGNOSIS — M9903 Segmental and somatic dysfunction of lumbar region: Secondary | ICD-10-CM | POA: Diagnosis not present

## 2021-09-23 DIAGNOSIS — M5136 Other intervertebral disc degeneration, lumbar region: Secondary | ICD-10-CM | POA: Diagnosis not present

## 2021-09-23 DIAGNOSIS — M9905 Segmental and somatic dysfunction of pelvic region: Secondary | ICD-10-CM | POA: Diagnosis not present

## 2021-09-24 ENCOUNTER — Ambulatory Visit
Admission: RE | Admit: 2021-09-24 | Discharge: 2021-09-24 | Disposition: A | Discharge status: Home / Self Care | Payer: PPO | Source: Ambulatory Visit | Attending: Family Medicine | Admitting: Family Medicine

## 2021-09-24 ENCOUNTER — Ambulatory Visit
Admission: RE | Admit: 2021-09-24 | Discharge: 2021-09-24 | Disposition: A | Discharge status: Home / Self Care | Payer: PPO | Attending: Family Medicine | Admitting: Family Medicine

## 2021-09-24 DIAGNOSIS — M25551 Pain in right hip: Secondary | ICD-10-CM | POA: Diagnosis not present

## 2021-09-28 ENCOUNTER — Encounter: Payer: Self-pay | Admitting: Family Medicine

## 2021-09-28 ENCOUNTER — Ambulatory Visit (INDEPENDENT_AMBULATORY_CARE_PROVIDER_SITE_OTHER): Payer: PPO | Admitting: Family Medicine

## 2021-09-28 ENCOUNTER — Other Ambulatory Visit: Payer: Self-pay

## 2021-09-28 VITALS — BP 152/74 | HR 110 | Ht 69.0 in | Wt 163.0 lb

## 2021-09-28 DIAGNOSIS — M249 Joint derangement, unspecified: Secondary | ICD-10-CM

## 2021-09-28 DIAGNOSIS — M47817 Spondylosis without myelopathy or radiculopathy, lumbosacral region: Secondary | ICD-10-CM | POA: Insufficient documentation

## 2021-09-28 MED ORDER — PREDNISONE 50 MG PO TABS: 50.0000 mg | ORAL_TABLET | Freq: Every day | ORAL | 0 refills | Status: DC

## 2021-09-28 NOTE — Patient Instructions (Addendum)
-   Start meloxicam 7.5 mg twice daily (take with food) ?-If still symptomatic over the next few days, escalate to prednisone ?

## 2021-09-28 NOTE — Assessment & Plan Note (Signed)
Presents with few week history of atraumatic posterolateral right hip pain with intermittent paresthesia to the right knee, at times to the right foot. Pain with weightbearing, at bed while supine. At onset was going on a 1+ mile hike with pain noted a few days after.  ? ?Examination shows focal and maximal tenderness at the right SI joint which recreates stated symptoms, secondary tenderness about the gluteus medius distribution insertionally and minimally at the right greater trochanter. Equivocal straight leg raise on right.  ? ?Given x-rays, history, and findings today, most consistent with focal right SI joint arthralgia in the setting of lumbosacral spondylosis with associated deep hip musculature deconditioning coupled with ramp up in activity. ? ?I have advised short-term restart of meloxicam at 15 mg, low threshold for escalation to 5 day prednisone burst if symptomatic by mid-week without improvement. Close follow-up and plan for ultrasound-guided injection at the right SI joint if still symptomatic. ? ?Independent interpretation radiographs, Rx management ?

## 2021-09-28 NOTE — Progress Notes (Signed)
?  ? ?  Primary Care / Sports Medicine Office Visit ? ?Patient Information:  ?Patient ID: Isaac Weber, male DOB: 12/13/1945 Age: 76 y.o. MRN: [id number]  ? ?Isaac Weber is a pleasant 76 y.o. male presenting with the following: ? ?Chief Complaint  ?Patient presents with  ? Hip Pain  ?  Right; x2 weeks a few days after hiking on 09/10/21; saw chiropractor on 09/14/21 and had some manipulation done, but was advised to follow-up with orthopedics; treatments include: rest, ice, heat, meloxicam, hydrocodone with minimal relief; describes pain as numbness, radiating to right knee, constant, achy; ambulating with cane today in office; 7/10 pain  ? ? ?Vitals:  ? 09/28/21 0835  ?BP: (!) 152/74  ?Pulse: (!) 110  ?SpO2: 98%  ? ?Vitals:  ? 09/28/21 0835  ?Weight: 163 lb (73.9 kg)  ?Height: '5\' 9"'$  (1.753 m)  ? ?Body mass index is 24.07 kg/m?. ? ?  ? ?Independent interpretation of notes and tests performed by another provider:  ? ?Independent interpretation of right hip/AP pelvis x-rays reveal mild-moderate degenerative changes at the femoroacetabular joint, right greater than left, there are cortical irregularities at the right pelvis and right greater trochanter consistent with degenerative changes/traction, SI joints bilaterally demonstrate sclerosis, limited visualization of the lumbar spine with multilevel asymmetric spondyloarthropathy. ? ?Procedures performed:  ? ?None ? ?Pertinent History, Exam, Impression, and Recommendations:  ? ?Derangement of right SI joint ?Presents with few week history of atraumatic posterolateral right hip pain with intermittent paresthesia to the right knee, at times to the right foot. Pain with weightbearing, at bed while supine. At onset was going on a 1+ mile hike with pain noted a few days after.  ? ?Examination shows focal and maximal tenderness at the right SI joint which recreates stated symptoms, secondary tenderness about the gluteus medius distribution insertionally and minimally at  the right greater trochanter. Equivocal straight leg raise on right.  ? ?Given x-rays, history, and findings today, most consistent with focal right SI joint arthralgia in the setting of lumbosacral spondylosis with associated deep hip musculature deconditioning coupled with ramp up in activity. ? ?I have advised short-term restart of meloxicam at 15 mg, low threshold for escalation to 5 day prednisone burst if symptomatic by mid-week without improvement. Close follow-up and plan for ultrasound-guided injection at the right SI joint if still symptomatic. ? ?Independent interpretation radiographs, Rx management ? ?Spondylosis of lumbosacral region without myelopathy or radiculopathy ?Patient with findings clinically and radiographically most consistent with underlying degenerative lumbosacral changes with associated deep hip deconditioning. See additional assessment(s) for plan details. ?  ? ?Orders & Medications ?Meds ordered this encounter  ?Medications  ? predniSONE (DELTASONE) 50 MG tablet  ?  Sig: Take 1 tablet (50 mg total) by mouth daily.  ?  Dispense:  5 tablet  ?  Refill:  0  ? ?No orders of the defined types were placed in this encounter. ?  ? ?Return in about 1 week (around 10/05/2021).  ?  ? ?[name], MD ? ? Primary Care Sports Medicine ?[facility] ?[place]  ? ?

## 2021-09-28 NOTE — Assessment & Plan Note (Signed)
Patient with findings clinically and radiographically most consistent with underlying degenerative lumbosacral changes with associated deep hip deconditioning. See additional assessment(s) for plan details. ?

## 2021-09-30 ENCOUNTER — Ambulatory Visit: Payer: Self-pay | Admitting: Urology

## 2021-10-05 ENCOUNTER — Ambulatory Visit (INDEPENDENT_AMBULATORY_CARE_PROVIDER_SITE_OTHER): Payer: PPO | Admitting: Family Medicine

## 2021-10-05 ENCOUNTER — Encounter: Payer: Self-pay | Admitting: Family Medicine

## 2021-10-05 ENCOUNTER — Inpatient Hospital Stay (INDEPENDENT_AMBULATORY_CARE_PROVIDER_SITE_OTHER): Payer: PPO | Admitting: Radiology

## 2021-10-05 ENCOUNTER — Other Ambulatory Visit: Payer: Self-pay

## 2021-10-05 VITALS — BP 128/76 | HR 110 | Ht 69.0 in | Wt 165.0 lb

## 2021-10-05 DIAGNOSIS — M249 Joint derangement, unspecified: Secondary | ICD-10-CM

## 2021-10-05 DIAGNOSIS — Z87891 Personal history of nicotine dependence: Secondary | ICD-10-CM | POA: Diagnosis not present

## 2021-10-05 DIAGNOSIS — J342 Deviated nasal septum: Secondary | ICD-10-CM | POA: Diagnosis not present

## 2021-10-05 DIAGNOSIS — M4727 Other spondylosis with radiculopathy, lumbosacral region: Secondary | ICD-10-CM

## 2021-10-05 DIAGNOSIS — Z8709 Personal history of other diseases of the respiratory system: Secondary | ICD-10-CM | POA: Diagnosis not present

## 2021-10-05 DIAGNOSIS — R0982 Postnasal drip: Secondary | ICD-10-CM | POA: Diagnosis not present

## 2021-10-05 DIAGNOSIS — J3489 Other specified disorders of nose and nasal sinuses: Secondary | ICD-10-CM | POA: Diagnosis not present

## 2021-10-05 MED ORDER — TRIAMCINOLONE ACETONIDE 40 MG/ML IJ SUSP
40.0000 mg | Freq: Once | INTRAMUSCULAR | Status: AC
  Administered 2021-10-05: 40 mg via INTRAMUSCULAR

## 2021-10-05 MED ORDER — MELOXICAM 7.5 MG PO TABS: 7.5000 mg | ORAL_TABLET | Freq: Every day | ORAL | 0 refills | Status: DC | PRN

## 2021-10-05 NOTE — Assessment & Plan Note (Signed)
Isaac Weber presents for follow-up to both lateral right hip pain, ongoing for a few weeks, onset after a 1+ mile hike.  He does have intermittent right lower extremity paresthesias.  At his last visit on 09/28/2021 he was advised scheduled meloxicam, escalation to prednisone x5 days, and close follow-up. ? ?He states that while he noted mild improvement with meloxicam, he did initiate the prednisone which did provide further symptom control though he has noted mild recurrence since transitioning back to meloxicam. ? ?Examination today does reveal both positive seated straight leg raise on the right, benign left, and focal tenderness at the right SI joint.  I did discuss the interplay of the sacroiliac joint and lumbosacral spine, especially given his extensive spine history inclusive of surgery and his radicular features noted today on the right.  Given treatments to date, additional strategies reviewed and he did elect to proceed with ultrasound-guided right SI joint injection, post care reviewed, and timeline for response relayed.  He will transition to as needed meloxicam from a medication management standpoint.  Lastly, I have placed a referral to pain and spine for any recalcitrant symptomatology.  If tolerable from a symptom standpoint, he can start home exercises and a gentle and graded manner, these were provided to him today. ? ?Rx management ?

## 2021-10-05 NOTE — Progress Notes (Signed)
?  ? ?Primary Care / Sports Medicine Office Visit ? ?Patient Information:  ?Patient ID: Isaac Weber, male DOB: 02-Aug-1945 Age: 77 y.o. MRN: [id number]  ? ?Isaac Weber is a pleasant 76 y.o. male presenting with the following: ? ?Chief Complaint  ?Patient presents with  ? Hip Pain  ?  Right side   ? ? ?Vitals:  ? 10/05/21 1053  ?BP: 128/76  ?Pulse: (!) 110  ?SpO2: 97%  ? ?Vitals:  ? 10/05/21 1053  ?Weight: 165 lb (74.8 kg)  ?Height: '5\' 9"'$  (1.753 m)  ? ?Body mass index is 24.37 kg/m?. ? ?  ? ?Independent interpretation of notes and tests performed by another provider:  ? ?None ? ?Procedures performed:  ? ?Procedure:  Injection of right SI joint under ultrasound guidance. ?Ultrasound guidance utilized for in-plane injection to the right SI joint, no effusion noted ?Samsung HS60 device utilized with permanent recording / reporting. ?Verbal informed consent obtained and verified. ?Skin prepped in a sterile fashion. ?Ethyl chloride for topical local analgesia.  ?Completed without difficulty and tolerated well. ?Medication: triamcinolone acetonide 40 mg/mL suspension for injection 1 mL total and 2 mL lidocaine 1% without epinephrine utilized for needle placement anesthetic ?Advised to contact for fevers/chills, erythema, induration, drainage, or persistent bleeding. ? ? ?Pertinent History, Exam, Impression, and Recommendations:  ? ?Derangement of right SI joint ?Isaac Weber presents for follow-up to both lateral right hip pain, ongoing for a few weeks, onset after a 1+ mile hike.  He does have intermittent right lower extremity paresthesias.  At his last visit on 09/28/2021 he was advised scheduled meloxicam, escalation to prednisone x5 days, and close follow-up. ? ?He states that while he noted mild improvement with meloxicam, he did initiate the prednisone which did provide further symptom control though he has noted mild recurrence since transitioning back to meloxicam. ? ?Examination today does reveal both  positive seated straight leg raise on the right, benign left, and focal tenderness at the right SI joint.  I did discuss the interplay of the sacroiliac joint and lumbosacral spine, especially given his extensive spine history inclusive of surgery and his radicular features noted today on the right.  Given treatments to date, additional strategies reviewed and he did elect to proceed with ultrasound-guided right SI joint injection, post care reviewed, and timeline for response relayed.  He will transition to as needed meloxicam from a medication management standpoint.  Lastly, I have placed a referral to pain and spine for any recalcitrant symptomatology.  If tolerable from a symptom standpoint, he can start home exercises and a gentle and graded manner, these were provided to him today. ? ?Rx management ? ?Spondylosis of lumbosacral region without myelopathy or radiculopathy ?Spondylosis of lumbosacral spine with noted right-sided radiculopathy today in the setting of atraumatic posterolateral hip pain with focality to the right SI joint. See additional assessment(s) for plan details. ? ?Given his history of spine surgeries, findings today, a referral to pain and spine was placed for further evaluation and management address any recalcitrant symptomatology.  Additionally, meloxicam dose modification advised. ? ?Chronic condition, symptomatic, Rx management  ? ?Orders & Medications ?Meds ordered this encounter  ?Medications  ? meloxicam (MOBIC) 7.5 MG tablet  ?  Sig: Take 1-2 tablets (7.5-15 mg total) by mouth daily as needed.  ?  Dispense:  60 tablet  ?  Refill:  0  ? triamcinolone acetonide (KENALOG-40) injection 40 mg  ? ?Orders Placed This Encounter  ?Procedures  ? [place] LIMITED JOINT  SPACE STRUCTURES LOW RIGHT  ? Ambulatory referral to Anesthesiology  ?  ? ?Return if symptoms worsen or fail to improve.  ?  ? ?[name], MD ? ? Primary Care Sports Medicine ?[facility] ?[place]   ? ?

## 2021-10-05 NOTE — Assessment & Plan Note (Signed)
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 See additional assessment(s) for plan details. ? ?Given his history of spine surgeries, findings today, a referral to pain and spine was placed for further evaluation and management address any recalcitrant symptomatology.  Additionally, meloxicam dose modification advised. ? ?Chronic condition, symptomatic, Rx management ?

## 2021-10-05 NOTE — Patient Instructions (Signed)
You have just been given a cortisone injection to reduce pain and inflammation. After the injection you may notice immediate relief of pain as a result of the Lidocaine. It is important to rest the area of the injection for 24 to 48 hours after the injection. There is a possibility of some temporary increased discomfort and swelling for up to 72 hours until the cortisone begins to work. If you do have pain, simply rest the joint and use ice. If you can tolerate over the counter medications, you can try Tylenol, Aleve, or Advil for added relief per package instructions. ?- As above, relative rest x2 days and rest return normal activity ?- Can continue meloxicam up to twice a day on as-needed basis ?- Referral has been placed to pain and spine group, referral coordinator should contact you in regards to scheduling ?- Contact us in 2 weeks to provide a status update or for any questions between now and then ?

## 2021-10-06 DIAGNOSIS — R0982 Postnasal drip: Secondary | ICD-10-CM | POA: Insufficient documentation

## 2021-10-07 ENCOUNTER — Other Ambulatory Visit: Payer: Self-pay

## 2021-10-07 DIAGNOSIS — M7521 Bicipital tendinitis, right shoulder: Secondary | ICD-10-CM

## 2021-10-08 ENCOUNTER — Other Ambulatory Visit: Payer: Self-pay | Admitting: Family Medicine

## 2021-10-14 NOTE — Telephone Encounter (Signed)
Please review.  KP

## 2021-10-14 NOTE — Telephone Encounter (Signed)
Please check on status of referrals to pain & spine as well as PT ? ?Ask about any tolerance of low dose hydrocodone, tramadol, etc given his allergy list ? ?Ask if he is open to getting a MRI, if so please place MRI lumbar spine without contrast

## 2021-10-22 ENCOUNTER — Ambulatory Visit: Payer: PPO | Attending: Family Medicine | Admitting: Physical Therapy

## 2021-10-22 ENCOUNTER — Encounter: Payer: Self-pay | Admitting: Physical Therapy

## 2021-10-22 DIAGNOSIS — M249 Joint derangement, unspecified: Secondary | ICD-10-CM | POA: Diagnosis not present

## 2021-10-22 DIAGNOSIS — M6281 Muscle weakness (generalized): Secondary | ICD-10-CM | POA: Insufficient documentation

## 2021-10-22 DIAGNOSIS — M7521 Bicipital tendinitis, right shoulder: Secondary | ICD-10-CM | POA: Diagnosis not present

## 2021-10-22 DIAGNOSIS — M4727 Other spondylosis with radiculopathy, lumbosacral region: Secondary | ICD-10-CM | POA: Diagnosis not present

## 2021-10-22 DIAGNOSIS — M47817 Spondylosis without myelopathy or radiculopathy, lumbosacral region: Secondary | ICD-10-CM | POA: Insufficient documentation

## 2021-10-22 NOTE — Patient Instructions (Signed)
Access Code: [email or web address] ?URL: [email or web address] ?Date: 10/22/2021 ?Prepared by: Dorcas Carrow ? ?Exercises ?- Supine Bridge  - 1 x daily - 5 x weekly - 2 sets - 10 reps ?- Hooklying Clamshell with Resistance  - 1 x daily - 5 x weekly - 2 sets - 10 reps ?- Supine March with Resistance Band  - 1 x daily - 5 x weekly - 2 sets - 10 reps ?- Supine Straight Leg Raises  - 1 x daily - 5 x weekly - 2 sets - 10 reps ?

## 2021-10-23 DIAGNOSIS — H938X3 Other specified disorders of ear, bilateral: Secondary | ICD-10-CM | POA: Diagnosis not present

## 2021-10-23 DIAGNOSIS — Z87891 Personal history of nicotine dependence: Secondary | ICD-10-CM | POA: Diagnosis not present

## 2021-10-23 DIAGNOSIS — R918 Other nonspecific abnormal finding of lung field: Secondary | ICD-10-CM | POA: Diagnosis not present

## 2021-10-23 DIAGNOSIS — J324 Chronic pansinusitis: Secondary | ICD-10-CM | POA: Diagnosis not present

## 2021-10-23 DIAGNOSIS — J4540 Moderate persistent asthma, uncomplicated: Secondary | ICD-10-CM | POA: Diagnosis not present

## 2021-10-23 NOTE — Therapy (Signed)
?OUTPATIENT PHYSICAL THERAPY THORACOLUMBAR EVALUATION ? ? ?Patient Name: Isaac Weber ?MRN: [id number] ?DOB:1946-06-27, 76 y.o., male ?Today's Date: 10/23/2021 ? ? PT End of Session - 10/23/21 0914   ? ? Visit Number 1   ? Number of Visits 9   ? Date for PT Re-Evaluation 11/19/21   ? PT Start Time 1337   ? PT Stop Time 1435   ? PT Time Calculation (min) 58 min   ? Activity Tolerance Patient tolerated treatment well   ? Behavior During Therapy [facility] for tasks assessed/performed   ? ?  ?  ? ?  ? ? ?Past Medical History:  ?Diagnosis Date  ? Abnormal chest CT   ? Allergic rhinitis, unspecified   ? Anemia   ? Aortic atherosclerosis ([place])   ? BPH (benign prostatic hyperplasia)   ? COPD (chronic obstructive pulmonary disease) ([place])   ? Coronary artery disease   ? Cough variant asthma   ? Glaucoma   ? Hypertension   ? Other chronic pain   ? Pancreatic lesion   ? Pure hypercholesterolemia, unspecified   ? 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 R upper forearm, [facility]  ? 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 R medial lower pretibial, [facility]  ? 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 left medial lower leg above medial ankle; SCC/KA type. Tx: [facility]  ? 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 Right neck proximal mandible. WD SCC, ulcerated. [facility] 04/29/2020  ? ?Past Surgical History:  ?Procedure Laterality Date  ? BACK SURGERY    ? BUNIONECTOMY Left 2003  ? hammer toe as well  ? BUNIONECTOMY WITH HAMMERTOE RECONSTRUCTION Left 2013  ? repeat  ? CERVICAL DISC SURGERY    ? COLONOSCOPY WITH PROPOFOL N/A 04/30/2021  ? Procedure: COLONOSCOPY WITH PROPOFOL;  Surgeon: Isaac Bellows, MD;  Location: [facility] ENDOSCOPY;  Service: Gastroenterology;  Laterality: N/A;  ? GREEN LIGHT LASER TURP (TRANSURETHRAL RESECTION OF PROSTATE  2010  ? laser, shrink prostate  ? KNEE ARTHROSCOPY Right 1991  ? LEG SURGERY Left   ? BENIGN BONE TUMOR  ? [place] SURGERY  2010  ? discectomy  ? [place] SURGERY  2011  ? PROSTATE SURGERY  2002  ? shrink  prostate  ? ROTATOR CUFF REPAIR Left 1999  ? debride, remove bonespur  ? ROTATOR CUFF REPAIR Right 1996  ? ?Patient Active Problem List  ? Diagnosis Date Noted  ? Derangement of right SI joint 09/28/2021  ? Spondylosis of lumbosacral region without myelopathy or radiculopathy 09/28/2021  ? Biceps tendinitis, right 08/26/2021  ? 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 Deviated septum 07/28/2021  ? History of chronic sinusitis 07/28/2021  ? Chronic pansinusitis 05/21/2021  ? Rhinitis 02/04/2021  ? Abnormal findings on diagnostic imaging of lung 07/31/2020  ? Chronic cough 07/31/2020  ? Centrilobular emphysema ([place]) 07/31/2020  ? Healthcare maintenance 07/31/2020  ? Type 2 diabetes mellitus with other specified complication ([place]) 40/98/1191  ? Kidney cysts 01/11/2020  ? Pancreatic cyst 01/11/2020  ? Iron deficiency anemia 01/11/2020  ? Hyperlipidemia associated with type 2 diabetes mellitus ([place]) 01/11/2020  ? Primary osteoarthritis involving multiple joints 01/11/2020  ? BPH associated with nocturia 01/11/2020  ? Cough variant asthma 08/17/2019  ? Essential hypertension   ? ? ?PCP: Isaac Hauser, DO ? ?REFERRING PROVIDER: [name], MD ? ?REFERRING DIAG: Diagnosis M24.9 (ICD-10-CM) - Derangement of right SI joint M47.27 (ICD-10-CM) - Osteoarthritis of spine with radiculopathy, lumbosacral region  ? ?  THERAPY DIAG:  ?Derangement of right SI joint ? ?Spondylosis of lumbosacral region without myelopathy or radiculopathy ? ?Muscle weakness (generalized) ? ?ONSET DATE: 09/09/21 ? ?SUBJECTIVE:                                                                                                                                                                                          ? ?SUBJECTIVE STATEMENT: ?Pt. Referred to PT with R SI pain and radicular symptoms into LE. Pt. Is waiting for appt. With Dr. Holley Weber to discuss pain mgmt.  Pt. Reports 6 weeks of inactivity.  Pt. States the increase pain symptoms  started after completing a 2 mile hike with family.  Pt. States MD recommend pt. Limit activity due to pain but pt. Is hoping to return to exercise/ walking.  No recent falls.  Pt. States he had a misstep in December and fell (accident).  L side sleeper ?PERTINENT HISTORY:  ?Derangement of right SI joint ?Isaac Weber presents for follow-up to both lateral right hip pain, ongoing for a few weeks, onset after a 1+ mile hike.  He does have intermittent right lower extremity paresthesias.  At his last visit on 09/28/2021 he was advised scheduled meloxicam, escalation to prednisone x5 days, and close follow-up. ?  ?He states that while he noted mild improvement with meloxicam, he did initiate the prednisone which did provide further symptom control though he has noted mild recurrence since transitioning back to meloxicam. ?  ?Examination today does reveal both positive seated straight leg raise on the right, benign left, and focal tenderness at the right SI joint.  I did discuss the interplay of the sacroiliac joint and lumbosacral spine, especially given his extensive spine history inclusive of surgery and his radicular features noted today on the right.  Given treatments to date, additional strategies reviewed and he did elect to proceed with ultrasound-guided right SI joint injection, post care reviewed, and timeline for response relayed.  He will transition to as needed meloxicam from a medication management standpoint.  Lastly, I have placed a referral to pain and spine for any recalcitrant symptomatology.  If tolerable from a symptom standpoint, he can start home exercises and a gentle and graded manner, these were provided to him today. ? ?PAIN:  ?Are you having pain? Yes: NPRS scale: 4/10 ?Pain location: R SI/back ?Pain description: aching ?Aggravating factors: increase activity ?Relieving factors: rest ? ? ?PRECAUTIONS: None ? ?WEIGHT BEARING RESTRICTIONS No ? ?FALLS:  ?Has patient fallen in last 6 months? Yes.  Number of falls 1 ? ?LIVING ENVIRONMENT: ?Lives with: lives with their family and lives with their spouse ?Lives in: House/apartment ? ?  OCCUPATION: retired ? ?PLOF: Independent ? ?PATIENT GOALS Decrease pain/ return to exercise program and golf weekly.   ? ? ?OBJECTIVE:  ? ?DIAGNOSTIC FINDINGS:  ?IMPRESSION: R hip X-ray ?Mild bilateral femoroacetabular and sacroiliac osteoarthritis. ? ?PATIENT SURVEYS:  ?FOTO initial 39/ goal 45 ? ?SCREENING FOR RED FLAGS: ?Bowel or bladder incontinence: No ?Spinal tumors: No ?Cauda equina syndrome: No ?Compression fracture: No ?Abdominal aneurysm: No ? ?COGNITION: ? Overall cognitive status: Within functional limits for tasks assessed   ?  ?SENSATION: ?WFL ? ?POSTURE:  ?Slight forward head/ rounded shoulder posture.  ? ?PALPATION: ?Low thoracic/ lumbar paraspinal muscle tightness.  ? ?LUMBAR ROM:  ? ?Active  A/PROM  ?10/23/2021  ?Flexion 25% limited  ?Extension 50% limited  ?Right lateral flexion 25%  ?Left lateral flexion 25%  ?Right rotation 25%  ?Left rotation 25%  ? (Blank rows = not tested) ? ?LE ROM: ? ?Active  Right ?10/23/2021 Left ?10/23/2021  ?Hip flexion WNL WNL  ?Hip extension    ?Hip abduction WNL WNL  ?Hip adduction    ?Hip internal rotation [facility] WFL  ?Hip external rotation [facility] WFL  ?Knee flexion WNL WNL  ?Knee extension WNL WNL  ?Ankle dorsiflexion WNL WNL  ?Ankle plantarflexion WNL WNL  ?Ankle inversion WNL WNL  ?Ankle eversion WNL WNL  ? (Blank rows = not tested) ? ?LE MMT: ? ?MMT Right ?10/23/2021 Left ?10/23/2021  ?Hip flexion 4+ 4+  ?Hip extension 4+ 4+  ?Hip abduction 5 5  ?Hip adduction 5 5  ?Hip internal rotation 5 5  ?Hip external rotation 5 5  ?Knee flexion 5 5  ?Knee extension 5 5  ?Ankle dorsiflexion 5 5  ?Ankle plantarflexion 4+ 4+  ?Ankle inversion 5 5  ?Ankle eversion 5 5  ? (Blank rows = not tested) ? ?LUMBAR SPECIAL TESTS:  ?Straight leg raise test: Negative ? ?GAIT: ?WNL ? ? ? ?TODAY'S TREATMENT  ?See HEP ? ? ?PATIENT EDUCATION:  ?Education details:  Access Code: [email or web address] ?Person educated: Patient and Spouse ?Education method: Explanation, Demonstration, and Handouts ?Education comprehension: verbalized understanding and returned demonstration ? ? ?HOME EX

## 2021-10-26 NOTE — Telephone Encounter (Signed)
Reached out to referral to see if they can get him in sooner, waiting to hear back. -[place] ?

## 2021-10-26 NOTE — Telephone Encounter (Signed)
Duplicate

## 2021-10-26 NOTE — Telephone Encounter (Signed)
Please advise 

## 2021-10-27 ENCOUNTER — Encounter: Payer: Self-pay | Admitting: Student in an Organized Health Care Education/Training Program

## 2021-10-27 ENCOUNTER — Ambulatory Visit
Admission: RE | Admit: 2021-10-27 | Discharge: 2021-10-27 | Disposition: A | Discharge status: Home / Self Care | Payer: PPO | Source: Ambulatory Visit | Attending: Student in an Organized Health Care Education/Training Program | Admitting: Student in an Organized Health Care Education/Training Program

## 2021-10-27 ENCOUNTER — Ambulatory Visit: Payer: PPO | Admitting: Student in an Organized Health Care Education/Training Program

## 2021-10-27 VITALS — BP 154/94 | HR 115 | Temp 97.5°F | Ht 69.0 in | Wt 162.0 lb

## 2021-10-27 DIAGNOSIS — G8929 Other chronic pain: Secondary | ICD-10-CM

## 2021-10-27 DIAGNOSIS — M4316 Spondylolisthesis, lumbar region: Secondary | ICD-10-CM | POA: Insufficient documentation

## 2021-10-27 DIAGNOSIS — D509 Iron deficiency anemia, unspecified: Secondary | ICD-10-CM | POA: Diagnosis not present

## 2021-10-27 DIAGNOSIS — G894 Chronic pain syndrome: Secondary | ICD-10-CM | POA: Insufficient documentation

## 2021-10-27 DIAGNOSIS — E1169 Type 2 diabetes mellitus with other specified complication: Secondary | ICD-10-CM | POA: Insufficient documentation

## 2021-10-27 DIAGNOSIS — J45991 Cough variant asthma: Secondary | ICD-10-CM | POA: Diagnosis not present

## 2021-10-27 DIAGNOSIS — M25551 Pain in right hip: Secondary | ICD-10-CM | POA: Diagnosis not present

## 2021-10-27 DIAGNOSIS — Z9889 Other specified postprocedural states: Secondary | ICD-10-CM | POA: Insufficient documentation

## 2021-10-27 DIAGNOSIS — M961 Postlaminectomy syndrome, not elsewhere classified: Secondary | ICD-10-CM

## 2021-10-27 DIAGNOSIS — M4726 Other spondylosis with radiculopathy, lumbar region: Secondary | ICD-10-CM | POA: Diagnosis not present

## 2021-10-27 DIAGNOSIS — M47817 Spondylosis without myelopathy or radiculopathy, lumbosacral region: Secondary | ICD-10-CM | POA: Diagnosis not present

## 2021-10-27 DIAGNOSIS — E785 Hyperlipidemia, unspecified: Secondary | ICD-10-CM | POA: Diagnosis not present

## 2021-10-27 DIAGNOSIS — J432 Centrilobular emphysema: Secondary | ICD-10-CM | POA: Diagnosis not present

## 2021-10-27 DIAGNOSIS — M5416 Radiculopathy, lumbar region: Secondary | ICD-10-CM

## 2021-10-27 DIAGNOSIS — K862 Cyst of pancreas: Secondary | ICD-10-CM | POA: Insufficient documentation

## 2021-10-27 DIAGNOSIS — I10 Essential (primary) hypertension: Secondary | ICD-10-CM | POA: Diagnosis not present

## 2021-10-27 DIAGNOSIS — M48061 Spinal stenosis, lumbar region without neurogenic claudication: Secondary | ICD-10-CM | POA: Diagnosis not present

## 2021-10-27 DIAGNOSIS — M159 Polyosteoarthritis, unspecified: Secondary | ICD-10-CM | POA: Diagnosis not present

## 2021-10-27 DIAGNOSIS — N281 Cyst of kidney, acquired: Secondary | ICD-10-CM | POA: Insufficient documentation

## 2021-10-27 DIAGNOSIS — J324 Chronic pansinusitis: Secondary | ICD-10-CM | POA: Insufficient documentation

## 2021-10-27 DIAGNOSIS — M47816 Spondylosis without myelopathy or radiculopathy, lumbar region: Secondary | ICD-10-CM | POA: Diagnosis not present

## 2021-10-27 NOTE — Progress Notes (Signed)
Safety precautions to be maintained throughout the outpatient stay will include: orient to surroundings, keep bed in low position, maintain call bell within reach at all times, provide assistance with transfer out of bed and ambulation.  

## 2021-10-27 NOTE — Progress Notes (Signed)
Patient: Isaac Weber  Service Category: E/M  Provider: Gillis Santa, MD  ?DOB: [birth year]  DOS: 10/27/2021  Referring Provider: Nobie Putnam *  ?MRN: [id number]  Setting: Ambulatory outpatient  PCP: Olin Hauser, DO  ?Type: New Patient  Specialty: Interventional Pain Management    ?Location: Office  Delivery: Face-to-face    ? ?Primary Reason(s) for Visit: Encounter for initial evaluation of one or more chronic problems (new to examiner) potentially causing chronic pain, and posing a threat to normal musculoskeletal function. (Level of risk: High) ?CC: Hip Pain (right) and Pain (right) ? ?HPI  ?Isaac Weber is a 76 y.o. year old, male patient, who comes for the first time to our practice referred by Nobie Putnam * for our initial evaluation of his chronic pain. He has Essential hypertension; Cough variant asthma; Type 2 diabetes mellitus with other specified complication ([place]); Kidney cysts; Pancreatic cyst; Iron deficiency anemia; Hyperlipidemia associated with type 2 diabetes mellitus ([place]); Primary osteoarthritis involving multiple joints; BPH associated with nocturia; Abnormal findings on diagnostic imaging of lung; Chronic cough; Centrilobular emphysema ([place]); Healthcare maintenance; Rhinitis; Chronic pansinusitis; Deviated septum; History of chronic sinusitis; Biceps tendinitis, right; Supraspinatus syndrome, right; Derangement of right SI joint; Spondylosis of lumbosacral region without myelopathy or radiculopathy; Lumbar radiculopathy; Failed back surgical syndrome; History of lumbar surgery; and Chronic pain syndrome on their problem list. Today he comes in for evaluation of his Hip Pain (right) and Pain (right) ? ?Pain Assessment: ?Location: Right Hip (right) ?Radiating: pain radiaities down right side to his thigh ?Onset: More than a month ago ?Duration: Chronic pain ?Quality: Hervey Ard, Aching ?Severity: 8 /10 (subjective, self-reported pain score)  ?Effect on ADL: limits mt  daily activities ?Timing: Intermittent ?Modifying factors: laying down, elevating his right leg ?BP: (!) 154/94  HR: (!) 115 ? ?Onset and Duration: Sudden and Present less than 3 months ?Cause of pain: Unknown ?Severity: No change since onset, NAS-11 at its worse: 8/10, NAS-11 at its best: 2/10, NAS-11 now: 8/10, and NAS-11 on the average: 2/10 ?Timing: Not influenced by the time of the day ?Aggravating Factors: Walking ?Alleviating Factors: Resting and Sitting ?Associated Problems: Pain that does not allow patient to sleep ?Quality of Pain: Aching, Annoying, Deep, Sharp, and Shooting ?Previous Examinations or Tests: The patient denies exam ?Previous Treatments: Chiropractic manipulations, Physical Therapy, and Trigger point injections ? ?Isaac Weber is a pleasant 76 year old male who presents with a chief complaint of low back pain with radiation into his right buttock and right hip at times.  This started in 2018 after a mountain climbing incident.  He states that since then he has had increased lower back pain with occasional radiation into his right calf as well.  He has been seen and evaluated by Dr. Zigmund Daniel with sports medicine.  To work-up SI joint pathology/differential there was a right SI joint injection done under ultrasound guidance which unfortunately did not provide significant pain relief.  Patient is starting physical therapy this week.  He is currently on baclofen and Mobic.  He takes hydrocodone as needed very sparingly.  He has a history of COPD and asthma.  He is a type II diabetic not on insulin.  No history of anticoagulants or antiplatelets.  He was recently started on gabapentin 100 mg 3 times daily for chronic cough.  Of note he has a history of 1 cervical spine surgery and 2 prior lumbar spine surgeries, 1 in the 1990 followed by another 1 in 2010 by Dr. Ellene Route.  No hardware was  placed in any of his lumbar spine surgeries.  Patient states that when his pain radiates into his right calf, he  describes it as an electrical, burning, tingling sensation.  Patient was a former Company secretary. ? ?PDMP checked and reviewed. ? ?Meds  ? ?Current Outpatient Medications:  ?  albuterol (VENTOLIN HFA) 108 (90 Base) MCG/ACT inhaler, Inhale 1-2 puffs into the lungs every 4 (four) hours as needed for wheezing or shortness of breath (cough)., Disp: 1 each, Rfl: 2 ?  amLODipine (NORVASC) 5 MG tablet, Take 5 mg by mouth daily., Disp: , Rfl:  ?  baclofen (LIORESAL) 10 MG tablet, Take 0.5-1 tablets (5-10 mg total) by mouth 2 (two) times daily as needed for muscle spasms., Disp: 60 each, Rfl: 2 ?  Blood Glucose Monitoring Suppl (ONE TOUCH ULTRA 2) w/Device KIT, Use to check blood sugar up to 2 times daily, Disp: 1 kit, Rfl: 0 ?  Calcium Carb-Cholecalciferol (CALCIUM + D3 PO), Take 1 tablet by mouth daily., Disp: , Rfl:  ?  ferrous sulfate 325 (65 FE) MG tablet, Take 325 mg by mouth daily with breakfast., Disp: , Rfl:  ?  fluticasone (FLONASE) 50 MCG/ACT nasal spray, USE ONE SPRAY IN EACH NOSTRIL DAILY, Disp: 48 g, Rfl: 3 ?  Ginger, Zingiber officinalis, (GINGER PO), Take 1 each by mouth daily. Tea, Disp: , Rfl:  ?  Glucosamine-Chondroitin (GLUCOSAMINE CHONDR COMPLEX PO), Take 1 capsule by mouth 2 (two) times daily., Disp: , Rfl:  ?  hydrochlorothiazide (HYDRODIURIL) 12.5 MG tablet, TAKE ONE TABLET BY MOUTH ONCE DAILY, Disp: 90 tablet, Rfl: 0 ?  HYDROcodone-acetaminophen (NORCO/VICODIN) 5-325 MG tablet, Take 1 tablet by mouth as needed for moderate pain., Disp: , Rfl:  ?  ketoconazole (NIZORAL) 2 % shampoo, SHAMPOO INTO SCALP, LET SIT FOR 10 MINUTES THEN WASH OFF. USE 3 TIMES PER WEEK, Disp: 120 mL, Rfl: 3 ?  Lancets (ONETOUCH ULTRASOFT) lancets, Use to check blood sugar up to twice per day, Disp: 200 each, Rfl: 5 ?  loratadine (CLARITIN) 10 MG tablet, Take 10 mg by mouth daily., Disp: , Rfl:  ?  losartan (COZAAR) 50 MG tablet, TAKE ONE TABLET BY MOUTH ONCE DAILY, Disp: 90 tablet, Rfl: 0 ?  meloxicam (MOBIC) 7.5 MG tablet, Take 1-2  tablets (7.5-15 mg total) by mouth daily as needed., Disp: 60 tablet, Rfl: 0 ?  metFORMIN (GLUCOPHAGE-XR) 500 MG 24 hr tablet, TAKE ONE TABLET BY MOUTH EVERYDAY AT BEDTIME, Disp: 90 tablet, Rfl: 0 ?  Mometasone Furoate (ASMANEX HFA) 200 MCG/ACT AERO, Inhale 2 puffs into the lungs in the morning and at bedtime., Disp: 13 g, Rfl: 11 ?  Multiple Vitamin (MULTIVITAMIN WITH MINERALS) TABS tablet, Take 1 tablet by mouth daily., Disp: , Rfl:  ?  ONETOUCH ULTRA test strip, Use to check blood sugar up to twice per day, Disp: 200 each, Rfl: 5 ?  rosuvastatin (CRESTOR) 5 MG tablet, TAKE ONE TABLET BY MOUTH ONCE DAILY, Disp: 90 tablet, Rfl: 1 ?  tamsulosin (FLOMAX) 0.4 MG CAPS capsule, Take 1 capsule (0.4 mg total) by mouth daily., Disp: 90 capsule, Rfl: 3 ?  diclofenac Sodium (VOLTAREN) 1 % GEL, Apply topically as needed. (Patient not taking: Reported on 10/27/2021), Disp: , Rfl:  ?  predniSONE (DELTASONE) 50 MG tablet, Take 1 tablet (50 mg total) by mouth daily. (Patient not taking: Reported on 10/05/2021), Disp: 5 tablet, Rfl: 0 ? ?Imaging Review  ?Narrative ?Clinical Data: Low back pain extending into the left lower ?extremity since falling 12/28/2008. ? ?  MRI LUMBAR SPINE WITHOUT AND WITH CONTRAST ? ?Technique:  Multiplanar and multiecho pulse sequences of the lumbar ?spine were obtained without and with intravenous contrast. ? ?Contrast: 15 ml Multihance. ? ?Comparison: None available. ? ?Findings: Normal signal is present in the conus medullaris which ?terminates at L1.  Marrow signal is somewhat heterogeneous. ?Endplate edematous changes are noted at L5-S1.  There is some gas ?within the L5-S1 disc space and no significant fluid signal.  A ?remote wedge deformity is present at T12.  The vertebral body ?heights are otherwise maintained.  Slight retrolisthesis is present ?at L1-2 and at L2-3.  Anterolisthesis at L4-5 measures 7 mm. ?Slight retrolisthesis at L5-S1 measures 3 mm. There are bilateral ?lower pole exophytic renal  cysts.  The visualized abdominal ?contents are otherwise unremarkable.  Individual disc levels are as ?follows. ? ?The discs at T10-11 and T11-12 are within normal limits. ? ?T12-L1:  Mild disc bulging is pres

## 2021-10-28 ENCOUNTER — Encounter: Payer: Self-pay | Admitting: Physical Therapy

## 2021-10-28 ENCOUNTER — Ambulatory Visit: Payer: PPO | Attending: Family Medicine | Admitting: Physical Therapy

## 2021-10-28 DIAGNOSIS — M6281 Muscle weakness (generalized): Secondary | ICD-10-CM | POA: Diagnosis not present

## 2021-10-28 DIAGNOSIS — M249 Joint derangement, unspecified: Secondary | ICD-10-CM | POA: Insufficient documentation

## 2021-10-28 DIAGNOSIS — M47817 Spondylosis without myelopathy or radiculopathy, lumbosacral region: Secondary | ICD-10-CM | POA: Diagnosis not present

## 2021-10-28 NOTE — Therapy (Signed)
?OUTPATIENT PHYSICAL THERAPY THORACOLUMBAR EVALUATION ? ? ?Patient Name: Isaac [facility] ?MRN: [id number] ?DOB:21-Dec-1945, 76 y.o., male ?Today's Date: 10/28/2021 ? ? PT End of Session - 10/28/21 1252   ? ? Visit Number 2   ? Number of Visits 9   ? Date for PT Re-Evaluation 11/19/21   ? PT Start Time 1250   ? PT Stop Time 0923   ? PT Time Calculation (min) 51 min   ? Activity Tolerance Patient tolerated treatment well   ? Behavior During Therapy [facility] for tasks assessed/performed   ? ?  ?  ? ?  ? ? ?Past Medical History:  ?Diagnosis Date  ? Abnormal chest CT   ? Allergic rhinitis, unspecified   ? Anemia   ? Aortic atherosclerosis ([place])   ? BPH (benign prostatic hyperplasia)   ? COPD (chronic obstructive pulmonary disease) ([place])   ? Coronary artery disease   ? Cough variant asthma   ? Glaucoma   ? Hypertension   ? Other chronic pain   ? Pancreatic lesion   ? Pure hypercholesterolemia, unspecified   ? 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 R upper forearm, ED[facility]  ? 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 R medial lower pretibial, [facility]  ? 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 left medial lower leg above medial ankle; SCC/KA type. Tx: [facility]  ? 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 Right neck proximal mandible. WD SCC, ulcerated. [facility] 04/29/2020  ? ?Past Surgical History:  ?Procedure Laterality Date  ? BACK SURGERY    ? BUNIONECTOMY Left 2003  ? hammer toe as well  ? BUNIONECTOMY WITH HAMMERTOE RECONSTRUCTION Left 2013  ? repeat  ? CERVICAL DISC SURGERY    ? COLONOSCOPY WITH PROPOFOL N/A 04/30/2021  ? Procedure: COLONOSCOPY WITH PROPOFOL;  Surgeon: Jonathon Bellows, MD;  Location: [facility] ENDOSCOPY;  Service: Gastroenterology;  Laterality: N/A;  ? GREEN LIGHT LASER TURP (TRANSURETHRAL RESECTION OF PROSTATE  2010  ? laser, shrink prostate  ? KNEE ARTHROSCOPY Right 1991  ? LEG SURGERY Left   ? BENIGN BONE TUMOR  ? [place] SURGERY  2010  ? discectomy  ? [place] SURGERY  2011  ? PROSTATE SURGERY  2002  ? shrink  prostate  ? ROTATOR CUFF REPAIR Left 1999  ? debride, remove bonespur  ? ROTATOR CUFF REPAIR Right 1996  ? ?Patient Active Problem List  ? Diagnosis Date Noted  ? Lumbar radiculopathy 10/27/2021  ? 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 History of lumbar surgery 10/27/2021  ? Chronic pain syndrome 10/27/2021  ? Derangement of right SI joint 09/28/2021  ? Spondylosis of lumbosacral region without myelopathy or radiculopathy 09/28/2021  ? Biceps tendinitis, right 08/26/2021  ? 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 Deviated septum 07/28/2021  ? History of chronic sinusitis 07/28/2021  ? Chronic pansinusitis 05/21/2021  ? Rhinitis 02/04/2021  ? Abnormal findings on diagnostic imaging of lung 07/31/2020  ? Chronic cough 07/31/2020  ? Centrilobular emphysema ([place]) 07/31/2020  ? Healthcare maintenance 07/31/2020  ? Type 2 diabetes mellitus with other specified complication ([place]) 30/01/6225  ? Kidney cysts 01/11/2020  ? Pancreatic cyst 01/11/2020  ? Iron deficiency anemia 01/11/2020  ? Hyperlipidemia associated with type 2 diabetes mellitus ([place]) 01/11/2020  ? Primary osteoarthritis involving multiple joints 01/11/2020  ? BPH associated with nocturia 01/11/2020  ? Cough variant asthma 08/17/2019  ? Essential hypertension   ? ? ?PCP: Olin Hauser, DO ? ?REFERRING PROVIDER: [name],  MD ? ?REFERRING DIAG: Diagnosis M24.9 (ICD-10-CM) - Derangement of right SI joint M47.27 (ICD-10-CM) - Osteoarthritis of spine with radiculopathy, lumbosacral region  ? ?THERAPY DIAG:  ?Derangement of right SI joint ? ?Spondylosis of lumbosacral region without myelopathy or radiculopathy ? ?Muscle weakness (generalized) ? ?ONSET DATE: 09/09/21 ? ?SUBJECTIVE:                                                                                                                                                                                          ? ?SUBJECTIVE STATEMENT: ?Pt. Had an appt. With Dr. Holley Raring yesterday and had  an X-ray done (see imaging results).  Pt. Reports minimal back/ R hip discomfort today (0-1/10).  Pt. Reports R posterior knee hurts worse than back currently.   ? ? ?PERTINENT HISTORY:  ?Derangement of right SI joint ?[name] presents for follow-up to both lateral right hip pain, ongoing for a few weeks, onset after a 1+ mile hike.  He does have intermittent right lower extremity paresthesias.  At his last visit on 09/28/2021 he was advised scheduled meloxicam, escalation to prednisone x5 days, and close follow-up. ?  ?He states that while he noted mild improvement with meloxicam, he did initiate the prednisone which did provide further symptom control though he has noted mild recurrence since transitioning back to meloxicam. ?  ?Examination today does reveal both positive seated straight leg raise on the right, benign left, and focal tenderness at the right SI joint.  I did discuss the interplay of the sacroiliac joint and lumbosacral spine, especially given his extensive spine history inclusive of surgery and his radicular features noted today on the right.  Given treatments to date, additional strategies reviewed and he did elect to proceed with ultrasound-guided right SI joint injection, post care reviewed, and timeline for response relayed.  He will transition to as needed meloxicam from a medication management standpoint.  Lastly, I have placed a referral to pain and spine for any recalcitrant symptomatology.  If tolerable from a symptom standpoint, he can start home exercises and a gentle and graded manner, these were provided to him today. ? ?PAIN:  ?Are you having pain? Yes: NPRS scale: 1/10 ?Pain location: R SI/back ?Pain description: aching ?Aggravating factors: increase activity ?Relieving factors: rest ? ? ?PRECAUTIONS: None ? ?WEIGHT BEARING RESTRICTIONS No ? ?FALLS:  ?Has patient fallen in last 6 months? Yes. Number of falls 1 ? ?LIVING ENVIRONMENT: ?Lives with: lives with their family and lives  with their spouse ?Lives in: House/apartment ? ?OCCUPATION: retired ? ?PLOF: Independent ? ?PATIENT GOALS Decrease pain/ return to exercise program and golf weekly.   ? ? ?  OBJECTIVE:  ? ?DIAGNOSTIC FINDINGS:  ?IMPRESSION:  ? ?4/5:  ?1. Extensive multilevel lumbar spondylosis and facet hypertrophy, ?greatest at L4-5 and L5-S1. ?2. Retrolisthesis of L1 on L2 and L2 on L3, with grade 1 ?anterolisthesis of L4 on L5. No pars defects. ?3. No instability with flexion or extension. ? ? ?3/2:  ?R hip X-ray ?Mild bilateral femoroacetabular and sacroiliac osteoarthritis. ? ?PATIENT SURVEYS:  ?FOTO initial 39/ goal 35 ? ?SCREENING FOR RED FLAGS: ?Bowel or bladder incontinence: No ?Spinal tumors: No ?Cauda equina syndrome: No ?Compression fracture: No ?Abdominal aneurysm: No ? ?COGNITION: ? Overall cognitive status: Within functional limits for tasks assessed   ?  ?SENSATION: ?WFL ? ?POSTURE:  ?Slight forward head/ rounded shoulder posture.  ? ?PALPATION: ?Low thoracic/ lumbar paraspinal muscle tightness.  ? ?TODAY'S TREATMENT  ?10/28/21:  ?There.ex.:  Nustep L4 10 min. B UE/LE (consistent cadence/ no increase c/o pain.   ? ?Amb. In clinic with cuing for consistent arm swing/ step pattern/ heel strike (no increase pain).   ? ?Resisted gait in //-bars:  2BTB 5x all 4-planes with mirror feedback.  SBA for safety.  (Slight increase in R hip/knee discomfort with lateral walking).   ? ?Reviewed HEP (see handouts)- supine GTB hip abduction/ marching 20x each.  ? ?Supine TrA ex: dead bug/ B shoulder flexion/ bicycle kicks 10x each. ? ?Manual tx.:  Supine LE/lumbar generalized stretches (hamstring/ gastroc/ hip/ trunk rotn.)- 11 min.  ? ?Seated STM to lumbar paraspinals (no increase tenderness/ pain).   ? ? ?PATIENT EDUCATION:  ?Education details: Access Code: [email or web address] ?Person educated: Patient and Spouse ?Education method: Explanation, Demonstration, and Handouts ?Education comprehension: verbalized understanding and returned  demonstration ? ? ?HOME EXERCISE PROGRAM: ?Access Code: [email or web address] ?URL: [email or web address] ?Date: 10/22/2021 ?Prepared by: Dorcas Carrow ?  ?Exercises ?- Supine Bridge  - 1 x daily - 5 x weekly - 2

## 2021-10-30 ENCOUNTER — Encounter: Payer: Self-pay | Admitting: Physical Therapy

## 2021-10-30 ENCOUNTER — Ambulatory Visit: Payer: PPO | Admitting: Physical Therapy

## 2021-10-30 DIAGNOSIS — M6281 Muscle weakness (generalized): Secondary | ICD-10-CM

## 2021-10-30 DIAGNOSIS — M249 Joint derangement, unspecified: Secondary | ICD-10-CM | POA: Diagnosis not present

## 2021-10-30 DIAGNOSIS — M47817 Spondylosis without myelopathy or radiculopathy, lumbosacral region: Secondary | ICD-10-CM

## 2021-10-30 NOTE — Therapy (Signed)
?OUTPATIENT PHYSICAL THERAPY THORACOLUMBAR EVALUATION ? ? ?Patient Name: Isaac Weber ?MRN: [id number] ?DOB:1945-10-11, 76 y.o., male ?Today's Date: 10/31/2021 ? ? PT End of Session - 10/30/21 1155   ? ? Visit Number 3   ? Number of Visits 9   ? Date for PT Re-Evaluation 11/19/21   ? PT Start Time 1149   ? PT Stop Time 1660   ? PT Time Calculation (min) 56 min   ? Activity Tolerance Patient tolerated treatment well   ? Behavior During Therapy [facility] for tasks assessed/performed   ? ?  ?  ? ?  ? ? ?Past Medical History:  ?Diagnosis Date  ? Abnormal chest CT   ? Allergic rhinitis, unspecified   ? Anemia   ? Aortic atherosclerosis ([facility])   ? BPH (benign prostatic hyperplasia)   ? COPD (chronic obstructive pulmonary disease) ([facility])   ? Coronary artery disease   ? Cough variant asthma   ? Glaucoma   ? Hypertension   ? Other chronic pain   ? Pancreatic lesion   ? Pure hypercholesterolemia, unspecified   ? 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 R upper forearm, [facility]  ? 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 R medial lower pretibial, [facility]  ? 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 left medial lower leg above medial ankle; SCC/KA type. Tx: [facility]  ? 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 Right neck proximal mandible. WD SCC, ulcerated. [facility] 04/29/2020  ? ?Past Surgical History:  ?Procedure Laterality Date  ? BACK SURGERY    ? BUNIONECTOMY Left 2003  ? hammer toe as well  ? BUNIONECTOMY WITH HAMMERTOE RECONSTRUCTION Left 2013  ? repeat  ? CERVICAL DISC SURGERY    ? COLONOSCOPY WITH PROPOFOL N/A 04/30/2021  ? Procedure: COLONOSCOPY WITH PROPOFOL;  Surgeon: Isaac Bellows, MD;  Location: [facility];  Service: Gastroenterology;  Laterality: N/A;  ? GREEN LIGHT LASER TURP (TRANSURETHRAL RESECTION OF PROSTATE  2010  ? laser, shrink prostate  ? KNEE ARTHROSCOPY Right 1991  ? LEG SURGERY Left   ? BENIGN BONE TUMOR  ? [place] SURGERY  2010  ? discectomy  ? [place] SURGERY  2011  ? PROSTATE SURGERY  2002  ? shrink  prostate  ? ROTATOR CUFF REPAIR Left 1999  ? debride, remove bonespur  ? ROTATOR CUFF REPAIR Right 1996  ? ?Patient Active Problem List  ? Diagnosis Date Noted  ? Lumbar radiculopathy 10/27/2021  ? 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 History of lumbar surgery 10/27/2021  ? Chronic pain syndrome 10/27/2021  ? Derangement of right SI joint 09/28/2021  ? Spondylosis of lumbosacral region without myelopathy or radiculopathy 09/28/2021  ? Biceps tendinitis, right 08/26/2021  ? 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 Deviated septum 07/28/2021  ? History of chronic sinusitis 07/28/2021  ? Chronic pansinusitis 05/21/2021  ? Rhinitis 02/04/2021  ? Abnormal findings on diagnostic imaging of lung 07/31/2020  ? Chronic cough 07/31/2020  ? Centrilobular emphysema ([place]) 07/31/2020  ? Healthcare maintenance 07/31/2020  ? Type 2 diabetes mellitus with other specified complication ([place]) 63/07/6008  ? Kidney cysts 01/11/2020  ? Pancreatic cyst 01/11/2020  ? Iron deficiency anemia 01/11/2020  ? Hyperlipidemia associated with type 2 diabetes mellitus ([place]) 01/11/2020  ? Primary osteoarthritis involving multiple joints 01/11/2020  ? BPH associated with nocturia 01/11/2020  ? Cough variant asthma 08/17/2019  ? Essential hypertension   ? ? ?PCP: Isaac Hauser, DO ? ?REFERRING PROVIDER: [name],  MD ? ?REFERRING DIAG: Diagnosis M24.9 (ICD-10-CM) - Derangement of right SI joint M47.27 (ICD-10-CM) - Osteoarthritis of spine with radiculopathy, lumbosacral region  ? ?THERAPY DIAG:  ?Derangement of right SI joint ? ?Spondylosis of lumbosacral region without myelopathy or radiculopathy ? ?Muscle weakness (generalized) ? ?ONSET DATE: 09/09/21 ? ?SUBJECTIVE:                                                                                                                                                                                          ? ?SUBJECTIVE STATEMENT: ?Pt. Has MRI scheduled for next Thursday 11/05/21.   Pt. Reports no back pain prior to tx. Session but had a difficult time sleeping last night.  Pt. States prolonged standing in shower is difficult/ pain limiting to low back.   ? ? ?PERTINENT HISTORY:  ?Derangement of right SI joint ?[name] presents for follow-up to both lateral right hip pain, ongoing for a few weeks, onset after a 1+ mile hike.  He does have intermittent right lower extremity paresthesias.  At his last visit on 09/28/2021 he was advised scheduled meloxicam, escalation to prednisone x5 days, and close follow-up. ?  ?He states that while he noted mild improvement with meloxicam, he did initiate the prednisone which did provide further symptom control though he has noted mild recurrence since transitioning back to meloxicam. ?  ?Examination today does reveal both positive seated straight leg raise on the right, benign left, and focal tenderness at the right SI joint.  I did discuss the interplay of the sacroiliac joint and lumbosacral spine, especially given his extensive spine history inclusive of surgery and his radicular features noted today on the right.  Given treatments to date, additional strategies reviewed and he did elect to proceed with ultrasound-guided right SI joint injection, post care reviewed, and timeline for response relayed.  He will transition to as needed meloxicam from a medication management standpoint.  Lastly, I have placed a referral to pain and spine for any recalcitrant symptomatology.  If tolerable from a symptom standpoint, he can start home exercises and a gentle and graded manner, these were provided to him today. ? ?PAIN:  ?Are you having pain? Not currently ? ?PRECAUTIONS: None ? ?WEIGHT BEARING RESTRICTIONS No ? ?FALLS:  ?Has patient fallen in last 6 months? Yes. Number of falls 1 ? ?LIVING ENVIRONMENT: ?Lives with: lives with their family and lives with their spouse ?Lives in: House/apartment ? ?OCCUPATION: retired ? ?PLOF: Independent ? ?PATIENT GOALS Decrease  pain/ return to exercise program and golf weekly.   ? ? ?OBJECTIVE:  ? ?DIAGNOSTIC FINDINGS:  ?IMPRESSION:  ? ?4/5:  ?1. Extensive multilevel  lumbar spondylosis and facet hypertrophy, ?greatest at L4-5 and L5-S1. ?2. Retrolisthesis of L1 on L2 and L2 on L3, with grade 1 ?anterolisthesis of L4 on L5. No pars defects. ?3. No instability with flexion or extension. ? ? ?3/2:  ?R hip X-ray ?Mild bilateral femoroacetabular and sacroiliac osteoarthritis. ? ?PATIENT SURVEYS:  ?FOTO initial 39/ goal 19 ? ?SCREENING FOR RED FLAGS: ?Bowel or bladder incontinence: No ?Spinal tumors: No ?Cauda equina syndrome: No ?Compression fracture: No ?Abdominal aneurysm: No ? ?COGNITION: ? Overall cognitive status: Within functional limits for tasks assessed   ?  ?SENSATION: ?WFL ? ?POSTURE:  ?Slight forward head/ rounded shoulder posture.  ? ?PALPATION: ?Low thoracic/ lumbar paraspinal muscle tightness.  ? ?TODAY'S TREATMENT  ?10/30/21: ? ?There.ex.:  Nustep L4 10 min. B UE/LE (consistent cadence/ no increase c/o pain.   ? ?Amb. In clinic with cuing for consistent arm swing/ step pattern/ heel strike (no increase pain).   ? ?Progressed HEP with BTB (see handouts)- added partial squats/ supine trunk rotn./ standing scap. Retraction with BTB.  ? ?Supine TrA ex: dead bug/ B shoulder flexion/ bicycle kicks 10x each. ? ?Manual tx.:  Supine LE/lumbar generalized stretches (hamstring/ gastroc/ hip/ trunk rotn.). ? ?Seated STM to lumbar paraspinals (no increase tenderness/ pain).   ? ? ?PATIENT EDUCATION:  ?Education details: Access Code: [email or web address] ?Person educated: Patient and Spouse ?Education method: Explanation, Demonstration, and Handouts ?Education comprehension: verbalized understanding and returned demonstration ? ? ?HOME EXERCISE PROGRAM: ?Access Code: [email or web address] ?URL: [email or web address] ?Date: 10/22/2021 ?Prepared by: Dorcas Carrow ?  ?Exercises ?- Supine Bridge  - 1 x daily - 5 x weekly - 2 sets - 10 reps ?- Hooklying  Clamshell with Resistance  - 1 x daily - 5 x weekly - 2 sets - 10 reps ?- Supine March with Resistance Band  - 1 x daily - 5 x weekly - 2 sets - 10 reps ?- Supine Straight Leg Raises  - 1 x daily - 5 x weekly

## 2021-10-31 ENCOUNTER — Encounter: Payer: Self-pay | Admitting: Physical Therapy

## 2021-11-02 ENCOUNTER — Encounter: Payer: Self-pay | Admitting: Family Medicine

## 2021-11-03 ENCOUNTER — Other Ambulatory Visit: Payer: Self-pay

## 2021-11-03 ENCOUNTER — Encounter: Payer: Self-pay | Admitting: Family Medicine

## 2021-11-03 ENCOUNTER — Other Ambulatory Visit: Payer: Self-pay | Admitting: Family Medicine

## 2021-11-03 DIAGNOSIS — M4727 Other spondylosis with radiculopathy, lumbosacral region: Secondary | ICD-10-CM

## 2021-11-03 DIAGNOSIS — M249 Joint derangement, unspecified: Secondary | ICD-10-CM

## 2021-11-03 MED ORDER — IBUPROFEN 800 MG PO TABS: 800.0000 mg | ORAL_TABLET | Freq: Three times a day (TID) | ORAL | 0 refills | Status: DC

## 2021-11-03 NOTE — Telephone Encounter (Signed)
Please review.  KP

## 2021-11-03 NOTE — Telephone Encounter (Signed)
Requested medications are due for refill today.  no ? ?Requested medications are on the active medications list.  no ? ?Last refill. 10/05/2021 ? ?Future visit scheduled.   yes ? ?Notes to clinic.  Medication was discontinued 11/03/2021. ? ? ? ?Requested Prescriptions  ?Pending Prescriptions Disp Refills  ? meloxicam (MOBIC) 7.5 MG tablet [Pharmacy Med Name: MELOXICAM 7.5MG TABLETS] 60 tablet 0  ?  Sig: TAKE 1 TO 2 TABLETS(7.5 TO 15 MG) BY MOUTH DAILY AS NEEDED  ?  ? Analgesics:  COX2 Inhibitors Failed - 11/03/2021  3:10 AM  ?  ?  Failed - Manual Review: Labs are only required if the patient has taken medication for more than 8 weeks.  ?  ?  Passed - HGB in normal range and within 360 days  ?  Hemoglobin  ?Date Value Ref Range Status  ?06/15/2021 13.7 13.2 - 17.1 g/dL Final  ? ?HGB  ?Date Value Ref Range Status  ?09/16/2014 11.6 (L) 13.0 - 17.1 g/dL Final  ?  ?  ?  ?  Passed - Cr in normal range and within 360 days  ?  Creatinine  ?Date Value Ref Range Status  ?09/16/2014 0.9 0.7 - 1.3 mg/dL Final  ? ?Creat  ?Date Value Ref Range Status  ?06/15/2021 0.92 0.70 - 1.28 mg/dL Final  ?  ?  ?  ?  Passed - HCT in normal range and within 360 days  ?  HCT  ?Date Value Ref Range Status  ?06/15/2021 39.4 38.5 - 50.0 % Final  ?09/16/2014 33.6 (L) 38.4 - 49.9 % Final  ?  ?  ?  ?  Passed - AST in normal range and within 360 days  ?  AST  ?Date Value Ref Range Status  ?06/15/2021 15 10 - 35 U/L Final  ?09/16/2014 19 5 - 34 U/L Final  ?  ?  ?  ?  Passed - ALT in normal range and within 360 days  ?  ALT  ?Date Value Ref Range Status  ?06/15/2021 21 9 - 46 U/L Final  ?09/16/2014 18 0 - 55 U/L Final  ?  ?  ?  ?  Passed - eGFR is 30 or above and within 360 days  ?  GFR, Est African American  ?Date Value Ref Range Status  ?06/02/2020 102 > OR = 60 mL/min/1.4m Final  ? ?GFR, Est Non African American  ?Date Value Ref Range Status  ?06/02/2020 88 > OR = 60 mL/min/1.733mFinal  ? ?eGFR  ?Date Value Ref Range Status  ?06/15/2021 86 > OR = 60  mL/min/1.7393minal  ?  Comment:  ?  The eGFR is based on the CKD-EPI 2021 equation. To calculate  ?the new eGFR from a previous Creatinine or Cystatin C ?result, go to https://www.kidney.org/professionals/ ?kdoqi/gfr%5Fcalculator ?  ?  ?  ?  ?  Passed - Patient is not pregnant  ?  ?  Passed - Valid encounter within last 12 months  ?  Recent Outpatient Visits   ? ?      ? 4 weeks ago Derangement of right SI joint  [facility]  ? 1 month ago [facility] right SI joint  [facility]  ? 2 months [facility], right  [facility]  ? 2 months ago Biceps tendinitis, right  ? [facility]  ? 2 months ago Chronic  right shoulder pain  ? [place], DO  ? ?  ?  ?Future Appointments   ? ?        ? In 1 month Karamalegos, Devonne Doughty, DO [facility] [place]  ? In 1 month Ralene Bathe, MD [place]  ? In 1 month Brand Males, MD [facility]  ? In 5 months  [facility] [place]  ? ?  ? ?  ?  ?  ?  ?

## 2021-11-04 ENCOUNTER — Ambulatory Visit: Payer: PPO | Admitting: Physical Therapy

## 2021-11-04 ENCOUNTER — Encounter: Payer: Self-pay | Admitting: Physical Therapy

## 2021-11-04 DIAGNOSIS — M47817 Spondylosis without myelopathy or radiculopathy, lumbosacral region: Secondary | ICD-10-CM

## 2021-11-04 DIAGNOSIS — M249 Joint derangement, unspecified: Secondary | ICD-10-CM | POA: Diagnosis not present

## 2021-11-04 DIAGNOSIS — M6281 Muscle weakness (generalized): Secondary | ICD-10-CM

## 2021-11-04 NOTE — Therapy (Signed)
?OUTPATIENT PHYSICAL THERAPY THORACOLUMBAR TREATMENT ? ? ?Patient Name: Isaac Weber ?MRN: [id number] ?DOB:1945-09-04, 76 y.o., male ?Today's Date: 11/04/2021 ? ? PT End of Session - 11/04/21 1425   ? ? Visit Number 4   ? Number of Visits 9   ? Date for PT Re-Evaluation 11/19/21   ? PT Start Time 1425   ? PT Stop Time 7793   ? PT Time Calculation (min) 51 min   ? Activity Tolerance Patient tolerated treatment well   ? Behavior During Therapy [facility] for tasks assessed/performed   ? ?  ?  ? ?  ? ? ?Past Medical History:  ?Diagnosis Date  ? Abnormal chest CT   ? Allergic rhinitis, unspecified   ? Anemia   ? Aortic atherosclerosis ([place])   ? BPH (benign prostatic hyperplasia)   ? COPD (chronic obstructive pulmonary disease) ([place])   ? Coronary artery disease   ? Cough variant asthma   ? Glaucoma   ? Hypertension   ? Other chronic pain   ? Pancreatic lesion   ? Pure hypercholesterolemia, unspecified   ? 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 R upper forearm, [facility]  ? 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 R medial lower pretibial, [facility]  ? 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 left medial lower leg above medial ankle; SCC/KA type. Tx: [facility]  ? 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 Right neck proximal mandible. WD SCC, ulcerated. [facility] 04/29/2020  ? ?Past Surgical History:  ?Procedure Laterality Date  ? BACK SURGERY    ? BUNIONECTOMY Left 2003  ? hammer toe as well  ? BUNIONECTOMY WITH HAMMERTOE RECONSTRUCTION Left 2013  ? repeat  ? CERVICAL DISC SURGERY    ? COLONOSCOPY WITH PROPOFOL N/A 04/30/2021  ? Procedure: COLONOSCOPY WITH PROPOFOL;  Surgeon: Jonathon Bellows, MD;  Location: [facility] ENDOSCOPY;  Service: Gastroenterology;  Laterality: N/A;  ? GREEN LIGHT LASER TURP (TRANSURETHRAL RESECTION OF PROSTATE  2010  ? laser, shrink prostate  ? KNEE ARTHROSCOPY Right 1991  ? LEG SURGERY Left   ? BENIGN BONE TUMOR  ? [place] SURGERY  2010  ? discectomy  ? [place] SURGERY  2011  ? PROSTATE SURGERY  2002  ? shrink  prostate  ? ROTATOR CUFF REPAIR Left 1999  ? debride, remove bonespur  ? ROTATOR CUFF REPAIR Right 1996  ? ?Patient Active Problem List  ? Diagnosis Date Noted  ? Lumbar radiculopathy 10/27/2021  ? 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 History of lumbar surgery 10/27/2021  ? Chronic pain syndrome 10/27/2021  ? Derangement of right SI joint 09/28/2021  ? Spondylosis of lumbosacral region without myelopathy or radiculopathy 09/28/2021  ? Biceps tendinitis, right 08/26/2021  ? 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 Deviated septum 07/28/2021  ? History of chronic sinusitis 07/28/2021  ? Chronic pansinusitis 05/21/2021  ? Rhinitis 02/04/2021  ? Abnormal findings on diagnostic imaging of lung 07/31/2020  ? Chronic cough 07/31/2020  ? Centrilobular emphysema ([place]) 07/31/2020  ? Healthcare maintenance 07/31/2020  ? Type 2 diabetes mellitus with other specified complication ([place]) 90/30/0923  ? Kidney cysts 01/11/2020  ? Pancreatic cyst 01/11/2020  ? Iron deficiency anemia 01/11/2020  ? Hyperlipidemia associated with type 2 diabetes mellitus ([place]) 01/11/2020  ? Primary osteoarthritis involving multiple joints 01/11/2020  ? BPH associated with nocturia 01/11/2020  ? Cough variant asthma 08/17/2019  ? Essential hypertension   ? ? ?PCP: Olin Hauser, DO ? ?REFERRING PROVIDER: [name],  MD ? ?REFERRING DIAG: Diagnosis M24.9 (ICD-10-CM) - Derangement of right SI joint M47.27 (ICD-10-CM) - Osteoarthritis of spine with radiculopathy, lumbosacral region  ? ?THERAPY DIAG:  ?Derangement of right SI joint ? ?Spondylosis of lumbosacral region without myelopathy or radiculopathy ? ?Muscle weakness (generalized) ? ?ONSET DATE: 09/09/21 ? ?SUBJECTIVE:                                                                                                                                                                                          ? ?SUBJECTIVE STATEMENT: ?Pt. Has MRI tomorrow.  Pt. States R shoulder was  sore after last tx. But doing better today.  Pt. Reports R knee stiffness walking into PT/ riding Nustep.  Pt. Has been taking Gabapentin later at night with improved benefit.   ? ? ?PERTINENT HISTORY:  ?Derangement of right SI joint ?Isaac Weber presents for follow-up to both lateral right hip pain, ongoing for a few weeks, onset after a 1+ mile hike.  He does have intermittent right lower extremity paresthesias.  At his last visit on 09/28/2021 he was advised scheduled meloxicam, escalation to prednisone x5 days, and close follow-up. ?  ?He states that while he noted mild improvement with meloxicam, he did initiate the prednisone which did provide further symptom control though he has noted mild recurrence since transitioning back to meloxicam. ?  ?Examination today does reveal both positive seated straight leg raise on the right, benign left, and focal tenderness at the right SI joint.  I did discuss the interplay of the sacroiliac joint and lumbosacral spine, especially given his extensive spine history inclusive of surgery and his radicular features noted today on the right.  Given treatments to date, additional strategies reviewed and he did elect to proceed with ultrasound-guided right SI joint injection, post care reviewed, and timeline for response relayed.  He will transition to as needed meloxicam from a medication management standpoint.  Lastly, I have placed a referral to pain and spine for any recalcitrant symptomatology.  If tolerable from a symptom standpoint, he can start home exercises and a gentle and graded manner, these were provided to him today. ? ?PAIN:  ?Are you having pain? Not currently ? ?PRECAUTIONS: None ? ?WEIGHT BEARING RESTRICTIONS No ? ?FALLS:  ?Has patient fallen in last 6 months? Yes. Number of falls 1 ? ?LIVING ENVIRONMENT: ?Lives with: lives with their family and lives with their spouse ?Lives in: House/apartment ? ?OCCUPATION: retired ? ?PLOF: Independent ? ?PATIENT GOALS Decrease  pain/ return to exercise program and golf weekly.   ? ? ?OBJECTIVE:  ? ?DIAGNOSTIC FINDINGS:  ?IMPRESSION:  ? ?4/5:  ?1. Extensive  multilevel lumbar spondylosis and facet hypertrophy, ?greatest at L4-5 and L5-S1. ?2. Retrolisthesis of L1 on L2 and L2 on L3, with grade 1 ?anterolisthesis of L4 on L5. No pars defects. ?3. No instability with flexion or extension. ? ? ?3/2:  ?R hip X-ray ?Mild bilateral femoroacetabular and sacroiliac osteoarthritis. ? ?PATIENT SURVEYS:  ?FOTO initial 39/ goal 46 ? ?SCREENING FOR RED FLAGS: ?Bowel or bladder incontinence: No ?Spinal tumors: No ?Cauda equina syndrome: No ?Compression fracture: No ?Abdominal aneurysm: No ? ?COGNITION: ? Overall cognitive status: Within functional limits for tasks assessed   ?  ?SENSATION: ?WFL ? ?POSTURE:  ?Slight forward head/ rounded shoulder posture.  ? ?PALPATION: ?Low thoracic/ lumbar paraspinal muscle tightness.  ? ?TODAY'S TREATMENT  ?11/04/21: ? ?There.ex.: Nustep L4 10 min. B UE/LE (consistent cadence).   ? ?Walking around PT clinic with cuing to correct posture/ arm swing.  Slight increase in R anterior knee/shin with increase distance walked.  ? ?High marching/ hip abduction/ hip extension/ partial squats at //-bars with mirror feedback.  20x. Each.  No ankle weights.   ? ?Supine TrA ex.: knee to chest with ball/ trunk rotn. With ball/ hip abduction/ pelvic tilts/ bridging 20x.  2# B UE chest press/ dead bug 20x. ?Walking outside (varying surfaces)- increase R anterior knee/lower leg discomfort. ? ? ?Manual tx.:  Supine LE/lumbar generalized stretches (hamstring/ gastroc/ hip/ trunk rotn.). ? ?Seated STM to lumbar paraspinals/ seated trunk rotn. (No pain).   ? ?Ice to low back in seated posture at end of tx. Session.   ? ? ? ? ? ?10/30/21: ? ?There.ex.:  Nustep L4 10 min. B UE/LE (consistent cadence/ no increase c/o pain.   ? ?Amb. In clinic with cuing for consistent arm swing/ step pattern/ heel strike (no increase pain).   ? ?Progressed HEP  with BTB (see handouts)- added partial squats/ supine trunk rotn./ standing scap. Retraction with BTB.  ? ?Supine TrA ex: dead bug/ B shoulder flexion/ bicycle kicks 10x each. ? ?Manual tx.:  Supine LE/lu

## 2021-11-05 ENCOUNTER — Ambulatory Visit
Admission: RE | Admit: 2021-11-05 | Discharge: 2021-11-05 | Disposition: A | Discharge status: Home / Self Care | Payer: PPO | Source: Ambulatory Visit | Attending: Student in an Organized Health Care Education/Training Program | Admitting: Student in an Organized Health Care Education/Training Program

## 2021-11-05 DIAGNOSIS — G894 Chronic pain syndrome: Secondary | ICD-10-CM | POA: Insufficient documentation

## 2021-11-05 DIAGNOSIS — M545 Low back pain, unspecified: Secondary | ICD-10-CM | POA: Diagnosis not present

## 2021-11-05 DIAGNOSIS — G8929 Other chronic pain: Secondary | ICD-10-CM | POA: Insufficient documentation

## 2021-11-05 DIAGNOSIS — M5416 Radiculopathy, lumbar region: Secondary | ICD-10-CM | POA: Diagnosis not present

## 2021-11-05 DIAGNOSIS — Z9889 Other specified postprocedural states: Secondary | ICD-10-CM | POA: Insufficient documentation

## 2021-11-05 DIAGNOSIS — M4316 Spondylolisthesis, lumbar region: Secondary | ICD-10-CM | POA: Diagnosis not present

## 2021-11-05 DIAGNOSIS — M961 Postlaminectomy syndrome, not elsewhere classified: Secondary | ICD-10-CM | POA: Diagnosis not present

## 2021-11-06 ENCOUNTER — Ambulatory Visit: Payer: PPO | Admitting: Physical Therapy

## 2021-11-06 ENCOUNTER — Encounter: Payer: Self-pay | Admitting: Physical Therapy

## 2021-11-06 DIAGNOSIS — M249 Joint derangement, unspecified: Secondary | ICD-10-CM | POA: Diagnosis not present

## 2021-11-06 DIAGNOSIS — M6281 Muscle weakness (generalized): Secondary | ICD-10-CM

## 2021-11-06 DIAGNOSIS — M47817 Spondylosis without myelopathy or radiculopathy, lumbosacral region: Secondary | ICD-10-CM

## 2021-11-06 NOTE — Therapy (Signed)
?OUTPATIENT PHYSICAL THERAPY THORACOLUMBAR TREATMENT ? ? ?Patient Name: Isaac Weber ?MRN: [id number] ?DOB:[birth year], 76 y.o., male ?Today's Date: 11/07/2021 ? ? PT End of Session - 11/06/21 1200   ? ? Visit Number 5   ? Number of Visits 9   ? Date for PT Re-Evaluation 11/19/21   ? PT Start Time 1156   ? PT Stop Time 0349   ? PT Time Calculation (min) 59 min   ? Activity Tolerance Patient tolerated treatment well   ? Behavior During Therapy [facility] for tasks assessed/performed   ? ?  ?  ? ?  ? ? ?Past Medical History:  ?Diagnosis Date  ? Abnormal chest CT   ? Allergic rhinitis, unspecified   ? Anemia   ? Aortic atherosclerosis ([place])   ? BPH (benign prostatic hyperplasia)   ? COPD (chronic obstructive pulmonary disease) ([place])   ? Coronary artery disease   ? Cough variant asthma   ? Glaucoma   ? Hypertension   ? Other chronic pain   ? Pancreatic lesion   ? Pure hypercholesterolemia, unspecified   ? 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 R upper forearm, [facility]  ? 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 R medial lower pretibial, [facility]  ? 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 left medial lower leg above medial ankle; SCC/KA type. Tx: [facility]  ? 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 Right neck proximal mandible. WD SCC, ulcerated. [facility] 04/29/2020  ? ?Past Surgical History:  ?Procedure Laterality Date  ? BACK SURGERY    ? BUNIONECTOMY Left 2003  ? hammer toe as well  ? BUNIONECTOMY WITH HAMMERTOE RECONSTRUCTION Left 2013  ? repeat  ? CERVICAL DISC SURGERY    ? COLONOSCOPY WITH PROPOFOL N/A 04/30/2021  ? Procedure: COLONOSCOPY WITH PROPOFOL;  Surgeon: Jonathon Bellows, MD;  Location: [facility] ENDOSCOPY;  Service: Gastroenterology;  Laterality: N/A;  ? GREEN LIGHT LASER TURP (TRANSURETHRAL RESECTION OF PROSTATE  2010  ? laser, shrink prostate  ? KNEE ARTHROSCOPY Right 1991  ? LEG SURGERY Left   ? BENIGN BONE TUMOR  ? [place] SURGERY  2010  ? discectomy  ? [place] SURGERY  2011  ? PROSTATE SURGERY  2002  ? shrink  prostate  ? ROTATOR CUFF REPAIR Left 1999  ? debride, remove bonespur  ? ROTATOR CUFF REPAIR Right 1996  ? ?Patient Active Problem List  ? Diagnosis Date Noted  ? Lumbar radiculopathy 10/27/2021  ? 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 History of lumbar surgery 10/27/2021  ? Chronic pain syndrome 10/27/2021  ? Derangement of right SI joint 09/28/2021  ? Spondylosis of lumbosacral region without myelopathy or radiculopathy 09/28/2021  ? Biceps tendinitis, right 08/26/2021  ? 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 Deviated septum 07/28/2021  ? History of chronic sinusitis 07/28/2021  ? Chronic pansinusitis 05/21/2021  ? Rhinitis 02/04/2021  ? Abnormal findings on diagnostic imaging of lung 07/31/2020  ? Chronic cough 07/31/2020  ? Centrilobular emphysema ([place]) 07/31/2020  ? Healthcare maintenance 07/31/2020  ? Type 2 diabetes mellitus with other specified complication ([place]) 17/91/5056  ? Kidney cysts 01/11/2020  ? Pancreatic cyst 01/11/2020  ? Iron deficiency anemia 01/11/2020  ? Hyperlipidemia associated with type 2 diabetes mellitus ([place]) 01/11/2020  ? Primary osteoarthritis involving multiple joints 01/11/2020  ? BPH associated with nocturia 01/11/2020  ? Cough variant asthma 08/17/2019  ? Essential hypertension   ? ? ?PCP: Olin Hauser, DO ? ?REFERRING PROVIDER: [name],  MD ? ?REFERRING DIAG: Diagnosis M24.9 (ICD-10-CM) - Derangement of right SI joint M47.27 (ICD-10-CM) - Osteoarthritis of spine with radiculopathy, lumbosacral region  ? ?THERAPY DIAG:  ?Derangement of right SI joint ? ?Spondylosis of lumbosacral region without myelopathy or radiculopathy ? ?Muscle weakness (generalized) ? ?ONSET DATE: 09/09/21 ? ?SUBJECTIVE:                                                                                                                                                                                          ? ?SUBJECTIVE STATEMENT: ?Pt. Had MRI results (see handout).  Pt. Is waiting  to hear back from Dr. Holley Raring.  Pt. Concerned about spinal stenosis.  Pt. States he is occasionally using SPC to walk to bathroom at home for safety.  Pt. Had some dizziness yesterday but better today.   ? ? ?PERTINENT HISTORY:  ?Derangement of right SI joint ?Mr. Stitely presents for follow-up to both lateral right hip pain, ongoing for a few weeks, onset after a 1+ mile hike.  He does have intermittent right lower extremity paresthesias.  At his last visit on 09/28/2021 he was advised scheduled meloxicam, escalation to prednisone x5 days, and close follow-up. ?  ?He states that while he noted mild improvement with meloxicam, he did initiate the prednisone which did provide further symptom control though he has noted mild recurrence since transitioning back to meloxicam. ?  ?Examination today does reveal both positive seated straight leg raise on the right, benign left, and focal tenderness at the right SI joint.  I did discuss the interplay of the sacroiliac joint and lumbosacral spine, especially given his extensive spine history inclusive of surgery and his radicular features noted today on the right.  Given treatments to date, additional strategies reviewed and he did elect to proceed with ultrasound-guided right SI joint injection, post care reviewed, and timeline for response relayed.  He will transition to as needed meloxicam from a medication management standpoint.  Lastly, I have placed a referral to pain and spine for any recalcitrant symptomatology.  If tolerable from a symptom standpoint, he can start home exercises and a gentle and graded manner, these were provided to him today. ? ?PAIN:  ?Are you having pain? Not currently ? ?PRECAUTIONS: None ? ?WEIGHT BEARING RESTRICTIONS No ? ?FALLS:  ?Has patient fallen in last 6 months? Yes. Number of falls 1 ? ?LIVING ENVIRONMENT: ?Lives with: lives with their family and lives with their spouse ?Lives in: House/apartment ? ?OCCUPATION: retired ? ?PLOF:  Independent ? ?PATIENT GOALS Decrease pain/ return to exercise program and golf weekly.   ? ? ?OBJECTIVE:  ? ?DIAGNOSTIC FINDINGS:  ?4/13: ? ?  IMPRESSION: ?1. Grade 1 anterolisthesis of L4 on L5 with associated advanced ?facet arthropathy resulting in severe spinal canal stenosis with ?impingement of the cauda equina nerve roots and moderate to severe ?left and severe right neural foraminal stenosis, progressed since ?2010. ?2. Severe left and moderate right neural foraminal stenosis at ?L2-L3, progressed since 2010. ?3. Disc bulge at L5-S1 with possible impingement of the traversing ?right S1 nerve root. There is also narrowing of the right ?subarticular zone at L1-L2 with potential irritation of the ?traversing L2 nerve root. ?4. Grade 1 retrolisthesis of L1 on L2, L2 on L3, and L5 on S1, ?similar to 2010. There is mild levocurvature centered at L2. ?  ?  ?Electronically Signed ?  By: Valetta Mole M.D. ? ? ?IMPRESSION:  ? ?4/5:  ?1. Extensive multilevel lumbar spondylosis and facet hypertrophy, ?greatest at L4-5 and L5-S1. ?2. Retrolisthesis of L1 on L2 and L2 on L3, with grade 1 ?anterolisthesis of L4 on L5. No pars defects. ?3. No instability with flexion or extension. ? ? ?3/2:  ?R hip X-ray ?Mild bilateral femoroacetabular and sacroiliac osteoarthritis. ? ?PATIENT SURVEYS:  ?FOTO initial 39/ goal 97 ? ?SCREENING FOR RED FLAGS: ?Bowel or bladder incontinence: No ?Spinal tumors: No ?Cauda equina syndrome: No ?Compression fracture: No ?Abdominal aneurysm: No ? ?COGNITION: ? Overall cognitive status: Within functional limits for tasks assessed   ?  ?SENSATION: ?WFL ? ?POSTURE:  ?Slight forward head/ rounded shoulder posture.  ? ?PALPATION: ?Low thoracic/ lumbar paraspinal muscle tightness.  ? ?TODAY'S TREATMENT  ?11/06/21 ? ?There.ex.:  Nustep L5 10 min. B UE/LE (consistent cadence/ no increase c/o pain)- discussed MRI results. ? ?Amb. In clinic with cuing for consistent arm swing/ step pattern/ heel strike (no  increase pain)- several laps in gym after there.ex./ manual tx.   ? ?Reviewed HEP  ? ?Supine TrA ex: B shoulder flexion/ bridging/ hip abduction with BTB/ bridging with hip resisted hip abduction 10x each. ? ? ?[place]

## 2021-11-09 ENCOUNTER — Ambulatory Visit: Payer: PPO | Admitting: Physical Therapy

## 2021-11-09 ENCOUNTER — Encounter: Payer: Self-pay | Admitting: Physical Therapy

## 2021-11-09 DIAGNOSIS — M6281 Muscle weakness (generalized): Secondary | ICD-10-CM

## 2021-11-09 DIAGNOSIS — M249 Joint derangement, unspecified: Secondary | ICD-10-CM

## 2021-11-09 DIAGNOSIS — M47817 Spondylosis without myelopathy or radiculopathy, lumbosacral region: Secondary | ICD-10-CM

## 2021-11-09 NOTE — Therapy (Signed)
?OUTPATIENT PHYSICAL THERAPY THORACOLUMBAR TREATMENT ? ? ?Patient Name: Isaac Weber ?MRN: [id number] ?DOB:[birth year], 76 y.o., male ?Today's Date: 11/09/2021 ? ? PT End of Session - 11/09/21 1427   ? ? Visit Number 6   ? Number of Visits 9   ? Date for PT Re-Evaluation 11/19/21   ? PT Start Time 1427   ? PT Stop Time 7408   ? PT Time Calculation (min) 49 min   ? Activity Tolerance Patient tolerated treatment well   ? Behavior During Therapy [facility] for tasks assessed/performed   ? ?  ?  ? ?  ? ? ?Past Medical History:  ?Diagnosis Date  ? Abnormal chest CT   ? Allergic rhinitis, unspecified   ? Anemia   ? Aortic atherosclerosis ([place])   ? BPH (benign prostatic hyperplasia)   ? COPD (chronic obstructive pulmonary disease) ([place])   ? Coronary artery disease   ? Cough variant asthma   ? Glaucoma   ? Hypertension   ? Other chronic pain   ? Pancreatic lesion   ? Pure hypercholesterolemia, unspecified   ? 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 R upper forearm, EDC  ? 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 R medial lower pretibial, [facility]  ? 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 left medial lower leg above medial ankle; SCC/KA type. Tx: ED[facility]  ? 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 Right neck proximal mandible. WD SCC, ulcerated. [facility] 04/29/2020  ? ?Past Surgical History:  ?Procedure Laterality Date  ? BACK SURGERY    ? BUNIONECTOMY Left 2003  ? hammer toe as well  ? BUNIONECTOMY WITH HAMMERTOE RECONSTRUCTION Left 2013  ? repeat  ? CERVICAL DISC SURGERY    ? COLONOSCOPY WITH PROPOFOL N/A 04/30/2021  ? Procedure: COLONOSCOPY WITH PROPOFOL;  Surgeon: Isaac Bellows, MD;  Location: [facility] ENDOSCOPY;  Service: Gastroenterology;  Laterality: N/A;  ? GREEN LIGHT LASER TURP (TRANSURETHRAL RESECTION OF PROSTATE  2010  ? laser, shrink prostate  ? KNEE ARTHROSCOPY Right 1991  ? LEG SURGERY Left   ? BENIGN BONE TUMOR  ? [place] SURGERY  2010  ? discectomy  ? [place] SURGERY  2011  ? PROSTATE SURGERY  2002  ? shrink  prostate  ? ROTATOR CUFF REPAIR Left 1999  ? debride, remove bonespur  ? ROTATOR CUFF REPAIR Right 1996  ? ?Patient Active Problem List  ? Diagnosis Date Noted  ? Lumbar radiculopathy 10/27/2021  ? 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 History of lumbar surgery 10/27/2021  ? Chronic pain syndrome 10/27/2021  ? Derangement of right SI joint 09/28/2021  ? Spondylosis of lumbosacral region without myelopathy or radiculopathy 09/28/2021  ? Biceps tendinitis, right 08/26/2021  ? 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 Deviated septum 07/28/2021  ? History of chronic sinusitis 07/28/2021  ? Chronic pansinusitis 05/21/2021  ? Rhinitis 02/04/2021  ? Abnormal findings on diagnostic imaging of lung 07/31/2020  ? Chronic cough 07/31/2020  ? Centrilobular emphysema ([place]) 07/31/2020  ? Healthcare maintenance 07/31/2020  ? Type 2 diabetes mellitus with other specified complication ([place]) 14/48/1856  ? Kidney cysts 01/11/2020  ? Pancreatic cyst 01/11/2020  ? Iron deficiency anemia 01/11/2020  ? Hyperlipidemia associated with type 2 diabetes mellitus ([place]) 01/11/2020  ? Primary osteoarthritis involving multiple joints 01/11/2020  ? BPH associated with nocturia 01/11/2020  ? Cough variant asthma 08/17/2019  ? Essential hypertension   ? ? ?PCP: Isaac Hauser, DO ? ?REFERRING PROVIDER: [name],  MD ? ?REFERRING DIAG: Diagnosis M24.9 (ICD-10-CM) - Derangement of right SI joint M47.27 (ICD-10-CM) - Osteoarthritis of spine with radiculopathy, lumbosacral region  ? ?THERAPY DIAG:  ?Derangement of right SI joint ? ?Spondylosis of lumbosacral region without myelopathy or radiculopathy ? ?Muscle weakness (generalized) ? ?ONSET DATE: 09/09/21 ? ?SUBJECTIVE:                                                                                                                                                                                          ? ?SUBJECTIVE STATEMENT: ?Pt. Reports catching R toe/foot on [employer]w at [employer]  and fell to R knee (band aide in place).  Pt. States he wore wife's back brace and was able to vacuum 1 room at home.  No c/o back pain prior to tx. Session.   ? ? ?PERTINENT HISTORY:  ?Derangement of right SI joint ?[name] presents for follow-up to both lateral right hip pain, ongoing for a few weeks, onset after a 1+ mile hike.  He does have intermittent right lower extremity paresthesias.  At his last visit on 09/28/2021 he was advised scheduled meloxicam, escalation to prednisone x5 days, and close follow-up. ?  ?He states that while he noted mild improvement with meloxicam, he did initiate the prednisone which did provide further symptom control though he has noted mild recurrence since transitioning back to meloxicam. ?  ?Examination today does reveal both positive seated straight leg raise on the right, benign left, and focal tenderness at the right SI joint.  I did discuss the interplay of the sacroiliac joint and lumbosacral spine, especially given his extensive spine history inclusive of surgery and his radicular features noted today on the right.  Given treatments to date, additional strategies reviewed and he did elect to proceed with ultrasound-guided right SI joint injection, post care reviewed, and timeline for response relayed.  He will transition to as needed meloxicam from a medication management standpoint.  Lastly, I have placed a referral to pain and spine for any recalcitrant symptomatology.  If tolerable from a symptom standpoint, he can start home exercises and a gentle and graded manner, these were provided to him today. ? ?PAIN:  ?Are you having pain? Not currently ? ?PRECAUTIONS: None ? ?WEIGHT BEARING RESTRICTIONS No ? ?FALLS:  ?Has patient fallen in last 6 months? Yes. Number of falls 1 ? ?LIVING ENVIRONMENT: ?Lives with: lives with their family and lives with their spouse ?Lives in: House/apartment ? ?OCCUPATION: retired ? ?PLOF: Independent ? ?PATIENT GOALS Decrease pain/ return to  exercise program and golf weekly.   ? ? ?OBJECTIVE:   Evaluation date: 10/28/21 ? ?DIAGNOSTIC FINDINGS:  ?  4/13: ? ?IMPRESSION: ?1. Grade 1 anterolisthesis of L4 on L5 with associated advanced ?facet arthropathy resulting in severe spinal canal stenosis with ?impingement of the cauda equina nerve roots and moderate to severe ?left and severe right neural foraminal stenosis, progressed since ?2010. ?2. Severe left and moderate right neural foraminal stenosis at ?L2-L3, progressed since 2010. ?3. Disc bulge at L5-S1 with possible impingement of the traversing ?right S1 nerve root. There is also narrowing of the right ?subarticular zone at L1-L2 with potential irritation of the ?traversing L2 nerve root. ?4. Grade 1 retrolisthesis of L1 on L2, L2 on L3, and L5 on S1, ?similar to 2010. There is mild levocurvature centered at L2. ?  ?  ?Electronically Signed ?  By: Valetta Mole M.D. ? ? ?IMPRESSION:  ? ?4/5:  ?1. Extensive multilevel lumbar spondylosis and facet hypertrophy, ?greatest at L4-5 and L5-S1. ?2. Retrolisthesis of L1 on L2 and L2 on L3, with grade 1 ?anterolisthesis of L4 on L5. No pars defects. ?3. No instability with flexion or extension. ? ? ?3/2:  ?R hip X-ray ?Mild bilateral femoroacetabular and sacroiliac osteoarthritis. ? ?PATIENT SURVEYS:  ?FOTO initial 39/ goal 35 ? ?SCREENING FOR RED FLAGS: ?Bowel or bladder incontinence: No ?Spinal tumors: No ?Cauda equina syndrome: No ?Compression fracture: No ?Abdominal aneurysm: No ? ?COGNITION: ? Overall cognitive status: Within functional limits for tasks assessed   ?  ?SENSATION: ?WFL ? ?POSTURE:  ?Slight forward head/ rounded shoulder posture.  ? ?PALPATION: ?Low thoracic/ lumbar paraspinal muscle tightness.  ? ?TODAY'S TREATMENT  ?11/09/21: ? ?There.ex.: Nustep L5 10 min. B UE/LE (consistent cadence).   ? ?Walking around PT clinic and outside with cuing to correct posture/ arm swing.   ? ?Supine TrA ex.: knee to chest with ball/ trunk rotn. With ball/ bridging  with ball 20x each.   ? ?Walking outside (varying surfaces)- no radicular symptoms but reports of R lower leg discomfort. ? ? ?Manual tx.:  Supine LE/lumbar generalized stretches (hamstring/ gastroc/ hip/

## 2021-11-11 ENCOUNTER — Encounter: Payer: Self-pay | Admitting: Physical Therapy

## 2021-11-11 ENCOUNTER — Ambulatory Visit: Payer: PPO | Admitting: Physical Therapy

## 2021-11-11 DIAGNOSIS — M47817 Spondylosis without myelopathy or radiculopathy, lumbosacral region: Secondary | ICD-10-CM

## 2021-11-11 DIAGNOSIS — M249 Joint derangement, unspecified: Secondary | ICD-10-CM | POA: Diagnosis not present

## 2021-11-11 DIAGNOSIS — M6281 Muscle weakness (generalized): Secondary | ICD-10-CM

## 2021-11-11 NOTE — Therapy (Signed)
?OUTPATIENT PHYSICAL THERAPY THORACOLUMBAR TREATMENT ? ? ?Patient Name: Isaac Weber ?MRN: [id number] ?DOB:1945-09-23, 76 y.o., male ?Today's Date: 11/12/2021 ? ? PT End of Session - 11/11/21 1431   ? ? Visit Number 7   ? Number of Visits 9   ? Date for PT Re-Evaluation 11/19/21   ? PT Start Time 5681   ? PT Stop Time 2751   ? PT Time Calculation (min) 46 min   ? Activity Tolerance Patient tolerated treatment well   ? Behavior During Therapy [facility] for tasks assessed/performed   ? ?  ?  ? ?  ? ? ?Past Medical History:  ?Diagnosis Date  ? Abnormal chest CT   ? Allergic rhinitis, unspecified   ? Anemia   ? Aortic atherosclerosis ([place])   ? BPH (benign prostatic hyperplasia)   ? COPD (chronic obstructive pulmonary disease) ([place])   ? Coronary artery disease   ? Cough variant asthma   ? Glaucoma   ? Hypertension   ? Other chronic pain   ? Pancreatic lesion   ? Pure hypercholesterolemia, unspecified   ? 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 R upper forearm, [facility]  ? 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 R medial lower pretibial, [facility]  ? 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 left medial lower leg above medial ankle; SCC/KA type. Tx: [facility]  ? 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 Right neck proximal mandible. WD SCC, ulcerated. [facility] 04/29/2020  ? ?Past Surgical History:  ?Procedure Laterality Date  ? BACK SURGERY    ? BUNIONECTOMY Left 2003  ? hammer toe as well  ? BUNIONECTOMY WITH HAMMERTOE RECONSTRUCTION Left 2013  ? repeat  ? CERVICAL DISC SURGERY    ? COLONOSCOPY WITH PROPOFOL N/A 04/30/2021  ? Procedure: COLONOSCOPY WITH PROPOFOL;  Surgeon: Jonathon Bellows, MD;  Location: [facility] ENDOSCOPY;  Service: Gastroenterology;  Laterality: N/A;  ? GREEN LIGHT LASER TURP (TRANSURETHRAL RESECTION OF PROSTATE  2010  ? laser, shrink prostate  ? KNEE ARTHROSCOPY Right 1991  ? LEG SURGERY Left   ? BENIGN BONE TUMOR  ? [place] SURGERY  2010  ? discectomy  ? [place] SURGERY  2011  ? PROSTATE SURGERY  2002  ? shrink  prostate  ? ROTATOR CUFF REPAIR Left 1999  ? debride, remove bonespur  ? ROTATOR CUFF REPAIR Right 1996  ? ?Patient Active Problem List  ? Diagnosis Date Noted  ? Lumbar radiculopathy 10/27/2021  ? 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 History of lumbar surgery 10/27/2021  ? Chronic pain syndrome 10/27/2021  ? Derangement of right SI joint 09/28/2021  ? Spondylosis of lumbosacral region without myelopathy or radiculopathy 09/28/2021  ? Biceps tendinitis, right 08/26/2021  ? 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 Deviated septum 07/28/2021  ? History of chronic sinusitis 07/28/2021  ? Chronic pansinusitis 05/21/2021  ? Rhinitis 02/04/2021  ? Abnormal findings on diagnostic imaging of lung 07/31/2020  ? Chronic cough 07/31/2020  ? Centrilobular emphysema ([place]) 07/31/2020  ? Healthcare maintenance 07/31/2020  ? Type 2 diabetes mellitus with other specified complication ([place]) 70/07/7492  ? Kidney cysts 01/11/2020  ? Pancreatic cyst 01/11/2020  ? Iron deficiency anemia 01/11/2020  ? Hyperlipidemia associated with type 2 diabetes mellitus ([place]) 01/11/2020  ? Primary osteoarthritis involving multiple joints 01/11/2020  ? BPH associated with nocturia 01/11/2020  ? Cough variant asthma 08/17/2019  ? Essential hypertension   ? ? ?PCP: Olin Hauser, DO ? ?REFERRING PROVIDER: [name],  MD ? ?REFERRING DIAG: Diagnosis M24.9 (ICD-10-CM) - Derangement of right SI joint M47.27 (ICD-10-CM) - Osteoarthritis of spine with radiculopathy, lumbosacral region  ? ?THERAPY DIAG:  ?Derangement of right SI joint ? ?Spondylosis of lumbosacral region without myelopathy or radiculopathy ? ?Muscle weakness (generalized) ? ?ONSET DATE: 09/09/21 ? ?SUBJECTIVE:                                                                                                                                                                                          ? ?SUBJECTIVE STATEMENT: ?Pt. States he tried to sleep on R side 2 nights ago  and did well.  Pt. Had a difficult time falling asleep last night due to back pain.  Pt. Normally sleeps in prone position but limited last night.  Pt. Returns to Dr. Holley Raring next Tuesday for MRI results/discussion of POC.       ? ? ?PERTINENT HISTORY:  ?Derangement of right SI joint ?Mr. [name] presents for follow-up to both lateral right hip pain, ongoing for a few weeks, onset after a 1+ mile hike.  He does have intermittent right lower extremity paresthesias.  At his last visit on 09/28/2021 he was advised scheduled meloxicam, escalation to prednisone x5 days, and close follow-up. ?  ?He states that while he noted mild improvement with meloxicam, he did initiate the prednisone which did provide further symptom control though he has noted mild recurrence since transitioning back to meloxicam. ?  ?Examination today does reveal both positive seated straight leg raise on the right, benign left, and focal tenderness at the right SI joint.  I did discuss the interplay of the sacroiliac joint and lumbosacral spine, especially given his extensive spine history inclusive of surgery and his radicular features noted today on the right.  Given treatments to date, additional strategies reviewed and he did elect to proceed with ultrasound-guided right SI joint injection, post care reviewed, and timeline for response relayed.  He will transition to as needed meloxicam from a medication management standpoint.  Lastly, I have placed a referral to pain and spine for any recalcitrant symptomatology.  If tolerable from a symptom standpoint, he can start home exercises and a gentle and graded manner, these were provided to him today. ? ?PAIN:  ?Are you having pain? 4/10 R low back/ hip.  Pt. Reports R knee sore since fall at church.    ? ?PRECAUTIONS: None ? ?WEIGHT BEARING RESTRICTIONS No ? ?FALLS:  ?Has patient fallen in last 6 months? Yes. Number of falls 1 ? ?LIVING ENVIRONMENT: ?Lives with: lives with their family and lives with  their spouse ?Lives in: House/apartment ? ?OCCUPATION: retired ? ?  PLOF: Independent ? ?PATIENT GOALS Decrease pain/ return to exercise program and golf weekly.   ? ? ?OBJECTIVE:   Evaluation date: 10/28/21 ? ?DIAGNOSTIC FINDINGS:  ?4/13: ? ?IMPRESSION: ?1. Grade 1 anterolisthesis of L4 on L5 with associated advanced ?facet arthropathy resulting in severe spinal canal stenosis with ?impingement of the cauda equina nerve roots and moderate to severe ?left and severe right neural foraminal stenosis, progressed since ?2010. ?2. Severe left and moderate right neural foraminal stenosis at ?L2-L3, progressed since 2010. ?3. Disc bulge at L5-S1 with possible impingement of the traversing ?right S1 nerve root. There is also narrowing of the right ?subarticular zone at L1-L2 with potential irritation of the ?traversing L2 nerve root. ?4. Grade 1 retrolisthesis of L1 on L2, L2 on L3, and L5 on S1, ?similar to 2010. There is mild levocurvature centered at L2. ?  ?  ?Electronically Signed ?  By: Valetta Mole M.D. ? ? ?IMPRESSION:  ? ?4/5:  ?1. Extensive multilevel lumbar spondylosis and facet hypertrophy, ?greatest at L4-5 and L5-S1. ?2. Retrolisthesis of L1 on L2 and L2 on L3, with grade 1 ?anterolisthesis of L4 on L5. No pars defects. ?3. No instability with flexion or extension. ? ? ?3/2:  ?R hip X-ray ?Mild bilateral femoroacetabular and sacroiliac osteoarthritis. ? ?PATIENT SURVEYS:  ?FOTO initial 39/ goal 81 ? ?SCREENING FOR RED FLAGS: ?Bowel or bladder incontinence: No ?Spinal tumors: No ?Cauda equina syndrome: No ?Compression fracture: No ?Abdominal aneurysm: No ? ?COGNITION: ? Overall cognitive status: Within functional limits for tasks assessed   ?  ?SENSATION: ?WFL ? ?POSTURE:  ?Slight forward head/ rounded shoulder posture.  ? ?PALPATION: ?Low thoracic/ lumbar paraspinal muscle tightness.  ? ?TODAY'S TREATMENT  ?11/11/21 ? ?MH to low back prior to treatment in seated position.  PT discussed recent increase in low back  symptoms at night/ sleeping posture.   ? ?There.ex.:  Walking in //-bars with high marching/ posture correction 5 laps.   ? ?Nustep L5 10 min. B UE/LE (consistent cadence/ no increase c/o pain). ? ?Sta

## 2021-11-13 ENCOUNTER — Encounter: Payer: PPO | Admitting: Physical Therapy

## 2021-11-13 DIAGNOSIS — J301 Allergic rhinitis due to pollen: Secondary | ICD-10-CM | POA: Diagnosis not present

## 2021-11-13 DIAGNOSIS — J449 Chronic obstructive pulmonary disease, unspecified: Secondary | ICD-10-CM | POA: Diagnosis not present

## 2021-11-16 ENCOUNTER — Other Ambulatory Visit: Payer: Self-pay | Admitting: Family Medicine

## 2021-11-16 DIAGNOSIS — I10 Essential (primary) hypertension: Secondary | ICD-10-CM

## 2021-11-16 DIAGNOSIS — E1169 Type 2 diabetes mellitus with other specified complication: Secondary | ICD-10-CM

## 2021-11-17 ENCOUNTER — Encounter: Payer: Self-pay | Admitting: Student in an Organized Health Care Education/Training Program

## 2021-11-17 ENCOUNTER — Ambulatory Visit
Payer: PPO | Attending: Student in an Organized Health Care Education/Training Program | Admitting: Student in an Organized Health Care Education/Training Program

## 2021-11-17 VITALS — BP 167/81 | HR 104 | Temp 97.9°F | Ht 69.0 in | Wt 162.0 lb

## 2021-11-17 DIAGNOSIS — G8929 Other chronic pain: Secondary | ICD-10-CM

## 2021-11-17 DIAGNOSIS — M5416 Radiculopathy, lumbar region: Secondary | ICD-10-CM

## 2021-11-17 DIAGNOSIS — M48061 Spinal stenosis, lumbar region without neurogenic claudication: Secondary | ICD-10-CM | POA: Diagnosis not present

## 2021-11-17 DIAGNOSIS — G894 Chronic pain syndrome: Secondary | ICD-10-CM

## 2021-11-17 DIAGNOSIS — M5126 Other intervertebral disc displacement, lumbar region: Secondary | ICD-10-CM

## 2021-11-17 NOTE — Progress Notes (Signed)
Safety precautions to be maintained throughout the outpatient stay will include: orient to surroundings, keep bed in low position, maintain call bell within reach at all times, provide assistance with transfer out of bed and ambulation.  

## 2021-11-17 NOTE — Telephone Encounter (Signed)
Requested Prescriptions  ?Pending Prescriptions Disp Refills  ?? losartan (COZAAR) 50 MG tablet [Pharmacy Med Name: losartan 50 mg tablet] 90 tablet 0  ?  Sig: TAKE ONE TABLET BY MOUTH ONCE DAILY  ?  ? Cardiovascular:  Angiotensin Receptor Blockers Failed - 11/16/2021  8:42 AM  ?  ?  Failed - Last BP in normal range  ?  BP Readings from Last 1 Encounters:  ?11/17/21 (!) 167/81  ?   ?  ?  Passed - Cr in normal range and within 180 days  ?  Creatinine  ?Date Value Ref Range Status  ?09/16/2014 0.9 0.7 - 1.3 mg/dL Final  ? ?Creat  ?Date Value Ref Range Status  ?06/15/2021 0.92 0.70 - 1.28 mg/dL Final  ?   ?  ?  Passed - K in normal range and within 180 days  ?  Potassium  ?Date Value Ref Range Status  ?06/15/2021 4.0 3.5 - 5.3 mmol/L Final  ?09/16/2014 3.7 3.5 - 5.1 mEq/L Final  ?   ?  ?  Passed - Patient is not pregnant  ?  ?  Passed - Valid encounter within last 6 months  ?  Recent Outpatient Visits   ?      ? 1 month ago Derangement of right SI joint  ? [facility] [name], MD  ? 1 month ago Derangement of right SI joint  ? [facility] [name], MD  ? 2 months ago Biceps tendinitis, right  ? [facility] [name], MD  ? 2 months ago Biceps tendinitis, right  ? [facility] [name], MD  ? 2 months ago Chronic right shoulder pain  ? [place], DO  ?  ?  ?Future Appointments   ?        ? In 3 weeks Parks Ranger, Devonne Doughty, DO [facility] [place]  ? In 1 month Ralene Bathe, MD [place]  ? In 1 month Brand Males, MD [facility]  ? In 5 months  [facility] [place]  ?  ? ?  ?  ?  ?? metFORMIN (GLUCOPHAGE-XR) 500 MG 24 hr tablet [Pharmacy Med Name: metformin ER 500 mg tablet,extended release 24 hr] 90 tablet 0  ?  Sig: TAKE ONE TABLET BY MOUTH EVERYDAY AT BEDTIME  ?  ? Endocrinology:  Diabetes - Biguanides Failed - 11/16/2021  8:42 AM  ?  ?  Failed  - B12 Level in normal range and within 720 days  ?  Vitamin B-12  ?Date Value Ref Range Status  ?09/16/2014 1,101 (H) 211 - 911 pg/mL Final  ?   ?  ?  Passed - Cr in normal range and within 360 days  ?  Creatinine  ?Date Value Ref Range Status  ?09/16/2014 0.9 0.7 - 1.3 mg/dL Final  ? ?Creat  ?Date Value Ref Range Status  ?06/15/2021 0.92 0.70 - 1.28 mg/dL Final  ?   ?  ?  Passed - HBA1C is between 0 and 7.9 and within 180 days  ?  Hgb A1c MFr Bld  ?Date Value Ref Range Status  ?06/15/2021 6.7 (H) <5.7 % of total Hgb Final  ?  Comment:  ?  For someone without known diabetes, a hemoglobin A1c ?value of 6.5% or greater indicates that they may have  ?diabetes and this should be confirmed with a follow-up  ?test. ?. ?For someone with  known diabetes, a value <7% indicates  ?that their diabetes is well controlled and a value  ?greater than or equal to 7% indicates suboptimal  ?control. A1c targets should be individualized based on  ?duration of diabetes, age, comorbid conditions, and  ?other considerations. ?. ?Currently, no consensus exists regarding use of ?hemoglobin A1c for diagnosis of diabetes for children. ?. ?  ?   ?  ?  Passed - eGFR in normal range and within 360 days  ?  GFR, Est African American  ?Date Value Ref Range Status  ?06/02/2020 102 > OR = 60 mL/min/1.4m Final  ? ?GFR, Est Non African American  ?Date Value Ref Range Status  ?06/02/2020 88 > OR = 60 mL/min/1.742mFinal  ? ?eGFR  ?Date Value Ref Range Status  ?06/15/2021 86 > OR = 60 mL/min/1.7358minal  ?  Comment:  ?  The eGFR is based on the CKD-EPI 2021 equation. To calculate  ?the new eGFR from a previous Creatinine or Cystatin C ?result, go to https://www.kidney.org/professionals/ ?kdoqi/gfr%5Fcalculator ?  ?   ?  ?  Passed - Valid encounter within last 6 months  ?  Recent Outpatient Visits   ?      ? 1 month ago [facility] right SI joint  [facility]  ? 1 month [facility] right SI joint  [facility]  ? 2 months ago [facility] tendinitis, right  [facility]  ? 2 months ago [facility]iceps tendinitis, right  ? [facility]  ? 2 months ago Chronic right shoulder pain  ? [facility]  ?  ?  ?Future Appointments   ?        ? In 3 weeks KarParks RangerleDevonne DoughtyO [facility] In 1 month KowRalene BatheD [place] In 1 month RamBrand MalesD [facility]  ? In 5 months  [place]  ? ?  ?  ?  Passed - CBC within normal limits and completed in the last 12 months  ?  WBC  ?Date Value Ref Range Status  ?06/15/2021 7.9 3.8 - 10.8 Thousand/uL Final  ? ?RBC  ?Date Value Ref Range Status  ?06/15/2021 4.27 4.20 - 5.80 Million/uL Final  ? ?Hemoglobin  ?Date Value Ref Range Status  ?06/15/2021 13.7 13.2 - 17.1 g/dL Final  ? ?HGB  ?Date Value Ref Range Status  ?09/16/2014 11.6 (L) 13.0 - 17.1 g/dL Final  ? ?HCT  ?Date Value Ref Range Status  ?06/15/2021 39.4 38.5 - 50.0 % Final  ?09/16/2014 33.6 (L) 38.4 - 49.9 % Final  ? ?MCHC  ?Date Value Ref Range Status  ?06/15/2021 34.8 32.0 - 36.0 g/dL Final  ? ?MCH  ?Date Value Ref Range Status  ?06/15/2021 32.1 27.0 - 33.0 pg Final  ? ?MCV  ?Date Value Ref Range Status  ?06/15/2021 92.3 80.0 - 100.0 fL Final  ?09/16/2014 81.4 79.3 - 98.0 fL Final  ? ?No results found for: PLTCOUNTKUC, LABPLAT, POCBowmanDW  ?Date Value Ref Range Status  ?06/15/2021 13.0 11.0 - 15.0 % Final  ?09/16/2014 13.9 11.0 - 14.6 % Final  ? ?  ?  ?  ?? amLODipine (NORVASC) 5 MG tablet [Pharmacy Med Name: amlodipine 5 mg tablet] 90 tablet 0  ?  Sig: TAKE ONE TABLET BY MOUTH ONCE DAILY  ?  ? Cardiovascular: Calcium Channel Blockers 2 Failed - 11/16/2021  8:42 AM  ?  ?  Failed - Last BP in normal range  ?  BP Readings from Last 1 Encounters:  ?11/17/21 (!) 167/81  ?   ?  ?  Failed - Last Heart Rate in  normal range  ?  Pulse Readings from Last 1 Encounters:  ?11/17/21 (!) 104  ?   ?  ?  Passed - Valid encounter within last 6 months  ?  Recent Outpatient Visits   ?      ? 1 month ago Derangement of right SI joint  ? [facility] [name], MD  ? 1 month ago Derangement of right SI joint  ? [facility] [name], MD  ? 2 months ago Biceps tendinitis, right  ? [facility] [name], MD  ? 2 months ago Biceps tendinitis, right  ? [facility] [name], MD  ? 2 months ago Chronic right shoulder pain  ? [place], DO  ?  ?  ?Future Appointments   ?        ? In 3 weeks Parks Ranger, Devonne Doughty, DO [facility] [place]  ? In 1 month Ralene Bathe, MD [place]  ? In 1 month Brand Males, MD [facility]  ? In 5 months  [place]  ?  ? ?  ?  ?  ?? hydrochlorothiazide (HYDRODIURIL) 12.5 MG tablet [Pharmacy Med Name: hydrochlorothiazide 12.5 mg tablet] 90 tablet 0  ?  Sig: TAKE ONE TABLET BY MOUTH ONCE DAILY  ?  ? Cardiovascular: Diuretics - Thiazide Failed - 11/16/2021  8:42 AM  ?  ?  Failed - Last BP in normal range  ?  BP Readings from Last 1 Encounters:  ?11/17/21 (!) 167/81  ?   ?  ?  Passed - Cr in normal range and within 180 days  ?  Creatinine  ?Date Value Ref Range Status  ?09/16/2014 0.9 0.7 - 1.3 mg/dL Final  ? ?Creat  ?Date Value Ref Range Status  ?06/15/2021 0.92 0.70 - 1.28 mg/dL Final  ?   ?  ?  Passed - K in normal range and within 180 days  ?  Potassium  ?Date Value Ref Range Status  ?06/15/2021 4.0 3.5 - 5.3 mmol/L Final  ?09/16/2014 3.7 3.5 - 5.1 mEq/L Final  ?   ?  ?  Passed - Na in normal range and within 180 days  ?  Sodium  ?Date Value Ref Range Status  ?06/15/2021 136 135 - 146 mmol/L Final  ?09/16/2014 140 136 - 145 mEq/L Final  ?   ?  ?  Passed - Valid encounter within last 6 months  ?  Recent Outpatient Visits   ?      ? 1  month ago Derangement of right SI joint  ? [facility] [name], MD  ? 1 month ago Derangement of right SI joint  ? [facility] [name], MD  ? 2 months ago Biceps tendi

## 2021-11-17 NOTE — Progress Notes (Signed)
PROVIDER NOTE: Information contained herein reflects review and annotations entered in association with encounter. Interpretation of such information and data should be left to medically-trained personnel. Information provided to patient can be located elsewhere in the medical record under "Patient Instructions". Document created using STT-dictation technology, any transcriptional errors that may result from process are unintentional.  ?  ?Patient: Isaac Weber  Service Category: E/M  Provider: Gillis Santa, MD  ?DOB: Aug 07, 1945  DOS: 11/17/2021  Specialty: Interventional Pain Management  ?MRN: [id number]  Setting: Ambulatory outpatient  PCP: Olin Hauser, DO  ?Type: Established Patient    Referring Provider: Nobie Putnam *  ?Location: Office  Delivery: Face-to-face    ? ?Primary Reason(s) for Visit: Encounter for evaluation before starting new chronic pain management plan of care (Level of risk: moderate) ?CC: Back Pain (lower) ? ?HPI  ?Mr. Hurn is a 76 y.o. year old, male patient, who comes today for a follow-up evaluation to review the test results and decide on a treatment plan. He has Essential hypertension; Cough variant asthma; Type 2 diabetes mellitus with other specified complication ([place]); Kidney cysts; Pancreatic cyst; Iron deficiency anemia; Hyperlipidemia associated with type 2 diabetes mellitus ([place]); Primary osteoarthritis involving multiple joints; BPH associated with nocturia; Abnormal findings on diagnostic imaging of lung; Chronic cough; Centrilobular emphysema ([place]); Healthcare maintenance; Rhinitis; Chronic pansinusitis; Deviated septum; History of chronic sinusitis; Biceps tendinitis, right; Supraspinatus syndrome, right; Derangement of right SI joint; Spondylosis of lumbosacral region without myelopathy or radiculopathy; Chronic radicular lumbar pain; Failed back surgical syndrome; History of lumbar surgery; Chronic pain syndrome; 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 His primarily concern today is the Back Pain (lower) ? ?Pain Assessment: ?Location: Right Back ?Radiating: pain radiaties down his right side ?Onset: More than a month ago ?Duration: Chronic pain ?Quality: Aching, Sharp ?Severity: 4 /10 (subjective, self-reported pain score)  ?Effect on ADL: limits my daily activities ?Timing: Intermittent ?Modifying factors: laying down ?BP: (!) 167/81  HR: (!) 104 ? ?Mr. Prosser comes in today for a follow-up visit after his initial evaluation on 10/27/2021. Today we went over the results of his tests specifically his lumbar MRI. These were explained in "Layman's terms". During today's appointment we went over my diagnostic impression, as well as the proposed treatment plan. ? ?Further details on both, my assessment(s), as well as the proposed treatment plan, please see below. ? ?Please see H&P from initial clinic note: ?[name] is a pleasant 76 year old male who presents with a chief complaint of low back pain with radiation into his right buttock and right hip at times.  This started in 2018 after a mountain climbing incident.  He states that since then he has had increased lower back pain with occasional radiation into his right calf as well.  He has been seen and evaluated by Dr. Zigmund Daniel with sports medicine.  To work-up SI joint pathology/differential there was a right SI joint injection done under ultrasound guidance which unfortunately did not provide significant pain relief.  Patient is starting physical therapy this week.  He is currently on baclofen and Mobic.  He takes hydrocodone as needed very sparingly.  He has a history of COPD and asthma.  He is a type II diabetic not on insulin.  No history of anticoagulants or antiplatelets.  He was recently started on gabapentin 100 mg 3 times daily for chronic cough.  Of note he has a history of 1  cervical spine surgery and 2 prior  lumbar spine surgeries, 1 in the 1990 followed by another 1 in 2010 by Dr. Ellene Route.  No hardware was placed in any of his lumbar spine surgeries.  Patient states that when his pain radiates into his right calf, he describes it as an electrical, burning, tingling sensation.  Patient was a former Company secretary. ? ?Laboratory Chemistry Profile  ? ?Renal ?Lab Results  ?Component Value Date  ? BUN 23 06/15/2021  ? CREATININE 0.92 06/15/2021  ? BCR NOT APPLICABLE 82/42/3536  ? GFRAA 102 06/02/2020  ? GFRNONAA 88 06/02/2020  ? SPECGRAV 1.020 10/01/2020  ? PHUR 7.5 10/01/2020  ? PROTEINUR Negative 10/01/2020  ? ?  Electrolytes ?Lab Results  ?Component Value Date  ? NA 136 06/15/2021  ? K 4.0 06/15/2021  ? CL 101 06/15/2021  ? CALCIUM 9.5 06/15/2021  ? ?  ?Hepatic ?Lab Results  ?Component Value Date  ? AST 15 06/15/2021  ? ALT 21 06/15/2021  ? ALBUMIN 3.9 03/09/2018  ? ALKPHOS 86 03/09/2018  ? ?  ID ?Lab Results  ?Component Value Date  ? [place] NEGATIVE 08/27/2019  ? ?  ?Bone ?No results found for: [place], [id number], [id number], [id number], 25OHVITD1, 25OHVITD2, 25OHVITD3, TESTOFREE, TESTOSTERONE ?  Endocrine ?Lab Results  ?Component Value Date  ? GLUCOSE 127 (H) 06/15/2021  ? GLUCOSEU Negative 10/01/2020  ? HGBA1C 6.7 (H) 06/15/2021  ? TSH 1.51 06/02/2020  ? ?  ?Neuropathy ?Lab Results  ?Component Value Date  ? [id number] 1,101 (H) 09/16/2014  ? FOLATE >20.0 09/16/2014  ? HGBA1C 6.7 (H) 06/15/2021  ? ?  CNS ?No results found for: COLORCSF, APPEARCSF, RBCCOUNTCSF, WBCCSF, POLYSCSF, LYMPHSCSF, EOSCSF, PROTEINCSF, GLUCCSF, JCVIRUS, CSFOLI, IGGCSF, LABACHR, ACETBL, LABACHR, ACETBL ?  ?Inflammation (CRP: Acute  ESR: Chronic) ?No results found for: CRP, ESRSEDRATE, LATICACIDVEN ?  Rheumatology ?No results found for: RF, ANA, [place], [place], [place], [place], HLAB27 ?  ?Coagulation ?Lab Results  ?Component Value Date  ? INR 0.99 03/09/2018  ? LABPROT 13.0 03/09/2018  ? APTT 32 03/09/2018  ? PLT 191 06/15/2021  ? ?   Cardiovascular ?Lab Results  ?Component Value Date  ? HGB 13.7 06/15/2021  ? HCT 39.4 06/15/2021  ? ?  ?Screening ?Lab Results  ?Component Value Date  ? [place] NEGATIVE 08/27/2019  ? ?  Cancer ?No results found for: CEA, CA125, LABCA2 ?  ?Allergens ?No results found for: ALMOND, APPLE, ASPARAGUS, AVOCADO, BANANA, BARLEY, BASIL, BAYLEAF, GREENBEAN, LIMABEAN, WHITEBEAN, BEEFIGE, REDBEET, BLUEBERRY, BROCCOLI, CABBAGE, MELON, CARROT, CASEIN, CASHEWNUT, CAULIFLOWER, CELERY ?    ?Note: Lab results reviewed. ? ?Recent Diagnostic Imaging Review  ? ?MR LUMBAR SPINE WO CONTRAST ? ?Narrative ?CLINICAL DATA:  Low back pain, symptoms persist with > 6 wks ?treatment Lumbar radiculopathy. Lower back pain radiates down right ?leg for 8 weeks. History of lumbar spine surgery. ? ?EXAM: ?MRI LUMBAR SPINE WITHOUT CONTRAST ? ?TECHNIQUE: ?Multiplanar, multisequence MR imaging of the lumbar spine was ?performed. No intravenous contrast was administered. ? ?COMPARISON:  MR lumbar 01/18/2009; X-ray lumbar 10/27/2021. ? ?FINDINGS: ?Segmentation: Standard; the lowest formed disc space is designated ?L5-S1. ? ?Alignment: There is mild levocurvature centered at L2. There is ?grade 1 retrolisthesis of L1 on L2 and L2 on L3, grade 1 ?anterolisthesis of L4 on L5, and grade 1 retrolisthesis of L5 on S1, ?overall not significantly changed since 2010. ? ?Vertebrae: Vertebral body heights are preserved. Background marrow ?signal is normal. There is no suspicious signal abnormality or ?marrow edema. ? ?Conus medullaris and cauda equina:  Conus extends to the L1 level. ?Conus and cauda equina appear normal. ? ?Paraspinal and other soft tissues: T2 hyperintense lesions in the ?kidneys likely reflects cysts, for which no specific imaging ?follow-up is required. The paraspinal soft tissues are unremarkable. ? ?Disc levels: ? ?There is multilevel disc desiccation and narrowing throughout the ?lumbar spine, most advanced at L4-L5 and L5-S1 with relative  sparing ?of L3-L4, overall progressed since 2010. ? ?T12-L1: There is a mild disc bulge and mild facet arthropathy ?without significant spinal canal or neural foraminal stenosis, not ?significantly changed ? ?L1-L

## 2021-11-17 NOTE — Telephone Encounter (Signed)
Requested medications are due for refill today.  yes ? ?Requested medications are on the active medications list.  yes ? ?Last refill. 08/20/2020 ? ?Future visit scheduled.   yes ? ?Notes to clinic.  Medication is listed as historical. ? ? ? ?Requested Prescriptions  ?Pending Prescriptions Disp Refills  ? amLODipine (NORVASC) 5 MG tablet [Pharmacy Med Name: amlodipine 5 mg tablet] 90 tablet 0  ?  Sig: TAKE ONE TABLET BY MOUTH ONCE DAILY  ?  ? Cardiovascular: Calcium Channel Blockers 2 Failed - 11/16/2021  8:42 AM  ?  ?  Failed - Last BP in normal range  ?  BP Readings from Last 1 Encounters:  ?11/17/21 (!) 167/81  ?  ?  ?  ?  Failed - Last Heart Rate in normal range  ?  Pulse Readings from Last 1 Encounters:  ?11/17/21 (!) 104  ?  ?  ?  ?  Passed - Valid encounter within last 6 months  ?  Recent Outpatient Visits   ? ?      ? 1 month ago Derangement of right SI joint  ? [facility] [name], MD  ? 1 month ago Derangement of right SI joint  ? [facility] [name], MD  ? 2 months ago Biceps tendinitis, right  ? [facility] [name], MD  ? 2 months ago Biceps tendinitis, right  ? [facility] [name], MD  ? 2 months ago Chronic right shoulder pain  ? [place], DO  ? ?  ?  ?Future Appointments   ? ?        ? In 3 weeks Parks Ranger, Devonne Doughty, DO [facility], [place]  ? In 1 month Ralene Bathe, MD [place]  ? In 1 month Brand Males, MD [facility]  ? In 5 months  [facility] [place]  ? ?  ? ? ?  ?  ?  ?Signed Prescriptions Disp Refills  ? losartan (COZAAR) 50 MG tablet 90 tablet 0  ?  Sig: TAKE ONE TABLET BY MOUTH ONCE DAILY  ?  ? Cardiovascular:  Angiotensin Receptor Blockers Failed - 11/16/2021  8:42 AM  ?  ?  Failed - Last BP in normal range  ?  BP Readings from Last 1 Encounters:  ?11/17/21 (!) 167/81  ?  ?  ?  ?  Passed - Cr in normal range  and within 180 days  ?  Creatinine  ?Date Value Ref Range Status  ?09/16/2014 0.9 0.7 - 1.3 mg/dL Final  ? ?Creat  ?Date Value Ref Range Status  ?06/15/2021 0.92 0.70 - 1.28 mg/dL Final  ?  ?  ?  ?  Passed - K in normal range and within 180 days  ?  Potassium  ?Date Value Ref Range Status  ?06/15/2021 4.0 3.5 - 5.3 mmol/L Final  ?09/16/2014 3.7 3.5 - 5.1 mEq/L Final  ?  ?  ?  ?  Passed - Patient is not pregnant  ?  ?  Passed - Valid encounter within last 6 months  ?  Recent Outpatient Visits   ? ?      ? 1 month ago Derangement of right SI joint  ? [facility] [name], MD  ? 1 month ago Derangement of right SI joint  ? [facility] [name], MD  ? 2 months ago Biceps  tendinitis, right  ? [facility] [name], MD  ? 2 months ago Biceps tendinitis, right  ? [facility] [name], MD  ? 2 months ago Chronic right shoulder pain  ? [place], DO  ? ?  ?  ?Future Appointments   ? ?        ? In 3 weeks Parks Ranger, Devonne Doughty, DO [facility] [place]  ? In 1 month Ralene Bathe, MD [place]  ? In 1 month Brand Males, MD [facility]  ? In 5 months  [facility]  ? ?  ? ? ?  ?  ?  ? metFORMIN (GLUCOPHAGE-XR) 500 MG 24 hr tablet 90 tablet 0  ?  Sig: TAKE ONE TABLET BY MOUTH EVERYDAY AT BEDTIME  ?  ? Endocrinology:  Diabetes - Biguanides Failed - 11/16/2021  8:42 AM  ?  ?  Failed - B12 Level in normal range and within 720 days  ?  Vitamin B-12  ?Date Value Ref Range Status  ?09/16/2014 1,101 (H) 211 - 911 pg/mL Final  ?  ?  ?  ?  Passed - Cr in normal range and within 360 days  ?  Creatinine  ?Date Value Ref Range Status  ?09/16/2014 0.9 0.7 - 1.3 mg/dL Final  ? ?Creat  ?Date Value Ref Range Status  ?06/15/2021 0.92 0.70 - 1.28 mg/dL Final  ?  ?  ?  ?  Passed - HBA1C is between 0 and 7.9 and within 180 days  ?  Hgb A1c MFr Bld  ?Date Value Ref  Range Status  ?06/15/2021 6.7 (H) <5.7 % of total Hgb Final  ?  Comment:  ?  For someone without known diabetes, a hemoglobin A1c ?value of 6.5% or greater indicates that they may have  ?diabetes and this should be confirmed with a follow-up  ?test. ?. ?For someone with known diabetes, a value <7% indicates  ?that their diabetes is well controlled and a value  ?greater than or equal to 7% indicates suboptimal  ?control. A1c targets should be individualized based on  ?duration of diabetes, age, comorbid conditions, and  ?other considerations. ?. ?Currently, no consensus exists regarding use of ?hemoglobin A1c for diagnosis of diabetes for children. ?. ?  ?  ?  ?  ?  Passed - eGFR in normal range and within 360 days  ?  GFR, Est African American  ?Date Value Ref Range Status  ?06/02/2020 102 > OR = 60 mL/min/1.66m Final  ? ?GFR, Est Non African American  ?Date Value Ref Range Status  ?06/02/2020 88 > OR = 60 mL/min/1.71mFinal  ? ?eGFR  ?Date Value Ref Range Status  ?06/15/2021 86 > OR = 60 mL/min/1.7339minal  ?  Comment:  ?  The eGFR is based on the CKD-EPI 2021 equation. To calculate  ?the new eGFR from a previous Creatinine or Cystatin C ?result, go to https://www.kidney.org/professionals/ ?kdoqi/gfr%5Fcalculator ?  ?  ?  ?  ?  Passed - Valid encounter within last 6 months  ?  Recent Outpatient Visits   ? ?      ? 1 month [facility]  ? [facility] month [facility] SI [facility]  ? 2 months ago [facility] tend[facility], right  [name]  ? 2 months ago Biceps tendinitis, right  ? [facility] [name], MD  ? 2 months ago Chronic right shoulder pain  ? [place], DO  ? ?  ?  ?Future Appointments   ? ?        ? In 3 weeks Parks Ranger, Devonne Doughty, DO [facility], [place]  ? In 1 month Ralene Bathe, MD [place]  ? In 1 month Brand Males, MD [facility]  ? In 5 months  [place]  ? ?  ? ? ?  ?  ?  Passed - CBC within normal limits and completed in the last 12 months  ?  WBC  ?Date Value Ref Range Status  ?06/15/2021 7.9 3.8 - 10.8 Thousand/uL Final  ? ?RBC  ?Date Value Ref Range Status  ?06/15/2021 4.27 4.20 - 5.80 Million/uL Final  ? ?Hemoglobin  ?Date Value Ref Range Status  ?06/15/2021 13.7 13.2 - 17.1 g/dL Final  ? ?HGB  ?Date Value Ref Range Status  ?09/16/2014 11.6 (L) 13.0 - 17.1 g/dL Final  ? ?HCT  ?Date Value Ref Range Status  ?06/15/2021 39.4 38.5 - 50.0 % Final  ?09/16/2014 33.6 (L) 38.4 - 49.9 % Final  ? ?MCHC  ?Date Value Ref Range Status  ?06/15/2021 34.8 32.0 - 36.0 g/dL Final  ? ?MCH  ?Date Value Ref Range Status  ?06/15/2021 32.1 27.0 - 33.0 pg Final  ? ?MCV  ?Date Value Ref Range Status  ?06/15/2021 92.3 80.0 - 100.0 fL Final  ?09/16/2014 81.4 79.3 - 98.0 fL Final  ? ?No results found for: PLTCOUNTKUC, LABPLAT, [place] ?RDW  ?Date Value Ref Range Status  ?06/15/2021 13.0 11.0 - 15.0 % Final  ?09/16/2014 13.9 11.0 - 14.6 % Final  ? ?  ?  ?  ? hydrochlorothiazide (HYDRODIURIL) 12.5 MG tablet 90 tablet 0  ?  Sig: TAKE ONE TABLET BY MOUTH ONCE DAILY  ?  ? Cardiovascular: Diuretics - Thiazide Failed - 11/16/2021  8:42 AM  ?  ?  Failed - Last BP in normal range  ?  BP Readings from Last 1 Encounters:  ?11/17/21 (!) 167/81  ?  ?  ?  ?  Passed - Cr in normal range and within 180 days  ?  Creatinine  ?Date Value Ref Range Status  ?09/16/2014 0.9 0.7 - 1.3 mg/dL Final  ? ?Creat  ?Date Value Ref Range Status  ?06/15/2021 0.92 0.70 - 1.28 mg/dL Final  ?  ?  ?  ?  Passed - K in normal range and within 180 days  ?  Potassium  ?Date Value Ref Range Status  ?06/15/2021 4.0 3.5 - 5.3 mmol/L Final  ?09/16/2014 3.7 3.5 - 5.1 mEq/L Final  ?  ?  ?  ?  Passed - Na in normal range and within 180 days  ?  Sodium  ?Date Value Ref Range Status  ?06/15/2021 136 135 -  146 mmol/L Final  ?09/16/2014 140 136 - 145 mEq/L Final  ?  ?  ?  ?  Passed - Valid encounter within last 6 months  ?  Recent Outpatient Visits   ? ?      ? 1 month ago Derangement of right SI joint  ? Me

## 2021-11-18 ENCOUNTER — Encounter: Payer: Self-pay | Admitting: Physical Therapy

## 2021-11-18 ENCOUNTER — Ambulatory Visit: Payer: PPO

## 2021-11-18 DIAGNOSIS — M6281 Muscle weakness (generalized): Secondary | ICD-10-CM

## 2021-11-18 DIAGNOSIS — M47817 Spondylosis without myelopathy or radiculopathy, lumbosacral region: Secondary | ICD-10-CM

## 2021-11-18 DIAGNOSIS — M249 Joint derangement, unspecified: Secondary | ICD-10-CM | POA: Diagnosis not present

## 2021-11-18 NOTE — Therapy (Signed)
?OUTPATIENT PHYSICAL THERAPY THORACOLUMBAR TREATMENT/RECERTIFICATION ? ? ?Patient Name: Isaac [facility] ?MRN: [id number] ?DOB:Feb 06, 1946, 76 y.o., male ?Today's Date: 11/18/2021 ? ? PT End of Session - 11/18/21 1436   ? ? Visit Number 8   ? Number of Visits 16   ? Date for PT Re-Evaluation 12/16/21   ? PT Start Time 1432   ? PT Stop Time 7902   ? PT Time Calculation (min) 42 min   ? Activity Tolerance Patient tolerated treatment well   ? Behavior During Therapy [facility] for tasks assessed/performed   ? ?  ?  ? ?  ? ? ?Past Medical History:  ?Diagnosis Date  ? Abnormal chest CT   ? Allergic rhinitis, unspecified   ? Anemia   ? Aortic atherosclerosis ([place])   ? BPH (benign prostatic hyperplasia)   ? COPD (chronic obstructive pulmonary disease) ([place])   ? Coronary artery disease   ? Cough variant asthma   ? Glaucoma   ? Hypertension   ? Other chronic pain   ? Pancreatic lesion   ? Pure hypercholesterolemia, unspecified   ? 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 R upper forearm, [facility]  ? 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 R medial lower pretibial, [facility]  ? 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 left medial lower leg above medial ankle; SCC/KA type. Tx: [facility]  ? 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 Right neck proximal mandible. WD SCC, ulcerated. [facility] 04/29/2020  ? ?Past Surgical History:  ?Procedure Laterality Date  ? BACK SURGERY    ? BUNIONECTOMY Left 2003  ? hammer toe as well  ? BUNIONECTOMY WITH HAMMERTOE RECONSTRUCTION Left 2013  ? repeat  ? CERVICAL DISC SURGERY    ? COLONOSCOPY WITH PROPOFOL N/A 04/30/2021  ? Procedure: COLONOSCOPY WITH PROPOFOL;  Surgeon: Jonathon Bellows, MD;  Location: [facility] ENDOSCOPY;  Service: Gastroenterology;  Laterality: N/A;  ? GREEN LIGHT LASER TURP (TRANSURETHRAL RESECTION OF PROSTATE  2010  ? laser, shrink prostate  ? KNEE ARTHROSCOPY Right 1991  ? LEG SURGERY Left   ? BENIGN BONE TUMOR  ? [place] SURGERY  2010  ? discectomy  ? [place] SURGERY  2011  ? PROSTATE SURGERY   2002  ? shrink prostate  ? ROTATOR CUFF REPAIR Left 1999  ? debride, remove bonespur  ? ROTATOR CUFF REPAIR Right 1996  ? ?Patient Active Problem List  ? Diagnosis Date Noted  ? 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 Chronic radicular lumbar pain 10/27/2021  ? 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 History of lumbar surgery 10/27/2021  ? Chronic pain syndrome 10/27/2021  ? Derangement of right SI joint 09/28/2021  ? Spondylosis of lumbosacral region without myelopathy or radiculopathy 09/28/2021  ? Biceps tendinitis, right 08/26/2021  ? 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 Deviated septum 07/28/2021  ? History of chronic sinusitis 07/28/2021  ? Chronic pansinusitis 05/21/2021  ? Rhinitis 02/04/2021  ? Abnormal findings on diagnostic imaging of lung 07/31/2020  ? Chronic cough 07/31/2020  ? Centrilobular emphysema ([place]) 07/31/2020  ? Healthcare maintenance 07/31/2020  ? Type 2 diabetes mellitus with other specified complication ([place]) 40/[id number]/3532  ? Kidney cysts 01/11/2020  ? Pancreatic cyst 01/11/2020  ? Iron deficiency anemia 01/11/2020  ? Hyperlipidemia associated with type 2 diabetes mellitus ([place]) 01/11/2020  ? Primary osteoarthritis involving multiple joints 01/11/2020  ? BPH associated with nocturia 01/11/2020  ?  Cough variant asthma 08/17/2019  ? Essential hypertension   ? ? ?PCP: Isaac Hauser, DO ? ?REFERRING PROVIDER: [name], MD ? ?REFERRING DIAG: Diagnosis M24.9 (ICD-10-CM) - Derangement of right SI joint M47.27 (ICD-10-CM) - Osteoarthritis of spine with radiculopathy, lumbosacral region  ? ?THERAPY DIAG:  ?Derangement of right SI joint ? ?Spondylosis of lumbosacral region without myelopathy or radiculopathy ? ?Muscle weakness (generalized) ? ?ONSET DATE: 09/09/21 ? ?SUBJECTIVE:                                                                                                                                                                                           ? ?SUBJECTIVE STATEMENT: ?Pt. Reports he is planning on getting L4-L5 epidural next week. Having upwards to 6-7/10 LBP with sleeping. Reports only calf pain no LBP which pt states "I can live with". ? ? ?PERTINENT HISTORY:  ?Derangement of right SI joint ?[name] presents for follow-up to both lateral right hip pain, ongoing for a few weeks, onset after a 1+ mile hike.  He does have intermittent right lower extremity paresthesias.  At his last visit on 09/28/2021 he was advised scheduled meloxicam, escalation to prednisone x5 days, and close follow-up. ?  ?He states that while he noted mild improvement with meloxicam, he did initiate the prednisone which did provide further symptom control though he has noted mild recurrence since transitioning back to meloxicam. ?  ?Examination today does reveal both positive seated straight leg raise on the right, benign left, and focal tenderness at the right SI joint.  I did discuss the interplay of the sacroiliac joint and lumbosacral spine, especially given his extensive spine history inclusive of surgery and his radicular features noted today on the right.  Given treatments to date, additional strategies reviewed and he did elect to proceed with ultrasound-guided right SI joint injection, post care reviewed, and timeline for response relayed.  He will transition to as needed meloxicam from a medication management standpoint.  Lastly, I have placed a referral to pain and spine for any recalcitrant symptomatology.  If tolerable from a symptom standpoint, he can start home exercises and a gentle and graded manner, these were provided to him today. ? ?PAIN:  ?Are you having pain? 6/10 in R calf. No LBP. ? ?PRECAUTIONS: None ? ?WEIGHT BEARING RESTRICTIONS No ? ?FALLS:  ?Has patient fallen in last 6 months? Yes. Number of falls 1 ? ?LIVING ENVIRONMENT: ?Lives with: lives with their family and lives  with their spouse ?Lives in: House/apartment ? ?OCCUPATION: retired ? ?PLOF: Independent ? ?PATIENT GOALS Decrease pain/ return to exercise program and golf weekly.   ? ? ?  OBJECTIVE:   Evaluation date: 10/28/21 ? ?DIAGNOSTIC FINDINGS:  ?4/13: ? ?IMPRESSION: ?1. Grade 1 anterolisthesis of L4 on L5 with associated advanced ?facet arthropathy resulting in severe spinal canal stenosis with ?impingement of the cauda equina nerve roots and moderate to severe ?left and severe right neural foraminal stenosis, progressed since ?2010. ?2. Severe left and moderate right neural foraminal stenosis at ?L2-L3, progressed since 2010. ?3. Disc bulge at L5-S1 with possible impingement of the traversing ?right S1 nerve root. There is also narrowing of the right ?subarticular zone at L1-L2 with potential irritation of the ?traversing L2 nerve root. ?4. Grade 1 retrolisthesis of L1 on L2, L2 on L3, and L5 on S1, ?similar to 2010. There is mild levocurvature centered at L2. ?  ?  ?Electronically Signed ?  By: Valetta Mole M.D. ? ? ?IMPRESSION:  ? ?4/5:  ?1. Extensive multilevel lumbar spondylosis and facet hypertrophy, ?greatest at L4-5 and L5-S1. ?2. Retrolisthesis of L1 on L2 and L2 on L3, with grade 1 ?anterolisthesis of L4 on L5. No pars defects. ?3. No instability with flexion or extension. ? ? ?3/2:  ?R hip X-ray ?Mild bilateral femoroacetabular and sacroiliac osteoarthritis. ? ?PATIENT SURVEYS:  ?FOTO initial 39/ goal 53 ? ?SCREENING FOR RED FLAGS: ?Bowel or bladder incontinence: No ?Spinal tumors: No ?Cauda equina syndrome: No ?Compression fracture: No ?Abdominal aneurysm: No ? ?COGNITION: ? Overall cognitive status: Within functional limits for tasks assessed   ?  ?SENSATION: ?WFL ? ?POSTURE:  ?Slight forward head/ rounded shoulder posture.  ? ?PALPATION: ?Low thoracic/ lumbar paraspinal muscle tightness.  ? ?TODAY'S TREATMENT  ?11/18/21:  ? ?There.ex: ? ?Nu-Step L3 for 5 minutes for LE/UE warm up and spinal rotation.   ? ?Reassessment of goals. Pt remains unable to golf due to low back pain, has been compliant with HEP.  Has not been performing core and LE exercise training since LBP has been going on. FOTO from 72 to 83 with

## 2021-11-20 ENCOUNTER — Ambulatory Visit: Payer: PPO | Admitting: Physical Therapy

## 2021-11-20 ENCOUNTER — Ambulatory Visit: Payer: PPO | Admitting: Internal Medicine

## 2021-11-20 ENCOUNTER — Encounter: Payer: Self-pay | Admitting: Student in an Organized Health Care Education/Training Program

## 2021-11-20 ENCOUNTER — Encounter: Payer: Self-pay | Admitting: Physical Therapy

## 2021-11-20 ENCOUNTER — Encounter: Payer: Self-pay | Admitting: Internal Medicine

## 2021-11-20 VITALS — BP 130/74 | HR 84 | Temp 97.5°F | Ht 69.0 in | Wt 169.0 lb

## 2021-11-20 DIAGNOSIS — R053 Chronic cough: Secondary | ICD-10-CM

## 2021-11-20 DIAGNOSIS — M249 Joint derangement, unspecified: Secondary | ICD-10-CM | POA: Diagnosis not present

## 2021-11-20 DIAGNOSIS — M6281 Muscle weakness (generalized): Secondary | ICD-10-CM

## 2021-11-20 DIAGNOSIS — M47817 Spondylosis without myelopathy or radiculopathy, lumbosacral region: Secondary | ICD-10-CM

## 2021-11-20 LAB — NITRIC OXIDE: Nitric Oxide: 36

## 2021-11-20 NOTE — Therapy (Signed)
?OUTPATIENT PHYSICAL THERAPY THORACOLUMBAR TREATMENT ? ? ?Patient Name: Isaac Weber ?MRN: [id number] ?DOB:13-Jan-1946, 76 y.o., male ?Today's Date: 11/21/2021 ? ? ? ? PT End of Session - 11/20/21 0855   ? ? Visit Number 9   ? Number of Visits 16   ? Date for PT Re-Evaluation 12/16/21   ? PT Start Time [phone] to [phone]  ? Activity Tolerance Patient tolerated treatment well   ? Behavior During Therapy [facility] for tasks assessed/performed   ? ?  ?  ? ?  ? ? ? ? ?Past Medical History:  ?Diagnosis Date  ? Abnormal chest CT   ? Allergic rhinitis, unspecified   ? Anemia   ? Aortic atherosclerosis ([place])   ? BPH (benign prostatic hyperplasia)   ? COPD (chronic obstructive pulmonary disease) ([place])   ? Coronary artery disease   ? Cough variant asthma   ? Glaucoma   ? Hypertension   ? Other chronic pain   ? Pancreatic lesion   ? Pure hypercholesterolemia, unspecified   ? 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 R upper forearm, EDC  ? 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 R medial lower pretibial, [facility]  ? 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 left medial lower leg above medial ankle; SCC/KA type. Tx: ED[facility]  ? 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 Right neck proximal mandible. WD SCC, ulcerated. [facility] 04/29/2020  ? ?Past Surgical History:  ?Procedure Laterality Date  ? BACK SURGERY    ? BUNIONECTOMY Left 2003  ? hammer toe as well  ? BUNIONECTOMY WITH HAMMERTOE RECONSTRUCTION Left 2013  ? repeat  ? CERVICAL DISC SURGERY    ? COLONOSCOPY WITH PROPOFOL N/A 04/30/2021  ? Procedure: COLONOSCOPY WITH PROPOFOL;  Surgeon: Isaac Bellows, MD;  Location: [facility];  Service: Gastroenterology;  Laterality: N/A;  ? GREEN LIGHT LASER TURP (TRANSURETHRAL RESECTION OF PROSTATE  2010  ? laser, shrink prostate  ? KNEE ARTHROSCOPY Right 1991  ? LEG SURGERY Left   ? BENIGN BONE TUMOR  ? [place] SURGERY  2010  ? discectomy  ? [place] SURGERY  2011  ? PROSTATE SURGERY  2002  ? shrink prostate  ? ROTATOR CUFF REPAIR Left 1999   ? debride, remove bonespur  ? ROTATOR CUFF REPAIR Right 1996  ? ?Patient Active Problem List  ? Diagnosis Date Noted  ? 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 Chronic radicular lumbar pain 10/27/2021  ? 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 History of lumbar surgery 10/27/2021  ? Chronic pain syndrome 10/27/2021  ? Derangement of right SI joint 09/28/2021  ? Spondylosis of lumbosacral region without myelopathy or radiculopathy 09/28/2021  ? Biceps tendinitis, right 08/26/2021  ? 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 Deviated septum 07/28/2021  ? History of chronic sinusitis 07/28/2021  ? Chronic pansinusitis 05/21/2021  ? Rhinitis 02/04/2021  ? Abnormal findings on diagnostic imaging of lung 07/31/2020  ? Chronic cough 07/31/2020  ? Centrilobular emphysema ([place]) 07/31/2020  ? Healthcare maintenance 07/31/2020  ? Type 2 diabetes mellitus with other specified complication ([place]) [id number]/[id number]  ? Kidney cysts 01/11/2020  ? Pancreatic cyst 01/11/2020  ? Iron deficiency anemia 01/11/2020  ? Hyperlipidemia associated with type 2 diabetes mellitus ([place]) 01/11/2020  ? Primary osteoarthritis involving multiple joints 01/11/2020  ? BPH associated with nocturia 01/11/2020  ? Cough variant asthma 08/17/2019  ? Essential hypertension   ? ? ?  PCP: Isaac Hauser, DO ? ?REFERRING PROVIDER: [name], MD ? ?REFERRING DIAG: Diagnosis M24.9 (ICD-10-CM) - Derangement of right SI joint M47.27 (ICD-10-CM) - Osteoarthritis of spine with radiculopathy, lumbosacral region  ? ?THERAPY DIAG:  ?Derangement of right SI joint ? ?Spondylosis of lumbosacral region without myelopathy or radiculopathy ? ?Muscle weakness (generalized) ? ?ONSET DATE: 09/09/21 ? ?SUBJECTIVE:                                                                                                                                                                                           ? ?SUBJECTIVE STATEMENT: ?Pt. Reports he is planning on getting L4-L5 epidural next week (11/25/21). Pt. Slept better last night and was able to get onto side. 2-3/10 R lumbar symptoms this morning.  PT will decrease tx. Frequency to 1x/week with HEP focus.   ? ? ?PERTINENT HISTORY:  ?Derangement of right SI joint ?[name] presents for follow-up to both lateral right hip pain, ongoing for a few weeks, onset after a 1+ mile hike.  He does have intermittent right lower extremity paresthesias.  At his last visit on 09/28/2021 he was advised scheduled meloxicam, escalation to prednisone x5 days, and close follow-up. ?  ?He states that while he noted mild improvement with meloxicam, he did initiate the prednisone which did provide further symptom control though he has noted mild recurrence since transitioning back to meloxicam. ?  ?Examination today does reveal both positive seated straight leg raise on the right, benign left, and focal tenderness at the right SI joint.  I did discuss the interplay of the sacroiliac joint and lumbosacral spine, especially given his extensive spine history inclusive of surgery and his radicular features noted today on the right.  Given treatments to date, additional strategies reviewed and he did elect to proceed with ultrasound-guided right SI joint injection, post care reviewed, and timeline for response relayed.  He will transition to as needed meloxicam from a medication management standpoint.  Lastly, I have placed a referral to pain and spine for any recalcitrant symptomatology.  If tolerable from a symptom standpoint, he can start home exercises and a gentle and graded manner, these were provided to him today. ? ?PAIN:  ?Are you having pain? 2-3/10 R low back.   ? ?PRECAUTIONS: None ? ?WEIGHT BEARING RESTRICTIONS No ? ?FALLS:  ?Has patient fallen in last 6 months? Yes. Number of falls 1 ? ?LIVING ENVIRONMENT: ?Lives with: lives with their family and lives  with their spouse ?Lives in: House/apartment ? ?OCCUPATION: retired ? ?PLOF: Independent ? ?PATIENT GOALS Decrease pain/ return to exercise program and golf weekly.   ? ? ?  OBJECTIVE:   Evaluation date: 10/28/21 ? ?DIAGNOSTIC FINDINGS:  ?4/13: ? ?IMPRESSION: ?1. Grade 1 anterolisthesis of L4 on L5 with associated advanced ?facet arthropathy resulting in severe spinal canal stenosis with ?impingement of the cauda equina nerve roots and moderate to severe ?left and severe right neural foraminal stenosis, progressed since ?2010. ?2. Severe left and moderate right neural foraminal stenosis at ?L2-L3, progressed since 2010. ?3. Disc bulge at L5-S1 with possible impingement of the traversing ?right S1 nerve root. There is also narrowing of the right ?subarticular zone at L1-L2 with potential irritation of the ?traversing L2 nerve root. ?4. Grade 1 retrolisthesis of L1 on L2, L2 on L3, and L5 on S1, ?similar to 2010. There is mild levocurvature centered at L2. ?  ?  ?Electronically Signed ?  By: Valetta Mole M.D. ? ? ?IMPRESSION:  ? ?4/5:  ?1. Extensive multilevel lumbar spondylosis and facet hypertrophy, ?greatest at L4-5 and L5-S1. ?2. Retrolisthesis of L1 on L2 and L2 on L3, with grade 1 ?anterolisthesis of L4 on L5. No pars defects. ?3. No instability with flexion or extension. ? ? ?3/2:  ?R hip X-ray ?Mild bilateral femoroacetabular and sacroiliac osteoarthritis. ? ?PATIENT SURVEYS:  ?FOTO initial 39/ goal 27 ? ?SCREENING FOR RED FLAGS: ?Bowel or bladder incontinence: No ?Spinal tumors: No ?Cauda equina syndrome: No ?Compression fracture: No ?Abdominal aneurysm: No ? ?COGNITION: ? Overall cognitive status: Within functional limits for tasks assessed   ?  ?SENSATION: ?WFL ? ?POSTURE:  ?Slight forward head/ rounded shoulder posture.  ? ?PALPATION: ?Low thoracic/ lumbar paraspinal muscle tightness.  ? ?4/26: Assessment of LE strength:  ? 5xSTS: 20.29 sec. Age matched norms for age group is 12.6 seconds. ? ?TODAY'S TREATMENT   ?11/20/21:  ? ?There.ex: ? ?Nu-Step L4 for 10 minutes for LE/UE warm up and spinal rotation.  Discussed changing tx. Frequency to 1x/week.   ?  ?Resisted gait 2BTB:  forwards, backwards, R/L walking in

## 2021-11-20 NOTE — Patient Instructions (Addendum)
ICD-10-CM   ?1. Chronic cough  R05.3   ?  ?2. Irritable larynx  J38.7   ?  ?3. Cough variant asthma  J45.991   ?  ? ? ? ?You have a chronic refractory cough [CRC].   ?Vocal cord examination by ENT was normal 05/21/2021 ?Not resolved despite clindamycin coure with ENT ?No resolved despite 1 month of gabapentin with ENT ? ?Recommendations: ?Cotinue current medications ?Check FeNO 11/20/2021 ?Will schedule bronchoscopy with lavage at [facility]  in May 2023 ?If results of bronchoscopy non contributory then consider empitic biologic threapy against asthma  ? ?Follow-up ?- Return in mid-June 2023 to see app or MD to review progress - hopefully by then bronch is complete ?

## 2021-11-20 NOTE — Progress Notes (Signed)
? ? ? ? ?PCP Isaac Ruff, MD ? ?HPI ? ? ? ?IOV  07/08/2017 ? ?Chief Complaint  ?Patient presents with  ? Advice Only  ?  Referred by Dr. Drema Dallas for cough x36month.  Pt states that his cough is  a dry cough. Pt was put on nexium x2 weeks for acid reflux which he states has loosened the mucus some.   ? ? ? ?76[place]  He presents with his wife.  He tells me that mid September 2018 he went to the beach.  Upon return from the beach his granddaughter was sick with a cough and subsequently on April 16, 2017 he abruptly developed a cough.  Since then the cough has persisted.  The only thing that has improved and his cough is that he is no longer having significant night cough although he still does have some  amount of night cough.  This night cough resolved after opioid cough syrup.  Cough is made worse by talking, laughing and also randomly.  It is associated with clearing of the throat.  He feels a tightness in the upper chest.  There is no associated wheezing but there is still some residual nocturnal symptoms.  There is no associated acid reflux.  He has been on Nexium for a week or 2 without much relief.  He has not tried anything for nasal but he denies any nasal discharge.  He is not on any ACE inhibitors.  He does not have a previous history of asthma but his exam nitric oxide today is elevated borderline at 44 ppb.  He does clear the throat.  The cough is annoying.  He tells me that he did have a chest x-ray with his primary care physician that was clear.  The history is obtained from him, talking to his wife and review of the primary care physician referral notes. ? ?Lab review she had a hemoglobin 11.6 g% in February 2016 and a eosinophils f 300 cells.  He is a former smoker ? ?He does not have any shortness of breath.  He walks over a mile.  Sometimes he will cough and a pro-air can help him.  He does find the PDynegyhelps him. ? ?At this  point in time he prefers for conservative simplistic line of treatment ? ? ?OV 08/18/2017 ? ?Chief Complaint  ?Patient presents with  ? Follow-up  ?  Pt states he has been doing good since last visit. Cough is better.  ? ?Follow-up chronic cough ? ?After last visit he decided to take Asmanex.  He also followed a diet and took Prilosec.  With this the cough is more than 60% better as documented in the RSI cough score.  He does not want to take any more new medications.  He feels prednisone burst helped him a lot in the initial few days.  He is wondering which of these measures have helped him the most.  There are no new issues.  He wants to liberalize his diet which he says is actually helping him with the sugars and with the cough but he would like to be less disciplined about food.  He is willing to continue with Asmanex ? ? ?feno 43 ppb ? ?OV 02/28/2018 ? ?Chief Complaint  ?Patient presents with  ? Follow-up  ?  Pt states things had been doing good for him and did go off of the inhaler but stated after he had been off  of the inhaler for about a week, the cough came back in the mornings and now he states he is coughing all the time. He states he is still not taking the medication and he states he is still coughing.  ? ? ?Follow-up chronic cough ? ?He is here with his wife. He tells me that approximately one month ago because he was feeling well without any cough he stopped his Asmanex and then 2 weeks ago his cough returned. RSI cough score is 17. When he lies downhe has a cough. But he does not wake up in the middle of the night because of cough. There is no associated wheezing or shortness of breath or chest tightness or any change in his health status. Social history: His wife is new diagnosis of liver cirrhosis. She is going MRI today. Exam nitric oxide is in the indeterminate range today for him. Review of the chart indicates last chest x-ray was in 2010. ? ? ? ?OV 05/31/2018 ? ?Subjective:  ?Patient ID: Isaac Weber, male , DOB: 11/15/1945 , age 76 y.o. , MRN: [id number] , ADDRESS: [id number] ?[place] [id number] ? ? ?05/31/2018 -   ?Chief Complaint  ?Patient presents with  ? Follow-up  ?  Doing well at this time.  ? ? ? ?HPI ?Isaac Weber 76 y.o. -presents for follow-up of cough variant asthma.  Last visit we put him back on Asmanex after his cough deteriorated.  This was in August 2019.  With this his cough is significantly improved RSI cough score is 5.  He realizes the value of taking Asmanex.  He takes his Asmanex 1 puff 2 times daily.  The only interim issues that March 09, 2018 he ended up in the ER lab review shows normal.  It was for dizziness.  Socially his wife Fraser Din has had liver cyst surgery at [facility] in October 2019 and is doing well.  He is up-to-date with his flu shot but our internal immunization record shows that he has not had pneumonia vaccine. ? ? ? ? ? ? ? ?OV 02/20/2019 ? ?Subjective:  ?Patient ID: Isaac Weber, male , DOB: 18-Jun-1946 , age 21 y.o. , MRN: [id number] , ADDRESS: [address] ?[place] [address] ? ? ?02/20/2019 -   ?Chief Complaint  ?Patient presents with  ? Cough  ?  Having more trouble with phlegm.  ? ? ? ?HPI ?Isaac Weber 76 y.o. -presents for chronic cough and cough variant asthma.  He is a former smoker 42 pack.  Last chest x-ray was August 2019.  At that time it was clear.  No previous CT scan of the chest.  He tells me that overall he is doing stable although in the last 6 weeks he has had increasing phlegm production.  It is not acute.  He has been socially isolating.  He has not come into contact with anyone with COVID.  He does go to church but mostly does [employer].  His RSI cough score shows significant deterioration as documented below.  The phlegm is not green but more like white. ? ? ? ?OV 06/16/2020 ? ?Subjective:  ?Patient ID: Isaac Weber, male , DOB: 02/25/1946 , age 89 y.o. , MRN: [id number] , ADDRESS: [address] ?[place] [address] ?PCP  Olin Hauser, DO ?Patient Care Team: ?Olin Hauser, DO as PCP - General (Family Medicine) ?Jettie Booze, MD as PCP - Cardiology (Cardiology) ? ?This Provider for this visit:  Treatment Team:  ?Attending Provider: Brand Males, MD ? ? ? ?06/16/2020 -   ?Chief Complaint  ?Patient presents with  ? Follow-up  ?  Pt states he had a flare up with asthma about 1 week ago which turned into pneumonia. Pt states he is now better. Has an occ cough which is better. Denies any complaints of wheezing or increased SOB.  ? ?Follow-up cough variant asthma on Asmanex ?Follow-up left upper lobe nodule and a remote smoker for millimeter August 2020 ?HPI ?Isaac Weber 76 y.o. -presents for follow-up.  Last seen in July 2020.  That was by me myself.  He presents now for follow-up.  He was supposed to go to [place] but because of Covid pandemic the history of his been postponed to March 2022.  He tells me it was the end of October 2021 he developed an asthma flare.  He is unclear why he had a flareup.  No sick contacts.  After that he played golf in [place] and this made it worse.  On number second 2020 when he had a chest x-ray with Dr. Raliegh Ip his primary care physician that showed some infiltrates.  He got antibiotics and prednisone.  This helped him.  Symptoms are improving but now he feels the cough is getting worse again.  He is wondering if it is his chronic cough getting worse.  Overall he continues to be compliant with his Asmanex. ? ? ? ? ? ? ?Lab Results  ?Component Value Date  ? NITRICOXIDE 39 02/28/2018  ? ? ? ?OV 01/16/2021 ? ?Subjective:  ?Patient ID: Isaac Weber, male , DOB: 1946/03/12 , age 65 y.o. , MRN: [id number] , ADDRESS: [id number] ?[place] [id number] ?PCP Olin Hauser, DO ?Patient Care Team: ?Olin Hauser, DO as PCP - General (Family Medicine) ?Jettie Booze, MD as PCP - Cardiology (Cardiology) ? ?This Provider for this visit: Treatment  Team:  ?Attending Provider: Brand Males, MD ? ? ? ?01/16/2021 -   ?Chief Complaint  ?Patient presents with  ? Follow-up  ?  Patient reports that he was on prednisone about 6 months and worse in last 6 mon

## 2021-11-20 NOTE — H&P (View-Only) (Signed)
? ? ? ? ?PCP Leighton Ruff, MD ? ?HPI ? ? ? ?IOV  07/08/2017 ? ?Chief Complaint  ?Patient presents with  ? Advice Only  ?  Referred by Dr. Drema Dallas for cough x80month.  Pt states that his cough is  a dry cough. Pt was put on nexium x2 weeks for acid reflux which he states has loosened the mucus some.   ? ? ? ?76[place]  He presents with his wife.  He tells me that mid September 2018 he went to the beach.  Upon return from the beach his granddaughter was sick with a cough and subsequently on April 16, 2017 he abruptly developed a cough.  Since then the cough has persisted.  The only thing that has improved and his cough is that he is no longer having significant night cough although he still does have some  amount of night cough.  This night cough resolved after opioid cough syrup.  Cough is made worse by talking, laughing and also randomly.  It is associated with clearing of the throat.  He feels a tightness in the upper chest.  There is no associated wheezing but there is still some residual nocturnal symptoms.  There is no associated acid reflux.  He has been on Nexium for a week or 2 without much relief.  He has not tried anything for nasal but he denies any nasal discharge.  He is not on any ACE inhibitors.  He does not have a previous history of asthma but his exam nitric oxide today is elevated borderline at 44 ppb.  He does clear the throat.  The cough is annoying.  He tells me that he did have a chest x-ray with his primary care physician that was clear.  The history is obtained from him, talking to his wife and review of the primary care physician referral notes. ? ?Lab review she had a hemoglobin 11.6 g% in February 2016 and a eosinophils f 300 cells.  He is a former smoker ? ?He does not have any shortness of breath.  He walks over a mile.  Sometimes he will cough and a pro-air can help him.  He does find the PDynegyhelps him. ? ?At this  point in time he prefers for conservative simplistic line of treatment ? ? ?OV 08/18/2017 ? ?Chief Complaint  ?Patient presents with  ? Follow-up  ?  Pt states he has been doing good since last visit. Cough is better.  ? ?Follow-up chronic cough ? ?After last visit he decided to take Asmanex.  He also followed a diet and took Prilosec.  With this the cough is more than 60% better as documented in the RSI cough score.  He does not want to take any more new medications.  He feels prednisone burst helped him a lot in the initial few days.  He is wondering which of these measures have helped him the most.  There are no new issues.  He wants to liberalize his diet which he says is actually helping him with the sugars and with the cough but he would like to be less disciplined about food.  He is willing to continue with Asmanex ? ? ?feno 43 ppb ? ?OV 02/28/2018 ? ?Chief Complaint  ?Patient presents with  ? Follow-up  ?  Pt states things had been doing good for him and did go off of the inhaler but stated after he had been off  of the inhaler for about a week, the cough came back in the mornings and now he states he is coughing all the time. He states he is still not taking the medication and he states he is still coughing.  ? ? ?Follow-up chronic cough ? ?He is here with his wife. He tells me that approximately one month ago because he was feeling well without any cough he stopped his Asmanex and then 2 weeks ago his cough returned. RSI cough score is 17. When he lies downhe has a cough. But he does not wake up in the middle of the night because of cough. There is no associated wheezing or shortness of breath or chest tightness or any change in his health status. Social history: His wife is new diagnosis of liver cirrhosis. She is going MRI today. Exam nitric oxide is in the indeterminate range today for him. Review of the chart indicates last chest x-ray was in 2010. ? ? ? ?OV 05/31/2018 ? ?Subjective:  ?Patient ID: BRONSON BRESSMAN, male , DOB: 01-Oct-1945 , age 76 y.o. , MRN: [id number] , ADDRESS: [address] ?[place] [address] ? ? ?05/31/2018 -   ?Chief Complaint  ?Patient presents with  ? Follow-up  ?  Doing well at this time.  ? ? ? ?HPI ?[name] 76 y.o. -presents for follow-up of cough variant asthma.  Last visit we put him back on Asmanex after his cough deteriorated.  This was in August 2019.  With this his cough is significantly improved RSI cough score is 5.  He realizes the value of taking Asmanex.  He takes his Asmanex 1 puff 2 times daily.  The only interim issues that March 09, 2018 he ended up in the ER lab review shows normal.  It was for dizziness.  Socially his wife Fraser Din has had liver cyst surgery at [facility] in October 2019 and is doing well.  He is up-to-date with his flu shot but our internal immunization record shows that he has not had pneumonia vaccine. ? ? ? ? ? ? ? ?OV 02/20/2019 ? ?Subjective:  ?Patient ID: KAYDEN HUTMACHER, male , DOB: Dec 05, 1945 , age 76 y.o. , MRN: [id number] , ADDRESS: [address] ?[place] [address] ? ? ?02/20/2019 -   ?Chief Complaint  ?Patient presents with  ? Cough  ?  Having more trouble with phlegm.  ? ? ? ?HPI ?[name] 76 y.o. -presents for chronic cough and cough variant asthma.  He is a former smoker 42 pack.  Last chest x-ray was August 2019.  At that time it was clear.  No previous CT scan of the chest.  He tells me that overall he is doing stable although in the last 6 weeks he has had increasing phlegm production.  It is not acute.  He has been socially isolating.  He has not come into contact with anyone with COVID.  He does go to church but mostly does [employer].  His RSI cough score shows significant deterioration as documented below.  The phlegm is not green but more like white. ? ? ? ?OV 06/16/2020 ? ?Subjective:  ?Patient ID: LIMMIE SCHOENBERG, male , DOB: 1946-01-24 , age 76 y.o. , MRN: [id number] , ADDRESS: [address] ?[place] [address] ?PCP  Olin Hauser, DO ?Patient Care Team: ?Olin Hauser, DO as PCP - General (Family Medicine) ?Jettie Booze, MD as PCP - Cardiology (Cardiology) ? ?This Provider for this visit:  Treatment Team:  ?Attending Provider: Brand Males, MD ? ? ? ?06/16/2020 -   ?Chief Complaint  ?Patient presents with  ? Follow-up  ?  Pt states he had a flare up with asthma about 1 week ago which turned into pneumonia. Pt states he is now better. Has an occ cough which is better. Denies any complaints of wheezing or increased SOB.  ? ?Follow-up cough variant asthma on Asmanex ?Follow-up left upper lobe nodule and a remote smoker for millimeter August 2020 ?HPI ?[name] 76 y.o. -presents for follow-up.  Last seen in July 2020.  That was by me myself.  He presents now for follow-up.  He was supposed to go to [place] but because of Covid pandemic the history of his been postponed to March 2022.  He tells me it was the end of October 2021 he developed an asthma flare.  He is unclear why he had a flareup.  No sick contacts.  After that he played golf in [place] and this made it worse.  On number second 2020 when he had a chest x-ray with Dr. Raliegh Ip his primary care physician that showed some infiltrates.  He got antibiotics and prednisone.  This helped him.  Symptoms are improving but now he feels the cough is getting worse again.  He is wondering if it is his chronic cough getting worse.  Overall he continues to be compliant with his Asmanex. ? ? ? ? ? ? ?Lab Results  ?Component Value Date  ? NITRICOXIDE 39 02/28/2018  ? ? ? ?OV 01/16/2021 ? ?Subjective:  ?Patient ID: ANIRUDH BAIZ, male , DOB: 20-Jul-1946 , age 51 y.o. , MRN: [id number] , ADDRESS: [address] ?[place] [address] ?PCP Olin Hauser, DO ?Patient Care Team: ?Olin Hauser, DO as PCP - General (Family Medicine) ?Jettie Booze, MD as PCP - Cardiology (Cardiology) ? ?This Provider for this visit: Treatment  Team:  ?Attending Provider: Brand Males, MD ? ? ? ?01/16/2021 -   ?Chief Complaint  ?Patient presents with  ? Follow-up  ?  Patient reports that he was on prednisone about 6 months and worse in last 6 mon

## 2021-11-23 ENCOUNTER — Encounter: Payer: Self-pay | Admitting: Family Medicine

## 2021-11-25 ENCOUNTER — Ambulatory Visit
Admission: RE | Admit: 2021-11-25 | Discharge: 2021-11-25 | Disposition: A | Discharge status: Home / Self Care | Payer: PPO | Source: Ambulatory Visit | Attending: Student in an Organized Health Care Education/Training Program | Admitting: Student in an Organized Health Care Education/Training Program

## 2021-11-25 ENCOUNTER — Ambulatory Visit (HOSPITAL_BASED_OUTPATIENT_CLINIC_OR_DEPARTMENT_OTHER): Payer: PPO | Admitting: Student in an Organized Health Care Education/Training Program

## 2021-11-25 ENCOUNTER — Telehealth: Payer: Self-pay | Admitting: Internal Medicine

## 2021-11-25 ENCOUNTER — Encounter: Payer: Self-pay | Admitting: Internal Medicine

## 2021-11-25 ENCOUNTER — Encounter: Payer: Self-pay | Admitting: Student in an Organized Health Care Education/Training Program

## 2021-11-25 ENCOUNTER — Encounter: Payer: PPO | Admitting: Physical Therapy

## 2021-11-25 VITALS — BP 134/74 | HR 117 | Temp 98.2°F | Resp 18 | Ht 69.0 in | Wt 164.0 lb

## 2021-11-25 DIAGNOSIS — M5126 Other intervertebral disc displacement, lumbar region: Secondary | ICD-10-CM | POA: Diagnosis not present

## 2021-11-25 DIAGNOSIS — G8929 Other chronic pain: Secondary | ICD-10-CM

## 2021-11-25 DIAGNOSIS — R053 Chronic cough: Secondary | ICD-10-CM

## 2021-11-25 DIAGNOSIS — G894 Chronic pain syndrome: Secondary | ICD-10-CM | POA: Diagnosis not present

## 2021-11-25 DIAGNOSIS — M48061 Spinal stenosis, lumbar region without neurogenic claudication: Secondary | ICD-10-CM | POA: Diagnosis not present

## 2021-11-25 DIAGNOSIS — M5416 Radiculopathy, lumbar region: Secondary | ICD-10-CM | POA: Insufficient documentation

## 2021-11-25 MED ORDER — DEXAMETHASONE SODIUM PHOSPHATE 10 MG/ML IJ SOLN
INTRAMUSCULAR | Status: AC
Start: 2021-11-25 — End: ?
  Filled 2021-11-25: qty 1

## 2021-11-25 MED ORDER — SODIUM CHLORIDE 0.9% FLUSH
2.0000 mL | Freq: Once | INTRAVENOUS | Status: AC
  Administered 2021-11-25: 2 mL

## 2021-11-25 MED ORDER — LIDOCAINE HCL 2 % IJ SOLN
INTRAMUSCULAR | Status: AC
  Filled 2021-11-25: qty 20

## 2021-11-25 MED ORDER — IOHEXOL 180 MG/ML  SOLN
10.0000 mL | Freq: Once | INTRAMUSCULAR | Status: AC
  Administered 2021-11-25: 5 mL via EPIDURAL

## 2021-11-25 MED ORDER — LIDOCAINE HCL 2 % IJ SOLN
20.0000 mL | Freq: Once | INTRAMUSCULAR | Status: AC
  Administered 2021-11-25: 400 mg

## 2021-11-25 MED ORDER — DEXAMETHASONE SODIUM PHOSPHATE 10 MG/ML IJ SOLN
20.0000 mg | Freq: Once | INTRAMUSCULAR | Status: AC
  Administered 2021-11-25: 20 mg
  Filled 2021-11-25: qty 2

## 2021-11-25 MED ORDER — ROPIVACAINE HCL 2 MG/ML IJ SOLN
INTRAMUSCULAR | Status: AC
  Filled 2021-11-25: qty 20

## 2021-11-25 MED ORDER — ROPIVACAINE HCL 2 MG/ML IJ SOLN
2.0000 mL | Freq: Once | INTRAMUSCULAR | Status: AC
  Administered 2021-11-25: 2 mL via EPIDURAL

## 2021-11-25 MED ORDER — SODIUM CHLORIDE (PF) 0.9 % IJ SOLN
INTRAMUSCULAR | Status: AC
  Filled 2021-11-25: qty 10

## 2021-11-25 NOTE — Telephone Encounter (Signed)
Called and spoke with pt making sure he saw the mychart message and he stated that he did receive my message. Nothing further needed. ?

## 2021-11-25 NOTE — Telephone Encounter (Signed)
PATIENT: Isaac Weber ?GENDER: male ?MRN: [id number] ?DOB: 1945/12/27 ?ADDRESS: [address] ?[place] [address] ? ? ? ?Please schedule the following: ? ?Diagnosis: chronic cough ?Procedure: Video bronchocoopy, flexible bronchoscopy with BAL  ?Envisia Classifer Transbronchial biopsy: NO  ?Anesthesia: moderat sedation ?Do you need Fluro? NO ?Size of Scope: small ?Pre-med nebulized lidocaine: yes ?Priority: 11/26/21 7a-7p, 5/17 2p-3p ?Date: above ?Alternate Date: aove  ?Time: AMabove/ PMabove ?Location: [place] ?Does patient have OSA? no DM? no Or Latex allergy? no ?Medication Restriction: stop antiocogaulaton ?Anticoagulate/Antiplatelet: stop if one it ?Pre-op Labs Ordered: none ?Imaging request: none   ? ? ? ? ?MISCELLANEOUS KEY INSTRUCTIONS  ? ? ?Please coordinate Pre-op COVID Testing  ? ?Please let Dr Chase Caller know via reply phone message on Epic ? ?Thank you ? ? ? ? ?Key patient medical info  ? ? ? ?Allergy History:  ?Allergies  ?Allergen Reactions  ? Morphine And Related Diarrhea and Nausea And Vomiting  ? ? ? ?Current Outpatient Medications:  ?  albuterol (VENTOLIN HFA) 108 (90 Base) MCG/ACT inhaler, Inhale 1-2 puffs into the lungs every 4 (four) hours as needed for wheezing or shortness of breath (cough)., Disp: 1 each, Rfl: 2 ?  amLODipine (NORVASC) 5 MG tablet, TAKE ONE TABLET BY MOUTH ONCE DAILY, Disp: 90 tablet, Rfl: 0 ?  baclofen (LIORESAL) 10 MG tablet, Take 0.5-1 tablets (5-10 mg total) by mouth 2 (two) times daily as needed for muscle spasms., Disp: 60 each, Rfl: 2 ?  Blood Glucose Monitoring Suppl (ONE TOUCH ULTRA 2) w/Device KIT, Use to check blood sugar up to 2 times daily, Disp: 1 kit, Rfl: 0 ?  Calcium Carb-Cholecalciferol (CALCIUM + D3 PO), Take 1 tablet by mouth daily., Disp: , Rfl:  ?  diclofenac Sodium (VOLTAREN) 1 % GEL, Apply topically as needed., Disp: , Rfl:  ?  ferrous sulfate 325 (65 FE) MG tablet, Take 325 mg by mouth daily with breakfast., Disp: , Rfl:  ?  fluticasone (FLONASE)  50 MCG/ACT nasal spray, USE ONE SPRAY IN EACH NOSTRIL DAILY, Disp: 48 g, Rfl: 3 ?  Ginger, Zingiber officinalis, (GINGER PO), Take 1 each by mouth daily. Tea, Disp: , Rfl:  ?  Glucosamine-Chondroitin (GLUCOSAMINE CHONDR COMPLEX PO), Take 1 capsule by mouth 2 (two) times daily., Disp: , Rfl:  ?  hydrochlorothiazide (HYDRODIURIL) 12.5 MG tablet, TAKE ONE TABLET BY MOUTH ONCE DAILY, Disp: 90 tablet, Rfl: 0 ?  ibuprofen (ADVIL) 800 MG tablet, Take 1 tablet (800 mg total) by mouth 3 (three) times daily., Disp: 90 tablet, Rfl: 0 ?  ketoconazole (NIZORAL) 2 % shampoo, SHAMPOO INTO SCALP, LET SIT FOR 10 MINUTES THEN WASH OFF. USE 3 TIMES PER WEEK, Disp: 120 mL, Rfl: 3 ?  Lancets (ONETOUCH ULTRASOFT) lancets, Use to check blood sugar up to twice per day, Disp: 200 each, Rfl: 5 ?  loratadine (CLARITIN) 10 MG tablet, Take 10 mg by mouth daily., Disp: , Rfl:  ?  losartan (COZAAR) 50 MG tablet, TAKE ONE TABLET BY MOUTH ONCE DAILY, Disp: 90 tablet, Rfl: 0 ?  metFORMIN (GLUCOPHAGE-XR) 500 MG 24 hr tablet, TAKE ONE TABLET BY MOUTH EVERYDAY AT BEDTIME, Disp: 90 tablet, Rfl: 0 ?  Mometasone Furoate (ASMANEX HFA) 200 MCG/ACT AERO, Inhale 2 puffs into the lungs in the morning and at bedtime., Disp: 13 g, Rfl: 11 ?  Multiple Vitamin (MULTIVITAMIN WITH MINERALS) TABS tablet, Take 1 tablet by mouth daily., Disp: , Rfl:  ?  ONETOUCH ULTRA test strip, Use to check blood sugar  up to twice per day, Disp: 200 each, Rfl: 5 ?  rosuvastatin (CRESTOR) 5 MG tablet, TAKE ONE TABLET BY MOUTH ONCE DAILY, Disp: 90 tablet, Rfl: 1 ?  tamsulosin (FLOMAX) 0.4 MG CAPS capsule, Take 1 capsule (0.4 mg total) by mouth daily., Disp: 90 capsule, Rfl: 3 ? ? has a past medical history of Abnormal chest CT, Allergic rhinitis, unspecified, Anemia, Aortic atherosclerosis ([place]), BPH (benign prostatic hyperplasia), COPD (chronic obstructive pulmonary disease) ([place]), Coronary artery disease, Cough variant asthma, Glaucoma, Hypertension, Other chronic pain, Pancreatic  lesion, Pure hypercholesterolemia, unspecified, SCC (squamous cell carcinoma) (03/10/2021), SCC (squamous cell carcinoma) (03/10/2021), 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 ? ? ? has a past surgical history that includes Knee arthroscopy (Right, 1991); Bunionectomy (Left, 2003); Prostate surgery (2002); Rotator cuff repair (Left, 1999); Cervical disc surgery; Leg Surgery (Left); Rotator cuff repair (Right, 1996); Green light laser turp (transurethral resection of prostate (2010); Lumbar disc surgery (2010); Bunionectomy with hammertoe reconstruction (Left, 2013); Lumbar disc surgery (2011); Back surgery; and Colonoscopy with propofol (N/A, 04/30/2021). ? ? ?SIGNATURE  ? ? ?Dr. Brand Males, M.D., F.C.C.P,  ?Pulmonary and Critical Care Medicine ?Staff Physician, [place] ?Center Director - Interstitial Lung Disease  Program  ?Pulmonary [facility] ?[place], [place], [address] ? ?Pager: [phone], If no answer or between  15:00h - 7:00h: call [phone] ?Telephone: [phone] ? ?10:47 AM ?11/25/2021 ? ? ?

## 2021-11-25 NOTE — Telephone Encounter (Signed)
Isaac Weber, [facility] tried to call pt to go over bronch instructions but was not able to reach him. I have sent pt a mychart message with the instructions. Will keep an eye out for him to read and respond. ?

## 2021-11-25 NOTE — Telephone Encounter (Signed)
Mychart message sent by pt: ?Isaac Weber "[name]"  P [facility] (supporting Brand Males, MD) 1 hour ago (9:36 AM)  ? ?I was with Doctor 04:30 PM on Friday afternoon. He told me he was going to schedule a Bronchoscopy at [facility] sometime in May, I realize this coordination takes time and I appreciate that but my Wife is a basket case, my coughing and phlegm seem to be getting worse, I believe she thinks I won't come out of one of these coughing spells. I meaning we want too make sure since that since it was late on a Friday that nothing  has falling through the cracks. So sorry for not being patience,  but I feel for what my wife is going through,  I'm having an Epidural today at 1 o'clock in my L4 & L5 disc, this just adds to her stress. Thanks for understanding . ?

## 2021-11-25 NOTE — Telephone Encounter (Signed)
PRocedure scheduled for 14.30 tomorrw 11/26/21. He needs to be there 1h before . NPO from 6am. Take all meds but not the diabetic medss And other standard instructions please let him know ?

## 2021-11-25 NOTE — Telephone Encounter (Signed)
Cpt code has been given. Nothing further needed at this time ?

## 2021-11-25 NOTE — Progress Notes (Signed)
Called [place] [facility]. Asked case for 11/26/21 Order placed. Also sent message to triage pool ?

## 2021-11-25 NOTE — Progress Notes (Signed)
PROVIDER NOTE: Interpretation of information contained herein should be left to medically-trained personnel. Specific patient instructions are provided elsewhere under "Patient Instructions" section of medical record. This document was created in part using STT-dictation technology, any transcriptional errors that may result from this process are unintentional.  ?Patient: Isaac Weber ?Type: Established ?DOB: 08-Jul-1946 ?MRN: [id number] ?PCP: Olin Hauser, DO  Service: Procedure ?DOS: 11/25/2021 ?Setting: Ambulatory ?Location: Ambulatory outpatient facility ?Delivery: Face-to-face Provider: Gillis Santa, MD ?Specialty: Interventional Pain Management ?Specialty designation: 09 ?Location: Outpatient facility ?Ref. Prov.: Nobie Putnam *   ? ?Primary Reason for Visit: Interventional Pain Management Treatment. ?CC: Back Pain (Right, lower) ?  ?Procedure:          ? Type: Trans-Foraminal Epidural Steroid Injection (Lumbar) #1  ?Laterality: Right  ?Level: L4 & L5  ?Imaging: Fluoroscopic guidance ?Anesthesia: Local anesthesia (1-2% Lidocaine) ?Anxiolysis: None                 ?Sedation: None. ?DOS: 11/25/2021  ?Performed by: Gillis Santa, MD ? ?Purpose: Diagnostic/Therapeutic ?Indications: Lumbar radicular pain severe enough to impact quality of life or function. ?1. 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 Chronic radicular lumbar pain   ?3. Lumbar radiculopathy   ?4. 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 Chronic pain syndrome   ? ?NAS-11 Pain score:  ? Pre-procedure: 2 /10  ? Post-procedure: 4 /10  ? ?  ?Position / Prep / Materials:  ?Position: Prone  ?Prep solution: DuraPrep (Iodine Povacrylex [0.7% available iodine] and Isopropyl Alcohol, 74% w/w) ?Prep Area: Entire Posterior Lumbosacral Area.  From the lower tip of the scapula down to the tailbone and from flank to flank. ?Materials:  ?Tray: Block ?Needle(s):  ?Type: Spinal  ?Gauge (G): 22  ?Length: 3.5-in   ?Qty: 2 ? ?Pre-op H&P Assessment:  ?Isaac Weber is a 76 y.o. (year old), male patient, seen today for interventional treatment. He  has a past surgical history that includes Knee arthroscopy (Right, 1991); Bunionectomy (Left, 2003); Prostate surgery (2002); Rotator cuff repair (Left, 1999); Cervical disc surgery; Leg Surgery (Left); Rotator cuff repair (Right, 1996); Green light laser turp (transurethral resection of prostate (2010); Lumbar disc surgery (2010); Bunionectomy with hammertoe reconstruction (Left, 2013); Lumbar disc surgery (2011); Back surgery; and Colonoscopy with propofol (N/A, 04/30/2021). Isaac Weber has a current medication list which includes the following prescription(s): albuterol, amlodipine, baclofen, one touch ultra 2, ferrous sulfate, fluticasone, gabapentin, glucosamine-chondroitin, hydrochlorothiazide, ibuprofen, ketoconazole, onetouch ultrasoft, loratadine, losartan, metformin, asmanex hfa, multiple minerals-vitamins, multivitamin with minerals, onetouch ultra, rosuvastatin, and tamsulosin. His primarily concern today is the Back Pain (Right, lower) ? ?Initial Vital Signs:  ?Pulse/HCG Rate:  ?(!) 117 ?ECG Heart Rate: (!) 106 ?Temp: 98.2 ?F (36.8 ?C) ?Resp: 14 ?BP:  ?(!) 154/81 ?SpO2: 98 % ? ?BMI: Estimated body mass index is 24.22 kg/m? as calculated from the following: ?  Height as of this encounter: '5\' 9"'$  (1.753 m). ?  Weight as of this encounter: 164 lb (74.4 kg). ? ?Risk Assessment: ?Allergies: Reviewed. He is allergic to morphine and related.  ?Allergy Precautions: None required ?Coagulopathies: Reviewed. None identified.  ?Blood-thinner therapy: None at this time ?Active Infection(s): Reviewed. None identified. Isaac Weber is afebrile ? ?Site Confirmation: Isaac Weber was asked to confirm the procedure and laterality before marking the site ?Procedure checklist: Completed ?Consent: Before the procedure and under the influence of no sedative(s), amnesic(s), or anxiolytics, the patient  was informed of the treatment options, risks and possible  complications. To fulfill our ethical and legal obligations, as recommended by the American Medical Association's Code of Ethics, I have informed the patient of my clinical impression; the nature and purpose of the treatment or procedure; the risks, benefits, and possible complications of the intervention; the alternatives, including doing nothing; the risk(s) and benefit(s) of the alternative treatment(s) or procedure(s); and the risk(s) and benefit(s) of doing nothing. ?The patient was provided information about the general risks and possible complications associated with the procedure. These may include, but are not limited to: failure to achieve desired goals, infection, bleeding, organ or nerve damage, allergic reactions, paralysis, and death. ?In addition, the patient was informed of those risks and complications associated to Spine-related procedures, such as failure to decrease pain; infection (i.e.: Meningitis, epidural or intraspinal abscess); bleeding (i.e.: epidural hematoma, subarachnoid hemorrhage, or any other type of intraspinal or peri-dural bleeding); organ or nerve damage (i.e.: Any type of peripheral nerve, nerve root, or spinal cord injury) with subsequent damage to sensory, motor, and/or autonomic systems, resulting in permanent pain, numbness, and/or weakness of one or several areas of the body; allergic reactions; (i.e.: anaphylactic reaction); and/or death. ?Furthermore, the patient was informed of those risks and complications associated with the medications. These include, but are not limited to: allergic reactions (i.e.: anaphylactic or anaphylactoid reaction(s)); adrenal axis suppression; blood sugar elevation that in diabetics may result in ketoacidosis or comma; water retention that in patients with history of congestive heart failure may result in shortness of breath, pulmonary edema, and decompensation with resultant heart  failure; weight gain; swelling or edema; medication-induced neural toxicity; particulate matter embolism and blood vessel occlusion with resultant organ, and/or nervous system infarction; and/or aseptic necrosis of one or more joints. ?Finally, the patient was informed that Medicine is not an exact science; therefore, there is also the possibility of unforeseen or unpredictable risks and/or possible complications that may result in a catastrophic outcome. The patient indicated having understood very clearly. We have given the patient no guarantees and we have made no promises. Enough time was given to the patient to ask questions, all of which were answered to the patient's satisfaction. Mr. Finken has indicated that he wanted to continue with the procedure. ?Attestation: I, the ordering provider, attest that I have discussed with the patient the benefits, risks, side-effects, alternatives, likelihood of achieving goals, and potential problems during recovery for the procedure that I have provided informed consent. ?Date  Time: 11/25/2021 12:44 PM ? ?Pre-Procedure Preparation:  ?Monitoring: As per clinic protocol. Respiration, ETCO2, SpO2, BP, heart rate and rhythm monitor placed and checked for adequate function ?Safety Precautions: Patient was assessed for positional comfort and pressure points before starting the procedure. ?Time-out: I initiated and conducted the "Time-out" before starting the procedure, as per protocol. The patient was asked to participate by confirming the accuracy of the "Time Out" information. Verification of the correct person, site, and procedure were performed and confirmed by me, the nursing staff, and the patient. "Time-out" conducted as per Joint Commission's Universal Protocol (UP.01.01.01). ?Time: 2297 ? ?Description/Narrative of Procedure:          ?Target: The 6 o'clock position under the pedicle, on the affected side. ?Region: Posterolateral Lumbosacral ?Approach: Posterior  Percutaneous Paravertebral approach. ? ?Rationale (medical necessity): procedure needed and proper for the diagnosis and/or treatment of the patient's medical symptoms and needs. ?Procedural Technique Safety Preca

## 2021-11-25 NOTE — Progress Notes (Signed)
Safety precautions to be maintained throughout the outpatient stay will include: orient to surroundings, keep bed in low position, maintain call bell within reach at all times, provide assistance with transfer out of bed and ambulation.  

## 2021-11-25 NOTE — Patient Instructions (Signed)

## 2021-11-26 ENCOUNTER — Other Ambulatory Visit: Payer: Self-pay

## 2021-11-26 ENCOUNTER — Ambulatory Visit: Payer: PPO | Admitting: Urology

## 2021-11-26 ENCOUNTER — Encounter (HOSPITAL_COMMUNITY)
Admission: RE | Disposition: A | Discharge status: Home / Self Care | Payer: Self-pay | Source: Home / Self Care | Attending: Internal Medicine

## 2021-11-26 ENCOUNTER — Telehealth: Payer: Self-pay | Admitting: *Deleted

## 2021-11-26 ENCOUNTER — Ambulatory Visit (HOSPITAL_COMMUNITY)
Admission: RE | Admit: 2021-11-26 | Discharge: 2021-11-26 | Disposition: A | Discharge status: Home / Self Care | Payer: PPO | Attending: Internal Medicine | Admitting: Internal Medicine

## 2021-11-26 ENCOUNTER — Encounter (HOSPITAL_COMMUNITY): Payer: Self-pay | Admitting: Internal Medicine

## 2021-11-26 DIAGNOSIS — R053 Chronic cough: Secondary | ICD-10-CM | POA: Insufficient documentation

## 2021-11-26 DIAGNOSIS — Z87891 Personal history of nicotine dependence: Secondary | ICD-10-CM | POA: Diagnosis not present

## 2021-11-26 DIAGNOSIS — Z20822 Contact with and (suspected) exposure to covid-19: Secondary | ICD-10-CM | POA: Insufficient documentation

## 2021-11-26 DIAGNOSIS — I444 Left anterior fascicular block: Secondary | ICD-10-CM | POA: Diagnosis not present

## 2021-11-26 HISTORY — PX: BRONCHIAL WASHINGS: SHX5105

## 2021-11-26 HISTORY — PX: VIDEO BRONCHOSCOPY: SHX5072

## 2021-11-26 LAB — GLUCOSE, CAPILLARY: Glucose-Capillary: 194 mg/dL — ABNORMAL HIGH (ref 70–99)

## 2021-11-26 LAB — BODY FLUID CELL COUNT WITH DIFFERENTIAL
Eos, Fluid: 0 %
Lymphs, Fluid: 2 %
Monocyte-Macrophage-Serous Fluid: 7 % — ABNORMAL LOW (ref 50–90)
Neutrophil Count, Fluid: 91 % — ABNORMAL HIGH (ref 0–25)
Total Nucleated Cell Count, Fluid: 4050 cu mm — ABNORMAL HIGH (ref 0–1000)

## 2021-11-26 LAB — RESP PANEL BY RT-PCR (FLU A&B, COVID) ARPGX2
Influenza A by PCR: NEGATIVE
Influenza B by PCR: NEGATIVE
SARS Coronavirus 2 by RT PCR: NEGATIVE

## 2021-11-26 SURGERY — VIDEO BRONCHOSCOPY WITHOUT FLUORO
Anesthesia: Moderate Sedation

## 2021-11-26 MED ORDER — FENTANYL CITRATE (PF) 100 MCG/2ML IJ SOLN
INTRAMUSCULAR | Status: DC | PRN
  Administered 2021-11-26 (×2): 25 ug via INTRAVENOUS
  Administered 2021-11-26: 50 ug via INTRAVENOUS

## 2021-11-26 MED ORDER — PHENYLEPHRINE HCL 0.25 % NA SOLN: 1.0000 | Freq: Four times a day (QID) | NASAL | Status: DC | PRN

## 2021-11-26 MED ORDER — MIDAZOLAM HCL (PF) 5 MG/ML IJ SOLN
INTRAMUSCULAR | Status: AC
  Filled 2021-11-26: qty 2

## 2021-11-26 MED ORDER — PHENYLEPHRINE HCL 0.5 % NA SOLN: NASAL | Status: DC | PRN

## 2021-11-26 MED ORDER — MIDAZOLAM HCL (PF) 10 MG/2ML IJ SOLN
INTRAMUSCULAR | Status: DC | PRN
  Administered 2021-11-26: 1 mg via INTRAVENOUS
  Administered 2021-11-26: 2 mg via INTRAVENOUS

## 2021-11-26 MED ORDER — OXYMETAZOLINE HCL 0.05 % NA SOLN
NASAL | Status: DC | PRN
  Administered 2021-11-26: 2

## 2021-11-26 MED ORDER — LIDOCAINE HCL URETHRAL/MUCOSAL 2 % EX GEL: 1.0000 "application " | Freq: Once | CUTANEOUS | Status: DC

## 2021-11-26 MED ORDER — LIDOCAINE HCL URETHRAL/MUCOSAL 2 % EX GEL
CUTANEOUS | Status: AC
Start: 2021-11-26 — End: ?
  Filled 2021-11-26: qty 30

## 2021-11-26 MED ORDER — LACTATED RINGERS IV SOLN
INTRAVENOUS | Status: AC | PRN
  Administered 2021-11-26: 1000 mL via INTRAVENOUS

## 2021-11-26 MED ORDER — LIDOCAINE HCL 1 % IJ SOLN
INTRAMUSCULAR | Status: AC
  Filled 2021-11-26: qty 1

## 2021-11-26 MED ORDER — LIDOCAINE HCL 1 % IJ SOLN
INTRAMUSCULAR | Status: DC | PRN
  Administered 2021-11-26: 6 mL

## 2021-11-26 MED ORDER — FENTANYL CITRATE (PF) 100 MCG/2ML IJ SOLN
INTRAMUSCULAR | Status: AC
  Filled 2021-11-26: qty 4

## 2021-11-26 MED ORDER — PHENYLEPHRINE HCL 1 % NA SOLN
NASAL | Status: AC
  Filled 2021-11-26: qty 15

## 2021-11-26 MED ORDER — LIDOCAINE HCL URETHRAL/MUCOSAL 2 % EX GEL
CUTANEOUS | Status: DC | PRN
  Administered 2021-11-26: 1

## 2021-11-26 MED ORDER — LIDOCAINE HCL (PF) 4 % IJ SOLN
INTRAMUSCULAR | Status: AC
  Filled 2021-11-26: qty 5

## 2021-11-26 MED ORDER — LIDOCAINE HCL (PF) 4 % IJ SOLN
INTRAMUSCULAR | Status: DC | PRN
  Administered 2021-11-26: 4 mL via RESPIRATORY_TRACT

## 2021-11-26 NOTE — Interval H&P Note (Signed)
Chronic cough ? ?S: Got epidrual yesterday and back pain better. Otherwise cough + and no interim changes ? ?Object ? ?  11/26/2021  ?  1:51 PM 11/25/2021  ?  1:18 PM 11/25/2021  ?  1:14 PM  ?Vitals with BMI  ?Height '5\' 9"'$     ?Weight 166 lbs    ?BMI 24.5    ?Systolic 242 353 614  ?Diastolic 93 74 74  ?Pulse 108    ? ? ?Exam: CTA bilaterally, Normal heart sounds. ABd soft. No cyanosis. No clubbing. No edema ? ?A/P ?Chronic cough ? ?Plan ? - Risks of pneumothorax, hemothorax, sedation/anesthesia complications such as cardiac or respiratory arrest or hypotension, stroke and bleeding all explained. Benefits of diagnosis but limitations of non-diagnosis also explained. Patient verbalized understanding and wished to proceed.  ? ? ? ?SIGNATURE  ? ? ?Dr. Brand Males, M.D., F.C.C.P,  ?Pulmonary and Critical Care Medicine ?Staff Physician, [place] ?Center Director - Interstitial Lung Disease  Program  ?Medical Director - [facility] ?Pulmonary [place] [facility] ?[place], [place], [address] ? ?NPI Number:  NPI #[id number] ?DEA Number: [id number] ? ?Pager: [phone], If no answer  -> Check AMION or Try [phone] ?Telephone (clinical office): [phone] ?Telephone (research): [phone] ? ?2:03 PM ?11/26/2021 ? ?

## 2021-11-26 NOTE — Telephone Encounter (Signed)
Attempted to call for post procedure follow-up. Message left. 

## 2021-11-26 NOTE — Discharge Instructions (Signed)
BRONCHOSCOPY DISCHARGE INSTRUCTIONS ? ?- Please have someone to drive you home ?- Please be careful with activities for next 24 hours ?- You can eat 2-4 hours after getting home provided you are fully alert, able to cough, and are not nauseated or vomiting and     feel well ?- You are expected to have low grade fever or cough some amount of blood for next 24-48 hours; if this worsens call us ?- If you are very short of breath or coughing blood or chest pain or not feeling well, call us [phone] anytime or go to emergency room ?- For followup appointment - see below. If not there please call [phone] to make an appointment with Dr Chase Caller or nurse practitioner ? ? ?Future Appointments  ?Date Time Provider [place]  ?12/01/2021 10:30 AM Pura Spice, PT [facility] None  ?12/08/2021 10:30 AM Pura Spice, PT [facility] None  ?12/14/2021 10:40 AM [place] Devonne Doughty, DO [facility]  ?12/15/2021 10:30 AM Clementeen Hoof Rebeca Alert, PT [facility] None  ?12/22/2021 10:30 AM Pura Spice, PT [facility] None  ?12/24/2021  9:30 AM Ralene Bathe, MD [facility] None  ?12/24/2021  4:00 PM Gillis [place], MD [facility] None  ?01/07/2022 11:00 AM Martyn Ehrich, NP [facility] None  ?04/16/2022  9:00 AM [place] ADVISOR [place]  ? ? ?

## 2021-11-26 NOTE — Op Note (Signed)
Name:  Isaac Weber ?MRN:  [id number] ?DOB:  1946/03/24 ? ?PROCEDURE NOTE ? ?Procedure(s): ?Flexible bronchoscopy (05397) ?Bronchial alveolar lavage (67341) of the Right Lower Lobe ? ? ?Indications:  Chronic cough ? ?Consent:  Procedure, benefits, risks and alternatives discussed.  Questions answered.  Consent obtained. ? ?Anesthesia:  Moderate Sedation ? ?Location: [place] ? ?Procedure summary:  Appropriate equipment was assembled.  The patient was brought to the procedure suite room and identified as Isaac Weber with 11/13/1945 ? Safety timeout was performed. The patient was placed supine on the  table, airway and moderate sedation administered by this operator. Pr procedure nebulized lidocaine and lidocaine jelly applied.  ? ?After the appropriate level of moderation was assured, flexible video bronchoscope was lubricated and inserted through the right naris . Topical lidocaine applied on cords, carina and Rlight Lower lobe. Despite this he had significant cough ? ?Airway examination was NOT performed bilaterally to subsegmental level due to severe coug., Access to RLL only was done. The RMB and BI and RLL was full of grey mucus that was thick and difficulty to suction. This seemed like a bilatera process. . NO endobronchial lesions were identified. ? ?Bronchial alveolar lavage of the Right loweer lobe after removal of mucus was down ewith 40cc x2. Total return of 30cc pink greey fluid returned.  He had severe cough, epistaxis and tachycardia (sinus) HR 141 and hypertension with cough sbp 188. Procedure terminated at this time ? ?After hemostasis was assure, the bronchoscope was withdrawn. ? ?The patient was recovered and then  transferred to recovery area ? ?Post-procedure chest x-ray was NOT ordered. ? ?Specimens sent: ?Bronchial alveolar lavage specimen of the RLL for cell count , microbiology and cytology. ? ?Complications:  No immediate complications were noted.  Hemodynamic parameters and  oxygenation remained stable throughout the procedure. ? ?Estimated blood loss:  NONE ? ?IMPRESSION ?1. Mucus in airway ?2. RLL BAL ?3. SEvere cough with procedure c/w chronic cough ? ?Followup ?Future Appointments  ?Date Time Provider [place]  ?12/01/2021 10:30 AM Pura Spice, PT [facility] None  ?12/08/2021 10:30 AM Pura Spice, PT [facility] None  ?12/14/2021 10:40 AM [place] Ranger Devonne Doughty, DO [facility]  ?12/15/2021 10:30 AM Clementeen Hoof Rebeca Alert, PT [facility] None  ?12/22/2021 10:30 AM Pura Spice, PT [facility] None  ?12/24/2021  9:30 AM Ralene Bathe, MD [facility] None  ?12/24/2021  4:00 PM Gillis [place], MD [facility] None  ?01/07/2022 11:00 AM Martyn Ehrich, NP [facility] None  ?04/16/2022  9:00 AM [place] ADVISOR [place]  ? ? ? ?Dr. Brand Males, M.D., F.C.C.P ?Pulmonary and Critical Care Medicine ?Staff Physician ?[facility] ?[facility] ?Pager: [phone], If no answer or between  15:00h - 7:00h: call [phone] ? ?11/26/2021 ?3:15 PM ? ? ? ? ?

## 2021-11-27 ENCOUNTER — Encounter: Payer: Self-pay | Admitting: Family Medicine

## 2021-11-27 ENCOUNTER — Encounter (HOSPITAL_COMMUNITY): Payer: Self-pay | Admitting: Internal Medicine

## 2021-11-27 ENCOUNTER — Other Ambulatory Visit: Payer: Self-pay | Admitting: Family Medicine

## 2021-11-27 DIAGNOSIS — E1169 Type 2 diabetes mellitus with other specified complication: Secondary | ICD-10-CM

## 2021-11-27 LAB — ACID FAST SMEAR (AFB, MYCOBACTERIA): Acid Fast Smear: NEGATIVE

## 2021-11-27 LAB — PNEUMOCYSTIS JIROVECI SMEAR BY DFA: Pneumocystis jiroveci Ag: NEGATIVE

## 2021-11-30 ENCOUNTER — Telehealth: Payer: Self-pay | Admitting: Internal Medicine

## 2021-11-30 DIAGNOSIS — R053 Chronic cough: Secondary | ICD-10-CM

## 2021-11-30 LAB — MTB-RIF NAA WITH AFB CULTURE, SPUTUM

## 2021-11-30 LAB — CYTOLOGY - NON PAP

## 2021-11-30 NOTE — Telephone Encounter (Signed)
?He is growing 3 bacteria from the lavage. Highly unusual ? ?Plan ? - check blood for IgG, IgA, IgM, HIV, for any immuneglobulin deficiency, quantiferon gold ?- check blood aNA, DS DNA, RF,CCP, ANCA profile ?- sendikng to [facility] - might need IV picc placed as putpatient and do outpaitent IV antibiotics ? - based on blood work will refer to ID  ? ? ?Results for orders placed or performed during the hospital encounter of 11/26/21  ?Resp Panel by RT-PCR (Flu A&B, Covid) Nasopharyngeal Swab     Status: None  ? Collection Time: 11/26/21 11:07 AM  ? Specimen: Nasopharyngeal Swab; Nasopharyngeal(NP) swabs in vial transport medium  ?Result Value Ref Range Status  ? SARS Coronavirus 2 by RT PCR NEGATIVE NEGATIVE Final  ?  Comment: (NOTE) ?SARS-CoV-2 target nucleic acids are NOT DETECTED. ? ?The SARS-CoV-2 RNA is generally detectable in upper respiratory ?specimens during the acute phase of infection. The lowest ?concentration of SARS-CoV-2 viral copies this assay can detect is ?138 copies/mL. A negative result does not preclude SARS-Cov-2 ?infection and should not be used as the sole basis for treatment or ?other patient management decisions. A negative result may occur with  ?improper specimen collection/handling, submission of specimen other ?than nasopharyngeal swab, presence of viral mutation(s) within the ?areas targeted by this assay, and inadequate number of viral ?copies(<138 copies/mL). A negative result must be combined with ?clinical observations, patient history, and epidemiological ?information. The expected result is Negative. ? ?Fact Sheet for Patients:  ?EntrepreneurPulse.com.au ? ?Fact Sheet for Healthcare Providers:  ?IncredibleEmployment.be ? ?This test is no t yet approved or cleared by the [place] FDA and  ?has been authorized for detection and/or diagnosis of SARS-CoV-2 by ?FDA under an Emergency Use Authorization (EUA). This EUA will remain  ?in effect (meaning  this test can be used) for the duration of the ?COVID-19 declaration under Section 564(b)(1) of the Act, 21 ?U.S.C.section 360bbb-3(b)(1), unless the authorization is terminated  ?or revoked sooner.  ? ? ?  ? Influenza A by PCR NEGATIVE NEGATIVE Final  ? Influenza B by PCR NEGATIVE NEGATIVE Final  ?  Comment: (NOTE) ?The Xpert Xpress SARS-CoV-2/FLU/RSV plus assay is intended as an aid ?in the diagnosis of influenza from Nasopharyngeal swab specimens and ?should not be used as a sole basis for treatment. Nasal washings and ?aspirates are unacceptable for Xpert Xpress SARS-CoV-2/FLU/RSV ?testing. ? ?Fact Sheet for Patients: ?EntrepreneurPulse.com.au ? ?Fact Sheet for Healthcare Providers: ?IncredibleEmployment.be ? ?This test is not yet approved or cleared by the [place] FDA and ?has been authorized for detection and/or diagnosis of SARS-CoV-2 by ?FDA under an Emergency Use Authorization (EUA). This EUA will remain ?in effect (meaning this test can be used) for the duration of the ?COVID-19 declaration under Section 564(b)(1) of the Act, 21 U.S.C. ?section 360bbb-3(b)(1), unless the authorization is terminated or ?revoked. ? ?Performed at [facility], [place] [address] ?[place], [place] [address] ?  ?Culture, Respiratory w Gram Stain     Status: None (Preliminary result)  ? Collection Time: 11/26/21  3:14 PM  ? Specimen: Bronchoalveolar Lavage; Respiratory  ?Result Value Ref Range Status  ? Specimen Description   Final  ?  BRONCHIAL ALVEOLAR LAVAGE ?Performed at [facility], [place] [address], [place], [place] [address] ?  ? Special Requests   Final  ?  Normal ?Performed at [facility], [place] [address] [place], [place] [address] ?  ? Gram Stain   Final  ?  FEW  WBC PRESENT, PREDOMINANTLY PMN ?FEW GRAM POSITIVE COCCI IN CHAINS ?FEW GRAM NEGATIVE RODS ?  ? Culture   Final  ?  ABUNDANT PSEUDOMONAS AERUGINOSA ?ABUNDANT  STREPTOCOCCUS PNEUMONIAE ?ABUNDANT SERRATIA MARCESCENS ?SUSCEPTIBILITIES TO FOLLOW ?Performed at [facility], [place] [address] [place], [place] [address] ?  ? Report Status PENDING  Incomplete  ? Organism ID, Bacteria PSEUDOMONAS AERUGINOSA  Final  ? Organism ID, Bacteria STREPTOCOCCUS PNEUMONIAE  Final  ?    Susceptibility  ? Pseudomonas aeruginosa - MIC*  ?  CEFTAZIDIME <=1 SENSITIVE Sensitive   ?  CIPROFLOXACIN <=0.25 SENSITIVE Sensitive   ?  GENTAMICIN <=1 SENSITIVE Sensitive   ?  IMIPENEM 2 SENSITIVE Sensitive   ?  CEFEPIME 4 SENSITIVE Sensitive   ?  * ABUNDANT PSEUDOMONAS AERUGINOSA  ? Streptococcus pneumoniae - MIC*  ?  ERYTHROMYCIN <=0.12 SENSITIVE Sensitive   ?  LEVOFLOXACIN 0.5 SENSITIVE Sensitive   ?  VANCOMYCIN 0.25 SENSITIVE Sensitive   ?  PENICILLIN (meningitis) <=0.06 SENSITIVE Sensitive   ?  PENO - penicillin <=0.06    ?  PENICILLIN (non-meningitis) <=0.06 SENSITIVE Sensitive   ?  PENICILLIN (oral) <=0.06 SENSITIVE Sensitive   ?  CEFTRIAXONE (non-meningitis) <=0.12 SENSITIVE Sensitive   ?  CEFTRIAXONE (meningitis) <=0.12 SENSITIVE Sensitive   ?  * ABUNDANT STREPTOCOCCUS PNEUMONIAE  ?Acid Fast Smear (AFB)     Status: None  ? Collection Time: 11/26/21  3:14 PM  ? Specimen: Bronchoalveolar Lavage; Respiratory  ?Result Value Ref Range Status  ? AFB Specimen Processing Concentration  Final  ? Acid Fast Smear Negative  Final  ?  Comment: (NOTE) ?Performed At: [facility] ?[address] [place], [place] [id number] ?Rush Farmer MD DX:[id number] ?  ? Source (AFB) BRONCHIAL ALVEOLAR LAVAGE  Final  ?  Comment: Performed at [facility], [place] [address] [place], [place] [address]  ?Pneumocystis smear by DFA     Status: None  ? Collection Time: 11/26/21  3:14 PM  ? Specimen: Bronchoalveolar Lavage; Respiratory  ?Result Value Ref Range Status  ? Specimen Source-[facility] BRONCHIAL ALVEOLAR LAVAGE  Final  ? Pneumocystis jiroveci Ag NEGATIVE  Final  ?  Comment: Performed at [place] [facility] ?Performed at [facility], [place] [address] [place], [place] [address] ?  ?MTB-RIF NAA with AFB Culture, sputum     Status: None (Preliminary result)  ? Collection Time: 11/26/21  3:15 PM  ? Specimen: Bronchoalveolar Lavage; Sputum  ?Result Value Ref Range Status  ? Myco tuberculosis Complex Comment NOT DETECTED Final  ?  Comment: Mycobacterium tuberculosis complex (MTBC) NOT detected.  ? Rifampin Comment Susceptible Final  ?  Comment: (NOTE) ?Because Mycobacterium tuberculosis complex (MTBC) was not detected, ?no rifampin determination is possible. ?  ? AFB Specimen Processing Concentration  Final  ?  Comment: (NOTE) ?Performed At: [facility] ?[address] [place], [place] [id number] ?Rush Farmer MD [id number] ?  ? Acid Fast Culture PENDING  Incomplete  ? Source (MTB RIF) BRONCHIAL ALVEOLAR LAVAGE  Final  ?  Comment: Performed at [facility], [place] [address], [place], [place] [address]  ? ? ?

## 2021-12-01 ENCOUNTER — Other Ambulatory Visit: Payer: PPO

## 2021-12-01 ENCOUNTER — Encounter: Payer: Self-pay | Admitting: Physical Therapy

## 2021-12-01 ENCOUNTER — Ambulatory Visit: Payer: PPO | Attending: Family Medicine | Admitting: Physical Therapy

## 2021-12-01 DIAGNOSIS — M47817 Spondylosis without myelopathy or radiculopathy, lumbosacral region: Secondary | ICD-10-CM | POA: Insufficient documentation

## 2021-12-01 DIAGNOSIS — R053 Chronic cough: Secondary | ICD-10-CM

## 2021-12-01 DIAGNOSIS — M6281 Muscle weakness (generalized): Secondary | ICD-10-CM | POA: Insufficient documentation

## 2021-12-01 DIAGNOSIS — M249 Joint derangement, unspecified: Secondary | ICD-10-CM | POA: Insufficient documentation

## 2021-12-01 LAB — CULTURE, RESPIRATORY W GRAM STAIN: Special Requests: NORMAL

## 2021-12-01 LAB — FUNGUS CULTURE WITH STAIN

## 2021-12-01 NOTE — Telephone Encounter (Signed)
Called and spoke with pt letting him know info per MR about recent bronch and he verbalized understanding. Stated to him that we needed to have him come to get some bloodwork done and also that we needed to move his appt up sooner with BW. ? ?Pt will be coming in today 5/9 for bloodwork and coming in tomorrow 5/10 for appt with BW. Did mention to him about IV abx but said that all this would be further discussed with him during OV 5/10 and he verbalized understanding. Nothing further needed. ?

## 2021-12-01 NOTE — Therapy (Signed)
?OUTPATIENT PHYSICAL THERAPY THORACOLUMBAR TREATMENT ?Physical Therapy Progress Note ? ? ?Dates of reporting period  10/22/21  to  12/01/21  ? ?Patient Name: Isaac Weber ?MRN: [id number] ?DOB:1945/10/30, 76 y.o., male ?Today's Date: 12/01/2021 ? ? PT End of Session - 12/01/21 1238   ? ? Visit Number 10   ? Number of Visits 16   ? Date for PT Re-Evaluation 12/16/21   ? PT Start Time 1024   ? PT Stop Time 1114   ? PT Time Calculation (min) 50 min   ? Activity Tolerance Patient tolerated treatment well   ? Behavior During Therapy [facility] for tasks assessed/performed   ? ?  ?  ? ?  ? ? ? ?Past Medical History:  ?Diagnosis Date  ? Abnormal chest CT   ? Allergic rhinitis, unspecified   ? Anemia   ? Aortic atherosclerosis ([place])   ? BPH (benign prostatic hyperplasia)   ? COPD (chronic obstructive pulmonary disease) ([place])   ? Coronary artery disease   ? Cough variant asthma   ? Glaucoma   ? Hypertension   ? Other chronic pain   ? Pancreatic lesion   ? Pure hypercholesterolemia, unspecified   ? 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 R upper forearm, ED[facility]  ? 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 R medial lower pretibial, [facility]  ? 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 left medial lower leg above medial ankle; SCC/KA type. Tx: [facility]  ? 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 Right neck proximal mandible. WD SCC, ulcerated. [facility] 04/29/2020  ? ?Past Surgical History:  ?Procedure Laterality Date  ? BACK SURGERY    ? BRONCHIAL WASHINGS  11/26/2021  ? Procedure: BRONCHIAL WASHINGS;  Surgeon: Brand Males, MD;  Location: [facility] ENDOSCOPY;  Service: Endoscopy;;  ? BUNIONECTOMY Left 2003  ? hammer toe as well  ? BUNIONECTOMY WITH HAMMERTOE RECONSTRUCTION Left 2013  ? repeat  ? CERVICAL DISC SURGERY    ? COLONOSCOPY WITH PROPOFOL N/A 04/30/2021  ? Procedure: COLONOSCOPY WITH PROPOFOL;  Surgeon: Jonathon Bellows, MD;  Location: [facility] ENDOSCOPY;  Service: Gastroenterology;  Laterality: N/A;  ? GREEN LIGHT LASER TURP (TRANSURETHRAL  RESECTION OF PROSTATE  2010  ? laser, shrink prostate  ? KNEE ARTHROSCOPY Right 1991  ? LEG SURGERY Left   ? BENIGN BONE TUMOR  ? [place] SURGERY  2010  ? discectomy  ? [place] SURGERY  2011  ? PROSTATE SURGERY  2002  ? shrink prostate  ? ROTATOR CUFF REPAIR Left 1999  ? debride, remove bonespur  ? ROTATOR CUFF REPAIR Right 1996  ? VIDEO BRONCHOSCOPY N/A 11/26/2021  ? Procedure: VIDEO BRONCHOSCOPY WITHOUT FLUORO;  Surgeon: Brand Males, MD;  Location: [facility] ENDOSCOPY;  Service: Endoscopy;  Laterality: N/A;  for chroni ccough BAL  ? ?Patient Active Problem List  ? Diagnosis Date Noted  ? 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 Chronic radicular lumbar pain 10/27/2021  ? 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 History of lumbar surgery 10/27/2021  ? Chronic pain syndrome 10/27/2021  ? Derangement of right SI joint 09/28/2021  ? Spondylosis of lumbosacral region without myelopathy or radiculopathy 09/28/2021  ? Biceps tendinitis, right 08/26/2021  ? 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 Deviated septum 07/28/2021  ? History of chronic sinusitis 07/28/2021  ? Chronic pansinusitis 05/21/2021  ? Rhinitis 02/04/2021  ? Abnormal findings on diagnostic imaging of lung  07/31/2020  ? Chronic cough 07/31/2020  ? Centrilobular emphysema ([place]) 07/31/2020  ? Healthcare maintenance 07/31/2020  ? Type 2 diabetes mellitus with other specified complication ([place]) 22/97/9892  ? Kidney cysts 01/11/2020  ? Pancreatic cyst 01/11/2020  ? Iron deficiency anemia 01/11/2020  ? Hyperlipidemia associated with type 2 diabetes mellitus ([place]) 01/11/2020  ? Primary osteoarthritis involving multiple joints 01/11/2020  ? BPH associated with nocturia 01/11/2020  ? Cough variant asthma 08/17/2019  ? Essential hypertension   ? ? ?PCP: Olin Hauser, DO ? ?REFERRING PROVIDER: [name], MD ? ?REFERRING DIAG: Diagnosis M24.9  (ICD-10-CM) - Derangement of right SI joint M47.27 (ICD-10-CM) - Osteoarthritis of spine with radiculopathy, lumbosacral region  ? ?THERAPY DIAG:  ?Derangement of right SI joint ? ?Spondylosis of lumbosacral region without myelopathy or radiculopathy ? ?Muscle weakness (generalized) ? ?ONSET DATE: 09/09/21 ? ?SUBJECTIVE:                                                                                                                                                                                          ? ?SUBJECTIVE STATEMENT: ?Pt. Received injection to low back with initial improvement noted.  Pt. States he is feeling slight discomfort in low back today.  Pt. Reports he is able to sleep on either side since injection.  Pt. Has continued to complete HEP on a consistent basis.  Pt. Is still not walking on grassy/ hilly terrain or driving at this time.   ? ? ?PERTINENT HISTORY:  ?Derangement of right SI joint ?Mr. Pankow presents for follow-up to both lateral right hip pain, ongoing for a few weeks, onset after a 1+ mile hike.  He does have intermittent right lower extremity paresthesias.  At his last visit on 09/28/2021 he was advised scheduled meloxicam, escalation to prednisone x5 days, and close follow-up.  Pt. Reports 0-2/10 low back discomfort.   ?  ?He states that while he noted mild improvement with meloxicam, he did initiate the prednisone which did provide further symptom control though he has noted mild recurrence since transitioning back to meloxicam. ?  ?Examination today does reveal both positive seated straight leg raise on the right, benign left, and focal tenderness at the right SI joint.  I did discuss the interplay of the sacroiliac joint and lumbosacral spine, especially given his extensive spine history inclusive of surgery and his radicular features noted today on the right.  Given treatments to date, additional strategies reviewed and he did elect to proceed with ultrasound-guided right SI joint  injection, post care reviewed, and timeline for response relayed.  He will transition to as needed meloxicam from a medication  management standpoint.  Lastly, I have placed a referral to pain and spine for any recalcitrant symptomatology.  If tolerable from a symptom standpoint, he can start home exercises and a gentle and graded manner, these were provided to him today. ? ?PAIN:  ?Are you having pain? 0-2/10 R low back.   ? ?PRECAUTIONS: None ? ?WEIGHT BEARING RESTRICTIONS No ? ?FALLS:  ?Has patient fallen in last 6 months? Yes. Number of falls 1 ? ?LIVING ENVIRONMENT: ?Lives with: lives with their family and lives with their spouse ?Lives in: House/apartment ? ?OCCUPATION: retired ? ?PLOF: Independent ? ?PATIENT GOALS Decrease pain/ return to exercise program and golf weekly.   ? ? ?OBJECTIVE:   Evaluation date: 10/28/21 ? ?DIAGNOSTIC FINDINGS:  ?4/13: ? ?IMPRESSION: ?1. Grade 1 anterolisthesis of L4 on L5 with associated advanced ?facet arthropathy resulting in severe spinal canal stenosis with ?impingement of the cauda equina nerve roots and moderate to severe ?left and severe right neural foraminal stenosis, progressed since ?2010. ?2. Severe left and moderate right neural foraminal stenosis at ?L2-L3, progressed since 2010. ?3. Disc bulge at L5-S1 with possible impingement of the traversing ?right S1 nerve root. There is also narrowing of the right ?subarticular zone at L1-L2 with potential irritation of the ?traversing L2 nerve root. ?4. Grade 1 retrolisthesis of L1 on L2, L2 on L3, and L5 on S1, ?similar to 2010. There is mild levocurvature centered at L2. ?  ?  ?Electronically Signed ?  By: Valetta Mole M.D. ? ? ?IMPRESSION:  ? ?4/5:  ?1. Extensive multilevel lumbar spondylosis and facet hypertrophy, ?greatest at L4-5 and L5-S1. ?2. Retrolisthesis of L1 on L2 and L2 on L3, with grade 1 ?anterolisthesis of L4 on L5. No pars defects. ?3. No instability with flexion or extension. ? ? ?3/2:  ?R hip X-ray ?Mild  bilateral femoroacetabular and sacroiliac osteoarthritis. ? ?PATIENT SURVEYS:  ?FOTO initial 39/ goal 41 ? ?SCREENING FOR RED FLAGS: ?Bowel or bladder incontinence: No ?Spinal tumors: No ?Cauda equina syndrom

## 2021-12-02 ENCOUNTER — Encounter: Payer: Self-pay | Admitting: Primary Care

## 2021-12-02 ENCOUNTER — Ambulatory Visit: Payer: PPO | Admitting: Primary Care

## 2021-12-02 VITALS — BP 132/72 | HR 101 | Temp 97.7°F | Ht 69.0 in | Wt 167.4 lb

## 2021-12-02 DIAGNOSIS — J432 Centrilobular emphysema: Secondary | ICD-10-CM | POA: Diagnosis not present

## 2021-12-02 DIAGNOSIS — A498 Other bacterial infections of unspecified site: Secondary | ICD-10-CM | POA: Diagnosis not present

## 2021-12-02 DIAGNOSIS — J45991 Cough variant asthma: Secondary | ICD-10-CM | POA: Diagnosis not present

## 2021-12-02 DIAGNOSIS — R053 Chronic cough: Secondary | ICD-10-CM

## 2021-12-02 DIAGNOSIS — A491 Streptococcal infection, unspecified site: Secondary | ICD-10-CM

## 2021-12-02 MED ORDER — SPIRIVA RESPIMAT 2.5 MCG/ACT IN AERS: 2.0000 | INHALATION_SPRAY | Freq: Every day | RESPIRATORY_TRACT | 0 refills | Status: DC

## 2021-12-02 NOTE — Assessment & Plan Note (Addendum)
-   Not convinced he has true asthma. Patient had pulmonary function testing in July 2022 that showed no obstructive lung disease or BD response. FENO was borderline at 36 in April 2023. Recommend trial off ICS. Bronchial lavage grew out several different organisms. If cough or resp symptoms worsen off Asmanex advised patient to notify office and we would resume different ICS that is formulary on his insurance.  ?

## 2021-12-02 NOTE — Assessment & Plan Note (Addendum)
-   Patient has bronchial wall thickening and mild centrilobular and paraseptal emphysema on imaging ?- He was previously on Trelegy in the past. Currently not on LAMA.  ?- Adding Spiriva respimat 2.41mg two puffs once daily  ?

## 2021-12-02 NOTE — Progress Notes (Signed)
? ?_0  ID: Isaac Weber, male    DOB: Aug 23, 1945, 76 y.o.   MRN: [id number] ? ?Chief Complaint  ?Patient presents with  ? Follow-up  ?  Pt had recent bronch performed and is here today to have results discussed. Pt states he is not coughing up any phlegm but states he is still coughing a lot and will even wake up in the middle of the night coughing.   ? ? ?Referring provider: ?Nobie Putnam * ? ?HPI: ?76 year old male, former smoker quit in February 1973. PMH significant for chronic cough,  ? ? ? ?Previous [facility] pulmonary encounter:  ?11/20/2021 -  Dr. Chase Caller  ?Chief Complaint  ?Patient presents with  ? Follow-up  ?  Pt states he has been coughing up a lot of phlegm recently that is yellow in color that is thick in consistency and also has some SOB when he has coughing fits as well as has had problems with wheezing.  ? ?Chronic refractory cough ? ?HPI ?[name] 76 y.o. -returns for follow-up.  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  He says the clindamycin that he took with ENT has not helped.  1 month ago ENT put him on gabapentin this was Dr. Omelia Blackwater no.  Has been taking gabapentin for a month but this is also not helped.  He is frustrated by it.  He is not clear as to what exactly makes his cough worse or better.  He does say that when he lies down the cough does get precipitated.  In the middle of the night it does not wake him up but when he wakes up he will have a cough.  He does have some thick yellow stringy sputum.  Small in amount.  RSI cough score is unchanged 22.  Now for the last 1 year or so his cough has been significantly worse than baseline.  It is affecting his quality of life.  He also has back pain.  He believes COVID-vaccine has made his cough worse.  We did a nitric oxide test and is borderline elevated at 36. ? ?We discussed options.  He is agreed for bronchoscopy with lavage. Risks of pneumothorax, hemothorax, sedation/anesthesia complications such as cardiac or respiratory  arrest or hypotension, stroke and bleeding all explained. Benefits of diagnosis but limitations of non-diagnosis also explained. Patient verbalized understanding and wished to proceed.  ? ?We also discussed about empiric biologic treatment for asthma depending on the bronc results and he is agreeable for this.   ? ?He is at his wits end ? ? ?12/02/2021- INTERIM  ?Patient has chronic cough. HRCT in August 2022 showed no definitive ILD. Subtle changes at lung bases felt to be non specific but could suggest mild recurrent aspiration. Multiple tiny 2-54m pulmonary nodules which were felt to reflect mucoid impaction. Mild diffuse bronchial wall thickening with mild centrilobular and paraseptal emphysema. He underwent bronchoscopy with Dr. RChase Calleron 11/26/21. BAL grew out Pseudomonas, streptococcus pneumonia and serratia. He recently had prolonged abx use with ENT, likely need IV antibiotics such as cefepime x 12 days and one more for strep coverage. He previously was noted to have a drug allergy to ciprofloxacin. He tells me he had previously been on cipro and morphine after a shoulder surgery in 1990s and lost signifcaint amount of weight. Both medications were listed as an allergy. He has since been on cipro without issues, allergy has been removed from his list.  ? ?He reports no improvement in cough with Neurontin.  Vocal cord examination by ENT in October 2022 was normal. No significant breathing issues. He denies shortness of breath, chest tightness or wheezing. He is fairly inactive d/t back pain. He would like to change inhalers as Asmanex is tier 3 drug and not formulary on this plan. FENO was 36 in April.  ? ?Allergies  ?Allergen Reactions  ? Morphine And Related Diarrhea and Nausea And Vomiting  ? ? ?Immunization History  ?Administered Date(s) Administered  ? Fluad Quad(high Dose 65+) 05/13/2019, 06/09/2020, 04/21/2021  ? Influenza, High Dose Seasonal PF 07/15/2017, 04/12/2018  ? PFIZER(Purple Top)SARS-COV-2  Vaccination 02/13/2020, 03/05/2020  ? Pneumococcal Conjugate-13 01/28/2014  ? Pneumococcal Polysaccharide-23 03/04/2008, 08/26/2020  ? Tdap 07/12/2011  ? Zoster Recombinat (Shingrix) 04/12/2017, 08/17/2017  ? ? ?Past Medical History:  ?Diagnosis Date  ? Abnormal chest CT   ? Allergic rhinitis, unspecified   ? Anemia   ? Aortic atherosclerosis ([place])   ? BPH (benign prostatic hyperplasia)   ? COPD (chronic obstructive pulmonary disease) ([place])   ? Coronary artery disease   ? Cough variant asthma   ? Glaucoma   ? Hypertension   ? Other chronic pain   ? Pancreatic lesion   ? Pure hypercholesterolemia, unspecified   ? 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 R upper forearm, [facility]  ? 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 R medial lower pretibial, [facility]  ? 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 left medial lower leg above medial ankle; SCC/KA type. Tx: [facility]  ? 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 Right neck proximal mandible. WD SCC, ulcerated. [facility] 04/29/2020  ? ? ?Tobacco History: ?Social History  ? ?Tobacco Use  ?Smoking Status Former  ? Packs/day: 3.00  ? Years: 26.00  ? Pack years: 78.00  ? Types: Cigarettes  ? Quit date: 09/17/1971  ? Years since quitting: 50.2  ?Smokeless Tobacco Never  ? ?Counseling given: Not Answered ? ? ?Outpatient Medications Prior to Visit  ?Medication Sig Dispense Refill  ? albuterol (VENTOLIN HFA) 108 (90 Base) MCG/ACT inhaler Inhale 1-2 puffs into the lungs every 4 (four) hours as needed for wheezing or shortness of breath (cough). 1 each 2  ? amLODipine (NORVASC) 5 MG tablet TAKE ONE TABLET BY MOUTH ONCE DAILY 90 tablet 0  ? baclofen (LIORESAL) 10 MG tablet Take 0.5-1 tablets (5-10 mg total) by mouth 2 (two) times daily as needed for muscle spasms. 60 each 2  ? Blood Glucose Monitoring Suppl (ONE TOUCH ULTRA 2) w/Device KIT Use to check blood sugar up to 2 times daily 1 kit 0  ? ferrous sulfate 325 (65 FE) MG tablet Take 325 mg by mouth daily with breakfast.    ?  fluticasone (FLONASE) 50 MCG/ACT nasal spray USE ONE SPRAY IN EACH NOSTRIL DAILY 48 g 3  ? gabapentin (NEURONTIN) 100 MG capsule Take 100 mg by mouth 3 (three) times daily.    ? Glucosamine-Chondroitin (GLUCOSAMINE CHONDR COMPLEX PO) Take 1 capsule by mouth 2 (two) times daily.    ? hydrochlorothiazide (HYDRODIURIL) 12.5 MG tablet TAKE ONE TABLET BY MOUTH ONCE DAILY 90 tablet 0  ? ibuprofen (ADVIL) 800 MG tablet Take 1 tablet (800 mg total) by mouth 3 (three) times daily. 90 tablet 0  ? ketoconazole (NIZORAL) 2 % shampoo SHAMPOO INTO SCALP, LET SIT FOR 10 MINUTES THEN WASH OFF. USE 3 TIMES PER WEEK (Patient taking differently: Apply 1 application. topically daily.) 120 mL 3  ? Lancets (ONETOUCH ULTRASOFT) lancets Use to check blood  sugar up to twice per day 200 each 5  ? loratadine (CLARITIN) 10 MG tablet Take 10 mg by mouth in the morning.    ? losartan (COZAAR) 50 MG tablet TAKE ONE TABLET BY MOUTH ONCE DAILY 90 tablet 0  ? metFORMIN (GLUCOPHAGE-XR) 500 MG 24 hr tablet TAKE ONE TABLET BY MOUTH EVERYDAY AT BEDTIME 90 tablet 0  ? Mometasone Furoate (ASMANEX HFA) 200 MCG/ACT AERO Inhale 2 puffs into the lungs in the morning and at bedtime. 13 g 11  ? Multiple Minerals-Vitamins (CAL MAG ZINC +D3 PO) Take 1 tablet by mouth every evening.    ? Multiple Vitamin (MULTIVITAMIN WITH MINERALS) TABS tablet Take 1 tablet by mouth daily.    ? ONETOUCH ULTRA test strip Use to check blood sugar up to twice per day 200 each 5  ? rosuvastatin (CRESTOR) 5 MG tablet TAKE ONE TABLET BY MOUTH ONCE DAILY (Patient taking differently: Take 5 mg by mouth every evening.) 90 tablet 1  ? tamsulosin (FLOMAX) 0.4 MG CAPS capsule Take 1 capsule (0.4 mg total) by mouth daily. (Patient taking differently: Take 0.4 mg by mouth every evening.) 90 capsule 3  ? ?No facility-administered medications prior to visit.  ? ?Review of Systems ? ?Review of Systems  ?Constitutional: Negative.   ?HENT: Negative.    ?Respiratory:  Positive for cough. Negative  for chest tightness, shortness of breath and wheezing.   ? ? ?Physical Exam ? ?BP 132/72 (BP Location: Right Arm, Patient Position: Sitting, Cuff Size: Normal)   Pulse (!) 101   Temp 97.7 ?F (36.5 ?C) (Oral)   Ht _0

## 2021-12-02 NOTE — Assessment & Plan Note (Addendum)
-   Patient has chronic cough. BAL grew out Pseudomonas, streptococcus pneumonia and serratia. He recently had prolonged abx use with ENT. Referring to ID for IV abx need/management, patient has able to get an appointment with them tomorrow. Several labs still pending (including QUANT GOLD, ANCA and CCP) ?

## 2021-12-02 NOTE — Patient Instructions (Addendum)
BAL grew out three separate microorganisms (Pseudomonas, streptococcus pneumonia and serratia) ? ?Placing referral to infectious disease, you will likely need IV antibiotic treatment. They should be able to get you in with them fairly quickly and can order treatment as indicated  ? ?Referral: ?ID re: chronic cough d/t multiple organisms (urgent priority)  ? ?Recommendations: ?Stop Asmanex (inhaled steroid) ?Start Spiriva respimat 2.89mg two puff once daily (2 week sample given and RX sent) ?Decreased Neurontin to once daily at bedtime x 1 week; then stop ?Continue over the counter cough medicine  ?If cough or resp symptoms worsen off Asmanex please let uKoreaknow  ? ?Follow-up: ?6-8 weeks with Dr. RChase Calleror BEustaquio MaizeNP  ?

## 2021-12-03 ENCOUNTER — Encounter: Payer: Self-pay | Admitting: Infectious Diseases

## 2021-12-03 ENCOUNTER — Ambulatory Visit: Payer: PPO | Attending: Infectious Diseases | Admitting: Infectious Diseases

## 2021-12-03 ENCOUNTER — Ambulatory Visit: Payer: PPO | Admitting: Infectious Disease

## 2021-12-03 ENCOUNTER — Other Ambulatory Visit
Admission: RE | Admit: 2021-12-03 | Discharge: 2021-12-03 | Disposition: A | Discharge status: Home / Self Care | Payer: PPO | Source: Ambulatory Visit | Attending: Infectious Diseases | Admitting: Infectious Diseases

## 2021-12-03 VITALS — BP 148/77 | HR 86 | Temp 97.2°F | Wt 166.0 lb

## 2021-12-03 DIAGNOSIS — J189 Pneumonia, unspecified organism: Secondary | ICD-10-CM

## 2021-12-03 DIAGNOSIS — J180 Bronchopneumonia, unspecified organism: Secondary | ICD-10-CM | POA: Insufficient documentation

## 2021-12-03 DIAGNOSIS — K219 Gastro-esophageal reflux disease without esophagitis: Secondary | ICD-10-CM | POA: Diagnosis not present

## 2021-12-03 DIAGNOSIS — J45991 Cough variant asthma: Secondary | ICD-10-CM | POA: Diagnosis not present

## 2021-12-03 DIAGNOSIS — M549 Dorsalgia, unspecified: Secondary | ICD-10-CM | POA: Insufficient documentation

## 2021-12-03 DIAGNOSIS — J432 Centrilobular emphysema: Secondary | ICD-10-CM | POA: Insufficient documentation

## 2021-12-03 DIAGNOSIS — J4531 Mild persistent asthma with (acute) exacerbation: Secondary | ICD-10-CM

## 2021-12-03 LAB — COMPREHENSIVE METABOLIC PANEL
ALT: 22 U/L (ref 0–44)
AST: 17 U/L (ref 15–41)
Albumin: 3.7 g/dL (ref 3.5–5.0)
Alkaline Phosphatase: 71 U/L (ref 38–126)
Anion gap: 8 (ref 5–15)
BUN: 14 mg/dL (ref 8–23)
CO2: 26 mmol/L (ref 22–32)
Calcium: 9.4 mg/dL (ref 8.9–10.3)
Chloride: 98 mmol/L (ref 98–111)
Creatinine, Ser: 0.64 mg/dL (ref 0.61–1.24)
GFR, Estimated: >60 mL/min (ref >60)
Glucose, Bld: 152 mg/dL — ABNORMAL HIGH (ref 70–99)
Potassium: 3.4 mmol/L — ABNORMAL LOW (ref 3.5–5.1)
Sodium: 132 mmol/L — ABNORMAL LOW (ref 135–145)
Total Bilirubin: 1.1 mg/dL (ref 0.3–1.2)
Total Protein: 7 g/dL (ref 6.5–8.1)

## 2021-12-03 LAB — CBC WITH DIFFERENTIAL/PLATELET
Abs Immature Granulocytes: 0.23 10*3/uL — ABNORMAL HIGH (ref 0.00–0.07)
Basophils Absolute: 0.1 10*3/uL (ref 0.0–0.1)
Basophils Relative: 1 %
Eosinophils Absolute: 0.2 10*3/uL (ref 0.0–0.5)
Eosinophils Relative: 2 %
HCT: 36.5 % — ABNORMAL LOW (ref 39.0–52.0)
Hemoglobin: 13.2 g/dL (ref 13.0–17.0)
Immature Granulocytes: 2 %
Lymphocytes Relative: 14 %
Lymphs Abs: 1.5 10*3/uL (ref 0.7–4.0)
MCH: 32.2 pg (ref 26.0–34.0)
MCHC: 36.2 g/dL — ABNORMAL HIGH (ref 30.0–36.0)
MCV: 89 fL (ref 80.0–100.0)
Monocytes Absolute: 1.2 10*3/uL — ABNORMAL HIGH (ref 0.1–1.0)
Monocytes Relative: 11 %
Neutro Abs: 7.6 10*3/uL (ref 1.7–7.7)
Neutrophils Relative %: 70 %
Platelets: 222 10*3/uL (ref 150–400)
RBC: 4.1 MIL/uL — ABNORMAL LOW (ref 4.22–5.81)
RDW: 13.2 % (ref 11.5–15.5)
WBC: 10.8 10*3/uL — ABNORMAL HIGH (ref 4.0–10.5)
nRBC: 0 % (ref 0.0–0.2)

## 2021-12-03 LAB — C-REACTIVE PROTEIN: CRP: 0.7 mg/dL (ref <1.0)

## 2021-12-03 NOTE — Progress Notes (Addendum)
NAME: Isaac Weber  ?DOB: [birth year]  ?MRN: [id number]  ?Date/Time: 12/03/2021 10:49 AM ? ?REQUESTING PROVIDER Dr.Ramaswamy ?Subjective:  ?REASON FOR CONSULT: Polymicrobial ?Lower respiratory infection. ?Isaac Weber is a 75 y.o. male is referred to me for persistent cough with purulent sputum for which he underwent bronchoscopy and bronchoalveolar lavage cultures were sent on 11/26/2021.  The culture is positive for Pseudomonas, Serratia and strep pneumo.  He is referred to me for antibiotic management. ?On reviewing his medical records completely it is noted that patient has had cough since 2018 and initially was followed by his PCP.  He was treated for acid reflux with Nexium and as it did not make any difference he was referred to [facility] pulmonology December 2018.  He saw Dr. Chase Caller on 07/08/2017.  He was given a short course of prednisone.  He was also put on bronchodilator.  He was diagnosed as having cough variant asthma.  He was seeing him every 6 months.  It looks like the cough was improving and worsening intermittently. ?High-resolution CT scan was done Aug 2020 and that showed mild paraseptal emphysema, mild diffuse bronchial wall thickening and mild saber-sheath trachea configuration suggesting COPD. ?Patient took COVID-vaccine on 02/13/2020 and 03/05/2020 and from October 2021 for cough became worse with increasing purulent sputum.  His PCP gave him on course of prednisone and azithromycin May 19, 2020.  I started him on albuterol as needed and asked him to continue Asmanex inhaler.  He had another high-resolution CT of the chest on 07/24/2020 and it showed mild centrilobular and paraseptal emphysema with diffuse bronchial wall thickening and saber-sheath trachea compatible with COPD.  Stable 4 mm left upper lobe solid pulmonary nodule consistent with benign. ?He went to [place] in March 2022. ?As the cough was persisting and kept him up at night and he had postnasal drip he was referred to  ENT.  He has seen GI as well but no endoscopy was done but he was given a trial of PPI. ?He had CT sinus March 17, 2021 and that showed mucosal thickening in the right frontal sinus, ethmoid air cells and inferior maxillary sinus.  The right frontoethmoidal recess and left ostiomeatal units were occluded. He also had a high-resolution CT chest done on 03/16/2021 and 2 to 3 mm pulmonary nodules scattered throughout the periphery of the lungs bilaterally nonspecific but status tickly likely to represent benign areas of mucoid impaction within the terminal bronchioles.  Along with previously seen mild central lobar and paraseptal emphysema. ? ? He saw ENT PA Sallee Provencal at [facility] on 05/21/2021.  Underwent flexible laryngoscopy that showed patent anterior nasal cavity, nasopharynx with yellow mucopurulence and normal base of tongue and supraglottis.  Normal vocal cord mobility without vocal cord nodule mass polyp or tumor.  He was prescribed a 14-day course of clindamycin 300 mg TID. ? ?He had repeat CT sinus on 10/05/21 and it showed Resolution of the sinus inflammatory changes. ?He saw Dr. Lind Guest on 10/23/2021 and she prescribed gabapentin and suggested bronchoscopy. ?HE underwent bronch on 5/4/2 and bronc showed RMB and the PI and RLL full of green mucus that was thick and difficult to suction.  The procedure was terminated because of excessive cough.  So the right lung was only visualized.  BAL culture showed serratia, pseudomonas and Strep pneumo and he is referred to me ? ?Patient is here with his wife ?He does not have any fever ?But he has incessant cough especially worse at  nighttime.  It is causing him to have back pain. ?He has recently received on 11/25/2021 steroid injections right L4 and L5 epidural steroid injection by Dr. Holley Raring ?His last antibiotic was clindamycin.  Not sure when he took it but likely in March ?He has not taken ciprofloxacin in many months now ?The  Pseudomonas and Serratia are sensitive to ciprofloxacin. ?Past Medical History:  ?Diagnosis Date  ? Abnormal chest CT   ? Allergic rhinitis, unspecified   ? Anemia   ? Aortic atherosclerosis ([place])   ? BPH (benign prostatic hyperplasia)   ? COPD (chronic obstructive pulmonary disease) ([place])   ? Coronary artery disease   ? Cough variant asthma   ? Glaucoma   ? Hypertension   ? Other chronic pain   ? Pancreatic lesion   ? Pure hypercholesterolemia, unspecified   ? 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 R upper forearm, [facility]  ? 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 R medial lower pretibial, [facility]  ? 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 left medial lower leg above medial ankle; SCC/KA type. Tx: [facility]  ? 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 Right neck proximal mandible. WD SCC, ulcerated. [facility] 04/29/2020  ?  ?Past Surgical History:  ?Procedure Laterality Date  ? BACK SURGERY    ? BRONCHIAL WASHINGS  11/26/2021  ? Procedure: BRONCHIAL WASHINGS;  Surgeon: Brand Males, MD;  Location: [facility] ENDOSCOPY;  Service: Endoscopy;;  ? BUNIONECTOMY Left 2003  ? hammer toe as well  ? BUNIONECTOMY WITH HAMMERTOE RECONSTRUCTION Left 2013  ? repeat  ? CERVICAL DISC SURGERY    ? COLONOSCOPY WITH PROPOFOL N/A 04/30/2021  ? Procedure: COLONOSCOPY WITH PROPOFOL;  Surgeon: Jonathon Bellows, MD;  Location: [facility] ENDOSCOPY;  Service: Gastroenterology;  Laterality: N/A;  ? GREEN LIGHT LASER TURP (TRANSURETHRAL RESECTION OF PROSTATE  2010  ? laser, shrink prostate  ? KNEE ARTHROSCOPY Right 1991  ? LEG SURGERY Left   ? BENIGN BONE TUMOR  ? [place] SURGERY  2010  ? discectomy  ? [place] SURGERY  2011  ? PROSTATE SURGERY  2002  ? shrink prostate  ? ROTATOR CUFF REPAIR Left 1999  ? debride, remove bonespur  ? ROTATOR CUFF REPAIR Right 1996  ? VIDEO BRONCHOSCOPY N/A 11/26/2021  ? Procedure: VIDEO BRONCHOSCOPY WITHOUT FLUORO;  Surgeon: Brand Males, MD;  Location: [facility] ENDOSCOPY;  Service: Endoscopy;  Laterality: N/A;  for chroni  ccough BAL  ?  ?Social History  ? ?Socioeconomic History  ? Marital status: Married  ?  Spouse name: Elmor Kost  ? Number of children: Not on file  ? Years of education: 69  ? Highest education level: High school graduate  ?Occupational History  ? Occupation: retired  ?Tobacco Use  ? Smoking status: Former  ?  Packs/day: 3.00  ?  Years: 26.00  ?  Pack years: 78.00  ?  Types: Cigarettes  ?  Quit date: 09/17/1971  ?  Years since quitting: 50.2  ? Smokeless tobacco: Never  ?Vaping Use  ? Vaping Use: Never used  ?Substance and Sexual Activity  ? Alcohol use: Yes  ?  Alcohol/week: 2.0 standard drinks  ?  Types: 2 Standard drinks or equivalent per week  ? Drug use: Never  ? Sexual activity: Yes  ?  Partners: Female  ?Other Topics Concern  ? Not on file  ?Social History Narrative  ? Not on file  ? ?Social Determinants of Health  ? ?Financial Resource Strain: Low Risk   ?  Difficulty of Paying Living Expenses: Not hard at all  ?Food Insecurity: No Food Insecurity  ? Worried About Charity fundraiser in the Last Year: Never true  ? Ran Out of Food in the Last Year: Never true  ?Transportation Needs: No Transportation Needs  ? Lack of Transportation (Medical): No  ? Lack of Transportation (Non-Medical): No  ?Physical Activity: Insufficiently Active  ? Days of Exercise per Week: 7 days  ? Minutes of Exercise per Session: 20 min  ?Stress: No Stress Concern Present  ? Feeling of Stress : Not at all  ?Social Connections: Not on file  ?Intimate Partner Violence: Not on file  ?  ?Family History  ?Problem Relation Age of Onset  ? Heart disease Mother 48  ? Emphysema Father 60  ? Heart disease Brother 43  ? ?Allergies  ?Allergen Reactions  ? Morphine And Related Diarrhea and Nausea And Vomiting  ? ?I? ?Current Outpatient Medications  ?Medication Sig Dispense Refill  ? albuterol (VENTOLIN HFA) 108 (90 Base) MCG/ACT inhaler Inhale 1-2 puffs into the lungs every 4 (four) hours as needed for wheezing or shortness of breath (cough). 1  each 2  ? amLODipine (NORVASC) 5 MG tablet TAKE ONE TABLET BY MOUTH ONCE DAILY 90 tablet 0  ? baclofen (LIORESAL) 10 MG tablet Take 0.5-1 tablets (5-10 mg total) by mouth 2 (two) times daily as needed for mu

## 2021-12-03 NOTE — Patient Instructions (Signed)
You are here for chronic cough/emphysema and purulent sputum . The culture from BAL had pseudomonas, serratia and strep- will do labs today and will give ciprofloxacin- the dose of cipro will depend on the kidney test- while on cipro space the dairy products and iron MVT 3 hrs before or 3 hrs after ?

## 2021-12-04 ENCOUNTER — Encounter: Payer: Self-pay | Admitting: Infectious Diseases

## 2021-12-04 ENCOUNTER — Other Ambulatory Visit: Payer: Self-pay | Admitting: Infectious Diseases

## 2021-12-04 LAB — QUANTIFERON-TB GOLD PLUS
Mitogen-NIL: >10 IU/mL
NIL: 0.04 IU/mL
QuantiFERON-TB Gold Plus: NEGATIVE
TB1-NIL: 0.01 IU/mL
TB2-NIL: 0 IU/mL

## 2021-12-04 MED ORDER — CIPROFLOXACIN HCL 750 MG PO TABS: 750.0000 mg | ORAL_TABLET | Freq: Two times a day (BID) | ORAL | 0 refills | Status: DC

## 2021-12-07 ENCOUNTER — Other Ambulatory Visit: Payer: PPO

## 2021-12-07 ENCOUNTER — Encounter: Payer: Self-pay | Admitting: Internal Medicine

## 2021-12-07 ENCOUNTER — Encounter: Payer: Self-pay | Admitting: Infectious Diseases

## 2021-12-07 DIAGNOSIS — E1169 Type 2 diabetes mellitus with other specified complication: Secondary | ICD-10-CM

## 2021-12-07 LAB — ANA: Anti Nuclear Antibody (ANA): POSITIVE — AB

## 2021-12-07 LAB — HIV ANTIBODY (ROUTINE TESTING W REFLEX): HIV 1&2 Ab, 4th Generation: NONREACTIVE

## 2021-12-07 LAB — CYCLIC CITRUL PEPTIDE ANTIBODY, IGG: Cyclic Citrullin Peptide Ab: <16 UNITS

## 2021-12-07 LAB — ANTI-NUCLEAR AB-TITER (ANA TITER)
ANA TITER: 1:40 {titer} — ABNORMAL HIGH
ANA Titer 1: 1:40 {titer} — ABNORMAL HIGH

## 2021-12-07 LAB — ANTI-DNA ANTIBODY, DOUBLE-STRANDED: ds DNA Ab: <1 IU/mL

## 2021-12-07 LAB — IGG, IGA, IGM
IgG (Immunoglobin G), Serum: 865 mg/dL (ref 600–1540)
IgM, Serum: 245 mg/dL (ref 50–300)
Immunoglobulin A: 142 mg/dL (ref 70–320)

## 2021-12-07 LAB — ANCA SCREEN W REFLEX TITER: ANCA SCREEN: NEGATIVE

## 2021-12-07 LAB — RHEUMATOID FACTOR: Rheumatoid fact SerPl-aCnc: <14 IU/mL (ref <14)

## 2021-12-07 MED ORDER — SODIUM CHLORIDE 3 % IN NEBU: INHALATION_SOLUTION | RESPIRATORY_TRACT | 12 refills | Status: DC | PRN

## 2021-12-07 NOTE — Telephone Encounter (Signed)
Spoke with pt who states he started Cipro on Friday and his productive cough/ wheezing have not gotten any better. Pt is have a nurse visit tomorrow with his ID doctor to be swabbed and labs to be drawn. Pt states taking all medications as directed. Pt states cough is causing so much pain he is hurting in both back and knees. Dr. Chase Caller please advise.   ?

## 2021-12-07 NOTE — Telephone Encounter (Signed)
I d/w Dr Delaine Lame today ? ?Plan ? - 39m bid of 3% hypertonic saline nebulizer ? - if this and cipro does not work, I will have to figure out with her next step ?- go to ER if worse ?

## 2021-12-07 NOTE — Telephone Encounter (Signed)
I called and spoke with the pt and notified of response per Dr Chase Caller. He verbalized understanding. Rx for neb sent to pharm.  ?

## 2021-12-08 ENCOUNTER — Ambulatory Visit: Payer: PPO

## 2021-12-08 ENCOUNTER — Encounter: Payer: Self-pay | Admitting: Internal Medicine

## 2021-12-08 ENCOUNTER — Ambulatory Visit: Payer: PPO | Admitting: Physical Therapy

## 2021-12-08 ENCOUNTER — Telehealth: Payer: Self-pay | Admitting: Internal Medicine

## 2021-12-08 ENCOUNTER — Encounter: Payer: Self-pay | Admitting: Student in an Organized Health Care Education/Training Program

## 2021-12-08 ENCOUNTER — Encounter: Payer: Self-pay | Admitting: Physical Therapy

## 2021-12-08 ENCOUNTER — Other Ambulatory Visit: Payer: Self-pay

## 2021-12-08 ENCOUNTER — Other Ambulatory Visit
Admission: RE | Admit: 2021-12-08 | Discharge: 2021-12-08 | Disposition: A | Discharge status: Home / Self Care | Payer: PPO | Source: Ambulatory Visit | Attending: Infectious Diseases | Admitting: Infectious Diseases

## 2021-12-08 DIAGNOSIS — R053 Chronic cough: Secondary | ICD-10-CM | POA: Insufficient documentation

## 2021-12-08 DIAGNOSIS — M249 Joint derangement, unspecified: Secondary | ICD-10-CM | POA: Diagnosis not present

## 2021-12-08 DIAGNOSIS — M6281 Muscle weakness (generalized): Secondary | ICD-10-CM

## 2021-12-08 DIAGNOSIS — J189 Pneumonia, unspecified organism: Secondary | ICD-10-CM | POA: Insufficient documentation

## 2021-12-08 DIAGNOSIS — J180 Bronchopneumonia, unspecified organism: Secondary | ICD-10-CM | POA: Diagnosis not present

## 2021-12-08 DIAGNOSIS — M47817 Spondylosis without myelopathy or radiculopathy, lumbosacral region: Secondary | ICD-10-CM

## 2021-12-08 LAB — RESPIRATORY PANEL BY PCR

## 2021-12-08 LAB — HEMOGLOBIN A1C
Hgb A1c MFr Bld: 7.1 % of total Hgb — ABNORMAL HIGH (ref <5.7)
Mean Plasma Glucose: 157 mg/dL
eAG (mmol/L): 8.7 mmol/L

## 2021-12-08 NOTE — Therapy (Signed)
?OUTPATIENT PHYSICAL THERAPY THORACOLUMBAR TREATMENT ? ? ?Patient Name: Isaac Weber ?MRN: [id number] ?DOB:03-Mar-1946, 76 y.o., male ?Today's Date: 12/08/2021 ? ? PT End of Session - 12/08/21 1328   ? ? Visit Number 11   ? Number of Visits 16   ? Date for PT Re-Evaluation 12/16/21   ? PT Start Time 1328   ? PT Stop Time 0076   ? PT Time Calculation (min) 47 min   ? Activity Tolerance Patient tolerated treatment well   ? Behavior During Therapy [facility] for tasks assessed/performed   ? ?  ?  ? ?  ? ? ? ?Past Medical History:  ?Diagnosis Date  ? Abnormal chest CT   ? Allergic rhinitis, unspecified   ? Anemia   ? Aortic atherosclerosis ([place])   ? BPH (benign prostatic hyperplasia)   ? COPD (chronic obstructive pulmonary disease) ([place])   ? Coronary artery disease   ? Cough variant asthma   ? Glaucoma   ? Hypertension   ? Other chronic pain   ? Pancreatic lesion   ? Pure hypercholesterolemia, unspecified   ? 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 R upper forearm, [facility]  ? 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 R medial lower pretibial, [facility]  ? 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 left medial lower leg above medial ankle; SCC/KA type. Tx: [facility]  ? 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 Right neck proximal mandible. WD SCC, ulcerated. [facility] 04/29/2020  ? ?Past Surgical History:  ?Procedure Laterality Date  ? BACK SURGERY    ? BRONCHIAL WASHINGS  11/26/2021  ? Procedure: BRONCHIAL WASHINGS;  Surgeon: Brand Males, MD;  Location: [facility] ENDOSCOPY;  Service: Endoscopy;;  ? BUNIONECTOMY Left 2003  ? hammer toe as well  ? BUNIONECTOMY WITH HAMMERTOE RECONSTRUCTION Left 2013  ? repeat  ? CERVICAL DISC SURGERY    ? COLONOSCOPY WITH PROPOFOL N/A 04/30/2021  ? Procedure: COLONOSCOPY WITH PROPOFOL;  Surgeon: Jonathon Bellows, MD;  Location: [facility] ENDOSCOPY;  Service: Gastroenterology;  Laterality: N/A;  ? GREEN LIGHT LASER TURP (TRANSURETHRAL RESECTION OF PROSTATE  2010  ? laser, shrink prostate  ? KNEE ARTHROSCOPY Right 1991   ? LEG SURGERY Left   ? BENIGN BONE TUMOR  ? [place] SURGERY  2010  ? discectomy  ? [place] SURGERY  2011  ? PROSTATE SURGERY  2002  ? shrink prostate  ? ROTATOR CUFF REPAIR Left 1999  ? debride, remove bonespur  ? ROTATOR CUFF REPAIR Right 1996  ? VIDEO BRONCHOSCOPY N/A 11/26/2021  ? Procedure: VIDEO BRONCHOSCOPY WITHOUT FLUORO;  Surgeon: Brand Males, MD;  Location: [facility] END[facility];  Service: Endoscopy;  Laterality: N/A;  for chroni ccough BAL  ? ?Patient Active Problem List  ? Diagnosis Date Noted  ? 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 Chronic radicular lumbar pain 10/27/2021  ? 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 History of lumbar surgery 10/27/2021  ? Chronic pain syndrome 10/27/2021  ? Derangement of right SI joint 09/28/2021  ? Spondylosis of lumbosacral region without myelopathy or radiculopathy 09/28/2021  ? Biceps tendinitis, right 08/26/2021  ? 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 Deviated septum 07/28/2021  ? History of chronic sinusitis 07/28/2021  ? Chronic pansinusitis 05/21/2021  ? Rhinitis 02/04/2021  ? Abnormal findings on diagnostic imaging of lung 07/31/2020  ? Chronic cough 07/31/2020  ? Centrilobular emphysema ([place]) 07/31/2020  ? Healthcare maintenance  07/31/2020  ? Type 2 diabetes mellitus with other specified complication ([place]) 85/46/2703  ? Kidney cysts 01/11/2020  ? Pancreatic cyst 01/11/2020  ? Iron deficiency anemia 01/11/2020  ? Hyperlipidemia associated with type 2 diabetes mellitus ([place]) 01/11/2020  ? Primary osteoarthritis involving multiple joints 01/11/2020  ? BPH associated with nocturia 01/11/2020  ? Cough variant asthma 08/17/2019  ? Essential hypertension   ? ? ?PCP: Olin Hauser, DO ? ?REFERRING PROVIDER: [name], MD ? ?REFERRING DIAG: Diagnosis M24.9 (ICD-10-CM) - Derangement of right SI joint M47.27 (ICD-10-CM) - Osteoarthritis of  spine with radiculopathy, lumbosacral region  ? ?THERAPY DIAG:  ?Derangement of right SI joint ? ?Spondylosis of lumbosacral region without myelopathy or radiculopathy ? ?Muscle weakness (generalized) ? ?ONSET DATE: 09/09/21 ? ?SUBJECTIVE:                                                                                                                                                                                          ? ?SUBJECTIVE STATEMENT: ?Pt. Reports he has been coughing a lot due to infection in lungs.  Pt. Put on a 60 Isaac Weber antibiotic treatment.  Pt. States when he starts coughing a lot he has increase discomfort in back.  PT discussed use of back brace during coughing episodes.      ? ? ?PERTINENT HISTORY:  ?Derangement of right SI joint ?[name] presents for follow-up to both lateral right hip pain, ongoing for a few weeks, onset after a 1+ mile hike.  He does have intermittent right lower extremity paresthesias.  At his last visit on 09/28/2021 he was advised scheduled meloxicam, escalation to prednisone x5 days, and close follow-up.  Pt. Reports 0-2/10 low back discomfort.   ?  ?He states that while he noted mild improvement with meloxicam, he did initiate the prednisone which did provide further symptom control though he has noted mild recurrence since transitioning back to meloxicam. ?  ?Examination today does reveal both positive seated straight leg raise on the right, benign left, and focal tenderness at the right SI joint.  I did discuss the interplay of the sacroiliac joint and lumbosacral spine, especially given his extensive spine history inclusive of surgery and his radicular features noted today on the right.  Given treatments to date, additional strategies reviewed and he did elect to proceed with ultrasound-guided right SI joint injection, post care reviewed, and timeline for response relayed.  He will transition to as needed meloxicam from a medication management standpoint.  Lastly, I have  placed a referral to pain and spine for any recalcitrant symptomatology.  If tolerable from a symptom standpoint, he can start  home exercises and a gentle and graded manner, these were provided to him today. ? ?PAIN:  ?Are you having pain? 0-2/10 R low back.   ? ?PRECAUTIONS: None ? ?WEIGHT BEARING RESTRICTIONS No ? ?FALLS:  ?Has patient fallen in last 6 months? Yes. Number of falls 1 ? ?LIVING ENVIRONMENT: ?Lives with: lives with their family and lives with their spouse ?Lives in: House/apartment ? ?OCCUPATION: retired ? ?PLOF: Independent ? ?PATIENT GOALS Decrease pain/ return to exercise program and golf weekly.   ? ? ?OBJECTIVE:   Evaluation date: 10/28/21 ? ?DIAGNOSTIC FINDINGS:  ?4/13: ? ?IMPRESSION: ?1. Grade 1 anterolisthesis of L4 on L5 with associated advanced ?facet arthropathy resulting in severe spinal canal stenosis with ?impingement of the cauda equina nerve roots and moderate to severe ?left and severe right neural foraminal stenosis, progressed since ?2010. ?2. Severe left and moderate right neural foraminal stenosis at ?L2-L3, progressed since 2010. ?3. Disc bulge at L5-S1 with possible impingement of the traversing ?right S1 nerve root. There is also narrowing of the right ?subarticular zone at L1-L2 with potential irritation of the ?traversing L2 nerve root. ?4. Grade 1 retrolisthesis of L1 on L2, L2 on L3, and L5 on S1, ?similar to 2010. There is mild levocurvature centered at L2. ?  ?  ?Electronically Signed ?  By: Valetta Mole M.D. ? ? ?IMPRESSION:  ? ?4/5:  ?1. Extensive multilevel lumbar spondylosis and facet hypertrophy, ?greatest at L4-5 and L5-S1. ?2. Retrolisthesis of L1 on L2 and L2 on L3, with grade 1 ?anterolisthesis of L4 on L5. No pars defects. ?3. No instability with flexion or extension. ? ? ?3/2:  ?R hip X-ray ?Mild bilateral femoroacetabular and sacroiliac osteoarthritis. ? ?PATIENT SURVEYS:  ?FOTO initial 39/ goal 56 ? ?SCREENING FOR RED FLAGS: ?Bowel or bladder incontinence:  No ?Spinal tumors: No ?Cauda equina syndrome: No ?Compression fracture: No ?Abdominal aneurysm: No ? ?COGNITION: ? Overall cognitive status: Within functional limits for tasks assessed   ?  ?SENSATION: ?[facility]

## 2021-12-08 NOTE — Progress Notes (Signed)
error 

## 2021-12-08 NOTE — Addendum Note (Signed)
Addended by: Tsosie Billing on: 12/08/2021 08:42 AM ? ? Modules accepted: Orders ? ?

## 2021-12-09 MED ORDER — SODIUM CHLORIDE 3 % IN NEBU: INHALATION_SOLUTION | RESPIRATORY_TRACT | 12 refills | Status: DC | PRN

## 2021-12-09 NOTE — Telephone Encounter (Signed)
Dr. Erin Fulling we place this nebulizer order under you so it can be signed as Dr. Chase Caller is not available  ?

## 2021-12-09 NOTE — Telephone Encounter (Signed)
Spoke with pt and notified him that orders for neb machine and solution will be sent to [facility] stated understanding. Nothing further needed at this time.  ?

## 2021-12-09 NOTE — Addendum Note (Signed)
Addended by: Gavin Potters R on: 12/09/2021 04:28 PM ? ? Modules accepted: Orders ? ?

## 2021-12-09 NOTE — Telephone Encounter (Signed)
See telephone encounter from 12/09/21 ?

## 2021-12-10 NOTE — Telephone Encounter (Signed)
Nebulizer machine order placed and staff already spoke to patient on phone yesterday! Nothing further needed

## 2021-12-11 ENCOUNTER — Telehealth: Payer: Self-pay | Admitting: Internal Medicine

## 2021-12-11 DIAGNOSIS — J4520 Mild intermittent asthma, uncomplicated: Secondary | ICD-10-CM | POA: Diagnosis not present

## 2021-12-11 DIAGNOSIS — R053 Chronic cough: Secondary | ICD-10-CM | POA: Diagnosis not present

## 2021-12-11 MED ORDER — BUDESONIDE 0.5 MG/2ML IN SUSP
0.5000 mg | Freq: Every day | RESPIRATORY_TRACT | 0 refills | Status: DC
Start: 2021-12-11 — End: 2022-01-28

## 2021-12-11 MED ORDER — SODIUM CHLORIDE 3 % IN NEBU: INHALATION_SOLUTION | RESPIRATORY_TRACT | 12 refills | Status: DC | PRN

## 2021-12-11 NOTE — Telephone Encounter (Signed)
This patient seen Dr. Chase Caller on 11/20/21 and he was prescribed 3% hypertonic saline solution and the pharmacy is having a delay and he wants to know if something else like albuterol nebulizer could be sent to local pharmacy to cover him until next week. Please advise.

## 2021-12-11 NOTE — Telephone Encounter (Signed)
I called the patient and let him know that per Derl Barrow she would send in Pulmicort and he was appreciative of the call back and all the hard work. Nothing further needed.

## 2021-12-11 NOTE — Telephone Encounter (Signed)
Please advise if I can send in another nebulizer solution to the local pharmacy until he has his hypertonic saline solution.

## 2021-12-11 NOTE — Telephone Encounter (Signed)
Please send in budesonide 0.5% twice a day and albuterol q 4-6 hours for shortness of breath

## 2021-12-11 NOTE — Telephone Encounter (Signed)
   [name] called coughing bad and gasping for air during the call. Pt needs medication for his nebulizer Pt states that he is afraid that he will not be able to catch his breath over the week and he is stating he need to speak with someone a good call back number ends in [phone]

## 2021-12-14 ENCOUNTER — Ambulatory Visit (INDEPENDENT_AMBULATORY_CARE_PROVIDER_SITE_OTHER): Payer: PPO | Admitting: Family Medicine

## 2021-12-14 ENCOUNTER — Encounter: Payer: Self-pay | Admitting: Family Medicine

## 2021-12-14 VITALS — BP 124/60 | HR 101 | Ht 69.0 in | Wt 164.0 lb

## 2021-12-14 DIAGNOSIS — E1169 Type 2 diabetes mellitus with other specified complication: Secondary | ICD-10-CM | POA: Diagnosis not present

## 2021-12-14 DIAGNOSIS — J432 Centrilobular emphysema: Secondary | ICD-10-CM

## 2021-12-14 DIAGNOSIS — R053 Chronic cough: Secondary | ICD-10-CM | POA: Diagnosis not present

## 2021-12-14 NOTE — Progress Notes (Unsigned)
Subjective:    Patient ID: Isaac Weber, male    DOB: January 04, 1946, 76 y.o.   MRN: [id number]  Isaac Weber is a 76 y.o. male presenting on 12/14/2021 for Diabetes and Back Pain   HPI  CHRONIC DM, Type 2 Hyperlipidemia ASCVD Risk  Updates, significant changes with cortisone injection, physical strain stress on body raising his sugar.  A1c recently 12/07/21 A1c 7.1  He remains on Rosuvastatin '5mg'$  daily for cardiovascular prevention. Home CBG readings avg 150+ highest at 300 Meds: Metformin XR '500mg'$  daily at bedtime Reports good compliance. Tolerating well w/o side-effects Currently on ARB, Statin Denies hypoglycemia, polyuria, visual changes, numbness or tingling.     CHRONIC HTN: BP is controlled currently. Current Meds - Losartan-HCTZ 50-12.'5mg'$  daily, Amlodipine '5mg'$  daily Reports good compliance, took meds today. Tolerating well, w/o complaints. Denies CP, dyspnea, HA, edema, dizziness / lightheadedness   History of Iron Deficiency Donates blood every 8 weeks. He takes iron supplement.   Osteoarthritis, bilateral knees, lumbar spine Chronic problem, multiple joints, episodic pain and flares He has has seen chiropractor as well. He said he tried Relief Factor, for 7 weeks limited effect. - Uses Voltaren topical PRN with good results - Not effective Tylenol Taking Motrin '200mg'$  x 4 = '800mg'$  PRN only on day of golf. 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 They were going to do PT session for muscle strengthening.  Cortisone injections previuosly   BPH LUTS  Chronic Cough Pneumonia due multiple bacteria Emphysema vs Asthma Had prior bronchoscope Has had PFTs Followed by ID and Pulmonology Continues on Cipro antibiotic based on respiratory panel/culture On Albuterol and Budesonide, Nebulizers On Probiotic Reduced Phlegm  improved.      12/14/2021   10:56 AM 12/03/2021   10:16 AM 11/25/2021   12:50 PM  Depression screen PHQ 2/9  Decreased Interest 0 0 0  Down, Depressed, Hopeless 0 0 0  PHQ - 2 Score 0 0 0  Altered sleeping 0    Tired, decreased energy 1    Change in appetite 2    Feeling bad or failure about yourself  0    Trouble concentrating 0    Moving slowly or fidgety/restless 2    Suicidal thoughts 0    PHQ-9 Score 5    Difficult doing work/chores Extremely dIfficult      Social History   Tobacco Use   Smoking status: Former    Packs/day: 3.00    Years: 26.00    Pack years: 78.00    Types: Cigarettes    Quit date: 09/17/1971    Years since quitting: 50.2   Smokeless tobacco: Never  Vaping Use   Vaping Use: Never used  Substance Use Topics   Alcohol use: Yes    Alcohol/week: 2.0 standard drinks    Types: 2 Standard drinks or equivalent per week   Drug use: Never    Review of Systems Per HPI unless specifically indicated above     Objective:    BP 124/60   Pulse (!) 101   Ht '5\' 9"'$  (1.753 m)   Wt 164 lb (74.4 kg)   SpO2 99%   BMI 24.22 kg/m   Wt Readings from Last 3 Encounters:  12/14/21 164 lb (74.4 kg)  12/03/21 166 lb (75.3 kg)  12/02/21 167 lb 6.4 oz (75.9 kg)    Physical  Exam Vitals and nursing note reviewed.  Constitutional:      General: He is not in acute distress.    Appearance: He is well-developed. He is not diaphoretic.     Comments: Well-appearing, comfortable, cooperative  HENT:     Head: Normocephalic and atraumatic.  Eyes:     General:        Right eye: No discharge.        Left eye: No discharge.     Conjunctiva/sclera: Conjunctivae normal.  Neck:     Thyroid: No thyromegaly.  Cardiovascular:     Rate and Rhythm: Normal rate and regular rhythm.     Pulses: Normal pulses.     Heart sounds: Normal heart sounds. No murmur heard. Pulmonary:     Effort: Pulmonary effort is normal. No respiratory distress.     Breath sounds: Wheezing present.  No rales.     Comments: Coughing spells, bronchospasm Musculoskeletal:        General: Normal range of motion.     Cervical back: Normal range of motion and neck supple.  Lymphadenopathy:     Cervical: No cervical adenopathy.  Skin:    General: Skin is warm and dry.     Findings: No erythema or rash.  Neurological:     Mental Status: He is alert and oriented to person, place, and time. Mental status is at baseline.  Psychiatric:        Behavior: Behavior normal.     Comments: Well groomed, good eye contact, normal speech and thoughts   Results for orders placed or performed during the hospital encounter of 12/08/21  Respiratory (~20 pathogens) panel by PCR   Specimen: Nasopharyngeal Swab; Respiratory  Result Value Ref Range   Adenovirus NOT DETECTED NOT DETECTED   Coronavirus 229E NOT DETECTED NOT DETECTED   Coronavirus HKU1 NOT DETECTED NOT DETECTED   Coronavirus NL63 NOT DETECTED NOT DETECTED   Coronavirus OC43 NOT DETECTED NOT DETECTED   Metapneumovirus NOT DETECTED NOT DETECTED   Rhinovirus / Enterovirus NOT DETECTED NOT DETECTED   Influenza A NOT DETECTED NOT DETECTED   Influenza B NOT DETECTED NOT DETECTED   Parainfluenza Virus 1 NOT DETECTED NOT DETECTED   Parainfluenza Virus 2 NOT DETECTED NOT DETECTED   Parainfluenza Virus 3 NOT DETECTED NOT DETECTED   Parainfluenza Virus 4 NOT DETECTED NOT DETECTED   Respiratory Syncytial Virus NOT DETECTED NOT DETECTED   Bordetella pertussis NOT DETECTED NOT DETECTED   Bordetella Parapertussis NOT DETECTED NOT DETECTED   Chlamydophila pneumoniae NOT DETECTED NOT DETECTED   Mycoplasma pneumoniae NOT DETECTED NOT DETECTED      Assessment & Plan:   Problem List Items Addressed This Visit     Type 2 diabetes mellitus with other specified complication ([place]) - Primary   Chronic cough   Centrilobular emphysema ([place])    Keep close watch on sugars, goal A1c < 7-8 range. At this time, moving fairly slow, therefore we can keep on  current course, would expect A1c to still drift upward based on this trend, but hopefully when the other factors are improved it will go back to correct range.  We can consider new medications Ozempic, Trulicity, Mounjaro all injectable, Rybelsus (oral)  May return sooner within 3 months if still issue, but seems to be fairly steady.  Regarding chronic cough / emphysema Complex problem with advanced work up [employer] and diagnostics w/ infectious disease and pulmonology  Keep w/ specialists on breathing treatments   No orders of the defined  types were placed in this encounter.     Follow up plan: Return in about 6 months (around 06/16/2022) for 6 month fasting lab only then 1 week later Annual Physical.  Future labs ordered for 05/2022 including PSA   Nobie Putnam, [facility] 12/14/2021, 11:02 AM

## 2021-12-14 NOTE — Patient Instructions (Addendum)
Thank you for coming to the office today.  Keep close watch on sugars, goal A1c < 7-8 range. At this time, moving fairly slow, therefore we can keep on current course, would expect A1c to still drift upward based on this trend, but hopefully when the other factors are improved it will go back to correct range.  We can consider new medications Ozempic, Trulicity, Mounjaro all injectable, Rybelsus (oral)  Keep up the good work! Working with infectious disease / pulmonology  If doing well on sugar, we can see you in 6 months.  DUE for FASTING BLOOD WORK (no food or drink after midnight before the lab appointment, only water or coffee without cream/sugar on the morning of)  SCHEDULE "Lab Only" visit in the morning at the clinic for lab draw in 6 MONTHS   - Make sure Lab Only appointment is at about 1 week before your next appointment, so that results will be available  For Lab Results, once available within 2-3 days of blood draw, you can can log in to MyChart online to view your results and a brief explanation. Also, we can discuss results at next follow-up visit.    Please schedule a Follow-up Appointment to: Return in about 6 months (around 06/16/2022) for 6 month fasting lab only then 1 week later Annual Physical.  If you have any other questions or concerns, please feel free to call the office or send a message through [place]. You may also schedule an earlier appointment if necessary.  Additionally, you may be receiving a survey about your experience at our office within a few days to 1 week by e-mail or mail. We value your feedback.  Nobie Putnam, DO [place]

## 2021-12-15 ENCOUNTER — Encounter: Payer: Self-pay | Admitting: Physical Therapy

## 2021-12-15 ENCOUNTER — Ambulatory Visit: Payer: PPO | Admitting: Physical Therapy

## 2021-12-15 ENCOUNTER — Other Ambulatory Visit: Payer: Self-pay | Admitting: Family Medicine

## 2021-12-15 DIAGNOSIS — N401 Enlarged prostate with lower urinary tract symptoms: Secondary | ICD-10-CM

## 2021-12-15 DIAGNOSIS — I10 Essential (primary) hypertension: Secondary | ICD-10-CM

## 2021-12-15 DIAGNOSIS — M249 Joint derangement, unspecified: Secondary | ICD-10-CM | POA: Diagnosis not present

## 2021-12-15 DIAGNOSIS — M6281 Muscle weakness (generalized): Secondary | ICD-10-CM

## 2021-12-15 DIAGNOSIS — M47817 Spondylosis without myelopathy or radiculopathy, lumbosacral region: Secondary | ICD-10-CM

## 2021-12-15 DIAGNOSIS — Z0000 Encounter for general adult medical examination without abnormal findings: Secondary | ICD-10-CM

## 2021-12-15 DIAGNOSIS — E1169 Type 2 diabetes mellitus with other specified complication: Secondary | ICD-10-CM

## 2021-12-15 NOTE — Therapy (Signed)
OUTPATIENT PHYSICAL THERAPY THORACOLUMBAR TREATMENT   Patient Name: Isaac Weber MRN: [id number] DOB:28-Jan-1946, 76 y.o., male Today's Date: 12/15/21   PT End of Session - 12/15/21 1037     Visit Number 12    Number of Visits 16    Date for PT Re-Evaluation 12/16/21    PT Start Time 1031 to 72 (32 minutes)   Activity Tolerance Patient tolerated treatment well    Behavior During Therapy [facility] for tasks assessed/performed          Past Medical History:  Diagnosis Date   Abnormal chest CT    Allergic rhinitis, unspecified    Anemia    Aortic atherosclerosis (HCC)    BPH (benign prostatic hyperplasia)    COPD (chronic obstructive pulmonary disease) ([place])    Coronary artery disease    Cough variant asthma    Glaucoma    Hypertension    Other chronic pain    Pancreatic lesion    Pure hypercholesterolemia, unspecified    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 WD SCC, ulcerated. [facility] 04/29/2020   Past Surgical History:  Procedure Laterality Date   BACK SURGERY     BRONCHIAL WASHINGS  11/26/2021   Procedure: BRONCHIAL WASHINGS;  Surgeon: Brand Males, MD;  Location: [facility] ENDOSCOPY;  Service: Endoscopy;;   BUNIONECTOMY Left 2003   hammer toe as well   BUNIONECTOMY WITH HAMMERTOE RECONSTRUCTION Left 2013   repeat   CERVICAL DISC SURGERY     COLONOSCOPY WITH PROPOFOL N/A 04/30/2021   Procedure: COLONOSCOPY WITH PROPOFOL;  Surgeon: Jonathon Bellows, MD;  Location: [facility] ENDOSCOPY;  Service: Gastroenterology;  Laterality: N/A;   GREEN LIGHT LASER TURP (TRANSURETHRAL RESECTION OF PROSTATE  2010   laser, shrink prostate   KNEE ARTHROSCOPY Right 1991   LEG SURGERY Left    BENIGN BONE TUMOR    LUMBAR [place] SURGERY  2010   discectomy   LUMBAR DISC SURGERY  2011   PROSTATE SURGERY  2002   shrink prostate   ROTATOR CUFF REPAIR Left 1999   debride, remove bonespur   ROTATOR CUFF REPAIR Right 1996   VIDEO BRONCHOSCOPY N/A 11/26/2021   Procedure: VIDEO BRONCHOSCOPY WITHOUT FLUORO;  Surgeon: Brand Males, MD;  Location: [facility] ENDOSCOPY;  Service: Endoscopy;  Laterality: N/A;  for chroni ccough BAL   Patient Active Problem List   Diagnosis Date Noted   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 11/17/2021   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) 11/17/2021   Chronic radicular lumbar pain 10/27/2021   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   History of lumbar surgery 10/27/2021   Chronic pain syndrome 10/27/2021   Derangement of right SI joint 09/28/2021   Spondylosis of lumbosacral region without myelopathy or radiculopathy 09/28/2021   Biceps tendinitis, right 08/26/2021   Supraspinatus syndrome, right 08/26/2021   Deviated septum 07/28/2021   History of chronic sinusitis 07/28/2021   Chronic pansinusitis 05/21/2021   Rhinitis 02/04/2021   Abnormal findings on diagnostic imaging of lung 07/31/2020   Chronic cough 07/31/2020   Centrilobular emphysema ([place]) 07/31/2020   Healthcare maintenance 07/31/2020   Type 2 diabetes mellitus with other specified complication ([place]) 75/17/0017   Kidney cysts  01/11/2020   Pancreatic cyst 01/11/2020   Iron deficiency anemia 01/11/2020   Hyperlipidemia associated with type 2 diabetes mellitus ([place]) 01/11/2020   Primary osteoarthritis involving multiple joints 01/11/2020   BPH associated with nocturia 01/11/2020   Cough variant asthma 08/17/2019   Essential hypertension     PCP: Olin Hauser, DO  REFERRING PROVIDER:  Olin Hauser, DO  REFERRING DIAG: Diagnosis M24.9 (ICD-10-CM) - Derangement of right SI joint M47.27 (ICD-10-CM) - Osteoarthritis of spine with radiculopathy, lumbosacral  region   THERAPY DIAG:  Derangement of right SI joint  Spondylosis of lumbosacral region without myelopathy or radiculopathy  Muscle weakness (generalized)  ONSET DATE: 09/09/21  SUBJECTIVE:                                                                                                                                                                                           SUBJECTIVE STATEMENT: Pt. Had f/u with PCP yesterday.  Pt. Reports an elevation in A1C but no change to medications at this time.  Pt. Reports slight improvement in coughing over past couple days and sleeping better.     PERTINENT HISTORY:  Derangement of right SI joint Mr. Carmicheal presents for follow-up to both lateral right hip pain, ongoing for a few weeks, onset after a 1+ mile hike.  He does have intermittent right lower extremity paresthesias.  At his last visit on 09/28/2021 he was advised scheduled meloxicam, escalation to prednisone x5 days, and close follow-up.  Pt. Reports 0-2/10 low back discomfort.     He states that while he noted mild improvement with meloxicam, he did initiate the prednisone which did provide further symptom control though he has noted mild recurrence since transitioning back to meloxicam.   Examination today does reveal both positive seated straight leg raise on the right, benign left, and focal tenderness at the right SI joint.  I did discuss the interplay of the sacroiliac joint and lumbosacral spine, especially given his extensive spine history inclusive of surgery and his radicular features noted today on the right.  Given treatments to date, additional strategies reviewed and he did elect to proceed with ultrasound-guided right SI joint injection, post care reviewed, and timeline for response relayed.  He will transition to as needed meloxicam from a medication management standpoint.  Lastly, I have placed a referral to pain and spine for any recalcitrant symptomatology.  If tolerable from  a symptom standpoint, he can start home exercises and a gentle and graded manner, these were provided to him today.  PAIN:  Are you having pain? 1/10 R low back.    PRECAUTIONS: None  WEIGHT BEARING RESTRICTIONS No  FALLS:  Has patient fallen in last 6 months? Yes. Number of falls 1  LIVING ENVIRONMENT: Lives with: lives with their family and lives with their spouse Lives in: House/apartment  OCCUPATION: retired  PLOF: Independent  PATIENT GOALS Decrease pain/ return to exercise program and golf weekly.     OBJECTIVE:   Evaluation date: 10/28/21  DIAGNOSTIC FINDINGS:  4/13:  IMPRESSION: 1. Grade 1 anterolisthesis of L4 on L5 with associated advanced facet arthropathy resulting in severe spinal canal stenosis with impingement of the cauda equina nerve roots and moderate to severe left and severe right neural foraminal stenosis, progressed since 2010. 2. Severe left and moderate right neural foraminal stenosis at L2-L3, progressed since 2010. 3. Disc bulge at L5-S1 with possible impingement of the traversing right S1 nerve root. There is also narrowing of the right subarticular zone at L1-L2 with potential irritation of the traversing L2 nerve root. 4. Grade 1 retrolisthesis of L1 on L2, L2 on L3, and L5 on S1, similar to 2010. There is mild levocurvature centered at L2.     Electronically Signed   By: Valetta Mole M.D.   IMPRESSION:   4/5:  1. Extensive multilevel lumbar spondylosis and facet hypertrophy, greatest at L4-5 and L5-S1. 2. Retrolisthesis of L1 on L2 and L2 on L3, with grade 1 anterolisthesis of L4 on L5. No pars defects. 3. No instability with flexion or extension.   3/2:  R hip X-ray Mild bilateral femoroacetabular and sacroiliac osteoarthritis.  PATIENT SURVEYS:  FOTO initial 39/ goal 35  SCREENING FOR RED FLAGS: Bowel or bladder incontinence: No Spinal tumors: No Cauda equina syndrome: No Compression fracture: No Abdominal aneurysm:  No  COGNITION:  Overall cognitive status: Within functional limits for tasks assessed     SENSATION: WFL  POSTURE:  Slight forward head/ rounded shoulder posture.   PALPATION: Low thoracic/ lumbar paraspinal muscle tightness.   4/26: Assessment of LE strength:   5xSTS: 20.29 sec. Age matched norms for age group is 12.6 seconds.  TODAY'S TREATMENT   12/15/21:  There.ex.:  Nustep L4 for 10 min. B UE/LE.  Discussed weekend activities.      TG knee flexion/ heel raises 10x2.    12" step touches at stairs (10x with heel and toe touches)  5# seated/standing hip ex.:  LAQ/ marching/ heel and toe raises/ walking in //-bars forward and backwards/ lateral walking with no UE assist 5 laps each.  Standing hamstring curls 20x each (cuing to correct upright posture).    Walking in clinic/ outside on varying terrain (grass/ sidewalks/ ramps) with no issues of LOB noted.  Pt. Reports wife is concerned with pt. Walking in neighborhood with hills/ uneven terrain.    Supine LE/lumbar generalized stretches (all planes)- as tolerated.  Supine trunk rotn. With light overpressure/ static holds 3x each.  Seated thoracic/lumbar rotn. 2x each.  No increase c/o pain.     5xSTS: 11.06 seconds.      12/08/21:  There.ex.:  Nustep L5 for 10 min. B UE/LE.  Discussed recent MD visit for lung infection.    Supine TrA ex.: hip adduction with ball/ SAQ with ball control/ bridging with ball 20x.    Discussed HEP  Manual tx.:  Supine LE/lumbar generalized stretches (all planes)- as tolerated.  Supine trunk rotn. With light overpressure/ static holds 3x each. No increase c/o pain (21 minutes)  Prone STM to mid-thoracic/lumbar paraspinals.  Prone quad/ hip flexor stretches 3x each with static holds  as tolerated.         PATIENT EDUCATION:  Education details: Access Code: [email or web address] Person educated: Patient and Spouse Education method: Explanation, Demonstration, and Handouts Education  comprehension: verbalized understanding and returned demonstration   HOME EXERCISE PROGRAM: Access Code: [email or web address] URL: [email or web address] Date: 10/22/2021 Prepared by: Dorcas Carrow   Exercises - Supine Bridge  - 1 x daily - 5 x weekly - 2 sets - 10 reps - Hooklying Clamshell with Resistance  - 1 x daily - 5 x weekly - 2 sets - 10 reps - Supine March with Resistance Band  - 1 x daily - 5 x weekly - 2 sets - 10 reps - Supine Straight Leg Raises  - 1 x daily - 5 x weekly - 2 sets - 10 reps  Added scap. Retraction BTB/ supine trunk rotn./ partial squats  ASSESSMENT:  CLINICAL IMPRESSION: Pt. Has less coughing today as compared to last tx. Session.  PT focus on LE strengthening to improve walking/ balance.   No balance issues with walking in clinic/ outside. Pt. Understands current HEP and instructed to start walking more outside as pain permits. Marked improvement in 5xSTS today during tx. Session.  Pt. Will continue to benefit from skilled PT services to increase strength/ balance.     OBJECTIVE IMPAIRMENTS decreased endurance, decreased mobility, decreased ROM, decreased strength, hypomobility, impaired flexibility, postural dysfunction, and pain.   ACTIVITY LIMITATIONS community activity and yard work.   PERSONAL FACTORS Age, Fitness, Past/current experiences, and 1-2 comorbidities:     REHAB POTENTIAL: Good  CLINICAL DECISION MAKING: Evolving/moderate complexity  EVALUATION COMPLEXITY: Moderate   GOALS: Goals reviewed with patient? Yes  SHORT TERM GOALS: Target date: 11/05/21  Pt. Independent with HEP to increase hip strength 1/2 muscle grade to improve return to activity.   Baseline: See note; Pt performing 5x/week Goal status:  Goal met   LONG TERM GOALS: Target date: 12/16/21  Pt. Will increase FOTO to 57 to improve pain-free mobility.   Baseline: initial FOTO: 39.  4/26: 51 Goal status: Partially Met  2.  Pt. Able to return to exercise program  with no increase c/o low back pain to improve pain-free mobility.   Baseline: pt. Has not exercised for 6 weeks; 11/18/21: Upper body resistance training without back pain Goal status: Partially met.  3.  Pt. Able to golf 18 holes with no increase c/o low back pain.   Baseline: currently not golfing; 11/18/21: Has not attempted Goal status: Ongoing  4. Pt will improve 5xSTS to 12.6 sec or less for age matched norms to display improvement in Le strength for functional mobility tasks.  Baseline: 20.29 sec.  5/23: 11.06 sec.    Goal Status: Goal met    PLAN: PT FREQUENCY: 2x/week  PT DURATION: 4 weeks  PLANNED INTERVENTIONS: Therapeutic exercises, Therapeutic activity, Neuromuscular re-education, Balance training, Gait training, Patient/Family education, Joint mobilization, Dry Needling, Electrical stimulation, and Manual therapy.  PLAN FOR NEXT SESSION: Progress HEP   Pura Spice, PT, DPT # [phone] Physical Therapist- [facility]   12/18/2021, 8:35 AM

## 2021-12-16 NOTE — Telephone Encounter (Signed)
[name] please advise on the following My Chart message:   GIBSON LAD "[name]"  P [facility] Pool (supporting Martyn Ehrich, NP) 12 hours ago (9:15 PM)   Eustaquio Maize, as you know Dr. Chase Caller has ordered Sodium chloride hypertonic 3% Nebulizer solution. And you ordered Budesonide as temporary relief my question is you ordered so much do you think I will be using it after Dr. Chase Caller prescription is done, as it turned out the pharmacy only had 1/2 the prescribed amount I'm kind of Simeon Craft about picking up the other half .  Thank you

## 2021-12-16 NOTE — Telephone Encounter (Signed)
He does not need to pick up the other 1/2 of prescription. I want him to use budesonide twice a day until follow-up. The reason I added this was because we had taken him off a steroid inhaler as a trial and since his breathing worsened off steriod I added it back in neb form

## 2021-12-22 ENCOUNTER — Encounter: Payer: Self-pay | Admitting: Physical Therapy

## 2021-12-22 ENCOUNTER — Ambulatory Visit: Payer: PPO | Admitting: Physical Therapy

## 2021-12-22 DIAGNOSIS — M249 Joint derangement, unspecified: Secondary | ICD-10-CM

## 2021-12-22 DIAGNOSIS — M6281 Muscle weakness (generalized): Secondary | ICD-10-CM

## 2021-12-22 DIAGNOSIS — M47817 Spondylosis without myelopathy or radiculopathy, lumbosacral region: Secondary | ICD-10-CM

## 2021-12-22 NOTE — Therapy (Incomplete)
OUTPATIENT PHYSICAL THERAPY THORACOLUMBAR TREATMENT   Patient Name: Isaac Weber MRN: [id number] DOB:[birth year], 76 y.o., male Today's Date: 12/22/21   PT End of Session - 12/15/21 1037     Visit Number 13    Number of Visits 17    Date for PT Re-Evaluation 01/19/22    PT Start Time 1032 to 1120 (48 minutes)   Activity Tolerance Patient tolerated treatment well    Behavior During Therapy [facility] for tasks assessed/performed          Past Medical History:  Diagnosis Date   Abnormal chest CT    Allergic rhinitis, unspecified    Anemia    Aortic atherosclerosis (HCC)    BPH (benign prostatic hyperplasia)    COPD (chronic obstructive pulmonary disease) ([place])    Coronary artery disease    Cough variant asthma    Glaucoma    Hypertension    Other chronic pain    Pancreatic lesion    Pure hypercholesterolemia, unspecified    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 WD SCC, ulcerated. [facility] 04/29/2020   Past Surgical History:  Procedure Laterality Date   BACK SURGERY     BRONCHIAL WASHINGS  11/26/2021   Procedure: BRONCHIAL WASHINGS;  Surgeon: Brand Males, MD;  Location: [facility] ENDOSCOPY;  Service: Endoscopy;;   BUNIONECTOMY Left 2003   hammer toe as well   BUNIONECTOMY WITH HAMMERTOE RECONSTRUCTION Left 2013   repeat   CERVICAL DISC SURGERY     COLONOSCOPY WITH PROPOFOL N/A 04/30/2021   Procedure: COLONOSCOPY WITH PROPOFOL;  Surgeon: Jonathon Bellows, MD;  Location: [facility] ENDOSCOPY;  Service: Gastroenterology;  Laterality: N/A;   GREEN LIGHT LASER TURP (TRANSURETHRAL RESECTION OF PROSTATE  2010   laser, shrink prostate   KNEE ARTHROSCOPY Right 1991   LEG SURGERY Left    BENIGN BONE TUMOR    LUMBAR [place] SURGERY  2010   discectomy   LUMBAR DISC SURGERY  2011   PROSTATE SURGERY  2002   shrink prostate   ROTATOR CUFF REPAIR Left 1999   debride, remove bonespur   ROTATOR CUFF REPAIR Right 1996   VIDEO BRONCHOSCOPY N/A 11/26/2021   Procedure: VIDEO BRONCHOSCOPY WITHOUT FLUORO;  Surgeon: Brand Males, MD;  Location: [facility] ENDOSCOPY;  Service: Endoscopy;  Laterality: N/A;  for chroni ccough BAL   Patient Active Problem List   Diagnosis Date Noted   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 11/17/2021   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) 11/17/2021   Chronic radicular lumbar pain 10/27/2021   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   History of lumbar surgery 10/27/2021   Chronic pain syndrome 10/27/2021   Derangement of right SI joint 09/28/2021   Spondylosis of lumbosacral region without myelopathy or radiculopathy 09/28/2021   Biceps tendinitis, right 08/26/2021   Supraspinatus syndrome, right 08/26/2021   Deviated septum 07/28/2021   History of chronic sinusitis 07/28/2021   Chronic pansinusitis 05/21/2021   Rhinitis 02/04/2021   Abnormal findings on diagnostic imaging of lung 07/31/2020   Chronic cough 07/31/2020   Centrilobular emphysema ([place]) 07/31/2020   Healthcare maintenance 07/31/2020   Type 2 diabetes mellitus with other specified complication ([place]) 35/46/5681   Kidney cysts  01/11/2020   Pancreatic cyst 01/11/2020   Iron deficiency anemia 01/11/2020   Hyperlipidemia associated with type 2 diabetes mellitus ([place]) 01/11/2020   Primary osteoarthritis involving multiple joints 01/11/2020   BPH associated with nocturia 01/11/2020   Cough variant asthma 08/17/2019   Essential hypertension     PCP: Olin Hauser, DO  REFERRING PROVIDER:  Olin Hauser, DO  REFERRING DIAG: Diagnosis M24.9 (ICD-10-CM) - Derangement of right SI joint M47.27 (ICD-10-CM) - Osteoarthritis of spine with radiculopathy, lumbosacral  region   THERAPY DIAG:  Derangement of right SI joint  Spondylosis of lumbosacral region without myelopathy or radiculopathy  Muscle weakness (generalized)  ONSET DATE: 09/09/21  SUBJECTIVE:                                                                                                                                                                                           SUBJECTIVE STATEMENT: Pt. States he returns to Dr. Holley Raring this week.  Pt. Reports an increase in L shoulder pain while waving to a neighbor and lost his balance but did not fall.  Pt. Is trying to walk on a daily basis with his wife.     PERTINENT HISTORY:  Derangement of right SI joint [name] presents for follow-up to both lateral right hip pain, ongoing for a few weeks, onset after a 1+ mile hike.  He does have intermittent right lower extremity paresthesias.  At his last visit on 09/28/2021 he was advised scheduled meloxicam, escalation to prednisone x5 days, and close follow-up.  Pt. Reports 0-2/10 low back discomfort.     He states that while he noted mild improvement with meloxicam, he did initiate the prednisone which did provide further symptom control though he has noted mild recurrence since transitioning back to meloxicam.   Examination today does reveal both positive seated straight leg raise on the right, benign left, and focal tenderness at the right SI joint.  I did discuss the interplay of the sacroiliac joint and lumbosacral spine, especially given his extensive spine history inclusive of surgery and his radicular features noted today on the right.  Given treatments to date, additional strategies reviewed and he did elect to proceed with ultrasound-guided right SI joint injection, post care reviewed, and timeline for response relayed.  He will transition to as needed meloxicam from a medication management standpoint.  Lastly, I have placed a referral to pain and spine for any recalcitrant symptomatology.   If tolerable from a symptom standpoint, he can start home exercises and a gentle and graded manner, these were provided to him today.  PAIN:  Are you having pain? No  pain reported at time of PT tx.  PRECAUTIONS: None  WEIGHT BEARING RESTRICTIONS No  FALLS:  Has patient fallen in last 6 months? Yes. Number of falls 1  LIVING ENVIRONMENT: Lives with: lives with their family and lives with their spouse Lives in: House/apartment  OCCUPATION: retired  PLOF: Independent  PATIENT GOALS Decrease pain/ return to exercise program and golf weekly.     OBJECTIVE:   Evaluation date: 10/28/21  DIAGNOSTIC FINDINGS:  4/13:  IMPRESSION: 1. Grade 1 anterolisthesis of L4 on L5 with associated advanced facet arthropathy resulting in severe spinal canal stenosis with impingement of the cauda equina nerve roots and moderate to severe left and severe right neural foraminal stenosis, progressed since 2010. 2. Severe left and moderate right neural foraminal stenosis at L2-L3, progressed since 2010. 3. Disc bulge at L5-S1 with possible impingement of the traversing right S1 nerve root. There is also narrowing of the right subarticular zone at L1-L2 with potential irritation of the traversing L2 nerve root. 4. Grade 1 retrolisthesis of L1 on L2, L2 on L3, and L5 on S1, similar to 2010. There is mild levocurvature centered at L2.     Electronically Signed   By: Valetta Mole M.D.   IMPRESSION:   4/5:  1. Extensive multilevel lumbar spondylosis and facet hypertrophy, greatest at L4-5 and L5-S1. 2. Retrolisthesis of L1 on L2 and L2 on L3, with grade 1 anterolisthesis of L4 on L5. No pars defects. 3. No instability with flexion or extension.   3/2:  R hip X-ray Mild bilateral femoroacetabular and sacroiliac osteoarthritis.  PATIENT SURVEYS:  FOTO initial 39/ goal 35  SCREENING FOR RED FLAGS: Bowel or bladder incontinence: No Spinal tumors: No Cauda equina syndrome: No Compression  fracture: No Abdominal aneurysm: No  COGNITION:  Overall cognitive status: Within functional limits for tasks assessed     SENSATION: WFL  POSTURE:  Slight forward head/ rounded shoulder posture.   PALPATION: Low thoracic/ lumbar paraspinal muscle tightness.   4/26: Assessment of LE strength:   5xSTS: 20.29 sec. Age matched norms for age group is 12.6 seconds.  TODAY'S TREATMENT   12/22/21:  There.ex.:  Nustep L5 for 8 min. B LE.  Pt. Reports his respiration has been improving since last PT visit.  Pt. States he continues to have a series of LOB but no falls.    Supine LE/lumbar generalized stretches (all planes)- as tolerated.  B ankles/ hamstring/ hip IR/ER.  No pain reported.      Neuro:    Agility ladder: walking with consistent step pattern/ length 4x.  Lateral walking/ Forward and backward step to ex. 4x at ladder.  Walking cone taps (challenged).  Forwards/backwards with diagonal at agility ladder 3x.      Tandem Airex: forward walking in //-bars 8x.  No UE assist    Airex:  NBOS 30 sec./ SLS (R LE 36 sec./ L LE 30 sec.).  No UE assist but SBA/CGA for safety.  L LE more challenging than R LE on Airex.      Walking over 6"/12" hurdles in //-bars 4 laps.      Standing squats (no UE)- 10x2.  Mirror feedback/ posture correction    Walking outside on varying terrain/ curbs/ around clinic on sidewalk to focus on endurance.      12/15/21:  There.ex.:  Nustep L4 for 10 min. B UE/LE.  Discussed weekend activities.      TG knee flexion/ heel raises 10x2.    12" step touches  at stairs (10x with heel and toe touches)  5# seated/standing hip ex.:  LAQ/ marching/ heel and toe raises/ walking in //-bars forward and backwards/ lateral walking with no UE assist 5 laps each.  Standing hamstring curls 20x each (cuing to correct upright posture).    Walking in clinic/ outside on varying terrain (grass/ sidewalks/ ramps) with no issues of LOB noted.  Pt. Reports wife is  concerned with pt. Walking in neighborhood with hills/ uneven terrain.    Supine LE/lumbar generalized stretches (all planes)- as tolerated.  Supine trunk rotn. With light overpressure/ static holds 3x each.  Seated thoracic/lumbar rotn. 2x each.  No increase c/o pain.     5xSTS: 11.06 seconds.      PATIENT EDUCATION:  Education details: Access Code: [email or web address] Person educated: Patient and Spouse Education method: Explanation, Demonstration, and Handouts Education comprehension: verbalized understanding and returned demonstration   HOME EXERCISE PROGRAM: Access Code: [email or web address] URL: [email or web address] Date: 10/22/2021 Prepared by: Dorcas Carrow   Exercises - Supine Bridge  - 1 x daily - 5 x weekly - 2 sets - 10 reps - Hooklying Clamshell with Resistance  - 1 x daily - 5 x weekly - 2 sets - 10 reps - Supine March with Resistance Band  - 1 x daily - 5 x weekly - 2 sets - 10 reps - Supine Straight Leg Raises  - 1 x daily - 5 x weekly - 2 sets - 10 reps  Added scap. Retraction BTB/ supine trunk rotn./ partial squats  ASSESSMENT:  CLINICAL IMPRESSION: Pt. Able to self-correct with a LOB in //-bars while completing hurdles.  Pt. Requires SBA/CGA with more dynamic tasks, esp. Outside of //-bars. Pt. Understands current HEP and instructed to start walking more outside as endurance/balance allows.   Marked increase in SLS (R>L) and a marked increase in strength on R.  L LE muscle fatigue noted as tx. Progresses.  Pt. Returns to MD this week and will contact PT after f/u to discuss POC.  Pt. Will continue to benefit from skilled PT services to increase strength/ balance.     OBJECTIVE IMPAIRMENTS decreased endurance, decreased mobility, decreased ROM, decreased strength, hypomobility, impaired flexibility, postural dysfunction, and pain.   ACTIVITY LIMITATIONS community activity and yard work.   PERSONAL FACTORS Age, Fitness, Past/current experiences, and 1-2 comorbidities:      REHAB POTENTIAL: Good  CLINICAL DECISION MAKING: Evolving/moderate complexity  EVALUATION COMPLEXITY: Moderate   GOALS: Goals reviewed with patient? Yes  SHORT TERM GOALS: Target date: 11/05/21  Pt. Independent with HEP to increase hip strength 1/2 muscle grade to improve return to activity.   Baseline: See note; Pt performing 5x/week Goal status:  Goal met   LONG TERM GOALS: Target date: 01/19/22  Pt. Will increase FOTO to 57 to improve pain-free mobility.   Baseline: initial FOTO: 39.  4/26: 51.  12/22/21: 67 Goal status: Goal Met  2.  Pt. Able to return to exercise program with no increase c/o low back pain to improve pain-free mobility.   Baseline: pt. Has not exercised for 6 weeks; 11/18/21: Upper body resistance training without back pain Goal status: Partially met.  3.  Pt. Able to golf 18 holes with no increase c/o low back pain.   Baseline: currently not golfing; 11/18/21: Has not attempted Goal status: Ongoing  4. Pt will improve 5xSTS to 12.6 sec or less for age matched norms to display improvement in Le strength for functional mobility tasks.  Baseline: 20.29 sec.  5/23: 11.06  sec.    Goal Status: Goal met    PLAN: PT FREQUENCY: 2x/week  PT DURATION: 4 weeks  PLANNED INTERVENTIONS: Therapeutic exercises, Therapeutic activity, Neuromuscular re-education, Balance training, Gait training, Patient/Family education, Joint mobilization, Dry Needling, Electrical stimulation, and Manual therapy.  PLAN FOR NEXT SESSION: Discuss MD appt.    Pura Spice, PT, DPT # [phone] Physical Therapist- [facility]   12/22/2021, 12:44 PM

## 2021-12-24 ENCOUNTER — Encounter: Payer: Self-pay | Admitting: Student in an Organized Health Care Education/Training Program

## 2021-12-24 ENCOUNTER — Encounter: Payer: Self-pay | Admitting: Dermatology

## 2021-12-24 ENCOUNTER — Ambulatory Visit
Payer: PPO | Attending: Student in an Organized Health Care Education/Training Program | Admitting: Student in an Organized Health Care Education/Training Program

## 2021-12-24 ENCOUNTER — Ambulatory Visit: Payer: PPO | Admitting: Dermatology

## 2021-12-24 DIAGNOSIS — G8929 Other chronic pain: Secondary | ICD-10-CM

## 2021-12-24 DIAGNOSIS — L820 Inflamed seborrheic keratosis: Secondary | ICD-10-CM

## 2021-12-24 DIAGNOSIS — G894 Chronic pain syndrome: Secondary | ICD-10-CM

## 2021-12-24 DIAGNOSIS — M5416 Radiculopathy, lumbar region: Secondary | ICD-10-CM | POA: Diagnosis not present

## 2021-12-24 DIAGNOSIS — M5126 Other intervertebral disc displacement, lumbar region: Secondary | ICD-10-CM | POA: Diagnosis not present

## 2021-12-24 DIAGNOSIS — L219 Seborrheic dermatitis, unspecified: Secondary | ICD-10-CM | POA: Diagnosis not present

## 2021-12-24 DIAGNOSIS — L570 Actinic keratosis: Secondary | ICD-10-CM

## 2021-12-24 DIAGNOSIS — L578 Other skin changes due to chronic exposure to nonionizing radiation: Secondary | ICD-10-CM

## 2021-12-24 NOTE — Progress Notes (Signed)
Follow-Up Visit   Subjective  Isaac Weber is a 76 y.o. male who presents for the following: Actinic Keratosis (Face, ears, bil hands/arms, 2 on L 3rd finger didn't resolve, PDT hands/arms 07/30/2021). The patient has spots, moles and lesions to be evaluated, some may be new or changing and the patient has concerns that these could be cancer.  The following portions of the chart were reviewed this encounter and updated as appropriate:   Tobacco  Allergies  Meds  Problems  Med Hx  Surg Hx  Fam Hx     Review of Systems:  No other skin or systemic complaints except as noted in HPI or Assessment and Plan.  Objective  Well appearing patient in no apparent distress; mood and affect are within normal limits.  A focused examination was performed including face, ears, arms, hands. Relevant physical exam findings are noted in the Assessment and Plan.  face, ears, hands x 25 (25) Pink scaly macules  Scalp Scalp clear today  arms, hands x 5 (5) Stuck on waxy paps with erythema   Assessment & Plan   Actinic Damage - chronic, secondary to cumulative UV radiation exposure/sun exposure over time - diffuse scaly erythematous macules with underlying dyspigmentation - Recommend daily broad spectrum sunscreen SPF 30+ to sun-exposed areas, reapply every 2 hours as needed.  - Recommend staying in the shade or wearing long sleeves, sun glasses (UVA+UVB protection) and wide brim hats (4-inch brim around the entire circumference of the hat). - Call for new or changing lesions.   AK (actinic keratosis) (25) face, ears, hands x 25 If AK on L 3rd finger not resolved in 6 weeks patient instructed to return to clinic.  Destruction of lesion - face, ears, hands x 25 Complexity: simple   Destruction method: cryotherapy   Informed consent: discussed and consent obtained   Timeout:  patient name, date of birth, surgical site, and procedure verified Lesion destroyed using liquid nitrogen: Yes    Region frozen until ice ball extended beyond lesion: Yes   Outcome: patient tolerated procedure well with no complications   Post-procedure details: wound care instructions given    Seborrheic dermatitis Scalp Seborrheic Dermatitis  -  is a chronic persistent rash characterized by pinkness and scaling most commonly of the mid face but also can occur on the scalp (dandruff), ears; mid chest, mid back and groin.  It tends to be exacerbated by stress and cooler weather.  People who have neurologic disease may experience new onset or exacerbation of existing seborrheic dermatitis.  The condition is not curable but treatable and can be controlled.  Cont [place] 2% cr ~3x/wk let sit 5 minutes and rinse out  Inflamed seborrheic keratosis (5) arms, hands x 5 Symptomatic, irritating, patient would like treated.  Destruction of lesion - arms, hands x 5 Complexity: simple   Destruction method: cryotherapy   Informed consent: discussed and consent obtained   Timeout:  patient name, date of birth, surgical site, and procedure verified Lesion destroyed using liquid nitrogen: Yes   Region frozen until ice ball extended beyond lesion: Yes   Outcome: patient tolerated procedure well with no complications   Post-procedure details: wound care instructions given    Return in about 4 months (around 04/25/2022) for TBSE, Hx of SCC, AK f/u.  I, Othelia Pulling, RMA, am acting as scribe for Sarina Ser, MD . Documentation: I have reviewed the above documentation for accuracy and completeness, and I agree with the above.  Sarina Ser,  MD

## 2021-12-24 NOTE — Patient Instructions (Addendum)

## 2021-12-24 NOTE — Progress Notes (Signed)
Patient: Isaac Weber  Service Category: E/M  Provider: Gillis Santa, MD  DOB: [birth year]  DOS: 12/24/2021  Location: Office  MRN: [id number]  Setting: Ambulatory outpatient  Referring Provider: Nobie Putnam *  Type: Established Patient  Specialty: Interventional Pain Management  PCP: Olin Hauser, DO  Location: Remote location  Delivery: TeleHealth     Virtual Encounter - Pain Management PROVIDER NOTE: Information contained herein reflects review and annotations entered in association with encounter. Interpretation of such information and data should be left to medically-trained personnel. Information provided to patient can be located elsewhere in the medical record under "Patient Instructions". Document created using STT-dictation technology, any transcriptional errors that may result from process are unintentional.    Contact & Pharmacy Preferred: [phone] Home: [phone] (home) Mobile: [phone] (mobile) E-mail: [email or web address]  [facility] [place], [address] [place] [address] Phone: [phone] Fax: [phone]  [facility] - [place], [place] - [address] [place] [address] Phone: [phone] Fax: [phone]   Pre-screening  Isaac Weber offered "in-person" vs "virtual" encounter. He indicated preferring virtual for this encounter.   Reason COVID-19*  Social distancing based on CDC and AMA recommendations.   I contacted Isaac Weber on 12/24/2021 via telephone.      I clearly identified myself as Gillis Santa, MD. I verified that I was speaking with the correct person using two identifiers (Name: Isaac Weber, and date of birth: August 15, 1945).  Consent I sought verbal advanced consent from Isaac Weber for virtual visit interactions. I informed Isaac Weber of possible security and privacy  concerns, risks, and limitations associated with providing "not-in-person" medical evaluation and management services. I also informed Isaac Weber of the availability of "in-person" appointments. Finally, I informed him that there would be a charge for the virtual visit and that he could be  personally, fully or partially, financially responsible for it. Isaac Weber expressed understanding and agreed to proceed.   Historic Elements   Isaac Weber is a 76 y.o. year old, male patient evaluated today after our last contact on 11/25/2021. Isaac Weber  has a past medical history of Abnormal chest CT, Actinic keratosis, Allergic rhinitis, unspecified, Anemia, Aortic atherosclerosis ([place]), BPH (benign prostatic hyperplasia), COPD (chronic obstructive pulmonary disease) ([place]), Coronary artery disease, Cough variant asthma, Glaucoma, Hypertension, Other chronic pain, Pancreatic lesion, Pure hypercholesterolemia, unspecified, SCC (squamous cell carcinoma) (03/10/2021), SCC (squamous cell carcinoma) (03/10/2021), 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 He also  has a past surgical history that includes Knee arthroscopy (Right, 1991); Bunionectomy (Left, 2003); Prostate surgery (2002); Rotator cuff repair (Left, 1999); Cervical disc surgery; Leg Surgery (Left); Rotator cuff repair (Right, 1996); Green light laser turp (transurethral resection of prostate (2010); Lumbar disc surgery (2010); Bunionectomy with hammertoe reconstruction (Left, 2013); Lumbar disc surgery (2011); Back surgery; Colonoscopy with propofol (N/A, 04/30/2021); Video bronchoscopy (N/A, 11/26/2021); and Bronchial washings (11/26/2021). Isaac Weber has a current medication list which includes the following prescription(s): albuterol, amlodipine, baclofen, one touch ultra 2, budesonide, ciprofloxacin, ferrous sulfate, fluticasone, gabapentin, glucosamine-chondroitin, hydrochlorothiazide, ibuprofen,  ketoconazole, onetouch ultrasoft, loratadine, losartan, metformin, asmanex hfa, multiple minerals-vitamins, multivitamin with minerals, onetouch ultra, rosuvastatin, sodium chloride hypertonic, tamsulosin, and spiriva respimat. He  reports that he quit smoking about 50 years ago. His smoking use included cigarettes. He has a  78.00 pack-year smoking history. He has never used smokeless tobacco. He reports current alcohol use of about 2.0 standard drinks per week. He reports that he does not use drugs. Isaac Weber is allergic to morphine and related.   HPI  Today, he is being contacted for a post-procedure assessment.   Post-procedure evaluation   Type: Trans-Foraminal Epidural Steroid Injection (Lumbar) #1  Laterality: Right  Level: L4 & L5  Imaging: Fluoroscopic guidance Anesthesia: Local anesthesia (1-2% Lidocaine) Anxiolysis: None                 Sedation: None. DOS: 11/25/2021  Performed by: Gillis Santa, MD  Purpose: Diagnostic/Therapeutic Indications: Lumbar radicular pain severe enough to impact quality of life or function. 1. 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 Chronic radicular lumbar pain   3. Lumbar radiculopathy   4. 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 Chronic pain syndrome    NAS-11 Pain score:   Pre-procedure: 2 /10   Post-procedure: 4 /10      Effectiveness:  Initial hour after procedure: 90 %  Subsequent 4-6 hours post-procedure: 90 %  Analgesia past initial 6 hours: 25 %  Ongoing improvement:  Analgesic:  25-30% Function: No benefit ROM: No benefit   Laboratory Chemistry Profile   Renal Lab Results  Component Value Date   BUN 14 12/03/2021   CREATININE 0.64 54/56/2563   BCR NOT APPLICABLE 89/37/3428   GFRAA 102 06/02/2020   GFRNONAA >60 12/03/2021    Hepatic Lab Results  Component Value Date   AST 17 12/03/2021   ALT 22 12/03/2021   ALBUMIN 3.7 12/03/2021   ALKPHOS 71 12/03/2021    Electrolytes Lab  Results  Component Value Date   NA 132 (L) 12/03/2021   K 3.4 (L) 12/03/2021   CL 98 12/03/2021   CALCIUM 9.4 12/03/2021    Bone No results found for: VD25OH, JG811XB2IOM, BT5974BU3, AG5364WO0, 25OHVITD1, 25OHVITD2, 25OHVITD3, TESTOFREE, TESTOSTERONE  Inflammation (CRP: Acute Phase) (ESR: Chronic Phase) Lab Results  Component Value Date   CRP 0.7 12/03/2021         Note: Above Lab results reviewed.  Imaging  DG PAIN CLINIC C-ARM 1-60 MIN NO REPORT Fluoro was used, but no Radiologist interpretation will be provided.  Please refer to "NOTES" tab for provider progress note.  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 Diagnoses of Chronic radicular lumbar pain, Lumbar radiculopathy, and Chronic pain syndrome were also pertinent to this visit.  Plan of Care  Mild response to right L4, L5 transforaminal epidural steroid injection.  States that pain quality is different and that intensity is somewhat less.  Discussed repeating the injection however discussed employing an interlaminar approach to see if that has a better impact on his pain radiation into his leg.  Risk and benefits reviewed and patient like to proceed.    Orders:  Orders Placed This Encounter  Procedures   Lumbar Epidural Injection    Standing Status:   Future    Standing Expiration Date:   01/23/2022    Scheduling Instructions:     Procedure: Interlaminar Lumbar Epidural Steroid injection (LESI)            Laterality: Midline- L4/5     Sedation: without     Timeframe: ASAA    Order Specific Question:   Where will this procedure be performed?    Answer:   [facility]  Follow-up plan:   Return in about 20 days (around 01/13/2022) for R L4/5 ESI, in clinic NS.     R L4 +L5 TF ESI 11/25/21    Recent Visits Date Type Provider Dept  11/25/21 Procedure visit Gillis Santa, MD [facility]  11/17/21 Office Visit Gillis Santa, MD [facility]  10/27/21 Office Visit Gillis Santa, MD [facility]  Showing recent visits within past 90 days and meeting all other requirements Today's Visits Date Type Provider Dept  12/24/21 Office Visit Gillis Santa, MD [facility]  Showing today's visits and meeting all other requirements Future Appointments No visits were found meeting these conditions. Showing future appointments within next 90 days and meeting all other requirements  I discussed the assessment and treatment plan with the patient. The patient was provided an opportunity to ask questions and all were answered. The patient agreed with the plan and demonstrated an understanding of the instructions.  Patient advised to call back or seek an in-person evaluation if the symptoms or condition worsens.  Duration of encounter: 55mnutes.  Note by: BGillis Santa MD Date: 12/24/2021; Time: 3:22 PM

## 2021-12-25 NOTE — Telephone Encounter (Signed)
So he has been taking budesonide nebulizer twice daily and it is not helping??? How was his cough on asmanex inhaler, I know this was not covered by his insurance but we could send in a similar inhaler instead of nebulizers if those are not working   If he has not tried [employer] we can also send in those '200mg'$  three times a day as needed for cough suppression

## 2021-12-28 ENCOUNTER — Encounter: Payer: Self-pay | Admitting: Dermatology

## 2021-12-28 ENCOUNTER — Ambulatory Visit: Payer: PPO | Admitting: Internal Medicine

## 2021-12-29 ENCOUNTER — Ambulatory Visit: Payer: PPO | Attending: Infectious Diseases | Admitting: Infectious Diseases

## 2021-12-29 ENCOUNTER — Other Ambulatory Visit
Admission: RE | Admit: 2021-12-29 | Discharge: 2021-12-29 | Disposition: A | Discharge status: Home / Self Care | Payer: PPO | Attending: Infectious Diseases | Admitting: Infectious Diseases

## 2021-12-29 ENCOUNTER — Encounter: Payer: Self-pay | Admitting: Infectious Diseases

## 2021-12-29 VITALS — BP 114/70 | HR 103 | Temp 97.9°F | Wt 163.0 lb

## 2021-12-29 DIAGNOSIS — Z87891 Personal history of nicotine dependence: Secondary | ICD-10-CM | POA: Diagnosis not present

## 2021-12-29 DIAGNOSIS — J470 Bronchiectasis with acute lower respiratory infection: Secondary | ICD-10-CM | POA: Insufficient documentation

## 2021-12-29 DIAGNOSIS — J432 Centrilobular emphysema: Secondary | ICD-10-CM | POA: Diagnosis not present

## 2021-12-29 DIAGNOSIS — R053 Chronic cough: Secondary | ICD-10-CM | POA: Diagnosis not present

## 2021-12-29 LAB — CBC WITH DIFFERENTIAL/PLATELET
Abs Immature Granulocytes: 0.04 10*3/uL (ref 0.00–0.07)
Basophils Absolute: 0 10*3/uL (ref 0.0–0.1)
Basophils Relative: 1 %
Eosinophils Absolute: 0.2 10*3/uL (ref 0.0–0.5)
Eosinophils Relative: 2 %
HCT: 36.6 % — ABNORMAL LOW (ref 39.0–52.0)
Hemoglobin: 13.1 g/dL (ref 13.0–17.0)
Immature Granulocytes: 1 %
Lymphocytes Relative: 16 %
Lymphs Abs: 1.2 10*3/uL (ref 0.7–4.0)
MCH: 31.7 pg (ref 26.0–34.0)
MCHC: 35.8 g/dL (ref 30.0–36.0)
MCV: 88.6 fL (ref 80.0–100.0)
Monocytes Absolute: 0.7 10*3/uL (ref 0.1–1.0)
Monocytes Relative: 9 %
Neutro Abs: 5.7 10*3/uL (ref 1.7–7.7)
Neutrophils Relative %: 71 %
Platelets: 193 10*3/uL (ref 150–400)
RBC: 4.13 MIL/uL — ABNORMAL LOW (ref 4.22–5.81)
RDW: 12.6 % (ref 11.5–15.5)
WBC: 7.9 10*3/uL (ref 4.0–10.5)
nRBC: 0 % (ref 0.0–0.2)

## 2021-12-29 LAB — COMPREHENSIVE METABOLIC PANEL
ALT: 22 U/L (ref 0–44)
AST: 23 U/L (ref 15–41)
Albumin: 3.8 g/dL (ref 3.5–5.0)
Alkaline Phosphatase: 70 U/L (ref 38–126)
Anion gap: 9 (ref 5–15)
BUN: 16 mg/dL (ref 8–23)
CO2: 25 mmol/L (ref 22–32)
Calcium: 9.2 mg/dL (ref 8.9–10.3)
Chloride: 101 mmol/L (ref 98–111)
Creatinine, Ser: 0.86 mg/dL (ref 0.61–1.24)
GFR, Estimated: >60 mL/min (ref >60)
Glucose, Bld: 224 mg/dL — ABNORMAL HIGH (ref 70–99)
Potassium: 3.2 mmol/L — ABNORMAL LOW (ref 3.5–5.1)
Sodium: 135 mmol/L (ref 135–145)
Total Bilirubin: 1.1 mg/dL (ref 0.3–1.2)
Total Protein: 6.8 g/dL (ref 6.5–8.1)

## 2021-12-29 MED ORDER — CIPROFLOXACIN HCL 500 MG PO TABS: 500.0000 mg | ORAL_TABLET | Freq: Two times a day (BID) | ORAL | 0 refills | Status: DC

## 2021-12-29 NOTE — Patient Instructions (Signed)
You are here for follow up of the bronchiectasis sna dlower resp infection with 3 organisms- your phlegm has almost cleared- cough is better but still you have many triggers- your sleep seem to be bettier Continue ciprofloxacin- will reduce to '500mg'$  Po BID. Will do labs today- follow up 1 month

## 2021-12-29 NOTE — Progress Notes (Signed)
NAME: Isaac Weber  DOB: 1946-03-19  MRN: [id number]  Date/Time: 12/29/2021 10:57 AM   Subjective:  Pt is here with his wife  Here for follow up- last saw him on 12/03/21 for lower resp infeciton with pseudomonas, serratia and strep pneumo Was started on cipro 711m PO BID- after nearly 7-10 days of taking the antibiotic he is getting relief- phlegm is minimal and if present it is clear- cough is better but can be incessant at times- sleep is better No diarrhea No pain abdomen Appetite fair No pain in joints, muscles or ligamants that is worse since starting cipro He is also doing hypertonic saline nebs to loosen the phlegm and has budesonide inhaler He has not seen pulmonary since I last saw him HE saw dermatologist and had cryotherapy tp one of the sun spots   Following taken from previous note MREDA GETTISis a 76y.o. male is referred to me for persistent cough with purulent sputum for which he underwent bronchoscopy and bronchoalveolar lavage cultures were sent on 11/26/2021.  The culture is positive for Pseudomonas, Serratia and strep pneumo.  He is referred to me for antibiotic management. On reviewing his medical records completely it is noted that patient has had cough since 2018 and initially was followed by his PCP.  He was treated for acid reflux with Nexium and as it did not make any difference [facility]  He saw Dr. RChase Calleron 07/08/2017.  He was given a short course of prednisone.  He was also put on bronchodilator.  He was diagnosed as having cough variant asthma.  He was seeing him every 6 months.  It looks like the cough was improving and worsening intermittently. High-resolution CT scan was done Aug 2020 and that showed mild paraseptal emphysema, mild diffuse bronchial wall thickening and mild saber-sheath trachea configuration suggesting COPD. Patient took COVID-vaccine on 02/13/2020 and 03/05/2020 and from October 2021 for cough  became worse with increasing purulent sputum.  His PCP gave him on course of prednisone and azithromycin May 19, 2020.  I started him on albuterol as needed and asked him to continue Asmanex inhaler.  He had another high-resolution CT of the chest on 07/24/2020 and it showed mild centrilobular and paraseptal emphysema with diffuse bronchial wall thickening and saber-sheath trachea compatible with COPD.  Stable 4 mm left upper lobe solid pulmonary nodule consistent with benign. [facility]. As the cough was persisting and kept him up at night and he had postnasal drip he was referred to ENT.  He has seen GI as well but no endoscopy was done but he was given a trial of PPI. He had CT sinus March 17, 2021 and that showed mucosal thickening in the right frontal sinus, ethmoid air cells and inferior maxillary sinus.  The right frontoethmoidal recess and left ostiomeatal units were occluded. He also had a high-resolution CT chest done on 03/16/2021 and 2 to 3 mm pulmonary nodules scattered throughout the periphery of the lungs bilaterally nonspecific with benign areas of mucoid impaction within the terminal bronchioles.  Along with previously seen mild central lobar and paraseptal emphysema.   [facility]  Underwent flexible laryngoscopy that showed patent anterior nasal cavity, nasopharynx with yellow mucopurulence and normal base of tongue and supraglottis.  Normal vocal cord mobility without vocal cord nodule mass polyp or tumor.  He was prescribed a  14-day course of clindamycin 300 mg TID.  He had repeat CT sinus on 10/05/21 and it showed Resolution of the sinus inflammatory changes. He saw Dr. Lind Guest on 10/23/2021 and she prescribed gabapentin and suggested bronchoscopy. HE underwent bronch on 5/4/2 and bronc showed RMB and the PI and RLL full of green mucus that was thick and difficult to suction.  The  procedure was terminated because of excessive cough.  So the right lung was only visualized.  BAL culture showed serratia, pseudomonas and Strep pneumo and was referred  to me and I saw him on 5/11 and started cipro  Past Medical History:  Diagnosis Date   Abnormal chest CT    Actinic keratosis    Allergic rhinitis, unspecified    Anemia    Aortic atherosclerosis ([facility])    BPH (benign prostatic hyperplasia)    COPD (chronic obstructive pulmonary disease) ([facility])    Coronary artery disease    Cough variant asthma    Glaucoma    Hypertension    Other chronic pain    Pancreatic lesion    Pure hypercholesterolemia, unspecified    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 WD SCC, ulcerated. [facility] 04/29/2020    Past Surgical History:  Procedure Laterality Date   BACK SURGERY     BRONCHIAL WASHINGS  11/26/2021   Procedure: BRONCHIAL WASHINGS;  Surgeon: Brand Males, MD;  Location: [facility];  Service: Endoscopy;;   BUNIONECTOMY Left 2003   hammer toe as well   BUNIONECTOMY WITH HAMMERTOE RECONSTRUCTION Left 2013   repeat   CERVICAL DISC SURGERY     COLONOSCOPY WITH PROPOFOL N/A 04/30/2021   Procedure: COLONOSCOPY WITH PROPOFOL;  Surgeon: Jonathon Bellows, MD;  Location: [facility];  Service: Gastroenterology;  Laterality: N/A;   GREEN LIGHT LASER TURP (TRANSURETHRAL RESECTION OF PROSTATE  2010   laser, shrink prostate   KNEE ARTHROSCOPY Right 1991   LEG SURGERY Left    BENIGN BONE TUMOR   LUMBAR [place] SURGERY  2010   discectomy   LUMBAR DISC SURGERY  2011   PROSTATE SURGERY  2002   shrink prostate   ROTATOR CUFF REPAIR Left 1999   debride, remove bonespur   ROTATOR CUFF REPAIR Right 1996   VIDEO  BRONCHOSCOPY N/A 11/26/2021   Procedure: VIDEO BRONCHOSCOPY WITHOUT FLUORO;  Surgeon: Brand Males, MD;  Location: [facility];  Service: Endoscopy;  Laterality: N/A;  for chroni ccough BAL    Social History   Socioeconomic History   Marital status: Married    Spouse name: Shaquille Janes   Number of children: Not on file   Years of education: 12   Highest education level: High school graduate  Occupational History   Occupation: retired  Tobacco Use   Smoking status: Former    Packs/day: 3.00    Years: 26.00    Pack years: 78.00    Types: Cigarettes    Quit date: 09/17/1971    Years since quitting: 50.3   Smokeless tobacco: Never  Vaping Use   Vaping Use: Never used  Substance and Sexual Activity   Alcohol use: Yes    Alcohol/week: 2.0 standard drinks    Types: 2 Standard drinks or  equivalent per week   Drug use: Never   Sexual activity: Yes    Partners: Female  Other Topics Concern   Not on file  Social History Narrative   Not on file   Social Determinants of Health   Financial Resource Strain: Low Risk    Difficulty of Paying Living Expenses: Not hard at all  Food Insecurity: No Food Insecurity   Worried About Charity fundraiser in the Last Year: Never true   [place] in the Last Year: Never true  Transportation Needs: No Transportation Needs   Lack of Transportation (Medical): No   Lack of Transportation (Non-Medical): No  Physical Activity: Insufficiently Active   Days of Exercise per Week: 7 days   Minutes of Exercise per Session: 20 min  Stress: No Stress Concern Present   Feeling of Stress : Not at all  Social Connections: Not on file  Intimate Partner Violence: Not on file    Family History  Problem Relation Age of Onset   Heart disease Mother 83   Emphysema Father 23   Heart disease Brother 24   Allergies  Allergen Reactions   Morphine And Related Diarrhea and Nausea And Vomiting   I? Current Outpatient Medications  Medication Sig  Dispense Refill   albuterol (VENTOLIN HFA) 108 (90 Base) MCG/ACT inhaler Inhale 1-2 puffs into the lungs every 4 (four) hours as needed for wheezing or shortness of breath (cough). 1 each 2   amLODipine (NORVASC) 5 MG tablet TAKE ONE TABLET BY MOUTH ONCE DAILY 90 tablet 0   baclofen (LIORESAL) 10 MG tablet Take 0.5-1 tablets (5-10 mg total) by mouth 2 (two) times daily as needed for muscle spasms. 60 each 2   Blood Glucose Monitoring Suppl (ONE TOUCH ULTRA 2) w/Device KIT Use to check blood sugar up to 2 times daily 1 kit 0   budesonide (PULMICORT) 0.5 MG/2ML nebulizer solution Take 2 mLs (0.5 mg total) by nebulization daily. 150 mL 0   ciprofloxacin (CIPRO) 750 MG tablet Take 1 tablet (750 mg total) by mouth 2 (two) times daily. 60 tablet 0   ferrous sulfate 325 (65 FE) MG tablet Take 325 mg by mouth daily with breakfast.     fluticasone (FLONASE) 50 MCG/ACT nasal spray USE ONE SPRAY IN EACH NOSTRIL DAILY 48 g 3   gabapentin (NEURONTIN) 100 MG capsule Take 100 mg by mouth 3 (three) times daily.     Glucosamine-Chondroitin (GLUCOSAMINE CHONDR COMPLEX PO) Take 1 capsule by mouth 2 (two) times daily.     hydrochlorothiazide (HYDRODIURIL) 12.5 MG tablet TAKE ONE TABLET BY MOUTH ONCE DAILY 90 tablet 0   ibuprofen (ADVIL) 800 MG tablet Take 1 tablet (800 mg total) by mouth 3 (three) times daily. 90 tablet 0   ketoconazole (NIZORAL) 2 % shampoo SHAMPOO INTO SCALP, LET SIT FOR 10 MINUTES THEN WASH OFF. USE 3 TIMES PER WEEK (Patient taking differently: Apply 1 application. topically daily.) 120 mL 3   Lancets (ONETOUCH ULTRASOFT) lancets Use to check blood sugar up to twice per day 200 each 5   loratadine (CLARITIN) 10 MG tablet Take 10 mg by mouth in the morning.     losartan (COZAAR) 50 MG tablet TAKE ONE TABLET BY MOUTH ONCE DAILY 90 tablet 0   metFORMIN (GLUCOPHAGE-XR) 500 MG 24 hr tablet TAKE ONE TABLET BY MOUTH EVERYDAY AT BEDTIME 90 tablet 0   Mometasone Furoate (ASMANEX HFA) 200 MCG/ACT AERO Inhale  2 puffs into the lungs in the  morning and at bedtime. 13 g 11   Multiple Minerals-Vitamins (CAL MAG ZINC +D3 PO) Take 1 tablet by mouth every evening.     Multiple Vitamin (MULTIVITAMIN WITH MINERALS) TABS tablet Take 1 tablet by mouth daily.     ONETOUCH ULTRA test strip Use to check blood sugar up to twice per day 200 each 5   rosuvastatin (CRESTOR) 5 MG tablet TAKE ONE TABLET BY MOUTH ONCE DAILY (Patient taking differently: Take 5 mg by mouth every evening.) 90 tablet 1   sodium chloride HYPERTONIC 3 % nebulizer solution Take by nebulization as needed for other. 750 mL 12   tamsulosin (FLOMAX) 0.4 MG CAPS capsule Take 1 capsule (0.4 mg total) by mouth daily. (Patient taking differently: Take 0.4 mg by mouth every evening.) 90 capsule 3   Tiotropium Bromide Monohydrate (SPIRIVA RESPIMAT) 2.5 MCG/ACT AERS Inhale 2 puffs into the lungs daily. 4 g 0   No current facility-administered medications for this visit.     Abtx:  Anti-infectives (From admission, onward)    None       REVIEW OF SYSTEMS:  Const: negative fever, negative chills, someweight loss Eyes: negative diplopia or visual changes, negative eye pain ENT: negative coryza, negative sore throat, has hoarse voice from coughing Resp:+  cough, sputum cleared up Cards: Chest pain due to coughing  no palpitations, lower extremity edema GU: negative for frequency, dysuria and hematuria GI: Negative for abdominal pain, diarrhea, bleeding, constipation Skin: negative for rash and pruritus Heme: negative for easy bruising and gum/nose bleeding MS: Has back pain and joint pain  Neurolo:negative for headaches, dizziness, vertigo, memory problems  Psych: anxiety, depression due to the persistent cough Endocrine: negative for thyroid, diabetes Allergy/Immunology-morphine related products. He is able to tolerate Cipro well. Objective:  VITALS:  BP 114/70   Pulse (!) 103   Temp 97.9 F (36.6 C) (Temporal)   Wt 163 lb (73.9 kg)    SpO2 96%   BMI 24.07 kg/m   PHYSICAL EXAM:  General: Alert, cooperative, no distress, appears stated age.  Thin built Head: Normocephalic, without obvious abnormality, atraumatic. Eyes: Conjunctivae clear, anicteric sclerae. Pupils are equal ENT Nares normal. No drainage or sinus tenderness. Lips, mucosa, and tongue normal. No Thrush Neck: Supple, symmetrical, no adenopathy, thyroid: non tender no carotid bruit and no JVD. Back: No CVA tenderness. Lungs: Bilateral air entry. No rhonchi Minimal crepts bases Heart: Regular rate and rhythm, no murmur, rub or gallop. Abdomen:did not examine Extremities: atraumatic, no cyanosis. No edema. No clubbing Skin: No rashes or lesions. Or bruising Lymph: Cervical, supraclavicular normal. Neurologic: Grossly non-focal Pertinent Labs IgG, IgA, IgM and IgA normal HIV antibody negative ANA positive 1:40 cytoplasmic dsDNA negative Rheumatoid factor negative pneumocystis smear negative Cytology negative Resp panel neg   IMAGING RESULTS: 03/16/2021 HRCT Mild diffuse bronchial wall thickening with mild central lobar and paraseptal emphysema.  Multiple tiny 2 to 3 mm pulmonary nodules scattered throughout the periphery of the lungs bilaterally.  May represent areas of mucoid impaction with thin terminal bronchioles. I have personally reviewed the films ? Impression/Recommendation Chronic cough with sputum production Serratia, pseudomonas and strep pneum in BAL culture Centrilobar emphysema  Likely  Bronchiectasis with  RAD Started on cipro  768m PO BID on 5/12 Doing better Will complete 30 days next week- will continue at a reduced dose of 5019mPo BID for another month Will get labs today Follow up 1 month  Note:  This document was prepared using Dragon voice recognition software  and may include unintentional dictation errors.

## 2021-12-30 ENCOUNTER — Encounter: Payer: Self-pay | Admitting: Urology

## 2021-12-30 ENCOUNTER — Telehealth: Payer: Self-pay

## 2021-12-30 ENCOUNTER — Ambulatory Visit: Payer: PPO | Admitting: Urology

## 2021-12-30 ENCOUNTER — Encounter: Payer: Self-pay | Admitting: Family Medicine

## 2021-12-30 VITALS — BP 130/73 | HR 103 | Ht 69.0 in | Wt 159.0 lb

## 2021-12-30 DIAGNOSIS — R339 Retention of urine, unspecified: Secondary | ICD-10-CM

## 2021-12-30 DIAGNOSIS — R351 Nocturia: Secondary | ICD-10-CM

## 2021-12-30 DIAGNOSIS — N401 Enlarged prostate with lower urinary tract symptoms: Secondary | ICD-10-CM | POA: Diagnosis not present

## 2021-12-30 DIAGNOSIS — E876 Hypokalemia: Secondary | ICD-10-CM

## 2021-12-30 LAB — FUNGUS CULTURE WITH STAIN

## 2021-12-30 LAB — FUNGUS CULTURE RESULT

## 2021-12-30 LAB — BLADDER SCAN AMB NON-IMAGING

## 2021-12-30 LAB — FUNGAL ORGANISM REFLEX

## 2021-12-30 MED ORDER — POTASSIUM CHLORIDE CRYS ER 10 MEQ PO TBCR: 10.0000 meq | EXTENDED_RELEASE_TABLET | Freq: Every day | ORAL | 1 refills | Status: DC

## 2021-12-30 NOTE — Patient Instructions (Signed)

## 2021-12-30 NOTE — Telephone Encounter (Signed)
Patient advised of lab results and verbalized understanding.  Isaac Weber

## 2021-12-30 NOTE — Telephone Encounter (Signed)
-----   Message from Tsosie Billing, MD sent at 12/30/2021  8:28 AM EDT ----- Please let him know that his K is on the lower side like always- he will have to contact his PCP. Thx other labs okay except blood glucose is also high  ----- Message ----- From: Interface, Lab In [place] Sent: 12/29/2021  11:32 AM EDT To: Tsosie Billing, MD

## 2021-12-30 NOTE — Progress Notes (Signed)
   12/30/2021 11:24 AM   Isaac Weber January 05, 1946 [id number]  Reason for visit: Follow up BPH, incomplete bladder  HPI: Healthy 76 year old male who I originally saw in March 2022.  He has a history of BPH and previously underwent a TUNA procedure as well as a greenlight laser PVP with an outside urologist.  These temporarily improved his urinary symptoms.  His primary urinary complaint is some frequency during the day and nocturia 3 times at night.  PVR previously was mildly elevated at 160 mL, and remains mildly elevated at 180 mL today.  He is minimally bothered by urinary symptoms, with IPSS score today of 12.  We had previously discussed cystoscopy or prostate ultrasound to consider outlet procedures, but he deferred.  PSA last year was 4.07 which was within the normal range for his age, and he opted to discontinue screening per the AUA guidelines.  Return precautions were discussed including worsening urinary symptoms, UTIs, or urinary retention which would prompt cystoscopy/TRUS for consideration of an outlet procedure.  RTC 1 year PVR  [name], MD  [facility] [address] [place], [place] [address] [phone]

## 2022-01-03 ENCOUNTER — Encounter: Payer: Self-pay | Admitting: Student in an Organized Health Care Education/Training Program

## 2022-01-04 ENCOUNTER — Encounter: Payer: Self-pay | Admitting: Physical Therapy

## 2022-01-06 ENCOUNTER — Encounter: Payer: Self-pay | Admitting: Internal Medicine

## 2022-01-07 ENCOUNTER — Ambulatory Visit: Payer: PPO | Admitting: Primary Care

## 2022-01-11 DIAGNOSIS — R053 Chronic cough: Secondary | ICD-10-CM | POA: Diagnosis not present

## 2022-01-11 DIAGNOSIS — J4520 Mild intermittent asthma, uncomplicated: Secondary | ICD-10-CM | POA: Diagnosis not present

## 2022-01-11 LAB — ACID FAST CULTURE WITH REFLEXED SENSITIVITIES (MYCOBACTERIA): Acid Fast Culture: NEGATIVE

## 2022-01-12 NOTE — Telephone Encounter (Signed)
Best to address with [facility] 01/15/22. [name] and I will discuss during/after visit.

## 2022-01-13 ENCOUNTER — Ambulatory Visit: Payer: PPO | Admitting: Student in an Organized Health Care Education/Training Program

## 2022-01-15 ENCOUNTER — Other Ambulatory Visit (INDEPENDENT_AMBULATORY_CARE_PROVIDER_SITE_OTHER): Payer: PPO

## 2022-01-15 ENCOUNTER — Encounter: Payer: Self-pay | Admitting: Primary Care

## 2022-01-15 ENCOUNTER — Ambulatory Visit: Payer: PPO | Admitting: Primary Care

## 2022-01-15 VITALS — BP 110/68 | HR 96 | Temp 97.9°F | Ht 69.0 in | Wt 164.2 lb

## 2022-01-15 DIAGNOSIS — J45991 Cough variant asthma: Secondary | ICD-10-CM

## 2022-01-15 DIAGNOSIS — R600 Localized edema: Secondary | ICD-10-CM

## 2022-01-15 DIAGNOSIS — J470 Bronchiectasis with acute lower respiratory infection: Secondary | ICD-10-CM

## 2022-01-15 DIAGNOSIS — J432 Centrilobular emphysema: Secondary | ICD-10-CM | POA: Diagnosis not present

## 2022-01-15 DIAGNOSIS — M7989 Other specified soft tissue disorders: Secondary | ICD-10-CM | POA: Diagnosis not present

## 2022-01-15 LAB — BASIC METABOLIC PANEL
BUN: 15 mg/dL (ref 6–23)
CO2: 30 mEq/L (ref 19–32)
Calcium: 9.8 mg/dL (ref 8.4–10.5)
Chloride: 101 mEq/L (ref 96–112)
Creatinine, Ser: 0.91 mg/dL (ref 0.40–1.50)
GFR: 81.65 mL/min (ref >60.00)
Glucose, Bld: 175 mg/dL — ABNORMAL HIGH (ref 70–99)
Potassium: 3.9 mEq/L (ref 3.5–5.1)
Sodium: 138 mEq/L (ref 135–145)

## 2022-01-15 LAB — BRAIN NATRIURETIC PEPTIDE: Pro B Natriuretic peptide (BNP): 13 pg/mL (ref 0.0–100.0)

## 2022-01-18 DIAGNOSIS — R600 Localized edema: Secondary | ICD-10-CM | POA: Insufficient documentation

## 2022-01-18 DIAGNOSIS — J471 Bronchiectasis with (acute) exacerbation: Secondary | ICD-10-CM | POA: Insufficient documentation

## 2022-01-18 DIAGNOSIS — J479 Bronchiectasis, uncomplicated: Secondary | ICD-10-CM | POA: Insufficient documentation

## 2022-01-18 DIAGNOSIS — J470 Bronchiectasis with acute lower respiratory infection: Secondary | ICD-10-CM | POA: Insufficient documentation

## 2022-01-18 NOTE — Assessment & Plan Note (Addendum)
-   Chronic cough. Patient underwent bronchoscopy on 11/26/21, BAL grew out three separate microorganisms (Pseudomonas, streptococcus pneumonia and serratia). Following with infectious disease, started on oral ciprofloxacin. He has seen significant improvement in his cough and amount of congestion. Continue hypertonic saline nebulizer as needed for increased chest congestion.  We will add flutter valve 3 times daily to loosen congestion.  May consider adding therapy vest in the future to help mobilize secretions if patient continues with cough.

## 2022-01-25 ENCOUNTER — Encounter: Payer: Self-pay | Admitting: Family Medicine

## 2022-01-27 NOTE — Telephone Encounter (Signed)
Please advise 

## 2022-01-28 ENCOUNTER — Encounter: Payer: Self-pay | Admitting: Family Medicine

## 2022-01-28 ENCOUNTER — Other Ambulatory Visit
Admission: RE | Admit: 2022-01-28 | Discharge: 2022-01-28 | Disposition: A | Discharge status: Home / Self Care | Payer: PPO | Attending: Infectious Diseases | Admitting: Infectious Diseases

## 2022-01-28 ENCOUNTER — Telehealth: Payer: Self-pay

## 2022-01-28 ENCOUNTER — Encounter: Payer: Self-pay | Admitting: Infectious Diseases

## 2022-01-28 ENCOUNTER — Ambulatory Visit: Payer: PPO | Attending: Infectious Diseases | Admitting: Infectious Diseases

## 2022-01-28 VITALS — BP 142/82 | HR 91 | Temp 98.6°F | Ht 69.0 in | Wt 166.0 lb

## 2022-01-28 DIAGNOSIS — J479 Bronchiectasis, uncomplicated: Secondary | ICD-10-CM | POA: Diagnosis not present

## 2022-01-28 DIAGNOSIS — B965 Pseudomonas (aeruginosa) (mallei) (pseudomallei) as the cause of diseases classified elsewhere: Secondary | ICD-10-CM | POA: Diagnosis not present

## 2022-01-28 LAB — CBC WITH DIFFERENTIAL/PLATELET
Abs Immature Granulocytes: 0.04 10*3/uL (ref 0.00–0.07)
Basophils Absolute: 0 10*3/uL (ref 0.0–0.1)
Basophils Relative: 1 %
Eosinophils Absolute: 0.3 10*3/uL (ref 0.0–0.5)
Eosinophils Relative: 4 %
HCT: 38.2 % — ABNORMAL LOW (ref 39.0–52.0)
Hemoglobin: 13.5 g/dL (ref 13.0–17.0)
Immature Granulocytes: 1 %
Lymphocytes Relative: 17 %
Lymphs Abs: 1.3 10*3/uL (ref 0.7–4.0)
MCH: 31 pg (ref 26.0–34.0)
MCHC: 35.3 g/dL (ref 30.0–36.0)
MCV: 87.6 fL (ref 80.0–100.0)
Monocytes Absolute: 0.7 10*3/uL (ref 0.1–1.0)
Monocytes Relative: 9 %
Neutro Abs: 5.2 10*3/uL (ref 1.7–7.7)
Neutrophils Relative %: 68 %
Platelets: 183 10*3/uL (ref 150–400)
RBC: 4.36 MIL/uL (ref 4.22–5.81)
RDW: 12.2 % (ref 11.5–15.5)
WBC: 7.6 10*3/uL (ref 4.0–10.5)
nRBC: 0 % (ref 0.0–0.2)

## 2022-01-28 LAB — COMPREHENSIVE METABOLIC PANEL
ALT: 23 U/L (ref 0–44)
AST: 24 U/L (ref 15–41)
Albumin: 3.9 g/dL (ref 3.5–5.0)
Alkaline Phosphatase: 66 U/L (ref 38–126)
Anion gap: 5 (ref 5–15)
BUN: 15 mg/dL (ref 8–23)
CO2: 27 mmol/L (ref 22–32)
Calcium: 9.6 mg/dL (ref 8.9–10.3)
Chloride: 103 mmol/L (ref 98–111)
Creatinine, Ser: 0.81 mg/dL (ref 0.61–1.24)
GFR, Estimated: >60 mL/min (ref >60)
Glucose, Bld: 223 mg/dL — ABNORMAL HIGH (ref 70–99)
Potassium: 4 mmol/L (ref 3.5–5.1)
Sodium: 135 mmol/L (ref 135–145)
Total Bilirubin: 1 mg/dL (ref 0.3–1.2)
Total Protein: 6.8 g/dL (ref 6.5–8.1)

## 2022-01-28 NOTE — Progress Notes (Signed)
NAME: Isaac Weber  DOB: Oct 13, 1945  MRN: [id number]  Date/Time: 01/28/2022 9:49 AM   Subjective:  Pt is here with his wife  Patient here for follow-up for bronchiectasis and infection with Pseudomonas, Serratia and strep pneumo.  He has been on 2 months of ciprofloxacin.  Started around 12/04/2021  he will complete on 02/03/2022 He is doing much better The cough is much improved.  Clear sputum but not much He is able to sleep well at night He is also getting nebulized hypertonic saline.  He was complaining of a leg swelling which was likely thought due to hypertonic saline. He saw his pulmonologist yesterday who did not think it was the case.  BNP is normal.  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 WD SCC, ulcerated. [facility] 04/29/2020    Past Surgical History:  Procedure Laterality Date   BACK SURGERY     BRONCHIAL WASHINGS  11/26/2021   Procedure: BRONCHIAL WASHINGS;  Surgeon: Brand Males, MD;  Location: [facility] ENDOSCOPY;  Service: Endoscopy;;   BUNIONECTOMY Left 2003   hammer toe as well   BUNIONECTOMY WITH HAMMERTOE RECONSTRUCTION Left 2013   repeat   CERVICAL DISC SURGERY     COLONOSCOPY WITH PROPOFOL N/A 04/30/2021   Procedure: COLONOSCOPY WITH PROPOFOL;  Surgeon: Jonathon Bellows, MD;   Location: [facility] ENDOSCOPY;  Service: Gastroenterology;  Laterality: N/A;   GREEN LIGHT LASER TURP (TRANSURETHRAL RESECTION OF PROSTATE  2010   laser, shrink prostate   KNEE ARTHROSCOPY Right 1991   LEG SURGERY Left    BENIGN BONE TUMOR   LUMBAR [place] SURGERY  2010   discectomy   LUMBAR DISC SURGERY  2011   PROSTATE SURGERY  2002   shrink prostate   ROTATOR CUFF REPAIR Left 1999   debride, remove bonespur   ROTATOR CUFF REPAIR Right 1996   VIDEO BRONCHOSCOPY N/A 11/26/2021   Procedure: VIDEO BRONCHOSCOPY WITHOUT FLUORO;  Surgeon: Brand Males, MD;  Location: [facility] ENDOSCOPY;  Service: Endoscopy;  Laterality: N/A;  for chroni ccough BAL    Social History   Socioeconomic History   Marital status: Married    Spouse name: Jihad Brownlow   Number of children: Not on file   Years of education: 12   Highest education level: High school graduate  Occupational History   Occupation: retired  Tobacco Use   Smoking status: Former    Packs/day: 3.00    Years: 26.00    Total pack years: 78.00    Types: Cigarettes    Quit date: 09/17/1971    Years since quitting: 50.4    Passive exposure: Past   Smokeless tobacco: Never  Vaping Use   Vaping Use: Never used  Substance and Sexual Activity   Alcohol use: Yes    Alcohol/week: 2.0 standard drinks of alcohol    Types: 2 Standard drinks or equivalent per week   Drug use: Never   Sexual activity: Yes    Partners: Female  Other Topics Concern   Not on file  Social History Narrative   Not on file   Social Determinants of Health   Financial Resource Strain: Low Risk  (04/07/2021)   Overall Financial Resource Strain (CARDIA)    Difficulty of Paying Living Expenses: Not hard at all  Food Insecurity: No Food Insecurity (04/07/2021)   Hunger Vital Sign    Worried About Running Out of Food in the Last Year: Never true    Ran Out of Food in the Last Year: Never true  Transportation Needs: No Transportation Needs (04/07/2021)   PRAPARE -  Hydrologist (Medical): No    Lack of Transportation (Non-Medical): No  Physical Activity: Insufficiently Active (04/07/2021)   Exercise Vital Sign    Days of Exercise per Week: 7 days    Minutes of Exercise per Session: 20 min  Stress: No Stress Concern Present (04/07/2021)   [place]    Feeling of Stress : Not at all  Social Connections: Not on file  Intimate Partner Violence: Not on file    Family History  Problem Relation Age of Onset   Heart disease Mother 71   Emphysema Father 69   Heart disease Brother 16   Allergies  Allergen Reactions   Morphine And Related Diarrhea and Nausea And Vomiting   I? Current Outpatient Medications  Medication Sig Dispense Refill   albuterol (VENTOLIN HFA) 108 (90 Base) MCG/ACT inhaler Inhale 1-2 puffs into the lungs every 4 (four) hours as needed for wheezing or shortness of breath (cough). 1 each 2   amLODipine (NORVASC) 5 MG tablet TAKE ONE TABLET BY MOUTH ONCE DAILY 90 tablet 0   baclofen (LIORESAL) 10 MG tablet Take 0.5-1 tablets (5-10 mg total) by mouth 2 (two) times daily as needed for muscle spasms. 60 each 2   Blood Glucose Monitoring Suppl (ONE TOUCH ULTRA 2) w/Device KIT Use to check blood sugar up to 2 times daily 1 kit 0   ciprofloxacin (CIPRO) 500 MG tablet Take 1 tablet (500 mg total) by mouth 2 (two) times daily. 60 tablet 0   ferrous sulfate 325 (65 FE) MG tablet Take 325 mg by mouth daily with breakfast.     Glucosamine-Chondroitin (GLUCOSAMINE CHONDR COMPLEX PO) Take 1 capsule by mouth 2 (two) times daily.     hydrochlorothiazide (HYDRODIURIL) 12.5 MG tablet TAKE ONE TABLET BY MOUTH ONCE DAILY 90 tablet 0   ibuprofen (ADVIL) 800 MG tablet Take 1 tablet (800 mg total) by mouth 3 (three) times daily. 90 tablet 0   ketoconazole (NIZORAL) 2 % shampoo SHAMPOO INTO SCALP, LET SIT FOR 10 MINUTES THEN WASH OFF. USE 3 TIMES PER WEEK 120 mL 3    Lancets (ONETOUCH ULTRASOFT) lancets Use to check blood sugar up to twice per day 200 each 5   loratadine (CLARITIN) 10 MG tablet Take 10 mg by mouth in the morning.     losartan (COZAAR) 50 MG tablet TAKE ONE TABLET BY MOUTH ONCE DAILY 90 tablet 0   metFORMIN (GLUCOPHAGE-XR) 500 MG 24 hr tablet TAKE ONE TABLET BY MOUTH EVERYDAY AT BEDTIME 90 tablet 0   Multiple Minerals-Vitamins (CAL MAG ZINC +D3 PO)  Take 1 tablet by mouth every evening.     Multiple Vitamin (MULTIVITAMIN WITH MINERALS) TABS tablet Take 1 tablet by mouth daily.     ONETOUCH ULTRA test strip Use to check blood sugar up to twice per day 200 each 5   potassium chloride (KLOR-CON M) 10 MEQ tablet Take 1 tablet (10 mEq total) by mouth daily. 90 tablet 1   rosuvastatin (CRESTOR) 5 MG tablet TAKE ONE TABLET BY MOUTH ONCE DAILY 90 tablet 1   sodium chloride HYPERTONIC 3 % nebulizer solution Take by nebulization as needed for other. 750 mL 12   tamsulosin (FLOMAX) 0.4 MG CAPS capsule Take 1 capsule (0.4 mg total) by mouth daily. 90 capsule 3   No current facility-administered medications for this visit.     Abtx:  Anti-infectives (From admission, onward)    None       REVIEW OF SYSTEMS:  Const: negative fever, negative chills, someweight loss Eyes: negative diplopia or visual changes, negative eye pain ENT: negative coryza, negative sore throat, has hoarse voice from coughing Resp:+  cough much better Cards: No chest pain No palpitations, lower extremity edema GU: negative for frequency, dysuria and hematuria GI: Negative for abdominal pain, diarrhea, bleeding, constipation Skin: negative for rash and pruritus Heme: negative for easy bruising and gum/nose bleeding MS: Has back pain and right shoulder joint pain  Neurolo:negative for headaches, dizziness, vertigo, memory problems  Psych: anxiety, depression due to the persistent cough Endocrine: negative for thyroid, diabetes Allergy/Immunology-morphine related  products.  Objective:  VITALS:  Ht _0  (1.753 m)   Wt 166 lb (75.3 kg)   BMI 24.51 kg/m   PHYSICAL EXAM:  General: Alert, cooperative, no distress, appears stated age.  Thin built Head: Normocephalic, without obvious abnormality, atraumatic. Eyes: Conjunctivae clear, anicteric sclerae. Pupils are equal ENT Nares normal. No drainage or sinus tenderness. Lips, mucosa, and tongue normal. No Thrush Neck: Supple, symmetrical, no adenopathy, thyroid: non tender no carotid bruit and no JVD. Back: No CVA tenderness. Lungs: Bilateral air entry. Clear Heart: Regular rate and rhythm, no murmur, rub or gallop. Abdomen:did not examine Extremities: atraumatic, no cyanosis. No edema. No clubbing Skin: No rashes or lesions. Or bruising Lymph: Cervical, supraclavicular normal. Neurologic: Grossly non-focal Pertinent Labs I 12/29/2021 CMP was normal  IMAGING RESULTS: 03/16/2021 HRCT Mild diffuse bronchial wall thickening with mild central lobar and paraseptal emphysema.  Multiple tiny 2 to 3 mm pulmonary nodules scattered throughout the periphery of the lungs bilaterally.  May represent areas of mucoid impaction with thin terminal bronchioles. I have personally reviewed the films ? Impression/Recommendation Bronchiectasis polymicrobial infection.  Currently on seventh week of ciprofloxacin for Pseudomonas, Serratia and strep pneumo cultured in BAL fluid He is improved a lot  His last data 02/03/2022 but if he  chooses to stop it earlier that would be fine l  wife.   follow-up as needed  Note:  This document was prepared using Dragon voice recognition software and may include unintentional dictation errors.

## 2022-01-28 NOTE — Telephone Encounter (Signed)
-----   Message from Tsosie Billing, MD sent at 01/28/2022  1:38 PM EDT ----- Please let him know that his labs done today are good except for blood glucose of 222, thx ----- Message ----- From: Interface, Lab In [place] Sent: 01/28/2022  10:26 AM EDT To: Tsosie Billing, MD

## 2022-01-28 NOTE — Patient Instructions (Signed)
You are now nearing the 2 month mark for antibiotic for bronchiectasis- you can stop it a few days sooner- will do labs today.

## 2022-02-01 NOTE — Telephone Encounter (Signed)
Left message for nurse or provider to call back from [facility] [phone]. Will try again

## 2022-02-02 NOTE — Telephone Encounter (Signed)
Pt scheduled 02/19/2022 for injection.

## 2022-02-04 ENCOUNTER — Encounter: Payer: Self-pay | Admitting: Family Medicine

## 2022-02-05 ENCOUNTER — Encounter: Payer: Self-pay | Admitting: Family Medicine

## 2022-02-05 ENCOUNTER — Other Ambulatory Visit: Payer: Self-pay | Admitting: Family Medicine

## 2022-02-05 DIAGNOSIS — I10 Essential (primary) hypertension: Secondary | ICD-10-CM

## 2022-02-05 DIAGNOSIS — E1169 Type 2 diabetes mellitus with other specified complication: Secondary | ICD-10-CM

## 2022-02-05 NOTE — Telephone Encounter (Signed)
Requested Prescriptions  Pending Prescriptions Disp Refills  . rosuvastatin (CRESTOR) 5 MG tablet [Pharmacy Med Name: rosuvastatin 5 mg tablet] 90 tablet 1    Sig: TAKE ONE TABLET BY MOUTH ONCE DAILY     Cardiovascular:  Antilipid - Statins 2 Failed - 02/05/2022  8:47 AM      Failed - Lipid Panel in normal range within the last 12 months    Cholesterol  Date Value Ref Range Status  06/15/2021 126 <200 mg/dL Final   LDL Cholesterol (Calc)  Date Value Ref Range Status  06/15/2021 54 mg/dL (calc) Final    Comment:    Reference range: <100 . Desirable range <100 mg/dL for primary prevention;   <70 mg/dL for patients with CHD or diabetic patients  with > or = 2 CHD risk factors. Marland Kitchen LDL-C is now calculated using the Martin-Hopkins  calculation, which is a validated novel method providing  better accuracy than the Friedewald equation in the  estimation of LDL-C.  Cresenciano Genre et al. Annamaria Helling. 5732;202(54): 2061-2068  (http://education.[employer].com/faq/FAQ164)    HDL  Date Value Ref Range Status  06/15/2021 58 > OR = 40 mg/dL Final   Triglycerides  Date Value Ref Range Status  06/15/2021 59 <150 mg/dL Final         Passed - Cr in normal range and within 360 days    Creatinine  Date Value Ref Range Status  09/16/2014 0.9 0.7 - 1.3 mg/dL Final   Creat  Date Value Ref Range Status  06/15/2021 0.92 0.70 - 1.28 mg/dL Final   Creatinine, Ser  Date Value Ref Range Status  01/28/2022 0.81 0.61 - 1.24 mg/dL Final         Passed - Patient is not pregnant      Passed - Valid encounter within last 12 months    Recent Outpatient Visits          1 month ago Type 2 diabetes mellitus with other specified complication, without long-term current use of insulin ([place])   [facility] Olin Hauser, DO   4 months ago Derangement of right SI joint   [facility] [name], MD   4 months ago Derangement of right SI joint   [facility] [name], MD   5 months ago Biceps tendinitis, right   [facility] [name], MD   5 months ago Biceps tendinitis, right   [facility] [name], MD      Future Appointments            In 2 months  [facility], [place]   In 3 months Ralene Bathe, MD [place]   In 4 months [facility], [facility]           . losartan (COZAAR) 50 MG tablet [Pharmacy Med Name: losartan 50 mg tablet] 90 tablet 0    Sig: TAKE ONE TABLET BY MOUTH ONCE DAILY     Cardiovascular:  Angiotensin Receptor Blockers Failed - 02/05/2022  8:47 AM      Failed - Last BP in normal range    BP Readings from Last 1 Encounters:  01/28/22 (!) 142/82         Passed - Cr in normal range and within 180 days    Creatinine  Date Value Ref Range Status  09/16/2014 0.9 0.7 - 1.3 mg/dL Final   Creat  Date Value Ref  Range Status  06/15/2021 0.92 0.70 - 1.28 mg/dL Final   Creatinine, Ser  Date Value Ref Range Status  01/28/2022 0.81 0.61 - 1.24 mg/dL Final         Passed - K in normal range and within 180 days    Potassium  Date Value Ref Range Status  01/28/2022 4.0 3.5 - 5.1 mmol/L Final  09/16/2014 3.7 3.5 - 5.1 mEq/L Final         Passed - Patient is not pregnant      Passed - Valid encounter within last 6 months    Recent Outpatient Visits          1 month ago Type 2 diabetes mellitus with other specified complication, without long-term current use of insulin ([place])   [facility], Devonne Doughty, DO   4 months ago Derangement of right SI joint   [facility] [name], MD   4 months ago Derangement of right SI joint   [facility] [name], MD   5 months ago Biceps tendinitis, right   [facility] [name], MD   5 months ago Biceps tendinitis, right   [facility] [name], MD      Future  Appointments            In 2 months  [facility], [place]   In 3 months Ralene Bathe, MD [place]   In 4 months [place], DO [facility], [facility]           . metFORMIN (GLUCOPHAGE-XR) 500 MG 24 hr tablet [Pharmacy Med Name: metformin ER 500 mg tablet,extended release 24 hr] 90 tablet 0    Sig: TAKE ONE TABLET BY MOUTH EVERYDAY AT BEDTIME     Endocrinology:  Diabetes - Biguanides Failed - 02/05/2022  8:47 AM      Failed - B12 Level in normal range and within 720 days    Vitamin B-12  Date Value Ref Range Status  09/16/2014 1,101 (H) 211 - 911 pg/mL Final         Failed - CBC within normal limits and completed in the last 12 months    WBC  Date Value Ref Range Status  01/28/2022 7.6 4.0 - 10.5 K/uL Final   RBC  Date Value Ref Range Status  01/28/2022 4.36 4.22 - 5.81 MIL/uL Final   Hemoglobin  Date Value Ref Range Status  01/28/2022 13.5 13.0 - 17.0 g/dL Final   HGB  Date Value Ref Range Status  09/16/2014 11.6 (L) 13.0 - 17.1 g/dL Final   HCT  Date Value Ref Range Status  01/28/2022 38.2 (L) 39.0 - 52.0 % Final  09/16/2014 33.6 (L) 38.4 - 49.9 % Final   MCHC  Date Value Ref Range Status  01/28/2022 35.3 30.0 - 36.0 g/dL Final   [facility]  Date Value Ref Range Status  01/28/2022 31.0 26.0 - 34.0 pg Final   MCV  Date Value Ref Range Status  01/28/2022 87.6 80.0 - 100.0 fL Final  09/16/2014 81.4 79.3 - 98.0 fL Final   No results found for: "PLTCOUNTKUC", "LABPLAT", "POCPLA" RDW  Date Value Ref Range Status  01/28/2022 12.2 11.5 - 15.5 % Final  09/16/2014 13.9 11.0 - 14.6 % Final         Passed - Cr in normal range and within 360 days    Creatinine  Date Value Ref Range Status  09/16/2014 0.9 0.7 - 1.3 mg/dL Final   Creat  Date Value Ref Range Status  06/15/2021 0.92 0.70 - 1.28 mg/dL Final   Creatinine, Ser  Date Value Ref Range Status  01/28/2022 0.81 0.61 - 1.24 mg/dL Final         Passed -  HBA1C is between 0 and 7.9 and within 180 days    Hgb A1c MFr Bld  Date Value Ref Range Status  12/07/2021 7.1 (H) <5.7 % of total Hgb Final    Comment:    For someone without known diabetes, a hemoglobin A1c value of 6.5% or greater indicates that they may have  diabetes and this should be confirmed with a follow-up  test. . For someone with known diabetes, a value <7% indicates  that their diabetes is well controlled and a value  greater than or equal to 7% indicates suboptimal  control. A1c targets should be individualized based on  duration of diabetes, age, comorbid conditions, and  other considerations. . Currently, no consensus exists regarding use of hemoglobin A1c for diagnosis of diabetes for children. .          Passed - eGFR in normal range and within 360 days    GFR, Est African American  Date Value Ref Range Status  06/02/2020 102 > OR = 60 mL/min/1.40m Final   GFR, Est Non African American  Date Value Ref Range Status  06/02/2020 88 > OR = 60 mL/min/1.752mFinal   GFR, Estimated  Date Value Ref Range Status  01/28/2022 >60 >60 mL/min Final    Comment:    (NOTE) Calculated using the CKD-EPI Creatinine Equation (2021)    GFR  Date Value Ref Range Status  01/15/2022 81.65 >60.00 mL/min Final    Comment:    Calculated using the CKD-EPI Creatinine Equation (2021)   eGFR  Date Value Ref Range Status  06/15/2021 86 > OR = 60 mL/min/1.7374minal    Comment:    The eGFR is based on the CKD-EPI 2021 equation. To calculate  the new eGFR from a previous Creatinine or Cystatin C result, go to https://www.kidney.org/professionals/ kdoqi/gfr%5Fcalculator          Passed - Valid encounter within last 6 months    Recent Outpatient Visits          1 month ago Type 2 diabetes mellitus with other specified complication, without long-term current use of insulin (HCCAlmyra SouJoannaO   4 months ago Derangement of right  SI joint   [facility]   4 months ago Derangement of right SI joint   [facility]   5 months ago Biceps tendinitis, right   [facility]   5 months ago Biceps tendinitis, right   [facility]      Future Appointments            In 2 months  [facility]           . hydrochlorothiazide (HYDRODIURIL) 12.5 MG tablet [Pharmacy Med Name: hydrochlorothiazide 12.5 mg tablet] 90 tablet 0    Sig: TAKE ONE TABLET BY MOUTH ONCE DAILY     Cardiovascular: Diuretics - Thiazide Failed - 02/05/2022  8:47 AM      Failed - Last BP in normal range    BP Readings from Last 1 Encounters:  01/28/22 (!) 142/82         Passed - Cr in normal range and within 180 days    Creatinine  Date Value Ref Range Status  09/16/2014 0.9 0.7 - 1.3 mg/dL Final   Creat  Date Value Ref Range Status  06/15/2021 0.92 0.70 - 1.28 mg/dL Final   Creatinine, Ser  Date Value Ref Range Status  01/28/2022 0.81 0.61 - 1.24 mg/dL Final         Passed - K in normal range and within 180 days    Potassium  Date Value Ref Range Status  01/28/2022 4.0 3.5 - 5.1 mmol/L Final  09/16/2014 3.7 3.5 - 5.1 mEq/L Final         Passed - Na in normal range and within 180 days    Sodium  Date Value Ref Range Status  01/28/2022 135 135 - 145 mmol/L Final  09/16/2014 140 136 - 145 mEq/L Final         Passed - Valid encounter within last 6 months    Recent Outpatient Visits          1 month ago Type 2 diabetes mellitus with other specified complication, without long-term current use of insulin ([place])   [facility] Olin Hauser, DO   4 months ago Derangement of right SI joint   [facility] [name], MD    4 months ago Derangement of right SI joint   [facility] [name], MD   5 months ago Biceps tendinitis, right   [facility] [name], MD   5 months ago Biceps tendinitis, right   [place], Jason J, MD      Future Appointments            In 2 months  [facility], [place]   In 3 months Ralene Bathe, MD [place]   In 4 months [place], Devonne Doughty, DO [facility], [facility]           . amLODipine (NORVASC) 5 MG tablet [Pharmacy Med Name: amlodipine 5 mg tablet] 90 tablet 0    Sig: TAKE ONE TABLET BY MOUTH ONCE DAILY     Cardiovascular: Calcium Channel Blockers 2 Failed - 02/05/2022  8:47 AM      Failed - Last BP in normal range    BP Readings from Last 1 Encounters:  01/28/22 (!) 142/82         Passed - Last Heart Rate in normal range    Pulse Readings from Last 1 Encounters:  01/28/22 91         Passed - Valid encounter within last 6 months    Recent Outpatient Visits          1 month ago Type 2 diabetes mellitus with other specified complication, without long-term current use of insulin ([place])   [facility] [place], Devonne Doughty, DO   4 months ago Derangement of right SI joint   [facility] [name], MD   4 months ago Derangement of right SI joint   [facility] [name], MD   5 months ago Biceps tendinitis, right   [facility] [name], MD   5 months ago Biceps tendinitis, right  [facility] [name], MD      Future Appointments            In 2 months  [facility], [place]   In 3 months Nehemiah Massed Monia Sabal, MD [place]   In 4 months [facility], [place]

## 2022-02-08 ENCOUNTER — Telehealth: Payer: Self-pay | Admitting: Family Medicine

## 2022-02-08 LAB — MTB-RIF NAA W AFB CULT, NON-SPUTUM: Acid Fast Culture: NEGATIVE

## 2022-02-08 NOTE — Telephone Encounter (Signed)
Pt informed he does not need to stop any medications prior to his appointment with Dr Zigmund Daniel.

## 2022-02-08 NOTE — Telephone Encounter (Signed)
Copied from [place] [phone]. Topic: General - Other >> Feb 08, 2022  1:34 PM Chapman Fitch wrote: Reason for CRM: Pt sent Dr. Zigmund Daniel a mychart message and hasnt received a response / please advise

## 2022-02-10 DIAGNOSIS — J4520 Mild intermittent asthma, uncomplicated: Secondary | ICD-10-CM | POA: Diagnosis not present

## 2022-02-10 DIAGNOSIS — R053 Chronic cough: Secondary | ICD-10-CM | POA: Diagnosis not present

## 2022-02-17 ENCOUNTER — Encounter: Payer: Self-pay | Admitting: Physical Therapy

## 2022-02-17 ENCOUNTER — Encounter: Payer: Self-pay | Admitting: Student in an Organized Health Care Education/Training Program

## 2022-02-17 ENCOUNTER — Ambulatory Visit
Payer: PPO | Attending: Student in an Organized Health Care Education/Training Program | Admitting: Student in an Organized Health Care Education/Training Program

## 2022-02-17 ENCOUNTER — Ambulatory Visit
Admission: RE | Admit: 2022-02-17 | Discharge: 2022-02-17 | Disposition: A | Discharge status: Home / Self Care | Payer: PPO | Source: Ambulatory Visit | Attending: Student in an Organized Health Care Education/Training Program | Admitting: Student in an Organized Health Care Education/Training Program

## 2022-02-17 VITALS — BP 156/91 | HR 108 | Temp 98.6°F | Resp 16 | Ht 69.0 in | Wt 162.0 lb

## 2022-02-17 DIAGNOSIS — M5416 Radiculopathy, lumbar region: Secondary | ICD-10-CM | POA: Insufficient documentation

## 2022-02-17 DIAGNOSIS — Z9889 Other specified postprocedural states: Secondary | ICD-10-CM | POA: Diagnosis not present

## 2022-02-17 DIAGNOSIS — G8929 Other chronic pain: Secondary | ICD-10-CM | POA: Diagnosis not present

## 2022-02-17 DIAGNOSIS — M48061 Spinal stenosis, lumbar region without neurogenic claudication: Secondary | ICD-10-CM | POA: Insufficient documentation

## 2022-02-17 MED ORDER — DEXAMETHASONE SODIUM PHOSPHATE 10 MG/ML IJ SOLN
10.0000 mg | Freq: Once | INTRAMUSCULAR | Status: AC
  Administered 2022-02-17: 10 mg
  Filled 2022-02-17: qty 1

## 2022-02-17 MED ORDER — IOHEXOL 180 MG/ML  SOLN
10.0000 mL | Freq: Once | INTRAMUSCULAR | Status: AC
Start: 2022-02-17 — End: 2022-02-17
  Administered 2022-02-17: 10 mL via EPIDURAL
  Filled 2022-02-17: qty 20

## 2022-02-17 MED ORDER — SODIUM CHLORIDE 0.9% FLUSH
2.0000 mL | Freq: Once | INTRAVENOUS | Status: AC
  Administered 2022-02-17: 2 mL

## 2022-02-17 MED ORDER — LIDOCAINE HCL 2 % IJ SOLN
20.0000 mL | Freq: Once | INTRAMUSCULAR | Status: AC
  Administered 2022-02-17: 400 mg
  Filled 2022-02-17: qty 20

## 2022-02-17 MED ORDER — ROPIVACAINE HCL 2 MG/ML IJ SOLN
2.0000 mL | Freq: Once | INTRAMUSCULAR | Status: AC
Start: 2022-02-17 — End: 2022-02-17
  Administered 2022-02-17: 2 mL via EPIDURAL
  Filled 2022-02-17: qty 20

## 2022-02-17 NOTE — Progress Notes (Signed)
Safety precautions to be maintained throughout the outpatient stay will include: orient to surroundings, keep bed in low position, maintain call bell within reach at all times, provide assistance with transfer out of bed and ambulation.  

## 2022-02-17 NOTE — Progress Notes (Signed)
PROVIDER NOTE: Interpretation of information contained herein should be left to medically-trained personnel. Specific patient instructions are provided elsewhere under "Patient Instructions" section of medical record. This document was created in part using STT-dictation technology, any transcriptional errors that may result from this process are unintentional.  Patient: Isaac Weber Type: Established DOB: 02/14/1946 MRN: [id number] PCP: Olin Hauser, DO  Service: Procedure DOS: 02/17/2022 Setting: Ambulatory Location: Ambulatory outpatient facility Delivery: Face-to-face Provider: Gillis Santa, MD Specialty: Interventional Pain Management Specialty designation: 09 Location: Outpatient facility Ref. Prov.: Nobie Putnam *    Primary Reason for Visit: Interventional Pain Management Treatment. CC: Back Pain   Procedure:           Type: Lumbar epidural steroid injection (LESI) (interlaminar) #2    Laterality: Right   Level:  L4-5 Level.  Imaging: Fluoroscopic guidance         Anesthesia: Local anesthesia (1-2% Lidocaine) DOS: 02/17/2022  Performed by: Gillis Santa, MD  Purpose: Diagnostic/Therapeutic Indications: Lumbar radicular pain of intraspinal etiology of more than 4 weeks that has failed to respond to conservative therapy and is severe enough to impact quality of life or function. 1. Chronic radicular lumbar pain   2. Lumbar radiculopathy   3. 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 History of lumbar surgery    NAS-11 Pain score:   Pre-procedure: 9 /10   Post-procedure: 0-No pain/10      Position / Prep / Materials:  Position: Prone w/ head of the table raised (slight reverse trendelenburg) to facilitate breathing.  Prep solution: DuraPrep (Iodine Povacrylex [0.7% available iodine] and Isopropyl Alcohol, 74% w/w) Prep Area: Entire Posterior Lumbar Region from lower scapular tip down to mid buttocks area and from flank to  flank. Materials:  Tray: Epidural tray Needle(s):  Type: Epidural needle (Tuohy) Gauge (G):  17 Length: Regular (3.5-in) Qty: 1  Pre-op H&P Assessment:  Mr. Wuebker is a 76 y.o. (year old), male patient, seen today for interventional treatment. He  has a past surgical history that includes Knee arthroscopy (Right, 1991); Bunionectomy (Left, 2003); Prostate surgery (2002); Rotator cuff repair (Left, 1999); Cervical disc surgery; Leg Surgery (Left); Rotator cuff repair (Right, 1996); Green light laser turp (transurethral resection of prostate (2010); Lumbar disc surgery (2010); Bunionectomy with hammertoe reconstruction (Left, 2013); Lumbar disc surgery (2011); Back surgery; Colonoscopy with propofol (N/A, 04/30/2021); Video bronchoscopy (N/A, 11/26/2021); and Bronchial washings (11/26/2021). Mr. Bignell has a current medication list which includes the following prescription(s): albuterol, amlodipine, baclofen, one touch ultra 2, ferrous sulfate, glucosamine-chondroitin, hydrochlorothiazide, ibuprofen, ketoconazole, losartan, metformin, multiple minerals-vitamins, multivitamin with minerals, onetouch ultra, potassium chloride, rosuvastatin, sodium chloride hypertonic, tamsulosin, ciprofloxacin, onetouch ultrasoft, and loratadine. His primarily concern today is the Back Pain  Initial Vital Signs:  Pulse/HCG Rate:  (!) 111  Temp: 98.6 F (37 C) Resp: 16 BP:  (!) 147/88 SpO2: 100 %  BMI: Estimated body mass index is 23.92 kg/m as calculated from the following:   Height as of this encounter: '5\' 9"'$  (1.753 m).   Weight as of this encounter: 162 lb (73.5 kg).  Risk Assessment: Allergies: Reviewed. He is allergic to morphine and related.  Allergy Precautions: None required Coagulopathies: Reviewed. None identified.  Blood-thinner therapy: None at this time Active Infection(s): Reviewed. None identified. Mr. Dunson is afebrile  Site Confirmation: Mr. Petropoulos was asked to confirm the procedure and  laterality before marking the site Procedure checklist: Completed Consent: Before the procedure and under the influence of no sedative(s), amnesic(s),  or anxiolytics, the patient was informed of the treatment options, risks and possible complications. To fulfill our ethical and legal obligations, as recommended by the American Medical Association's Code of Ethics, I have informed the patient of my clinical impression; the nature and purpose of the treatment or procedure; the risks, benefits, and possible complications of the intervention; the alternatives, including doing nothing; the risk(s) and benefit(s) of the alternative treatment(s) or procedure(s); and the risk(s) and benefit(s) of doing nothing. The patient was provided information about the general risks and possible complications associated with the procedure. These may include, but are not limited to: failure to achieve desired goals, infection, bleeding, organ or nerve damage, allergic reactions, paralysis, and death. In addition, the patient was informed of those risks and complications associated to Spine-related procedures, such as failure to decrease pain; infection (i.e.: Meningitis, epidural or intraspinal abscess); bleeding (i.e.: epidural hematoma, subarachnoid hemorrhage, or any other type of intraspinal or peri-dural bleeding); organ or nerve damage (i.e.: Any type of peripheral nerve, nerve root, or spinal cord injury) with subsequent damage to sensory, motor, and/or autonomic systems, resulting in permanent pain, numbness, and/or weakness of one or several areas of the body; allergic reactions; (i.e.: anaphylactic reaction); and/or death. Furthermore, the patient was informed of those risks and complications associated with the medications. These include, but are not limited to: allergic reactions (i.e.: anaphylactic or anaphylactoid reaction(s)); adrenal axis suppression; blood sugar elevation that in diabetics may result in  ketoacidosis or comma; water retention that in patients with history of congestive heart failure may result in shortness of breath, pulmonary edema, and decompensation with resultant heart failure; weight gain; swelling or edema; medication-induced neural toxicity; particulate matter embolism and blood vessel occlusion with resultant organ, and/or nervous system infarction; and/or aseptic necrosis of one or more joints. Finally, the patient was informed that Medicine is not an exact science; therefore, there is also the possibility of unforeseen or unpredictable risks and/or possible complications that may result in a catastrophic outcome. The patient indicated having understood very clearly. We have given the patient no guarantees and we have made no promises. Enough time was given to the patient to ask questions, all of which were answered to the patient's satisfaction. Mr. Wich has indicated that he wanted to continue with the procedure. Attestation: I, the ordering provider, attest that I have discussed with the patient the benefits, risks, side-effects, alternatives, likelihood of achieving goals, and potential problems during recovery for the procedure that I have provided informed consent. Date  Time: 02/17/2022 10:37 AM  Pre-Procedure Preparation:  Monitoring: As per clinic protocol. Respiration, ETCO2, SpO2, BP, heart rate and rhythm monitor placed and checked for adequate function Safety Precautions: Patient was assessed for positional comfort and pressure points before starting the procedure. Time-out: I initiated and conducted the "Time-out" before starting the procedure, as per protocol. The patient was asked to participate by confirming the accuracy of the "Time Out" information. Verification of the correct person, site, and procedure were performed and confirmed by me, the nursing staff, and the patient. "Time-out" conducted as per Joint Commission's Universal Protocol (UP.01.01.01). Time:  1126  Description/Narrative of Procedure:          Target: Epidural space via interlaminar opening, initially targeting the lower laminar border of the superior vertebral body. Region: Lumbar Approach: Percutaneous paravertebral  Rationale (medical necessity): procedure needed and proper for the diagnosis and/or treatment of the patient's medical symptoms and needs. Procedural Technique Safety Precautions: Aspiration looking for blood return  was conducted prior to all injections. At no point did we inject any substances, as a needle was being advanced. No attempts were made at seeking any paresthesias. Safe injection practices and needle disposal techniques used. Medications properly checked for expiration dates. SDV (single dose vial) medications used. Description of the Procedure: Protocol guidelines were followed. The procedure needle was introduced through the skin, ipsilateral to the reported pain, and advanced to the target area. Bone was contacted and the needle walked caudad, until the lamina was cleared. The epidural space was identified using "loss-of-resistance technique" with 2-3 ml of PF-NaCl (0.9% NSS), in a 5cc LOR glass syringe.  Vitals:   02/17/22 1059 02/17/22 1113 02/17/22 1123 02/17/22 1133  BP: (!) 147/88 (!) 162/84 (!) 155/93 (!) 156/91  Pulse: (!) 111 (!) 110 (!) 109 (!) 108  Resp: '16 19 17 16  '$ Temp: 98.6 F (37 C)     SpO2: 100% 99% 97% 98%  Weight: 162 lb (73.5 kg)     Height: '5\' 9"'$  (1.753 m)       Start Time: 1126 hrs. End Time: 1132 hrs.  Imaging Guidance (Spinal):          Type of Imaging Technique: Fluoroscopy Guidance (Spinal) Indication(s): Assistance in needle guidance and placement for procedures requiring needle placement in or near specific anatomical locations not easily accessible without such assistance. Exposure Time: Please see nurses notes. Contrast: Before injecting any contrast, we confirmed that the patient did not have an allergy to iodine,  shellfish, or radiological contrast. Once satisfactory needle placement was completed at the desired level, radiological contrast was injected. Contrast injected under live fluoroscopy. No contrast complications. See chart for type and volume of contrast used. Fluoroscopic Guidance: I was personally present during the use of fluoroscopy. "Tunnel Vision Technique" used to obtain the best possible view of the target area. Parallax error corrected before commencing the procedure. "Direction-depth-direction" technique used to introduce the needle under continuous pulsed fluoroscopy. Once target was reached, antero-posterior, oblique, and lateral fluoroscopic projection used confirm needle placement in all planes. Images permanently stored in EMR. Interpretation: I personally interpreted the imaging intraoperatively. Adequate needle placement confirmed in multiple planes. Appropriate spread of contrast into desired area was observed. No evidence of afferent or efferent intravascular uptake. No intrathecal or subarachnoid spread observed. Permanent images saved into the patient's record.  Antibiotic Prophylaxis:   Anti-infectives (From admission, onward)    None      Indication(s): None identified  Post-operative Assessment:  Post-procedure Vital Signs:  Pulse/HCG Rate:  (!) 108 (nsr)  Temp: 98.6 F (37 C) Resp: 16 BP:  (!) 156/91 SpO2: 98 %  EBL: None  Complications: No immediate post-treatment complications observed by team, or reported by patient.  Note: The patient tolerated the entire procedure well. A repeat set of vitals were taken after the procedure and the patient was kept under observation following institutional policy, for this type of procedure. Post-procedural neurological assessment was performed, showing return to baseline, prior to discharge. The patient was provided with post-procedure discharge instructions, including a section on how to identify potential problems. Should  any problems arise concerning this procedure, the patient was given instructions to immediately contact us, at any time, without hesitation. In any case, we plan to contact the patient by telephone for a follow-up status report regarding this interventional procedure.  Comments:  No additional relevant information.  Plan of Care  Orders:  Orders Placed This Encounter  Procedures   [place]  1-60 MIN NO REPORT    Intraoperative interpretation by procedural physician at [place].    Standing Status:   Standing    Number of Occurrences:   1    Order Specific Question:   Reason for exam:    Answer:   Assistance in needle guidance and placement for procedures requiring needle placement in or near specific anatomical locations not easily accessible without such assistance.   Medications ordered for procedure: Meds ordered this encounter  Medications   iohexol (OMNIPAQUE) 180 MG/ML injection 10 mL    Must be Myelogram-compatible. If not available, you may substitute with a water-soluble, non-ionic, hypoallergenic, myelogram-compatible radiological contrast medium.   lidocaine (XYLOCAINE) 2 % (with pres) injection 400 mg   ropivacaine (PF) 2 mg/mL (0.2%) (NAROPIN) injection 2 mL   sodium chloride flush (NS) 0.9 % injection 2 mL   dexamethasone (DECADRON) injection 10 mg   Medications administered: We administered iohexol, lidocaine, ropivacaine (PF) 2 mg/mL (0.2%), sodium chloride flush, and dexamethasone.  See the medical record for exact dosing, route, and time of administration.  Follow-up plan:   Return in about 4 weeks (around 03/17/2022) for Post Procedure Evaluation, virtual.       R L4 +L5 TF ESI 11/25/21, right L4-L5 interlaminar ESI 02/17/22    Recent Visits Date Type Provider Dept  12/24/21 Office Visit Gillis Santa, MD [facility]  11/25/21 Procedure visit Gillis Santa, MD [facility]  Showing recent visits within past 90 days and  meeting all other requirements Today's Visits Date Type Provider Dept  02/17/22 Procedure visit Gillis Santa, MD [facility]  Showing today's visits and meeting all other requirements Future Appointments Date Type Provider Dept  03/17/22 Appointment Gillis Santa, MD [facility]  Showing future appointments within next 90 days and meeting all other requirements  Disposition: Discharge home  Discharge (Date  Time): 02/17/2022; 1135 hrs.   Primary Care Physician: Olin Hauser, DO Location: [facility] Note by: Gillis Santa, MD Date: 02/17/2022; Time: 11:53 AM  Disclaimer:  Medicine is not an exact science. The only guarantee in medicine is that nothing is guaranteed. It is important to note that the decision to proceed with this intervention was based on the information collected from the patient. The Data and conclusions were drawn from the patient's questionnaire, the interview, and the physical examination. Because the information was provided in large part by the patient, it cannot be guaranteed that it has not been purposely or unconsciously manipulated. Every effort has been made to obtain as much relevant data as possible for this evaluation. It is important to note that the conclusions that lead to this procedure are derived in large part from the available data. Always take into account that the treatment will also be dependent on availability of resources and existing treatment guidelines, considered by other Pain Management Practitioners as being common knowledge and practice, at the time of the intervention. For Medico-Legal purposes, it is also important to point out that variation in procedural techniques and pharmacological choices are the acceptable norm. The indications, contraindications, technique, and results of the above procedure should only be interpreted and judged by a Board-Certified Interventional Pain Specialist  with extensive familiarity and expertise in the same exact procedure and technique.

## 2022-02-17 NOTE — Patient Instructions (Signed)

## 2022-02-18 ENCOUNTER — Telehealth: Payer: Self-pay

## 2022-02-18 ENCOUNTER — Telehealth: Payer: Self-pay | Admitting: Family Medicine

## 2022-02-18 NOTE — Telephone Encounter (Signed)
Post procedure phone call.  Patient states he is doing good.  

## 2022-02-18 NOTE — Telephone Encounter (Signed)
Copied from [place] [id number]. Topic: General - Call Back - No Documentation >> Feb 18, 2022  3:08 PM Sabas Sous wrote: Reason for CRM: Pt called with questions for Dr. Zigmund Daniel and clinic regarding pain management  Best contact: [phone]

## 2022-02-18 NOTE — Telephone Encounter (Signed)
PC to pt, pt received a phone call from a pain clinic to make an appointment an pt informed them he sees 2 MDs already and pt was calling to just inform us.

## 2022-02-19 ENCOUNTER — Inpatient Hospital Stay: Payer: Self-pay | Admitting: Radiology

## 2022-02-19 ENCOUNTER — Ambulatory Visit: Payer: PPO | Admitting: Family Medicine

## 2022-02-19 ENCOUNTER — Encounter: Payer: Self-pay | Admitting: Family Medicine

## 2022-02-19 VITALS — BP 120/70 | HR 80 | Ht 69.0 in | Wt 162.0 lb

## 2022-02-19 DIAGNOSIS — M75101 Unspecified rotator cuff tear or rupture of right shoulder, not specified as traumatic: Secondary | ICD-10-CM | POA: Diagnosis not present

## 2022-02-19 DIAGNOSIS — M7521 Bicipital tendinitis, right shoulder: Secondary | ICD-10-CM

## 2022-02-19 NOTE — Assessment & Plan Note (Signed)
See additional assessment(s) for plan details. 

## 2022-02-19 NOTE — Assessment & Plan Note (Signed)
Right-hand-dominant patient presents with recurrence of known biceps tendinopathy and right supraspinatus related symptomatology in the setting of having to pull and tug at a tight blanket as he was dealing with a respiratory infection.  Pain localized diffusely about the shoulder without radiation, has limited his ability to raise his arm, denies any trauma or change in activity otherwise.  Examination does show limited range of motion in all directions the right shoulder with pain during resisted external rotation, internal rotation, and isolated supraspinatus testing, 4/5 strength.  Positive Neer's, Hawkins, speeds, Yergason's, equivocal O'Brien, significantly limited by overall pain, negative Spurling's.  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  He did elect to proceed with ultrasound-guided subacromial and biceps tendon sheath cortisone injections.  Post care reviewed, he is to contact us to provide a status update in several days.  Once symptoms allow, he would benefit from gentle PT for overall conditioning.

## 2022-02-19 NOTE — Patient Instructions (Signed)
You have just been given a cortisone injection to reduce pain and inflammation. After the injection you may notice immediate relief of pain as a result of the Lidocaine. It is important to rest the area of the injection for 24 to 48 hours after the injection. There is a possibility of some temporary increased discomfort and swelling for up to 72 hours until the cortisone begins to work. If you do have pain, simply rest the joint and use ice. If you can tolerate over the counter medications, you can try Tylenol for added relief per package instructions. - Contact us in 5-7 days to provide a status update - Can discuss return to physical therapy once symptoms adequately controlled

## 2022-02-19 NOTE — Progress Notes (Signed)
Primary Care / Sports Medicine Office Visit  Patient Information:  Patient ID: Isaac Weber, male DOB: 03/14/1946 Age: 76 y.o. MRN: [id number]   Isaac Weber is a pleasant 76 y.o. male presenting with the following:  Chief Complaint  Patient presents with   Biceps tendinitis, right    Vitals:   02/19/22 1103  BP: 120/70  Pulse: 80  SpO2: 98%   Vitals:   02/19/22 1103  Weight: 162 lb (73.5 kg)  Height: '5\' 9"'$  (1.753 m)   Body mass index is 23.92 kg/m.     Independent interpretation of notes and tests performed by another provider:   None  Procedures performed:   Procedure:  Injection of right subacromial space under ultrasound guidance. Ultrasound guidance utilized for in-plane approach to the right subacromial space, no clear disruption in tendon architecture noted Samsung HS60 device utilized with permanent recording / reporting. Verbal informed consent obtained and verified. Skin prepped in a sterile fashion. Ethyl chloride for topical local analgesia.  Completed without difficulty and tolerated well. Medication: triamcinolone acetonide 40 mg/mL suspension for injection 1 mL total and 2 mL lidocaine 1% without epinephrine utilized for needle placement anesthetic Advised to contact for fevers/chills, erythema, induration, drainage, or persistent bleeding.  Procedure:  Injection of right biceps tendon sheath under ultrasound guidance. Ultrasound guidance utilized for out of plane approach to the right biceps tendon sheath, hypoechoic region circumferentially about the biceps tendon in the sheath consistent with tendinitis Samsung HS60 device utilized with permanent recording / reporting. Verbal informed consent obtained and verified. Skin prepped in a sterile fashion. Ethyl chloride for topical local analgesia.  Completed without difficulty and tolerated well. Medication: triamcinolone acetonide 40 mg/mL suspension for injection 1 mL total and 2 mL  lidocaine 1% without epinephrine utilized for needle placement anesthetic Advised to contact for fevers/chills, erythema, induration, drainage, or persistent bleeding.   Pertinent History, Exam, Impression, and Recommendations:   Problem List Items Addressed This Visit       Musculoskeletal and Integument   Biceps tendinitis, right    Right-hand-dominant patient presents with recurrence of known biceps tendinopathy and right supraspinatus related symptomatology in the setting of having to pull and tug at a tight blanket as he was dealing with a respiratory infection.  Pain localized diffusely about the shoulder without radiation, has limited his ability to raise his arm, denies any trauma or change in activity otherwise.  Examination does show limited range of motion in all directions the right shoulder with pain during resisted external rotation, internal rotation, and isolated supraspinatus testing, 4/5 strength.  Positive Neer's, Hawkins, speeds, Yergason's, equivocal O'Brien, significantly limited by overall pain, negative Spurling's.  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  He did elect to proceed with ultrasound-guided subacromial and biceps tendon sheath cortisone injections.  Post care reviewed, he is to contact us to provide a status update in several days.  Once symptoms allow, he would benefit from gentle PT for overall conditioning.      Relevant Orders   [place] LIMITED JOINT SPACE STRUCTURES UP RIGHT   Supraspinatus syndrome, right - Primary    See additional assessment(s) for plan details.        Orders & Medications No orders of the defined types were placed in this encounter.  Orders Placed This Encounter  Procedures   [place] LIMITED JOINT SPACE STRUCTURES UP  RIGHT     Return if symptoms worsen or fail to improve.      [name], MD   Primary Care Sports Medicine [place]

## 2022-02-24 ENCOUNTER — Encounter: Payer: Self-pay | Admitting: Family Medicine

## 2022-02-25 ENCOUNTER — Other Ambulatory Visit: Payer: Self-pay | Admitting: Family Medicine

## 2022-02-25 ENCOUNTER — Telehealth: Payer: Self-pay

## 2022-02-25 DIAGNOSIS — M75101 Unspecified rotator cuff tear or rupture of right shoulder, not specified as traumatic: Secondary | ICD-10-CM

## 2022-02-25 DIAGNOSIS — M7521 Bicipital tendinitis, right shoulder: Secondary | ICD-10-CM

## 2022-02-25 MED ORDER — DEXAMETHASONE 1.5 MG PO TABS
ORAL_TABLET | ORAL | 0 refills | Status: AC
Start: 2022-02-25 — End: 2022-03-02

## 2022-02-25 MED ORDER — GABAPENTIN 100 MG PO CAPS: 100.0000 mg | ORAL_CAPSULE | Freq: Every day | ORAL | 0 refills | Status: DC

## 2022-02-25 NOTE — Telephone Encounter (Signed)
error 

## 2022-02-25 NOTE — Telephone Encounter (Signed)
Appointment Scheduled

## 2022-02-25 NOTE — Telephone Encounter (Signed)
Please review.  KP

## 2022-02-25 NOTE — Telephone Encounter (Signed)
Called pt left VM to call back.  KP 

## 2022-02-26 ENCOUNTER — Encounter: Payer: Self-pay | Admitting: Family Medicine

## 2022-02-27 ENCOUNTER — Other Ambulatory Visit: Payer: Self-pay | Admitting: Family Medicine

## 2022-03-01 ENCOUNTER — Other Ambulatory Visit: Payer: Self-pay

## 2022-03-01 ENCOUNTER — Other Ambulatory Visit: Payer: Self-pay | Admitting: Dermatology

## 2022-03-01 DIAGNOSIS — M25511 Pain in right shoulder: Secondary | ICD-10-CM

## 2022-03-01 NOTE — Telephone Encounter (Signed)
Requested medication (s) are due for refill today: {yes  Requested medication (s) are on the active medication list: yes  Last refill:  02/25/22  Future visit scheduled yes  Notes to clinic:  Unable to refill per protocol, last refill by provider 02/25/22. 5 tablets were dispensed. Pt may need refill, routing for approval.     Requested Prescriptions  Pending Prescriptions Disp Refills   gabapentin (NEURONTIN) 100 MG capsule [Pharmacy Med Name: GABAPENTIN '100MG'$  CAPSULES] 5 capsule 0    Sig: TAKE 1 CAPSULE(100 MG) BY MOUTH AT BEDTIME     Neurology: Anticonvulsants - gabapentin Passed - 02/27/2022  3:10 AM      Passed - Cr in normal range and within 360 days    Creatinine  Date Value Ref Range Status  09/16/2014 0.9 0.7 - 1.3 mg/dL Final   Creat  Date Value Ref Range Status  06/15/2021 0.92 0.70 - 1.28 mg/dL Final   Creatinine, Ser  Date Value Ref Range Status  01/28/2022 0.81 0.61 - 1.24 mg/dL Final         Passed - Completed PHQ-2 or PHQ-9 in the last 360 days      Passed - Valid encounter within last 12 months    Recent Outpatient Visits           1 week ago Supraspinatus syndrome, right   [facility] [name], MD   2 months ago Type 2 diabetes mellitus with other specified complication, without long-term current use of insulin ([place])   [place], DO   4 months ago Derangement of right SI joint   [facility] [name], MD   5 months ago Derangement of right SI joint   [facility] [name], MD   5 months ago Biceps tendinitis, right   [facility] [name], MD       Future Appointments             Tomorrow [name], MD [facility], [place]   In 1 month  [facility], [place]   In 2 months Ralene Bathe, MD [place]   In 3 months [facility], [facility]

## 2022-03-02 ENCOUNTER — Inpatient Hospital Stay (INDEPENDENT_AMBULATORY_CARE_PROVIDER_SITE_OTHER): Payer: PPO | Admitting: Radiology

## 2022-03-02 ENCOUNTER — Encounter: Payer: Self-pay | Admitting: Family Medicine

## 2022-03-02 ENCOUNTER — Ambulatory Visit
Admission: RE | Admit: 2022-03-02 | Discharge: 2022-03-02 | Disposition: A | Discharge status: Home / Self Care | Payer: PPO | Source: Ambulatory Visit | Attending: Family Medicine | Admitting: Family Medicine

## 2022-03-02 ENCOUNTER — Ambulatory Visit
Admission: RE | Admit: 2022-03-02 | Discharge: 2022-03-02 | Disposition: A | Discharge status: Home / Self Care | Payer: PPO | Attending: Family Medicine | Admitting: Family Medicine

## 2022-03-02 ENCOUNTER — Ambulatory Visit (INDEPENDENT_AMBULATORY_CARE_PROVIDER_SITE_OTHER): Payer: PPO | Admitting: Family Medicine

## 2022-03-02 VITALS — BP 110/60 | HR 88 | Ht 69.0 in | Wt 165.6 lb

## 2022-03-02 DIAGNOSIS — M7521 Bicipital tendinitis, right shoulder: Secondary | ICD-10-CM | POA: Insufficient documentation

## 2022-03-02 DIAGNOSIS — M75101 Unspecified rotator cuff tear or rupture of right shoulder, not specified as traumatic: Secondary | ICD-10-CM | POA: Insufficient documentation

## 2022-03-02 DIAGNOSIS — M25511 Pain in right shoulder: Secondary | ICD-10-CM | POA: Diagnosis not present

## 2022-03-02 DIAGNOSIS — M4722 Other spondylosis with radiculopathy, cervical region: Secondary | ICD-10-CM | POA: Diagnosis not present

## 2022-03-02 DIAGNOSIS — M4312 Spondylolisthesis, cervical region: Secondary | ICD-10-CM | POA: Diagnosis not present

## 2022-03-02 DIAGNOSIS — M19011 Primary osteoarthritis, right shoulder: Secondary | ICD-10-CM

## 2022-03-02 MED ORDER — GABAPENTIN 300 MG PO CAPS: 300.0000 mg | ORAL_CAPSULE | Freq: Every day | ORAL | 2 refills | Status: DC

## 2022-03-02 MED ORDER — TRIAMCINOLONE ACETONIDE 40 MG/ML IJ SUSP
40.0000 mg | Freq: Once | INTRAMUSCULAR | Status: AC
  Administered 2022-03-02: 40 mg via INTRAMUSCULAR

## 2022-03-02 NOTE — Progress Notes (Signed)
Primary Care / Sports Medicine Office Visit  Patient Information:  Patient ID: Isaac Weber, male DOB: 09-05-1945 Age: 77 y.o. MRN: [id number]   Isaac Weber is a pleasant 76 y.o. male presenting with the following:  Chief Complaint  Patient presents with   Neck Pain   Shoulder Pain    Right, since February.     Vitals:   03/02/22 1334  BP: 110/60  Pulse: 88  SpO2: 98%   Vitals:   03/02/22 1334  Weight: 165 lb 9.6 oz (75.1 kg)  Height: '5\' 9"'$  (1.753 m)   Body mass index is 24.45 kg/m.     Independent interpretation of notes and tests performed by another provider:   Independent interpretation of right cervical spine x-ray reveals apparent cervical fusion at the inferior cervical spine, severe right sided foraminal stenosis when compared to contralateral, no acute osseous processes noted.  Independent interpretation of right shoulder x-rays reveals mild glenohumeral osteoarthritis with osteophyte formation superiorly and inferiorly, surgical anchors visualized without evidence of hardware failure.  Procedures performed:   Procedure:  Injection of right glenohumeral joint under ultrasound guidance. Ultrasound guidance utilized for out of plane approach to the right glenohumeral joint posteriorly, dynamic joint motion noted, no effusion noted Samsung HS60 device utilized with permanent recording / reporting. Verbal informed consent obtained and verified. Skin prepped in a sterile fashion. Ethyl chloride for topical local analgesia.  Completed without difficulty and tolerated well. Medication: triamcinolone acetonide 40 mg/mL suspension for injection 1 mL total and 2 mL lidocaine 1% without epinephrine utilized for needle placement anesthetic Advised to contact for fevers/chills, erythema, induration, drainage, or persistent bleeding.   Pertinent History, Exam, Impression, and Recommendations:   Problem List Items Addressed This Visit       Nervous and  Auditory   Cervical spondylosis with radiculopathy - Primary    Given the recalcitrant nature of patient's stated shoulder symptoms, cervical spine and shoulder x-rays were obtained thing severe right-sided foraminal narrowing at the cervical spine which can account for symptoms being radicular in nature stemming from the cervical spine.  He does have comorbid right glenohumeral osteoarthritis, see additional assessment(s) for plan details.  Plan on titrating gabapentin to 300 mg nightly and I have advised to return to Dr. Holley Raring for pain and spine evaluation and management options.      Relevant Medications   gabapentin (NEURONTIN) 300 MG capsule   Other Relevant Orders   Ambulatory referral to Pain Clinic     Musculoskeletal and Integument   Osteoarthritis of glenohumeral joint, right    Noted on recent x-rays in the setting of somewhat recalcitrant shoulder pain, that being said given his comorbid severe cervical spondylosis and right-sided foraminal narrowing, a diagnostic and therapeutic corticosteroid injection was administered the right glenohumeral joint under ultrasound guidance to further differentiate pain generator -shoulder versus cervical spine.  Post care reviewed and patient was advised to provide a status update when able to do so via MyChart.      Relevant Orders   [place] LIMITED JOINT SPACE STRUCTURES UP RIGHT     Orders & Medications Meds ordered this encounter  Medications   triamcinolone acetonide (KENALOG-40) injection 40 mg   gabapentin (NEURONTIN) 300 MG capsule    Sig: Take 1 capsule (300 mg total) by mouth at bedtime.    Dispense:  30 capsule    Refill:  2   Orders Placed This Encounter  Procedures   [place] LIMITED JOINT SPACE STRUCTURES UP  RIGHT   Ambulatory referral to Pain Clinic     No follow-ups on file.     [name], MD   Primary Care Sports Medicine [place]

## 2022-03-02 NOTE — Patient Instructions (Signed)
You have just been given a cortisone injection to reduce pain and inflammation. After the injection you may notice immediate relief of pain as a result of the Lidocaine. It is important to rest the area of the injection for 24 to 48 hours after the injection. There is a possibility of some temporary increased discomfort and swelling for up to 72 hours until the cortisone begins to work. If you do have pain, simply rest the joint and use ice. If you can tolerate over the counter medications, you can try Tylenol for added relief per package instructions. - Increase gabapentin to 300 mg nightly - Touch base with pain and spine group, Dr. Elwyn Lade office, for evaluation of neck - Follow-up as needed

## 2022-03-02 NOTE — Assessment & Plan Note (Signed)
Noted on recent x-rays in the setting of somewhat recalcitrant shoulder pain, that being said given his comorbid severe cervical spondylosis and right-sided foraminal narrowing, a diagnostic and therapeutic corticosteroid injection was administered the right glenohumeral joint under ultrasound guidance to further differentiate pain generator -shoulder versus cervical spine.  Post care reviewed and patient was advised to provide a status update when able to do so via MyChart.

## 2022-03-02 NOTE — Assessment & Plan Note (Signed)
Given the recalcitrant nature of patient's stated shoulder symptoms, cervical spine and shoulder x-rays were obtained thing severe right-sided foraminal narrowing at the cervical spine which can account for symptoms being radicular in nature stemming from the cervical spine.  He does have comorbid right glenohumeral osteoarthritis, see additional assessment(s) for plan details.  Plan on titrating gabapentin to 300 mg nightly and I have advised to return to Dr. Holley Raring for pain and spine evaluation and management options.

## 2022-03-03 ENCOUNTER — Encounter: Payer: Self-pay | Admitting: Family Medicine

## 2022-03-03 NOTE — Telephone Encounter (Signed)
FYI

## 2022-03-04 ENCOUNTER — Encounter: Payer: Self-pay | Admitting: Physical Therapy

## 2022-03-09 ENCOUNTER — Ambulatory Visit
Payer: PPO | Attending: Student in an Organized Health Care Education/Training Program | Admitting: Student in an Organized Health Care Education/Training Program

## 2022-03-09 ENCOUNTER — Encounter: Payer: Self-pay | Admitting: Student in an Organized Health Care Education/Training Program

## 2022-03-09 VITALS — BP 142/87 | HR 89 | Temp 97.3°F | Resp 16 | Ht 69.0 in | Wt 157.0 lb

## 2022-03-09 DIAGNOSIS — Z981 Arthrodesis status: Secondary | ICD-10-CM | POA: Diagnosis not present

## 2022-03-09 DIAGNOSIS — M542 Cervicalgia: Secondary | ICD-10-CM

## 2022-03-09 DIAGNOSIS — M4722 Other spondylosis with radiculopathy, cervical region: Secondary | ICD-10-CM

## 2022-03-09 DIAGNOSIS — M47812 Spondylosis without myelopathy or radiculopathy, cervical region: Secondary | ICD-10-CM | POA: Diagnosis not present

## 2022-03-09 DIAGNOSIS — M48061 Spinal stenosis, lumbar region without neurogenic claudication: Secondary | ICD-10-CM | POA: Diagnosis not present

## 2022-03-09 DIAGNOSIS — G8929 Other chronic pain: Secondary | ICD-10-CM

## 2022-03-09 DIAGNOSIS — M5416 Radiculopathy, lumbar region: Secondary | ICD-10-CM

## 2022-03-09 NOTE — Progress Notes (Signed)
Safety precautions to be maintained throughout the outpatient stay will include: orient to surroundings, keep bed in low position, maintain call bell within reach at all times, provide assistance with transfer out of bed and ambulation.  

## 2022-03-09 NOTE — Progress Notes (Signed)
PROVIDER NOTE: Information contained herein reflects review and annotations entered in association with encounter. Interpretation of such information and data should be left to medically-trained personnel. Information provided to patient can be located elsewhere in the medical record under "Patient Instructions". Document created using STT-dictation technology, any transcriptional errors that may result from process are unintentional.    Patient: Isaac Weber  Service Category: E/M  Provider: Gillis Santa, MD  DOB: 03-Sep-1945  DOS: 03/09/2022  Referring Provider: Nobie Putnam *  MRN: [id number]  Specialty: Interventional Pain Management  PCP: Olin Hauser, DO  Type: Established Patient  Setting: Ambulatory outpatient    Location: Office  Delivery: Face-to-face     HPI  [name], a 76 y.o. year old male, is here today because of his Chronic radicular lumbar pain [M54.16, G89.29]. [name] primary complain today is Shoulder Pain (right), Neck Pain, and Back Pain (lower) Last encounter: My last encounter with him was on 02/17/2022. Pertinent problems: 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 Pain Assessment: Severity of Chronic pain is reported as a 8 /10. Location: Shoulder Right/denies. Onset: More than a month ago. Quality: Burning. Timing: Constant. Modifying factor(s): nothing. Vitals:  height is '5\' 9"'  (1.753 m) and weight is 157 lb (71.2 kg). His temporal temperature is 97.3 F (36.3 C) (abnormal). His blood pressure is 142/87 (abnormal) and his pulse is 89. His respiration is 16 and oxygen saturation is 99%.   Reason for encounter: post-procedure evaluation and assessment.  As well as increased neck pain with radiation into right shoulder.  History of cervical spine  surgery.  Patient states that he got moderate pain relief rated as approximately 75% after his lumbar epidural steroid injection done on 02/17/2022.  He states that his radiating right leg pain has decreased but over the last couple of days he has started to notice an increase of it as well as intermittent left buttock pain.  We discussed repeating L4-L5 ESI #2 and adding additional volume.  In regards to his cervical spine pain,, he does have a history of cervical spinal fusion done in the 1990s.  He has difficulty with right overhead reach.  He has seen Dr. Zigmund Daniel with sports medicine for a right glenohumeral joint injection under ultrasound guidance which was not helpful.  He is also had a biceps trigger point injection which was not helpful.  He states that he finds it very difficult to perform overhead reach and back hand reach.  I would like to follow-up his symptoms with a cervical MRI to further evaluate any cervical neuroforaminal or central canal stenosis that could be contributing to his right-sided neck and radiating shoulder pain.    ROS  Constitutional: Denies any fever or chills Gastrointestinal: No reported hemesis, hematochezia, vomiting, or acute GI distress Musculoskeletal:  Low back pain with radiation to bilateral legs, right greater than left; cervical spine pain with radiation into her right shoulder Neurological: No reported episodes of acute onset apraxia, aphasia, dysarthria, agnosia, amnesia, paralysis, loss of coordination, or loss of consciousness  Medication Review  Glucosamine-Chondroitin, Multiple Minerals-Vitamins, ONE TOUCH ULTRA 2, albuterol, amLODipine, baclofen, ferrous sulfate, glucose blood, hydrochlorothiazide, ibuprofen, ketoconazole, losartan, metFORMIN, multivitamin with minerals, potassium chloride, rosuvastatin, sodium chloride HYPERTONIC, and tamsulosin  History Review  Allergy: [name] is allergic to morphine and related. Drug: [name]  reports no history of drug use. Alcohol:  reports current alcohol use of about 2.0 standard drinks of alcohol per week. Tobacco:  reports that he quit smoking about 50 years ago. His smoking use included cigarettes. He has a 78.00 pack-year smoking history. He has been exposed to tobacco smoke. He has never used smokeless tobacco. Social: [name]  reports that he quit smoking about 50 years ago. His smoking use included cigarettes. He has a 78.00 pack-year smoking history. He has been exposed to tobacco smoke. He has never used smokeless tobacco. He reports current alcohol use of about 2.0 standard drinks of alcohol per week. He reports that he does not use drugs. 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 Surgical: [name]  has a past surgical history that includes Knee arthroscopy (Right, 1991); Bunionectomy (Left, 2003); Prostate surgery (2002); Rotator cuff repair (Left, 1999); Cervical disc surgery; Leg Surgery (Left); Rotator cuff repair (Right, 1996); Green light laser turp (transurethral resection of prostate (2010); Lumbar disc surgery (2010); Bunionectomy with hammertoe reconstruction (Left, 2013); Lumbar disc surgery (2011); Back surgery; Colonoscopy with propofol (N/A, 04/30/2021); Video bronchoscopy (N/A, 11/26/2021); and Bronchial washings (11/26/2021). Family: family history includes Emphysema (age of onset: 41) in his father; Heart disease (age of onset: 78) in his mother;  Heart disease (age of onset: 52) in his brother.  Laboratory Chemistry Profile   Renal Lab Results  Component Value Date   BUN 15 01/28/2022   CREATININE 0.81 96/22/2979   BCR NOT APPLICABLE 89/21/1941   GFR 81.65 01/15/2022   GFRAA 102 06/02/2020   GFRNONAA >60 01/28/2022    Hepatic Lab Results  Component Value Date   AST 24 01/28/2022   ALT 23 01/28/2022   ALBUMIN 3.9 01/28/2022   ALKPHOS 66 01/28/2022    Electrolytes Lab Results  Component Value Date   NA 135 01/28/2022   K 4.0 01/28/2022   CL 103 01/28/2022   CALCIUM 9.6 01/28/2022    Bone No results found for: "VD25OH", "VD125OH2TOT", "DE0814GY1", "EH6314HF0", "25OHVITD1", "25OHVITD2", "25OHVITD3", "TESTOFREE", "TESTOSTERONE"  Inflammation (CRP: Acute Phase) (ESR: Chronic Phase) Lab Results  Component Value Date   CRP 0.7 12/03/2021         Note: Above Lab results reviewed.  Recent Imaging Review  DG Cervical Spine Complete CLINICAL DATA:  Chronic right shoulder pain. Concern for referred pain from cervical spine. History remote cervical spine surgery in the 1990s.  EXAM: CERVICAL SPINE - COMPLETE 4+ VIEW  COMPARISON:  None Available.  FINDINGS: Visualization of C1 through C7 on lateral view. There is 3 mm grade 1 anterolisthesis of C4 on C5 and 4 mm grade 1 anterolisthesis of C3 on C4.  Vertebral body heights are maintained. Moderate atlantodens degenerative changes. The dens is intact.  There is osseous fusion of the C5-6 and C6-7 levels.  Bilateral C3-4 and right C4-5 neuroforaminal narrowing on oblique views. The lateral masses of C1 are symmetrically aligned with the dens on the open-mouth odontoid view.  No prevertebral soft tissue swelling.  The lung apices are clear.  IMPRESSION: 1. Postsurgical changes of remote C5 through C7 osseous fusion. 2. Grade 1 anterolisthesis  of C4 on C5 greater than C3 on C4. 3. 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  Electronically  Signed   By: Yvonne Kendall M.D.   On: 03/02/2022 17:24 [place] LIMITED JOINT SPACE STRUCTURES UP RIGHT Procedure:  Injection of right glenohumeral joint under ultrasound  guidance. Ultrasound guidance utilized for out of plane approach to the right  glenohumeral joint posteriorly, dynamic joint motion noted, no effusion  noted Samsung HS60 device utilized with permanent recording / reporting. Verbal informed consent obtained and verified. Skin prepped in a sterile fashion. Ethyl chloride for topical local analgesia.  Completed without difficulty and tolerated well. Medication: triamcinolone acetonide 40 mg/mL suspension for injection 1 mL  total and 2 mL lidocaine 1% without epinephrine utilized for needle  placement anesthetic Advised to contact for fevers/chills, erythema, induration, drainage, or  persistent bleeding. DG Shoulder Right Addendum: ADDENDUM REPORT: 03/02/2022 17:23   ADDENDUM:  Additional review, the right clavicle is present, and there are  likely postsurgical changes of partial distal clavicle excision.   IMPRESSION:  1. Mild glenohumeral osteoarthritis.  2. Postsurgical changes of partial distal clavicle excision.   Electronically Signed    By: Yvonne Kendall M.D.    On: 03/02/2022 17:23 Narrative: CLINICAL DATA:  Right shoulder pain since 08/2021.  EXAM: RIGHT SHOULDER - 2+ VIEW  COMPARISON:  None Available.  FINDINGS: Mild glenohumeral joint space narrowing. Mild inferior glenoid degenerative osteophytosis.  Three screw anchors overlie the superolateral humeral head from prior surgery.  The clavicle is not visualized. This appears to be new from 03/16/2021 chest CT and is presumably postsurgical. Two small ossicles are seen overlying the expected lateral aspect of the clavicle. The visualized portion of the right lung is unremarkable.  IMPRESSION: 1. Mild glenohumeral osteoarthritis. 2. Apparent postsurgical changes of resection of nearly the  entire right clavicle.  Electronically Signed: By: Yvonne Kendall M.D. On: 03/02/2022 17:17 Note: Reviewed        Physical Exam  General appearance: Well nourished, well developed, and well hydrated. In no apparent acute distress Mental status: Alert, oriented x 3 (person, place, & time)       Respiratory: No evidence of acute respiratory distress Eyes: PERLA Vitals: BP (!) 142/87   Pulse 89   Temp (!) 97.3 F (36.3 C) (Temporal)   Resp 16   Ht '5\' 9"'  (1.753 m)   Wt 157 lb (71.2 kg)   SpO2 99%   BMI 23.18 kg/m  BMI: Estimated body mass index is 23.18 kg/m as calculated from the following:   Height as of this encounter: '5\' 9"'  (1.753 m).   Weight as of this encounter: 157 lb (71.2 kg). Ideal: Ideal body weight: 70.7 kg (155 lb 13.8 oz) Adjusted ideal body weight: 70.9 kg (156 lb 5.1 oz)  Cervical Spine Area Exam  Skin & Axial Inspection: Well healed scar from previous spine surgery detected Alignment: Symmetrical Functional ROM: Pain restricted ROM, bilaterally right greater than left Stability: No instability detected Muscle Tone/Strength: Functionally intact. No obvious neuro-muscular anomalies detected. Sensory (Neurological): Dermatomal pain pattern right C4-C5 Palpation: No palpable anomalies             Upper Extremity (UE) Exam    Side: Right upper extremity  Side: Left upper extremity  Skin & Extremity Inspection: Skin color, temperature, and hair growth are WNL. No peripheral edema or cyanosis. No masses, redness, swelling, asymmetry, or associated skin lesions. No contractures.  Skin & Extremity Inspection: Skin color, temperature, and hair  growth are WNL. No peripheral edema or cyanosis. No masses, redness, swelling, asymmetry, or associated skin lesions. No contractures.  Functional ROM: Pain restricted ROM for shoulder and elbow  Functional ROM: Unrestricted ROM          Muscle Tone/Strength: Functionally intact. No obvious neuro-muscular anomalies detected.  Muscle  Tone/Strength: Functionally intact. No obvious neuro-muscular anomalies detected.  Sensory (Neurological): Dermatomal pain pattern          Sensory (Neurological): Unimpaired          Palpation: No palpable anomalies              Palpation: No palpable anomalies              Provocative Test(s):  Phalen's test: deferred Tinel's test: deferred Apley's scratch test (touch opposite shoulder):  Action 1 (Across chest): Decreased ROM Action 2 (Overhead): Decreased ROM Action 3 (LB reach): Decreased ROM   Provocative Test(s):  Phalen's test: deferred Tinel's test: deferred Apley's scratch test (touch opposite shoulder):  Action 1 (Across chest): deferred Action 2 (Overhead): deferred Action 3 (LB reach): deferred    Lumbar Spine Area Exam   Lumbar Spine Area Exam  Skin & Axial Inspection: Well healed scar from previous spine surgery detected Alignment: Symmetrical Functional ROM: Pain restricted ROM affecting primarily the right Stability: No instability detected Muscle Tone/Strength: Functionally intact. No obvious neuro-muscular anomalies detected. Sensory (Neurological): Dermatomal pain pattern and articular Palpation: No palpable anomalies       Provocative Tests: Hyperextension/rotation test: (+) bilaterally for facet joint pain. R>L Lumbar quadrant test (Kemp's test): (+) on the right for foraminal stenosis Lateral bending test: (+) ipsilateral radicular pain, on the right. Positive for right-sided foraminal stenosis.     Gait & Posture Assessment  Ambulation: Unassisted Gait: Antalgic gait (limping) Posture: WNL  Lower Extremity Exam      Side: Right lower extremity   Side: Left lower extremity  Stability: No instability observed           Stability: No instability observed          Skin & Extremity Inspection: Skin color, temperature, and hair growth are WNL. No peripheral edema or cyanosis. No masses, redness, swelling, asymmetry, or associated skin lesions. No  contractures.   Skin & Extremity Inspection: Skin color, temperature, and hair growth are WNL. No peripheral edema or cyanosis. No masses, redness, swelling, asymmetry, or associated skin lesions. No contractures.  Functional ROM: Unrestricted ROM                   Functional ROM: Unrestricted ROM                  Muscle Tone/Strength: Movement possible against some resistance (4/5) positive straight leg raise test   Muscle Tone/Strength: Functionally intact. No obvious neuro-muscular anomalies detected.  Sensory (Neurological): Neurogenic pain pattern         Sensory (Neurological): Unimpaired        DTR: Patellar: deferred today Achilles: deferred today Plantar: deferred today   DTR: Patellar: deferred today Achilles: deferred today Plantar: deferred today  Palpation: No palpable anomalies   Palpation: No palpable anomalies     Assessment   Diagnosis Status  1. Chronic radicular lumbar pain   2. Lumbar radiculopathy   3. 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 Cervical spondylosis with radiculopathy   5. Cervical facet joint syndrome   6. S/P cervical spinal fusion   7. 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 Chronic radicular lumbar pain - Lumbar Epidural Injection; Future  2. Lumbar radiculopathy - Lumbar Epidural Injection; Future  3. 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 Cervical spondylosis with radiculopathy - MR CERVICAL SPINE W WO CONTRAST; Future  5. Cervical facet joint syndrome - MR CERVICAL SPINE W WO CONTRAST; Future  6. S/P cervical spinal fusion - MR CERVICAL SPINE W WO CONTRAST; Future  7. Cervicalgia - MR  CERVICAL SPINE W WO CONTRAST; Future   Lumbar radicular pain: Repeat lumbar ESI  Right cervical radicular pain in the context of cervical spinal fusion: Cervical spine x-rays show C5-C7 fusion with grade 1 anterolisthesis of C4 on C5 and bilateral C3-C4 and right C4-C5 neuroforaminal stenosis.  I am concerned that his osseous neuroforaminal stenosis on the right is fairly severe and would like a cervical MRI to follow-up and subsequently discuss treatment plan which may include a right cervical epidural steroid injection.  I will review patient's cervical MRI with him after he has completed it.  Orders:  Orders Placed This Encounter  Procedures   Lumbar Epidural Injection    Standing Status:   Future    Standing Expiration Date:   06/09/2022    Scheduling Instructions:     Procedure: Interlaminar Lumbar Epidural Steroid injection (LESI)            Laterality: Midline     L4/5    Order Specific Question:   Where will this procedure be performed?    Answer:   [facility]   MR CERVICAL SPINE W WO CONTRAST    Standing Status:   Future    Standing Expiration Date:   03/10/2023    Order Specific Question:   If indicated for the ordered procedure, I authorize the administration of contrast media per Radiology protocol    Answer:   Yes    Order Specific Question:   What is the patient's sedation requirement?    Answer:   No Sedation    Order Specific Question:   Does the patient have a pacemaker or implanted devices?    Answer:   No    Order Specific Question:   Preferred imaging location?    Answer:   [facility] (table limit - 550lbs)   Follow-up plan:   Return in about 2 weeks (around 03/23/2022) for Right L4/5 ESI, in clinic NS.     R L4 +L5 TF ESI 11/25/21, right L4-L5 interlaminar ESI 02/17/22     Recent Visits Date Type Provider Dept  02/17/22 Procedure visit Gillis Santa, MD [facility]  12/24/21 Office Visit Gillis Santa, MD [facility]   Showing recent visits within past 90 days and meeting all other requirements Today's Visits Date Type Provider Dept  03/09/22 Office Visit Gillis Santa, MD [facility]  Showing today's visits and meeting all other requirements Future Appointments Date Type Provider Dept  03/24/22 Appointment Gillis Santa, MD [facility]  Showing future appointments within next 90 days and meeting all other requirements  I discussed the assessment and treatment plan with the patient. The patient was provided an opportunity to ask questions and all were answered. The patient agreed with  the plan and demonstrated an understanding of the instructions.  Patient advised to call back or seek an in-person evaluation if the symptoms or condition worsens.  Duration of encounter: 68mnutes.  Total time on encounter, as per AMA guidelines included both the face-to-face and non-face-to-face time personally spent by the physician and/or other qualified health care professional(s) on the day of the encounter (includes time in activities that require the physician or other qualified health care professional and does not include time in activities normally performed by clinical staff). Physician's time may include the following activities when performed: preparing to see the patient (eg, review of tests, pre-charting review of records) obtaining and/or reviewing separately obtained history performing a medically appropriate examination and/or evaluation counseling and educating the patient/family/caregiver ordering medications, tests, or procedures referring and communicating with other health care professionals (when not separately reported) documenting clinical information in the electronic or other health record independently interpreting results (not separately reported) and communicating results to the patient/ family/caregiver care coordination (not separately reported)  Note by: BGillis Santa  MD Date: 03/09/2022; Time: 10:42 AM

## 2022-03-09 NOTE — Patient Instructions (Addendum)
Cancel VV Schedule for repeat L-ESI L4/5 in 1-2 weeks Obtain C-MRi  Epidural Steroid Injection  An epidural steroid injection is a shot of steroid medicine, also called cortisone, and a numbing medicine that is given into the epidural space. This space is between the spinal cord and the bones of the back. This shot helps relieve pain caused by an irritated or swollen nerve root. The amount of pain relief you get from the injection depends on what is causing the nerve to be swollen and irritated, and how long your pain lasts. You may have a period of slightly more pain after your injection, before the steroid medicine takes effect. This medicine usually starts working within 1-3 days. In some cases, you might need 7-10 days to feel the full effect. Tell your health care provider about: Any allergies you have. All medicines you are taking, including vitamins, herbs, eye drops, creams, and over-the-counter medicines. Any problems you or family members have had with anesthetic medicines. Any bleeding problems you have. Any surgeries you have had. Any medical conditions you have. Whether you are pregnant or may be pregnant. What are the risks? Your health care provider will talk with you about risks. These may include: Headache. Bleeding. Infection. Allergic reaction to medicines or dyes. Nerve damage. Not being able to move (paralysis). This is rare. What happens before the procedure? Medicines You may be given medicines to lower anxiety. Ask your health care provider about: Changing or stopping your regular medicines. These include any diabetes medicines or blood thinners you take. Taking medicines such as aspirin and ibuprofen. These medicines can thin your blood. Do not take them unless your health care provider tells you to. Taking over-the-counter medicines, vitamins, herbs, and supplements. General instructions Follow instructions from your health care provider about what you may  eat and drink. Ask your health care provider what steps will be taken to help prevent infection. If you will be going home right after the procedure, plan to have a responsible adult: Take you home from the hospital or clinic. You will not be allowed to drive. Care for you for the time you are told. What happens during the procedure?  An IV will be inserted into one of your veins. You may be given a sedative. This helps you relax. You will be asked to lie on your side or sit. The injection site will be cleaned. An X-ray machine will be used to guide the needle as close as possible to the nerve causing pain. A needle will be put through your skin into the epidural space. This may cause you some discomfort. Contrast dye may be injected at the site to make sure that the steroid medicine will be sent to the exact place it needs to go. The steroid medicine and a numbing medicine (local anesthesia) will be injected into the epidural space for pain relief. The needle and IV will be removed. A bandage (dressing) will be put over the injection site. The procedure may vary among health care providers and hospitals. What happens after the procedure? Your blood pressure, heart rate, breathing rate, and blood oxygen level will be monitored until you leave the hospital or clinic. Your arm or leg may feel weak or numb for a few hours. Summary An epidural steroid injection is a shot of steroid medicine and a numbing medicine that is given into the epidural space. The shot helps relieve pain caused by an irritated or swollen nerve root. The steroid medicine usually starts working  within 1-3 days. In some cases, you might need 7-10 days to feel the full effect. This information is not intended to replace advice given to you by your health care provider. Make sure you discuss any questions you have with your health care provider. Document Revised: 11/03/2021 Document Reviewed: 11/03/2021 Elsevier Patient  Education  [place].

## 2022-03-10 ENCOUNTER — Ambulatory Visit (INDEPENDENT_AMBULATORY_CARE_PROVIDER_SITE_OTHER): Payer: PPO

## 2022-03-10 ENCOUNTER — Encounter: Payer: Self-pay | Admitting: Primary Care

## 2022-03-10 ENCOUNTER — Ambulatory Visit: Payer: PPO | Admitting: Primary Care

## 2022-03-10 VITALS — BP 122/78 | HR 91 | Temp 97.5°F | Ht 69.0 in | Wt 158.6 lb

## 2022-03-10 DIAGNOSIS — J479 Bronchiectasis, uncomplicated: Secondary | ICD-10-CM

## 2022-03-10 DIAGNOSIS — R053 Chronic cough: Secondary | ICD-10-CM

## 2022-03-10 NOTE — Progress Notes (Signed)
_0  ID: Isaac Weber, male    DOB: [birth year], 76 y.o.   MRN: [id number]  Chief Complaint  Patient presents with   Follow-up    Pt states he is improved and better since LOV.    Referring provider: Nobie Weber *  HPI: 76 year old male, former smoker quit in February 1973. PMH significant for chronic cough.  Patient of Dr. Chase Weber  Previous [facility] pulmonary encounter:  11/20/2021 -  Dr. Chase Weber  Chief Complaint  Patient presents with   Follow-up    Pt states he has been coughing up a lot of phlegm recently that is yellow in color that is thick in consistency and also has some SOB when he has coughing fits as well as has had problems with wheezing.   Chronic refractory cough  HPI Isaac Weber 76 y.o. -returns for follow-up.  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  He says the clindamycin that he took with ENT has not helped.  1 month ago ENT put him on gabapentin this was Dr. Omelia Weber no.  Has been taking gabapentin for a month but this is also not helped.  He is frustrated by it.  He is not clear as to what exactly makes his cough worse or better.  He does say that when he lies down the cough does get precipitated.  In the middle of the night it does not wake him up but when he wakes up he will have a cough.  He does have some thick yellow stringy sputum.  Small in amount.  RSI cough score is unchanged 22.  Now for the last 1 year or so his cough has been significantly worse than baseline.  It is affecting his quality of life.  He also has back pain.  He believes COVID-vaccine has made his cough worse.  We did a nitric oxide test and is borderline elevated at 36.  We discussed options.  He is agreed for bronchoscopy with lavage. Risks of pneumothorax, hemothorax, sedation/anesthesia complications such as cardiac or respiratory arrest or hypotension, stroke and bleeding all explained. Benefits of diagnosis but limitations of non-diagnosis also explained. Patient verbalized  understanding and wished to proceed.   We also discussed about empiric biologic treatment for asthma depending on the bronc results and he is agreeable for this.    He is at his wits end  12/02/2021 Patient has chronic cough. HRCT in August 2022 showed no definitive ILD. Subtle changes at lung bases felt to be non specific but could suggest mild recurrent aspiration. Multiple tiny 2-64m pulmonary nodules which were felt to reflect mucoid impaction. Mild diffuse bronchial wall thickening with mild centrilobular and paraseptal emphysema. He underwent bronchoscopy with Dr. RChase Calleron 11/26/21. BAL grew out Pseudomonas, streptococcus pneumonia and serratia. He recently had prolonged abx use with ENT, likely need IV antibiotics such as cefepime x 12 days and one more for strep coverage. He previously was noted to have a drug allergy to ciprofloxacin. He tells me he had previously been on cipro and morphine after a shoulder surgery in 1990s and lost signifcaint amount of weight. Both medications were listed as an allergy. He has since been on cipro without issues, allergy has been removed from his list.   He reports no improvement in cough with Neurontin. Vocal cord examination by ENT in October 2022 was normal. No significant breathing issues. He denies shortness of breath, chest tightness or wheezing. He is fairly inactive d/t back pain. He would like to change  inhalers as Asmanex is tier 3 drug and not formulary on this plan. FENO was 36 in April.   01/15/2022 Patient presents today for 6 week follow-up.  BAL grew out three separate microorganisms (Pseudomonas, streptococcus pneumonia and serratia). Referred to infectious disease. Last saw on 12/29/21, started on cipro 723m.  He is feeling a lot better. Reports improvement in amount of congestion and cough. He gets up phlegm 1-3 times a day. He is sleeping better. He saw no difference with Spiriva Respimat so he stopped taking this. Using hypertonic sodium  chloride neb twice a day. No issues when he use nebulizer in the morning but in the evening he notices his ankles swell. He showed me a picture of his ankles which were significantly swollen. He has trace to no leg swelling today in office.   Pulmonary function testing in 2022 showed no evidence of restrictive or obstructive lung disease.  He has no symptoms of shortness of breath or wheezing.  For now okay to stay off daily maintenance inhaler.  We will discontinue Asmanex, budesonide and Spiriva.  Continue hypertonic saline nebulizer as needed for increased chest congestion.  We will add flutter valve 3 times daily to loosen congestion.  May consider adding therapy vest in the future to help mobilize secretions if patient continues with cough.  Bronchiectasis with acute lower respiratory infection ([facility] - Chronic cough. Patient underwent bronchoscopy on 11/26/21, BAL grew out three separate microorganisms (Pseudomonas, streptococcus pneumonia and serratia). Following with infectious disease, started on oral ciprofloxacin. He has seen significant improvement in his cough and amount of congestion. Continue hypertonic saline nebulizer as needed for increased chest congestion.  We will add flutter valve 3 times daily to loosen congestion.  May consider adding therapy vest in the future to help mobilize secretions if patient continues with cough.   Centrilobular emphysema ([facility] - Pulmonary function testing in 2022 showed no evidence of restrictive or obstructive lung disease.  He has no symptoms of shortness of breath or wheezing.  For now okay to stay off daily maintenance inhaler.  We will discontinue Asmanex, budesonide and Spiriva.   Lower extremity edema - Patient noticed increased LE edema since starting hypertonic saline nebulizer, mainly in the evening. He had trace swelling on exam today. Checking BNP and BMET. Advised he wear compression stockings.    03/10/2022- INTERIM HX  Patient presents today  for 4-6 week follow-up. He is doing extremely well today. No more cough. Rarely has phlegm. He uses flutter as needed. He is off all inhalers and has not needed to use hypertonic saline nebulizer. He saw ID on 7/6, he was released from infectious disease.      Allergies  Allergen Reactions   Morphine And Related Diarrhea and Nausea And Vomiting    Immunization History  Administered Date(s) Administered   Fluad Quad(high Dose 65+) 05/13/2019, 06/09/2020, 04/21/2021   Influenza, High Dose Seasonal PF 07/15/2017, 04/12/2018   PFIZER(Purple Top)SARS-COV-2 Vaccination 02/13/2020, 03/05/2020   Pneumococcal Conjugate-13 01/28/2014   Pneumococcal Polysaccharide-23 03/04/2008, 08/26/2020   Tdap 07/12/2011   Zoster Recombinat (Shingrix) 04/12/2017, 08/17/2017    Past Medical History:  Diagnosis Date   Abnormal chest CT    Actinic keratosis    Allergic rhinitis, unspecified    Anemia    Aortic atherosclerosis (HCC)    BPH (benign prostatic hyperplasia)    COPD (chronic obstructive pulmonary disease) (HCC)    Coronary artery disease    Cough variant asthma    Glaucoma  Hypertension    Other chronic pain    Pancreatic lesion    Pure hypercholesterolemia, unspecified    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 WD SCC, ulcerated. [facility] 04/29/2020    Tobacco History: Social History   Tobacco Use  Smoking Status Former   Packs/day: 3.00   Years: 26.00   Total pack years: 78.00   Types: Cigarettes   Quit date: 09/17/1971   Years since quitting: 50.5   Passive exposure: Past  Smokeless Tobacco Never   Counseling given: Not Answered   Outpatient Medications Prior to Visit  Medication Sig  Dispense Refill   albuterol (VENTOLIN HFA) 108 (90 Base) MCG/ACT inhaler Inhale 1-2 puffs into the lungs every 4 (four) hours as needed for wheezing or shortness of breath (cough). 1 each 2   amLODipine (NORVASC) 5 MG tablet TAKE ONE TABLET BY MOUTH ONCE DAILY 90 tablet 0   baclofen (LIORESAL) 10 MG tablet Take 0.5-1 tablets (5-10 mg total) by mouth 2 (two) times daily as needed for muscle spasms. 60 each 2   Blood Glucose Monitoring Suppl (ONE TOUCH ULTRA 2) w/Device KIT Use to check blood sugar up to 2 times daily 1 kit 0   ferrous sulfate 325 (65 FE) MG tablet Take 325 mg by mouth daily with breakfast.     Glucosamine-Chondroitin (GLUCOSAMINE CHONDR COMPLEX PO) Take 1 capsule by mouth 2 (two) times daily.     hydrochlorothiazide (HYDRODIURIL) 12.5 MG tablet TAKE ONE TABLET BY MOUTH ONCE DAILY 90 tablet 0   ibuprofen (ADVIL) 800 MG tablet Take 1 tablet (800 mg total) by mouth 3 (three) times daily. 90 tablet 0   ketoconazole (NIZORAL) 2 % shampoo SHAMPOO INTO SCALP, LET SET FOR 10 MINUTES THEN WASH OFF, USE 3 TIMES PER WEEK 120 mL 3   losartan (COZAAR) 50 MG tablet TAKE ONE TABLET BY MOUTH ONCE DAILY 90 tablet 0   metFORMIN (GLUCOPHAGE-XR) 500 MG 24 hr tablet TAKE ONE TABLET BY MOUTH EVERYDAY AT BEDTIME 90 tablet 0   Multiple Minerals-Vitamins (CAL MAG ZINC +D3 PO) Take 1 tablet by mouth every evening.     Multiple Vitamin (MULTIVITAMIN WITH MINERALS) TABS tablet Take 1 tablet by mouth daily.     ONETOUCH ULTRA test strip Use to check blood sugar up to twice per day 200 each 5   potassium chloride (KLOR-CON M) 10 MEQ tablet Take 1 tablet (10 mEq total) by mouth daily. 90 tablet 1   rosuvastatin (CRESTOR) 5 MG tablet TAKE ONE TABLET BY MOUTH ONCE DAILY 90 tablet 1   sodium chloride HYPERTONIC 3 % nebulizer solution Take by nebulization as needed for other. 750 mL 12   tamsulosin (FLOMAX) 0.4 MG CAPS capsule Take 1 capsule (0.4 mg total) by mouth daily. 90 capsule 3   No facility-administered  medications prior to visit.      Review of Systems  Review of Systems   Physical Exam  BP 122/78 (BP Location: Left Arm, Patient Position: Sitting, Cuff Size: Normal)   Pulse 91   Temp (!) 97.5 F (36.4 C) (Oral)   Ht _0  (1.753 m)   Wt 158 lb 9.6 oz (71.9 kg)   SpO2 99%  BMI 23.42 kg/m  Physical Exam Constitutional:      Appearance: Normal appearance.  HENT:     Head: Normocephalic and atraumatic.  Cardiovascular:     Rate and Rhythm: Normal rate and regular rhythm.  Pulmonary:     Effort: Pulmonary effort is normal.     Breath sounds: Normal breath sounds.     Comments: Clear lungs Musculoskeletal:        General: Normal range of motion.     Cervical back: Normal range of motion and neck supple.  Skin:    General: Skin is warm and dry.  Neurological:     General: No focal deficit present.     Mental Status: He is alert and oriented to person, place, and time. Mental status is at baseline.  Psychiatric:        Mood and Affect: Mood normal.        Behavior: Behavior normal.        Thought Content: Thought content normal.        Judgment: Judgment normal.      Lab Results:  CBC    Component Value Date/Time   WBC 7.6 01/28/2022 1012   RBC 4.36 01/28/2022 1012   HGB 13.5 01/28/2022 1012   HGB 11.6 (L) 09/16/2014 1354   HCT 38.2 (L) 01/28/2022 1012   HCT 33.6 (L) 09/16/2014 1354   PLT 183 01/28/2022 1012   PLT 232 09/16/2014 1354   MCV 87.6 01/28/2022 1012   MCV 81.4 09/16/2014 1354   MCH 31.0 01/28/2022 1012   MCHC 35.3 01/28/2022 1012   RDW 12.2 01/28/2022 1012   RDW 13.9 09/16/2014 1354   LYMPHSABS 1.3 01/28/2022 1012   LYMPHSABS 1.8 09/16/2014 1354   MONOABS 0.7 01/28/2022 1012   MONOABS 0.6 09/16/2014 1354   EOSABS 0.3 01/28/2022 1012   EOSABS 0.3 09/16/2014 1354   BASOSABS 0.0 01/28/2022 1012   BASOSABS 0.0 09/16/2014 1354    BMET    Component Value Date/Time   NA 135 01/28/2022 1012   NA 140 09/16/2014 1354   K 4.0 01/28/2022  1012   K 3.7 09/16/2014 1354   CL 103 01/28/2022 1012   CO2 27 01/28/2022 1012   CO2 26 09/16/2014 1354   GLUCOSE 223 (H) 01/28/2022 1012   GLUCOSE 153 (H) 09/16/2014 1354   BUN 15 01/28/2022 1012   BUN 16.7 09/16/2014 1354   CREATININE 0.81 01/28/2022 1012   CREATININE 0.92 06/15/2021 0809   CREATININE 0.9 09/16/2014 1354   CALCIUM 9.6 01/28/2022 1012   CALCIUM 9.4 09/16/2014 1354   GFRNONAA >60 01/28/2022 1012   GFRNONAA 88 06/02/2020 0852   GFRAA 102 06/02/2020 0852    BNP No results found for: "BNP"  ProBNP    Component Value Date/Time   PROBNP 13.0 01/15/2022 1503    Imaging: [place] LIMITED JOINT SPACE STRUCTURES UP RIGHT  Result Date: 03/02/2022 Procedure:  Injection of right glenohumeral joint under ultrasound guidance. Ultrasound guidance utilized for out of plane approach to the right glenohumeral joint posteriorly, dynamic joint motion noted, no effusion noted Samsung HS60 device utilized with permanent recording / reporting. Verbal informed consent obtained and verified. Skin prepped in a sterile fashion. Ethyl chloride for topical local analgesia. Completed without difficulty and tolerated well. Medication: triamcinolone acetonide 40 mg/mL suspension for injection 1 mL total and 2 mL lidocaine 1% without epinephrine utilized for needle placement anesthetic Advised to contact for fevers/chills, erythema, induration, drainage, or persistent bleeding.   DG Cervical Spine Complete  Result Date: 03/02/2022 CLINICAL DATA:  Chronic right shoulder pain. Concern for referred pain from cervical spine. History remote cervical spine surgery in the 1990s. EXAM: CERVICAL SPINE - COMPLETE 4+ VIEW COMPARISON:  None Available. FINDINGS: Visualization of C1 through C7 on lateral view. There is 3 mm grade 1 anterolisthesis of C4 on C5 and 4 mm grade 1 anterolisthesis of C3 on C4. Vertebral body heights are maintained. Moderate atlantodens degenerative changes. The dens is intact. There is  osseous fusion of the C5-6 and C6-7 levels. Bilateral C3-4 and right C4-5 neuroforaminal narrowing on oblique views. The lateral masses of C1 are symmetrically aligned with the dens on the open-mouth odontoid view. No prevertebral soft tissue swelling.  The lung apices are clear. IMPRESSION: 1. Postsurgical changes of remote C5 through C7 osseous fusion. 2. Grade 1 anterolisthesis of C4 on C5 greater than C3 on C4. 3. 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 Electronically Signed   By: Yvonne Kendall M.D.   On: 03/02/2022 17:24   DG Shoulder Right  Addendum Date: 03/02/2022   ADDENDUM REPORT: 03/02/2022 17:23 ADDENDUM: Additional review, the right clavicle is present, and there are likely postsurgical changes of partial distal clavicle excision. IMPRESSION: 1. Mild glenohumeral osteoarthritis. 2. Postsurgical changes of partial distal clavicle excision. Electronically Signed   By: Yvonne Kendall M.D.   On: 03/02/2022 17:23   Result Date: 03/02/2022 CLINICAL DATA:  Right shoulder pain since 08/2021. EXAM: RIGHT SHOULDER - 2+ VIEW COMPARISON:  None Available. FINDINGS: Mild glenohumeral joint space narrowing. Mild inferior glenoid degenerative osteophytosis. Three screw anchors overlie the superolateral humeral head from prior surgery. The clavicle is not visualized. This appears to be new from 03/16/2021 chest CT and is presumably postsurgical. Two small ossicles are seen overlying the expected lateral aspect of the clavicle. The visualized portion of the right lung is unremarkable. IMPRESSION: 1. Mild glenohumeral osteoarthritis. 2. Apparent postsurgical changes of resection of nearly the entire right clavicle. Electronically Signed: By: Yvonne Kendall M.D. On: 03/02/2022 17:17   [place] LIMITED JOINT SPACE STRUCTURES UP RIGHT  Result Date: 02/19/2022 Procedure:  Injection of right subacromial space under ultrasound guidance. Ultrasound guidance utilized for in-plane approach to the right  subacromial space, no clear disruption in tendon architecture noted Samsung HS60 device utilized with permanent recording / reporting. Verbal informed consent obtained and verified. Skin prepped in a sterile fashion. Ethyl chloride for topical local analgesia. Completed without difficulty and tolerated well. Medication: triamcinolone acetonide 40 mg/mL suspension for injection 1 mL total and 2 mL lidocaine 1% without epinephrine utilized for needle placement anesthetic Advised to contact for fevers/chills, erythema, induration, drainage, or persistent bleeding. Procedure:  Injection of right biceps tendon sheath under ultrasound guidance. Ultrasound guidance utilized for out of plane approach to the right biceps tendon sheath, hypoechoic region circumferentially about the biceps tendon in the sheath consistent with tendinitis Samsung HS60 device utilized with permanent recording / reporting. Verbal informed consent obtained and verified. Skin prepped in a sterile fashion. Ethyl chloride for topical local analgesia. Completed without difficulty and tolerated well. Medication: triamcinolone acetonide 40 mg/mL suspension for injection 1 mL total and 2 mL lidocaine 1% without epinephrine utilized for needle placement anesthetic Advised to contact for fevers/chills, erythema, induration, drainage, or persistent bleeding.   DG PAIN CLINIC C-ARM 1-60 MIN NO REPORT  Result Date: 02/17/2022 Fluoro was used, but no Radiologist interpretation will be provided. Please refer to "NOTES" tab for provider progress note.    Assessment & Plan:  Bronchiectasis without complication [facility]) - Patient had bronchoscopy in May 2023, cultures grew out Pseudomonas, strep pneumonia and serratia. Completed course of ciprofloxacin per ID. Cough has resolved. Lungs were clear on exam. Advised he take Mucinex 600-126m twice daily and use flutter valve three times a day as needed for chest congestion. Ok to discontinue nebulizer with  [employer]. We will get updated CXR. We can hold off on CT imaging unless symptoms return/worsen.  Follow-up in 6 months with Dr. RIllene Labrador NP 03/10/2022

## 2022-03-10 NOTE — Assessment & Plan Note (Signed)
-   Patient had bronchoscopy in May 2023, cultures grew out Pseudomonas, strep pneumonia and serratia. Completed course of ciprofloxacin per ID. Cough has resolved. Lungs were clear on exam. Advised he take Mucinex 600-'1200mg'$  twice daily and use flutter valve three times a day as needed for chest congestion. Ok to discontinue nebulizer with [employer]. We will get updated CXR. We can hold off on CT imaging unless symptoms return/worsen.  Follow-up in 6 months with Dr. Chase Caller

## 2022-03-10 NOTE — Patient Instructions (Signed)
Great to see you both today, I am so glad you are feeling well [name]  Recommendations: Take mucinex 600-'1200mg'$  twice daily as needed to loose phlegm  Use flutter valve three times a day as needed for chest congestion   Orders: Discontinue nebulizer with [employer] CXR today   Follow-up: 6 months with Dr. Chase Caller

## 2022-03-12 NOTE — Progress Notes (Signed)
Please let patient know CXR showed no acute findings, no bronchiectasis visualized

## 2022-03-13 DIAGNOSIS — J4520 Mild intermittent asthma, uncomplicated: Secondary | ICD-10-CM | POA: Diagnosis not present

## 2022-03-13 DIAGNOSIS — R053 Chronic cough: Secondary | ICD-10-CM | POA: Diagnosis not present

## 2022-03-16 ENCOUNTER — Other Ambulatory Visit: Payer: PPO

## 2022-03-17 ENCOUNTER — Telehealth: Payer: PPO | Admitting: Student in an Organized Health Care Education/Training Program

## 2022-03-19 ENCOUNTER — Ambulatory Visit
Admission: RE | Admit: 2022-03-19 | Discharge: 2022-03-19 | Disposition: A | Discharge status: Home / Self Care | Payer: PPO | Source: Ambulatory Visit | Attending: Student in an Organized Health Care Education/Training Program | Admitting: Student in an Organized Health Care Education/Training Program

## 2022-03-19 DIAGNOSIS — M542 Cervicalgia: Secondary | ICD-10-CM | POA: Diagnosis not present

## 2022-03-19 DIAGNOSIS — M47812 Spondylosis without myelopathy or radiculopathy, cervical region: Secondary | ICD-10-CM | POA: Insufficient documentation

## 2022-03-19 DIAGNOSIS — M4722 Other spondylosis with radiculopathy, cervical region: Secondary | ICD-10-CM | POA: Insufficient documentation

## 2022-03-19 DIAGNOSIS — Z981 Arthrodesis status: Secondary | ICD-10-CM | POA: Diagnosis not present

## 2022-03-19 MED ORDER — GADOBUTROL 1 MMOL/ML IV SOLN
7.0000 mL | Freq: Once | INTRAVENOUS | Status: AC | PRN
  Administered 2022-03-19: 7 mL via INTRAVENOUS

## 2022-03-24 ENCOUNTER — Encounter: Payer: Self-pay | Admitting: Student in an Organized Health Care Education/Training Program

## 2022-03-24 ENCOUNTER — Ambulatory Visit
Payer: PPO | Attending: Student in an Organized Health Care Education/Training Program | Admitting: Student in an Organized Health Care Education/Training Program

## 2022-03-24 ENCOUNTER — Ambulatory Visit
Admission: RE | Admit: 2022-03-24 | Discharge: 2022-03-24 | Disposition: A | Discharge status: Home / Self Care | Payer: PPO | Source: Ambulatory Visit | Attending: Student in an Organized Health Care Education/Training Program | Admitting: Student in an Organized Health Care Education/Training Program

## 2022-03-24 VITALS — BP 126/66 | Temp 103.0°F | Resp 18 | Ht 69.0 in | Wt 158.0 lb

## 2022-03-24 DIAGNOSIS — M48061 Spinal stenosis, lumbar region without neurogenic claudication: Secondary | ICD-10-CM | POA: Diagnosis not present

## 2022-03-24 DIAGNOSIS — Z981 Arthrodesis status: Secondary | ICD-10-CM | POA: Diagnosis not present

## 2022-03-24 DIAGNOSIS — M4722 Other spondylosis with radiculopathy, cervical region: Secondary | ICD-10-CM | POA: Diagnosis not present

## 2022-03-24 DIAGNOSIS — M5416 Radiculopathy, lumbar region: Secondary | ICD-10-CM | POA: Insufficient documentation

## 2022-03-24 DIAGNOSIS — M47812 Spondylosis without myelopathy or radiculopathy, cervical region: Secondary | ICD-10-CM | POA: Diagnosis not present

## 2022-03-24 MED ORDER — SODIUM CHLORIDE (PF) 0.9 % IJ SOLN
INTRAMUSCULAR | Status: AC
  Filled 2022-03-24: qty 10

## 2022-03-24 MED ORDER — ROPIVACAINE HCL 2 MG/ML IJ SOLN
INTRAMUSCULAR | Status: AC
  Filled 2022-03-24: qty 20

## 2022-03-24 MED ORDER — IOHEXOL 180 MG/ML  SOLN
10.0000 mL | Freq: Once | INTRAMUSCULAR | Status: AC
  Administered 2022-03-24: 10 mL via EPIDURAL

## 2022-03-24 MED ORDER — ROPIVACAINE HCL 2 MG/ML IJ SOLN
2.0000 mL | Freq: Once | INTRAMUSCULAR | Status: AC
Start: 2022-03-24 — End: 2022-03-24
  Administered 2022-03-24: 2 mL via EPIDURAL

## 2022-03-24 MED ORDER — DEXAMETHASONE SODIUM PHOSPHATE 10 MG/ML IJ SOLN
10.0000 mg | Freq: Once | INTRAMUSCULAR | Status: AC
Start: 2022-03-24 — End: 2022-03-24
  Administered 2022-03-24: 10 mg

## 2022-03-24 MED ORDER — LIDOCAINE HCL 2 % IJ SOLN
INTRAMUSCULAR | Status: AC
  Filled 2022-03-24: qty 20

## 2022-03-24 MED ORDER — SODIUM CHLORIDE 0.9% FLUSH
2.0000 mL | Freq: Once | INTRAVENOUS | Status: AC
Start: 2022-03-24 — End: 2022-03-24
  Administered 2022-03-24: 2 mL

## 2022-03-24 MED ORDER — IOHEXOL 180 MG/ML  SOLN
INTRAMUSCULAR | Status: AC
  Filled 2022-03-24: qty 20

## 2022-03-24 MED ORDER — DEXAMETHASONE SODIUM PHOSPHATE 10 MG/ML IJ SOLN
INTRAMUSCULAR | Status: AC
  Filled 2022-03-24: qty 1

## 2022-03-24 MED ORDER — LIDOCAINE HCL 2 % IJ SOLN
20.0000 mL | Freq: Once | INTRAMUSCULAR | Status: AC
  Administered 2022-03-24: 400 mg

## 2022-03-24 NOTE — Progress Notes (Signed)
PROVIDER NOTE: Interpretation of information contained herein should be left to medically-trained personnel. Specific patient instructions are provided elsewhere under "Patient Instructions" section of medical record. This document was created in part using STT-dictation technology, any transcriptional errors that may result from this process are unintentional.  Patient: Isaac Weber Type: Established DOB: July 07, 1946 MRN: [id number] PCP: Olin Hauser, DO  Service: Procedure DOS: 03/24/2022 Setting: Ambulatory Location: Ambulatory outpatient facility Delivery: Face-to-face Provider: Gillis Santa, MD Specialty: Interventional Pain Management Specialty designation: 09 Location: Outpatient facility Ref. Prov.: Nobie Putnam *    Primary Reason for Visit: Interventional Pain Management Treatment. CC: Back Pain (low)   Procedure:           Type: Lumbar epidural steroid injection (LESI) (interlaminar) #2    Laterality: Right   Level:  L4-5 Level.  Imaging: Fluoroscopic guidance         Anesthesia: Local anesthesia (1-2% Lidocaine) DOS: 03/24/2022  Performed by: Gillis Santa, MD  Purpose: Diagnostic/Therapeutic Indications: Lumbar radicular pain of intraspinal etiology of more than 4 weeks that has failed to respond to conservative therapy and is severe enough to impact quality of life or function. 1. Lumbar radiculopathy   2. 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 Cervical spondylosis with radiculopathy   4. Cervical facet joint syndrome   5. S/P cervical spinal fusion    NAS-11 Pain score:   Pre-procedure: 9  (standing)/10   Post-procedure: 9 /10      Position / Prep / Materials:  Position: Prone w/ head of the table raised (slight reverse trendelenburg) to facilitate breathing.  Prep solution: DuraPrep (Iodine Povacrylex [0.7% available iodine] and Isopropyl Alcohol, 74% w/w) Prep Area: Entire Posterior Lumbar Region from lower scapular  tip down to mid buttocks area and from flank to flank. Materials:  Tray: Epidural tray Needle(s):  Type: Epidural needle (Tuohy) Gauge (G):  17 Length: Regular (3.5-in) Qty: 1  Pre-op H&P Assessment:  Isaac Weber is a 76 y.o. (year old), male patient, seen today for interventional treatment. He  has a past surgical history that includes Knee arthroscopy (Right, 1991); Bunionectomy (Left, 2003); Prostate surgery (2002); Rotator cuff repair (Left, 1999); Cervical disc surgery; Leg Surgery (Left); Rotator cuff repair (Right, 1996); Green light laser turp (transurethral resection of prostate (2010); Lumbar disc surgery (2010); Bunionectomy with hammertoe reconstruction (Left, 2013); Lumbar disc surgery (2011); Back surgery; Colonoscopy with propofol (N/A, 04/30/2021); Video bronchoscopy (N/A, 11/26/2021); and Bronchial washings (11/26/2021). Isaac Weber has a current medication list which includes the following prescription(s): albuterol, amlodipine, baclofen, one touch ultra 2, ferrous sulfate, glucosamine-chondroitin, guaifenesin, hydrochlorothiazide, ibuprofen, ketoconazole, losartan, metformin, multiple minerals-vitamins, multivitamin with minerals, onetouch ultra, potassium chloride, rosuvastatin, sodium chloride hypertonic, and tamsulosin. His primarily concern today is the Back Pain (low)  Initial Vital Signs:  Pulse/HCG Rate:   ECG Heart Rate: 94 Temp: (!) 97.2 F (36.2 C) Resp: 16 BP:  (!) 142/81 SpO2: 100 %  BMI: Estimated body mass index is 23.33 kg/m as calculated from the following:   Height as of this encounter: '5\' 9"'$  (1.753 m).   Weight as of this encounter: 158 lb (71.7 kg).  Risk Assessment: Allergies: Reviewed. He is allergic to morphine and related.  Allergy Precautions: None required Coagulopathies: Reviewed. None identified.  Blood-thinner therapy: None at this time Active Infection(s): Reviewed. None identified. Isaac Weber is afebrile  Site Confirmation: Isaac Weber was  asked to confirm the procedure and laterality before marking the site Procedure checklist: Completed Consent: Before  the procedure and under the influence of no sedative(s), amnesic(s), or anxiolytics, the patient was informed of the treatment options, risks and possible complications. To fulfill our ethical and legal obligations, as recommended by the American Medical Association's Code of Ethics, I have informed the patient of my clinical impression; the nature and purpose of the treatment or procedure; the risks, benefits, and possible complications of the intervention; the alternatives, including doing nothing; the risk(s) and benefit(s) of the alternative treatment(s) or procedure(s); and the risk(s) and benefit(s) of doing nothing. The patient was provided information about the general risks and possible complications associated with the procedure. These may include, but are not limited to: failure to achieve desired goals, infection, bleeding, organ or nerve damage, allergic reactions, paralysis, and death. In addition, the patient was informed of those risks and complications associated to Spine-related procedures, such as failure to decrease pain; infection (i.e.: Meningitis, epidural or intraspinal abscess); bleeding (i.e.: epidural hematoma, subarachnoid hemorrhage, or any other type of intraspinal or peri-dural bleeding); organ or nerve damage (i.e.: Any type of peripheral nerve, nerve root, or spinal cord injury) with subsequent damage to sensory, motor, and/or autonomic systems, resulting in permanent pain, numbness, and/or weakness of one or several areas of the body; allergic reactions; (i.e.: anaphylactic reaction); and/or death. Furthermore, the patient was informed of those risks and complications associated with the medications. These include, but are not limited to: allergic reactions (i.e.: anaphylactic or anaphylactoid reaction(s)); adrenal axis suppression; blood sugar elevation that in  diabetics may result in ketoacidosis or comma; water retention that in patients with history of congestive heart failure may result in shortness of breath, pulmonary edema, and decompensation with resultant heart failure; weight gain; swelling or edema; medication-induced neural toxicity; particulate matter embolism and blood vessel occlusion with resultant organ, and/or nervous system infarction; and/or aseptic necrosis of one or more joints. Finally, the patient was informed that Medicine is not an exact science; therefore, there is also the possibility of unforeseen or unpredictable risks and/or possible complications that may result in a catastrophic outcome. The patient indicated having understood very clearly. We have given the patient no guarantees and we have made no promises. Enough time was given to the patient to ask questions, all of which were answered to the patient's satisfaction. Mr. Ritchey has indicated that he wanted to continue with the procedure. Attestation: I, the ordering provider, attest that I have discussed with the patient the benefits, risks, side-effects, alternatives, likelihood of achieving goals, and potential problems during recovery for the procedure that I have provided informed consent. Date  Time: 03/24/2022 12:54 PM  Pre-Procedure Preparation:  Monitoring: As per clinic protocol. Respiration, ETCO2, SpO2, BP, heart rate and rhythm monitor placed and checked for adequate function Safety Precautions: Patient was assessed for positional comfort and pressure points before starting the procedure. Time-out: I initiated and conducted the "Time-out" before starting the procedure, as per protocol. The patient was asked to participate by confirming the accuracy of the "Time Out" information. Verification of the correct person, site, and procedure were performed and confirmed by me, the nursing staff, and the patient. "Time-out" conducted as per Joint Commission's Universal  Protocol (UP.01.01.01). Time: 5465  Description/Narrative of Procedure:          Target: Epidural space via interlaminar opening, initially targeting the lower laminar border of the superior vertebral body. Region: Lumbar Approach: Percutaneous paravertebral  Rationale (medical necessity): procedure needed and proper for the diagnosis and/or treatment of the patient's medical symptoms and  needs. Procedural Technique Safety Precautions: Aspiration looking for blood return was conducted prior to all injections. At no point did we inject any substances, as a needle was being advanced. No attempts were made at seeking any paresthesias. Safe injection practices and needle disposal techniques used. Medications properly checked for expiration dates. SDV (single dose vial) medications used. Description of the Procedure: Protocol guidelines were followed. The procedure needle was introduced through the skin, ipsilateral to the reported pain, and advanced to the target area. Bone was contacted and the needle walked caudad, until the lamina was cleared. The epidural space was identified using "loss-of-resistance technique" with 2-3 ml of PF-NaCl (0.9% NSS), in a 5cc LOR glass syringe.  Vitals:   03/24/22 1322 03/24/22 1328 03/24/22 1333 03/24/22 1338  BP: 121/62 114/69 128/69 126/66  Resp: '18 18 16 18  '$ Temp:      SpO2: 98% 100% 98% 100%  Weight:      Height:        Start Time: 1327 hrs. End Time: 1338 hrs.  Imaging Guidance (Spinal):          Type of Imaging Technique: Fluoroscopy Guidance (Spinal) Indication(s): Assistance in needle guidance and placement for procedures requiring needle placement in or near specific anatomical locations not easily accessible without such assistance. Exposure Time: Please see nurses notes. Contrast: Before injecting any contrast, we confirmed that the patient did not have an allergy to iodine, shellfish, or radiological contrast. Once satisfactory needle placement  was completed at the desired level, radiological contrast was injected. Contrast injected under live fluoroscopy. No contrast complications. See chart for type and volume of contrast used. Fluoroscopic Guidance: I was personally present during the use of fluoroscopy. "Tunnel Vision Technique" used to obtain the best possible view of the target area. Parallax error corrected before commencing the procedure. "Direction-depth-direction" technique used to introduce the needle under continuous pulsed fluoroscopy. Once target was reached, antero-posterior, oblique, and lateral fluoroscopic projection used confirm needle placement in all planes. Images permanently stored in EMR. Interpretation: I personally interpreted the imaging intraoperatively. Adequate needle placement confirmed in multiple planes. Appropriate spread of contrast into desired area was observed. No evidence of afferent or efferent intravascular uptake. No intrathecal or subarachnoid spread observed. Permanent images saved into the patient's record.  Antibiotic Prophylaxis:   Anti-infectives (From admission, onward)    None      Indication(s): None identified  Post-operative Assessment:  Post-procedure Vital Signs:  Pulse/HCG Rate:   (!) 106 Temp: (!) 103 F (39.4 C) Resp: 18 BP:  126/66 SpO2: 100 %  EBL: None  Complications: No immediate post-treatment complications observed by team, or reported by patient.  Note: The patient tolerated the entire procedure well. A repeat set of vitals were taken after the procedure and the patient was kept under observation following institutional policy, for this type of procedure. Post-procedural neurological assessment was performed, showing return to baseline, prior to discharge. The patient was provided with post-procedure discharge instructions, including a section on how to identify potential problems. Should any problems arise concerning this procedure, the patient was given  instructions to immediately contact us, at any time, without hesitation. In any case, we plan to contact the patient by telephone for a follow-up status report regarding this interventional procedure.  Comments:  No additional relevant information.  Plan of Care   Patient continues to have fairly severe right-sided neck pain and radiating right shoulder pain.  I spent a great deal of time reviewing his cervical MRI with  him.  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  We discussed a diagnostic cervical facet medial branch nerve block targeting the bilateral C3, C4, C5 medial branch nerves above his fusion hardware.  If this is helpful, we can consider a cervical medial branch radiofrequency ablation.   Orders:  Orders Placed This Encounter  Procedures   CERVICAL FACET (MEDIAL BRANCH NERVE BLOCK)     Standing Status:   Future    Standing Expiration Date:   06/24/2022    Scheduling Instructions:     Side: Bilateral     Level: C3-4, C4-5,  Facet joints (C3, C4, C5, Medial Branch Nerves)     Sedation: Patient's choice.     Timeframe: As soon as schedule allows    Order Specific Question:   Where will this procedure be performed?    Answer:   [facility]   DG PAIN CLINIC C-ARM 1-60 MIN NO REPORT    Intraoperative interpretation by procedural physician at [facility].    Standing Status:   Standing    Number of Occurrences:   1    Order Specific Question:   Reason for exam:    Answer:   Assistance in needle guidance and placement for procedures requiring needle placement in or near specific anatomical locations not easily accessible without such assistance.   Medications ordered for procedure: Meds ordered this encounter  Medications   iohexol (OMNIPAQUE) 180 MG/ML injection 10 mL    Must be Myelogram-compatible. If not available, you may substitute with a  water-soluble, non-ionic, hypoallergenic, myelogram-compatible radiological contrast medium.   lidocaine (XYLOCAINE) 2 % (with pres) injection 400 mg   sodium chloride flush (NS) 0.9 % injection 2 mL   ropivacaine (PF) 2 mg/mL (0.2%) (NAROPIN) injection 2 mL   dexamethasone (DECADRON) injection 10 mg   Medications administered: We administered iohexol, lidocaine, sodium chloride flush, ropivacaine (PF) 2 mg/mL (0.2%), and dexamethasone.  See the medical record for exact dosing, route, and time of administration.  Follow-up plan:   Return in about 3 weeks (around 04/14/2022) for B/L C3, 4, MBNB , in clinic NS + PPE.       R L4 +L5 TF ESI 11/25/21, right L4-L5 interlaminar ESI 02/17/22, 03/24/22    Recent Visits Date Type Provider Dept  03/09/22 Office Visit Gillis Santa, MD [facility]  02/17/22 Procedure visit Gillis Santa, MD [facility]  12/24/21 Office Visit Gillis Santa, MD [facility]  Showing recent visits within past 90 days and meeting all other requirements Today's Visits Date Type Provider Dept  03/24/22 Procedure visit Gillis Santa, MD [facility]  Showing today's visits and meeting all other requirements Future Appointments Date Type Provider Dept  04/14/22 Appointment Gillis Santa, MD [facility]  Showing future appointments within next 90 days and meeting all other requirements  Disposition: Discharge home  Discharge (Date  Time): 03/24/2022; 1350 hrs.   Primary Care Physician: Olin Hauser, DO Location: [facility] Note by: Gillis Santa, MD Date: 03/24/2022; Time: 2:02 PM  Disclaimer:  Medicine is not an Chief Strategy Officer. The only guarantee in medicine is that nothing is guaranteed. It is important to note that the decision to proceed with this intervention was based on the information collected from the patient. The Data and conclusions were drawn from the patient's  questionnaire, the interview, and the physical examination.  Because the information was provided in large part by the patient, it cannot be guaranteed that it has not been purposely or unconsciously manipulated. Every effort has been made to obtain as much relevant data as possible for this evaluation. It is important to note that the conclusions that lead to this procedure are derived in large part from the available data. Always take into account that the treatment will also be dependent on availability of resources and existing treatment guidelines, considered by other Pain Management Practitioners as being common knowledge and practice, at the time of the intervention. For Medico-Legal purposes, it is also important to point out that variation in procedural techniques and pharmacological choices are the acceptable norm. The indications, contraindications, technique, and results of the above procedure should only be interpreted and judged by a Board-Certified Interventional Pain Specialist with extensive familiarity and expertise in the same exact procedure and technique.

## 2022-03-24 NOTE — Patient Instructions (Signed)
Pain Management Discharge Instructions  General Discharge Instructions :  If you need to reach your doctor call: Monday-Friday 8:00 am - 4:00 pm at [phone] or toll free [phone].  After clinic hours [phone] to have operator reach doctor.  Bring all of your medication bottles to all your appointments in the pain clinic.  To cancel or reschedule your appointment with Pain Management please remember to call 24 hours in advance to avoid a fee.  Refer to the educational materials which you have been given on: General Risks, I had my Procedure. Discharge Instructions, Post Sedation.  Post Procedure Instructions:  The drugs you were given will stay in your system until tomorrow, so for the next 24 hours you should not drive, make any legal decisions or drink any alcoholic beverages.  You may eat anything you prefer, but it is better to start with liquids then soups and crackers, and gradually work up to solid foods.  Please notify your doctor immediately if you have any unusual bleeding, trouble breathing or pain that is not related to your normal pain.  Depending on the type of procedure that was done, some parts of your body may feel week and/or numb.  This usually clears up by tonight or the next day.  Walk with the use of an assistive device or accompanied by an adult for the 24 hours.  You may use ice on the affected area for the first 24 hours.  Put ice in a Ziploc bag and cover with a towel and place against area 15 minutes on 15 minutes off.  You may switch to heat after 24 hours.Epidural Steroid Injection Patient Information  Description: The epidural space surrounds the nerves as they exit the spinal cord.  In some patients, the nerves can be compressed and inflamed by a bulging disc or a tight spinal canal (spinal stenosis).  By injecting steroids into the epidural space, we can bring irritated nerves into direct contact with a potentially helpful medication.  These  steroids act directly on the irritated nerves and can reduce swelling and inflammation which often leads to decreased pain.  Epidural steroids may be injected anywhere along the spine and from the neck to the low back depending upon the location of your pain.   After numbing the skin with local anesthetic (like Novocaine), a small needle is passed into the epidural space slowly.  You may experience a sensation of pressure while this is being done.  The entire block usually last less than 10 minutes.  Conditions which may be treated by epidural steroids:  Low back and leg pain Neck and arm pain Spinal stenosis Post-laminectomy syndrome Herpes zoster (shingles) pain Pain from compression fractures  Preparation for the injection:  Do not eat any solid food or dairy products within 8 hours of your appointment.  You may drink clear liquids up to 3 hours before appointment.  Clear liquids include water, black coffee, juice or soda.  No milk or cream please. You may take your regular medication, including pain medications, with a sip of water before your appointment  Diabetics should hold regular insulin (if taken separately) and take 1/2 normal NPH dos the morning of the procedure.  Carry some sugar containing items with you to your appointment. A driver must accompany you and be prepared to drive you home after your procedure.  Bring all your current medications with your. An IV may be inserted and sedation may be given at the discretion of the physician.     A blood pressure cuff, EKG and other monitors will often be applied during the procedure.  Some patients may need to have extra oxygen administered for a short period. You will be asked to provide medical information, including your allergies, prior to the procedure.  We must know immediately if you are taking blood thinners (like Coumadin/Warfarin)  Or if you are allergic to IV iodine contrast (dye). We must know if you could possible be  pregnant.  Possible side-effects: Bleeding from needle site Infection (rare, may require surgery) Nerve injury (rare) Numbness & tingling (temporary) Difficulty urinating (rare, temporary) Spinal headache ( a headache worse with upright posture) Light -headedness (temporary) Pain at injection site (several days) Decreased blood pressure (temporary) Weakness in arm/leg (temporary) Pressure sensation in back/neck (temporary)  Call if you experience: Fever/chills associated with headache or increased back/neck pain. Headache worsened by an upright position. New onset weakness or numbness of an extremity below the injection site Hives or difficulty breathing (go to the emergency room) Inflammation or drainage at the infection site Severe back/neck pain Any new symptoms which are concerning to you  Please note:  Although the local anesthetic injected can often make your back or neck feel good for several hours after the injection, the pain will likely return.  It takes 3-7 days for steroids to work in the epidural space.  You may not notice any pain relief for at least that one week.  If effective, we will often do a series of three injections spaced 3-6 weeks apart to maximally decrease your pain.  After the initial series, we generally will wait several months before considering a repeat injection of the same type.  If you have any questions, please call [phone] [facility] 

## 2022-03-24 NOTE — Progress Notes (Signed)
Safety precautions to be maintained throughout the outpatient stay will include: orient to surroundings, keep bed in low position, maintain call bell within reach at all times, provide assistance with transfer out of bed and ambulation.  

## 2022-03-25 ENCOUNTER — Telehealth: Payer: Self-pay

## 2022-03-25 NOTE — Telephone Encounter (Signed)
Pt was called concerning his procedure yesterday. No problems reported.

## 2022-03-30 ENCOUNTER — Encounter: Payer: Self-pay | Admitting: Family Medicine

## 2022-03-30 ENCOUNTER — Ambulatory Visit (INDEPENDENT_AMBULATORY_CARE_PROVIDER_SITE_OTHER): Payer: PPO | Admitting: Family Medicine

## 2022-03-30 VITALS — BP 131/69 | HR 94 | Ht 69.0 in | Wt 164.4 lb

## 2022-03-30 DIAGNOSIS — J019 Acute sinusitis, unspecified: Secondary | ICD-10-CM | POA: Diagnosis not present

## 2022-03-30 MED ORDER — IPRATROPIUM BROMIDE 0.06 % NA SOLN: 2.0000 | Freq: Four times a day (QID) | NASAL | 2 refills | Status: DC

## 2022-03-30 NOTE — Patient Instructions (Addendum)
Thank you for coming to the office today.  Start Atrovent nasal spray decongestant 2 sprays in each nostril up to 4 times daily for up to 7 days or as needed longer.  Start Loratadine/Claritin '10mg'$  daily for antihistamine  May add Sudafed OTC if need in future.  Continue current Mucinex  ------------  Lungs clear today.   For the blood pressure.  Try the following  Double dose Losartan from 50 to '100mg'$  daily  May HOLD the Hydrochlorothiazide HCTZ 12.'5mg'$  and Potassium if you prefer.  Otherwise next move would be double Amlodipine from 5 to '10mg'$  and keep Losartan '50mg'$  and HOLD off on the HCTZ.   Please schedule a Follow-up Appointment to: Return if symptoms worsen or fail to improve.  If you have any other questions or concerns, please feel free to call the office or send a message through [place]. You may also schedule an earlier appointment if necessary.  Additionally, you may be receiving a survey about your experience at our office within a few days to 1 week by e-mail or mail. We value your feedback.  Nobie Putnam, DO [place]

## 2022-03-30 NOTE — Progress Notes (Signed)
Subjective:    Patient ID: Isaac Weber, male    DOB: 1946-05-17, 76 y.o.   MRN: [id number]  Isaac Weber is a 76 y.o. male presenting on 03/30/2022 for Nasal Congestion   HPI  Sinusitis / Nasal Congestion Reports significant sinus drainage congestion with yellow discoloration and large amounts 3-4 times a day. He has concerns that this could worsen and get into his lungs. He is cautious with antibiotic. - he has taken Mucinex to avoid chest congestion and using Flutter valve for lungs as well. No allergies. Evaluated by Allergist. Using [place] nasal saline No fever or chills Denies dyspnea  Bronchiectasis Followed by [facility] Last visit 03/10/22, improving overall. Recent X-ray. Improved results. Upcoming CT in future as planned.      03/24/2022    1:00 PM 03/02/2022    1:36 PM 02/17/2022   11:02 AM  Depression screen PHQ 2/9  Decreased Interest 0 0 0  Down, Depressed, Hopeless 0 0 0  PHQ - 2 Score 0 0 0  Altered sleeping  0   Tired, decreased energy  0   Change in appetite  0   Feeling bad or failure about yourself   0   Trouble concentrating  0   Moving slowly or fidgety/restless  0   Suicidal thoughts  0   PHQ-9 Score  0   Difficult doing work/chores  Not difficult at all     Social History   Tobacco Use   Smoking status: Former    Packs/day: 3.00    Years: 26.00    Total pack years: 78.00    Types: Cigarettes    Quit date: 09/17/1971    Years since quitting: 50.5    Passive exposure: Past   Smokeless tobacco: Never  Vaping Use   Vaping Use: Never used  Substance Use Topics   Alcohol use: Yes    Alcohol/week: 2.0 standard drinks of alcohol    Types: 2 Standard drinks or equivalent per week   Drug use: Never    Review of Systems Per HPI unless specifically indicated above     Objective:    BP 131/69   Pulse 94   Ht '5\' 9"'$  (1.753 m)   Wt 164 lb 6.4 oz (74.6 kg)   SpO2 98%   BMI 24.28 kg/m   Wt Readings from  Last 3 Encounters:  03/30/22 164 lb 6.4 oz (74.6 kg)  03/24/22 158 lb (71.7 kg)  03/10/22 158 lb 9.6 oz (71.9 kg)    Physical Exam Vitals and nursing note reviewed.  Constitutional:      General: He is not in acute distress.    Appearance: He is well-developed. He is not diaphoretic.     Comments: Well-appearing, comfortable, cooperative  HENT:     Head: Normocephalic and atraumatic.  Eyes:     General:        Right eye: No discharge.        Left eye: No discharge.     Conjunctiva/sclera: Conjunctivae normal.  Neck:     Thyroid: No thyromegaly.  Cardiovascular:     Rate and Rhythm: Normal rate and regular rhythm.     Pulses: Normal pulses.     Heart sounds: Normal heart sounds. No murmur heard. Pulmonary:     Effort: Pulmonary effort is normal. No respiratory distress.     Breath sounds: Normal breath sounds. No wheezing or rales.     Comments: No focal lung abnormality  Musculoskeletal:        General: Normal range of motion.     Cervical back: Normal range of motion and neck supple.  Lymphadenopathy:     Cervical: No cervical adenopathy.  Skin:    General: Skin is warm and dry.     Findings: No erythema or rash.  Neurological:     Mental Status: He is alert and oriented to person, place, and time. Mental status is at baseline.  Psychiatric:        Behavior: Behavior normal.     Comments: Well groomed, good eye contact, normal speech and thoughts     I have personally reviewed the radiology report from 03/10/22.  CLINICAL DATA:  Follow-up bronchiectasis.  No acute symptoms.   EXAM: CHEST - 2 VIEW   COMPARISON:  Chest radiograph 01/16/2021.  Chest CT 03/16/2021   FINDINGS: The cardiomediastinal contours are normal. No bronchiectasis visualized by radiograph. Pulmonary vasculature is normal. No consolidation, pleural effusion, or pneumothorax. Stable chronic change in the thoracic spine. No acute osseous abnormalities are seen.   IMPRESSION: No acute chest  findings.  No bronchiectasis defined by radiograph.     Electronically Signed   By: Keith Rake M.D.   On: 03/12/2022 10:47  Results for orders placed or performed during the hospital encounter of 01/28/22  Comprehensive metabolic panel  Result Value Ref Range   Sodium 135 135 - 145 mmol/L   Potassium 4.0 3.5 - 5.1 mmol/L   Chloride 103 98 - 111 mmol/L   CO2 27 22 - 32 mmol/L   Glucose, Bld 223 (H) 70 - 99 mg/dL   BUN 15 8 - 23 mg/dL   Creatinine, Ser 0.81 0.61 - 1.24 mg/dL   Calcium 9.6 8.9 - 10.3 mg/dL   Total Protein 6.8 6.5 - 8.1 g/dL   Albumin 3.9 3.5 - 5.0 g/dL   AST 24 15 - 41 U/L   ALT 23 0 - 44 U/L   Alkaline Phosphatase 66 38 - 126 U/L   Total Bilirubin 1.0 0.3 - 1.2 mg/dL   GFR, Estimated >60 >60 mL/min   Anion gap 5 5 - 15  CBC with Differential/Platelet  Result Value Ref Range   WBC 7.6 4.0 - 10.5 K/uL   RBC 4.36 4.22 - 5.81 MIL/uL   Hemoglobin 13.5 13.0 - 17.0 g/dL   HCT 38.2 (L) 39.0 - 52.0 %   MCV 87.6 80.0 - 100.0 fL   MCH 31.0 26.0 - 34.0 pg   MCHC 35.3 30.0 - 36.0 g/dL   RDW 12.2 11.5 - 15.5 %   Platelets 183 150 - 400 K/uL   nRBC 0.0 0.0 - 0.2 %   Neutrophils Relative % 68 %   Neutro Abs 5.2 1.7 - 7.7 K/uL   Lymphocytes Relative 17 %   Lymphs Abs 1.3 0.7 - 4.0 K/uL   Monocytes Relative 9 %   Monocytes Absolute 0.7 0.1 - 1.0 K/uL   Eosinophils Relative 4 %   Eosinophils Absolute 0.3 0.0 - 0.5 K/uL   Basophils Relative 1 %   Basophils Absolute 0.0 0.0 - 0.1 K/uL   Immature Granulocytes 1 %   Abs Immature Granulocytes 0.04 0.00 - 0.07 K/uL      Assessment & Plan:   Problem List Items Addressed This Visit   None Visit Diagnoses     Acute rhinosinusitis    -  Primary   Relevant Medications   ipratropium (ATROVENT) 0.06 % nasal spray  Sinuses likely without infection Goal to limit drainage to avoid lower respiratory infection  Start Atrovent nasal spray decongestant 2 sprays in each nostril up to 4 times daily for up to 7 days  or as needed longer.  Start Loratadine/Claritin '10mg'$  daily for antihistamine  May add Sudafed OTC if need in future.  Continue current Mucinex  Defer antibiotics at this time if possible.  ------------  Lungs clear today.   For the blood pressure.  Try the following for goal to come off HCTZ / K, we can adjust other meds.  Double dose Losartan from 50 to '100mg'$  daily  May HOLD the Hydrochlorothiazide HCTZ 12.'5mg'$  and Potassium if you prefer.  Otherwise next move would be double Amlodipine from 5 to '10mg'$  and keep Losartan '50mg'$  and HOLD off on the HCTZ.   Meds ordered this encounter  Medications   ipratropium (ATROVENT) 0.06 % nasal spray    Sig: Place 2 sprays into both nostrils 4 (four) times daily. For up to 5-7 days then stop and use as needed.    Dispense:  15 mL    Refill:  2      Follow up plan: Return if symptoms worsen or fail to improve.   Nobie Putnam, [facility] 03/30/2022, 4:01 PM

## 2022-04-07 ENCOUNTER — Encounter: Payer: Self-pay | Admitting: Student in an Organized Health Care Education/Training Program

## 2022-04-08 ENCOUNTER — Encounter: Payer: Self-pay | Admitting: Family Medicine

## 2022-04-08 DIAGNOSIS — J9801 Acute bronchospasm: Secondary | ICD-10-CM

## 2022-04-08 MED ORDER — PREDNISONE 20 MG PO TABS: 40.0000 mg | ORAL_TABLET | Freq: Every day | ORAL | 0 refills | Status: DC

## 2022-04-08 NOTE — Addendum Note (Signed)
Addended by: Olin Hauser on: 04/08/2022 03:13 PM   Modules accepted: Orders

## 2022-04-12 ENCOUNTER — Encounter: Payer: Self-pay | Admitting: Family Medicine

## 2022-04-12 DIAGNOSIS — J479 Bronchiectasis, uncomplicated: Secondary | ICD-10-CM

## 2022-04-12 DIAGNOSIS — Z8701 Personal history of pneumonia (recurrent): Secondary | ICD-10-CM

## 2022-04-13 ENCOUNTER — Ambulatory Visit: Payer: PPO

## 2022-04-13 NOTE — Telephone Encounter (Signed)
Left for your review. Note from 9/14 said to defer abx if possible. Mychart communication does not mention anything about abx course.

## 2022-04-14 ENCOUNTER — Ambulatory Visit
Admission: RE | Admit: 2022-04-14 | Discharge: 2022-04-14 | Disposition: A | Discharge status: Home / Self Care | Payer: PPO | Source: Ambulatory Visit | Attending: Student in an Organized Health Care Education/Training Program | Admitting: Student in an Organized Health Care Education/Training Program

## 2022-04-14 ENCOUNTER — Encounter: Payer: Self-pay | Admitting: Student in an Organized Health Care Education/Training Program

## 2022-04-14 ENCOUNTER — Telehealth: Payer: Self-pay | Admitting: Infectious Diseases

## 2022-04-14 ENCOUNTER — Encounter: Payer: Self-pay | Admitting: Infectious Diseases

## 2022-04-14 ENCOUNTER — Ambulatory Visit
Payer: PPO | Attending: Student in an Organized Health Care Education/Training Program | Admitting: Student in an Organized Health Care Education/Training Program

## 2022-04-14 VITALS — BP 119/78 | HR 126 | Temp 98.1°F | Resp 13 | Ht 69.0 in | Wt 159.0 lb

## 2022-04-14 DIAGNOSIS — M961 Postlaminectomy syndrome, not elsewhere classified: Secondary | ICD-10-CM | POA: Diagnosis not present

## 2022-04-14 DIAGNOSIS — Z981 Arthrodesis status: Secondary | ICD-10-CM | POA: Diagnosis not present

## 2022-04-14 DIAGNOSIS — M47812 Spondylosis without myelopathy or radiculopathy, cervical region: Secondary | ICD-10-CM | POA: Insufficient documentation

## 2022-04-14 DIAGNOSIS — Z9889 Other specified postprocedural states: Secondary | ICD-10-CM | POA: Diagnosis not present

## 2022-04-14 DIAGNOSIS — M48062 Spinal stenosis, lumbar region with neurogenic claudication: Secondary | ICD-10-CM | POA: Diagnosis not present

## 2022-04-14 DIAGNOSIS — M48061 Spinal stenosis, lumbar region without neurogenic claudication: Secondary | ICD-10-CM | POA: Insufficient documentation

## 2022-04-14 DIAGNOSIS — M5416 Radiculopathy, lumbar region: Secondary | ICD-10-CM | POA: Diagnosis not present

## 2022-04-14 DIAGNOSIS — M4722 Other spondylosis with radiculopathy, cervical region: Secondary | ICD-10-CM | POA: Insufficient documentation

## 2022-04-14 MED ORDER — DEXAMETHASONE SODIUM PHOSPHATE 10 MG/ML IJ SOLN
INTRAMUSCULAR | Status: AC
  Filled 2022-04-14: qty 2

## 2022-04-14 MED ORDER — LIDOCAINE HCL 2 % IJ SOLN
20.0000 mL | Freq: Once | INTRAMUSCULAR | Status: AC
  Administered 2022-04-14: 400 mg

## 2022-04-14 MED ORDER — ROPIVACAINE HCL 2 MG/ML IJ SOLN
INTRAMUSCULAR | Status: AC
  Filled 2022-04-14: qty 20

## 2022-04-14 MED ORDER — DEXAMETHASONE SODIUM PHOSPHATE 10 MG/ML IJ SOLN
10.0000 mg | Freq: Once | INTRAMUSCULAR | Status: AC
  Administered 2022-04-14: 10 mg

## 2022-04-14 MED ORDER — LIDOCAINE HCL 2 % IJ SOLN
INTRAMUSCULAR | Status: AC
  Filled 2022-04-14: qty 20

## 2022-04-14 MED ORDER — ROPIVACAINE HCL 2 MG/ML IJ SOLN
9.0000 mL | Freq: Once | INTRAMUSCULAR | Status: AC
  Administered 2022-04-14: 9 mL via PERINEURAL

## 2022-04-14 NOTE — Progress Notes (Signed)
Safety precautions to be maintained throughout the outpatient stay will include: orient to surroundings, keep bed in low position, maintain call bell within reach at all times, provide assistance with transfer out of bed and ambulation.  

## 2022-04-14 NOTE — Patient Instructions (Signed)

## 2022-04-14 NOTE — Addendum Note (Signed)
Addended by: Olin Hauser on: 04/14/2022 01:23 PM   Modules accepted: Orders

## 2022-04-14 NOTE — Progress Notes (Signed)
PROVIDER NOTE: Interpretation of information contained herein should be left to medically-trained personnel. Specific patient instructions are provided elsewhere under "Patient Instructions" section of medical record. This document was created in part using STT-dictation technology, any transcriptional errors that may result from this process are unintentional.  Patient: Isaac Weber Type: Established DOB: 11-29-1945 MRN: [id number] PCP: Isaac Hauser, DO  Service: Procedure DOS: 04/14/2022 Setting: Ambulatory Location: Ambulatory outpatient facility Delivery: Face-to-face Provider: Gillis Santa, MD Specialty: Interventional Pain Management Specialty designation: 09 Location: Outpatient facility Ref. Prov.: Isaac Weber *     Procedure:           Type: Cervical Facet Medial Branch Block(s) #1  Laterality: Bilateral  Level: C3, C4,  Medial Branch Level(s). Injecting these levels blocks the C3-4, cervical facet joints.  Imaging: Fluoroscopic guidance Anesthesia: Local anesthesia (1-2% Lidocaine) DOS: 04/14/2022  Performed by: Isaac Santa, MD  Purpose: Diagnostic/Therapeutic Indications: Cervicalgia (cervical spine axial pain) severe enough to impact quality of life or function.  CLINICAL DATA:  Neck pain, chronic, degenerative changes on xray. Prior C-spine fusion, now having severe right neck and shoulder pain, concern for C4/5 radiculopathy.   EXAM: MRI CERVICAL SPINE WITHOUT AND WITH CONTRAST   TECHNIQUE: Multiplanar and multiecho pulse sequences of the cervical spine, to include the craniocervical junction and cervicothoracic junction, were obtained without and with intravenous contrast.   CONTRAST:  73m GADAVIST GADOBUTROL 1 MMOL/ML IV SOLN   COMPARISON:  Cervical spine radiographs 03/02/2022   FINDINGS: Alignment: Minimal anterolisthesis of C3 on C4, C4 on C5, and C5 on C6.   Vertebrae: No fracture, suspicious marrow lesion, or  significant marrow edema. Solid interbody osseous fusion at C5-6 and C6-7.   Cord: Normal cord signal and morphology.  No abnormal enhancement.   Posterior Fossa, vertebral arteries, paraspinal tissues: Unremarkable.   Disc levels:   C2-3: Minimal disc bulging, uncovertebral spurring, and moderate to severe right and mild left facet arthrosis without significant stenosis.   C3-4: Mild disc space narrowing. Anterolisthesis with bulging uncovered disc, uncovertebral spurring, and severe facet arthrosis result in mild spinal stenosis and moderate bilateral neural foraminal stenosis.   C4-5: Mild disc space narrowing. Anterolisthesis with bulging uncovered disc, asymmetric right uncovertebral spurring, and severe right and moderate left facet arthrosis result in mild spinal stenosis and moderate right neural foraminal stenosis.   C5-6: Anterior fusion.  No stenosis.   C6-7: Anterior fusion.  No stenosis.   C7-T1: Moderate disc space narrowing. Disc bulging, uncovertebral spurring, and mild-to-moderate facet arthrosis result in mild-to-moderate right neural foraminal stenosis without spinal stenosis.   IMPRESSION: 1. Solid anterior fusion at C5-6 and C6-7 without residual stenosis. 2. Severe upper cervical facet arthrosis with mild spinal stenosis and moderate neural foraminal stenosis at C3-4 and C4-5. 3. Mild-to-moderate right neural foraminal stenosis at C7-T1.     Electronically Signed   By: ALogan BoresM.D.   On: 03/20/2022 11:50     1. Cervical spondylosis with radiculopathy   2. Cervical facet joint syndrome   3. S/P cervical spinal fusion   4. Spinal stenosis, lumbar region, with neurogenic claudication   5. 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 Lumbar radiculopathy   7. Failed back surgical syndrome   8. History of lumbar surgery    NAS-11 Pain score:   Pre-procedure: 8 /10   Post-procedure: 3 /10     Position / Prep / Materials:   Position: Prone. Head in cradle. C-spine slightly flexed. Prep solution:  DuraPrep (Iodine Povacrylex [0.7% available iodine] and Isopropyl Alcohol, 74% w/w) Prep Area: Posterior Cervico-thoracic Region. From occipital ridge to tip of scapula, and from shoulder to shoulder. Entire posterior and lateral neck surface. Materials:  Tray: Block Needle(s):  Type: Spinal  Gauge (G): 22"  Length: 3.5-in  Qty: 2  Pre-op H&P Assessment:  Isaac Weber is a 76 y.o. (year old), male patient, seen today for interventional treatment. He  has a past surgical history that includes Knee arthroscopy (Right, 1991); Bunionectomy (Left, 2003); Prostate surgery (2002); Rotator cuff repair (Left, 1999); Cervical disc surgery; Leg Surgery (Left); Rotator cuff repair (Right, 1996); Green light laser turp (transurethral resection of prostate (2010); Lumbar disc surgery (2010); Bunionectomy with hammertoe reconstruction (Left, 2013); Lumbar disc surgery (2011); Back surgery; Colonoscopy with propofol (N/A, 04/30/2021); Video bronchoscopy (N/A, 11/26/2021); and Bronchial washings (11/26/2021). Isaac Weber has a current medication list which includes the following prescription(s): albuterol, amlodipine, baclofen, one touch ultra 2, ferrous sulfate, glucosamine-chondroitin, guaifenesin, hydrochlorothiazide, ibuprofen, ipratropium, ketoconazole, losartan, metformin, multiple minerals-vitamins, multivitamin with minerals, onetouch ultra, potassium chloride, rosuvastatin, sodium chloride hypertonic, tamsulosin, and prednisone. His primarily concern today is the Shoulder Pain (right)  Initial Vital Signs:  Pulse/HCG Rate:  (!) 126 ECG Heart Rate: (!) 121 Temp: 98.1 F (36.7 C) Resp: 16 BP:  (!) 151/81 SpO2: 100 %  BMI: Estimated body mass index is 23.48 kg/m as calculated from the following:   Height as of this encounter: '5\' 9"'$  (1.753 m).   Weight as of this encounter: 159 lb (72.1 kg).  Risk Assessment: Allergies: Reviewed.  He is allergic to morphine and related.  Allergy Precautions: None required Coagulopathies: Reviewed. None identified.  Blood-thinner therapy: None at this time Active Infection(s): Reviewed. None identified. Isaac Weber is afebrile  Site Confirmation: Isaac Weber was asked to confirm the procedure and laterality before marking the site Procedure checklist: Completed Consent: Before the procedure and under the influence of no sedative(s), amnesic(s), or anxiolytics, the patient was informed of the treatment options, risks and possible complications. To fulfill our ethical and legal obligations, as recommended by the American Medical Association's Code of Ethics, I have informed the patient of my clinical impression; the nature and purpose of the treatment or procedure; the risks, benefits, and possible complications of the intervention; the alternatives, including doing nothing; the risk(s) and benefit(s) of the alternative treatment(s) or procedure(s); and the risk(s) and benefit(s) of doing nothing. The patient was provided information about the general risks and possible complications associated with the procedure. These may include, but are not limited to: failure to achieve desired goals, infection, bleeding, organ or nerve damage, allergic reactions, paralysis, and death. In addition, the patient was informed of those risks and complications associated to Spine-related procedures, such as failure to decrease pain; infection (i.e.: Meningitis, epidural or intraspinal abscess); bleeding (i.e.: epidural hematoma, subarachnoid hemorrhage, or any other type of intraspinal or peri-dural bleeding); organ or nerve damage (i.e.: Any type of peripheral nerve, nerve root, or spinal cord injury) with subsequent damage to sensory, motor, and/or autonomic systems, resulting in permanent pain, numbness, and/or weakness of one or several areas of the body; allergic reactions; (i.e.: anaphylactic reaction); and/or  death. Furthermore, the patient was informed of those risks and complications associated with the medications. These include, but are not limited to: allergic reactions (i.e.: anaphylactic or anaphylactoid reaction(s)); adrenal axis suppression; blood sugar elevation that in diabetics may result in ketoacidosis or comma; water retention that in patients with history of congestive heart failure  may result in shortness of breath, pulmonary edema, and decompensation with resultant heart failure; weight gain; swelling or edema; medication-induced neural toxicity; particulate matter embolism and blood vessel occlusion with resultant organ, and/or nervous system infarction; and/or aseptic necrosis of one or more joints. Finally, the patient was informed that Medicine is not an exact science; therefore, there is also the possibility of unforeseen or unpredictable risks and/or possible complications that may result in a catastrophic outcome. The patient indicated having understood very clearly. We have given the patient no guarantees and we have made no promises. Enough time was given to the patient to ask questions, all of which were answered to the patient's satisfaction. Mr. Czerniak has indicated that he wanted to continue with the procedure. Attestation: I, the ordering provider, attest that I have discussed with the patient the benefits, risks, side-effects, alternatives, likelihood of achieving goals, and potential problems during recovery for the procedure that I have provided informed consent. Date  Time: 04/14/2022  1:36 PM  Pre-Procedure Preparation:  Monitoring: As per clinic protocol. Respiration, ETCO2, SpO2, BP, heart rate and rhythm monitor placed and checked for adequate function Safety Precautions: Patient was assessed for positional comfort and pressure points before starting the procedure. Time-out: I initiated and conducted the "Time-out" before starting the procedure, as per protocol. The  patient was asked to participate by confirming the accuracy of the "Time Out" information. Verification of the correct person, site, and procedure were performed and confirmed by me, the nursing staff, and the patient. "Time-out" conducted as per Joint Commission's Universal Protocol (UP.01.01.01). Time: 1430  Description/Narrative of Procedure:          Laterality: Bilateral. The procedure was performed in identical fashion on both sides. Targeted Levels:  C3, C4,  Medial Branch Level(s).  Rationale (medical necessity): procedure needed and proper for the diagnosis and/or treatment of the patient's medical symptoms and needs. Procedural Technique Safety Precautions: Aspiration looking for blood return was conducted prior to all injections. At no point did we inject any substances, as a needle was being advanced. No attempts were made at seeking any paresthesias. Safe injection practices and needle disposal techniques used. Medications properly checked for expiration dates. SDV (single dose vial) medications used. Description of the Procedure: Protocol guidelines were followed. The patient was assisted into a comfortable position. The target area was identified and the area prepped in the usual manner. Skin & deeper tissues infiltrated with local anesthetic. Appropriate amount of time allowed to pass for local anesthetics to take effect. The procedure needles were then advanced to the target area. Proper needle placement secured. Negative aspiration confirmed. Solution injected in intermittent fashion, asking for systemic symptoms every 0.5cc of injectate. The needles were then removed and the area cleansed, making sure to leave some of the prepping solution back to take advantage of its long term bactericidal properties.  Technical description of process:  C3 Medial Branch Nerve Block (MBB): The target area for the C3 dorsal medial articular branch is the lateral concave waist of the articular pillar of  C3. Under fluoroscopic guidance, a Quincke needle was inserted until contact was made with os over the postero-lateral aspect of the articular pillar of C3 (target area). After negative aspiration for blood, 4m of the nerve block solution was injected without difficulty or complication. The needle was removed intact. C4 Medial Branch Nerve Block (MBB): The target area for the C4 dorsal medial articular branch is the lateral concave waist of the articular pillar of C4. Under  fluoroscopic guidance, a Quincke needle was inserted until contact was made with os over the postero-lateral aspect of the articular pillar of C4 (target area). After negative aspiration for blood, 39m of the nerve block solution was injected without difficulty or complication. The needle was removed intact.   8 cc solution made of 6 cc of 0.2% ropivacaine, 2 cc of Decadron 10 mg/cc.  2 cc injected at each level above bilaterally.    Once the entire procedure was completed, the treated area was cleaned, making sure to leave some of the prepping solution back to take advantage of its long term bactericidal properties.  Anatomy Reference Guide:       Vitals:   04/14/22 1424 04/14/22 1427 04/14/22 1432 04/14/22 1437  BP: 136/80 113/76 112/81 119/78  Pulse:      Resp: '19 19 17 13  '$ Temp:      SpO2: 99% 99% 100% 100%  Weight:      Height:         Start Time: 1430 hrs. End Time: 1436 hrs.  Imaging Guidance (Spinal):          Type of Imaging Technique: Fluoroscopy Guidance (Spinal) Indication(s): Assistance in needle guidance and placement for procedures requiring needle placement in or near specific anatomical locations not easily accessible without such assistance. Exposure Time: Please see nurses notes. Contrast: None used. Fluoroscopic Guidance: I was personally present during the use of fluoroscopy. "Tunnel Vision Technique" used to obtain the best possible view of the target area. Parallax error corrected before  commencing the procedure. "Direction-depth-direction" technique used to introduce the needle under continuous pulsed fluoroscopy. Once target was reached, antero-posterior, oblique, and lateral fluoroscopic projection used confirm needle placement in all planes. Images permanently stored in EMR. Interpretation: No contrast injected. I personally interpreted the imaging intraoperatively. Adequate needle placement confirmed in multiple planes. Permanent images saved into the patient's record.  Post-operative Assessment:  Post-procedure Vital Signs:  Pulse/HCG Rate:  (!) 126 (!) 114 Temp: 98.1 F (36.7 C) Resp: 13 BP: 119/78 SpO2: 100 %  EBL: None  Complications: No immediate post-treatment complications observed by team, or reported by patient.  Note: The patient tolerated the entire procedure well. A repeat set of vitals were taken after the procedure and the patient was kept under observation following institutional policy, for this type of procedure. Post-procedural neurological assessment was performed, showing return to baseline, prior to discharge. The patient was provided with post-procedure discharge instructions, including a section on how to identify potential problems. Should any problems arise concerning this procedure, the patient was given instructions to immediately contact [place] at any time, without hesitation. In any case, we plan to contact the patient by telephone for a follow-up status report regarding this interventional procedure.  Comments:  No additional relevant information.  Plan of CKendall Westcontinues to struggle with low back pain and lower extremity weakness.  Unfortunately his second epidural at L4-L5 was not helpful and actually increased his pain for a couple of days.  We increased the volume of medication added which could have aggravated his right leg pain.  He has severe spinal stenosis at L4-L5 with associated neurogenic claudication.  I will refer him to  Dr. YCari Carawayfor further evaluation to see if surgery would be helpful.  He has a history of prior L1-L2 discectomy.  I have discussed spinal cord stimulation with MRonalee Beltsbut I am concerned about his severe canal stenosis at L4-L5 that is contributing to his neurogenic  claudication and lower extremity weakness.  He finds ADLs difficult to perform.  He tries to do home strengthening and stretching exercises.    Orders:  Orders Placed This Encounter  Procedures   DG PAIN [facility] C-ARM 1-60 MIN NO REPORT    Intraoperative interpretation by procedural physician at [place].    Standing Status:   Standing    Number of Occurrences:   1    Order Specific Question:   Reason for exam:    Answer:   Assistance in needle guidance and placement for procedures requiring needle placement in or near specific anatomical locations not easily accessible without such assistance.   Ambulatory referral to Neurosurgery    Referral Priority:   Routine    Referral Type:   Surgical    Referral Reason:   Specialty Services Required    Referred to Provider:   Meade Maw, MD    Requested Specialty:   Neurosurgery    Number of Visits Requested:   1   Medications ordered for procedure: Meds ordered this encounter  Medications   lidocaine (XYLOCAINE) 2 % (with pres) injection 400 mg   ropivacaine (PF) 2 mg/mL (0.2%) (NAROPIN) injection 9 mL   dexamethasone (DECADRON) injection 10 mg   Medications administered: We administered lidocaine, ropivacaine (PF) 2 mg/mL (0.2%), and dexamethasone.  See the medical record for exact dosing, route, and time of administration.  Follow-up plan:   Return in about 4 weeks (around 05/12/2022) for Post Procedure Evaluation, virtual.       R L4 +L5 TF ESI 11/25/21, right L4-L5 interlaminar ESI 02/17/22, 03/24/22, B/L C3,4 MBNB 04/14/22     Recent Visits Date Type Provider Dept  03/24/22 Procedure visit Isaac Santa, MD [facility]  03/09/22 Office  Visit Isaac Santa, MD [facility]  02/17/22 Procedure visit Isaac Santa, MD [facility]  Showing recent visits within past 90 days and meeting all other requirements Today's Visits Date Type Provider Dept  04/14/22 Procedure visit Isaac Santa, MD [facility]  Showing today's visits and meeting all other requirements Future Appointments Date Type Provider Dept  05/17/22 Appointment Isaac Santa, MD [facility]  Showing future appointments within next 90 days and meeting all other requirements  Disposition: Discharge home  Discharge (Date  Time): 04/14/2022; 1442 hrs.   Primary Care Physician: Isaac Hauser, DO Location: [facility] Note by: Isaac Santa, MD Date: 04/14/2022; Time: 2:59 PM  Disclaimer:  Medicine is not an exact science. The only guarantee in medicine is that nothing is guaranteed. It is important to note that the decision to proceed with this intervention was based on the information collected from the patient. The Data and conclusions were drawn from the patient's questionnaire, the interview, and the physical examination. Because the information was provided in large part by the patient, it cannot be guaranteed that it has not been purposely or unconsciously manipulated. Every effort has been made to obtain as much relevant data as possible for this evaluation. It is important to note that the conclusions that lead to this procedure are derived in large part from the available data. Always take into account that the treatment will also be dependent on availability of resources and existing treatment guidelines, considered by other Pain Management Practitioners as being common knowledge and practice, at the time of the intervention. For Medico-Legal purposes, it is also important to point out that variation in procedural techniques and pharmacological choices are the  acceptable norm. The  indications, contraindications, technique, and results of the above procedure should only be interpreted and judged by a Board-Certified Interventional Pain Specialist with extensive familiarity and expertise in the same exact procedure and technique.

## 2022-04-14 NOTE — Telephone Encounter (Signed)
Pt has bronchiectasis and a few months ago was treated with a prolonged course of cipro for pseudomonas, serratia and strep pneum in the BAL culture from may 2023- It subsided -He now has more phlegm. He does not want to take cipro again- told him to get a sputum culture and depending on that he can choose the antibiotic.

## 2022-04-15 ENCOUNTER — Telehealth: Payer: Self-pay

## 2022-04-15 ENCOUNTER — Encounter: Payer: Self-pay | Admitting: Student in an Organized Health Care Education/Training Program

## 2022-04-15 MED ORDER — AZITHROMYCIN 250 MG PO TABS: ORAL_TABLET | ORAL | 0 refills | Status: DC

## 2022-04-15 NOTE — Addendum Note (Signed)
Addended by: Olin Hauser on: 04/15/2022 01:41 PM   Modules accepted: Orders

## 2022-04-15 NOTE — Telephone Encounter (Signed)
Post procedure follow up.  Patients wife states he is doing good.

## 2022-04-16 ENCOUNTER — Encounter: Payer: Self-pay | Admitting: Infectious Diseases

## 2022-04-16 ENCOUNTER — Ambulatory Visit (INDEPENDENT_AMBULATORY_CARE_PROVIDER_SITE_OTHER): Payer: PPO

## 2022-04-16 VITALS — BP 122/64 | Ht 69.0 in | Wt 161.6 lb

## 2022-04-16 DIAGNOSIS — Z0000 Encounter for general adult medical examination without abnormal findings: Secondary | ICD-10-CM | POA: Diagnosis not present

## 2022-04-16 NOTE — Patient Instructions (Signed)
Isaac Weber , Thank you for taking time to come for your Medicare Wellness Visit. I appreciate your ongoing commitment to your health goals. Please review the following plan we discussed and let me know if I can assist you in the future.   Screening recommendations/referrals: Colonoscopy: 04/30/21 Recommended yearly ophthalmology/optometry visit for glaucoma screening and checkup Recommended yearly dental visit for hygiene and checkup  Vaccinations: Influenza vaccine: 04/21/21 Pneumococcal vaccine: 08/26/20 Tdap vaccine: 07/12/11 Shingles vaccine: Zostavax 04/30/11   Shingrix 04/12/17, 08/17/17   Covid-19: 02/13/20, 03/05/20  Advanced directives: no  Conditions/risks identified: none  Next appointment: Follow up in one year for your annual wellness visit. 04/22/23 @ 9 am in person  Preventive Care 65 Years and Older, Male Preventive care refers to lifestyle choices and visits with your health care provider that can promote health and wellness. What does preventive care include? A yearly physical exam. This is also called an annual well check. Dental exams once or twice a year. Routine eye exams. Ask your health care provider how often you should have your eyes checked. Personal lifestyle choices, including: Daily care of your teeth and gums. Regular physical activity. Eating a healthy diet. Avoiding tobacco and drug use. Limiting alcohol use. Practicing safe sex. Taking low doses of aspirin every day. Taking vitamin and mineral supplements as recommended by your health care provider. What happens during an annual well check? The services and screenings done by your health care provider during your annual well check will depend on your age, overall health, lifestyle risk factors, and family history of disease. Counseling  Your health care provider may ask you questions about your: Alcohol use. Tobacco use. Drug use. Emotional well-being. Home and relationship well-being. Sexual  activity. Eating habits. History of falls. Memory and ability to understand (cognition). Work and work Statistician. Screening  You may have the following tests or measurements: Height, weight, and BMI. Blood pressure. Lipid and cholesterol levels. These may be checked every 5 years, or more frequently if you are over 60 years old. Skin check. Lung cancer screening. You may have this screening every year starting at age 110 if you have a 30-pack-year history of smoking and currently smoke or have quit within the past 15 years. Fecal occult blood test (FOBT) of the stool. You may have this test every year starting at age 7. Flexible sigmoidoscopy or colonoscopy. You may have a sigmoidoscopy every 5 years or a colonoscopy every 10 years starting at age 46. Prostate cancer screening. Recommendations will vary depending on your family history and other risks. Hepatitis C blood test. Hepatitis B blood test. Sexually transmitted disease (STD) testing. Diabetes screening. This is done by checking your blood sugar (glucose) after you have not eaten for a while (fasting). You may have this done every 1-3 years. Abdominal aortic aneurysm (AAA) screening. You may need this if you are a current or former smoker. Osteoporosis. You may be screened starting at age 79 if you are at high risk. Talk with your health care provider about your test results, treatment options, and if necessary, the need for more tests. Vaccines  Your health care provider may recommend certain vaccines, such as: Influenza vaccine. This is recommended every year. Tetanus, diphtheria, and acellular pertussis (Tdap, Td) vaccine. You may need a Td booster every 10 years. Zoster vaccine. You may need this after age 41. Pneumococcal 13-valent conjugate (PCV13) vaccine. One dose is recommended after age 28. Pneumococcal polysaccharide (PPSV23) vaccine. One dose is recommended after age 48.  Talk to your health care provider about which  screenings and vaccines you need and how often you need them. This information is not intended to replace advice given to you by your health care provider. Make sure you discuss any questions you have with your health care provider. Document Released: 08/08/2015 Document Revised: 03/31/2016 Document Reviewed: 05/13/2015 Elsevier Interactive Patient Education  2017 [place] Prevention in the Home Falls can cause injuries. They can happen to people of all ages. There are many things you can do to make your home safe and to help prevent falls. What can I do on the outside of my home? Regularly fix the edges of walkways and driveways and fix any cracks. Remove anything that might make you trip as you walk through a door, such as a raised step or threshold. Trim any bushes or trees on the path to your home. Use bright outdoor lighting. Clear any walking paths of anything that might make someone trip, such as rocks or tools. Regularly check to see if handrails are loose or broken. Make sure that both sides of any steps have handrails. Any raised decks and porches should have guardrails on the edges. Have any leaves, snow, or ice cleared regularly. Use sand or salt on walking paths during winter. Clean up any spills in your garage right away. This includes oil or grease spills. What can I do in the bathroom? Use night lights. Install grab bars by the toilet and in the tub and shower. Do not use towel bars as grab bars. Use non-skid mats or decals in the tub or shower. If you need to sit down in the shower, use a plastic, non-slip stool. Keep the floor dry. Clean up any water that spills on the floor as soon as it happens. Remove soap buildup in the tub or shower regularly. Attach bath mats securely with double-sided non-slip rug tape. Do not have throw rugs and other things on the floor that can make you trip. What can I do in the bedroom? Use night lights. Make sure that you have a  light by your bed that is easy to reach. Do not use any sheets or blankets that are too big for your bed. They should not hang down onto the floor. Have a firm chair that has side arms. You can use this for support while you get dressed. Do not have throw rugs and other things on the floor that can make you trip. What can I do in the kitchen? Clean up any spills right away. Avoid walking on wet floors. Keep items that you use a lot in easy-to-reach places. If you need to reach something above you, use a strong step stool that has a grab bar. Keep electrical cords out of the way. Do not use floor polish or wax that makes floors slippery. If you must use wax, use non-skid floor wax. Do not have throw rugs and other things on the floor that can make you trip. What can I do with my stairs? Do not leave any items on the stairs. Make sure that there are handrails on both sides of the stairs and use them. Fix handrails that are broken or loose. Make sure that handrails are as long as the stairways. Check any carpeting to make sure that it is firmly attached to the stairs. Fix any carpet that is loose or worn. Avoid having throw rugs at the top or bottom of the stairs. If you do have throw  rugs, attach them to the floor with carpet tape. Make sure that you have a light switch at the top of the stairs and the bottom of the stairs. If you do not have them, ask someone to add them for you. What else can I do to help prevent falls? Wear shoes that: Do not have high heels. Have rubber bottoms. Are comfortable and fit you well. Are closed at the toe. Do not wear sandals. If you use a stepladder: Make sure that it is fully opened. Do not climb a closed stepladder. Make sure that both sides of the stepladder are locked into place. Ask someone to hold it for you, if possible. Clearly mark and make sure that you can see: Any grab bars or handrails. First and last steps. Where the edge of each step  is. Use tools that help you move around (mobility aids) if they are needed. These include: Canes. Walkers. Scooters. Crutches. Turn on the lights when you go into a dark area. Replace any light bulbs as soon as they burn out. Set up your furniture so you have a clear path. Avoid moving your furniture around. If any of your floors are uneven, fix them. If there are any pets around you, be aware of where they are. Review your medicines with your doctor. Some medicines can make you feel dizzy. This can increase your chance of falling. Ask your doctor what other things that you can do to help prevent falls. This information is not intended to replace advice given to you by your health care provider. Make sure you discuss any questions you have with your health care provider. Document Released: 05/08/2009 Document Revised: 12/18/2015 Document Reviewed: 08/16/2014 Elsevier Interactive Patient Education  2017 [employer].

## 2022-04-16 NOTE — Progress Notes (Signed)
Subjective:   Isaac Weber is a 76 y.o. male who presents for Medicare Annual/Subsequent preventive examination.  Review of Systems     Cardiac Risk Factors include: advanced age (>27mn, >>73women);diabetes mellitus;dyslipidemia;hypertension;male gender     Objective:    Today's Vitals   04/16/22 0852  BP: 122/64  Weight: 161 lb 9.6 oz (73.3 kg)  Height: _0  (1.753 m)   Body mass index is 23.86 kg/m.     04/16/2022    9:00 AM 04/14/2022    1:42 PM 03/24/2022    1:00 PM 02/17/2022   11:03 AM 11/26/2021    1:41 PM 11/25/2021   12:50 PM 04/30/2021    7:05 AM  Advanced Directives  Does Patient Have a Medical Advance Directive? No No No No Yes Yes No  Type of APersonnel officerLiving will    Copy of [place] Chart?     Yes - validated most recent copy scanned in chart (See row information)    Would patient like information on creating a medical advance directive? No - Patient declined No - Patient declined No - Patient declined No - Patient declined       Current Medications (verified) Outpatient Encounter Medications as of 04/16/2022  Medication Sig   albuterol (VENTOLIN HFA) 108 (90 Base) MCG/ACT inhaler Inhale 1-2 puffs into the lungs every 4 (four) hours as needed for wheezing or shortness of breath (cough).   amLODipine (NORVASC) 5 MG tablet TAKE ONE TABLET BY MOUTH ONCE DAILY   baclofen (LIORESAL) 10 MG tablet Take 0.5-1 tablets (5-10 mg total) by mouth 2 (two) times daily as needed for muscle spasms.   Blood Glucose Monitoring Suppl (ONE TOUCH ULTRA 2) w/Device KIT Use to check blood sugar up to 2 times daily   ferrous sulfate 325 (65 FE) MG tablet Take 325 mg by mouth daily with breakfast.   Glucosamine-Chondroitin (GLUCOSAMINE CHONDR COMPLEX PO) Take 1 capsule by mouth 2 (two) times daily.   guaiFENesin (MUCINEX) 600 MG 12 hr tablet Take 1,200 mg by mouth daily.   hydrochlorothiazide (HYDRODIURIL) 12.5 MG tablet  TAKE ONE TABLET BY MOUTH ONCE DAILY   ibuprofen (ADVIL) 800 MG tablet Take 1 tablet (800 mg total) by mouth 3 (three) times daily.   ipratropium (ATROVENT) 0.06 % nasal spray Place 2 sprays into both nostrils 4 (four) times daily. For up to 5-7 days then stop and use as needed.   ketoconazole (NIZORAL) 2 % shampoo SHAMPOO INTO SCALP, LET SET FOR 10 MINUTES THEN WASH OFF, USE 3 TIMES PER WEEK   losartan (COZAAR) 50 MG tablet TAKE ONE TABLET BY MOUTH ONCE DAILY   metFORMIN (GLUCOPHAGE-XR) 500 MG 24 hr tablet TAKE ONE TABLET BY MOUTH EVERYDAY AT BEDTIME   Multiple Minerals-Vitamins (CAL MAG ZINC +D3 PO) Take 1 tablet by mouth every evening.   Multiple Vitamin (MULTIVITAMIN WITH MINERALS) TABS tablet Take 1 tablet by mouth daily.   ONETOUCH ULTRA test strip Use to check blood sugar up to twice per day   potassium chloride (KLOR-CON M) 10 MEQ tablet Take 1 tablet (10 mEq total) by mouth daily.   rosuvastatin (CRESTOR) 5 MG tablet TAKE ONE TABLET BY MOUTH ONCE DAILY   tamsulosin (FLOMAX) 0.4 MG CAPS capsule Take 1 capsule (0.4 mg total) by mouth daily.   azithromycin (ZITHROMAX Z-PAK) 250 MG tablet Take 2 tabs (5045mtotal) on Day 1. Take 1 tab (25052mdaily for next  4 days. (Patient not taking: Reported on 04/16/2022)   predniSONE (DELTASONE) 20 MG tablet Take 2 tablets (40 mg total) by mouth daily with breakfast. (Patient not taking: Reported on 04/14/2022)   sodium chloride HYPERTONIC 3 % nebulizer solution Take by nebulization as needed for other. (Patient not taking: Reported on 04/16/2022)   No facility-administered encounter medications on file as of 04/16/2022.    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 WD SCC, ulcerated. [facility] 04/29/2020   Past Surgical History:  Procedure Laterality Date   BACK SURGERY     BRONCHIAL WASHINGS  11/26/2021   Procedure: BRONCHIAL WASHINGS;  Surgeon: Brand Males, MD;  Location: [facility] ENDOSCOPY;  Service: Endoscopy;;   BUNIONECTOMY Left 2003   hammer toe as well   BUNIONECTOMY WITH HAMMERTOE RECONSTRUCTION Left 2013   repeat   CERVICAL DISC SURGERY     COLONOSCOPY WITH PROPOFOL N/A 04/30/2021   Procedure: COLONOSCOPY WITH PROPOFOL;  Surgeon: Jonathon Bellows, MD;  Location[facility] ENDOSCOPY;  Service: Gastroenterology;  Laterality: N/A;   GREEN LIGHT LASER TURP (TRANSURETHRAL RESECTION OF PROSTATE  2010   laser, shrink prostate   KNEE ARTHROSCOPY Right 1991   LEG SURGERY Left    BENIGN BONE TUMOR   LUMBAR [place] SURGERY  2010   discectomy   LUMBAR DISC SURGERY  2011   PROSTATE SURGERY  2002   shrink prostate   ROTATOR CUFF REPAIR Left 1999   debride, remove bonespur   ROTATOR CUFF REPAIR Right 1996   VIDEO BRONCHOSCOPY N/A 11/26/2021   Procedure: VIDEO BRONCHOSCOPY WITHOUT FLUORO;  Surgeon: Brand Males, MD;  Location: [facility] ENDOSCOPY;  Service: Endoscopy;  Laterality: N/A;  for chroni ccough BAL   Family History  Problem Relation Age of Onset   Heart disease Mother 58   Emphysema Father 76   Heart disease Brother 23   Social History   Socioeconomic History   Marital status: Married    Spouse name: Sujay Grundman   Number of children: Not on file   Years of education: 12   Highest  education level: High school graduate  Occupational History   Occupation: retired  Tobacco Use   Smoking status: Former    Packs/day: 3.00    Years: 26.00    Total pack years: 78.00    Types: Cigarettes    Quit date: 09/17/1971    Years since quitting: 50.6    Passive exposure: Past   Smokeless tobacco: Never  Vaping Use   Vaping Use: Never used  Substance and Sexual Activity   Alcohol use: Yes    Alcohol/week: 2.0 standard drinks of alcohol  Types: 2 Standard drinks or equivalent per week   Drug use: Never   Sexual activity: Yes    Partners: Female  Other Topics Concern   Not on file  Social History Narrative   Not on file   Social Determinants of Health   Financial Resource Strain: Low Risk  (04/16/2022)   Overall Financial Resource Strain (CARDIA)    Difficulty of Paying Living Expenses: Not hard at all  Food Insecurity: No Food Insecurity (04/16/2022)   Hunger Vital Sign    Worried About Running Out of Food in the Last Year: Never true    Ran Out of Food in the Last Year: Never true  Transportation Needs: No Transportation Needs (04/16/2022)   PRAPARE - Hydrologist (Medical): No    Lack of Transportation (Non-Medical): No  Physical Activity: Sufficiently Active (04/16/2022)   Exercise Vital Sign    Days of Exercise per Week: 5 days    Minutes of Exercise per Session: 30 min  Stress: No Stress Concern Present (04/16/2022)   [place]    Feeling of Stress : Not at all  Social Connections: Moderately Integrated (04/16/2022)   Social Connection and Isolation Panel [NHANES]    Frequency of Communication with Friends and Family: More than three times a week    Frequency of Social Gatherings with Friends and Family: More than three times a week    Attends Religious Services: More than 4 times per year    Active Member of [employer] or Organizations: No    Attends Programme researcher, broadcasting/film/video: Never    Marital Status: Married    Tobacco Counseling Counseling given: Not Answered   Clinical Intake:  Pre-visit preparation completed: Yes  Pain : No/denies pain     Nutritional Risks: None Diabetes: Yes CBG done?: No Did pt. bring in CBG monitor from home?: No  How often do you need to have someone help you when you read instructions, pamphlets, or other written materials from your doctor or pharmacy?: 2 - Rarely  Diabetic?yes Nutrition Risk Assessment:  Has the patient had any N/V/D within the last 2 months?  No  Does the patient have any non-healing wounds?  No  Has the patient had any unintentional weight loss or weight gain?  No   Diabetes:  Is the patient diabetic?  Yes  If diabetic, was a CBG obtained today?  No  Did the patient bring in their glucometer from home?  No  How often do you monitor your CBG's? Once per day.   Financial Strains and Diabetes Management:  Are you having any financial strains with the device, your supplies or your medication? No .  Does the patient want to be seen by Chronic Care Management for management of their diabetes?  No  Would the patient like to be referred to a Nutritionist or for Diabetic Management?  No   Diabetic Exams:  Diabetic Eye Exam: Completed 10/30/20. Overdue for diabetic eye exam. Pt has been advised about the importance in completing this exam.  Diabetic Foot Exam: Completed 06/22/21. Pt has been advised about the importance in completing this exam.   Interpreter Needed?: No  Information entered by :: Kirke Shaggy, LPN   Activities of Daily Living    04/16/2022    9:01 AM 10/05/2021   11:05 AM  In your present state of health, do you have any difficulty performing the following activities:  Hearing? 0  0  Vision? 0 0  Difficulty concentrating or making decisions? 0 0  Walking or climbing stairs? 1 1  Dressing or bathing? 0 0  Doing errands, shopping? 0 0  Preparing Food and  eating ? N   Using the Toilet? N   In the past six months, have you accidently leaked urine? N   Do you have problems with loss of bowel control? N   Managing your Medications? N   Managing your Finances? N   Housekeeping or managing your Housekeeping? N     Patient Care Team: Olin Hauser, DO as PCP - General (Family Medicine) Jettie Booze, MD as PCP - Cardiology (Cardiology)  Indicate any recent Medical Services you may have received from other than [facility] providers in the past year (date may be approximate).     Assessment:   This is a routine wellness examination for Isaac Weber.  Hearing/Vision screen Hearing Screening - Comments:: No aids Vision Screening - Comments:: Wears glasses- Dr. Sherral Hammers in [place]  Dietary issues and exercise activities discussed: Current Exercise Habits: Home exercise routine, Type of exercise: walking, Time (Minutes): 30, Frequency (Times/Week): 5, Weekly Exercise (Minutes/Week): 150, Intensity: Mild   Goals Addressed             This Visit's Progress    DIET - EAT MORE FRUITS AND VEGETABLES         Depression Screen    04/16/2022    8:59 AM 04/14/2022    1:41 PM 03/24/2022    1:00 PM 03/02/2022    1:36 PM 02/17/2022   11:02 AM 01/28/2022    9:30 AM 12/29/2021   10:17 AM  PHQ 2/9 Scores  PHQ - 2 Score 0 0 0 0 0 0 0  PHQ- 9 Score 0   0  0 0    Fall Risk    04/16/2022    9:01 AM 04/14/2022    1:41 PM 03/24/2022    1:00 PM 03/02/2022    1:36 PM 02/17/2022   11:02 AM  [place] in the past year? 0 0 0 0 0  Number falls in past yr: 0      Injury with Fall? 0   0   Risk for fall due to : No Fall Risks      Follow up Falls prevention discussed;Falls evaluation completed        FALL RISK PREVENTION PERTAINING TO THE HOME:  Any stairs in or around the home? No  If so, are there any without handrails? No  Home free of loose throw rugs in walkways, pet beds, electrical cords, etc? Yes  Adequate lighting in your home to  reduce risk of falls? Yes   ASSISTIVE DEVICES UTILIZED TO PREVENT FALLS:  Life alert? No  Use of a cane, walker or w/c? Yes  Grab bars in the bathroom? Yes  Shower chair or bench in shower? Yes  Elevated toilet seat or a handicapped toilet? Yes   TIMED UP AND GO:  Was the test performed? Yes .  Length of time to ambulate 10 feet: 4 sec.   Gait steady and fast without use of assistive device  Cognitive Function:        04/16/2022    9:09 AM 04/07/2021    2:09 PM 03/18/2020    2:05 PM  6CIT Screen  What Year? 0 points 0 points 0 points  What month? 0 points 0 points 0 points  What time? 0 points 0  points 0 points  Count back from 20 0 points 0 points 0 points  Months in reverse 0 points 0 points 0 points  Repeat phrase 0 points 4 points 4 points  Total Score 0 points 4 points 4 points    Immunizations Immunization History  Administered Date(s) Administered   Fluad Quad(high Dose 65+) 05/13/2019, 06/09/2020, 04/21/2021   Influenza, High Dose Seasonal PF 07/15/2017, 04/12/2018   PFIZER(Purple Top)SARS-COV-2 Vaccination 02/13/2020, 03/05/2020   Pneumococcal Conjugate-13 01/28/2014   Pneumococcal Polysaccharide-23 03/04/2008, 08/26/2020   Tdap 07/12/2011   Zoster Recombinat (Shingrix) 04/12/2017, 08/17/2017    TDAP status: Due, Education has been provided regarding the importance of this vaccine. Advised may receive this vaccine at local pharmacy or Health Dept. Aware to provide a copy of the vaccination record if obtained from local pharmacy or Health Dept. Verbalized acceptance and understanding.  Flu Vaccine status: Up to date  Pneumococcal vaccine status: Up to date  Covid-19 vaccine status: Completed vaccines  Qualifies for Shingles Vaccine? Yes   Zostavax completed Yes   Shingrix Completed?: Yes  Screening Tests Health Maintenance  Topic Date Due   COVID-19 Vaccine (3 - Pfizer risk series) 04/02/2020   Diabetic kidney evaluation - Urine ACR  11/11/2020    TETANUS/TDAP  07/11/2021   OPHTHALMOLOGY EXAM  10/30/2021   INFLUENZA VACCINE  02/23/2022   Hepatitis C Screening  06/09/2024 (Originally 02/23/1964)   HEMOGLOBIN A1C  06/09/2022   FOOT EXAM  06/22/2022   Diabetic kidney evaluation - GFR measurement  01/29/2023   COLONOSCOPY (Pts 45-30yr Insurance coverage will need to be confirmed)  04/30/2024   Pneumonia Vaccine 76 Years old  Completed   Zoster Vaccines- Shingrix  Completed   HPV VACCINES  Aged Out    Health Maintenance  Health Maintenance Due  Topic Date Due   COVID-19 Vaccine (3 - Pfizer risk series) 04/02/2020   Diabetic kidney evaluation - Urine ACR  11/11/2020   TETANUS/TDAP  07/11/2021   OPHTHALMOLOGY EXAM  10/30/2021   INFLUENZA VACCINE  02/23/2022    Colorectal cancer screening: Type of screening: Colonoscopy. Completed 04/30/21. Repeat every 3 years  Lung Cancer Screening: (Low Dose CT Chest recommended if Age 77-80years, 30 pack-year currently smoking OR have quit w/in 15years.) does not qualify.   Additional Screening:  Hepatitis C Screening: does qualify; Completed no  Vision Screening: Recommended annual ophthalmology exams for early detection of glaucoma and other disorders of the eye. Is the patient up to date with their annual eye exam?  Yes  Who is the provider or what is the name of the office in which the patient attends annual eye exams[place] is not established with a provider, would they like to be referred to a provider to establish care? No .   Dental Screening: Recommended annual dental exams for proper oral hygiene  Community Resource Referral / Chronic Care Management: CRR required this visit?  No   CCM required this visit?  No      Plan:     I have personally reviewed and noted the following in the patient's chart:   Medical and social history Use of alcohol, tobacco or illicit drugs  Current medications and supplements including opioid prescriptions. Patient is not  currently taking opioid prescriptions. Functional ability and status Nutritional status Physical activity Advanced directives List of other physicians Hospitalizations, surgeries, and ER visits in previous 12 months Vitals Screenings to include cognitive, depression, and falls Referrals and appointments  In addition,  I have reviewed and discussed with patient certain preventive protocols, quality metrics, and best practice recommendations. A written personalized care plan for preventive services as well as general preventive health recommendations were provided to patient.     Dionisio David, LPN   04/08/7828   Nurse Notes: none

## 2022-04-21 ENCOUNTER — Encounter: Payer: Self-pay | Admitting: Internal Medicine

## 2022-04-21 NOTE — H&P (View-Only) (Signed)
Referring Physician:  Gillis Santa, [address],  [place] [address]  Primary Physician:  Olin Hauser, DO  History of Present Illness: 04/22/2022 [name] is here today with a chief complaint of low back pain and right leg pain that goes into the shin. Also complains of weakness in the leg.  He has been having symptoms since February 2023.  He underwent 2 months of physical therapy without improvement.  He has also had injections which have not fix the issue.  He describes shooting pain down his right leg when he stands, walks, lifts, or bends.  He also has back pain that extends on both sides of his back.  His pain is as bad as 5 out of 10 and is made better with sitting.   Bowel/Bladder Dysfunction: none  Conservative measures:  Physical therapy: has participated in at [facility] from 10/22/21 to 12/22/21 and also doing some home exercises without relief. Multimodal medical therapy including regular antiinflammatories: ibuprofen, gabapentin, baclofen, prednisone Injections: has received epidural steroid injections 03/24/22: Right L4-5 IL ESI (Dr. Holley Raring) 02/17/22: Right L4-5 IL ESI (Dr. Holley Raring) 11/25/21: Right L4-5 TF ESI (Dr. Holley Raring)  Past Surgery:  1 neck surgery by Dr. Kristeen Miss 2 back surgery by Dr. Kristeen Miss in 2010 and 2011  Isaac Weber has no symptoms of cervical myelopathy.  The symptoms are causing a significant impact on the patient's life.   Review of Systems:  A 10 point review of systems is negative, except for the pertinent positives and negatives detailed in the HPI.  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 WD SCC, ulcerated. [facility] 04/29/2020    Past Surgical History: Past Surgical History:  Procedure Laterality Date   BACK SURGERY     BRONCHIAL WASHINGS  11/26/2021   Procedure: BRONCHIAL WASHINGS;  Surgeon: Brand Males, MD;  Location: [facility] ENDOSCOPY;  Service: Endoscopy;;   BUNIONECTOMY Left 2003   hammer toe as well   BUNIONECTOMY WITH HAMMERTOE RECONSTRUCTION Left 2013   repeat   CERVICAL DISC SURGERY     COLONOSCOPY WITH PROPOFOL N/A 04/30/2021   Procedure: COLONOSCOPY WITH PROPOFOL;  Surgeon: Jonathon Bellows, MD;  Location: [facility] ENDOSCOPY;  Service: Gastroenterology;  Laterality: N/A;   GREEN LIGHT LASER TURP (TRANSURETHRAL RESECTION OF PROSTATE  2010   laser, shrink prostate   KNEE ARTHROSCOPY Right 1991   LEG SURGERY Left    BENIGN BONE TUMOR   LUMBAR [place] SURGERY  2010   discectomy   LUMBAR [place] SURGERY  2011   PROSTATE SURGERY  2002   shrink prostate   ROTATOR CUFF REPAIR Left 1999   debride,  remove bonespur   ROTATOR CUFF REPAIR Right 1996   VIDEO BRONCHOSCOPY N/A 11/26/2021   Procedure: VIDEO BRONCHOSCOPY WITHOUT FLUORO;  Surgeon: Brand Males, MD;  Location: [facility] ENDOSCOPY;  Service: Endoscopy;  Laterality: N/A;  for chroni ccough BAL    Allergies: Allergies as of 04/22/2022 - Review Complete 04/22/2022  Allergen Reaction Noted   Morphine and related Diarrhea and Nausea And Vomiting 04/05/2014    Medications: Current Meds  Medication Sig   albuterol (VENTOLIN HFA) 108 (90 Base) MCG/ACT inhaler Inhale 1-2 puffs into the lungs every 4 (four) hours as needed  for wheezing or shortness of breath (cough).   amLODipine (NORVASC) 5 MG tablet TAKE ONE TABLET BY MOUTH ONCE DAILY   baclofen (LIORESAL) 10 MG tablet Take 0.5-1 tablets (5-10 mg total) by mouth 2 (two) times daily as needed for muscle spasms.   Blood Glucose Monitoring Suppl (ONE TOUCH ULTRA 2) w/Device KIT Use to check blood sugar up to 2 times daily   ferrous sulfate 325 (65 FE) MG tablet Take 325 mg by mouth daily with breakfast.   Glucosamine-Chondroitin (GLUCOSAMINE CHONDR COMPLEX PO) Take 1 capsule by mouth 2 (two) times daily.   guaiFENesin (MUCINEX) 600 MG 12 hr tablet Take 1,200 mg by mouth daily.   hydrochlorothiazide (HYDRODIURIL) 12.5 MG tablet TAKE ONE TABLET BY MOUTH ONCE DAILY   ibuprofen (ADVIL) 800 MG tablet Take 1 tablet (800 mg total) by mouth 3 (three) times daily.   ipratropium (ATROVENT) 0.06 % nasal spray Place 2 sprays into both nostrils 4 (four) times daily. For up to 5-7 days then stop and use as needed.   ketoconazole (NIZORAL) 2 % shampoo SHAMPOO INTO SCALP, LET SET FOR 10 MINUTES THEN WASH OFF, USE 3 TIMES PER WEEK   losartan (COZAAR) 50 MG tablet TAKE ONE TABLET BY MOUTH ONCE DAILY   metFORMIN (GLUCOPHAGE-XR) 500 MG 24 hr tablet TAKE ONE TABLET BY MOUTH EVERYDAY AT BEDTIME   Multiple Minerals-Vitamins (CAL MAG ZINC +D3 PO) Take 1 tablet by mouth every evening.   Multiple Vitamin (MULTIVITAMIN WITH MINERALS) TABS tablet Take 1 tablet by mouth daily.   ONETOUCH ULTRA test strip Use to check blood sugar up to twice per day   potassium chloride (KLOR-CON M) 10 MEQ tablet Take 1 tablet (10 mEq total) by mouth daily.   rosuvastatin (CRESTOR) 5 MG tablet TAKE ONE TABLET BY MOUTH ONCE DAILY   tamsulosin (FLOMAX) 0.4 MG CAPS capsule Take 1 capsule (0.4 mg total) by mouth daily.    Social History: Social History   Tobacco Use   Smoking status: Former    Packs/day: 3.00    Years: 26.00    Total pack years: 78.00    Types: Cigarettes    Quit date: 09/17/1971    Years  since quitting: 50.6    Passive exposure: Past   Smokeless tobacco: Never  Vaping Use   Vaping Use: Never used  Substance Use Topics   Alcohol use: Yes    Alcohol/week: 2.0 standard drinks of alcohol    Types: 2 Standard drinks or equivalent per week   Drug use: Never    Family Medical History: Family History  Problem Relation Age of Onset   Heart disease Mother 27   Emphysema Father 90   Heart disease Brother 38    Physical Examination: Vitals:   04/22/22 1405  BP: 138/72    General: Patient is well developed, well nourished, calm, collected, and in no apparent distress. Attention to examination is appropriate.  Neck:   Supple.  Full range of motion.  Respiratory: Patient is breathing without any difficulty.   NEUROLOGICAL:     Awake, alert, oriented to person, place, and time.  Speech is clear and fluent. Fund of knowledge is appropriate.   Cranial Nerves: Pupils equal round and reactive to light.  Facial tone is symmetric.  Facial sensation is symmetric. Shoulder shrug is symmetric. Tongue protrusion is midline.  There is no pronator drift.  ROM of spine: full.    Strength: Side Biceps Triceps Deltoid Interossei Grip Wrist Ext. Wrist Flex.  R _0 L _1 Side Iliopsoas Quads Hamstring PF DF EHL  R _2 L _3 Reflexes are 1+ and symmetric at the biceps, triceps, brachioradialis, patella and achilles.   Hoffman's is absent.   Bilateral upper and lower extremity sensation is intact to light touch.    No evidence of dysmetria noted.  Gait is antalgic due to pain.     Medical Decision Making  Imaging: MRI C spine 03/20/22 IMPRESSION: 1. Solid anterior fusion at C5-6 and C6-7 without residual stenosis. 2. Severe upper cervical facet arthrosis with mild spinal stenosis and moderate neural foraminal stenosis at C3-4 and C4-5. 3. Mild-to-moderate right neural foraminal stenosis at C7-T1.     Electronically Signed    By: Logan Bores M.D.   On: 03/20/2022 11:50  MRI L spine 11/05/21 IMPRESSION: 1. Grade 1 anterolisthesis of L4 on L5 with associated advanced facet arthropathy resulting in severe spinal canal stenosis with impingement of the cauda equina nerve roots and moderate to severe left and severe right neural foraminal stenosis, progressed since 2010. 2. Severe left and moderate right neural foraminal stenosis at L2-L3, progressed since 2010. 3. Disc bulge at L5-S1 with possible impingement of the traversing right S1 nerve root. There is also narrowing of the right subarticular zone at L1-L2 with potential irritation of the traversing L2 nerve root. 4. Grade 1 retrolisthesis of L1 on L2, L2 on L3, and L5 on S1, similar to 2010. There is mild levocurvature centered at L2.     Electronically Signed   By: Valetta Mole M.D.   On: 11/05/2021 16:49   I have personally reviewed the images and agree with the above interpretation.  Assessment and Plan: Mr. Higashi is a pleasant 76 y.o. 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  He also has symptoms of a right-sided S1 radiculopathy due to disc herniation and facet arthrosis narrowing the right lateral recess at L5-S1.  He has back pain with sciatica on the right side.  He has tried and failed conservative management including physical therapy, medications, and injections.  At this point no further conservative management is indicated.  I have recommended a right-sided L4-5 minimally invasive transforaminal lumbar interbody fusion as well as a right-sided L5-S1 decompression and discectomy.  He will need clearance by his primary care doctor Dr. Parks Ranger and his pulmonologist Dr. Chase Caller.  I discussed the planned procedure at length with the patient, including the risks, benefits, alternatives, and indications. The risks discussed include but are not limited to bleeding, infection, need for  reoperation, spinal fluid leak, stroke, vision loss, anesthetic complication, coma, paralysis, and even death. I also described in detail that improvement was not guaranteed.  The patient expressed understanding of these risks, and  asked that we proceed with surgery. I described the surgery in layman's terms, and gave ample opportunity for questions, which were answered to the best of my ability.    I spent a total of 30 minutes in face-to-face and non-face-to-face activities related to this patient's care today.  Thank you for involving me in the care of this patient.      Serenity Batley K. Izora Ribas MD, [facility] Neurosurgery

## 2022-04-21 NOTE — Progress Notes (Unsigned)
Referring Physician:  Gillis Santa, [address],  [place] [address]  Primary Physician:  Olin Hauser, DO  History of Present Illness: 04/22/2022 [name] is here today with a chief complaint of low back pain and right leg pain that goes into the shin. Also complains of weakness in the leg.  He has been having symptoms since February 2023.  He underwent 2 months of physical therapy without improvement.  He has also had injections which have not fix the issue.  He describes shooting pain down his right leg when he stands, walks, lifts, or bends.  He also has back pain that extends on both sides of his back.  His pain is as bad as 5 out of 10 and is made better with sitting.   Bowel/Bladder Dysfunction: none  Conservative measures:  Physical therapy: has participated in at [facility] from 10/22/21 to 12/22/21 and also doing some home exercises without relief. Multimodal medical therapy including regular antiinflammatories: ibuprofen, gabapentin, baclofen, prednisone Injections: has received epidural steroid injections 03/24/22: Right L4-5 IL ESI (Dr. Holley Raring) 02/17/22: Right L4-5 IL ESI (Dr. Holley Raring) 11/25/21: Right L4-5 TF ESI (Dr. Holley Raring)  Past Surgery:  1 neck surgery by Dr. Kristeen Miss 2 back surgery by Dr. Kristeen Miss in 2010 and 2011  Isaac Weber has no symptoms of cervical myelopathy.  The symptoms are causing a significant impact on the patient's life.   Review of Systems:  A 10 point review of systems is negative, except for the pertinent positives and negatives detailed in the HPI.  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 WD SCC, ulcerated. [facility] 04/29/2020    Past Surgical History: Past Surgical History:  Procedure Laterality Date   BACK SURGERY     BRONCHIAL WASHINGS  11/26/2021   Procedure: BRONCHIAL WASHINGS;  Surgeon: Brand Males, MD;  [facility]: [facility] ENDOSCOPY;  Service: Endoscopy;;   BUNIONECTOMY Left 2003   hammer toe as well   BUNIONECTOMY WITH HAMMERTOE RECONSTRUCTION Left 2013   repeat   CERVICAL DISC SURGERY     COLONOSCOPY WITH PROPOFOL N/A 04/30/2021   Procedure: COLONOSCOPY WITH PROPOFOL;  Surgeon: Jonathon Bellows, MD;  Location: [facility] ENDOSCOPY;  Service: Gastroenterology;  Laterality: N/A;   GREEN LIGHT LASER TURP (TRANSURETHRAL RESECTION OF PROSTATE  2010   laser, shrink prostate   KNEE ARTHROSCOPY Right 1991   LEG SURGERY Left    BENIGN BONE TUMOR   LUMBAR [place] SURGERY  2010   discectomy   LUMBAR [place] SURGERY  2011   PROSTATE SURGERY  2002   shrink prostate   ROTATOR CUFF REPAIR Left 1999   debride,  remove bonespur   ROTATOR CUFF REPAIR Right 1996   VIDEO BRONCHOSCOPY N/A 11/26/2021   Procedure: VIDEO BRONCHOSCOPY WITHOUT FLUORO;  Surgeon: Brand Males, MD;  Location: [facility] ENDOSCOPY;  Service: Endoscopy;  Laterality: N/A;  for chroni ccough BAL    Allergies: Allergies as of 04/22/2022 - Review Complete 04/22/2022  Allergen Reaction Noted   Morphine and related Diarrhea and Nausea And Vomiting 04/05/2014    Medications: Current Meds  Medication Sig   albuterol (VENTOLIN HFA) 108 (90 Base) MCG/ACT inhaler Inhale 1-2 puffs into the lungs every 4 (four) hours as needed  for wheezing or shortness of breath (cough).   amLODipine (NORVASC) 5 MG tablet TAKE ONE TABLET BY MOUTH ONCE DAILY   baclofen (LIORESAL) 10 MG tablet Take 0.5-1 tablets (5-10 mg total) by mouth 2 (two) times daily as needed for muscle spasms.   Blood Glucose Monitoring Suppl (ONE TOUCH ULTRA 2) w/Device KIT Use to check blood sugar up to 2 times daily   ferrous sulfate 325 (65 FE) MG tablet Take 325 mg by mouth daily with breakfast.   Glucosamine-Chondroitin (GLUCOSAMINE CHONDR COMPLEX PO) Take 1 capsule by mouth 2 (two) times daily.   guaiFENesin (MUCINEX) 600 MG 12 hr tablet Take 1,200 mg by mouth daily.   hydrochlorothiazide (HYDRODIURIL) 12.5 MG tablet TAKE ONE TABLET BY MOUTH ONCE DAILY   ibuprofen (ADVIL) 800 MG tablet Take 1 tablet (800 mg total) by mouth 3 (three) times daily.   ipratropium (ATROVENT) 0.06 % nasal spray Place 2 sprays into both nostrils 4 (four) times daily. For up to 5-7 days then stop and use as needed.   ketoconazole (NIZORAL) 2 % shampoo SHAMPOO INTO SCALP, LET SET FOR 10 MINUTES THEN WASH OFF, USE 3 TIMES PER WEEK   losartan (COZAAR) 50 MG tablet TAKE ONE TABLET BY MOUTH ONCE DAILY   metFORMIN (GLUCOPHAGE-XR) 500 MG 24 hr tablet TAKE ONE TABLET BY MOUTH EVERYDAY AT BEDTIME   Multiple Minerals-Vitamins (CAL MAG ZINC +D3 PO) Take 1 tablet by mouth every evening.   Multiple Vitamin (MULTIVITAMIN WITH MINERALS) TABS tablet Take 1 tablet by mouth daily.   ONETOUCH ULTRA test strip Use to check blood sugar up to twice per day   potassium chloride (KLOR-CON M) 10 MEQ tablet Take 1 tablet (10 mEq total) by mouth daily.   rosuvastatin (CRESTOR) 5 MG tablet TAKE ONE TABLET BY MOUTH ONCE DAILY   tamsulosin (FLOMAX) 0.4 MG CAPS capsule Take 1 capsule (0.4 mg total) by mouth daily.    Social History: Social History   Tobacco Use   Smoking status: Former    Packs/day: 3.00    Years: 26.00    Total pack years: 78.00    Types: Cigarettes    Quit date: 09/17/1971    Years  since quitting: 50.6    Passive exposure: Past   Smokeless tobacco: Never  Vaping Use   Vaping Use: Never used  Substance Use Topics   Alcohol use: Yes    Alcohol/week: 2.0 standard drinks of alcohol    Types: 2 Standard drinks or equivalent per week   Drug use: Never    Family Medical History: Family History  Problem Relation Age of Onset   Heart disease Mother 68   Emphysema Father 73   Heart disease Brother 32    Physical Examination: Vitals:   04/22/22 1405  BP: 138/72    General: Patient is well developed, well nourished, calm, collected, and in no apparent distress. Attention to examination is appropriate.  Neck:   Supple.  Full range of motion.  Respiratory: Patient is breathing without any difficulty.   NEUROLOGICAL:     Awake, alert, oriented to person, place, and time.  Speech is clear and fluent. Fund of knowledge is appropriate.   Cranial Nerves: Pupils equal round and reactive to light.  Facial tone is symmetric.  Facial sensation is symmetric. Shoulder shrug is symmetric. Tongue protrusion is midline.  There is no pronator drift.  ROM of spine: full.    Strength: Side Biceps Triceps Deltoid Interossei Grip Wrist Ext. Wrist Flex.  R _0 L _1 Side Iliopsoas Quads Hamstring PF DF EHL  R _2 L _3 Reflexes are 1+ and symmetric at the biceps, triceps, brachioradialis, patella and achilles.   Hoffman's is absent.   Bilateral upper and lower extremity sensation is intact to light touch.    No evidence of dysmetria noted.  Gait is antalgic due to pain.     Medical Decision Making  Imaging: MRI C spine 03/20/22 IMPRESSION: 1. Solid anterior fusion at C5-6 and C6-7 without residual stenosis. 2. Severe upper cervical facet arthrosis with mild spinal stenosis and moderate neural foraminal stenosis at C3-4 and C4-5. 3. Mild-to-moderate right neural foraminal stenosis at C7-T1.     Electronically Signed    By: Logan Bores M.D.   On: 03/20/2022 11:50  MRI L spine 11/05/21 IMPRESSION: 1. Grade 1 anterolisthesis of L4 on L5 with associated advanced facet arthropathy resulting in severe spinal canal stenosis with impingement of the cauda equina nerve roots and moderate to severe left and severe right neural foraminal stenosis, progressed since 2010. 2. Severe left and moderate right neural foraminal stenosis at L2-L3, progressed since 2010. 3. Disc bulge at L5-S1 with possible impingement of the traversing right S1 nerve root. There is also narrowing of the right subarticular zone at L1-L2 with potential irritation of the traversing L2 nerve root. 4. Grade 1 retrolisthesis of L1 on L2, L2 on L3, and L5 on S1, similar to 2010. There is mild levocurvature centered at L2.     Electronically Signed   By: Valetta Mole M.D.   On: 11/05/2021 16:49   I have personally reviewed the images and agree with the above interpretation.  Assessment and Plan: Isaac Weber is a pleasant 76 y.o. 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  He also has symptoms of a right-sided S1 radiculopathy due to disc herniation and facet arthrosis narrowing the right lateral recess at L5-S1.  He has back pain with sciatica on the right side.  He has tried and failed conservative management including physical therapy, medications, and injections.  At this point no further conservative management is indicated.  I have recommended a right-sided L4-5 minimally invasive transforaminal lumbar interbody fusion as well as a right-sided L5-S1 decompression and discectomy.  He will need clearance by his primary care doctor Dr. Parks Ranger and his pulmonologist Dr. Chase Caller.  I discussed the planned procedure at length with the patient, including the risks, benefits, alternatives, and indications. The risks discussed include but are not limited to bleeding, infection, need for  reoperation, spinal fluid leak, stroke, vision loss, anesthetic complication, coma, paralysis, and even death. I also described in detail that improvement was not guaranteed.  The patient expressed understanding of these risks, and  asked that we proceed with surgery. I described the surgery in layman's terms, and gave ample opportunity for questions, which were answered to the best of my ability.    I spent a total of 30 minutes in face-to-face and non-face-to-face activities related to this patient's care today.  Thank you for involving me in the care of this patient.      Tamecka Milham K. Izora Ribas MD, [facility] Neurosurgery

## 2022-04-22 ENCOUNTER — Ambulatory Visit (INDEPENDENT_AMBULATORY_CARE_PROVIDER_SITE_OTHER): Payer: PPO

## 2022-04-22 ENCOUNTER — Encounter: Payer: Self-pay | Admitting: Neurosurgery

## 2022-04-22 ENCOUNTER — Other Ambulatory Visit: Payer: Self-pay

## 2022-04-22 ENCOUNTER — Ambulatory Visit: Payer: PPO | Admitting: Neurosurgery

## 2022-04-22 VITALS — BP 138/72 | Ht 69.0 in | Wt 161.4 lb

## 2022-04-22 DIAGNOSIS — G8929 Other chronic pain: Secondary | ICD-10-CM | POA: Diagnosis not present

## 2022-04-22 DIAGNOSIS — Z01818 Encounter for other preprocedural examination: Secondary | ICD-10-CM

## 2022-04-22 DIAGNOSIS — Z23 Encounter for immunization: Secondary | ICD-10-CM

## 2022-04-22 DIAGNOSIS — M4316 Spondylolisthesis, lumbar region: Secondary | ICD-10-CM | POA: Diagnosis not present

## 2022-04-22 DIAGNOSIS — M5441 Lumbago with sciatica, right side: Secondary | ICD-10-CM | POA: Diagnosis not present

## 2022-04-22 DIAGNOSIS — M5416 Radiculopathy, lumbar region: Secondary | ICD-10-CM | POA: Diagnosis not present

## 2022-04-22 NOTE — Patient Instructions (Signed)
Please see below for information in regards to your upcoming surgery:  Planned surgery: Right L4-5 minimally invasive (MIS) transforaminal lumbar interbody fusion (TLIF), Right L5-S1 discectomy   Surgery date: 05/12/22 - you will find out your arrival time the business day before your surgery.  Metformin - should be held for 2 days prior to surgery   NSAIDS (Non-steroidal anti-inflammatory drugs): because you are having a fusion, no NSAIDS (such as ibuprofen, aleve, naproxen, meloxicam, diclofenac) for 3 months after surgery. Celebrex is an exception. Tylenol is ok because it is not an NSAID.   Surgical clearance: we will send a clearance request to pulmonology and your primary care provider   Pre-op appointment at [facility]: we will call you with a date/time for this. Pre-admit testing is located on the first floor of the [facility], [place], [address].   Pre-op labs may be done at your pre-op appointment. You are not required to fast for these labs.    Should you need to change your pre-op appointment, please call Pre-admit testing at [phone].     Home health physical therapy: [facility] [place] will contact you regarding home health physical therapy for after surgery.Their number is [phone].   Because you are having a fusion: for appointments after your 2 week follow-up: please arrive at the [facility] outpatient imaging center ([place], [place]) or [facility] one hour prior to your appointment for x-rays. This applies to every appointment after your 2 week follow-up. Failure to do so may result in your appointment being rescheduled.   If you have FMLA/disability paperwork, please drop it off or fax it to [phone], attention [place].   If you have any questions/concerns before or after surgery, you can reach [place] at [phone], or you can send a  mychart message. If you have a concern after hours that cannot wait until normal business hours, you can call [phone] or [phone] and ask the answering service to page the neurosurgeon on call.    Appointments/FMLA & disability paperwork: [place] Nurse: Ophelia Shoulder  Medical assistant: Raquel Sarna Physician Assistant's: Cooper Render & Geronimo Boot Surgeon: Meade Maw, MD

## 2022-04-22 NOTE — Telephone Encounter (Signed)
Please see mychart message sent by pt: [name] "Mike"  [facility] (supporting Brand Males, MD) Yesterday (2:26 PM)    Doctor I assume I need to move up my appointment with you. I'm scheduled for well I don't know your calendar hadn't opened up yet. The week of 8/28 I started sinus drainage tons of drainage so I went to my Primary care he ordered Ipratropium well that stopped the drainage but I couldn't breathe,  I took recommend dose and quit. Around 9/7 cough has returned PC put me on 20 mg Prednisone, it did nothing. I contacted Doctor Ravishankar she asked PC to order a Sputum test which he did I can not preform that test on an empty stomach it just doesn't happen. Since I couldn't do the test on an empty stomach. PC Order Azithromycin 250 mg Zpack. I just finished it ,it hasn't help Doctor Ravishankar suggested I make an appointment with Doctor Chase Caller. The phlegm and gagging is almost equal to the height of the problem when I went on the Ciprofloxacin. I hope I can make an appointment this way. Sorry for the length of this message but at least you know everything that's  going on.

## 2022-04-22 NOTE — Telephone Encounter (Signed)
Please ask front desk if the patient can be worked in 8:30 AM slot but there is no conflict with research in the next 1.  If not see nurse practitioner

## 2022-04-23 NOTE — Telephone Encounter (Signed)
Called and spoke with pt and have scheduled for an OV with BW Monday, 10/2 at 3:00. Nothing further needed.

## 2022-04-26 ENCOUNTER — Ambulatory Visit (INDEPENDENT_AMBULATORY_CARE_PROVIDER_SITE_OTHER): Payer: PPO | Admitting: Primary Care

## 2022-04-26 ENCOUNTER — Telehealth: Payer: Self-pay | Admitting: Primary Care

## 2022-04-26 ENCOUNTER — Ambulatory Visit (INDEPENDENT_AMBULATORY_CARE_PROVIDER_SITE_OTHER): Payer: PPO

## 2022-04-26 ENCOUNTER — Encounter: Payer: Self-pay | Admitting: Primary Care

## 2022-04-26 VITALS — BP 132/64 | HR 115 | Temp 98.0°F | Ht 69.0 in | Wt 163.4 lb

## 2022-04-26 DIAGNOSIS — J432 Centrilobular emphysema: Secondary | ICD-10-CM

## 2022-04-26 DIAGNOSIS — J470 Bronchiectasis with acute lower respiratory infection: Secondary | ICD-10-CM

## 2022-04-26 DIAGNOSIS — J441 Chronic obstructive pulmonary disease with (acute) exacerbation: Secondary | ICD-10-CM | POA: Diagnosis not present

## 2022-04-26 DIAGNOSIS — Z01811 Encounter for preprocedural respiratory examination: Secondary | ICD-10-CM | POA: Diagnosis not present

## 2022-04-26 DIAGNOSIS — J479 Bronchiectasis, uncomplicated: Secondary | ICD-10-CM

## 2022-04-26 NOTE — Assessment & Plan Note (Signed)
-   Patient is scheduled to have a lumbar fusion on October 18th - He was not cleared today due to acute exacerbation of his bronchiectasis  - He will need FU in 10 days for pre-op clearance

## 2022-04-26 NOTE — Telephone Encounter (Signed)
Looks like I have an apt with him today

## 2022-04-26 NOTE — Patient Instructions (Addendum)
Recommendations: Start Atrovent nasal spray - use 2 sprays per nostril twice daily  Start regular mucinex '1200mg'$  twice daily x 5-7 days  We will likely put you back on abx for 10 days after CXR comes back   Orders: CXR re: pre-op  Respiratory sputum   Office procedure: Hypertonic saline neb x 1   Follow-up: 10 days with Eustaquio Maize NP

## 2022-04-26 NOTE — Assessment & Plan Note (Addendum)
-   Hx pseudomonas, strep pneumonia and serratia in May 2023. Treated with Ciprofloxacin x 7 weeks with ID - He reports increased cough x 2 months - Lungs were clear on exam.  - Received Hypertonic saline neb in office x 1  - Checking CXR; we were able to obtain sputum sample today  - Advised he continue flutter valve three times daily and resume mucinex '1200mg'$  BID x 5-7 days  - We will likely send in either Augmentin or Cipro for bronchiectasis exacerbation x 10-14 days

## 2022-04-26 NOTE — Telephone Encounter (Signed)
Fax received from Dr. Dr Izora Ribas with [facility] health Neurosurgery to perform a Right L4-5 minimally invasive transforminal lumbar inter body fusion, right L5-S1 decompression and discectomy on patient.  Patient needs surgery clearance. Surgery is 05/12/2022. Patient was seen on 03/10/2022. Office protocol is a risk assessment can be sent to surgeon if patient has been seen in 60 days or less.   Sending to Geraldo Pitter NP for risk assessment or recommendations if patient needs to be seen in office prior to surgical procedure.

## 2022-04-26 NOTE — Progress Notes (Signed)
_0  ID: Isaac Weber, male    DOB: 1945-10-29, 76 y.o.   MRN: [id number]  Chief Complaint  Patient presents with   Acute Visit    Referring provider: Nobie Putnam *  HPI:  76 year old male, former smoker quit in February 1973. PMH significant for chronic cough.  Patient of Dr. Chase Caller  Previous [facility] pulmonary encounter:  11/20/2021 -  Dr. Chase Caller  Chief Complaint  Patient presents with   Follow-up    Pt states he has been coughing up a lot of phlegm recently that is yellow in color that is thick in consistency and also has some SOB when he has coughing fits as well as has had problems with wheezing.   Chronic refractory cough  HPI Isaac Weber 76 y.o. -returns for follow-up.  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  He says the clindamycin that he took with ENT has not helped.  1 month ago ENT put him on gabapentin this was Dr. Omelia Blackwater no.  Has been taking gabapentin for a month but this is also not helped.  He is frustrated by it.  He is not clear as to what exactly makes his cough worse or better.  He does say that when he lies down the cough does get precipitated.  In the middle of the night it does not wake him up but when he wakes up he will have a cough.  He does have some thick yellow stringy sputum.  Small in amount.  RSI cough score is unchanged 22.  Now for the last 1 year or so his cough has been significantly worse than baseline.  It is affecting his quality of life.  He also has back pain.  He believes COVID-vaccine has made his cough worse.  We did a nitric oxide test and is borderline elevated at 36.  We discussed options.  He is agreed for bronchoscopy with lavage. Risks of pneumothorax, hemothorax, sedation/anesthesia complications such as cardiac or respiratory arrest or hypotension, stroke and bleeding all explained. Benefits of diagnosis but limitations of non-diagnosis also explained. Patient verbalized understanding and wished to proceed.   We also  discussed about empiric biologic treatment for asthma depending on the bronc results and he is agreeable for this.    He is at his wits end  12/02/2021 Patient has chronic cough. HRCT in August 2022 showed no definitive ILD. Subtle changes at lung bases felt to be non specific but could suggest mild recurrent aspiration. Multiple tiny 2-61m pulmonary nodules which were felt to reflect mucoid impaction. Mild diffuse bronchial wall thickening with mild centrilobular and paraseptal emphysema. He underwent bronchoscopy with Dr. RChase Calleron 11/26/21. BAL grew out Pseudomonas, streptococcus pneumonia and serratia. He recently had prolonged abx use with ENT, likely need IV antibiotics such as cefepime x 12 days and one more for strep coverage. He previously was noted to have a drug allergy to ciprofloxacin. He tells me he had previously been on cipro and morphine after a shoulder surgery in 1990s and lost signifcaint amount of weight. Both medications were listed as an allergy. He has since been on cipro without issues, allergy has been removed from his list.   He reports no improvement in cough with Neurontin. Vocal cord examination by ENT in October 2022 was normal. No significant breathing issues. He denies shortness of breath, chest tightness or wheezing. He is fairly inactive d/t back pain. He would like to change inhalers as Asmanex is tier 3 drug and not formulary  on this plan. FENO was 36 in April.   01/15/2022 Patient presents today for 6 week follow-up.  BAL grew out three separate microorganisms (Pseudomonas, streptococcus pneumonia and serratia). Referred to infectious disease. Last saw on 12/29/21, started on cipro 746m.  He is feeling a lot better. Reports improvement in amount of congestion and cough. He gets up phlegm 1-3 times a day. He is sleeping better. He saw no difference with Spiriva Respimat so he stopped taking this. Using hypertonic sodium chloride neb twice a day. No issues when he use  nebulizer in the morning but in the evening he notices his ankles swell. He showed me a picture of his ankles which were significantly swollen. He has trace to no leg swelling today in office.   Pulmonary function testing in 2022 showed no evidence of restrictive or obstructive lung disease.  He has no symptoms of shortness of breath or wheezing.  For now okay to stay off daily maintenance inhaler.  We will discontinue Asmanex, budesonide and Spiriva.  Continue hypertonic saline nebulizer as needed for increased chest congestion.  We will add flutter valve 3 times daily to loosen congestion.  May consider adding therapy vest in the future to help mobilize secretions if patient continues with cough.  Bronchiectasis with acute lower respiratory infection ([place] - Chronic cough. Patient underwent bronchoscopy on 11/26/21, BAL grew out three separate microorganisms (Pseudomonas, streptococcus pneumonia and serratia). Following with infectious disease, started on oral ciprofloxacin. He has seen significant improvement in his cough and amount of congestion. Continue hypertonic saline nebulizer as needed for increased chest congestion.  We will add flutter valve 3 times daily to loosen congestion.  May consider adding therapy vest in the future to help mobilize secretions if patient continues with cough.   Centrilobular emphysema ([place] - Pulmonary function testing in 2022 showed no evidence of restrictive or obstructive lung disease.  He has no symptoms of shortness of breath or wheezing.  For now okay to stay off daily maintenance inhaler.  We will discontinue Asmanex, budesonide and Spiriva.   Lower extremity edema - Patient noticed increased LE edema since starting hypertonic saline nebulizer, mainly in the evening. He had trace swelling on exam today. Checking BNP and BMET. Advised he wear compression stockings.    03/10/2022 Patient presents today for 4-6 week follow-up. He is doing extremely well today. No  more cough. Rarely has phlegm. He uses flutter as needed. He is off all inhalers and has not needed to use hypertonic saline nebulizer. He saw ID on 7/6, he was released from infectious disease.    04/26/2022- interim hx  Presents today for acute office visit due to cough. Hx bronchiectasis and emphysema. He had bronchoscopy in May 2023 that grew out pseudomonas, strep pneumonia and serratia. He was treated with cipro x 7 week with infectious disease. Symptoms improved dramatically. He was released by ID. Cough has returned, he has had a productive cough x 2 months. Sputum is thick yellow. Associated post nasal drip. Not currently using nasal spray. He is not having any shortness of breath. No longer on maintenance inhaler for emphysema. Maintained on flutter valve as needed. He returned nebulizer machine. PCP put him on zpack and prednisone in late september which he has finished. Lung function was normal in 2022. Needs pre-op clearance for spinal fusion on Oct 05/12/22.    03/16/2021 HRCT Mild diffuse bronchial wall thickening with mild central lobar and paraseptal emphysema.  Multiple tiny 2 to 3 mm pulmonary nodules  scattered throughout the periphery of the lungs bilaterally.  May represent areas of mucoid impaction with thin terminal bronchioles.  Allergies  Allergen Reactions   Morphine And Related Diarrhea and Nausea And Vomiting    Immunization History  Administered Date(s) Administered   Fluad Quad(high Dose 65+) 05/13/2019, 06/09/2020, 04/21/2021, 04/22/2022   Influenza, High Dose Seasonal PF 07/15/2017, 04/12/2018   PFIZER(Purple Top)SARS-COV-2 Vaccination 02/13/2020, 03/05/2020   Pneumococcal Conjugate-13 01/28/2014   Pneumococcal Polysaccharide-23 03/04/2008, 08/26/2020   Tdap 07/12/2011   Zoster Recombinat (Shingrix) 04/12/2017, 08/17/2017    Past Medical History:  Diagnosis Date   Abnormal chest CT    Actinic keratosis    Allergic rhinitis, unspecified    Anemia     Aortic atherosclerosis (HCC)    BPH (benign prostatic hyperplasia)    COPD (chronic obstructive pulmonary disease) ([place])    Coronary artery disease    Cough variant asthma    Glaucoma    Hypertension    Other chronic pain    Pancreatic lesion    Pure hypercholesterolemia, unspecified    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 [facility] SCC, ulcerated. [facility] 04/29/2020    Tobacco History: Social History   Tobacco Use  Smoking Status Former   Packs/day: 3.00   Years: 26.00   Total pack years: 78.00   Types: Cigarettes   Quit date: 09/17/1971   Years since quitting: 50.6   Passive exposure: Past  Smokeless Tobacco Never   Counseling given: Not Answered   Outpatient Medications Prior to Visit  Medication Sig Dispense Refill   albuterol (VENTOLIN HFA) 108 (90 Base) MCG/ACT inhaler Inhale 1-2 puffs into the lungs every 4 (four) hours as needed for wheezing or shortness of breath (cough). 1 each 2   amLODipine (NORVASC) 5 MG tablet TAKE ONE TABLET BY MOUTH ONCE DAILY 90 tablet 0   baclofen (LIORESAL) 10 MG tablet Take 0.5-1 tablets (5-10 mg total) by mouth 2 (two) times daily as needed for muscle spasms. 60 each 2   Blood Glucose Monitoring Suppl (ONE TOUCH ULTRA 2) w/Device KIT Use to check blood sugar up to 2 times daily 1 kit 0   ferrous sulfate 325 (65 FE) MG tablet Take 325 mg by mouth daily with breakfast.     Glucosamine-Chondroitin (GLUCOSAMINE CHONDR COMPLEX PO) Take 1 capsule by mouth 2 (two) times daily.     guaiFENesin (MUCINEX) 600 MG 12 hr tablet Take 1,200 mg by mouth daily.     hydrochlorothiazide (HYDRODIURIL) 12.5 MG tablet TAKE ONE TABLET BY MOUTH ONCE DAILY 90 tablet 0   ibuprofen  (ADVIL) 800 MG tablet Take 1 tablet (800 mg total) by mouth 3 (three) times daily. 90 tablet 0   ipratropium (ATROVENT) 0.06 % nasal spray Place 2 sprays into both nostrils 4 (four) times daily. For up to 5-7 days then stop and use as needed. 15 mL 2   ketoconazole (NIZORAL) 2 % shampoo SHAMPOO INTO SCALP, LET SET FOR 10 MINUTES THEN WASH OFF, USE 3 TIMES PER WEEK 120 mL 3   losartan (COZAAR) 50 MG tablet TAKE ONE TABLET BY MOUTH ONCE DAILY 90 tablet 0   metFORMIN (GLUCOPHAGE-XR) 500 MG 24 hr tablet TAKE ONE  TABLET BY MOUTH EVERYDAY AT BEDTIME 90 tablet 0   Multiple Minerals-Vitamins (CAL MAG ZINC +D3 PO) Take 1 tablet by mouth every evening.     Multiple Vitamin (MULTIVITAMIN WITH MINERALS) TABS tablet Take 1 tablet by mouth daily.     ONETOUCH ULTRA test strip Use to check blood sugar up to twice per day 200 each 5   potassium chloride (KLOR-CON M) 10 MEQ tablet Take 1 tablet (10 mEq total) by mouth daily. 90 tablet 1   rosuvastatin (CRESTOR) 5 MG tablet TAKE ONE TABLET BY MOUTH ONCE DAILY 90 tablet 1   tamsulosin (FLOMAX) 0.4 MG CAPS capsule Take 1 capsule (0.4 mg total) by mouth daily. 90 capsule 3   No facility-administered medications prior to visit.   Review of Systems  Review of Systems  Constitutional: Negative.   HENT: Negative.    Respiratory:  Positive for cough. Negative for shortness of breath and wheezing.   Cardiovascular: Negative.    Physical Exam  BP 132/64 (BP Location: Right Arm, Patient Position: Sitting, Cuff Size: Normal)   Pulse (!) 115   Temp 98 F (36.7 C) (Oral)   Ht _0  (1.753 m)   Wt 163 lb 6.4 oz (74.1 kg)   SpO2 97%   BMI 24.13 kg/m  Physical Exam Constitutional:      Appearance: Normal appearance.  HENT:     Head: Normocephalic and atraumatic.     Mouth/Throat:     Mouth: Mucous membranes are moist.     Pharynx: Oropharynx is clear.  Cardiovascular:     Rate and Rhythm: Normal rate and regular rhythm.  Pulmonary:     Effort: Pulmonary  effort is normal.     Breath sounds: Normal breath sounds. No wheezing, rhonchi or rales.     Comments: No obvious rhonchi or wheezing  Neurological:     General: No focal deficit present.     Mental Status: He is alert and oriented to person, place, and time. Mental status is at baseline.  Psychiatric:        Mood and Affect: Mood normal.        Behavior: Behavior normal.        Thought Content: Thought content normal.        Judgment: Judgment normal.      Lab Results:  CBC    Component Value Date/Time   WBC 7.6 01/28/2022 1012   RBC 4.36 01/28/2022 1012   HGB 13.5 01/28/2022 1012   HGB 11.6 (L) 09/16/2014 1354   HCT 38.2 (L) 01/28/2022 1012   HCT 33.6 (L) 09/16/2014 1354   PLT 183 01/28/2022 1012   PLT 232 09/16/2014 1354   MCV 87.6 01/28/2022 1012   MCV 81.4 09/16/2014 1354   MCH 31.0 01/28/2022 1012   MCHC 35.3 01/28/2022 1012   RDW 12.2 01/28/2022 1012   RDW 13.9 09/16/2014 1354   LYMPHSABS 1.3 01/28/2022 1012   LYMPHSABS 1.8 09/16/2014 1354   MONOABS 0.7 01/28/2022 1012   MONOABS 0.6 09/16/2014 1354   EOSABS 0.3 01/28/2022 1012   EOSABS 0.3 09/16/2014 1354   BASOSABS 0.0 01/28/2022 1012   BASOSABS 0.0 09/16/2014 1354    BMET    Component Value Date/Time   NA 135 01/28/2022 1012   NA 140 09/16/2014 1354   K 4.0 01/28/2022 1012   K 3.7 09/16/2014 1354   CL 103 01/28/2022 1012   CO2 27 01/28/2022 1012   CO2 26 09/16/2014 1354   GLUCOSE 223 (H) 01/28/2022  1012   GLUCOSE 153 (H) 09/16/2014 1354   BUN 15 01/28/2022 1012   BUN 16.7 09/16/2014 1354   CREATININE 0.81 01/28/2022 1012   CREATININE 0.92 06/15/2021 0809   CREATININE 0.9 09/16/2014 1354   CALCIUM 9.6 01/28/2022 1012   CALCIUM 9.4 09/16/2014 1354   GFRNONAA >60 01/28/2022 1012   GFRNONAA 88 06/02/2020 0852   GFRAA 102 06/02/2020 0852    BNP No results found for: "BNP"  ProBNP    Component Value Date/Time   PROBNP 13.0 01/15/2022 1503    Imaging: DG PAIN CLINIC C-ARM 1-60 MIN NO  REPORT  Result Date: 04/14/2022 Fluoro was used, but no Radiologist interpretation will be provided. Please refer to "NOTES" tab for provider progress note.    Assessment & Plan:   Bronchiectasis without complication (HCC) - Hx pseudomonas, strep pneumonia and serratia in May 2023. Treated with Ciprofloxacin x 7 weeks with ID - He reports increased cough x 2 months - Lungs were clear on exam.  - Received Hypertonic saline neb in office x 1  - Checking CXR; we were able to obtain sputum sample today  - Advised he continue flutter valve three times daily and resume mucinex 1275m BID x 5-7 days  - We will likely send in either Augmentin or Cipro for bronchiectasis exacerbation x 10-14 days   Centrilobular emphysema ([place] - He has no shortness of breath symptoms or wheezing.  He is no longer on maintence inhalers. PFTs in 2022 showed no evidence of restrictive or obstructive lung disease. If symptomatic I would resume Spiriva. Recommend avoiding ICS d.t bronchiectasis.   Pre-operative respiratory examination - Patient is scheduled to have a lumbar fusion on October 18th - He was not cleared today due to acute exacerbation of his bronchiectasis  - He will need FU in 10 days for pre-op clearance     EMartyn Ehrich NP 04/26/2022

## 2022-04-26 NOTE — Assessment & Plan Note (Signed)
-   He has no shortness of breath symptoms or wheezing.  He is no longer on maintence inhalers. PFTs in 2022 showed no evidence of restrictive or obstructive lung disease. If symptomatic I would resume Spiriva. Recommend avoiding ICS d.t bronchiectasis.

## 2022-04-27 ENCOUNTER — Encounter: Payer: Self-pay | Admitting: Neurosurgery

## 2022-04-27 ENCOUNTER — Encounter: Payer: Self-pay | Admitting: Internal Medicine

## 2022-04-27 MED ORDER — CIPROFLOXACIN HCL 500 MG PO TABS: 500.0000 mg | ORAL_TABLET | Freq: Two times a day (BID) | ORAL | 0 refills | Status: DC

## 2022-04-27 NOTE — Addendum Note (Signed)
Addended by: Martyn Ehrich on: 04/27/2022 08:34 AM   Modules accepted: Orders

## 2022-04-27 NOTE — Progress Notes (Signed)
Please let patient know CXR showed clear lungs. Ill send in cipro 500 BID x 10 days

## 2022-04-27 NOTE — Telephone Encounter (Signed)
Yes he get see me sooner than the 17th. I should have an opening but if needed you can double book me. I sent abx in

## 2022-04-28 ENCOUNTER — Ambulatory Visit (INDEPENDENT_AMBULATORY_CARE_PROVIDER_SITE_OTHER): Payer: PPO | Admitting: Primary Care

## 2022-04-28 ENCOUNTER — Encounter: Payer: Self-pay | Admitting: Primary Care

## 2022-04-28 VITALS — BP 126/80 | HR 99 | Temp 97.8°F | Ht 69.0 in | Wt 164.2 lb

## 2022-04-28 DIAGNOSIS — Z01811 Encounter for preprocedural respiratory examination: Secondary | ICD-10-CM | POA: Diagnosis not present

## 2022-04-28 DIAGNOSIS — J471 Bronchiectasis with (acute) exacerbation: Secondary | ICD-10-CM

## 2022-04-28 DIAGNOSIS — J432 Centrilobular emphysema: Secondary | ICD-10-CM | POA: Diagnosis not present

## 2022-04-28 NOTE — Progress Notes (Signed)
_0  ID: [name], male    DOB: 1945-12-21, 76 y.o.   MRN: [id number]  No chief complaint on file.   Referring provider: Nobie Putnam *  HPI: 76 year old male, former smoker quit in February 1973. PMH significant for chronic cough.  Patient of Dr. Chase Caller.  Previous [facility] pulmonary encounter:  11/20/2021 -  Dr. Chase Caller  Chief Complaint  Patient presents with   Follow-up    Pt states he has been coughing up a lot of phlegm recently that is yellow in color that is thick in consistency and also has some SOB when he has coughing fits as well as has had problems with wheezing.   Chronic refractory cough  HPI ED MANDICH 76 y.o. -returns for follow-up.  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  He says the clindamycin that he took with ENT has not helped.  1 month ago ENT put him on gabapentin this was Dr. Omelia Blackwater no.  Has been taking gabapentin for a month but this is also not helped.  He is frustrated by it.  He is not clear as to what exactly makes his cough worse or better.  He does say that when he lies down the cough does get precipitated.  In the middle of the night it does not wake him up but when he wakes up he will have a cough.  He does have some thick yellow stringy sputum.  Small in amount.  RSI cough score is unchanged 22.  Now for the last 1 year or so his cough has been significantly worse than baseline.  It is affecting his quality of life.  He also has back pain.  He believes COVID-vaccine has made his cough worse.  We did a nitric oxide test and is borderline elevated at 36.  We discussed options.  He is agreed for bronchoscopy with lavage. Risks of pneumothorax, hemothorax, sedation/anesthesia complications such as cardiac or respiratory arrest or hypotension, stroke and bleeding all explained. Benefits of diagnosis but limitations of non-diagnosis also explained. Patient verbalized understanding and wished to proceed.   We also discussed about empiric biologic  treatment for asthma depending on the bronc results and he is agreeable for this.    He is at his wits end  12/02/2021 Patient has chronic cough. HRCT in August 2022 showed no definitive ILD. Subtle changes at lung bases felt to be non specific but could suggest mild recurrent aspiration. Multiple tiny 2-80m pulmonary nodules which were felt to reflect mucoid impaction. Mild diffuse bronchial wall thickening with mild centrilobular and paraseptal emphysema. He underwent bronchoscopy with Dr. RChase Calleron 11/26/21. BAL grew out Pseudomonas, streptococcus pneumonia and serratia. He recently had prolonged abx use with ENT, likely need IV antibiotics such as cefepime x 12 days and one more for strep coverage. He previously was noted to have a drug allergy to ciprofloxacin. He tells me he had previously been on cipro and morphine after a shoulder surgery in 1990s and lost signifcaint amount of weight. Both medications were listed as an allergy. He has since been on cipro without issues, allergy has been removed from his list.   He reports no improvement in cough with Neurontin. Vocal cord examination by ENT in October 2022 was normal. No significant breathing issues. He denies shortness of breath, chest tightness or wheezing. He is fairly inactive d/t back pain. He would like to change inhalers as Asmanex is tier 3 drug and not formulary on this plan. FENO was 36 in  April.   01/15/2022 Patient presents today for 6 week follow-up.  BAL grew out three separate microorganisms (Pseudomonas, streptococcus pneumonia and serratia). Referred to infectious disease. Last saw on 12/29/21, started on cipro 710m.  He is feeling a lot better. Reports improvement in amount of congestion and cough. He gets up phlegm 1-3 times a day. He is sleeping better. He saw no difference with Spiriva Respimat so he stopped taking this. Using hypertonic sodium chloride neb twice a day. No issues when he use nebulizer in the morning but in the  evening he notices his ankles swell. He showed me a picture of his ankles which were significantly swollen. He has trace to no leg swelling today in office.   Pulmonary function testing in 2022 showed no evidence of restrictive or obstructive lung disease.  He has no symptoms of shortness of breath or wheezing.  For now okay to stay off daily maintenance inhaler.  We will discontinue Asmanex, budesonide and Spiriva.  Continue hypertonic saline nebulizer as needed for increased chest congestion.  We will add flutter valve 3 times daily to loosen congestion.  May consider adding therapy vest in the future to help mobilize secretions if patient continues with cough.  Bronchiectasis with acute lower respiratory infection ([place] - Chronic cough. Patient underwent bronchoscopy on 11/26/21, BAL grew out three separate microorganisms (Pseudomonas, streptococcus pneumonia and serratia). Following with infectious disease, started on oral ciprofloxacin. He has seen significant improvement in his cough and amount of congestion. Continue hypertonic saline nebulizer as needed for increased chest congestion.  We will add flutter valve 3 times daily to loosen congestion.  May consider adding therapy vest in the future to help mobilize secretions if patient continues with cough.   Centrilobular emphysema ([place] - Pulmonary function testing in 2022 showed no evidence of restrictive or obstructive lung disease.  He has no symptoms of shortness of breath or wheezing.  For now okay to stay off daily maintenance inhaler.  We will discontinue Asmanex, budesonide and Spiriva.   Lower extremity edema - Patient noticed increased LE edema since starting hypertonic saline nebulizer, mainly in the evening. He had trace swelling on exam today. Checking BNP and BMET. Advised he wear compression stockings.    03/10/2022 Patient presents today for 4-6 week follow-up. He is doing extremely well today. No more cough. Rarely has phlegm. He  uses flutter as needed. He is off all inhalers and has not needed to use hypertonic saline nebulizer. He saw ID on 7/6, he was released from infectious disease.   04/26/2022 Presents today for acute office visit due to cough. Hx bronchiectasis and emphysema. He had bronchoscopy in May 2023 that grew out pseudomonas, strep pneumonia and serratia. He was treated with cipro x 7 week with infectious disease. Symptoms improved dramatically. He was released by ID. Cough has returned, he has had a productive cough x 2 months. Sputum is thick yellow. Associated post nasal drip. Not currently using nasal spray. He is not having any shortness of breath. No longer on maintenance inhaler for emphysema. Maintained on flutter valve as needed. He returned nebulizer machine. PCP put him on zpack and prednisone in late september which he has finished. Lung function was normal in 2022. Needs pre-op clearance for spinal fusion on Oct 05/12/22.   04/28/2022 - Interim hx  Patient presents today for surgical risk assessment. He is planning for spinal fusion on October 05/12/22, surgery will be done in hospital setting and he plans to be admitted for  1-3 days. He was seen earlier this week for cough. We started him on ciprofloxacin to cover for pseudomonas/strep pneumonia. He had CXR on 04/26/22 which was normal. He is doing better today. Started cipro this morning. He started using nasal spray which has significantly helped PND symptoms. Cough has not been as bad today.  Imaging:  03/16/2021 HRCT- Mild diffuse bronchial wall thickening with mild central lobar and paraseptal emphysema.  Multiple tiny 2 to 3 mm pulmonary nodules scattered throughout the periphery of the lungs bilaterally.  May represent areas of mucoid impaction with thin terminal bronchioles.  Allergies  Allergen Reactions   Morphine And Related Diarrhea and Nausea And Vomiting    Immunization History  Administered Date(s) Administered   Fluad Quad(high Dose  65+) 05/13/2019, 06/09/2020, 04/21/2021, 04/22/2022   Influenza, High Dose Seasonal PF 07/15/2017, 04/12/2018   PFIZER(Purple Top)SARS-COV-2 Vaccination 02/13/2020, 03/05/2020   Pneumococcal Conjugate-13 01/28/2014   Pneumococcal Polysaccharide-23 03/04/2008, 08/26/2020   Tdap 07/12/2011   Zoster Recombinat (Shingrix) 04/12/2017, 08/17/2017    Past Medical History:  Diagnosis Date   Abnormal chest CT    Actinic keratosis    Allergic rhinitis, unspecified    Anemia    Aortic atherosclerosis (HCC)    BPH (benign prostatic hyperplasia)    COPD (chronic obstructive pulmonary disease) ([place])    Coronary artery disease    Cough variant asthma    Glaucoma    Hypertension    Other chronic pain    Pancreatic lesion    Pure hypercholesterolemia, unspecified    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 WD SCC, ulcerated. [facility] 04/29/2020    Tobacco History: Social History   Tobacco Use  Smoking Status Former   Packs/day: 3.00   Years: 26.00   Total pack years: 78.00   Types: Cigarettes   Quit date: 09/17/1971   Years since quitting: 50.6   Passive exposure: Past  Smokeless Tobacco Never   Counseling given: Not Answered   Outpatient Medications Prior to Visit  Medication Sig Dispense Refill   albuterol (VENTOLIN HFA) 108 (90 Base) MCG/ACT inhaler Inhale 1-2 puffs into the lungs every 4 (four) hours as needed for wheezing or shortness of breath (cough). 1 each 2   amLODipine (NORVASC) 5 MG tablet TAKE ONE TABLET BY MOUTH ONCE DAILY 90 tablet 0   baclofen (LIORESAL) 10 MG tablet Take 0.5-1 tablets (5-10 mg total) by mouth 2 (two) times daily as needed for muscle spasms. 60 each 2   Blood Glucose  Monitoring Suppl (ONE TOUCH ULTRA 2) w/Device KIT Use to check blood sugar up to 2 times daily 1 kit 0   ciprofloxacin (CIPRO) 500 MG tablet Take 1 tablet (500 mg total) by mouth 2 (two) times daily. 20 tablet 0   ferrous sulfate 325 (65 FE) MG tablet Take 325 mg by mouth daily with breakfast.     Glucosamine-Chondroitin (GLUCOSAMINE CHONDR COMPLEX PO) Take 1 capsule by mouth 2 (two) times daily.     guaiFENesin (MUCINEX) 600 MG 12 hr tablet Take 1,200 mg by mouth daily.     hydrochlorothiazide (HYDRODIURIL) 12.5 MG tablet TAKE ONE TABLET BY MOUTH ONCE DAILY 90 tablet 0   ibuprofen (ADVIL) 800  MG tablet Take 1 tablet (800 mg total) by mouth 3 (three) times daily. 90 tablet 0   ipratropium (ATROVENT) 0.06 % nasal spray Place 2 sprays into both nostrils 4 (four) times daily. For up to 5-7 days then stop and use as needed. 15 mL 2   ketoconazole (NIZORAL) 2 % shampoo SHAMPOO INTO SCALP, LET SET FOR 10 MINUTES THEN WASH OFF, USE 3 TIMES PER WEEK 120 mL 3   losartan (COZAAR) 50 MG tablet TAKE ONE TABLET BY MOUTH ONCE DAILY 90 tablet 0   metFORMIN (GLUCOPHAGE-XR) 500 MG 24 hr tablet TAKE ONE TABLET BY MOUTH EVERYDAY AT BEDTIME 90 tablet 0   Multiple Minerals-Vitamins (CAL MAG ZINC +D3 PO) Take 1 tablet by mouth every evening.     Multiple Vitamin (MULTIVITAMIN WITH MINERALS) TABS tablet Take 1 tablet by mouth daily.     ONETOUCH ULTRA test strip Use to check blood sugar up to twice per day 200 each 5   potassium chloride (KLOR-CON M) 10 MEQ tablet Take 1 tablet (10 mEq total) by mouth daily. 90 tablet 1   rosuvastatin (CRESTOR) 5 MG tablet TAKE ONE TABLET BY MOUTH ONCE DAILY 90 tablet 1   tamsulosin (FLOMAX) 0.4 MG CAPS capsule Take 1 capsule (0.4 mg total) by mouth daily. 90 capsule 3   No facility-administered medications prior to visit.    Review of Systems  Review of Systems  Constitutional: Negative.   HENT:  Positive for postnasal drip.   Respiratory:  Positive for cough. Negative for  shortness of breath.   Cardiovascular: Negative.     Physical Exam  BP 126/80 (BP Location: Left Arm, Patient Position: Sitting, Cuff Size: Normal)   Pulse 99   Temp 97.8 F (36.6 C) (Oral)   Ht _0  (1.753 m)   Wt 164 lb 3.2 oz (74.5 kg)   SpO2 98%   BMI 24.25 kg/m  Physical Exam Constitutional:      Appearance: Normal appearance.  Cardiovascular:     Rate and Rhythm: Normal rate.  Pulmonary:     Effort: Pulmonary effort is normal.     Comments: Mostly clear, few isolated rales lung bases R>L Neurological:     Mental Status: He is alert.      Lab Results:  CBC    Component Value Date/Time   WBC 7.6 01/28/2022 1012   RBC 4.36 01/28/2022 1012   HGB 13.5 01/28/2022 1012   HGB 11.6 (L) 09/16/2014 1354   HCT 38.2 (L) 01/28/2022 1012   HCT 33.6 (L) 09/16/2014 1354   PLT 183 01/28/2022 1012   PLT 232 09/16/2014 1354   MCV 87.6 01/28/2022 1012   MCV 81.4 09/16/2014 1354   MCH 31.0 01/28/2022 1012   MCHC 35.3 01/28/2022 1012   RDW 12.2 01/28/2022 1012   RDW 13.9 09/16/2014 1354   LYMPHSABS 1.3 01/28/2022 1012   LYMPHSABS 1.8 09/16/2014 1354   MONOABS 0.7 01/28/2022 1012   MONOABS 0.6 09/16/2014 1354   EOSABS 0.3 01/28/2022 1012   EOSABS 0.3 09/16/2014 1354   BASOSABS 0.0 01/28/2022 1012   BASOSABS 0.0 09/16/2014 1354    BMET    Component Value Date/Time   NA 135 01/28/2022 1012   NA 140 09/16/2014 1354   K 4.0 01/28/2022 1012   K 3.7 09/16/2014 1354   CL 103 01/28/2022 1012   CO2 27 01/28/2022 1012   CO2 26 09/16/2014 1354   GLUCOSE 223 (H) 01/28/2022 1012   GLUCOSE 153 (H) 09/16/2014 1354  BUN 15 01/28/2022 1012   BUN 16.7 09/16/2014 1354   CREATININE 0.81 01/28/2022 1012   CREATININE 0.92 06/15/2021 0809   CREATININE 0.9 09/16/2014 1354   CALCIUM 9.6 01/28/2022 1012   CALCIUM 9.4 09/16/2014 1354   GFRNONAA >60 01/28/2022 1012   GFRNONAA 88 06/02/2020 0852   GFRAA 102 06/02/2020 0852    BNP No results found for: "BNP"  ProBNP     Component Value Date/Time   PROBNP 13.0 01/15/2022 1503    Imaging: DG Chest 2 View  Result Date: 04/26/2022 CLINICAL DATA:  Exacerbation of bronchitis. EXAM: CHEST - 2 VIEW COMPARISON:  03/10/2022.  CT, 03/16/2021. FINDINGS: Cardiac silhouette is normal in size and configuration. Normal mediastinal and hilar contours. Clear lungs.  No pleural effusion or pneumothorax. Skeletal structures intact. IMPRESSION: No active cardiopulmonary disease. Electronically Signed   By: Lajean Manes M.D.   On: 04/26/2022 16:16   DG PAIN CLINIC C-ARM 1-60 MIN NO REPORT  Result Date: 04/14/2022 Fluoro was used, but no Radiologist interpretation will be provided. Please refer to "NOTES" tab for provider progress note.    Assessment & Plan:   Bronchiectasis with (acute) exacerbation ([place]) -  He is currently on 10 day course of ciprofloxacin for mild bronchiectasis exacerbation, conservatively managing d/t hx pseudomonas, streptococcus pneumonia and serratia.  - Continue mucinex twice daily and flutter valve three times a day  - If cough worsens or he develops purulent sputum advised patient to notify our office  Pre-operative respiratory examination - Patient will be considered intermediate risk for prolonged mechanical ventilation and/or postop pulmonary complications due to history of bronchiectasis with recent flare up. Exam today was benign. CXR and spirometry were normal. Ultimate clearance will be decided upon by surgeon. After surgery recommend early ambulation, use incentive spirometer every hour and to wear compression stockings.   Major Pulmonary risks identified in the multifactorial risk analysis are but not limited to a) pneumonia; b) recurrent intubation risk; c) prolonged or recurrent acute respiratory failure needing mechanical ventilation; d) prolonged hospitalization; e) DVT/Pulmonary embolism; f) Acute Pulmonary edema  Recommend 1. Short duration of surgery as much as possible and avoid  paralytic if possible 2. Recovery in step down or ICU with Pulmonary consultation if needed  3. DVT prophylaxis 4. Aggressive pulmonary toilet with o2, bronchodilatation, and incentive spirometry and early ambulation     1) RISK FOR PROLONGED MECHANICAL VENTILAION - > 48h  1A) Arozullah - Prolonged mech ventilation risk Arozullah Postperative Pulmonary Risk Score - for mech ventilation dependence >48h [employer], Ann Surg 2000, major non-cardiac surgery) Comment Score  Type of surgery - abd ao aneurysm (27), thoracic (21), neurosurgery / upper abdominal / vascular (21), neck (11) Spinal surgery 11  Emergency Surgery - (11)  0  ALbumin < 3 or poor nutritional state - (9)  0  BUN > 30 -  (8)  0  Partial or completely dependent functional status - (7)  0  COPD -  (6) Bronchiectasis / no COPD  6  Age - 60 to 63 (4), > 70  (6)  6  TOTAL  23  Risk Stratifcation scores  - < 10 (0.5%), 11-19 (1.8%), 20-27 (4.2%), 28-40 (10.1%), >40 (26.6%)  4.2%     1B) GUPTA - Prolonged Mech Vent Risk Score source Risk  Guptal post op prolonged mech ventilation > 48h or reintubation < 30 days - ACS 2007-2008 dataset - http://lewis-perez.info/ [employer]ca    2) RISK FOR POST OP PNEUMONIA Score source Risk  Lyndel Safe - Post Op Pnemounia risk  [employer] 0.4 % Risk of postoperative pneumonia    R3) ISK FOR ANY POST-OP PULMONARY COMPLICATION Score source Risk  CANET/ARISCAT Score - risk for ANY/ALl pulmonary complications - > risk of in-hospital post-op pulmonary complications (composite including respiratory failure, respiratory infection, pleural effusion, atelectasis, pneumothorax, bronchospasm, aspiration pneumonitis) [employer] - based on age, anemia, pulse ox, resp infection prior 30d,  incision site, duration of surgery, and emergency v elective surgery Intermediate risk 13.3% risk of in-hospital post-op pulmonary complications (composite including respiratory failure, respiratory infection, pleural effusion, atelectasis, pneumothorax, bronchospasm, aspiration pneumonitis)      Martyn Ehrich, NP 04/28/2022

## 2022-04-28 NOTE — Telephone Encounter (Signed)
Spoke with the pt and scheduled appt with [facility] for 1 pm today

## 2022-04-28 NOTE — Assessment & Plan Note (Addendum)
-    He is currently on 10 day course of ciprofloxacin for mild bronchiectasis exacerbation, conservatively managing d/t hx pseudomonas, streptococcus pneumonia and serratia.  - Continue mucinex twice daily and flutter valve three times a day  - If cough worsens or he develops purulent sputum advised patient to notify our office

## 2022-04-28 NOTE — Assessment & Plan Note (Addendum)
-   Patient will be considered intermediate risk for prolonged mechanical ventilation and/or postop pulmonary complications due to history of bronchiectasis with recent flare up. Exam today was benign. CXR and spirometry were normal. Ultimate clearance will be decided upon by surgeon. After surgery recommend early ambulation, use incentive spirometer every hour and to wear compression stockings.   Major Pulmonary risks identified in the multifactorial risk analysis are but not limited to a) pneumonia; b) recurrent intubation risk; c) prolonged or recurrent acute respiratory failure needing mechanical ventilation; d) prolonged hospitalization; e) DVT/Pulmonary embolism; f) Acute Pulmonary edema  Recommend 1. Short duration of surgery as much as possible and avoid paralytic if possible 2. Recovery in step down or ICU with Pulmonary consultation if needed  3. DVT prophylaxis 4. Aggressive pulmonary toilet with o2, bronchodilatation, and incentive spirometry and early ambulation

## 2022-04-28 NOTE — Patient Instructions (Addendum)
-   Always great seeing you both - Glad you have seen some improvement in post nasal drip symptoms and cough - Continue cipro x 10 days, nasal spray, Mucinex and flutter valve - You will be considered intermediate risk for prolonged mechanical ventilation and/or postop pulmonary complications due to history of bronchiectasis recent acute flareup.  Ultimate clearance will be decided upon by your surgeon. - After surgery recommend early ambulation, use incentive spirometer every hour and to wear compression stockings  Orders: - Spirometry today  Follow-up: - If symptoms worsen notify our office - Call me on 10/16 or 10/17 to let me know how you are doing

## 2022-04-29 LAB — RESPIRATORY CULTURE OR RESPIRATORY AND SPUTUM CULTURE
MICRO NUMBER:: 13994575
RESULT:: NORMAL
SPECIMEN QUALITY:: ADEQUATE

## 2022-04-29 NOTE — Telephone Encounter (Signed)
OV notes and clearance form have been faxed back to [facility] Neurosurgery. Nothing further needed at this time.

## 2022-04-29 NOTE — Progress Notes (Signed)
Sputum normal, if changes we will let him know

## 2022-05-03 ENCOUNTER — Encounter: Payer: Self-pay | Admitting: Family Medicine

## 2022-05-03 ENCOUNTER — Encounter: Payer: Self-pay | Admitting: Internal Medicine

## 2022-05-03 ENCOUNTER — Telehealth: Payer: Self-pay | Admitting: Primary Care

## 2022-05-03 ENCOUNTER — Telehealth: Payer: Self-pay | Admitting: Urgent Care

## 2022-05-03 ENCOUNTER — Encounter
Admission: RE | Admit: 2022-05-03 | Discharge: 2022-05-03 | Disposition: A | Discharge status: Home / Self Care | Payer: PPO | Source: Ambulatory Visit | Attending: Neurosurgery | Admitting: Neurosurgery

## 2022-05-03 ENCOUNTER — Other Ambulatory Visit: Payer: Self-pay

## 2022-05-03 VITALS — BP 114/69 | HR 114 | Resp 16 | Ht 69.0 in | Wt 164.2 lb

## 2022-05-03 DIAGNOSIS — Z01818 Encounter for other preprocedural examination: Secondary | ICD-10-CM | POA: Insufficient documentation

## 2022-05-03 DIAGNOSIS — Z01812 Encounter for preprocedural laboratory examination: Secondary | ICD-10-CM

## 2022-05-03 DIAGNOSIS — E876 Hypokalemia: Secondary | ICD-10-CM

## 2022-05-03 DIAGNOSIS — Z01811 Encounter for preprocedural respiratory examination: Secondary | ICD-10-CM

## 2022-05-03 DIAGNOSIS — Z01810 Encounter for preprocedural cardiovascular examination: Secondary | ICD-10-CM | POA: Diagnosis not present

## 2022-05-03 HISTORY — DX: Unspecified osteoarthritis, unspecified site: M19.90

## 2022-05-03 HISTORY — DX: Pneumonia, unspecified organism: J18.9

## 2022-05-03 LAB — URINALYSIS, ROUTINE W REFLEX MICROSCOPIC
Bacteria, UA: NONE SEEN
Bilirubin Urine: NEGATIVE
Glucose, UA: >=500 mg/dL — AB
Hgb urine dipstick: NEGATIVE
Ketones, ur: NEGATIVE mg/dL
Leukocytes,Ua: NEGATIVE
Nitrite: NEGATIVE
Protein, ur: NEGATIVE mg/dL
Specific Gravity, Urine: 1.011 (ref 1.005–1.030)
pH: 6 (ref 5.0–8.0)

## 2022-05-03 LAB — CBC
HCT: 32.6 % — ABNORMAL LOW (ref 39.0–52.0)
Hemoglobin: 12 g/dL — ABNORMAL LOW (ref 13.0–17.0)
MCH: 32.4 pg (ref 26.0–34.0)
MCHC: 36.8 g/dL — ABNORMAL HIGH (ref 30.0–36.0)
MCV: 88.1 fL (ref 80.0–100.0)
Platelets: 192 10*3/uL (ref 150–400)
RBC: 3.7 MIL/uL — ABNORMAL LOW (ref 4.22–5.81)
RDW: 14.8 % (ref 11.5–15.5)
WBC: 6.4 10*3/uL (ref 4.0–10.5)
nRBC: 0 % (ref 0.0–0.2)

## 2022-05-03 LAB — TYPE AND SCREEN
ABO/RH(D): B NEG
Antibody Screen: NEGATIVE

## 2022-05-03 LAB — SURGICAL PCR SCREEN
MRSA, PCR: NEGATIVE
Staphylococcus aureus: POSITIVE — AB

## 2022-05-03 LAB — BASIC METABOLIC PANEL
Anion gap: 4 — ABNORMAL LOW (ref 5–15)
BUN: 15 mg/dL (ref 8–23)
CO2: 26 mmol/L (ref 22–32)
Calcium: 9 mg/dL (ref 8.9–10.3)
Chloride: 103 mmol/L (ref 98–111)
Creatinine, Ser: 0.72 mg/dL (ref 0.61–1.24)
GFR, Estimated: >60 mL/min (ref >60)
Glucose, Bld: 288 mg/dL — ABNORMAL HIGH (ref 70–99)
Potassium: 3.1 mmol/L — ABNORMAL LOW (ref 3.5–5.1)
Sodium: 133 mmol/L — ABNORMAL LOW (ref 135–145)

## 2022-05-03 NOTE — Progress Notes (Signed)
  [facility] Perioperative Services: Pre-Admission/Anesthesia Testing  Abnormal Lab Notification   Date: 05/03/22  Name: Isaac Weber MRN:   [id number]  Re: Abnormal labs noted during PAT appointment   Notified:  Provider Name Provider Role Notification Mode  Meade Maw, MD Neurosurgery (Surgeon) Routed and/or faxed via Sherre Scarlet, MD Primary Care Provider Routed and/or faxed via [place] and Notes:  ABNORMAL LAB VALUE(S): Lab Results  Component Value Date   K 3.1 (L) 05/03/2022   Isaac Weber is scheduled for an elective RIGHT L4-5 MINIMALLY INVASIVE (MIS) TRANSFORAMINAL LUMBAR INTERBODY FUSION; RIGHT L5-S1 DISCECTOMY on 05/12/2022. In review of his medication reconciliation, it is noted that the patient is taking prescribed diuretic medications (HCTZ 12.5 mg). Additionally, he is taking oral KLOR-CON 10 mEq daily.  Please note, in efforts to promote a safe and effective anesthetic course, per current guidelines/standards set by the [facility] anesthesia team, the minimal acceptable K+ level for the patient to proceed with general anesthesia is 3.0 mmol/L. With that being said, if the patient drops any lower, his elective procedure will need to be postponed until K+ is better optimized. Contacted patient to discuss. Reviewed options of increasing oral K+ dose vs increasing dietary intake of potassium rich foods. Patient elected to increase K+ dose.  He has a supply of KLOR-CON 10 mEq tablets at home. Plan is for him to increased dose to 20 mEq daily starting tomorrow and continuing on until the day of his surgery. He reports that he has a sufficient supply.   Patient advises that PCP in process of making medication changes at this time. He asked that I let Dr. Parks Ranger know about low K+. I assured him that POC would be forwarded to PCP for review. Encouraged patient to follow up with MD about 2-3 weeks postoperatively  to have labs rechecked to ensure that levels are remaining within normal range.   Will send copy of this note to surgeon and PCP to make them aware of K+ level and plans for correction. Discussed that PCP may elect to pursue a change in diuretic therapy to a K+ sparing type medication, or alternatively, they may consider increasing daily K+ supplement dose. Patient verbalized understanding. Order entered to recheck K+ on the day of his surgery to ensure optimization.  Patient verbalized understanding of POC as it stands at this point. Wished patient the best of luck with his upcoming surgery and subsequent recovery. He was encouraged to return call to the PAT clinic, or to his surgeon's office, should any questions or concerns arise between now and the time of his surgery. Patient was appreciative of the care/concern expressed by PAT staff.   Encounter Diagnoses  Name Primary?   Pre-operative laboratory examination Yes   Diuretic-induced hypokalemia    Honor Loh, MSN, APRN, FNP-C, CEN [facility]  Peri-operative Services Nurse Practitioner Phone: [phone] Fax: [phone] 05/03/22 7:15 PM

## 2022-05-03 NOTE — Patient Instructions (Addendum)
Your procedure is scheduled on: 05/12/22 - Wednesday Report to the Registration Desk on the 1st floor of the [facility]. To find out your arrival time, please call [phone] between 1PM - 3PM on: 05/11/22 - Tuesday If your arrival time is 6:00 am, do not arrive prior to that time as the [place] entrance doors do not open until 6:00 am.  REMEMBER: Instructions that are not followed completely may result in serious medical risk, up to and including death; or upon the discretion of your surgeon and anesthesiologist your surgery may need to be rescheduled.  Do not eat food after midnight the night before surgery.  No gum chewing, lozengers or hard candies.  You may however, drink CLEAR liquids up to 2 hours before you are scheduled to arrive for your surgery. Do not drink anything within 2 hours of your scheduled arrival time.  Clear liquids include: - water  - apple juice without pulp - gatorade (not RED colors) - black coffee or tea (Do NOT add milk or creamers to the coffee or tea) Do NOT drink anything that is not on this list.  TAKE THESE MEDICATIONS THE MORNING OF SURGERY WITH A SIP OF WATER:  - amLODipine (NORVASC)  - albuterol (VENTOLIN HFA)   HOLD  for 2 days prior to your procedure- metFORMIN (GLUCOPHAGE-XR)  One week prior to surgery: Stop Anti-inflammatories (NSAIDS) such as Advil, Aleve, Ibuprofen, Motrin, Naproxen, Naprosyn and Aspirin based products such as Excedrin, Goodys Powder, BC Powder.  Stop ANY OVER THE COUNTER supplements 1 week , until after surgery.Multiple Minerals-Vitamins (CAL MAG ZINC +D3 PO), Multiple Vitamin (MULTIVITAMIN ), Glucosamine-Chondroitin .  You may however, continue to take Tylenol if needed for pain up until the day of surgery.  No Alcohol for 24 hours before or after surgery.  No Smoking including e-cigarettes for 24 hours prior to surgery.  No chewable tobacco products for at least 6 hours prior to surgery.  No nicotine  patches on the day of surgery.  Do not use any "recreational" drugs for at least a week prior to your surgery.  Please be advised that the combination of cocaine and anesthesia may have negative outcomes, up to and including death. If you test positive for cocaine, your surgery will be cancelled.  On the morning of surgery brush your teeth with toothpaste and water, you may rinse your mouth with mouthwash if you wish. Do not swallow any toothpaste or mouthwash.  Use CHG Soap or wipes as directed on instruction sheet.  Do not wear jewelry, make-up, hairpins, clips or nail polish.  Do not wear lotions, powders, or perfumes.   Do not shave body from the neck down 48 hours prior to surgery just in case you cut yourself which could leave a site for infection.  Also, freshly shaved skin may become irritated if using the CHG soap.  Contact lenses, hearing aids and dentures may not be worn into surgery.  Do not bring valuables to the hospital. [facility] is not responsible for any missing/lost belongings or valuables.   Notify your doctor if there is any change in your medical condition (cold, fever, infection).  Wear comfortable clothing (specific to your surgery type) to the hospital.  After surgery, you can help prevent lung complications by doing breathing exercises.  Take deep breaths and cough every 1-2 hours. Your doctor may order a device called an Incentive Spirometer to help you take deep breaths. When coughing or sneezing, hold a pillow firmly against  your incision with both hands. This is called "splinting." Doing this helps protect your incision. It also decreases belly discomfort.  If you are being admitted to the hospital overnight, leave your suitcase in the car. After surgery it may be brought to your room.  If you are being discharged the day of surgery, you will not be allowed to drive home. You will need a responsible adult (18 years or older) to drive you home and stay  with you that night.   If you are taking public transportation, you will need to have a responsible adult (18 years or older) with you. Please confirm with your physician that it is acceptable to use public transportation.   Please call the [place] Dept. at [phone] if you have any questions about these instructions.  Surgery Visitation Policy:  Patients undergoing a surgery or procedure may have two family members or support persons with them as long as the person is not COVID-19 positive or experiencing its symptoms.   Inpatient Visitation:    Visiting hours are 7 a.m. to 8 p.m. Up to four visitors are allowed at one time in a patient room, including children. The visitors may rotate out with other people during the day. One designated support person (adult) may remain overnight.

## 2022-05-03 NOTE — Telephone Encounter (Signed)
Called patient and let him know that was an old voicemail from [place] last week. He verbalized understanding. Nothing further needed

## 2022-05-04 MED ORDER — POTASSIUM CHLORIDE ER 10 MEQ PO TBCR: EXTENDED_RELEASE_TABLET | ORAL | 0 refills | Status: DC

## 2022-05-04 NOTE — Addendum Note (Signed)
Addended by: Honor Loh on: 05/04/2022 08:41 AM   Modules accepted: Orders

## 2022-05-07 ENCOUNTER — Other Ambulatory Visit: Payer: Self-pay | Admitting: Family Medicine

## 2022-05-07 DIAGNOSIS — E876 Hypokalemia: Secondary | ICD-10-CM

## 2022-05-07 DIAGNOSIS — E1169 Type 2 diabetes mellitus with other specified complication: Secondary | ICD-10-CM

## 2022-05-07 DIAGNOSIS — I10 Essential (primary) hypertension: Secondary | ICD-10-CM

## 2022-05-07 NOTE — Telephone Encounter (Signed)
Requested Prescriptions  Pending Prescriptions Disp Refills  . potassium chloride (KLOR-CON M) 10 MEQ tablet [Pharmacy Med Name: potassium chloride ER 10 mEq tablet,extended release(part/cryst)] 90 tablet 2    Sig: TAKE ONE TABLET BY MOUTH ONCE DAILY     Endocrinology:  Minerals - Potassium Supplementation Failed - 05/07/2022  1:46 PM      Failed - K in normal range and within 360 days    Potassium  Date Value Ref Range Status  05/03/2022 3.1 (L) 3.5 - 5.1 mmol/L Final  09/16/2014 3.7 3.5 - 5.1 mEq/L Final         Passed - Cr in normal range and within 360 days    Creatinine  Date Value Ref Range Status  09/16/2014 0.9 0.7 - 1.3 mg/dL Final   Creat  Date Value Ref Range Status  06/15/2021 0.92 0.70 - 1.28 mg/dL Final   Creatinine, Ser  Date Value Ref Range Status  05/03/2022 0.72 0.61 - 1.24 mg/dL Final         Passed - Valid encounter within last 12 months    Recent Outpatient Visits          1 month ago Acute rhinosinusitis   [place], DO   2 months ago Cervical spondylosis with radiculopathy   [place] [facility] at [facility], Earley Abide, MD   2 months ago Supraspinatus syndrome, right   [place] [facility] at [facility], Earley Abide, MD   4 months ago Type 2 diabetes mellitus with other specified complication, without long-term current use of insulin ([place])   [facility] Olin Hauser, DO   7 months ago Derangement of right SI joint   [place] [facility] at [facility], Earley Abide, MD      Future Appointments            In 3 days Ralene Bathe, MD [place]   In 1 month [place], DO [facility]           . metFORMIN (GLUCOPHAGE-XR) 500 MG 24 hr tablet [Pharmacy Med Name: metformin ER 500 mg tablet,extended release 24 hr] 90 tablet 1     Sig: TAKE ONE TABLET BY MOUTH EVERYDAY AT BEDTIME     Endocrinology:  Diabetes - Biguanides Failed - 05/07/2022  1:46 PM      Failed - B12 Level in normal range and within 720 days    Vitamin B-12  Date Value Ref Range Status  09/16/2014 1,101 (H) 211 - 911 pg/mL Final         Passed - Cr in normal range and within 360 days    Creatinine  Date Value Ref Range Status  09/16/2014 0.9 0.7 - 1.3 mg/dL Final   Creat  Date Value Ref Range Status  06/15/2021 0.92 0.70 - 1.28 mg/dL Final   Creatinine, Ser  Date Value Ref Range Status  05/03/2022 0.72 0.61 - 1.24 mg/dL Final         Passed - HBA1C is between 0 and 7.9 and within 180 days    Hgb A1c MFr Bld  Date Value Ref Range Status  12/07/2021 7.1 (H) <5.7 % of total Hgb Final    Comment:    For someone without known diabetes, a hemoglobin A1c value of 6.5% or greater indicates that they may have  diabetes and  this should be confirmed with a follow-up  test. . For someone with known diabetes, a value <7% indicates  that their diabetes is well controlled and a value  greater than or equal to 7% indicates suboptimal  control. A1c targets should be individualized based on  duration of diabetes, age, comorbid conditions, and  other considerations. . Currently, no consensus exists regarding use of hemoglobin A1c for diagnosis of diabetes for children. .          Passed - eGFR in normal range and within 360 days    GFR, Est African American  Date Value Ref Range Status  06/02/2020 102 > OR = 60 mL/min/1.69m Final   GFR, Est Non African American  Date Value Ref Range Status  06/02/2020 88 > OR = 60 mL/min/1.737mFinal   GFR, Estimated  Date Value Ref Range Status  05/03/2022 >60 >60 mL/min Final    Comment:    (NOTE) Calculated using the CKD-EPI Creatinine Equation (2021)    GFR  Date Value Ref Range Status  01/15/2022 81.65 >60.00 mL/min Final    Comment:    Calculated using the CKD-EPI Creatinine Equation  (2021)   eGFR  Date Value Ref Range Status  06/15/2021 86 > OR = 60 mL/min/1.7346minal    Comment:    The eGFR is based on the CKD-EPI 2021 equation. To calculate  the new eGFR from a previous Creatinine or Cystatin C result, go to https://www.kidney.org/professionals/ kdoqi/gfr%5Fcalculator          Passed - Valid encounter within last 6 months    Recent Outpatient Visits          1 month ago [facility]   2 months ago Cervical spondylosis with radiculopathy   [place] [facility]   2 months ago Supraspinatus syndrome, right   [place] [facility]   4 months ago Type 2 diabetes mellitus with other specified complication, without long-term current use of insulin ([facility]   7 months ago Derangement of right SI joint   [place] [facility]      Future Appointments            In 3 days [place] [facility] normal limits and completed in the last 12 months    WBC  Date Value Ref Range Status  05/03/2022 6.4 4.0 - 10.5 K/uL Final   RBC  Date Value Ref Range Status  05/03/2022 3.70 (L) 4.22 - 5.81 MIL/uL Final   Hemoglobin  Date Value Ref Range Status  05/03/2022 12.0 (L) 13.0 - 17.0 g/dL Final   HGB  Date Value Ref Range Status  09/16/2014 11.6 (L) 13.0 - 17.1 g/dL Final   HCT  Date Value Ref Range Status  05/03/2022 32.6 (L) 39.0 - 52.0 % Final  09/16/2014 33.6 (L) 38.4 - 49.9 % Final   MCHC  Date Value Ref Range Status  05/03/2022 36.8 (H) 30.0 - 36.0 g/dL Final   [facility] Surgery Cent[facility] Value Ref Range Status  05/03/2022 32.4  26.0 -  34.0 pg Final   MCV  Date Value Ref Range Status  05/03/2022 88.1 80.0 - 100.0 fL Final  09/16/2014 81.4 79.3 - 98.0 fL Final   No results found for: "PLTCOUNTKUC", "LABPLAT", "POCPLA" RDW  Date Value Ref Range Status  05/03/2022 14.8 11.5 - 15.5 % Final  09/16/2014 13.9 11.0 - 14.6 % Final         . hydrochlorothiazide (HYDRODIURIL) 12.5 MG tablet [Pharmacy Med Name: hydrochlorothiazide 12.5 mg tablet] 90 tablet 2    Sig: TAKE ONE TABLET BY MOUTH ONCE DAILY     Cardiovascular: Diuretics - Thiazide Failed - 05/07/2022  1:46 PM      Failed - K in normal range and within 180 days    Potassium  Date Value Ref Range Status  05/03/2022 3.1 (L) 3.5 - 5.1 mmol/L Final  09/16/2014 3.7 3.5 - 5.1 mEq/L Final         Failed - Na in normal range and within 180 days    Sodium  Date Value Ref Range Status  05/03/2022 133 (L) 135 - 145 mmol/L Final  09/16/2014 140 136 - 145 mEq/L Final         Passed - Cr in normal range and within 180 days    Creatinine  Date Value Ref Range Status  09/16/2014 0.9 0.7 - 1.3 mg/dL Final   Creat  Date Value Ref Range Status  06/15/2021 0.92 0.70 - 1.28 mg/dL Final   Creatinine, Ser  Date Value Ref Range Status  05/03/2022 0.72 0.61 - 1.24 mg/dL Final         Passed - Last BP in normal range    BP Readings from Last 1 Encounters:  05/03/22 114/69         Passed - Valid encounter within last 6 months    Recent Outpatient Visits          1 month ago Acute rhinosinusitis   [facility] Olin Hauser, DO   2 months ago Cervical spondylosis with radiculopathy   [place] [facility] at [place], Earley Abide, MD   2 months ago Supraspinatus syndrome, right   [facility], Earley Abide, MD   4 months ago Type 2 diabetes mellitus with other specified complication, without long-term current use of insulin ([place])   [facility] Olin Hauser, DO   7 months ago Derangement of right SI joint   [place] [facility] at [place], Earley Abide, MD      Future Appointments            In 3 days Ralene Bathe, MD [place]   In 1 month [place], DO [facility]           . amLODipine (NORVASC) 5 MG tablet [Pharmacy Med Name: amlodipine 5 mg tablet] 90 tablet 1    Sig: TAKE ONE TABLET BY MOUTH ONCE DAILY     Cardiovascular: Calcium Channel Blockers 2 Failed - 05/07/2022  1:46 PM      Failed - Last Heart Rate in normal range    Pulse Readings from Last 1 Encounters:  05/03/22 (!) 114         Passed - Last BP in normal range    BP Readings from Last 1 Encounters:  05/03/22 114/69  Passed - Valid encounter within last 6 months    Recent Outpatient Visits          1 month ago Acute rhinosinusitis   [facility] [place], Devonne Doughty, DO   2 months ago Cervical spondylosis with radiculopathy   [place] [facility] at [facility], Earley Abide, MD   2 months ago Supraspinatus syndrome, right   [place] [facility] at [facility], Earley Abide, MD   4 months ago Type 2 diabetes mellitus with other specified complication, without long-term current use of insulin ([place])   [facility] Olin Hauser, DO   7 months ago Derangement of right SI joint   [place] [facility], Earley Abide, MD      Future Appointments            In 3 days Ralene Bathe, MD [place]   In 1 month [place], DO [facility]           . losartan (COZAAR) 50 MG tablet [Pharmacy Med Name: losartan 50 mg tablet] 90 tablet 2    Sig: TAKE ONE TABLET BY MOUTH ONCE DAILY     Cardiovascular:  Angiotensin  Receptor Blockers Failed - 05/07/2022  1:46 PM      Failed - K in normal range and within 180 days    Potassium  Date Value Ref Range Status  05/03/2022 3.1 (L) 3.5 - 5.1 mmol/L Final  09/16/2014 3.7 3.5 - 5.1 mEq/L Final         Passed - Cr in normal range and within 180 days    Creatinine  Date Value Ref Range Status  09/16/2014 0.9 0.7 - 1.3 mg/dL Final   Creat  Date Value Ref Range Status  06/15/2021 0.92 0.70 - 1.28 mg/dL Final   Creatinine, Ser  Date Value Ref Range Status  05/03/2022 0.72 0.61 - 1.24 mg/dL Final         Passed - Patient is not pregnant      Passed - Last BP in normal range    BP Readings from Last 1 Encounters:  05/03/22 114/69         Passed - Valid encounter within last 6 months    Recent Outpatient Visits          1 month ago Acute rhinosinusitis   [place], DO   2 months ago Cervical spondylosis with radiculopathy   [place] [facility] at [place], Earley Abide, MD   2 months ago Supraspinatus syndrome, right   [place] [facility] at [facility], Earley Abide, MD   4 months ago Type 2 diabetes mellitus with other specified complication, without long-term current use of insulin [facility])   [place], DO   7 months ago Derangement of right SI joint   [place] [facility] at [facility], Earley Abide, MD      Future Appointments            In 3 days Ralene Bathe, MD [place]   In 1 month [place], DO [facility], [facility]

## 2022-05-07 NOTE — Telephone Encounter (Signed)
Requested medications are due for refill today.  yes  Requested medications are on the active medications list.  yes  Last refill. 02/05/2022 #90 0 , and 05/04/2022 #40 0 rf  Future visit scheduled.   yes  Notes to clinic.  Refills failed protocol d/t expired /abnormal labs.    Requested Prescriptions  Pending Prescriptions Disp Refills   potassium chloride (KLOR-CON M) 10 MEQ tablet [Pharmacy Med Name: potassium chloride ER 10 mEq tablet,extended release(part/cryst)] 90 tablet 2    Sig: TAKE ONE TABLET BY MOUTH ONCE DAILY     Endocrinology:  Minerals - Potassium Supplementation Failed - 05/07/2022  1:46 PM      Failed - K in normal range and within 360 days    Potassium  Date Value Ref Range Status  05/03/2022 3.1 (L) 3.5 - 5.1 mmol/L Final  09/16/2014 3.7 3.5 - 5.1 mEq/L Final         Passed - Cr in normal range and within 360 days    Creatinine  Date Value Ref Range Status  09/16/2014 0.9 0.7 - 1.3 mg/dL Final   Creat  Date Value Ref Range Status  06/15/2021 0.92 0.70 - 1.28 mg/dL Final   Creatinine, Ser  Date Value Ref Range Status  05/03/2022 0.72 0.61 - 1.24 mg/dL Final         Passed - Valid encounter within last 12 months    Recent Outpatient Visits           1 month ago Acute rhinosinusitis   [place], DO   2 months ago Cervical spondylosis with radiculopathy   [place] [facility], Earley Abide, MD   2 months ago Supraspinatus syndrome, right   [place] [facility] at [facility], Earley Abide, MD   4 months ago Type 2 diabetes mellitus with other specified complication, without long-term current use of insulin ([place])   [facility] Olin Hauser, DO   7 months ago Derangement of right SI joint   [place] [facility] at [facility], Earley Abide, MD       Future  Appointments             In 3 days Ralene Bathe, MD [place]   In 1 month [place], DO [facility]             hydrochlorothiazide (HYDRODIURIL) 12.5 MG tablet [Pharmacy Med Name: hydrochlorothiazide 12.5 mg tablet] 90 tablet 2    Sig: TAKE ONE TABLET BY MOUTH ONCE DAILY     Cardiovascular: Diuretics - Thiazide Failed - 05/07/2022  1:46 PM      Failed - K in normal range and within 180 days    Potassium  Date Value Ref Range Status  05/03/2022 3.1 (L) 3.5 - 5.1 mmol/L Final  09/16/2014 3.7 3.5 - 5.1 mEq/L Final         Failed - Na in normal range and within 180 days    Sodium  Date Value Ref Range Status  05/03/2022 133 (L) 135 - 145 mmol/L Final  09/16/2014 140 136 - 145 mEq/L Final         Passed - Cr in normal range and within 180 days    Creatinine  Date Value Ref Range Status  09/16/2014 0.9 0.7 - 1.3 mg/dL Final   Creat  Date  Value Ref Range Status  06/15/2021 0.92 0.70 - 1.28 mg/dL Final   Creatinine, Ser  Date Value Ref Range Status  05/03/2022 0.72 0.61 - 1.24 mg/dL Final         Passed - Last BP in normal range    BP Readings from Last 1 Encounters:  05/03/22 114/69         Passed - Valid encounter within last 6 months    Recent Outpatient Visits           1 month ago Acute rhinosinusitis   [facility] [place], Devonne Doughty, DO   2 months ago Cervical spondylosis with radiculopathy   [place] [facility] at [facility], Earley Abide, MD   2 months ago Supraspinatus syndrome, right   [place] [facility] at [facility], Earley Abide, MD   4 months ago Type 2 diabetes mellitus with other specified complication, without long-term current use of insulin ([place])   [facility], Devonne Doughty, DO   7 months ago Derangement of right SI joint   [place] [facility] at  [facility], Earley Abide, MD       Future Appointments             In 3 days Ralene Bathe, MD [place]   In 1 month [facility], [facility]             losartan (COZAAR) 50 MG tablet [Pharmacy Med Name: losartan 50 mg tablet] 90 tablet 2    Sig: TAKE ONE TABLET BY MOUTH ONCE DAILY     Cardiovascular:  Angiotensin Receptor Blockers Failed - 05/07/2022  1:46 PM      Failed - K in normal range and within 180 days    Potassium  Date Value Ref Range Status  05/03/2022 3.1 (L) 3.5 - 5.1 mmol/L Final  09/16/2014 3.7 3.5 - 5.1 mEq/L Final         Passed - Cr in normal range and within 180 days    Creatinine  Date Value Ref Range Status  09/16/2014 0.9 0.7 - 1.3 mg/dL Final   Creat  Date Value Ref Range Status  06/15/2021 0.92 0.70 - 1.28 mg/dL Final   Creatinine, Ser  Date Value Ref Range Status  05/03/2022 0.72 0.61 - 1.24 mg/dL Final         Passed - Patient is not pregnant      Passed - Last BP in normal range    BP Readings from Last 1 Encounters:  05/03/22 114/69         Passed - Valid encounter within last 6 months    Recent Outpatient Visits           1 month ago Acute rhinosinusitis   [place], DO   2 months ago Cervical spondylosis with radiculopathy   [place] [facility] at [place], Earley Abide, MD   2 months ago Supraspinatus syndrome, right   [facility] at [facility], Earley Abide, MD   4 months ago Type 2 diabetes mellitus with other specified complication, without long-term current use of insulin [facility])   [facility] Olin Hauser, DO   7 months ago Derangement of right SI joint   [place] [facility] at [employer], Earley Abide, MD       Future Appointments             In 3 days Ralene Bathe, MD [place]   In 1 month [place], DO [facility], [facility]            Signed Prescriptions Disp Refills   metFORMIN (GLUCOPHAGE-XR) 500 MG 24 hr tablet 90 tablet 1    Sig: TAKE ONE TABLET BY MOUTH EVERYDAY AT BEDTIME     Endocrinology:  Diabetes - Biguanides Failed - 05/07/2022  1:46 PM      Failed - B12 Level in normal range and within 720 days    Vitamin B-12  Date Value Ref Range Status  09/16/2014 1,101 (H) 211 - 911 pg/mL Final         Passed - Cr in normal range and within 360 days    Creatinine  Date Value Ref Range Status  09/16/2014 0.9 0.7 - 1.3 mg/dL Final   Creat  Date Value Ref Range Status  06/15/2021 0.92 0.70 - 1.28 mg/dL Final   Creatinine, Ser  Date Value Ref Range Status  05/03/2022 0.72 0.61 - 1.24 mg/dL Final         Passed - HBA1C is between 0 and 7.9 and within 180 days    Hgb A1c MFr Bld  Date Value Ref Range Status  12/07/2021 7.1 (H) <5.7 % of total Hgb Final    Comment:    For someone without known diabetes, a hemoglobin A1c value of 6.5% or greater indicates that they may have  diabetes and this should be confirmed with a follow-up  test. . For someone with known diabetes, a value <7% indicates  that their diabetes is well controlled and a value  greater than or equal to 7% indicates suboptimal  control. A1c targets should be individualized based on  duration of diabetes, age, comorbid conditions, and  other considerations. . Currently, no consensus exists regarding use of hemoglobin A1c for diagnosis of diabetes for children. .          Passed - eGFR in normal range and within 360 days    GFR, Est African American  Date Value Ref Range Status  06/02/2020 102 > OR = 60 mL/min/1.48m Final   GFR, Est Non African American  Date Value Ref Range Status  06/02/2020 88 > OR = 60 mL/min/1.793mFinal   GFR, Estimated  Date Value Ref Range Status  05/03/2022 >60 >60 mL/min Final     Comment:    (NOTE) Calculated using the CKD-EPI Creatinine Equation (2021)    GFR  Date Value Ref Range Status  01/15/2022 81.65 >60.00 mL/min Final    Comment:    Calculated using the CKD-EPI Creatinine Equation (2021)   eGFR  Date Value Ref Range Status  06/15/2021 86 > OR = 60 mL/min/1.7368minal    Comment:    The eGFR is based on the CKD-EPI 2021 equation. To calculate  the new eGFR from a previous Creatinine or Cystatin C result, go to https://www.kidney.org/professionals/ kdoqi/gfr%5Fcalculator          Passed - Valid encounter within last 6 months    Recent Outpatient Visits           1 month ago Acute rhinosinusitis   [place]   2 months ago Cervical spondylosis with radiculopathy   [place]  [facility] at [facility], Earley Abide, MD   2 months ago Supraspinatus syndrome, right   [place] [facility] at [facility], Earley Abide, MD   4 months ago Type 2 diabetes mellitus with other specified complication, without long-term current use of insulin ([place])   [facility], Devonne Doughty, DO   7 months ago Derangement of right SI joint   [place] [facility] at [facility], Earley Abide, MD       Future Appointments             In 3 days Ralene Bathe, MD [place]   In 1 month [place], DO [facility]            Passed - CBC within normal limits and completed in the last 12 months    WBC  Date Value Ref Range Status  05/03/2022 6.4 4.0 - 10.5 K/uL Final   RBC  Date Value Ref Range Status  05/03/2022 3.70 (L) 4.22 - 5.81 MIL/uL Final   Hemoglobin  Date Value Ref Range Status  05/03/2022 12.0 (L) 13.0 - 17.0 g/dL Final   HGB  Date Value Ref Range Status  09/16/2014 11.6 (L) 13.0 - 17.1 g/dL Final   HCT  Date Value Ref Range  Status  05/03/2022 32.6 (L) 39.0 - 52.0 % Final  09/16/2014 33.6 (L) 38.4 - 49.9 % Final   MCHC  Date Value Ref Range Status  05/03/2022 36.8 (H) 30.0 - 36.0 g/dL Final   [facility]  Date Value Ref Range Status  05/03/2022 32.4 26.0 - 34.0 pg Final   MCV  Date Value Ref Range Status  05/03/2022 88.1 80.0 - 100.0 fL Final  09/16/2014 81.4 79.3 - 98.0 fL Final   No results found for: "PLTCOUNTKUC", "LABPLAT", "POCPLA" RDW  Date Value Ref Range Status  05/03/2022 14.8 11.5 - 15.5 % Final  09/16/2014 13.9 11.0 - 14.6 % Final          amLODipine (NORVASC) 5 MG tablet 90 tablet 1    Sig: TAKE ONE TABLET BY MOUTH ONCE DAILY     Cardiovascular: Calcium Channel Blockers 2 Failed - 05/07/2022  1:46 PM      Failed - Last Heart Rate in normal range    Pulse Readings from Last 1 Encounters:  05/03/22 (!) 114         Passed - Last BP in normal range    BP Readings from Last 1 Encounters:  05/03/22 114/69         Passed - Valid encounter within last 6 months    Recent Outpatient Visits           1 month ago Acute rhinosinusitis   [facility], DO   2 months ago Cervical spondylosis with radiculopathy   [place] [facility] at [place], Earley Abide, MD   2 months ago Supraspinatus syndrome, right   [facility] at [facility], Earley Abide, MD   4 months ago Type 2 diabetes mellitus with other specified complication, without long-term current use of insulin [facility])   [facility] Olin Haus[facility], DO   7 months ago Derangement of right SI joint   [place] [facility] at [facility], Earley Abide, MD  Future Appointments             In 3 days Ralene Bathe, MD [place]   In 1 month [facility], [facility]

## 2022-05-10 ENCOUNTER — Ambulatory Visit (INDEPENDENT_AMBULATORY_CARE_PROVIDER_SITE_OTHER): Payer: PPO | Admitting: Dermatology

## 2022-05-10 DIAGNOSIS — L570 Actinic keratosis: Secondary | ICD-10-CM

## 2022-05-10 DIAGNOSIS — L820 Inflamed seborrheic keratosis: Secondary | ICD-10-CM

## 2022-05-10 DIAGNOSIS — Z8589 Personal history of malignant neoplasm of other organs and systems: Secondary | ICD-10-CM

## 2022-05-10 DIAGNOSIS — L814 Other melanin hyperpigmentation: Secondary | ICD-10-CM

## 2022-05-10 DIAGNOSIS — Z85828 Personal history of other malignant neoplasm of skin: Secondary | ICD-10-CM | POA: Diagnosis not present

## 2022-05-10 DIAGNOSIS — L821 Other seborrheic keratosis: Secondary | ICD-10-CM | POA: Diagnosis not present

## 2022-05-10 DIAGNOSIS — I8393 Asymptomatic varicose veins of bilateral lower extremities: Secondary | ICD-10-CM | POA: Diagnosis not present

## 2022-05-10 DIAGNOSIS — Z1283 Encounter for screening for malignant neoplasm of skin: Secondary | ICD-10-CM

## 2022-05-10 DIAGNOSIS — D229 Melanocytic nevi, unspecified: Secondary | ICD-10-CM | POA: Diagnosis not present

## 2022-05-10 DIAGNOSIS — L578 Other skin changes due to chronic exposure to nonionizing radiation: Secondary | ICD-10-CM | POA: Diagnosis not present

## 2022-05-10 NOTE — Progress Notes (Signed)
Follow-Up Visit   Subjective  Isaac Weber is a 76 y.o. male who presents for the following: Annual Exam (Hx of SCC and AK). The patient presents for Total-Body Skin Exam (TBSE) for skin cancer screening and mole check.  The patient has spots, moles and lesions to be evaluated, some may be new or changing and the patient has concerns that these could be cancer.  The following portions of the chart were reviewed this encounter and updated as appropriate:   Tobacco  Allergies  Meds  Problems  Med Hx  Surg Hx  Fam Hx     Review of Systems:  No other skin or systemic complaints except as noted in HPI or Assessment and Plan.  Objective  Well appearing patient in no apparent distress; mood and affect are within normal limits.  A full examination was performed including scalp, head, eyes, ears, nose, lips, neck, chest, axillae, abdomen, back, buttocks, bilateral upper extremities, bilateral lower extremities, hands, feet, fingers, toes, fingernails, and toenails. All findings within normal limits unless otherwise noted below.  arms, hands, face Erythematous thin papules/macules with gritty scale.   left pretibial Erythematous stuck-on, waxy papule or plaque   Assessment & Plan  AK (actinic keratosis) arms, hands, face  Actinic keratoses are precancerous spots that appear secondary to cumulative UV radiation exposure/sun exposure over time. They are chronic with expected duration over 1 year. A portion of actinic keratoses will progress to squamous cell carcinoma of the skin. It is not possible to reliably predict which spots will progress to skin cancer and so treatment is recommended to prevent development of skin cancer.  Recommend daily broad spectrum sunscreen SPF 30+ to sun-exposed areas, reapply every 2 hours as needed.  Recommend staying in the shade or wearing long sleeves, sun glasses (UVA+UVB protection) and wide brim hats (4-inch brim around the entire circumference of  the hat). Call for new or changing lesions.  Patient having back surgery this week. Will plan to treat in Jan 2024.  Inflamed seborrheic keratosis left pretibial  Symptomatic, irritating, patient would like treated.  Will treat after back surgery   History of Squamous Cell Carcinoma of the Skin - No evidence of recurrence today - No lymphadenopathy - Recommend regular full body skin exams - Recommend daily broad spectrum sunscreen SPF 30+ to sun-exposed areas, reapply every 2 hours as needed.  - Call if any new or changing lesions are noted between office visits  Lentigines - Scattered tan macules - Due to sun exposure - Benign-appearing, observe - Recommend daily broad spectrum sunscreen SPF 30+ to sun-exposed areas, reapply every 2 hours as needed. - Call for any changes  Seborrheic Keratoses - Stuck-on, waxy, tan-brown papules and/or plaques  - Benign-appearing - Discussed benign etiology and prognosis. - Observe - Call for any changes  Melanocytic Nevi - Tan-brown and/or pink-flesh-colored symmetric macules and papules - Benign appearing on exam today - Observation - Call clinic for new or changing moles - Recommend daily use of broad spectrum spf 30+ sunscreen to sun-exposed areas.   Hemangiomas - Red papules - Discussed benign nature - Observe - Call for any changes  Actinic Damage - Chronic condition, secondary to cumulative UV/sun exposure - diffuse scaly erythematous macules with underlying dyspigmentation - Recommend daily broad spectrum sunscreen SPF 30+ to sun-exposed areas, reapply every 2 hours as needed.  - Staying in the shade or wearing long sleeves, sun glasses (UVA+UVB protection) and wide brim hats (4-inch brim around the entire circumference  of the hat) are also recommended for sun protection.  - Call for new or changing lesions.  Skin cancer screening performed today.  Varicose Veins/Spider Veins - Dilated blue, purple or red veins at the  lower extremities - Reassured - Smaller vessels can be treated by sclerotherapy (a procedure to inject a medicine into the veins to make them disappear) if desired, but the treatment is not covered by insurance. Larger vessels may be covered if symptomatic and we would refer to vascular surgeon if treatment desired.  Return in about 3 months (around 08/10/2022) for treat AK's, ISK.  Graciella Belton, RMA, am acting as scribe for Sarina Ser, MD . Documentation: I have reviewed the above documentation for accuracy and completeness, and I agree with the above.  Sarina Ser, MD

## 2022-05-10 NOTE — Telephone Encounter (Signed)
Chest showed clear lungs. Pain is likely muscular skeletal, could be from coughing, He can take tylenol or Ibuprofen with food if not on blood thinner. Would advise he follow-up with PCP regarding back pain

## 2022-05-10 NOTE — Patient Instructions (Signed)
Melanoma ABCDEs  Melanoma is the most dangerous type of skin cancer, and is the leading cause of death from skin disease.  You are more likely to develop melanoma if you: Have light-colored skin, light-colored eyes, or red or blond hair Spend a lot of time in the sun Tan regularly, either outdoors or in a tanning bed Have had blistering sunburns, especially during childhood Have a close family member who has had a melanoma Have atypical moles or large birthmarks  Early detection of melanoma is key since treatment is typically straightforward and cure rates are extremely high if we catch it early.   The first sign of melanoma is often a change in a mole or a new dark spot.  The ABCDE system is a way of remembering the signs of melanoma.  A for asymmetry:  The two halves do not match. B for border:  The edges of the growth are irregular. C for color:  A mixture of colors are present instead of an even brown color. D for diameter:  Melanomas are usually (but not always) greater than 6mm - the size of a pencil eraser. E for evolution:  The spot keeps changing in size, shape, and color.  Please check your skin once per month between visits. You can use a small mirror in front and a large mirror behind you to keep an eye on the back side or your body.   If you see any new or changing lesions before your next follow-up, please call to schedule a visit.  Please continue daily skin protection including broad spectrum sunscreen SPF 30+ to sun-exposed areas, reapplying every 2 hours as needed when you're outdoors.    Due to recent changes in healthcare laws, you may see results of your pathology and/or laboratory studies on MyChart before the doctors have had a chance to review them. We understand that in some cases there may be results that are confusing or concerning to you. Please understand that not all results are received at the same time and often the doctors may need to interpret multiple  results in order to provide you with the best plan of care or course of treatment. Therefore, we ask that you please give us 2 business days to thoroughly review all your results before contacting the office for clarification. Should we see a critical lab result, you will be contacted sooner.   If You Need Anything After Your Visit  If you have any questions or concerns for your doctor, please call our main line at [phone] and press option 4 to reach your doctor's medical assistant. If no one answers, please leave a voicemail as directed and we will return your call as soon as possible. Messages left after 4 pm will be answered the following business day.   You may also send us a message via MyChart. We typically respond to MyChart messages within 1-2 business days.  For prescription refills, please ask your pharmacy to contact our office. Our fax number is [phone].  If you have an urgent issue when the clinic is closed that cannot wait until the next business day, you can page your doctor at the number below.    Please note that while we do our best to be available for urgent issues outside of office hours, we are not available 24/7.   If you have an urgent issue and are unable to reach us, you may choose to seek medical care at your doctor's office, retail clinic, urgent care   center, or emergency room.  If you have a medical emergency, please immediately call 911 or go to the emergency department.  Pager Numbers  - Dr. Kowalski: [phone]  - Dr. Moye: [phone]  - Dr. Stewart: [phone]  In the event of inclement weather, please call our main line at [phone] for an update on the status of any delays or closures.  Dermatology Medication Tips: Please keep the boxes that topical medications come in in order to help keep track of the instructions about where and how to use these. Pharmacies typically print the medication instructions only on the boxes and not  directly on the medication tubes.   If your medication is too expensive, please contact our office at [phone] option 4 or send us a message through MyChart.   We are unable to tell what your co-pay for medications will be in advance as this is different depending on your insurance coverage. However, we may be able to find a substitute medication at lower cost or fill out paperwork to get insurance to cover a needed medication.   If a prior authorization is required to get your medication covered by your insurance company, please allow us 1-2 business days to complete this process.  Drug prices often vary depending on where the prescription is filled and some pharmacies may offer cheaper prices.  The website www.goodrx.com contains coupons for medications through different pharmacies. The prices here do not account for what the cost may be with help from insurance (it may be cheaper with your insurance), but the website can give you the price if you did not use any insurance.  - You can print the associated coupon and take it with your prescription to the pharmacy.  - You may also stop by our office during regular business hours and pick up a GoodRx coupon card.  - If you need your prescription sent electronically to a different pharmacy, notify our office through [facility] MyChart or by phone at [phone] option 4.     Si Usted Necesita Algo Despus de Su Visita  Tambin puede enviarnos un mensaje a travs de MyChart. Por lo general respondemos a los mensajes de MyChart en el transcurso de 1 a 2 das hbiles.  Para renovar recetas, por favor pida a su farmacia que se ponga en contacto con nuestra oficina. Nuestro nmero de fax es el [phone].  Si tiene un asunto urgente cuando la clnica est cerrada y que no puede esperar hasta el siguiente da hbil, puede llamar/localizar a su doctor(a) al nmero que aparece a continuacin.   Por favor, tenga en cuenta que aunque hacemos  todo lo posible para estar disponibles para asuntos urgentes fuera del horario de oficina, no estamos disponibles las 24 horas del da, los 7 das de la semana.   Si tiene un problema urgente y no puede comunicarse con nosotros, puede optar por buscar atencin mdica  en el consultorio de su doctor(a), en una clnica privada, en un centro de atencin urgente o en una sala de emergencias.  Si tiene una emergencia mdica, por favor llame inmediatamente al 911 o vaya a la sala de emergencias.  Nmeros de bper  - Dr. Kowalski: [phone]  - Dra. Moye: [phone]  - Dra. Stewart: [phone]  En caso de inclemencias del tiempo, por favor llame a nuestra lnea principal al [phone] para una actualizacin sobre el estado de cualquier retraso o cierre.  Consejos para la medicacin en dermatologa: Por favor, guarde las cajas en las   que vienen los medicamentos de uso tpico para ayudarle a seguir las instrucciones sobre dnde y cmo usarlos. Las farmacias generalmente imprimen las instrucciones del medicamento slo en las cajas y no directamente en los tubos del medicamento.   Si su medicamento es muy caro, por favor, pngase en contacto con nuestra oficina llamando al [phone] y presione la opcin 4 o envenos un mensaje a travs de MyChart.   No podemos decirle cul ser su copago por los medicamentos por adelantado ya que esto es diferente dependiendo de la cobertura de su seguro. Sin embargo, es posible que podamos encontrar un medicamento sustituto a menor costo o llenar un formulario para que el seguro cubra el medicamento que se considera necesario.   Si se requiere una autorizacin previa para que su compaa de seguros cubra su medicamento, por favor permtanos de 1 a 2 das hbiles para completar este proceso.  Los precios de los medicamentos varan con frecuencia dependiendo del lugar de dnde se surte la receta y alguna farmacias pueden ofrecer precios ms baratos.  El  sitio web www.goodrx.com tiene cupones para medicamentos de diferentes farmacias. Los precios aqu no tienen en cuenta lo que podra costar con la ayuda del seguro (puede ser ms barato con su seguro), pero el sitio web puede darle el precio si no utiliz ningn seguro.  - Puede imprimir el cupn correspondiente y llevarlo con su receta a la farmacia.  - Tambin puede pasar por nuestra oficina durante el horario de atencin regular y recoger una tarjeta de cupones de GoodRx.  - Si necesita que su receta se enve electrnicamente a una farmacia diferente, informe a nuestra oficina a travs de MyChart de [facility] o por telfono llamando al [phone] y presione la opcin 4.  

## 2022-05-11 ENCOUNTER — Ambulatory Visit: Payer: PPO | Admitting: Primary Care

## 2022-05-12 ENCOUNTER — Inpatient Hospital Stay: Payer: PPO | Admitting: Urgent Care

## 2022-05-12 ENCOUNTER — Inpatient Hospital Stay: Payer: PPO

## 2022-05-12 ENCOUNTER — Encounter: Payer: Self-pay | Admitting: Neurosurgery

## 2022-05-12 ENCOUNTER — Inpatient Hospital Stay
Admission: RE | Admit: 2022-05-12 | Discharge: 2022-05-14 | DRG: 460 | Disposition: A | Discharge status: Home / Self Care | Payer: PPO | Attending: Neurosurgery | Admitting: Neurosurgery

## 2022-05-12 ENCOUNTER — Other Ambulatory Visit: Payer: Self-pay

## 2022-05-12 ENCOUNTER — Encounter
Admission: RE | Disposition: A | Discharge status: Home / Self Care | Payer: Self-pay | Source: Home / Self Care | Attending: Neurosurgery

## 2022-05-12 DIAGNOSIS — I700 Atherosclerosis of aorta: Secondary | ICD-10-CM | POA: Diagnosis present

## 2022-05-12 DIAGNOSIS — Z87891 Personal history of nicotine dependence: Secondary | ICD-10-CM

## 2022-05-12 DIAGNOSIS — M4316 Spondylolisthesis, lumbar region: Secondary | ICD-10-CM | POA: Diagnosis not present

## 2022-05-12 DIAGNOSIS — N400 Enlarged prostate without lower urinary tract symptoms: Secondary | ICD-10-CM | POA: Diagnosis not present

## 2022-05-12 DIAGNOSIS — J4489 Other specified chronic obstructive pulmonary disease: Secondary | ICD-10-CM | POA: Diagnosis not present

## 2022-05-12 DIAGNOSIS — I10 Essential (primary) hypertension: Secondary | ICD-10-CM | POA: Diagnosis not present

## 2022-05-12 DIAGNOSIS — Z79899 Other long term (current) drug therapy: Secondary | ICD-10-CM

## 2022-05-12 DIAGNOSIS — Z885 Allergy status to narcotic agent status: Secondary | ICD-10-CM | POA: Diagnosis not present

## 2022-05-12 DIAGNOSIS — M5441 Lumbago with sciatica, right side: Secondary | ICD-10-CM | POA: Diagnosis not present

## 2022-05-12 DIAGNOSIS — M5416 Radiculopathy, lumbar region: Secondary | ICD-10-CM | POA: Diagnosis present

## 2022-05-12 DIAGNOSIS — G8929 Other chronic pain: Secondary | ICD-10-CM | POA: Diagnosis not present

## 2022-05-12 DIAGNOSIS — Z7984 Long term (current) use of oral hypoglycemic drugs: Secondary | ICD-10-CM | POA: Diagnosis not present

## 2022-05-12 DIAGNOSIS — Z8249 Family history of ischemic heart disease and other diseases of the circulatory system: Secondary | ICD-10-CM | POA: Diagnosis not present

## 2022-05-12 DIAGNOSIS — M4802 Spinal stenosis, cervical region: Secondary | ICD-10-CM | POA: Diagnosis not present

## 2022-05-12 DIAGNOSIS — Z01812 Encounter for preprocedural laboratory examination: Secondary | ICD-10-CM

## 2022-05-12 DIAGNOSIS — Z01818 Encounter for other preprocedural examination: Secondary | ICD-10-CM

## 2022-05-12 DIAGNOSIS — Z7409 Other reduced mobility: Secondary | ICD-10-CM | POA: Diagnosis not present

## 2022-05-12 DIAGNOSIS — Z981 Arthrodesis status: Secondary | ICD-10-CM

## 2022-05-12 DIAGNOSIS — E7800 Pure hypercholesterolemia, unspecified: Secondary | ICD-10-CM | POA: Diagnosis present

## 2022-05-12 DIAGNOSIS — I2510 Atherosclerotic heart disease of native coronary artery without angina pectoris: Secondary | ICD-10-CM | POA: Diagnosis not present

## 2022-05-12 DIAGNOSIS — Z8709 Personal history of other diseases of the respiratory system: Secondary | ICD-10-CM | POA: Diagnosis not present

## 2022-05-12 DIAGNOSIS — M4326 Fusion of spine, lumbar region: Secondary | ICD-10-CM | POA: Diagnosis not present

## 2022-05-12 DIAGNOSIS — Z85828 Personal history of other malignant neoplasm of skin: Secondary | ICD-10-CM | POA: Diagnosis not present

## 2022-05-12 DIAGNOSIS — M48061 Spinal stenosis, lumbar region without neurogenic claudication: Secondary | ICD-10-CM | POA: Diagnosis not present

## 2022-05-12 DIAGNOSIS — H409 Unspecified glaucoma: Secondary | ICD-10-CM | POA: Diagnosis present

## 2022-05-12 DIAGNOSIS — M4803 Spinal stenosis, cervicothoracic region: Secondary | ICD-10-CM | POA: Diagnosis present

## 2022-05-12 HISTORY — PX: APPLICATION OF INTRAOPERATIVE CT SCAN: SHX6668

## 2022-05-12 HISTORY — PX: LUMBAR LAMINECTOMY/DECOMPRESSION MICRODISCECTOMY: SHX5026

## 2022-05-12 HISTORY — PX: TRANSFORAMINAL LUMBAR INTERBODY FUSION W/ MIS 1 LEVEL: SHX6145

## 2022-05-12 LAB — POCT I-STAT, CHEM 8
BUN: 12 mg/dL (ref 8–23)
Calcium, Ion: 1.21 mmol/L (ref 1.15–1.40)
Chloride: 97 mmol/L — ABNORMAL LOW (ref 98–111)
Creatinine, Ser: 0.6 mg/dL — ABNORMAL LOW (ref 0.61–1.24)
Glucose, Bld: 156 mg/dL — ABNORMAL HIGH (ref 70–99)
HCT: 37 % — ABNORMAL LOW (ref 39.0–52.0)
Hemoglobin: 12.6 g/dL — ABNORMAL LOW (ref 13.0–17.0)
Potassium: 4 mmol/L (ref 3.5–5.1)
Sodium: 134 mmol/L — ABNORMAL LOW (ref 135–145)
TCO2: 24 mmol/L (ref 22–32)

## 2022-05-12 LAB — GLUCOSE, CAPILLARY
Glucose-Capillary: 225 mg/dL — ABNORMAL HIGH (ref 70–99)
Glucose-Capillary: 354 mg/dL — ABNORMAL HIGH (ref 70–99)

## 2022-05-12 LAB — ABO/RH: ABO/RH(D): B NEG

## 2022-05-12 SURGERY — MINIMALLY INVASIVE (MIS) TRANSFORAMINAL LUMBAR INTERBODY FUSION (TLIF) 1 LEVEL
Anesthesia: General | Site: Spine Lumbar | Laterality: Right

## 2022-05-12 MED ORDER — BUPIVACAINE HCL (PF) 0.5 % IJ SOLN
INTRAMUSCULAR | Status: AC
  Filled 2022-05-12: qty 30

## 2022-05-12 MED ORDER — KETOROLAC TROMETHAMINE 30 MG/ML IJ SOLN
INTRAMUSCULAR | Status: DC | PRN
  Administered 2022-05-12: 30 mg via INTRAVENOUS

## 2022-05-12 MED ORDER — CEFAZOLIN IN SODIUM CHLORIDE 2-0.9 GM/100ML-% IV SOLN
2.0000 g | Freq: Once | INTRAVENOUS | Status: DC
  Filled 2022-05-12: qty 100

## 2022-05-12 MED ORDER — OXYCODONE HCL 5 MG PO TABS: 10.0000 mg | ORAL_TABLET | ORAL | Status: DC | PRN

## 2022-05-12 MED ORDER — CHLORHEXIDINE GLUCONATE 0.12 % MT SOLN
OROMUCOSAL | Status: AC
  Administered 2022-05-12: 15 mL via OROMUCOSAL
  Filled 2022-05-12: qty 15

## 2022-05-12 MED ORDER — ACETAMINOPHEN 10 MG/ML IV SOLN
INTRAVENOUS | Status: DC | PRN
  Administered 2022-05-12: 1000 mg via INTRAVENOUS

## 2022-05-12 MED ORDER — BUPIVACAINE LIPOSOME 1.3 % IJ SUSP
INTRAMUSCULAR | Status: AC
  Filled 2022-05-12: qty 20

## 2022-05-12 MED ORDER — ONDANSETRON HCL 4 MG/2ML IJ SOLN
INTRAMUSCULAR | Status: DC | PRN
  Administered 2022-05-12: 4 mg via INTRAVENOUS

## 2022-05-12 MED ORDER — SODIUM CHLORIDE (PF) 0.9 % IJ SOLN
INTRAMUSCULAR | Status: AC
  Filled 2022-05-12: qty 20

## 2022-05-12 MED ORDER — SUCCINYLCHOLINE CHLORIDE 200 MG/10ML IV SOSY
PREFILLED_SYRINGE | INTRAVENOUS | Status: DC | PRN
  Administered 2022-05-12: 80 mg via INTRAVENOUS

## 2022-05-12 MED ORDER — KETOROLAC TROMETHAMINE 30 MG/ML IJ SOLN
INTRAMUSCULAR | Status: AC
  Filled 2022-05-12: qty 1

## 2022-05-12 MED ORDER — PROPOFOL 500 MG/50ML IV EMUL
INTRAVENOUS | Status: DC | PRN
  Administered 2022-05-12: 100 ug/kg/min via INTRAVENOUS

## 2022-05-12 MED ORDER — CEFAZOLIN SODIUM-DEXTROSE 2-4 GM/100ML-% IV SOLN
INTRAVENOUS | Status: AC
  Filled 2022-05-12: qty 100

## 2022-05-12 MED ORDER — PROPOFOL 1000 MG/100ML IV EMUL
INTRAVENOUS | Status: AC
  Filled 2022-05-12: qty 100

## 2022-05-12 MED ORDER — ACETAMINOPHEN 10 MG/ML IV SOLN
INTRAVENOUS | Status: AC
  Filled 2022-05-12: qty 100

## 2022-05-12 MED ORDER — INSULIN ASPART 100 UNIT/ML IJ SOLN
0.0000 [IU] | Freq: Three times a day (TID) | INTRAMUSCULAR | Status: DC
  Administered 2022-05-13: 3 [IU] via SUBCUTANEOUS
  Administered 2022-05-13: 8 [IU] via SUBCUTANEOUS
  Administered 2022-05-13 – 2022-05-14 (×2): 3 [IU] via SUBCUTANEOUS
  Administered 2022-05-14: 2 [IU] via SUBCUTANEOUS
  Filled 2022-05-12 (×5): qty 1

## 2022-05-12 MED ORDER — METHOCARBAMOL 500 MG PO TABS
500.0000 mg | ORAL_TABLET | Freq: Four times a day (QID) | ORAL | Status: DC | PRN
  Administered 2022-05-13 – 2022-05-14 (×2): 500 mg via ORAL
  Filled 2022-05-12 (×2): qty 1

## 2022-05-12 MED ORDER — ONDANSETRON HCL 4 MG/2ML IJ SOLN: 4.0000 mg | Freq: Four times a day (QID) | INTRAMUSCULAR | Status: DC | PRN

## 2022-05-12 MED ORDER — DEXAMETHASONE SODIUM PHOSPHATE 10 MG/ML IJ SOLN
INTRAMUSCULAR | Status: DC | PRN
  Administered 2022-05-12: 10 mg via INTRAVENOUS

## 2022-05-12 MED ORDER — FENTANYL CITRATE (PF) 100 MCG/2ML IJ SOLN
INTRAMUSCULAR | Status: AC
  Administered 2022-05-12: 25 ug via INTRAVENOUS
  Filled 2022-05-12: qty 2

## 2022-05-12 MED ORDER — KETOROLAC TROMETHAMINE 15 MG/ML IJ SOLN
7.5000 mg | Freq: Four times a day (QID) | INTRAMUSCULAR | Status: AC
  Administered 2022-05-12 – 2022-05-13 (×3): 7.5 mg via INTRAVENOUS
  Filled 2022-05-12 (×3): qty 1

## 2022-05-12 MED ORDER — ALBUTEROL SULFATE (2.5 MG/3ML) 0.083% IN NEBU: 3.0000 mL | INHALATION_SOLUTION | RESPIRATORY_TRACT | Status: DC | PRN

## 2022-05-12 MED ORDER — SENNA 8.6 MG PO TABS
1.0000 | ORAL_TABLET | Freq: Two times a day (BID) | ORAL | Status: DC
  Administered 2022-05-12 – 2022-05-14 (×4): 8.6 mg via ORAL
  Filled 2022-05-12 (×4): qty 1

## 2022-05-12 MED ORDER — VANCOMYCIN HCL IN DEXTROSE 1-5 GM/200ML-% IV SOLN
1000.0000 mg | Freq: Once | INTRAVENOUS | Status: AC
  Administered 2022-05-12: 1000 mg via INTRAVENOUS

## 2022-05-12 MED ORDER — ORAL CARE MOUTH RINSE: 15.0000 mL | Freq: Once | OROMUCOSAL | Status: AC

## 2022-05-12 MED ORDER — METFORMIN HCL ER 500 MG PO TB24
500.0000 mg | ORAL_TABLET | Freq: Every day | ORAL | Status: DC
  Administered 2022-05-12 – 2022-05-13 (×2): 500 mg via ORAL
  Filled 2022-05-12 (×3): qty 1

## 2022-05-12 MED ORDER — INSULIN ASPART 100 UNIT/ML IJ SOLN
0.0000 [IU] | Freq: Every day | INTRAMUSCULAR | Status: DC
  Administered 2022-05-12: 5 [IU] via SUBCUTANEOUS
  Filled 2022-05-12: qty 1

## 2022-05-12 MED ORDER — 0.9 % SODIUM CHLORIDE (POUR BTL) OPTIME
TOPICAL | Status: DC | PRN
  Administered 2022-05-12: 500 mL

## 2022-05-12 MED ORDER — HYDROCHLOROTHIAZIDE 12.5 MG PO TABS
12.5000 mg | ORAL_TABLET | Freq: Every day | ORAL | Status: DC
  Administered 2022-05-13 – 2022-05-14 (×2): 12.5 mg via ORAL
  Filled 2022-05-12 (×2): qty 1

## 2022-05-12 MED ORDER — OXYCODONE HCL 5 MG PO TABS: 5.0000 mg | ORAL_TABLET | Freq: Once | ORAL | Status: DC | PRN

## 2022-05-12 MED ORDER — CHLORHEXIDINE GLUCONATE 0.12 % MT SOLN: 15.0000 mL | Freq: Once | OROMUCOSAL | Status: AC

## 2022-05-12 MED ORDER — SODIUM CHLORIDE 0.9 % IV SOLN: 250.0000 mL | INTRAVENOUS | Status: DC

## 2022-05-12 MED ORDER — DEXAMETHASONE SODIUM PHOSPHATE 10 MG/ML IJ SOLN
INTRAMUSCULAR | Status: AC
  Filled 2022-05-12: qty 1

## 2022-05-12 MED ORDER — INSULIN ASPART 100 UNIT/ML IJ SOLN: 5.0000 [IU] | Freq: Once | INTRAMUSCULAR | Status: AC

## 2022-05-12 MED ORDER — FENTANYL CITRATE (PF) 100 MCG/2ML IJ SOLN
25.0000 ug | INTRAMUSCULAR | Status: DC | PRN
  Administered 2022-05-12: 50 ug via INTRAVENOUS
  Administered 2022-05-12: 25 ug via INTRAVENOUS
  Administered 2022-05-12: 50 ug via INTRAVENOUS

## 2022-05-12 MED ORDER — MIDAZOLAM HCL 2 MG/2ML IJ SOLN
INTRAMUSCULAR | Status: DC | PRN
  Administered 2022-05-12: 2 mg via INTRAVENOUS

## 2022-05-12 MED ORDER — REMIFENTANIL HCL 1 MG IV SOLR
INTRAVENOUS | Status: AC
  Filled 2022-05-12: qty 1000

## 2022-05-12 MED ORDER — TAMSULOSIN HCL 0.4 MG PO CAPS
0.4000 mg | ORAL_CAPSULE | Freq: Every day | ORAL | Status: DC
  Administered 2022-05-12 – 2022-05-14 (×2): 0.4 mg via ORAL
  Filled 2022-05-12 (×2): qty 1

## 2022-05-12 MED ORDER — ACETAMINOPHEN 650 MG RE SUPP: 650.0000 mg | RECTAL | Status: DC | PRN

## 2022-05-12 MED ORDER — IPRATROPIUM BROMIDE 0.06 % NA SOLN
2.0000 | Freq: Four times a day (QID) | NASAL | Status: DC
  Administered 2022-05-14: 2 via NASAL
  Filled 2022-05-12: qty 15

## 2022-05-12 MED ORDER — ENOXAPARIN SODIUM 40 MG/0.4ML IJ SOSY
40.0000 mg | PREFILLED_SYRINGE | INTRAMUSCULAR | Status: DC
  Administered 2022-05-13 – 2022-05-14 (×2): 40 mg via SUBCUTANEOUS
  Filled 2022-05-12 (×2): qty 0.4

## 2022-05-12 MED ORDER — INSULIN ASPART 100 UNIT/ML IJ SOLN
INTRAMUSCULAR | Status: AC
  Administered 2022-05-12: 5 [IU] via SUBCUTANEOUS
  Filled 2022-05-12: qty 1

## 2022-05-12 MED ORDER — FENTANYL CITRATE (PF) 100 MCG/2ML IJ SOLN
INTRAMUSCULAR | Status: DC | PRN
  Administered 2022-05-12 (×2): 50 ug via INTRAVENOUS

## 2022-05-12 MED ORDER — DEXMEDETOMIDINE HCL IN NACL 80 MCG/20ML IV SOLN
INTRAVENOUS | Status: AC
  Filled 2022-05-12: qty 20

## 2022-05-12 MED ORDER — ONDANSETRON HCL 4 MG PO TABS: 4.0000 mg | ORAL_TABLET | Freq: Four times a day (QID) | ORAL | Status: DC | PRN

## 2022-05-12 MED ORDER — ONDANSETRON HCL 4 MG/2ML IJ SOLN: 4.0000 mg | Freq: Once | INTRAMUSCULAR | Status: DC | PRN

## 2022-05-12 MED ORDER — FAMOTIDINE 20 MG PO TABS: 20.0000 mg | ORAL_TABLET | Freq: Once | ORAL | Status: AC

## 2022-05-12 MED ORDER — DEXMEDETOMIDINE HCL IN NACL 200 MCG/50ML IV SOLN
INTRAVENOUS | Status: DC | PRN
  Administered 2022-05-12 (×3): 4 ug via INTRAVENOUS

## 2022-05-12 MED ORDER — METHOCARBAMOL 1000 MG/10ML IJ SOLN: 500.0000 mg | Freq: Four times a day (QID) | INTRAVENOUS | Status: DC | PRN

## 2022-05-12 MED ORDER — SODIUM CHLORIDE FLUSH 0.9 % IV SOLN
INTRAVENOUS | Status: AC
  Filled 2022-05-12: qty 20

## 2022-05-12 MED ORDER — ACETAMINOPHEN 325 MG PO TABS: 650.0000 mg | ORAL_TABLET | ORAL | Status: DC | PRN

## 2022-05-12 MED ORDER — HYDROMORPHONE HCL 1 MG/ML IJ SOLN: 0.5000 mg | INTRAMUSCULAR | Status: DC | PRN

## 2022-05-12 MED ORDER — BUPIVACAINE-EPINEPHRINE (PF) 0.5% -1:200000 IJ SOLN
INTRAMUSCULAR | Status: AC
  Filled 2022-05-12: qty 30

## 2022-05-12 MED ORDER — SUCCINYLCHOLINE CHLORIDE 200 MG/10ML IV SOSY
PREFILLED_SYRINGE | INTRAVENOUS | Status: AC
  Filled 2022-05-12: qty 10

## 2022-05-12 MED ORDER — MIDAZOLAM HCL 2 MG/2ML IJ SOLN
INTRAMUSCULAR | Status: AC
  Filled 2022-05-12: qty 2

## 2022-05-12 MED ORDER — PROPOFOL 10 MG/ML IV BOLUS
INTRAVENOUS | Status: DC | PRN
  Administered 2022-05-12: 50 mg via INTRAVENOUS
  Administered 2022-05-12: 150 mg via INTRAVENOUS
  Administered 2022-05-12: 30 mg via INTRAVENOUS
  Administered 2022-05-12: 20 mg via INTRAVENOUS

## 2022-05-12 MED ORDER — FAMOTIDINE 20 MG PO TABS
ORAL_TABLET | ORAL | Status: AC
  Administered 2022-05-12: 20 mg via ORAL
  Filled 2022-05-12: qty 1

## 2022-05-12 MED ORDER — FENTANYL CITRATE (PF) 100 MCG/2ML IJ SOLN
INTRAMUSCULAR | Status: AC
  Filled 2022-05-12: qty 2

## 2022-05-12 MED ORDER — LACTATED RINGERS IV SOLN: INTRAVENOUS | Status: DC

## 2022-05-12 MED ORDER — PHENOL 1.4 % MT LIQD: 1.0000 | OROMUCOSAL | Status: DC | PRN

## 2022-05-12 MED ORDER — SODIUM CHLORIDE (PF) 0.9 % IJ SOLN
INTRAMUSCULAR | Status: DC | PRN
  Administered 2022-05-12: 60 mL via INTRAMUSCULAR

## 2022-05-12 MED ORDER — LIDOCAINE HCL (CARDIAC) PF 100 MG/5ML IV SOSY
PREFILLED_SYRINGE | INTRAVENOUS | Status: DC | PRN
  Administered 2022-05-12: 100 mg via INTRAVENOUS

## 2022-05-12 MED ORDER — MENTHOL 3 MG MT LOZG: 1.0000 | LOZENGE | OROMUCOSAL | Status: DC | PRN

## 2022-05-12 MED ORDER — ROSUVASTATIN CALCIUM 5 MG PO TABS
5.0000 mg | ORAL_TABLET | Freq: Every day | ORAL | Status: DC
  Administered 2022-05-12 – 2022-05-14 (×2): 5 mg via ORAL
  Filled 2022-05-12 (×2): qty 1

## 2022-05-12 MED ORDER — OXYCODONE HCL 5 MG/5ML PO SOLN: 5.0000 mg | Freq: Once | ORAL | Status: DC | PRN

## 2022-05-12 MED ORDER — SODIUM CHLORIDE 0.9% FLUSH: 3.0000 mL | INTRAVENOUS | Status: DC | PRN

## 2022-05-12 MED ORDER — SODIUM CHLORIDE 0.9 % IV SOLN: INTRAVENOUS | Status: DC

## 2022-05-12 MED ORDER — LOSARTAN POTASSIUM 50 MG PO TABS
50.0000 mg | ORAL_TABLET | Freq: Every day | ORAL | Status: DC
  Administered 2022-05-13 – 2022-05-14 (×2): 50 mg via ORAL
  Filled 2022-05-12 (×2): qty 1

## 2022-05-12 MED ORDER — AMLODIPINE BESYLATE 5 MG PO TABS
5.0000 mg | ORAL_TABLET | Freq: Every day | ORAL | Status: DC
  Administered 2022-05-13 – 2022-05-14 (×2): 5 mg via ORAL
  Filled 2022-05-12 (×2): qty 1

## 2022-05-12 MED ORDER — PHENYLEPHRINE HCL (PRESSORS) 10 MG/ML IV SOLN
INTRAVENOUS | Status: DC | PRN
  Administered 2022-05-12: 160 ug via INTRAVENOUS

## 2022-05-12 MED ORDER — CEFAZOLIN SODIUM-DEXTROSE 2-4 GM/100ML-% IV SOLN
2.0000 g | INTRAVENOUS | Status: AC
  Administered 2022-05-12: 2 g via INTRAVENOUS

## 2022-05-12 MED ORDER — OXYCODONE HCL 5 MG PO TABS
5.0000 mg | ORAL_TABLET | ORAL | Status: DC | PRN
  Administered 2022-05-14 (×2): 5 mg via ORAL
  Filled 2022-05-12 (×2): qty 1

## 2022-05-12 MED ORDER — SURGIFLO WITH THROMBIN (HEMOSTATIC MATRIX KIT) OPTIME
TOPICAL | Status: DC | PRN
  Administered 2022-05-12: 1 via TOPICAL

## 2022-05-12 MED ORDER — CELECOXIB 200 MG PO CAPS
200.0000 mg | ORAL_CAPSULE | Freq: Two times a day (BID) | ORAL | Status: DC
  Administered 2022-05-12 – 2022-05-13 (×2): 200 mg via ORAL
  Filled 2022-05-12 (×2): qty 1

## 2022-05-12 MED ORDER — SODIUM CHLORIDE 0.9% FLUSH
3.0000 mL | Freq: Two times a day (BID) | INTRAVENOUS | Status: DC
  Administered 2022-05-13 – 2022-05-14 (×3): 3 mL via INTRAVENOUS

## 2022-05-12 MED ORDER — PHENYLEPHRINE HCL-NACL 20-0.9 MG/250ML-% IV SOLN
INTRAVENOUS | Status: AC
  Filled 2022-05-12: qty 250

## 2022-05-12 MED ORDER — VANCOMYCIN HCL IN DEXTROSE 1-5 GM/200ML-% IV SOLN
INTRAVENOUS | Status: AC
  Filled 2022-05-12: qty 200

## 2022-05-12 MED ORDER — ACETAMINOPHEN 10 MG/ML IV SOLN: 1000.0000 mg | Freq: Once | INTRAVENOUS | Status: DC | PRN

## 2022-05-12 MED ORDER — EPHEDRINE SULFATE (PRESSORS) 50 MG/ML IJ SOLN
INTRAMUSCULAR | Status: DC | PRN
  Administered 2022-05-12: 10 mg via INTRAVENOUS
  Administered 2022-05-12: 15 mg via INTRAVENOUS

## 2022-05-12 MED ORDER — REMIFENTANIL HCL 1 MG IV SOLR
INTRAVENOUS | Status: DC | PRN
  Administered 2022-05-12: .1 ug/kg/min via INTRAVENOUS

## 2022-05-12 MED ORDER — PHENYLEPHRINE HCL-NACL 20-0.9 MG/250ML-% IV SOLN
INTRAVENOUS | Status: DC | PRN
  Administered 2022-05-12: 40 ug/min via INTRAVENOUS

## 2022-05-12 MED ORDER — ONDANSETRON HCL 4 MG/2ML IJ SOLN
INTRAMUSCULAR | Status: AC
  Filled 2022-05-12: qty 2

## 2022-05-12 MED ORDER — BUPIVACAINE-EPINEPHRINE (PF) 0.5% -1:200000 IJ SOLN
INTRAMUSCULAR | Status: DC | PRN
  Administered 2022-05-12: 6 mL via PERINEURAL
  Administered 2022-05-12: 1 mL via PERINEURAL

## 2022-05-12 MED ORDER — LIDOCAINE HCL (PF) 2 % IJ SOLN
INTRAMUSCULAR | Status: AC
  Filled 2022-05-12: qty 5

## 2022-05-12 SURGICAL SUPPLY — 88 items
ALLOGRAFT BONE FIBER KORE 10CC (Bone Implant) IMPLANT
BASIN KIT SINGLE STR (MISCELLANEOUS) ×2 IMPLANT
BUR NEURO DRILL SOFT 3.0X3.8M (BURR) ×2 IMPLANT
CAP LOCKING SPINE (Cap) IMPLANT
CHLORAPREP W/TINT 26 (MISCELLANEOUS) ×6 IMPLANT
CNTNR SPEC 2.5X3XGRAD LEK (MISCELLANEOUS) ×2
CONT SPEC 4OZ STER OR WHT (MISCELLANEOUS) ×2
CONT SPEC 4OZ STRL OR WHT (MISCELLANEOUS) ×1
CONTAINER SPEC 2.5X3XGRAD LEK (MISCELLANEOUS) ×2 IMPLANT
COUNTER NEEDLE 20/40 LG (NEEDLE) ×4 IMPLANT
CUP MEDICINE 2OZ PLAST GRAD ST (MISCELLANEOUS) ×6 IMPLANT
DERMABOND ADVANCED .7 DNX12 (GAUZE/BANDAGES/DRESSINGS) ×2 IMPLANT
DRAPE 3D C-ARM OEC (DRAPES) IMPLANT
DRAPE C ARM PK CFD 31 SPINE (DRAPES) ×2 IMPLANT
DRAPE C-ARMOR (DRAPES) IMPLANT
DRAPE INCISE IOBAN 66X45 STRL (DRAPES) ×2 IMPLANT
DRAPE LAPAROTOMY 100X77 ABD (DRAPES) ×2 IMPLANT
DRAPE MICROSCOPE SPINE 48X150 (DRAPES) ×2 IMPLANT
DRAPE SCAN PATIENT (DRAPES) ×2 IMPLANT
DRAPE SURG 17X11 SM STRL (DRAPES) ×4 IMPLANT
DRSG OPSITE POSTOP 3X4 (GAUZE/BANDAGES/DRESSINGS) IMPLANT
DRSG OPSITE POSTOP 4X8 (GAUZE/BANDAGES/DRESSINGS) IMPLANT
DRSG TEGADERM 4X4.75 (GAUZE/BANDAGES/DRESSINGS) IMPLANT
ELECT CAUTERY BLADE TIP 2.5 (TIP) ×2
ELECT EZSTD 165MM 6.5IN (MISCELLANEOUS)
ELECT REM PT RETURN 9FT ADLT (ELECTROSURGICAL) ×2
ELECTRODE CAUTERY BLDE TIP 2.5 (TIP) ×4 IMPLANT
ELECTRODE EZSTD 165MM 6.5IN (MISCELLANEOUS) IMPLANT
ELECTRODE REM PT RTRN 9FT ADLT (ELECTROSURGICAL) ×2 IMPLANT
EX-PIN ORTHOLOCK NAV 4X150 (PIN) IMPLANT
FEE INTRAOP CADWELL SUPPLY NCS (MISCELLANEOUS) IMPLANT
FEE INTRAOP MONITOR IMPULS NCS (MISCELLANEOUS) IMPLANT
GAUZE 4X4 16PLY ~~LOC~~+RFID DBL (SPONGE) ×2 IMPLANT
GLOVE BIOGEL PI IND STRL 6.5 (GLOVE) ×4 IMPLANT
GLOVE BIOGEL PI IND STRL 8.5 (GLOVE) ×2 IMPLANT
GLOVE SURG SYN 6.5 ES PF (GLOVE) ×4 IMPLANT
GLOVE SURG SYN 6.5 PF PI (GLOVE) ×4 IMPLANT
GLOVE SURG SYN 8.5  E (GLOVE) ×8
GLOVE SURG SYN 8.5 E (GLOVE) ×8 IMPLANT
GLOVE SURG SYN 8.5 PF PI (GLOVE) ×8 IMPLANT
GOWN SRG LRG LVL 4 IMPRV REINF (GOWNS) ×6 IMPLANT
GOWN SRG XL LVL 3 NONREINFORCE (GOWNS) ×2 IMPLANT
GOWN STRL NON-REIN TWL XL LVL3 (GOWNS) ×2
GOWN STRL REIN LRG LVL4 (GOWNS) ×6
GRADUATE 1200CC STRL 31836 (MISCELLANEOUS) ×4 IMPLANT
HEMOVAC 400CC 10FR (MISCELLANEOUS) IMPLANT
HOLDER FOLEY CATH W/STRAP (MISCELLANEOUS) ×2 IMPLANT
INTERBODY SABLE 10X26 7-14 15D (Miscellaneous) IMPLANT
INTRAOP CADWELL SUPPLY FEE NCS (MISCELLANEOUS)
INTRAOP DISP SUPPLY FEE NCS (MISCELLANEOUS)
INTRAOP MONITOR FEE IMPULS NCS (MISCELLANEOUS)
INTRAOP MONITOR FEE IMPULSE (MISCELLANEOUS)
K-WIRE 1.6 NITINOL SHARP TIP (WIRE) ×8
KIT PREVENA INCISION MGT 13 (CANNISTER) IMPLANT
KIT SPINAL PRONEVIEW (KITS) ×2 IMPLANT
KNIFE BAYONET SHORT DISCETOMY (MISCELLANEOUS) IMPLANT
KWIRE 1.6 NITINOL SHARP TIP (WIRE) IMPLANT
MANIFOLD NEPTUNE II (INSTRUMENTS) ×2 IMPLANT
MARKER SKIN DUAL TIP RULER LAB (MISCELLANEOUS) ×6 IMPLANT
MARKER SPHERE PSV REFLC 13MM (MARKER) ×14 IMPLANT
NDL SAFETY ECLIP 18X1.5 (MISCELLANEOUS) ×2 IMPLANT
NS IRRIG 1000ML POUR BTL (IV SOLUTION) ×2 IMPLANT
PACK LAMINECTOMY NEURO (CUSTOM PROCEDURE TRAY) ×2 IMPLANT
PAD ARMBOARD 7.5X6 YLW CONV (MISCELLANEOUS) ×2 IMPLANT
ROD SPINE CURVE 5.5X50 (Rod) IMPLANT
SCREW CREO 6.5X45 FEN (Screw) IMPLANT
SCREW CREO MIS 30 TULIP (Screw) IMPLANT
SOLUTION IRRIG SURGIPHOR (IV SOLUTION) ×4 IMPLANT
SPONGE GAUZE 2X2 8PLY STRL LF (GAUZE/BANDAGES/DRESSINGS) ×2 IMPLANT
STAPLER SKIN PROX 35W (STAPLE) IMPLANT
SURGIFLO W/THROMBIN 8M KIT (HEMOSTASIS) ×2 IMPLANT
SUT DVC VLOC 3-0 CL 6 P-12 (SUTURE) ×2 IMPLANT
SUT ETHILON 3-0 FS-10 30 BLK (SUTURE) ×2
SUT VIC AB 0 CT1 27 (SUTURE) ×2
SUT VIC AB 0 CT1 27XCR 8 STRN (SUTURE) ×2 IMPLANT
SUT VIC AB 2-0 CT1 18 (SUTURE) ×2 IMPLANT
SUTURE EHLN 3-0 FS-10 30 BLK (SUTURE) IMPLANT
SYR 10ML LL (SYRINGE) ×4 IMPLANT
SYR 30ML LL (SYRINGE) ×4 IMPLANT
SYR 3ML LL SCALE MARK (SYRINGE) ×2 IMPLANT
TOWEL OR 17X26 4PK STRL BLUE (TOWEL DISPOSABLE) ×8 IMPLANT
TRAP FLUID SMOKE EVACUATOR (MISCELLANEOUS) ×2 IMPLANT
TRAY FOLEY SLVR 16FR LF STAT (SET/KITS/TRAYS/PACK) IMPLANT
TROCAR INSERT W/PEDICLE NDL (TROCAR) IMPLANT
TROCAR INSERT W/PEDICLE NDLE (TROCAR)
TUBING CONNECTING 10 (TUBING) ×4 IMPLANT
WATER STERILE IRR 1000ML POUR (IV SOLUTION) ×4 IMPLANT
WATER STERILE IRR 500ML POUR (IV SOLUTION) IMPLANT

## 2022-05-12 NOTE — Op Note (Signed)
Indications: 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   Preoperative Note:   Risks of surgery discussed include: infection, bleeding, stroke, coma, death, paralysis, CSF leak, nerve/spinal cord injury, numbness, tingling, weakness, complex regional pain syndrome, recurrent stenosis and/or disc herniation, vascular injury, development of instability, neck/back pain, need for further surgery, persistent symptoms, development of deformity, and the risks of anesthesia. The patient understood these risks and agreed to proceed.  Operative Note:  1. Transforaminal Lumbar Interbody Fusion L4/5 2. Posterolateral arthrodesis L4 to L5 3. Posterior nonsegmental instrumentation L4 to L5 using Globus Creo 4. Use of stereotaxis ([facility]) 5. Harvesting of autograft via the same incision 6. Placement of a biomechanical device ([place]) at L4/5 for anterior arthrodesis 7. R L5/S1 microdiscectomy  The patient was brought to the Operating Room, intubated and turned into the prone position. All pressure points were checked and double checked. The patient was prepped and draped in the standard fashion. A full timeout was performed. Preoperative antibiotics were given.   After draping, the stereotactic array was placed.  Stereotactic imaging was acquired and registered to the [place] system.  The image guidance system was used to  choose bilateral [place] incisions. The incisions were injected with local anesthetic.  Each incision was opened with a scalpel, bovie electrocautery was used to open the fascia.  Using stereotactic guidance, each pedicle from L4-L5was cannulated and K wire secured.  We then placed pedicle screws over each K wire. 6.5x20m Globus Creo screws were used bilaterally at each level.  After placement of pedicle screws, we then turned our attention to transforaminal lumbar interbody fusion.  A screw-based retractor was advanced over the right L4/5 facet. The microscope was brought into the field.  The right L4/5 facet was removed with osteotomes and the drill, and handed off for preparation as autograft. The traversing and exiting nerve roots on the right were identified and protected. The disc was opened using a scalpel. After incising the disc space, we took a combination of pituitary rongeurs, Kerrison rongeurs, disc scrapers, and curettes to remove a majority of the disc material.  We prepared the end plates for accepting the interbody fusion.  We removed the cartilaginous plate, preserved the cortical endplate if possible during this procedure.  The disc space was irrigated.  The Globus Sable biomechanical device was inserted, then backfilled with a mixture of allograft and autograft. During placement, the nerve roots and dural sac were carefully protected without any leaks identified. After placement of the device, the canal was fully inspected and all nerve roots were checked to ensure full decompression.  The retractor was removed.  Rods measured and placed. The rods were secured using locking caps to manufacturer's specifications. CT scan was taken to confirm instrumentation placement. The wound was copiously irrigated, then the posterior elements were prepared for arthrodesis.  A mixture of allograft and autograft was placed over the decorticated surfaces for arthrodesis.   After performing the TLIF at  L4-5, the metrx tubes  were sequentially advanced and confirmed in position at L5-S1. An 49m by 687mtube was locked in place to the bed side attachment.  Fluoroscopy was then removed from the field.  The microscope was then sterilely brought into the field and muscle creep was hemostased with a bipolar and resected with a pituitary rongeur.  A Bovie extender was then used to expose the spinous process and lamina.  Careful attention was placed to not violate the facet capsule. A 3 mm matchstick drill bit was then used to make a hemi-laminotomy trough until the ligamentum flavum was exposed.  This was extended to the base of the spinous process and to the contralateral side to remove all the central bone from each side.  Once this was complete and the underlying ligamentum flavum was visualized, it was dissected with a curette and resected with Kerrison rongeurs.  The ligamentum was removed.  The dura was identified and palpated. The kerrison rongeur was then used to remove the medial facet until no compression was noted.  The disc herniation was identified, palpated, and resected. A balltip probe was used to confirm decompression of the ipsilateral S1 nerve root.  No CSF leak was noted.  The wound was copiously irrigated. The tube system was then removed under microscopic visualization and hemostasis was obtained with a bipolar.     A subfascial drain was placed on the right.  After hemostasis, the wound was closed in layers with 0 and 2-0 vicryl. 3-0 monocryl and dermabond was applied to the incision. A sterile dressing was placed.  The patient was then flipped supine and extubated with incident. All counts were correct times 2 at the end of the case. No immediate complications were noted.  StGeronimo BootA assisted in the entire procedure.An assistant was required for this procedure due to the complexity.  The assistant provided assistance in tissue manipulation and suction, and was required for the  successful and safe performance of the procedure. I performed the critical portions of the procedure.   Isaac Weber

## 2022-05-12 NOTE — Interval H&P Note (Signed)
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  The various methods of treatment have been discussed with the patient and family. After consideration of risks, benefits and other options for treatment, the patient has consented to  Procedure(s): RIGHT L4-5 MINIMALLY INVASIVE (MIS) TRANSFORAMINAL LUMBAR INTERBODY FUSION (TLIF) (Right) RIGHT L5-S1 DISCECTOMY (Right) APPLICATION OF INTRAOPERATIVE CT SCAN (N/A) as a surgical intervention.  The patient's history has been reviewed, patient examined, no change in status, stable for surgery.  I have reviewed the patient's chart and labs.  Questions were answered to the patient's satisfaction.    Heart sounds normal no MRG. Chest Clear to Auscultation Bilaterally.   Griffin Gerrard

## 2022-05-12 NOTE — Anesthesia Procedure Notes (Signed)
Procedure Name: Intubation Date/Time: 05/12/2022 9:43 AM  Performed by: Debe Coder, CRNAPre-anesthesia Checklist: Patient identified, Emergency Drugs available, Suction available and Patient being monitored Patient Re-evaluated:Patient Re-evaluated prior to induction Oxygen Delivery Method: Circle system utilized Preoxygenation: Pre-oxygenation with 100% oxygen Induction Type: IV induction Ventilation: Mask ventilation without difficulty Laryngoscope Size: McGraph and 4 Grade View: Grade I Tube type: Oral Tube size: 7.5 mm Number of attempts: 1 Airway Equipment and Method: Stylet and Oral airway Placement Confirmation: ETT inserted through vocal cords under direct vision, positive ETCO2 and breath sounds checked- equal and bilateral Secured at: 21 cm Tube secured with: Tape Dental Injury: Teeth and Oropharynx as per pre-operative assessment

## 2022-05-12 NOTE — Anesthesia Preprocedure Evaluation (Addendum)
Anesthesia Evaluation  Patient identified by MRN, date of birth, ID band Patient awake    Reviewed: Allergy & Precautions, NPO status , Patient's Chart, lab work & pertinent test results  History of Anesthesia Complications Negative for: history of anesthetic complications  Airway Mallampati: II   Neck ROM: Full    Dental  (+) Upper Dentures, Lower Dentures   Pulmonary asthma , COPD, former smoker (quit 1973),    Pulmonary exam normal breath sounds clear to auscultation       Cardiovascular hypertension, + CAD  Normal cardiovascular exam Rhythm:Regular Rate:Normal  ECG 05/03/22:  Normal sinus rhythm 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 Coronary calcification: No angina.  Walks regularly. Continue aggressive secondary prevention.   2. HTN: The current medical regimen is effective;  continue present plan and medications.Repeat HR 96. 3. DM: A1C 6.8- went to 7.0 while on steroid.  4. Hyperlipidemia: LDL 51. Continue rosuvastatin.  Await 2022 lipids from this AM.   Current medicines are reviewed at length with the patient today.  The patient concerns regarding his medicines were addressed.  The following changes have been made:  No change  Labs/ tests ordered today include:  No orders of the defined types were placed in this encounter.  Recommend 150 minutes/week of aerobic exercise Low fat, low carb, high fiber diet recommended  Disposition:   FU in 1 year  Reproductive/Obstetrics                             Anesthesia Physical Anesthesia Plan  ASA: 3  Anesthesia Plan: General   Post-op Pain Management:    Induction: Intravenous  PONV Risk Score and Plan: 2 and Ondansetron, Dexamethasone and Treatment may vary due to age or medical condition  Airway Management Planned: Oral ETT  Additional Equipment:   Intra-op Plan:   Post-operative Plan: Extubation in OR  Informed Consent: I have reviewed the patients History and Physical, chart, labs and discussed the procedure including the risks, benefits and alternatives for the proposed anesthesia with the patient or authorized representative who has indicated his/her understanding and acceptance.     Dental advisory given  Plan Discussed with: CRNA  Anesthesia Plan Comments: (Patient consented for risks of anesthesia including but not limited to:  - adverse reactions to medications - damage to eyes, teeth, lips or other oral mucosa - nerve damage due to positioning  - sore throat or hoarseness - damage to heart, brain, nerves, lungs, other parts of body or loss of life  Informed patient about role of CRNA in peri- and intra-operative care.  Patient voiced understanding.)        Anesthesia Quick Evaluation

## 2022-05-12 NOTE — Transfer of Care (Signed)
Immediate Anesthesia Transfer of Care Note  Patient: [name]  Procedure(s) Performed: RIGHT L4-5 MINIMALLY INVASIVE (MIS) TRANSFORAMINAL LUMBAR INTERBODY FUSION (TLIF) (Right: Spine Lumbar) RIGHT L5-S1 DISCECTOMY (Right: Spine Lumbar) APPLICATION OF INTRAOPERATIVE CT SCAN (Spine Lumbar)  Patient Location: PACU  Anesthesia Type:General  Level of Consciousness: awake and drowsy  Airway & Oxygen Therapy: Patient Spontanous Breathing  Post-op Assessment: Report given to RN and Post -op Vital signs reviewed and stable  Post vital signs: Reviewed and stable  Last Vitals:  Vitals Value Taken Time  BP 114/65 05/12/22 1309  Temp    Pulse 107 05/12/22 1315  Resp 15 05/12/22 1315  SpO2 98 % 05/12/22 1315  Vitals shown include unvalidated device data.  Last Pain:  Vitals:   05/12/22 0836  TempSrc: Temporal  PainSc: 0-No pain         Complications: No notable events documented.

## 2022-05-12 NOTE — Anesthesia Postprocedure Evaluation (Signed)
Anesthesia Post Note  Patient: Isaac Weber  Procedure(s) Performed: RIGHT L4-5 MINIMALLY INVASIVE (MIS) TRANSFORAMINAL LUMBAR INTERBODY FUSION (TLIF) (Right: Spine Lumbar) RIGHT L5-S1 DISCECTOMY (Right: Spine Lumbar) APPLICATION OF INTRAOPERATIVE CT SCAN (Spine Lumbar)  Patient location during evaluation: PACU Anesthesia Type: General Level of consciousness: awake and alert, oriented and patient cooperative Pain management: pain level controlled Vital Signs Assessment: post-procedure vital signs reviewed and stable Respiratory status: spontaneous breathing, nonlabored ventilation and respiratory function stable Cardiovascular status: blood pressure returned to baseline and stable Postop Assessment: adequate PO intake Anesthetic complications: no   No notable events documented.   Last Vitals:  Vitals:   05/12/22 1400 05/12/22 1415  BP: 99/65 (!) 101/57  Pulse: 96 95  Resp: 17 (!) 21  Temp:    SpO2: 99% 100%    Last Pain:  Vitals:   05/12/22 1415  TempSrc:   PainSc: [place]

## 2022-05-13 ENCOUNTER — Encounter: Payer: Self-pay | Admitting: Neurosurgery

## 2022-05-13 LAB — GLUCOSE, CAPILLARY
Glucose-Capillary: 170 mg/dL — ABNORMAL HIGH (ref 70–99)
Glucose-Capillary: 290 mg/dL — ABNORMAL HIGH (ref 70–99)
Glucose-Capillary: 193 mg/dL — ABNORMAL HIGH (ref 70–99)
Glucose-Capillary: 186 mg/dL — ABNORMAL HIGH (ref 70–99)

## 2022-05-13 MED ORDER — CELECOXIB 200 MG PO CAPS
200.0000 mg | ORAL_CAPSULE | Freq: Two times a day (BID) | ORAL | Status: DC
  Administered 2022-05-14: 200 mg via ORAL
  Filled 2022-05-13: qty 1

## 2022-05-13 NOTE — Progress Notes (Signed)
Orthopedic Tech Progress Note Patient Details:  Isaac Weber 09-17-1945 [id number]  I opened this pt's chart mistakenly in an attempt to place an order for a separate pt on this floor.   Patient ID: Isaac Weber, male   DOB: Nov 25, 1945, 76 y.o.   MRN: [id number]  [name] 05/13/2022, 10:14 AM

## 2022-05-13 NOTE — Progress Notes (Signed)
    Attending Progress Note  History: Isaac Weber is here for anterolisthesis L4 on L5.  POD1: Doing well. Pain is reasonable. Leg pain is gone.  Physical Exam: Vitals:   05/13/22 0444 05/13/22 0736  BP: 129/72 120/67  Pulse: 94 95  Resp: 17   Temp: 97.9 F (36.6 C) 98 F (36.7 C)  SpO2: 95% 98%    AA Ox3 CNI  Strength:5/5 throughout BLE  Data:  Other tests/results: n/a  Drain 120  Assessment/Plan:  KAMALI NEPHEW is doing well after TLIF.   - mobilize - pain control - DVT prophylaxis - PTOT   Meade Maw MD, [facility] Department of Neurosurgery

## 2022-05-13 NOTE — Progress Notes (Addendum)
Occupational Therapy Treatment Patient Details Name: Isaac Weber MRN: [id number] DOB: Apr 23, 1946 Today's Date: 05/13/2022   History of present illness 76yo male s/p TLIF L4/5 on 05/12/22.   OT comments  Pt seen for 2nd tx session this morning to address pt/family questions regarding AE. Pt instructed in AE for LB dressing, specifically reacher and sock aide. Pt able to return demo good technique using sock aide to don socks with mod indep after initial instruction. Educated pt on where to purchase and reviewed additional AE that may be helpful including LH sponge for LB bathing. Pt verbalized understanding. No additional skilled OT needs at this time. Will sign off.     Recommendations for follow up therapy are one component of a multi-disciplinary discharge planning process, led by the attending physician.  Recommendations may be updated based on patient status, additional functional criteria and insurance authorization.    Follow Up Recommendations  No OT follow up    Assistance Recommended at Discharge PRN  Patient can return home with the following  A little help with bathing/dressing/bathroom;Assistance with cooking/housework;Assist for transportation;Help with stairs or ramp for entrance   Equipment Recommendations  Other (comment) (reacher, sock aide, LH sponge)    Recommendations for Other Services      Precautions / Restrictions Precautions Precautions: Back;Fall Precaution Booklet Issued: Yes (comment) Restrictions Weight Bearing Restrictions: No       Mobility Bed Mobility               General bed mobility comments: NT, up in recliner    Transfers Overall transfer level: Needs assistance Equipment used: None Transfers: Sit to/from Stand Sit to Stand: Supervision           General transfer comment: VC for foot placement to improve technique/comfort     Balance Overall balance assessment: Mild deficits observed, not formally tested                                          ADL either performed or assessed with clinical judgement   ADL Overall ADL's : Modified independent                                       General ADL Comments: Pt instructed in AE for LB dressing, specifically reacher and sock aide. Pt able to return demo good technique using sock aide to don socks with mod indep after initial instruction. Educated pt on where to purchase and reviewed additional AE that may be helpful.    Extremity/Trunk Assessment Upper Extremity Assessment Upper Extremity Assessment: RUE deficits/detail RUE Deficits / Details: hx ROM deficits 2/2 arthritis per pt report, otherwise [facility]   Lower Extremity Assessment Lower Extremity Assessment: Defer to PT evaluation (pt denies leg pain after surgery)   Cervical / Trunk Assessment Cervical / Trunk Assessment: Back Surgery    Vision       Perception     Praxis      Cognition Arousal/Alertness: Awake/alert Behavior During Therapy: WFL for tasks assessed/performed Overall Cognitive Status: Within Functional Limits for tasks assessed  Exercises Other Exercises Other Exercises: Pt educated in home/routines modifications, falls prevention, AE/DME, back precautions and how to maintain, log roll for bed mobility, and ADL transfers; handout provided    Shoulder Instructions       General Comments      Pertinent Vitals/ Pain       Pain Assessment Pain Assessment: No/denies pain Pain Score: 4  Pain Location: incision Pain Descriptors / Indicators: Aching Pain Intervention(s): Limited activity within patient's tolerance, Monitored during session, Premedicated before session, Repositioned  Home Living Family/patient expects to be discharged to:: Private residence Living Arrangements: Spouse/significant other Available Help at Discharge: Family;Available 24 hours/day Type of Home:  House Home Access: Stairs to enter [employer] of Steps: 1   Home Layout: One level     Bathroom Shower/Tub: Walk-in shower;Door   [employer] Toilet: Handicapped height     Home Equipment: Research scientist (life sciences);Shower seat - built in [employer] Equipment: Reacher        Prior Functioning/Environment              Frequency  Min 1X/week        Progress Toward Goals  OT Goals(current goals can now be found in the care plan section)  Progress towards OT goals: Goals met/education completed, patient discharged from OT  Acute Rehab OT Goals Patient Stated Goal: get better OT Goal Formulation: All assessment and education complete, DC therapy Time For Goal Achievement: 05/27/22 Potential to Achieve Goals: Good ADL Goals Pt Will Perform Lower Body Dressing: with modified independence;with adaptive equipment;sit to/from stand Additional ADL Goal #1: Pt will verbalize 100% of back precautions and how to maintain during ADL/mobility.  Plan All goals met and education completed, patient discharged from OT services    Co-evaluation                 AM-PAC OT "6 Clicks" Daily Activity     Outcome Measure   Help from another person eating meals?: None Help from another person taking care of personal grooming?: None Help from another person toileting, which includes using toliet, bedpan, or urinal?: None Help from another person bathing (including washing, rinsing, drying)?: A Little Help from another person to put on and taking off regular upper body clothing?: None Help from another person to put on and taking off regular lower body clothing?: A Little 6 Click Score: 22    End of Session Equipment Utilized During Treatment: Gait belt;Rolling walker (2 wheels)  OT Visit Diagnosis: Other abnormalities of gait and mobility (R26.89)   Activity Tolerance Patient tolerated treatment well   Patient Left in chair;with call bell/phone within reach;with  family/visitor present   Nurse Communication          Time: 8882-8003 OT Time Calculation (min): 10 min  Charges: OT General Charges $OT Visit: 1 Visit OT Treatments $Self Care/Home Management : 8-22 mins  Ardeth Perfect., MPH, MS, OTR/L ascom [phone] 05/13/22, 11:06 AM

## 2022-05-13 NOTE — Evaluation (Signed)
Physical Therapy Evaluation Patient Details Name: Isaac Weber MRN: [id number] DOB: [birth year] Today's Date: 05/13/2022  History of Present Illness  76yo male s/p TLIF L4/5 on 05/12/22.  Clinical Impression  Pt is a pleasant 76 year old male who was admitted for TLIF L4/5 and is currently POD 1 at time of evaluation. Pt performs transfers and ambulation with supervision and no AD. Also performed stair training. Pt demonstrates deficits with pain/mobility. Educated in back precautions with good recall from previous OT session. Pt does not require any further acute PT needs at this time. Pt will be dc in house. Recommend follow up post hospital stay. RN aware. Will dc current orders.      Recommendations for follow up therapy are one component of a multi-disciplinary discharge planning process, led by the attending physician.  Recommendations may be updated based on patient status, additional functional criteria and insurance authorization.  Follow Up Recommendations Follow physician's recommendations for discharge plan and follow up therapies      Assistance Recommended at Discharge Set up Supervision/Assistance  Patient can return home with the following       Equipment Recommendations None recommended by PT  Recommendations for Other Services       Functional Status Assessment Patient has not had a recent decline in their functional status     Precautions / Restrictions Precautions Precautions: Back;Fall Precaution Booklet Issued: Yes (comment) Restrictions Weight Bearing Restrictions: No      Mobility  Bed Mobility               General bed mobility comments: NT, up in recliner    Transfers Overall transfer level: Needs assistance Equipment used: None Transfers: Sit to/from Stand Sit to Stand: Supervision           General transfer comment: cues for sequencing to adhere to back precautions. Once standing, upright posture noted     Ambulation/Gait Ambulation/Gait assistance: Supervision Gait Distance (Feet): 200 Feet Assistive device: None Gait Pattern/deviations: WFL(Within Functional Limits)       General Gait Details: ambulated in hallway without AD demonstrating slow gait speed. Safe technique with no LOB and minimal pain.  Stairs Stairs: Yes Stairs assistance: Min guard Stair Management: No rails, Two rails, Forwards Number of Stairs: 5 General stair comments: Pt ambulated 1 step without railing with cga to simulate enterance to home and then ambulated 4 additional steps with B rails to simulate community. Safe technique with education given prior to performance  Wheelchair Mobility    Modified Rankin (Stroke Patients Only)       Balance Overall balance assessment: Modified Independent                                           Pertinent Vitals/Pain Pain Assessment Pain Assessment: Faces Faces Pain Scale: Hurts little more Pain Location: incision Pain Descriptors / Indicators: Aching, Operative site guarding Pain Intervention(s): Limited activity within patient's tolerance    Home Living Family/patient expects to be discharged to:: Private residence Living Arrangements: Spouse/significant other Available Help at Discharge: Family;Available 24 hours/day Type of Home: House Home Access: Stairs to enter Entrance Stairs-Rails: None Entrance Stairs-Number of Steps: 1   Home Layout: One level Home Equipment: Adaptive equipment;Shower seat - built in      Prior Function Prior Level of Function : Independent/Modified Independent  Mobility Comments: amb with SPC past 81mo2/2 leg pain ADLs Comments: indep, driving infrequently 2/2 R shoulder ROM deficits. No reports of recent falls     Hand Dominance        Extremity/Trunk Assessment   Upper Extremity Assessment Upper Extremity Assessment: Defer to OT evaluation RUE Deficits / Details: hx ROM  deficits 2/2 arthritis [facility] pt report, [facility]   Lower Extremity Assessment Lower Extremity Assessment: Generalized weakness (R LE grossly 4+/5; L LE grossly 4/5)    Cervical / Trunk Assessment Cervical / Trunk Assessment: Back Surgery  Communication   Communication: No difficulties  Cognition Arousal/Alertness: Awake/alert Behavior During Therapy: WFL for tasks assessed/performed Overall Cognitive Status: Within Functional Limits for tasks assessed                                          General Comments      Exercises     Assessment/Plan    PT Assessment All further PT needs can be met in the next venue of care  PT Problem List Decreased strength;Decreased mobility;Decreased knowledge of precautions;Pain       PT Treatment Interventions      PT Goals (Current goals can be found in the Care Plan section)  Acute Rehab PT Goals Patient Stated Goal: to go home PT Goal Formulation: All assessment and education complete, DC therapy Time For Goal Achievement: 05/13/22 Potential to Achieve Goals: Good    Frequency       Co-evaluation               AM-PAC PT "6 Clicks" Mobility  Outcome Measure Help needed turning from your back to your side while in a flat bed without using bedrails?: None Help needed moving from lying on your back to sitting on the side of a flat bed without using bedrails?: None Help needed moving to and from a bed to a chair (including a wheelchair)?: A Little Help needed standing up from a chair using your arms (e.g., wheelchair or bedside chair)?: A Little Help needed to walk in hospital room?: A Little Help needed climbing 3-5 steps with a railing? : A Little 6 Click Score: 20    End of Session Equipment Utilized During Treatment: Gait belt Activity Tolerance: Patient tolerated treatment well Patient left: in chair Nurse Communication: Mobility status PT Visit Diagnosis: Muscle weakness (generalized)  (M62.81);Pain Pain - Right/Left:  (central) Pain - part of body:  (back)    Time: [address]-3267PT Time Calculation (min) (ACUTE ONLY): 16 min   Charges:   PT Evaluation $PT Eval Low Complexity: 1 Low PT Treatments $Gait Training: 8-22 mins        SGreggory Stallion PT, DPT, GCS [phone]  Leray Garverick 05/13/2022, 2:21 PM

## 2022-05-13 NOTE — TOC Progression Note (Signed)
Transition of Care [facility]) - Progression Note    Patient Details  Name: Isaac Weber MRN: [id number] Date of Birth: 12-25-1945  Transition of Care [facility]) CM/SW [place], RN Phone Number: 05/13/2022, 8:45 AM  Clinical Narrative:     The patient is set up with [facility] for [facility] services prior to surgery by surgeons office       Expected Discharge Plan and Services                                                 Social Determinants of Health (SDOH) Interventions    Readmission Risk Interventions     No data to display

## 2022-05-13 NOTE — Evaluation (Signed)
Occupational Therapy Evaluation Patient Details Name: Isaac Weber MRN: [id number] DOB: [birth year] Today's Date: 05/13/2022   History of Present Illness 76yo male s/p TLIF L4/5 on 05/12/22.   Clinical Impression   Pt seen for OT evaluation this date, POD#1 from the above surgery. Prior to hospital admission, pt was independent with ADL and most IADL (limited driving 2/2 hx R shoulder ROM deficits). He has been using a SPC for mobility for the past 53mo2/2 pain. No falls in past 6 months. Pt lives with spouse in a single family home with 1 step to enter and no handrails with spouse able to provide 24/7 assist/support as needed for pt. Currently pt completes ADL transfers with supervision with/without RW, ambulates with RW and supv, and requires MIN A for LB ADL tasks in order to maintain precautions. Pt educated in back precautions with handout provided, self care skills, bed mobility and functional transfer training, AE/DME for bathing, dressing, and toileting needs, and home/routines modifications and falls prevention strategies to maximize safety and functional independence while minimizing falls risk and maintaining precautions. Pt verbalized understanding of all education/training provided. Handout provided to support recall and carry over of learned precautions/techniques for bed mobility, functional transfers, and self care skills. Pt will benefit from 1 additional OT tx session to ensure carryover of learned instruction in order to maximize return to PLOF.     Recommendations for follow up therapy are one component of a multi-disciplinary discharge planning process, led by the attending physician.  Recommendations may be updated based on patient status, additional functional criteria and insurance authorization.   Follow Up Recommendations  No OT follow up    Assistance Recommended at Discharge PRN  Patient can return home with the following A little help with  bathing/dressing/bathroom;Assistance with cooking/housework;Assist for transportation;Help with stairs or ramp for entrance    Functional Status Assessment  Patient has had a recent decline in their functional status and demonstrates the ability to make significant improvements in function in a reasonable and predictable amount of time.  Equipment Recommendations  Other (comment) (reacher, sock aide)    Recommendations for Other Services       Precautions / Restrictions Precautions Precautions: Back;Fall Precaution Booklet Issued: Yes (comment) Restrictions Weight Bearing Restrictions: No      Mobility Bed Mobility               General bed mobility comments: NT, up in recliner    Transfers Overall transfer level: Needs assistance Equipment used: None Transfers: Sit to/from Stand Sit to Stand: Supervision           General transfer comment: VC for foot placement to improve technique/comfort      Balance Overall balance assessment: Mild deficits observed, not formally tested                                         ADL either performed or assessed with clinical judgement   ADL                                         General ADL Comments: pt currently requires MIN A for LB ADL tasks in order to maintain back precautions; spouse able to assist; supv for ADL mobility     Vision  Perception     Praxis      Pertinent Vitals/Pain Pain Assessment Pain Assessment: 0-10 Pain Score: 4  Pain Location: incision Pain Descriptors / Indicators: Aching Pain Intervention(s): Limited activity within patient's tolerance, Monitored during session, Premedicated before session, Repositioned     Hand Dominance     Extremity/Trunk Assessment Upper Extremity Assessment Upper Extremity Assessment: RUE deficits/detail RUE Deficits / Details: hx ROM deficits 2/2 arthritis per pt report, otherwise [facility]   Lower Extremity  Assessment Lower Extremity Assessment: Defer to PT evaluation (pt denies leg pain after surgery)   Cervical / Trunk Assessment Cervical / Trunk Assessment: Back Surgery   Communication Communication Communication: No difficulties   Cognition Arousal/Alertness: Awake/alert Behavior During Therapy: WFL for tasks assessed/performed Overall Cognitive Status: Within Functional Limits for tasks assessed                                       General Comments       Exercises Other Exercises Other Exercises: Pt educated in home/routines modifications, falls prevention, AE/DME, back precautions and how to maintain, log roll for bed mobility, and ADL transfers; handout provided   Shoulder Instructions      Home Living Family/patient expects to be discharged to:: Private residence Living Arrangements: Spouse/significant other Available Help at Discharge: Family;Available 24 hours/day Type of Home: House Home Access: Stairs to enter [employer] of Steps: 1   Home Layout: One level     Bathroom Shower/Tub: Walk-in shower;Door   [employer] Toilet: Handicapped height     Home Equipment: Research scientist (life sciences);Shower seat - built in [employer]: Reacher        Prior Functioning/Environment Prior Level of Function : Independent/Modified Independent             Mobility Comments: amb with SPC past 83mo2/2 leg pain ADLs Comments: indep, driving infrequently 2/2 R shoulder ROM deficits        OT Problem List: Decreased strength;Decreased range of motion      OT Treatment/Interventions: Self-care/ADL training;Therapeutic activities;DME and/or AE instruction;Patient/family education    OT Goals(Current goals can be found in the care plan section) Acute Rehab OT Goals Patient Stated Goal: get better OT Goal Formulation: With patient Time For Goal Achievement: 05/27/22 Potential to Achieve Goals: Good ADL Goals Pt Will Perform Lower Body  Dressing: with modified independence;with adaptive equipment;sit to/from stand Additional ADL Goal #1: Pt will verbalize 100% of back precautions and how to maintain during ADL/mobility.  OT Frequency: Min 1X/week    Co-evaluation              AM-PAC OT "6 Clicks" Daily Activity     Outcome Measure Help from another person eating meals?: None Help from another person taking care of personal grooming?: None Help from another person toileting, which includes using toliet, bedpan, or urinal?: None Help from another person bathing (including washing, rinsing, drying)?: A Little Help from another person to put on and taking off regular upper body clothing?: None Help from another person to put on and taking off regular lower body clothing?: A Little 6 Click Score: 22   End of Session Equipment Utilized During Treatment: Gait belt;Rolling walker (2 wheels)  Activity Tolerance: Patient tolerated treatment well Patient left: in chair;with call bell/phone within reach  OT Visit Diagnosis: Other abnormalities of gait and mobility (R26.89)  Time: 5183-3582 OT Time Calculation (min): 25 min Charges:  OT General Charges $OT Visit: 1 Visit OT Evaluation $OT Eval Low Complexity: 1 Low OT Treatments $Self Care/Home Management : 8-22 mins  Ardeth Perfect., MPH, MS, OTR/L ascom [phone] 05/13/22, 11:02 AM

## 2022-05-13 NOTE — Care Management Important Message (Signed)
Important Message  Patient Details  Name: Isaac Weber MRN: [id number] Date of Birth: 1946-05-18   Medicare Important Message Given:  N/A - LOS <3 / Initial given by admissions     [name] 05/13/2022, 6:24 PM

## 2022-05-13 NOTE — Plan of Care (Signed)
  Problem: Education: Goal: Ability to verbalize activity precautions or restrictions will improve Outcome: Progressing Goal: Knowledge of the prescribed therapeutic regimen will improve Outcome: Progressing Goal: Understanding of discharge needs will improve Outcome: Progressing   Problem: Activity: Goal: Ability to avoid complications of mobility impairment will improve Outcome: Progressing Goal: Ability to tolerate increased activity will improve Outcome: Progressing Goal: Will remain free from falls Outcome: Progressing   Problem: Bowel/Gastric: Goal: Gastrointestinal status for postoperative course will improve Outcome: Progressing   Problem: Clinical Measurements: Goal: Ability to maintain clinical measurements within normal limits will improve Outcome: Progressing Goal: Postoperative complications will be avoided or minimized Outcome: Progressing Goal: Diagnostic test results will improve Outcome: Progressing   Problem: Pain Management: Goal: Pain level will decrease Outcome: Progressing   Problem: Skin Integrity: Goal: Will show signs of wound healing Outcome: Progressing   Problem: Health Behavior/Discharge Planning: Goal: Identification of resources available to assist in meeting health care needs will improve Outcome: Progressing   Problem: Bladder/Genitourinary: Goal: Urinary functional status for postoperative course will improve Outcome: Progressing   Problem: Education: Goal: Ability to describe self-care measures that may prevent or decrease complications (Diabetes Survival Skills Education) will improve Outcome: Progressing Goal: Individualized Educational Video(s) Outcome: Progressing   Problem: Coping: Goal: Ability to adjust to condition or change in health will improve Outcome: Progressing   Problem: Fluid Volume: Goal: Ability to maintain a balanced intake and output will improve Outcome: Progressing   Problem: Health Behavior/Discharge  Planning: Goal: Ability to identify and utilize available resources and services will improve Outcome: Progressing Goal: Ability to manage health-related needs will improve Outcome: Progressing   Problem: Metabolic: Goal: Ability to maintain appropriate glucose levels will improve Outcome: Progressing   Problem: Nutritional: Goal: Maintenance of adequate nutrition will improve Outcome: Progressing Goal: Progress toward achieving an optimal weight will improve Outcome: Progressing   Problem: Skin Integrity: Goal: Risk for impaired skin integrity will decrease Outcome: Progressing   Problem: Tissue Perfusion: Goal: Adequacy of tissue perfusion will improve Outcome: Progressing   Problem: Education: Goal: Knowledge of General Education information will improve Description: Including pain rating scale, medication(s)/side effects and non-pharmacologic comfort measures Outcome: Progressing   Problem: Health Behavior/Discharge Planning: Goal: Ability to manage health-related needs will improve Outcome: Progressing   Problem: Clinical Measurements: Goal: Ability to maintain clinical measurements within normal limits will improve Outcome: Progressing Goal: Will remain free from infection Outcome: Progressing Goal: Diagnostic test results will improve Outcome: Progressing Goal: Respiratory complications will improve Outcome: Progressing Goal: Cardiovascular complication will be avoided Outcome: Progressing   Problem: Activity: Goal: Risk for activity intolerance will decrease Outcome: Progressing   Problem: Nutrition: Goal: Adequate nutrition will be maintained Outcome: Progressing   Problem: Coping: Goal: Level of anxiety will decrease Outcome: Progressing   Problem: Elimination: Goal: Will not experience complications related to bowel motility Outcome: Progressing Goal: Will not experience complications related to urinary retention Outcome: Progressing    Problem: Pain Managment: Goal: General experience of comfort will improve Outcome: Progressing   Problem: Safety: Goal: Ability to remain free from injury will improve Outcome: Progressing   Problem: Skin Integrity: Goal: Risk for impaired skin integrity will decrease Outcome: Progressing   

## 2022-05-14 LAB — BASIC METABOLIC PANEL
Anion gap: 5 (ref 5–15)
BUN: 20 mg/dL (ref 8–23)
CO2: 25 mmol/L (ref 22–32)
Calcium: 8.4 mg/dL — ABNORMAL LOW (ref 8.9–10.3)
Chloride: 102 mmol/L (ref 98–111)
Creatinine, Ser: 0.57 mg/dL — ABNORMAL LOW (ref 0.61–1.24)
GFR, Estimated: >60 mL/min (ref >60)
Glucose, Bld: 167 mg/dL — ABNORMAL HIGH (ref 70–99)
Potassium: 3.9 mmol/L (ref 3.5–5.1)
Sodium: 132 mmol/L — ABNORMAL LOW (ref 135–145)

## 2022-05-14 LAB — GLUCOSE, CAPILLARY
Glucose-Capillary: 131 mg/dL — ABNORMAL HIGH (ref 70–99)
Glucose-Capillary: 171 mg/dL — ABNORMAL HIGH (ref 70–99)

## 2022-05-14 MED ORDER — METHOCARBAMOL 500 MG PO TABS: 500.0000 mg | ORAL_TABLET | Freq: Four times a day (QID) | ORAL | 0 refills | Status: DC | PRN

## 2022-05-14 MED ORDER — OXYCODONE HCL 5 MG PO TABS: 5.0000 mg | ORAL_TABLET | ORAL | 0 refills | Status: AC | PRN

## 2022-05-14 MED ORDER — CELECOXIB 200 MG PO CAPS: 200.0000 mg | ORAL_CAPSULE | Freq: Two times a day (BID) | ORAL | 0 refills | Status: DC

## 2022-05-14 MED ORDER — SENNA 8.6 MG PO TABS: 1.0000 | ORAL_TABLET | Freq: Two times a day (BID) | ORAL | 0 refills | Status: DC

## 2022-05-14 NOTE — Discharge Instructions (Signed)
Your surgeon has performed an operation on your lumbar spine (low back) to relieve pressure on one or more nerves. Many times, patients feel better immediately after surgery and can "overdo it." Even if you feel well, it is important that you follow these activity guidelines. If you do not let your back heal properly from the surgery, you can increase the chance of a disc herniation and/or return of your symptoms. The following are instructions to help in your recovery once you have been discharged from the hospital.  * It is ok to take celebrex but not other NSAIDs after surgery.  Activity    No bending, lifting, or twisting ("BLT"). Avoid lifting objects heavier than 10 pounds (gallon milk jug).  Where possible, avoid household activities that involve lifting, bending, pushing, or pulling such as laundry, vacuuming, grocery shopping, and childcare. Try to arrange for help from friends and family for these activities while your back heals.  Increase physical activity slowly as tolerated.  Taking short walks is encouraged, but avoid strenuous exercise. Do not jog, run, bicycle, lift weights, or participate in any other exercises unless specifically allowed by your doctor. Avoid prolonged sitting, including car rides.  Talk to your doctor before resuming sexual activity.  You should not drive until cleared by your doctor.  Until released by your doctor, you should not return to work or school.  You should rest at home and let your body heal.   You may shower two days after your surgery.  After showering, lightly dab your incision dry. Do not take a tub bath or go swimming for 3 weeks, or until approved by your doctor at your follow-up appointment.  If you smoke, we strongly recommend that you quit.  Smoking has been proven to interfere with normal healing in your back and will dramatically reduce the success rate of your surgery. Please contact [facility] (800-QUIT-NOW) and use the resources at  www.[facility].com for assistance in stopping smoking.  Surgical Incision   If you have a dressing on your incision, you may remove it three days after your surgery (05/15/22). Keep your incision area clean and dry.  If you have staples or stitches on your incision, you should have a follow up scheduled for removal. If you do not have staples or stitches, you will have steri-strips (small pieces of surgical tape) or Dermabond glue. The steri-strips/glue should begin to peel away within about a week (it is fine if the steri-strips fall off before then). If the strips are still in place one week after your surgery, you may gently remove them.  Diet            You may return to your usual diet. Be sure to stay hydrated.  When to Contact us  Although your surgery and recovery will likely be uneventful, you may have some residual numbness, aches, and pains in your back and/or legs. This is normal and should improve in the next few weeks.  However, should you experience any of the following, contact us immediately: New numbness or weakness Pain that is progressively getting worse, and is not relieved by your pain medications or rest Bleeding, redness, swelling, pain, or drainage from surgical incision Chills or flu-like symptoms Fever greater than 101.0 F (38.3 C) Problems with bowel or bladder functions Difficulty breathing or shortness of breath Warmth, tenderness, or swelling in your calf  Contact Information During office hours (Monday-Friday 9 am to 5 pm), please call your physician at [phone] and ask for  Berdine Addison After hours and weekends, please call [phone] and speak with the neurosurgeon on call For a life-threatening emergency, call 911

## 2022-05-14 NOTE — Progress Notes (Signed)
Met with the patient and his wife in the room, he will need a rolling walker, [employer] will deliver to the bedside He is se tup with [facility] for HH Wife to provide with transportation 

## 2022-05-14 NOTE — Discharge Summary (Signed)
Physician Discharge Summary  Patient ID: Isaac Weber MRN: [id number] DOB/AGE: 1945/12/16 76 y.o.  Admit date: 05/12/2022 Discharge date: 05/14/2022  Admission Diagnoses:Principal Problem:   Spondylolisthesis of lumbar region Active Problems:   Spondylolisthesis at L4-L5 level   Chronic right-sided low back pain with right-sided sciatica   Other chronic pain  Discharge Diagnoses:  Principal Problem:   Spondylolisthesis of lumbar region Active Problems:   Spondylolisthesis at L4-L5 level   Chronic right-sided low back pain with right-sided sciatica   Other chronic pain   Discharged Condition: good  Hospital Course: Isaac Weber underwent surgical intervention for L4-5 anterolisthesis.  He did well with surgery and was stable for discharge on POD2.  Consults: None  Significant Diagnostic Studies: radiology: X-Ray: confirmation of screw placement.  Treatments: surgery: L4-5 TLIF, R L5/S1 decompression  Discharge Exam: Blood pressure 116/78, pulse 99, temperature 97.8 F (36.6 C), resp. rate 17, height '5\' 9"'  (1.753 m), weight 72.6 kg, SpO2 100 %. General appearance: alert and cooperative CNI MAEW Walking well   Disposition: Discharge disposition: 01-Home or Self Care       Discharge Instructions     Discharge patient   Complete by: As directed    Discharge disposition: 01-Home or Self Care   Discharge patient date: 05/14/2022   Incentive spirometry RT   Complete by: As directed       Allergies as of 05/14/2022       Reactions   Morphine And Related Diarrhea, Nausea And Vomiting        Medication List     STOP taking these medications    baclofen 10 MG tablet Commonly known as: LIORESAL   ciprofloxacin 500 MG tablet Commonly known as: CIPRO   potassium chloride 10 MEQ tablet Commonly known as: KLOR-CON M       TAKE these medications    albuterol 108 (90 Base) MCG/ACT inhaler Commonly known as: VENTOLIN HFA Inhale 1-2 puffs into the  lungs every 4 (four) hours as needed for wheezing or shortness of breath (cough).   amLODipine 5 MG tablet Commonly known as: NORVASC TAKE ONE TABLET BY MOUTH ONCE DAILY   CAL MAG ZINC +D3 PO Take 1 tablet by mouth every evening.   celecoxib 200 MG capsule Commonly known as: CELEBREX Take 1 capsule (200 mg total) by mouth every 12 (twelve) hours.   ferrous sulfate 325 (65 FE) MG tablet Take 325 mg by mouth daily with breakfast.   gabapentin 300 MG capsule Commonly known as: NEURONTIN   GLUCOSAMINE CHONDR COMPLEX PO Take 1 capsule by mouth 2 (two) times daily.   guaiFENesin 600 MG 12 hr tablet Commonly known as: MUCINEX Take 1,200 mg by mouth daily.   hydrochlorothiazide 12.5 MG tablet Commonly known as: HYDRODIURIL TAKE ONE TABLET BY MOUTH ONCE DAILY   ipratropium 0.06 % nasal spray Commonly known as: ATROVENT Place 2 sprays into both nostrils 4 (four) times daily. For up to 5-7 days then stop and use as needed.   ketoconazole 2 % shampoo Commonly known as: NIZORAL SHAMPOO INTO SCALP, LET SET FOR 10 MINUTES THEN WASH OFF, USE 3 TIMES PER WEEK   losartan 50 MG tablet Commonly known as: COZAAR TAKE ONE TABLET BY MOUTH ONCE DAILY   metFORMIN 500 MG 24 hr tablet Commonly known as: GLUCOPHAGE-XR TAKE ONE TABLET BY MOUTH EVERYDAY AT BEDTIME   methocarbamol 500 MG tablet Commonly known as: ROBAXIN Take 1 tablet (500 mg total) by mouth every 6 (six) hours as needed for  muscle spasms.   multivitamin with minerals Tabs tablet Take 1 tablet by mouth daily.   ONE TOUCH ULTRA 2 w/Device Kit Use to check blood sugar up to 2 times daily   OneTouch Ultra test strip Generic drug: glucose blood Use to check blood sugar up to twice per day   oxyCODONE 5 MG immediate release tablet Commonly known as: Oxy IR/ROXICODONE Take 1 tablet (5 mg total) by mouth every 4 (four) hours as needed for up to 7 days for moderate pain ((score 4 to 6)).   rosuvastatin 5 MG tablet Commonly  known as: CRESTOR TAKE ONE TABLET BY MOUTH ONCE DAILY   senna 8.6 MG Tabs tablet Commonly known as: SENOKOT Take 1 tablet (8.6 mg total) by mouth 2 (two) times daily.   tamsulosin 0.4 MG Caps capsule Commonly known as: FLOMAX Take 1 capsule (0.4 mg total) by mouth daily.               Durable Medical Equipment  (From admission, onward)           Start     Ordered   05/14/22 0853  For home use only DME Walker rolling  Once       Question Answer Comment  Walker: With Rudolph Wheels   Patient needs a walker to treat with the following condition Impaired mobility      05/14/22 0853            Follow-up Information     Geronimo Boot, PA-C Follow up on 05/28/2022.   Specialty: Neurosurgery Why: 1030 Contact information: [address] [place] [address] [phone]                 Signed: Meade Maw 05/14/2022, 2:43 PM

## 2022-05-16 ENCOUNTER — Encounter: Payer: Self-pay | Admitting: Family Medicine

## 2022-05-17 ENCOUNTER — Telehealth: Payer: Self-pay | Admitting: *Deleted

## 2022-05-17 ENCOUNTER — Ambulatory Visit
Payer: PPO | Attending: Student in an Organized Health Care Education/Training Program | Admitting: Student in an Organized Health Care Education/Training Program

## 2022-05-17 DIAGNOSIS — I700 Atherosclerosis of aorta: Secondary | ICD-10-CM | POA: Diagnosis not present

## 2022-05-17 DIAGNOSIS — M5441 Lumbago with sciatica, right side: Secondary | ICD-10-CM | POA: Diagnosis not present

## 2022-05-17 DIAGNOSIS — M47812 Spondylosis without myelopathy or radiculopathy, cervical region: Secondary | ICD-10-CM

## 2022-05-17 DIAGNOSIS — I2510 Atherosclerotic heart disease of native coronary artery without angina pectoris: Secondary | ICD-10-CM | POA: Diagnosis not present

## 2022-05-17 DIAGNOSIS — Z4789 Encounter for other orthopedic aftercare: Secondary | ICD-10-CM | POA: Diagnosis not present

## 2022-05-17 DIAGNOSIS — E119 Type 2 diabetes mellitus without complications: Secondary | ICD-10-CM | POA: Diagnosis not present

## 2022-05-17 DIAGNOSIS — Z981 Arthrodesis status: Secondary | ICD-10-CM | POA: Diagnosis not present

## 2022-05-17 DIAGNOSIS — J449 Chronic obstructive pulmonary disease, unspecified: Secondary | ICD-10-CM | POA: Diagnosis not present

## 2022-05-17 DIAGNOSIS — M4722 Other spondylosis with radiculopathy, cervical region: Secondary | ICD-10-CM | POA: Diagnosis not present

## 2022-05-17 DIAGNOSIS — N401 Enlarged prostate with lower urinary tract symptoms: Secondary | ICD-10-CM | POA: Diagnosis not present

## 2022-05-17 DIAGNOSIS — G8929 Other chronic pain: Secondary | ICD-10-CM | POA: Diagnosis not present

## 2022-05-17 DIAGNOSIS — I119 Hypertensive heart disease without heart failure: Secondary | ICD-10-CM | POA: Diagnosis not present

## 2022-05-17 NOTE — Patient Outreach (Signed)
  Care Coordination TOC Note Transition Care Management Follow-up Telephone Call Date of discharge and from where: [facility] [phone] How have you been since you were released from the hospital? Doing ok Blood sugar is a little high today but it is coming down Any questions or concerns? No  Items Reviewed: Did the pt receive and understand the discharge instructions provided? Yes  Medications obtained and verified? Yes  Other? No  Any new allergies since your discharge? No  Dietary orders reviewed? Yes low sodium heart healthy diet Do you have support at home? Yes  wife  Home Care and Equipment/Supplies: Were home health services ordered? yes If so, what is the name of the agency? [facility]  Has the agency set up a time to come to the patient's home? yes Were any new equipment or medical supplies ordered?  Yes:   What is the name of the medical supply agency? [facility] Were you able to get the supplies/equipment? yes Do you have any questions related to the use of the equipment or supplies? No  Functional Questionnaire: (I = Independent and D = Dependent) ADLs: I  Bathing/Dressing- I  Meal Prep- D  Eating- I  Maintaining continence- I  Transferring/Ambulation- I  Managing Meds- I  Follow up appointments reviewed:  PCP Hospital f/u appt confirmed? No   Specialist Hospital f/u appt confirmed? Yes  Scheduled to see Ellwood Dense [phone] 10:30 . Are transportation arrangements needed? No  If their condition worsens, is the pt aware to call PCP or go to the Emergency Dept.? Yes Was the patient provided with contact information for the PCP's office or ED? Yes Was to pt encouraged to call back with questions or concerns? Yes  SDOH assessments and interventions completed:   Yes  Care Coordination Interventions Activated:  Yes   Care Coordination Interventions:  No Care Coordination interventions needed at this time.   Encounter Outcome:  Pt. Visit Completed    [facility] [phone]

## 2022-05-17 NOTE — Progress Notes (Signed)
Patient: Isaac Weber  Service Category: E/M  Provider: Gillis Santa, MD  DOB: [birth year]  DOS: 05/17/2022  Location: Office  MRN: [id number]  Setting: Ambulatory outpatient  Referring Provider: Nobie Putnam *  Type: Established Patient  Specialty: Interventional Pain Management  PCP: Olin Hauser, DO  Location: Remote location  Delivery: TeleHealth     Virtual Encounter - Pain Management PROVIDER NOTE: Information contained herein reflects review and annotations entered in association with encounter. Interpretation of such information and data should be left to medically-trained personnel. Information provided to patient can be located elsewhere in the medical record under "Patient Instructions". Document created using STT-dictation technology, any transcriptional errors that may result from process are unintentional.    Contact & Pharmacy Preferred: [phone] Home: [phone] (home) Mobile: [phone] (mobile) E-mail: [email or web address] [phone], [place] [address] [place] [address] Phone: [phone] Fax: [phone]  [facility] - [place], [place] - [address] [place] [address] Phone: [phone] Fax: [phone]   Pre-screening  Isaac Weber offered "in-person" vs "virtual" encounter. He indicated preferring virtual for this encounter.   Reason COVID-19*  Social distancing based on CDC and AMA recommendations.   I contacted Isaac Weber on 05/17/2022 via telephone.      I clearly identified myself as Gillis Santa, MD. I verified that I was speaking with the correct person using two identifiers (Name: Isaac Weber, and date of birth: May 25, 1946).  Consent I sought verbal advanced consent from Isaac Weber for virtual visit interactions. I informed Isaac Weber of possible security and privacy  concerns, risks, and limitations associated with providing "not-in-person" medical evaluation and management services. I also informed Isaac Weber of the availability of "in-person" appointments. Finally, I informed him that there would be a charge for the virtual visit and that he could be  personally, fully or partially, financially responsible for it. Isaac Weber expressed understanding and agreed to proceed.   Historic Elements   Isaac Weber is a 77 y.o. year old, male patient evaluated today after our last contact on 04/14/2022. Isaac Weber  has a past medical history of Abnormal chest CT, Actinic keratosis, Allergic rhinitis, unspecified, Anemia, Aortic atherosclerosis ([place]), Arthritis, BPH (benign prostatic hyperplasia), COPD (chronic obstructive pulmonary disease) ([place]), Coronary artery disease, Cough variant asthma, Diabetes mellitus without complication ([place]), Glaucoma, Hypertension, Other chronic pain, Pancreatic lesion, Pneumonia, Pure hypercholesterolemia, unspecified, SCC (squamous cell carcinoma) (03/10/2021), SCC (squamous cell carcinoma) (03/10/2021), 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 He also  has a past surgical history that includes Knee arthroscopy (Right, 1991); Bunionectomy (Left, 2003); Prostate surgery (2002); Rotator cuff repair (Left, 1999); Cervical disc surgery; Leg Surgery (Left); Rotator cuff repair (Right, 1996); Green light laser turp (transurethral resection of prostate (2010); Lumbar disc surgery (2010); Bunionectomy with hammertoe reconstruction (Left, 2013); Lumbar disc surgery (2011); Back surgery; Colonoscopy with propofol (N/A, 04/30/2021); Video bronchoscopy (N/A, 11/26/2021); Bronchial washings (11/26/2021); Tonsillectomy; Transforaminal lumbar interbody fusion w/ mis 1 level (Right, 05/12/2022); Lumbar laminectomy/decompression microdiscectomy (Right, 66/12/43); and Application of intraoperative CT scan  (N/A, 05/12/2022). Isaac Weber has a current medication list which includes the following prescription(s): albuterol, amlodipine, one touch ultra 2, ferrous sulfate, gabapentin, glucosamine-chondroitin, guaifenesin, hydrochlorothiazide, ipratropium, ketoconazole, losartan, metformin, multiple minerals-vitamins, multivitamin with minerals, onetouch ultra, rosuvastatin, tamsulosin, celecoxib, methocarbamol, oxycodone,  and senna. He  reports that he quit smoking about 50 years ago. His smoking use included cigarettes. He has a 78.00 pack-year smoking history. He has been exposed to tobacco smoke. He has never used smokeless tobacco. He reports current alcohol use of about 2.0 standard drinks of alcohol per week. He reports that he does not use drugs. Isaac Weber is allergic to morphine and related.   HPI  Today, he is being contacted for a post-procedure assessment.   Post-procedure evaluation   Type: Cervical Facet Medial Branch Block(s) #1  Laterality: Bilateral  Level: C3, C4,  Medial Branch Level(s). Injecting these levels blocks the C3-4, cervical facet joints.  Imaging: Fluoroscopic guidance Anesthesia: Local anesthesia (1-2% Lidocaine) DOS: 04/14/2022  Performed by: Gillis Santa, MD  Purpose: Diagnostic/Therapeutic Indications: Cervicalgia (cervical spine axial pain) severe enough to impact quality of life or function.  CLINICAL DATA:  Neck pain, chronic, degenerative changes on xray. Prior C-spine fusion, now having severe right neck and shoulder pain, concern for C4/5 radiculopathy.   EXAM: MRI CERVICAL SPINE WITHOUT AND WITH CONTRAST   TECHNIQUE: Multiplanar and multiecho pulse sequences of the cervical spine, to include the craniocervical junction and cervicothoracic junction, were obtained without and with intravenous contrast.   CONTRAST:  6m GADAVIST GADOBUTROL 1 MMOL/ML IV SOLN   COMPARISON:  Cervical spine radiographs 03/02/2022   FINDINGS: Alignment: Minimal  anterolisthesis of C3 on C4, C4 on C5, and C5 on C6.   Vertebrae: No fracture, suspicious marrow lesion, or significant marrow edema. Solid interbody osseous fusion at C5-6 and C6-7.   Cord: Normal cord signal and morphology.  No abnormal enhancement.   Posterior Fossa, vertebral arteries, paraspinal tissues: Unremarkable.   Disc levels:   C2-3: Minimal disc bulging, uncovertebral spurring, and moderate to severe right and mild left facet arthrosis without significant stenosis.   C3-4: Mild disc space narrowing. Anterolisthesis with bulging uncovered disc, uncovertebral spurring, and severe facet arthrosis result in mild spinal stenosis and moderate bilateral neural foraminal stenosis.   C4-5: Mild disc space narrowing. Anterolisthesis with bulging uncovered disc, asymmetric right uncovertebral spurring, and severe right and moderate left facet arthrosis result in mild spinal stenosis and moderate right neural foraminal stenosis.   C5-6: Anterior fusion.  No stenosis.   C6-7: Anterior fusion.  No stenosis.   C7-T1: Moderate disc space narrowing. Disc bulging, uncovertebral spurring, and mild-to-moderate facet arthrosis result in mild-to-moderate right neural foraminal stenosis without spinal stenosis.   IMPRESSION: 1. Solid anterior fusion at C5-6 and C6-7 without residual stenosis. 2. Severe upper cervical facet arthrosis with mild spinal stenosis and moderate neural foraminal stenosis at C3-4 and C4-5. 3. Mild-to-moderate right neural foraminal stenosis at C7-T1.     Electronically Signed   By: ALogan BoresM.D.   On: 03/20/2022 11:50     1. Cervical spondylosis with radiculopathy   2. Cervical facet joint syndrome   3. S/P cervical spinal fusion   4. Spinal stenosis, lumbar region, with neurogenic claudication   5. 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 Lumbar radiculopathy   7. Failed back surgical syndrome   8. History of lumbar surgery     NAS-11 Pain score:   Pre-procedure: 8 /10   Post-procedure: 3 /10      Effectiveness:  Initial hour after procedure: 0 %  Subsequent 4-6 hours post-procedure: 0 %  Analgesia past initial 6 hours: 0 % (patient currently in hospital s/p L4,L5 fusion with Dr YIzora Ribas)  Ongoing improvement:  Analgesic:  0%    Laboratory Chemistry Profile   Renal Lab Results  Component Value Date   BUN 20 05/14/2022   CREATININE 0.57 (L) 28/78/6767   BCR NOT APPLICABLE 20/94/7096   GFR 81.65 01/15/2022   GFRAA 102 06/02/2020   GFRNONAA >60 05/14/2022    Hepatic Lab Results  Component Value Date   AST 24 01/28/2022   ALT 23 01/28/2022   ALBUMIN 3.9 01/28/2022   ALKPHOS 66 01/28/2022    Electrolytes Lab Results  Component Value Date   NA 132 (L) 05/14/2022   K 3.9 05/14/2022   CL 102 05/14/2022   CALCIUM 8.4 (L) 05/14/2022    Bone No results found for: "VD25OH", "VD125OH2TOT", "GE3662HU7", "ML4650PT4", "25OHVITD1", "25OHVITD2", "25OHVITD3", "TESTOFREE", "TESTOSTERONE"  Inflammation (CRP: Acute Phase) (ESR: Chronic Phase) Lab Results  Component Value Date   CRP 0.7 12/03/2021         Note: Above Lab results reviewed.  Imaging  DG Lumbar Spine 2-3 Views CLINICAL DATA:  L5-S1 effusion.  EXAM: LUMBAR SPINE - 2-3 VIEW  COMPARISON:  Lumbar spine MRI 11/05/2021  FINDINGS: Intraoperative spot films demonstrate posterior and interbody fusion hardware at L5-S1 (based on transitional lumbar anatomy seen on prior radiographs). No complicating features are identified.  IMPRESSION: Posterior and interbody fusion hardware in good position without complicating features.  Electronically Signed   By: Marijo Sanes M.D.   On: 05/12/2022 12:26 DG C-Arm 1-60 Min-No Report Fluoroscopy was utilized by the requesting physician.  No radiographic  interpretation.  DG C-Arm 1-60 Min-No Report Fluoroscopy was utilized by the requesting physician.  No radiographic  interpretation.  DG  C-Arm 1-60 Min-No Report Fluoroscopy was utilized by the requesting physician.  No radiographic  interpretation.   Assessment  The primary encounter diagnosis was Cervical spondylosis with radiculopathy. Diagnoses of Cervical facet joint syndrome and S/P cervical spinal fusion were also pertinent to this visit.  Plan of Care  Spoke with the patient.  He is currently in the hospital recovering from his lumbar spinal fusion.  He is having some left thigh pain.  He has been working with physical therapy.  He states that his team is very pleased with how he is progressing.  Unfortunately, no benefit with his previous cervical epidural steroid injection.  We will continue to monitor his symptoms.  Follow-up after he recovers from lumbar spine surgery as needed.    Follow-up plan:   Return PRN.     R L4 +L5 TF ESI 11/25/21, right L4-L5 interlaminar ESI 02/17/22, 03/24/22, B/L C3,4 MBNB 04/14/22   Recent Visits Date Type Provider Dept  04/14/22 Procedure visit Gillis Santa, MD [facility]  03/24/22 Procedure visit Gillis Santa, MD [facility]  03/09/22 Office Visit Gillis Santa, MD [facility]  02/17/22 Procedure visit Gillis Santa, MD [facility]  Showing recent visits within past 90 days and meeting all other requirements Today's Visits Date Type Provider Dept  05/17/22 Office Visit Gillis Santa, MD [facility]  Showing today's visits and meeting all other requirements Future Appointments No visits were found meeting these conditions. Showing future appointments within next 90 days and meeting all other requirements  I discussed the assessment and treatment plan with the patient. The patient was provided an opportunity to ask questions and all were answered. The patient agreed with the plan and demonstrated an understanding of the instructions.  Patient advised to call back or seek an in-person evaluation if the symptoms or condition  worsens.  Duration of encounter: 74mnutes.  Note by: BGillis Santa MD Date: 05/17/2022; Time: 3:12 PM

## 2022-05-18 ENCOUNTER — Telehealth: Payer: Self-pay

## 2022-05-18 NOTE — Telephone Encounter (Signed)
Transition Care Management Follow-up Telephone Call Date of discharge and from where: [facility] 10/22 How have you been since you were released from the hospital? Doing ok, nerve pain gone Any questions or concerns? No  Items Reviewed: Did the pt receive and understand the discharge instructions provided? Yes  Medications obtained and verified? Yes  Other? No  Any new allergies since your discharge? No  Dietary orders reviewed? No Do you have support at home? Yes    Functional Questionnaire: (I = Independent and D = Dependent) ADLs: I  Bathing/Dressing- I  Meal Prep- I  Eating- I  Maintaining continence- I  Transferring/Ambulation- I  Managing Meds- I  Follow up appointments reviewed:  PCP Hospital f/u appt confirmed? Yes  Scheduled to see Dr.K on 11/30 @ 9am. Are transportation arrangements needed? No  If their condition worsens, is the pt aware to call PCP or go to the Emergency Dept.? Yes Was the patient provided with contact information for the PCP's office or ED? Yes Was to pt encouraged to call back with questions or concerns? Yes

## 2022-05-20 ENCOUNTER — Encounter: Payer: Self-pay | Admitting: Neurosurgery

## 2022-05-21 ENCOUNTER — Encounter: Payer: Self-pay | Admitting: Dermatology

## 2022-05-26 DIAGNOSIS — G8929 Other chronic pain: Secondary | ICD-10-CM | POA: Diagnosis not present

## 2022-05-26 DIAGNOSIS — M5441 Lumbago with sciatica, right side: Secondary | ICD-10-CM | POA: Diagnosis not present

## 2022-05-26 DIAGNOSIS — E119 Type 2 diabetes mellitus without complications: Secondary | ICD-10-CM | POA: Diagnosis not present

## 2022-05-26 DIAGNOSIS — I2510 Atherosclerotic heart disease of native coronary artery without angina pectoris: Secondary | ICD-10-CM | POA: Diagnosis not present

## 2022-05-26 DIAGNOSIS — J449 Chronic obstructive pulmonary disease, unspecified: Secondary | ICD-10-CM | POA: Diagnosis not present

## 2022-05-26 DIAGNOSIS — Z4789 Encounter for other orthopedic aftercare: Secondary | ICD-10-CM | POA: Diagnosis not present

## 2022-05-26 DIAGNOSIS — I119 Hypertensive heart disease without heart failure: Secondary | ICD-10-CM | POA: Diagnosis not present

## 2022-05-26 DIAGNOSIS — Z981 Arthrodesis status: Secondary | ICD-10-CM | POA: Diagnosis not present

## 2022-05-26 DIAGNOSIS — N401 Enlarged prostate with lower urinary tract symptoms: Secondary | ICD-10-CM | POA: Diagnosis not present

## 2022-05-26 DIAGNOSIS — I700 Atherosclerosis of aorta: Secondary | ICD-10-CM | POA: Diagnosis not present

## 2022-05-27 NOTE — Progress Notes (Signed)
   REFERRING PHYSICIAN:  Andee Poles [place],  [place] [address]  DOS: 05/14/22  L4-5 TLIF, R L5/S1 decompression   HISTORY OF PRESENT ILLNESS: Isaac Weber is approximately 2 weeks status post L4-5 TLIF, R L5/S1 decompression . Was given celebrex, robaxin, and oxycodone on discharge from the hospital.   His preop right leg pain is gone! He has very minimal back pain. He is walking 1 and 1/2 miles with HHPT. He is only taking celebrex. He is very happy with his results. Being released from [facility] today.    PHYSICAL EXAMINATION:  General: Patient is well developed, well nourished, calm, collected, and in no apparent distress.   NEUROLOGICAL:  General: In no acute distress.   Awake, alert, oriented to person, place, and time.  Pupils equal round and reactive to light.  Facial tone is symmetric.     Strength:            Side Iliopsoas Quads Hamstring PF DF EHL  R '5 5 5 5 5 5  '$ L '5 5 5 5 5 5   '$ Incisions are c/d/i   ROS (Neurologic):  Negative except as noted above  IMAGING: Nothing new to review.   ASSESSMENT/PLAN:  Isaac Weber is doing very well s/p above surgery. Treatment options reviewed with patient and following plan made:   - I have advised the patient to lift up to 10 pounds until 6 weeks after surgery (follow up with Dr. Izora Ribas).  - Reviewed wound care.  - No bending, twisting, or lifting.  - Continue on current medications including celebrex.  - He is doing great, reminded him to not over do it.  - Follow up as scheduled in 4 weeks and prn.   Advised to contact the office if any questions or concerns arise.  Geronimo Boot PA-C Department of neurosurgery

## 2022-05-28 ENCOUNTER — Ambulatory Visit (INDEPENDENT_AMBULATORY_CARE_PROVIDER_SITE_OTHER): Payer: PPO | Admitting: Orthopedic Surgery

## 2022-05-28 ENCOUNTER — Encounter: Payer: Self-pay | Admitting: Orthopedic Surgery

## 2022-05-28 VITALS — BP 118/68 | Ht 69.0 in | Wt 160.0 lb

## 2022-05-28 DIAGNOSIS — M4316 Spondylolisthesis, lumbar region: Secondary | ICD-10-CM

## 2022-05-28 DIAGNOSIS — M5416 Radiculopathy, lumbar region: Secondary | ICD-10-CM

## 2022-05-28 DIAGNOSIS — Z09 Encounter for follow-up examination after completed treatment for conditions other than malignant neoplasm: Secondary | ICD-10-CM

## 2022-05-28 DIAGNOSIS — Z981 Arthrodesis status: Secondary | ICD-10-CM

## 2022-05-31 ENCOUNTER — Ambulatory Visit (INDEPENDENT_AMBULATORY_CARE_PROVIDER_SITE_OTHER): Payer: PPO | Admitting: Family Medicine

## 2022-05-31 ENCOUNTER — Encounter: Payer: Self-pay | Admitting: Family Medicine

## 2022-05-31 VITALS — BP 111/53 | HR 104 | Ht 69.0 in | Wt 163.0 lb

## 2022-05-31 DIAGNOSIS — M48061 Spinal stenosis, lumbar region without neurogenic claudication: Secondary | ICD-10-CM | POA: Diagnosis not present

## 2022-05-31 DIAGNOSIS — E1169 Type 2 diabetes mellitus with other specified complication: Secondary | ICD-10-CM

## 2022-05-31 DIAGNOSIS — M4316 Spondylolisthesis, lumbar region: Secondary | ICD-10-CM | POA: Diagnosis not present

## 2022-05-31 DIAGNOSIS — I10 Essential (primary) hypertension: Secondary | ICD-10-CM

## 2022-05-31 DIAGNOSIS — E876 Hypokalemia: Secondary | ICD-10-CM | POA: Diagnosis not present

## 2022-05-31 LAB — BASIC METABOLIC PANEL WITH GFR
BUN/Creatinine Ratio: 25 (calc) — ABNORMAL HIGH (ref 6–22)
BUN: 17 mg/dL (ref 7–25)
CO2: 27 mmol/L (ref 20–32)
Calcium: 9.5 mg/dL (ref 8.6–10.3)
Chloride: 100 mmol/L (ref 98–110)
Creat: 0.67 mg/dL — ABNORMAL LOW (ref 0.70–1.28)
Glucose, Bld: 241 mg/dL — ABNORMAL HIGH (ref 65–139)
Potassium: 3.9 mmol/L (ref 3.5–5.3)
Sodium: 136 mmol/L (ref 135–146)
eGFR: 96 mL/min/{1.73_m2} (ref >=60)

## 2022-05-31 NOTE — Progress Notes (Unsigned)
Subjective:    Patient ID: Isaac Weber, male    DOB: Sep 20, 1945, 76 y.o.   MRN: [id number]  Isaac Weber is a 76 y.o. male presenting on 05/31/2022 for Hospitalization Follow-up   HPI  HOSPITAL FOLLOW-UP VISIT  Hospital/Location: [facility] Date of Admission: 05/12/22 Date of Discharge: 05/16/22 Transitions of care telephone call: 05/18/22 Kirke Shaggy LPN  Reason for Admission: Back Surgery  [facility] H&P and Discharge Summary have been reviewed - Patient presents today 15 days after recent hospitalization. Brief summary of recent course, patient had symptoms of back pain, completed his elective spinal surgery.  He had higher sugars in hospital, now returning and improving.  He has done well with physical therapy and activity and improving ambulation.  - Today reports overall has done well after discharge. Symptoms of hyperglycemia have improved, back pain improved  Since discharged he remains OFF of Potassium supplement, he has tried to increase K intake, bananas and grapes, but has scaled back to half portion bananas at times.  Last Potassium 3.9 (05/14/22) previously 4.0, and prior to that was low at 3.1 (05/03/22)  Continues on HCTZ 12.90m dailiy  A1c trend previously    I have reviewed the discharge medication list, and have reconciled the current and discharge medications today.   Current Outpatient Medications:    albuterol (VENTOLIN HFA) 108 (90 Base) MCG/ACT inhaler, Inhale 1-2 puffs into the lungs every 4 (four) hours as needed for wheezing or shortness of breath (cough)., Disp: 1 each, Rfl: 2   amLODipine (NORVASC) 5 MG tablet, TAKE ONE TABLET BY MOUTH ONCE DAILY, Disp: 90 tablet, Rfl: 1   Blood Glucose Monitoring Suppl (ONE TOUCH ULTRA 2) w/Device KIT, Use to check blood sugar up to 2 times daily, Disp: 1 kit, Rfl: 0   celecoxib (CELEBREX) 200 MG capsule, Take 1 capsule (200 mg total) by mouth every 12 (twelve) hours., Disp: 60 capsule, Rfl: 0   ferrous  sulfate 325 (65 FE) MG tablet, Take 325 mg by mouth daily with breakfast., Disp: , Rfl:    Glucosamine-Chondroitin (GLUCOSAMINE CHONDR COMPLEX PO), Take 1 capsule by mouth 2 (two) times daily., Disp: , Rfl:    hydrochlorothiazide (HYDRODIURIL) 12.5 MG tablet, TAKE ONE TABLET BY MOUTH ONCE DAILY, Disp: 90 tablet, Rfl: 1   ketoconazole (NIZORAL) 2 % shampoo, SHAMPOO INTO SCALP, LET SET FOR 10 MINUTES THEN WASH OFF, USE 3 TIMES PER WEEK, Disp: 120 mL, Rfl: 3   losartan (COZAAR) 50 MG tablet, TAKE ONE TABLET BY MOUTH ONCE DAILY, Disp: 90 tablet, Rfl: 1   methocarbamol (ROBAXIN) 500 MG tablet, Take 1 tablet (500 mg total) by mouth every 6 (six) hours as needed for muscle spasms., Disp: 60 tablet, Rfl: 0   Multiple Minerals-Vitamins (CAL MAG ZINC +D3 PO), Take 1 tablet by mouth every evening., Disp: , Rfl:    Multiple Vitamin (MULTIVITAMIN WITH MINERALS) TABS tablet, Take 1 tablet by mouth daily., Disp: , Rfl:    ONETOUCH ULTRA test strip, Use to check blood sugar up to twice per day, Disp: 200 each, Rfl: 5   rosuvastatin (CRESTOR) 5 MG tablet, TAKE ONE TABLET BY MOUTH ONCE DAILY, Disp: 90 tablet, Rfl: 1   tamsulosin (FLOMAX) 0.4 MG CAPS capsule, Take 1 capsule (0.4 mg total) by mouth daily., Disp: 90 capsule, Rfl: 3   metFORMIN (GLUCOPHAGE-XR) 500 MG 24 hr tablet, Take 2 tablets (1,000 mg total) by mouth daily with breakfast., Disp: 180 tablet, Rfl: 1  ------------------------------------------------------------------------- Social History  Tobacco Use   Smoking status: Former    Packs/day: 3.00    Years: 26.00    Total pack years: 78.00    Types: Cigarettes    Quit date: 09/17/1971    Years since quitting: 50.7    Passive exposure: Past   Smokeless tobacco: Never  Vaping Use   Vaping Use: Never used  Substance Use Topics   Alcohol use: Yes    Alcohol/week: 2.0 standard drinks of alcohol    Types: 2 Standard drinks or equivalent per week   Drug use: Never    Review of Systems Per HPI  unless specifically indicated above     Objective:    BP (!) 111/53   Pulse (!) 104   Ht _0  (1.753 m)   Wt 163 lb (73.9 kg)   SpO2 99%   BMI 24.07 kg/m   Wt Readings from Last 3 Encounters:  05/31/22 163 lb (73.9 kg)  05/28/22 160 lb (72.6 kg)  05/12/22 160 lb (72.6 kg)    Physical Exam Vitals and nursing note reviewed.  Constitutional:      General: He is not in acute distress.    Appearance: Normal appearance. He is well-developed. He is not diaphoretic.     Comments: Well-appearing, comfortable, cooperative  HENT:     Head: Normocephalic and atraumatic.  Eyes:     General:        Right eye: No discharge.        Left eye: No discharge.     Conjunctiva/sclera: Conjunctivae normal.  Cardiovascular:     Rate and Rhythm: Normal rate.  Pulmonary:     Effort: Pulmonary effort is normal.  Skin:    General: Skin is warm and dry.     Findings: No erythema or rash.  Neurological:     Mental Status: He is alert and oriented to person, place, and time.  Psychiatric:        Mood and Affect: Mood normal.        Behavior: Behavior normal.        Thought Content: Thought content normal.     Comments: Well groomed, good eye contact, normal speech and thoughts       Results for orders placed or performed in visit on 53/29/92  BASIC METABOLIC PANEL WITH GFR  Result Value Ref Range   Glucose, Bld 241 (H) 65 - 139 mg/dL   BUN 17 7 - 25 mg/dL   Creat 0.67 (L) 0.70 - 1.28 mg/dL   eGFR 96 > OR = 60 mL/min/1.80m   BUN/Creatinine Ratio 25 (H) 6 - 22 (calc)   Sodium 136 135 - 146 mmol/L   Potassium 3.9 3.5 - 5.3 mmol/L   Chloride 100 98 - 110 mmol/L   CO2 27 20 - 32 mmol/L   Calcium 9.5 8.6 - 10.3 mg/dL      Assessment & Plan:   Problem List Items Addressed This Visit     Essential hypertension - Primary   Relevant Orders   BASIC METABOLIC PANEL WITH GFR (Completed)   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 We can re order higher pill count next time if you like. We will see A1c. Keep improving diet and activity Goal A1c < 7, discussed range 7-8 is acceptable  Hypokalemia IMproved On Thiazide HCTZ 12.77m27maily Potassium has been stable, we will recheck today.  Remain OFF potassium supplements.   No orders of the defined types were placed in this encounter.   Follow up plan: Return if symptoms worsen or fail to improve, for keep apt 11/27 and follow up.  Cancel previous lab orders Add BMET  Re order CMET CBC A1c Lipid TSH PSA  AleNobie PutnamO SouKo Olinaoup 05/31/2022, 2:01 PM

## 2022-05-31 NOTE — Patient Instructions (Addendum)
Thank you for coming to the office today.  Due for A1c at end of month Double dose Metformin XR '500mg'$  x 2 = '1000mg'$  in morning with meal. We can re order higher pill count next time if you like. We will see A1c. Keep improving diet and activity My hope is A1c will avg < 7 by next time, goal is overall < 8, but I am hopeful we can tighten it up.  Potassium has been stable, we will recheck today.  Remain OFF potassium supplements.  Keep on the fluid pill for now.  DUE for FASTING BLOOD WORK (no food or drink after midnight before the lab appointment, only water or coffee without cream/sugar on the morning of)  SCHEDULE "Lab Only" visit in the morning at the clinic for lab draw in 11/27  - Make sure Lab Only appointment is at about 1 week before your next appointment, so that results will be available  For Lab Results, once available within 2-3 days of blood draw, you can can log in to MyChart online to view your results and a brief explanation. Also, we can discuss results at next follow-up visit.   Please schedule a Follow-up Appointment to: Return if symptoms worsen or fail to improve, for keep apt 11/27 and follow up.  If you have any other questions or concerns, please feel free to call the office or send a message through [place]. You may also schedule an earlier appointment if necessary.  Additionally, you may be receiving a survey about your experience at our office within a few days to 1 week by e-mail or mail. We value your feedback.  Nobie Putnam, DO [place]

## 2022-06-01 ENCOUNTER — Other Ambulatory Visit: Payer: Self-pay | Admitting: Family Medicine

## 2022-06-01 DIAGNOSIS — Z0000 Encounter for general adult medical examination without abnormal findings: Secondary | ICD-10-CM

## 2022-06-01 DIAGNOSIS — I10 Essential (primary) hypertension: Secondary | ICD-10-CM

## 2022-06-01 DIAGNOSIS — E1169 Type 2 diabetes mellitus with other specified complication: Secondary | ICD-10-CM

## 2022-06-01 DIAGNOSIS — N401 Enlarged prostate with lower urinary tract symptoms: Secondary | ICD-10-CM

## 2022-06-10 ENCOUNTER — Other Ambulatory Visit: Payer: Self-pay | Admitting: Neurosurgery

## 2022-06-16 ENCOUNTER — Other Ambulatory Visit: Payer: Self-pay

## 2022-06-16 DIAGNOSIS — Z0000 Encounter for general adult medical examination without abnormal findings: Secondary | ICD-10-CM

## 2022-06-16 DIAGNOSIS — N401 Enlarged prostate with lower urinary tract symptoms: Secondary | ICD-10-CM

## 2022-06-16 DIAGNOSIS — I10 Essential (primary) hypertension: Secondary | ICD-10-CM

## 2022-06-16 DIAGNOSIS — E1169 Type 2 diabetes mellitus with other specified complication: Secondary | ICD-10-CM

## 2022-06-21 ENCOUNTER — Other Ambulatory Visit: Payer: PPO

## 2022-06-21 DIAGNOSIS — R351 Nocturia: Secondary | ICD-10-CM | POA: Diagnosis not present

## 2022-06-21 DIAGNOSIS — E1169 Type 2 diabetes mellitus with other specified complication: Secondary | ICD-10-CM | POA: Diagnosis not present

## 2022-06-21 DIAGNOSIS — N401 Enlarged prostate with lower urinary tract symptoms: Secondary | ICD-10-CM | POA: Diagnosis not present

## 2022-06-21 DIAGNOSIS — Z0000 Encounter for general adult medical examination without abnormal findings: Secondary | ICD-10-CM | POA: Diagnosis not present

## 2022-06-21 DIAGNOSIS — E785 Hyperlipidemia, unspecified: Secondary | ICD-10-CM | POA: Diagnosis not present

## 2022-06-21 DIAGNOSIS — I10 Essential (primary) hypertension: Secondary | ICD-10-CM | POA: Diagnosis not present

## 2022-06-22 LAB — TSH: TSH: 1.79 mIU/L (ref 0.40–4.50)

## 2022-06-22 LAB — COMPLETE METABOLIC PANEL WITH GFR
AG Ratio: 1.5 (calc) (ref 1.0–2.5)
ALT: 16 U/L (ref 9–46)
AST: 15 U/L (ref 10–35)
Albumin: 3.8 g/dL (ref 3.6–5.1)
Alkaline phosphatase (APISO): 82 U/L (ref 35–144)
BUN: 20 mg/dL (ref 7–25)
CO2: 26 mmol/L (ref 20–32)
Calcium: 9.6 mg/dL (ref 8.6–10.3)
Chloride: 103 mmol/L (ref 98–110)
Creat: 0.73 mg/dL (ref 0.70–1.28)
Globulin: 2.5 g/dL (calc) (ref 1.9–3.7)
Glucose, Bld: 151 mg/dL — ABNORMAL HIGH (ref 65–99)
Potassium: 4 mmol/L (ref 3.5–5.3)
Sodium: 138 mmol/L (ref 135–146)
Total Bilirubin: 0.7 mg/dL (ref 0.2–1.2)
Total Protein: 6.3 g/dL (ref 6.1–8.1)
eGFR: 94 mL/min/{1.73_m2} (ref >=60)

## 2022-06-22 LAB — CBC WITH DIFFERENTIAL/PLATELET
Absolute Monocytes: 510 cells/uL (ref 200–950)
Basophils Absolute: 48 cells/uL (ref 0–200)
Basophils Relative: 0.8 %
Eosinophils Absolute: 270 cells/uL (ref 15–500)
Eosinophils Relative: 4.5 %
HCT: 39 % (ref 38.5–50.0)
Hemoglobin: 13.5 g/dL (ref 13.2–17.1)
Lymphs Abs: 1350 cells/uL (ref 850–3900)
MCH: 32.8 pg (ref 27.0–33.0)
MCHC: 34.6 g/dL (ref 32.0–36.0)
MCV: 94.7 fL (ref 80.0–100.0)
MPV: 9.3 fL (ref 7.5–12.5)
Monocytes Relative: 8.5 %
Neutro Abs: 3822 cells/uL (ref 1500–7800)
Neutrophils Relative %: 63.7 %
Platelets: 200 10*3/uL (ref 140–400)
RBC: 4.12 10*6/uL — ABNORMAL LOW (ref 4.20–5.80)
RDW: 12.1 % (ref 11.0–15.0)
Total Lymphocyte: 22.5 %
WBC: 6 10*3/uL (ref 3.8–10.8)

## 2022-06-22 LAB — HEMOGLOBIN A1C
Hgb A1c MFr Bld: 6.3 % of total Hgb — ABNORMAL HIGH (ref <5.7)
Mean Plasma Glucose: 134 mg/dL
eAG (mmol/L): 7.4 mmol/L

## 2022-06-22 LAB — PSA: PSA: 3.85 ng/mL (ref <=4.00)

## 2022-06-22 LAB — LIPID PANEL
Cholesterol: 128 mg/dL (ref <200)
HDL: 58 mg/dL (ref >=40)
LDL Cholesterol (Calc): 56 mg/dL (calc)
Non-HDL Cholesterol (Calc): 70 mg/dL (calc) (ref <130)
Total CHOL/HDL Ratio: 2.2 (calc) (ref <5.0)
Triglycerides: 61 mg/dL (ref <150)

## 2022-06-23 ENCOUNTER — Other Ambulatory Visit: Payer: Self-pay

## 2022-06-23 DIAGNOSIS — Z981 Arthrodesis status: Secondary | ICD-10-CM

## 2022-06-24 ENCOUNTER — Encounter: Payer: Self-pay | Admitting: Family Medicine

## 2022-06-24 ENCOUNTER — Encounter: Payer: Self-pay | Admitting: Neurosurgery

## 2022-06-24 ENCOUNTER — Ambulatory Visit (INDEPENDENT_AMBULATORY_CARE_PROVIDER_SITE_OTHER): Payer: PPO | Admitting: Neurosurgery

## 2022-06-24 ENCOUNTER — Other Ambulatory Visit: Payer: Self-pay

## 2022-06-24 ENCOUNTER — Ambulatory Visit (INDEPENDENT_AMBULATORY_CARE_PROVIDER_SITE_OTHER): Payer: PPO | Admitting: Family Medicine

## 2022-06-24 ENCOUNTER — Ambulatory Visit
Admission: RE | Admit: 2022-06-24 | Discharge: 2022-06-24 | Disposition: A | Discharge status: Home / Self Care | Payer: PPO | Attending: Neurosurgery | Admitting: Neurosurgery

## 2022-06-24 ENCOUNTER — Ambulatory Visit
Admission: RE | Admit: 2022-06-24 | Discharge: 2022-06-24 | Disposition: A | Discharge status: Home / Self Care | Payer: PPO | Source: Ambulatory Visit | Attending: Neurosurgery | Admitting: Neurosurgery

## 2022-06-24 VITALS — BP 124/68 | Temp 97.6°F | Ht 69.0 in | Wt 168.0 lb

## 2022-06-24 VITALS — BP 121/65 | HR 118 | Ht 69.0 in | Wt 168.0 lb

## 2022-06-24 DIAGNOSIS — Z09 Encounter for follow-up examination after completed treatment for conditions other than malignant neoplasm: Secondary | ICD-10-CM

## 2022-06-24 DIAGNOSIS — E785 Hyperlipidemia, unspecified: Secondary | ICD-10-CM | POA: Diagnosis not present

## 2022-06-24 DIAGNOSIS — Z981 Arthrodesis status: Secondary | ICD-10-CM

## 2022-06-24 DIAGNOSIS — I10 Essential (primary) hypertension: Secondary | ICD-10-CM

## 2022-06-24 DIAGNOSIS — M4316 Spondylolisthesis, lumbar region: Secondary | ICD-10-CM | POA: Diagnosis not present

## 2022-06-24 DIAGNOSIS — Z0000 Encounter for general adult medical examination without abnormal findings: Secondary | ICD-10-CM

## 2022-06-24 DIAGNOSIS — J432 Centrilobular emphysema: Secondary | ICD-10-CM | POA: Diagnosis not present

## 2022-06-24 DIAGNOSIS — G8929 Other chronic pain: Secondary | ICD-10-CM

## 2022-06-24 DIAGNOSIS — R351 Nocturia: Secondary | ICD-10-CM | POA: Diagnosis not present

## 2022-06-24 DIAGNOSIS — M545 Low back pain, unspecified: Secondary | ICD-10-CM | POA: Diagnosis not present

## 2022-06-24 DIAGNOSIS — N401 Enlarged prostate with lower urinary tract symptoms: Secondary | ICD-10-CM

## 2022-06-24 DIAGNOSIS — E1169 Type 2 diabetes mellitus with other specified complication: Secondary | ICD-10-CM

## 2022-06-24 DIAGNOSIS — M25511 Pain in right shoulder: Secondary | ICD-10-CM

## 2022-06-24 DIAGNOSIS — M5412 Radiculopathy, cervical region: Secondary | ICD-10-CM

## 2022-06-24 MED ORDER — METFORMIN HCL ER 500 MG PO TB24
500.0000 mg | ORAL_TABLET | Freq: Every day | ORAL | 3 refills | Status: DC
  Filled 2022-06-24 – 2022-08-03 (×3): qty 90, 90d supply, fill #0

## 2022-06-24 MED ORDER — ONETOUCH ULTRA VI STRP
ORAL_STRIP | 5 refills | Status: AC
  Filled 2022-06-24: qty 100, 50d supply, fill #0
  Filled 2022-06-24: qty 200, fill #0
  Filled 2022-08-02: qty 100, 50d supply, fill #1
  Filled 2022-10-11: qty 100, 50d supply, fill #2
  Filled 2023-01-16: qty 100, 50d supply, fill #3
  Filled 2023-03-14: qty 100, 50d supply, fill #4
  Filled 2023-05-21: qty 100, 50d supply, fill #5

## 2022-06-24 NOTE — Assessment & Plan Note (Signed)
Controlled Home BP reviewed No known complications     Plan:  1. Continue current BP regimen - Losartan '50mg'$  and HCTZ 12.'5mg'$  daily, Amlodipine '5mg'$  daily 2. Encourage improved lifestyle - low sodium diet, regular exercise 3. Continue monitor BP outside office, bring readings to next visit, if persistently >140/90 or new symptoms notify office sooner

## 2022-06-24 NOTE — Patient Instructions (Addendum)
Thank you for coming to the office today.  Keep up the great work  Sent Metformin back down to 1 pill a day to [employer] and Test strips, send me a message if this works out well and when you are ready for the others.  Labs are excellent  Potassium in a good range  Recent Labs    12/07/21 0802 06/21/22 0810  HGBA1C 7.1* 6.3*   Foot check, urine check, and request eye exam.   Please schedule a Follow-up Appointment to: Return in about 6 months (around 12/23/2022) for 6 month follow-up DM A1c, HTN.  If you have any other questions or concerns, please feel free to call the office or send a message through [place]. You may also schedule an earlier appointment if necessary.  Additionally, you may be receiving a survey about your experience at our office within a few days to 1 week by e-mail or mail. We value your feedback.  Nobie Putnam, DO [place]

## 2022-06-24 NOTE — Progress Notes (Signed)
Subjective:    Patient ID: Isaac Weber, male    DOB: [birth year], 76 y.o.   MRN: [id number]  Isaac Weber is a 76 y.o. male presenting on 06/24/2022 for Annual Exam   HPI   Here for Annual Physical   CHRONIC DM, Type 2 Hyperlipidemia ASCVD Risk LDL controlled. He remains on Rosuvastatin 70m daily for cardiovascular prevention. A1c down to 6.3 now improved Meds: Metformin XR 5042mx2 = 100022maily at bedtime - interested to reduce to previous dose again OneTouch Ultra 2 test supplies  Reports good compliance. Tolerating well w/o side-effects Currently on ARB, Statin [facility] request copy Denies hypoglycemia, polyuria, visual changes, numbness or tingling.   ASCVD Risk LDL controlled. He remains on Rosuvastatin 5mg71mily for cardiovascular prevention.   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 He gets BP checked every 8 weeks with donating blood. K has been stable on labs Current Meds - Losartan 50mg33m HCTZ 12.5mg d28my, Amlodipine 5mg da47m Reports good compliance, took meds today. Tolerating well, w/o complaints. Denies CP, dyspnea, HA, edema, dizziness / lightheadedness   History of Iron Deficiency Donates blood every 8 weeks. He takes iron supplement.   Osteoarthritis, bilateral knees, lumbar spine Chronic problem, multiple joints, episodic pain and flares He has has seen chiropractor as well. He said he tried Relief Factor, for 7 weeks limited effect. - Uses Voltaren topical PRN with good results - Not effective Tylenol Taking Motrin 200mg x 79m800mg PRN74my on day of golf. Not daily or regularly He is able to play golf weekly and is very active   PMH Bilateral Rotator Cuff Impingement / Shoulder Pain    BPH LUTS s/p procedure, laser 2010 improvement - he has some  BPH LUTS, some frequency, some urgency, nocturia 3 x nightly - taking Tamsulosin 0.4mg daily80mnd may try 2 eventually - [place], will have DRE regularly and PSA   Last update on PSA with improvement from 4.06 down to 3.85, next yearly visit Dr Sninsky [facility] Mame may resolve the follow up and just check lab here   GERD Thought chronic cough. Omeprazole 40mg twice51may, he plans to reduce back to once daily.   S[facility], chronic  [facility] antibiotics Amoxicillin after camera/light into sinuses and CT, has resolved his chronic cough.   Health Maintenance:   Future reconsider 2nd dose PNA Vaccine Pneumovax23 now after age 88.   UTD F61 Shot      06/24/2022    9:05 AM 04/16/2022    8:59 AM 04/14/2022    1:41 PM  Depression screen PHQ 2/9  Decreased Interest 0 0 0  Down, Depressed, Hopeless 0 0 0  PHQ - 2 Score 0 0 0  Altered sleeping 0 0   Tired, decreased energy 0 0   Change in appetite 0 0   Feeling bad or failure about yourself  0 0   Trouble concentrating 0 0   Moving slowly or fidgety/restless 0 0   Suicidal thoughts 0 0   PHQ-9 Score 0 0   Difficult doing work/chores Not difficult at all Not difficult at all     Past Medical History:  Diagnosis Date   Abnormal chest CT    Actinic keratosis  Allergic rhinitis, unspecified    Anemia    Aortic atherosclerosis (HCC)    Arthritis    BPH (benign prostatic hyperplasia)    COPD (chronic obstructive pulmonary disease) (HCC)    Coronary artery disease    Cough variant asthma    Diabetes mellitus without complication (HCC)    Glaucoma    Hypertension    Other chronic pain    Pancreatic lesion    Pneumonia    Pure hypercholesterolemia, unspecified    SCC (squamous cell carcinoma) 03/10/2021   R upper forearm, [facility]   SCC (squamous cell carcinoma) 03/10/2021   R medial lower pretibial, [facility]   Squamous cell carcinoma of skin 07/23/2019   left  medial lower leg above medial ankle; SCC/KA type. 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 WD SCC, ulcerated. [facility] 04/29/2020   Past Surgical History:  Procedure Laterality Date   APPLICATION OF INTRAOPERATIVE CT SCAN N/A 05/12/2022   Procedure: APPLICATION OF INTRAOPERATIVE CT SCAN;  Surgeon: Meade Maw, MD;  Location: [facility] ORS;  Service: Neurosurgery;  Laterality: N/A;   BACK SURGERY     BRONCHIAL WASHINGS  11/26/2021   Procedure: BRONCHIAL WASHINGS;  Surgeon: Brand Males, MD;  Location: [facility] ENDOSCOPY;  Service: Endoscopy;;   BUNIONECTOMY Left 2003   hammer toe as well   BUNIONECTOMY WITH HAMMERTOE RECONSTRUCTION Left 2013   repeat   CERVICAL DISC SURGERY     COLONOSCOPY WITH PROPOFOL N/A 04/30/2021   Procedure: COLONOSCOPY WITH PROPOFOL;  Surgeon: Jonathon Bellows, MD;  Location: [facility] ENDOSCOPY;  Service: Gastroenterology;  Laterality: N/A;   GREEN LIGHT LASER TURP (TRANSURETHRAL RESECTION OF PROSTATE  2010   laser, shrink prostate   KNEE ARTHROSCOPY Right 1991   LEG SURGERY Left    BENIGN BONE TUMOR   LUMBAR [place] SURGERY  2010   discectomy   LUMBAR DISC SURGERY  2011   LUMBAR LAMINECTOMY/DECOMPRESSION MICRODISCECTOMY Right 05/12/2022   Procedure: RIGHT L5-S1 DISCECTOMY;  Surgeon: Meade Maw, MD;  Location: [facility] ORS;  Service: Neurosurgery;  Laterality: Right;   PROSTATE SURGERY  2002   shrink prostate   ROTATOR CUFF REPAIR Left 1999   debride, remove bonespur   ROTATOR CUFF REPAIR Right 1996   TONSILLECTOMY     TRANSFORAMINAL LUMBAR INTERBODY FUSION W/ MIS 1 LEVEL Right 05/12/2022   Procedure: RIGHT L4-5 MINIMALLY INVASIVE (MIS) TRANSFORAMINAL LUMBAR INTERBODY FUSION (TLIF);  Surgeon: Meade Maw, MD;  Location: [facility] ORS;  Service: Neurosurgery;  Laterality: Right;   VIDEO BRONCHOSCOPY N/A 11/26/2021   Procedure: VIDEO BRONCHOSCOPY WITHOUT FLUORO;  Surgeon: Brand Males, MD;  Location: [facility] ENDOSCOPY;   Service: Endoscopy;  Laterality: N/A;  for chroni ccough BAL   Social History   Socioeconomic History   Marital status: Married    Spouse name: Everette Mall   Number of children: Not on file   Years of education: 12   Highest education level: High school graduate  Occupational History   Occupation: retired  Tobacco Use   Smoking status: Former    Packs/day: 3.00    Years: 26.00    Total pack years: 78.00    Types: Cigarettes    Quit date: 09/17/1971    Years since quitting: 50.8    Passive exposure: Past   Smokeless tobacco: Never  Vaping Use   Vaping Use: Never used  Substance and Sexual Activity   Alcohol use: Yes    Alcohol/week: 2.0 standard drinks of alcohol    Types:  2 Standard drinks or equivalent per week   Drug use: Never   Sexual activity: Yes    Partners: Female  Other Topics Concern   Not on file  Social History Narrative   Not on file   Social Determinants of Health   Financial Resource Strain: Low Risk  (04/16/2022)   Overall Financial Resource Strain (CARDIA)    Difficulty of Paying Living Expenses: Not hard at all  Food Insecurity: No Food Insecurity (05/12/2022)   Hunger Vital Sign    Worried About Running Out of Food in the Last Year: Never true    Ran Out of Food in the Last Year: Never true  Transportation Needs: No Transportation Needs (05/12/2022)   PRAPARE - Hydrologist (Medical): No    Lack of Transportation (Non-Medical): No  Physical Activity: Sufficiently Active (04/16/2022)   Exercise Vital Sign    Days of Exercise per Week: 5 days    Minutes of Exercise per Session: 30 min  Stress: No Stress Concern Present (04/16/2022)   [place]    Feeling of Stress : Not at all  Social Connections: Moderately Integrated (04/16/2022)   Social Connection and Isolation Panel [NHANES]    Frequency of Communication with Friends and Family: More than three  times a week    Frequency of Social Gatherings with Friends and Family: More than three times a week    Attends Religious Services: More than 4 times per year    Active Member of [employer] or Organizations: No    Attends Archivist Meetings: Never    Marital Status: Married  Human resources officer Violence: Not At Risk (05/12/2022)   Humiliation, Afraid, Rape, and Kick questionnaire    Fear of Current or Ex-Partner: No    Emotionally Abused: No    Physically Abused: No    Sexually Abused: No   Family History  Problem Relation Age of Onset   Heart disease Mother 35   Emphysema Father 52   Heart disease Brother 34   Current Outpatient Medications on File Prior to Visit  Medication Sig   albuterol (VENTOLIN HFA) 108 (90 Base) MCG/ACT inhaler Inhale 1-2 puffs into the lungs every 4 (four) hours as needed for wheezing or shortness of breath (cough).   amLODipine (NORVASC) 5 MG tablet TAKE ONE TABLET BY MOUTH ONCE DAILY   Blood Glucose Monitoring Suppl (ONE TOUCH ULTRA 2) w/Device KIT Use to check blood sugar up to 2 times daily   celecoxib (CELEBREX) 200 MG capsule TAKE 1 CAPSULE(200 MG) BY MOUTH EVERY 12 HOURS   ferrous sulfate 325 (65 FE) MG tablet Take 325 mg by mouth daily with breakfast.   Glucosamine-Chondroitin (GLUCOSAMINE CHONDR COMPLEX PO) Take 1 capsule by mouth 2 (two) times daily.   hydrochlorothiazide (HYDRODIURIL) 12.5 MG tablet TAKE ONE TABLET BY MOUTH ONCE DAILY   ketoconazole (NIZORAL) 2 % shampoo SHAMPOO INTO SCALP, LET SET FOR 10 MINUTES THEN WASH OFF, USE 3 TIMES PER WEEK   losartan (COZAAR) 50 MG tablet TAKE ONE TABLET BY MOUTH ONCE DAILY   methocarbamol (ROBAXIN) 500 MG tablet Take 1 tablet (500 mg total) by mouth every 6 (six) hours as needed for muscle spasms.   Multiple Minerals-Vitamins (CAL MAG ZINC +D3 PO) Take 1 tablet by mouth every evening.   Multiple Vitamin (MULTIVITAMIN WITH MINERALS) TABS tablet Take 1 tablet by mouth daily.   rosuvastatin (CRESTOR) 5  MG tablet TAKE ONE TABLET  BY MOUTH ONCE DAILY   tamsulosin (FLOMAX) 0.4 MG CAPS capsule Take 1 capsule (0.4 mg total) by mouth daily.   No current facility-administered medications on file prior to visit.    Review of Systems  Constitutional:  Negative for activity change, appetite change, chills, diaphoresis, fatigue and fever.  HENT:  Negative for congestion and hearing loss.   Eyes:  Negative for visual disturbance.  Respiratory:  Negative for cough, chest tightness, shortness of breath and wheezing.   Cardiovascular:  Negative for chest pain, palpitations and leg swelling.  Gastrointestinal:  Negative for abdominal pain, constipation, diarrhea, nausea and vomiting.  Genitourinary:  Negative for dysuria, frequency and hematuria.  Musculoskeletal:  Negative for arthralgias and neck pain.  Skin:  Negative for rash.  Neurological:  Negative for dizziness, weakness, light-headedness, numbness and headaches.  Hematological:  Negative for adenopathy.  Psychiatric/Behavioral:  Negative for behavioral problems, dysphoric mood and sleep disturbance.    Per HPI unless specifically indicated above      Objective:    BP 121/65   Pulse (!) 118   Ht _0  (1.753 m)   Wt 168 lb (76.2 kg)   SpO2 100%   BMI 24.81 kg/m   Wt Readings from Last 3 Encounters:  06/24/22 168 lb (76.2 kg)  06/24/22 168 lb (76.2 kg)  05/31/22 163 lb (73.9 kg)    Physical Exam Vitals and nursing note reviewed.  Constitutional:      General: He is not in acute distress.    Appearance: He is well-developed. He is not diaphoretic.     Comments: Well-appearing, comfortable, cooperative  HENT:     Head: Normocephalic and atraumatic.  Eyes:     General:        Right eye: No discharge.        Left eye: No discharge.     Conjunctiva/sclera: Conjunctivae normal.     Pupils: Pupils are equal, round, and reactive to light.  Neck:     Thyroid: No thyromegaly.  Cardiovascular:     Rate and Rhythm: Normal rate and  regular rhythm.     Pulses: Normal pulses.     Heart sounds: Normal heart sounds. No murmur heard. Pulmonary:     Effort: Pulmonary effort is normal. No respiratory distress.     Breath sounds: Normal breath sounds. No wheezing or rales.  Abdominal:     General: Bowel sounds are normal. There is no distension.     Palpations: Abdomen is soft. There is no mass.     Tenderness: There is no abdominal tenderness.  Musculoskeletal:        General: No tenderness. Normal range of motion.     Cervical back: Normal range of motion and neck supple.     Comments: Upper / Lower Extremities: - Normal muscle tone, strength bilateral upper extremities 5/5, lower extremities 5/5  Lymphadenopathy:     Cervical: No cervical adenopathy.  Skin:    General: Skin is warm and dry.     Findings: No erythema or rash.  Neurological:     Mental Status: He is alert and oriented to person, place, and time.     Comments: Distal sensation intact to light touch all extremities  Psychiatric:        Mood and Affect: Mood normal.        Behavior: Behavior normal.        Thought Content: Thought content normal.     Comments: Well groomed, good eye contact, normal speech  and thoughts     Diabetic Foot Exam - Simple   Simple Foot Form Diabetic Foot exam was performed with the following findings: Yes 06/24/2022  9:19 AM  Visual Inspection See comments: Yes Sensation Testing Intact to touch and monofilament testing bilaterally: Yes Pulse Check Posterior Tibialis and Dorsalis pulse intact bilaterally: Yes Comments No callus formation. Some hammer toe deformity 2nd toe each side. Normal nails. Intact monofilament.      Results for orders placed or performed in visit on 06/16/22  TSH  Result Value Ref Range   TSH 1.79 0.40 - 4.50 mIU/L  PSA  Result Value Ref Range   PSA 3.85 < OR = 4.00 ng/mL  Hemoglobin A1c  Result Value Ref Range   Hgb A1c MFr Bld 6.3 (H) <5.7 % of total Hgb   Mean Plasma Glucose 134  mg/dL   eAG (mmol/L) 7.4 mmol/L  Lipid panel  Result Value Ref Range   Cholesterol 128 <200 mg/dL   HDL 58 > OR = 40 mg/dL   Triglycerides 61 <150 mg/dL   LDL Cholesterol (Calc) 56 mg/dL (calc)   Total CHOL/HDL Ratio 2.2 <5.0 (calc)   Non-HDL Cholesterol (Calc) 70 <130 mg/dL (calc)  CBC with Differential/Platelet  Result Value Ref Range   WBC 6.0 3.8 - 10.8 Thousand/uL   RBC 4.12 (L) 4.20 - 5.80 Million/uL   Hemoglobin 13.5 13.2 - 17.1 g/dL   HCT 39.0 38.5 - 50.0 %   MCV 94.7 80.0 - 100.0 fL   MCH 32.8 27.0 - 33.0 pg   MCHC 34.6 32.0 - 36.0 g/dL   RDW 12.1 11.0 - 15.0 %   Platelets 200 140 - 400 Thousand/uL   MPV 9.3 7.5 - 12.5 fL   Neutro Abs 3,822 1,500 - 7,800 cells/uL   Lymphs Abs 1,350 850 - 3,900 cells/uL   Absolute Monocytes 510 200 - 950 cells/uL   Eosinophils Absolute 270 15 - 500 cells/uL   Basophils Absolute 48 0 - 200 cells/uL   Neutrophils Relative % 63.7 %   Total Lymphocyte 22.5 %   Monocytes Relative 8.5 %   Eosinophils Relative 4.5 %   Basophils Relative 0.8 %  COMPLETE METABOLIC PANEL WITH GFR  Result Value Ref Range   Glucose, Bld 151 (H) 65 - 99 mg/dL   BUN 20 7 - 25 mg/dL   Creat 0.73 0.70 - 1.28 mg/dL   eGFR 94 > OR = 60 mL/min/1.72m   BUN/Creatinine Ratio SEE NOTE: 6 - 22 (calc)   Sodium 138 135 - 146 mmol/L   Potassium 4.0 3.5 - 5.3 mmol/L   Chloride 103 98 - 110 mmol/L   CO2 26 20 - 32 mmol/L   Calcium 9.6 8.6 - 10.3 mg/dL   Total Protein 6.3 6.1 - 8.1 g/dL   Albumin 3.8 3.6 - 5.1 g/dL   Globulin 2.5 1.9 - 3.7 g/dL (calc)   AG Ratio 1.5 1.0 - 2.5 (calc)   Total Bilirubin 0.7 0.2 - 1.2 mg/dL   Alkaline phosphatase (APISO) 82 35 - 144 U/L   AST 15 10 - 35 U/L   ALT 16 9 - 46 U/L      Assessment & Plan:   Problem List Items Addressed This Visit     BPH associated with nocturia    BPH mostly controlled on Tamsulosin S/p procedural laser for BPH 2010 Elevated PSA 3-4 range now still following, with some improvement 4.06 to 3.85 has apt  w/ Urology June 2024, discussed that hopefully  stable trend, he may have [place] check PSA instead of Urology in future      Centrilobular emphysema [facility])    Currently doing well without flare up.      Essential hypertension    Controlled Home BP reviewed No known complications     Plan:  1. Continue current BP regimen - Losartan 52m and HCTZ 12.587mdaily, Amlodipine 74m33maily 2. Encourage improved lifestyle - low sodium diet, regular exercise 3. Continue monitor BP outside office, bring readings to next visit, if persistently >140/90 or new symptoms notify office sooner      Hyperlipidemia associated with type 2 diabetes mellitus (HCCHoratio  Controlled cholesterol on statin lifestyle  Plan: 1. Continue current meds - Rosuvastatin 74mg47mily 2. Encourage improved lifestyle - low carb/cholesterol, reduce portion size, continue improving regular exercise      Relevant Medications   metFORMIN (GLUCOPHAGE-XR) 500 MG 24 hr tablet   Type 2 diabetes mellitus with other specified complication (HCC)    A1c H7W controlled, improved Complications - hyperlipidemia increases risk of future cardiovascular complications   Plan:  1. REDUCE dose back to previous therapy - Metformin XR 500mg16mly , new rx sent. 2. Encourage improved lifestyle - low carb, low sugar diet, reduce portion size, continue improving regular exercise 3. Check CBG, bring log to next visit for review 4. Continue ARB, Statin  DM Foot completed, request copy DM Eye      Relevant Medications   metFORMIN (GLUCOPHAGE-XR) 500 MG 24 hr tablet   ONETOUCH ULTRA test strip   Other Relevant Orders   Urine Microalbumin w/creat. ratio   Other Visit Diagnoses     Annual physical exam    -  Primary       Updated Health Maintenance information Reviewed recent lab results with patient Encouraged improvement to lifestyle with diet and exercise Goal of weight loss  Ordered to [facility] switch all meds if he  requests.   Meds ordered this encounter  Medications   metFORMIN (GLUCOPHAGE-XR) 500 MG 24 hr tablet    Sig: Take 1 tablet (500 mg total) by mouth daily with breakfast.    Dispense:  90 tablet    Refill:  3   ONETOUCH ULTRA test strip    Sig: Use to check blood sugar up to twice per day    Dispense:  200 each    Refill:  5    OneTouch ultra 2 E11.69     Follow up plan: Return in about 6 months (around 12/23/2022) for 6 month follow-up DM A1c, HTN.  [facility] 06/24/2022, 9:12 AM

## 2022-06-24 NOTE — Assessment & Plan Note (Signed)
Controlled cholesterol on statin lifestyle  Plan: 1. Continue current meds - Rosuvastatin '5mg'$  daily 2. Encourage improved lifestyle - low carb/cholesterol, reduce portion size, continue improving regular exercise

## 2022-06-24 NOTE — Assessment & Plan Note (Signed)
A1c 6.3 controlled, improved Complications - hyperlipidemia increases risk of future cardiovascular complications   Plan:  1. REDUCE dose back to previous therapy - Metformin XR '500mg'$  daily , new rx sent. 2. Encourage improved lifestyle - low carb, low sugar diet, reduce portion size, continue improving regular exercise 3. Check CBG, bring log to next visit for review 4. Continue ARB, Statin  DM Foot completed, request copy DM Eye

## 2022-06-24 NOTE — Assessment & Plan Note (Signed)
Currently doing well without flare up.

## 2022-06-24 NOTE — Progress Notes (Signed)
   REFERRING PHYSICIAN:  Andee Poles [place],  [place] [address]  DOS: 05/14/22  L4-5 TLIF, R L5/S1 decompression   HISTORY OF PRESENT ILLNESS: Isaac Weber is status post L4-5 TLIF, R L5/S1 decompression.  His pain is much better than it was prior to surgery.  He is having some back pain, but overall doing very well.   He is having some mobility issues with his right arm.  He cannot lift his right arm above his shoulder.  He is having some pain into his right trapezius area.  PHYSICAL EXAMINATION:  General: Patient is well developed, well nourished, calm, collected, and in no apparent distress.   NEUROLOGICAL:  General: In no acute distress.   Awake, alert, oriented to person, place, and time.  Pupils equal round and reactive to light.  Facial tone is symmetric.     Strength:            Side Iliopsoas Quads Hamstring PF DF EHL  R '5 5 5 5 5 5  '$ L '5 5 5 5 5 5   '$ Incisions are c/d/i He has diminished active range of motion his right shoulder.  ROS (Neurologic):  Negative except as noted above  IMAGING: No complications noted  ASSESSMENT/PLAN:  Isaac Weber is doing very well s/p above surgery.  I am very pleased with his recovery from low back surgery.  I think he will continue to improve.  I think he either has right rotator cuff issues or a right C4-C5 radiculopathy.  I would like to send him for physical therapy to see if his pain can be alleviated.  If he cannot, we will discuss epidural steroid injection versus reevaluation of the shoulder with Dr. Zigmund Daniel.  I will see him back in 6 weeks with x-rays.   Isaac Maw MD Department of neurosurgery

## 2022-06-24 NOTE — Assessment & Plan Note (Signed)
BPH mostly controlled on Tamsulosin S/p procedural laser for BPH 2010 Elevated PSA 3-4 range now still following, with some improvement 4.06 to 3.85 has apt w/ Urology June 2024, discussed that hopefully stable trend, he may have [place] check PSA instead of Urology in future

## 2022-06-25 ENCOUNTER — Other Ambulatory Visit: Payer: Self-pay

## 2022-06-25 LAB — MICROALBUMIN / CREATININE URINE RATIO
Creatinine, Urine: 151 mg/dL (ref 20–320)
Microalb Creat Ratio: 3 mcg/mg creat (ref <30)
Microalb, Ur: 0.5 mg/dL

## 2022-07-02 ENCOUNTER — Encounter: Payer: Self-pay | Admitting: Neurosurgery

## 2022-07-05 DIAGNOSIS — M5417 Radiculopathy, lumbosacral region: Secondary | ICD-10-CM | POA: Diagnosis not present

## 2022-07-05 DIAGNOSIS — M9903 Segmental and somatic dysfunction of lumbar region: Secondary | ICD-10-CM | POA: Diagnosis not present

## 2022-07-05 DIAGNOSIS — M9905 Segmental and somatic dysfunction of pelvic region: Secondary | ICD-10-CM | POA: Diagnosis not present

## 2022-07-05 DIAGNOSIS — M5416 Radiculopathy, lumbar region: Secondary | ICD-10-CM | POA: Diagnosis not present

## 2022-07-06 ENCOUNTER — Ambulatory Visit: Payer: PPO | Attending: Neurosurgery | Admitting: Physical Therapy

## 2022-07-06 DIAGNOSIS — M25611 Stiffness of right shoulder, not elsewhere classified: Secondary | ICD-10-CM | POA: Diagnosis not present

## 2022-07-06 DIAGNOSIS — M25511 Pain in right shoulder: Secondary | ICD-10-CM | POA: Diagnosis not present

## 2022-07-06 DIAGNOSIS — G8929 Other chronic pain: Secondary | ICD-10-CM | POA: Insufficient documentation

## 2022-07-06 DIAGNOSIS — M249 Joint derangement, unspecified: Secondary | ICD-10-CM | POA: Diagnosis not present

## 2022-07-06 DIAGNOSIS — M5412 Radiculopathy, cervical region: Secondary | ICD-10-CM | POA: Insufficient documentation

## 2022-07-06 DIAGNOSIS — M542 Cervicalgia: Secondary | ICD-10-CM | POA: Insufficient documentation

## 2022-07-06 DIAGNOSIS — M6281 Muscle weakness (generalized): Secondary | ICD-10-CM | POA: Insufficient documentation

## 2022-07-06 DIAGNOSIS — M436 Torticollis: Secondary | ICD-10-CM | POA: Insufficient documentation

## 2022-07-07 DIAGNOSIS — M5416 Radiculopathy, lumbar region: Secondary | ICD-10-CM | POA: Diagnosis not present

## 2022-07-07 DIAGNOSIS — M9905 Segmental and somatic dysfunction of pelvic region: Secondary | ICD-10-CM | POA: Diagnosis not present

## 2022-07-07 DIAGNOSIS — M9903 Segmental and somatic dysfunction of lumbar region: Secondary | ICD-10-CM | POA: Diagnosis not present

## 2022-07-07 DIAGNOSIS — M5417 Radiculopathy, lumbosacral region: Secondary | ICD-10-CM | POA: Diagnosis not present

## 2022-07-08 NOTE — Therapy (Signed)
OUTPATIENT PHYSICAL THERAPY SHOULDER EVALUATION   Patient Name: Isaac Weber MRN: [id number] DOB:05/07/1946, 76 y.o., male Today's Date: 07/08/2022  END OF SESSION:  PT End of Session - 07/08/22 1722     Visit Number 1    Number of Visits 8    Date for PT Re-Evaluation 08/03/22    PT Start Time 0354    PT Stop Time 6568    PT Time Calculation (min) 52 min             Past Medical History:  Diagnosis Date   Abnormal chest CT    Actinic keratosis    Allergic rhinitis, unspecified    Anemia    Aortic atherosclerosis (HCC)    Arthritis    BPH (benign prostatic hyperplasia)    COPD (chronic obstructive pulmonary disease) (HCC)    Coronary artery disease    Cough variant asthma    Diabetes mellitus without complication (HCC)    Glaucoma    Hypertension    Other chronic pain    Pancreatic lesion    Pneumonia    Pure hypercholesterolemia, unspecified    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 WD SCC, ulcerated. [facility] 04/29/2020   Past Surgical History:  Procedure Laterality Date   APPLICATION OF INTRAOPERATIVE CT SCAN N/A 05/12/2022   Procedure: APPLICATION OF INTRAOPERATIVE CT SCAN;  Surgeon: Meade Maw, MD;  Location: [facility] ORS;  Service: Neurosurgery;  Laterality: N/A;   BACK SURGERY     BRONCHIAL WASHINGS  11/26/2021   Procedure: BRONCHIAL WASHINGS;  Surgeon: Brand Males, MD;  Location: [facility];  Service: Endoscopy;;   BUNIONECTOMY Left 2003   hammer toe as well   BUNIONECTOMY WITH HAMMERTOE RECONSTRUCTION Left 2013   repeat   CERVICAL DISC SURGERY     COLONOSCOPY WITH PROPOFOL N/A 04/30/2021   Procedure: COLONOSCOPY WITH PROPOFOL;  Surgeon: Jonathon Bellows,  MD;  Location: [facility] ENDOS[facility];  Service: Gastroenterology;  Laterality: N/A;   GREEN LIGHT LASER TURP (TRANSURETHRAL RESECTION OF PROSTATE  2010   laser, shrink prostate   KNEE ARTHROSCOPY Right 1991   LEG SURGERY Left    BENIGN BONE TUMOR   LUMBAR [place] SURGERY  2010   discectomy   LUMBAR DISC SURGERY  2011   LUMBAR LAMINECTOMY/DECOMPRESSION MICRODISCECTOMY Right 05/12/2022   Procedure: RIGHT L5-S1 DISCECTOMY;  Surgeon: Meade Maw, MD;  Location: [facility] OR[facility];  Service: Neurosurgery;  Laterality: Right;   PROSTATE SURGERY  2002   shrink prostate   ROTATOR CUFF REPAIR Left 1999   debride, remove bonespur   ROTATOR CUFF REPAIR Right 1996   TONSILLECTOMY     TRANSFORAMINAL LUMBAR INTERBODY FUSION W/ MIS 1 LEVEL Right 05/12/2022   Procedure: RIGHT L4-5 MINIMALLY INVASIVE (MIS) TRANSFORAMINAL LUMBAR INTERBODY FUSION (TLIF);  Surgeon: Meade Maw, MD;  Location: [facility] ORS;  Service: Neurosurgery;  Laterality: Right;   VIDEO BRONCHOSCOPY N/A 11/26/2021   Procedure: VIDEO BRONCHOSCOPY WITHOUT FLUORO;  Surgeon: Brand Males, MD;  Location: [facility];  Service: Endoscopy;  Laterality: N/A;  for chroni ccough BAL   Patient Active Problem List   Diagnosis Date Noted   Spondylolisthesis at L4-L5 level 05/12/2022   Chronic right-sided low back pain with right-sided  sciatica 05/12/2022   Spondylolisthesis of lumbar region 05/12/2022   Other chronic pain    Pre-operative respiratory examination 04/26/2022   Cervical facet joint syndrome 03/09/2022   Osteoarthritis of glenohumeral joint, right 03/02/2022   Cervical spondylosis with radiculopathy 03/02/2022   Lower extremity edema 01/18/2022   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 11/17/2021   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) 11/17/2021   Lumbar radiculopathy 10/27/2021   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   History of lumbar surgery 10/27/2021   Chronic pain syndrome 10/27/2021    Postnasal drip 10/06/2021   Derangement of right SI joint 09/28/2021   Spondylosis of lumbosacral region without myelopathy or radiculopathy 09/28/2021   Biceps tendinitis, right 08/26/2021   Supraspinatus syndrome, right 08/26/2021   Deviated septum 07/28/2021   History of chronic sinusitis 07/28/2021   Chronic pansinusitis 05/21/2021   Rhinitis 02/04/2021   Abnormal findings on diagnostic imaging of lung 07/31/2020   Chronic cough 07/31/2020   Centrilobular emphysema ([place]) 07/31/2020   Healthcare maintenance 07/31/2020   Type 2 diabetes mellitus with other specified complication ([place]) 01/74/9449   Kidney cysts 01/11/2020   Pancreatic cyst 01/11/2020   Iron deficiency anemia 01/11/2020   Hyperlipidemia associated with type 2 diabetes mellitus ([place]) 01/11/2020   Primary osteoarthritis involving multiple joints 01/11/2020   BPH associated with nocturia 01/11/2020   Cough variant asthma 08/17/2019   Essential hypertension     PCP: Olin Hauser, DO  REFERRING PROVIDER: Meade Maw, MD  REFERRING DIAG: Chronic R shoulder pain/ Cervical radiculitis  THERAPY DIAG:  Chronic right shoulder pain  Shoulder joint stiffness, right  Muscle weakness (generalized)  Cervicalgia  Neck stiffness  Rationale for Evaluation and Treatment: Rehabilitation  ONSET DATE: Chronic  SUBJECTIVE:                                                                                                                                                                                      SUBJECTIVE STATEMENT: Pt. Reports 7/10 R shoulder pain at worst with reaching and 0/10 at best/ rest.  Pt. Has a 25# lifting restriction secondary to low back surgery (see MD notes).  Pt. Sees Tim [facility]) for back/ hip adjustments.   PERTINENT HISTORY: See MD notes.  Recert lumbar surgery/ previous cervical surgery.    PAIN:  Are you having pain? Yes: NPRS scale: 7/10 Pain location: R  shoulder Pain description: sharp Aggravating factors: reaching Relieving factors: rest  PRECAUTIONS: Back  WEIGHT BEARING RESTRICTIONS: No  FALLS:  Has patient fallen in last 6 months? No  LIVING ENVIRONMENT: Lives with: lives with their spouse Lives in: House/apartment  OCCUPATION: Retired.  Pt. Known to PT clinic/ see previous notes  PLOF: Independent  PATIENT GOALS: Decrease neck/ R shoulder pain with daily tasks.   NEXT MD VISIT: 08/03/22 with Dr. Izora Ribas  OBJECTIVE:   DIAGNOSTIC FINDINGS:  Narrative & Impression  CLINICAL DATA:  Neck pain, chronic, degenerative changes on xray. Prior C-spine fusion, now having severe right neck and shoulder pain, concern for C4/5 radiculopathy.   EXAM: MRI CERVICAL SPINE WITHOUT AND WITH CONTRAST   TECHNIQUE: Multiplanar and multiecho pulse sequences of the cervical spine, to include the craniocervical junction and cervicothoracic junction, were obtained without and with intravenous contrast.   CONTRAST:  38m GADAVIST GADOBUTROL 1 MMOL/ML IV SOLN   COMPARISON:  Cervical spine radiographs 03/02/2022   FINDINGS: Alignment: Minimal anterolisthesis of C3 on C4, C4 on C5, and C5 on C6.   Vertebrae: No fracture, suspicious marrow lesion, or significant marrow edema. Solid interbody osseous fusion at C5-6 and C6-7.   Cord: Normal cord signal and morphology.  No abnormal enhancement.   Posterior Fossa, vertebral arteries, paraspinal tissues: Unremarkable.   Disc levels:   C2-3: Minimal disc bulging, uncovertebral spurring, and moderate to severe right and mild left facet arthrosis without significant stenosis.   C3-4: Mild disc space narrowing. Anterolisthesis with bulging uncovered disc, uncovertebral spurring, and severe facet arthrosis result in mild spinal stenosis and moderate bilateral neural foraminal stenosis.   C4-5: Mild disc space narrowing. Anterolisthesis with bulging uncovered disc, asymmetric right  uncovertebral spurring, and severe right and moderate left facet arthrosis result in mild spinal stenosis and moderate right neural foraminal stenosis.   C5-6: Anterior fusion.  No stenosis.   C6-7: Anterior fusion.  No stenosis.   C7-T1: Moderate disc space narrowing. Disc bulging, uncovertebral spurring, and mild-to-moderate facet arthrosis result in mild-to-moderate right neural foraminal stenosis without spinal stenosis.   IMPRESSION: 1. Solid anterior fusion at C5-6 and C6-7 without residual stenosis. 2. Severe upper cervical facet arthrosis with mild spinal stenosis and moderate neural foraminal stenosis at C3-4 and C4-5. 3. Mild-to-moderate right neural foraminal stenosis at C7-T1.      PATIENT SURVEYS:  FOTO initial 13/ goal 546 COGNITION: Overall cognitive status: Within functional limits for tasks assessed     SENSATION: WFL  POSTURE: Rounded shoulder/ forward head posture.  UPPER EXTREMITY ROM:   Active ROM Right eval Left eval  Shoulder flexion 72 deg. 152 deg.  Shoulder extension    Shoulder abduction 68 deg. 146 deg.  Shoulder adduction    Shoulder internal rotation 50 deg. 70 deg.  Shoulder external rotation 50 deg. 74 deg.  Elbow flexion WNL WNL  Elbow extension WNL WNL  Wrist flexion WNL WNL  Wrist extension WNL WNL  Wrist ulnar deviation    Wrist radial deviation    Wrist pronation    Wrist supination    (Blank rows = not tested)  R shoulder flexion 138 deg. Passive.  Cervical AROM: flexion 25 deg./ extension 60 deg./ L rotn. 64 deg./ R rotn. 64 deg./ L lat. Flex. 24 deg./ R lat. Flex. 32 deg.    UPPER EXTREMITY MMT:  Next tx session  SHOULDER SPECIAL TESTS: Limited R shoulder ROM limits testing  JOINT MOBILITY TESTING:  Limited  PALPATION:  (+) R posterior deltoid tenderness with palpation.    TODAY'S TREATMENT:  DATE: 07/06/22  Evaluation only  PATIENT EDUCATION: Education details: Discussed posture/ lifting limitations Person educated: Patient Education method: Explanation Education comprehension: verbalized understanding  HOME EXERCISE PROGRAM: Will issue HEP next tx.   ASSESSMENT:  CLINICAL IMPRESSION: Patient is a pleasant 76 y.o. male who was seen today for physical therapy evaluation and treatment for R shoulder/ neck pain.   Pt. Presents with R shoulder ROM limitations and R neck/ shoulder pain.  Pt. Will benefit from skilled PT services to develop HEP to improve pain-free mobility.    OBJECTIVE IMPAIRMENTS: decreased activity tolerance, decreased mobility, decreased ROM, decreased strength, hypomobility, impaired flexibility, improper body mechanics, postural dysfunction, and pain.   ACTIVITY LIMITATIONS: carrying, lifting, standing, reach over head, and hygiene/grooming  PARTICIPATION LIMITATIONS: interpersonal relationship and community activity  PERSONAL FACTORS: Age and Past/current experiences are also affecting patient's functional outcome.   REHAB POTENTIAL: Good  CLINICAL DECISION MAKING: Evolving/moderate complexity  EVALUATION COMPLEXITY: Moderate   GOALS: Goals reviewed with patient? Yes  SHORT TERM GOALS: Target date: 07/20/22  Pt. Independent with HEP to increase cervical AROM to [facility] to improve painfree mobility.  Baseline: See above Goal status: INITIAL   LONG TERM GOALS: Target date: 08/03/22  Pt. Will increase FOTO to 52 to improve pain-free mobility.  Baseline: initial FOTO 13 Goal status: INITIAL  2.  Pt. Will report <4/10 R shoulder pain with forward reaching and carrying <20# objects to improve household tasks.  Baseline:  7/10 pain with R shoulder use Goal status: INITIAL  3.  Pt. Will be able to complete 30 minutes of exercise at gym with no increase c/o neck/ R shoulder pain.   Baseline:  pain limited Goal status:  INITIAL   PLAN:  PT FREQUENCY: 2x/week  PT DURATION: 4 weeks  PLANNED INTERVENTIONS: Therapeutic exercises, Therapeutic activity, Neuromuscular re-education, Patient/Family education, Self Care, Joint mobilization, Dry Needling, Electrical stimulation, Cryotherapy, Moist heat, Manual therapy, and Re-evaluation  PLAN FOR NEXT SESSION: Issue HEP/ check MMT  Pura Spice, PT, DPT # [phone] 07/08/2022, 5:25 PM

## 2022-07-09 ENCOUNTER — Other Ambulatory Visit: Payer: Self-pay | Admitting: Neurosurgery

## 2022-07-09 ENCOUNTER — Ambulatory Visit: Payer: PPO | Admitting: Physical Therapy

## 2022-07-09 DIAGNOSIS — G8929 Other chronic pain: Secondary | ICD-10-CM

## 2022-07-09 DIAGNOSIS — M25611 Stiffness of right shoulder, not elsewhere classified: Secondary | ICD-10-CM

## 2022-07-09 DIAGNOSIS — M249 Joint derangement, unspecified: Secondary | ICD-10-CM

## 2022-07-09 DIAGNOSIS — M542 Cervicalgia: Secondary | ICD-10-CM

## 2022-07-09 DIAGNOSIS — M25511 Pain in right shoulder: Secondary | ICD-10-CM | POA: Diagnosis not present

## 2022-07-09 DIAGNOSIS — M6281 Muscle weakness (generalized): Secondary | ICD-10-CM

## 2022-07-09 DIAGNOSIS — M436 Torticollis: Secondary | ICD-10-CM

## 2022-07-09 NOTE — Therapy (Signed)
OUTPATIENT PHYSICAL THERAPY SHOULDER TREATMENT   Patient Name: Isaac Weber MRN: [id number] DOB:Aug 21, 1945, 76 y.o., male Today's Date: 07/09/2022  END OF SESSION:  PT End of Session - 07/09/22 1236     Visit Number 2    Number of Visits 8    Date for PT Re-Evaluation 08/03/22    PT Start Time 70    PT Stop Time 8325    PT Time Calculation (min) 52 min             Past Medical History:  Diagnosis Date   Abnormal chest CT    Actinic keratosis    Allergic rhinitis, unspecified    Anemia    Aortic atherosclerosis ([place])    Arthritis    BPH (benign prostatic hyperplasia)    COPD (chronic obstructive pulmonary disease) (HCC)    Coronary artery disease    Cough variant asthma    Diabetes mellitus without complication (HCC)    Glaucoma    Hypertension    Other chronic pain    Pancreatic lesion    Pneumonia    Pure hypercholesterolemia, unspecified    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 WD SCC, ulcerated. [facility] 04/29/2020   Past Surgical History:  Procedure Laterality Date   APPLICATION OF INTRAOPERATIVE CT SCAN N/A 05/12/2022   Procedure: APPLICATION OF INTRAOPERATIVE CT SCAN;  Surgeon: Meade Maw, MD;  Location: [facility] ORS;  Service: Neurosurgery;  Laterality: N/A;   BACK SURGERY     BRONCHIAL WASHINGS  11/26/2021   Procedure: BRONCHIAL WASHINGS;  Surgeon: Brand Males, MD;  Location: [facility];  Service: Endoscopy;;   BUNIONECTOMY Left 2003   hammer toe as well   BUNIONECTOMY WITH HAMMERTOE RECONSTRUCTION Left 2013   repeat   CERVICAL DISC SURGERY     COLONOSCOPY WITH PROPOFOL N/A 04/30/2021   Procedure: COLONOSCOPY WITH PROPOFOL;  Surgeon: Jonathon Bellows, MD;   Location: [facility] ENDOSCOPY;  Service: Gastroenterology;  Laterality: N/A;   GREEN LIGHT LASER TURP (TRANSURETHRAL RESECTION OF PROSTATE  2010   laser, shrink prostate   KNEE ARTHROSCOPY Right 1991   LEG SURGERY Left    BENIGN BONE TUMOR   LUMBAR [place] SURGERY  2010   discectomy   LUMBAR DISC SURGERY  2011   LUMBAR LAMINECTOMY/DECOMPRESSION MICRODISCECTOMY Right 05/12/2022   Procedure: RIGHT L5-S1 DISCECTOMY;  Surgeon: Meade Maw, MD;  Location: [facility] ORS;  Service: Neurosurgery;  Laterality: Right;   PROSTATE SURGERY  2002   shrink prostate   ROTATOR CUFF REPAIR Left 1999   debride, remove bonespur   ROTATOR CUFF REPAIR Right 1996   TONSILLECTOMY     TRANSFORAMINAL LUMBAR INTERBODY FUSION W/ MIS 1 LEVEL Right 05/12/2022   Procedure: RIGHT L4-5 MINIMALLY INVASIVE (MIS) TRANSFORAMINAL LUMBAR INTERBODY FUSION (TLIF);  Surgeon: Meade Maw, MD;  Location: [facility] ORS;  Service: Neurosurgery;  Laterality: Right;   VIDEO BRONCHOSCOPY N/A 11/26/2021   Procedure: VIDEO BRONCHOSCOPY WITHOUT FLUORO;  Surgeon: Brand Males, MD;  Location: [facility];  Service: Endoscopy;  Laterality: N/A;  for chroni ccough BAL   Patient Active Problem List   Diagnosis Date Noted   Spondylolisthesis at L4-L5 level 05/12/2022   Chronic right-sided low back pain with right-sided  sciatica 05/12/2022   Spondylolisthesis of lumbar region 05/12/2022   Other chronic pain    Pre-operative respiratory examination 04/26/2022   Cervical facet joint syndrome 03/09/2022   Osteoarthritis of glenohumeral joint, right 03/02/2022   Cervical spondylosis with radiculopathy 03/02/2022   Lower extremity edema 01/18/2022   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 11/17/2021   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) 11/17/2021   Lumbar radiculopathy 10/27/2021   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   History of lumbar surgery 10/27/2021   Chronic pain syndrome 10/27/2021   Postnasal  drip 10/06/2021   Derangement of right SI joint 09/28/2021   Spondylosis of lumbosacral region without myelopathy or radiculopathy 09/28/2021   Biceps tendinitis, right 08/26/2021   Supraspinatus syndrome, right 08/26/2021   Deviated septum 07/28/2021   History of chronic sinusitis 07/28/2021   Chronic pansinusitis 05/21/2021   Rhinitis 02/04/2021   Abnormal findings on diagnostic imaging of lung 07/31/2020   Chronic cough 07/31/2020   Centrilobular emphysema ([place]) 07/31/2020   Healthcare maintenance 07/31/2020   Type 2 diabetes mellitus with other specified complication ([place]) 16/38/4665   Kidney cysts 01/11/2020   Pancreatic cyst 01/11/2020   Iron deficiency anemia 01/11/2020   Hyperlipidemia associated with type 2 diabetes mellitus ([place]) 01/11/2020   Primary osteoarthritis involving multiple joints 01/11/2020   BPH associated with nocturia 01/11/2020   Cough variant asthma 08/17/2019   Essential hypertension     PCP: Olin Hauser, DO  REFERRING PROVIDER: Meade Maw, MD  REFERRING DIAG: Chronic R shoulder pain/ Cervical radiculitis  THERAPY DIAG:  Chronic right shoulder pain  Shoulder joint stiffness, right  Muscle weakness (generalized)  Cervicalgia  Neck stiffness  Derangement of right SI joint  Rationale for Evaluation and Treatment: Rehabilitation  ONSET DATE: Chronic  SUBJECTIVE:                                                                                                                                                                                      SUBJECTIVE STATEMENT:  EVALUATION Pt. Reports 7/10 R shoulder pain at worst with reaching and 0/10 at best/ rest.  Pt. Has a 25# lifting restriction secondary to low back surgery (see MD notes).  Pt. Sees Tim Beshal [facility]) for back/ hip adjustments.   PERTINENT HISTORY: See MD notes.  Recert lumbar surgery/ previous cervical surgery.    PAIN:  Are you having pain? Yes: NPRS scale:  7/10 Pain location: R shoulder Pain description: sharp Aggravating factors: reaching Relieving factors: rest  PRECAUTIONS: Back  WEIGHT BEARING RESTRICTIONS: No  FALLS:  Has patient fallen in last 6 months? No  LIVING ENVIRONMENT: Lives with: lives with  their spouse Lives in: House/apartment  OCCUPATION: Retired.  Pt. Known to PT clinic/ see previous notes  PLOF: Independent  PATIENT GOALS: Decrease neck/ R shoulder pain with daily tasks.   NEXT MD VISIT: 08/03/22 with Dr. Izora Ribas  OBJECTIVE:   DIAGNOSTIC FINDINGS:  Narrative & Impression  CLINICAL DATA:  Neck pain, chronic, degenerative changes on xray. Prior C-spine fusion, now having severe right neck and shoulder pain, concern for C4/5 radiculopathy.   EXAM: MRI CERVICAL SPINE WITHOUT AND WITH CONTRAST   TECHNIQUE: Multiplanar and multiecho pulse sequences of the cervical spine, to include the craniocervical junction and cervicothoracic junction, were obtained without and with intravenous contrast.   CONTRAST:  43m GADAVIST GADOBUTROL 1 MMOL/ML IV SOLN   COMPARISON:  Cervical spine radiographs 03/02/2022   FINDINGS: Alignment: Minimal anterolisthesis of C3 on C4, C4 on C5, and C5 on C6.   Vertebrae: No fracture, suspicious marrow lesion, or significant marrow edema. Solid interbody osseous fusion at C5-6 and C6-7.   Cord: Normal cord signal and morphology.  No abnormal enhancement.   Posterior Fossa, vertebral arteries, paraspinal tissues: Unremarkable.   Disc levels:   C2-3: Minimal disc bulging, uncovertebral spurring, and moderate to severe right and mild left facet arthrosis without significant stenosis.   C3-4: Mild disc space narrowing. Anterolisthesis with bulging uncovered disc, uncovertebral spurring, and severe facet arthrosis result in mild spinal stenosis and moderate bilateral neural foraminal stenosis.   C4-5: Mild disc space narrowing. Anterolisthesis with bulging uncovered  disc, asymmetric right uncovertebral spurring, and severe right and moderate left facet arthrosis result in mild spinal stenosis and moderate right neural foraminal stenosis.   C5-6: Anterior fusion.  No stenosis.   C6-7: Anterior fusion.  No stenosis.   C7-T1: Moderate disc space narrowing. Disc bulging, uncovertebral spurring, and mild-to-moderate facet arthrosis result in mild-to-moderate right neural foraminal stenosis without spinal stenosis.   IMPRESSION: 1. Solid anterior fusion at C5-6 and C6-7 without residual stenosis. 2. Severe upper cervical facet arthrosis with mild spinal stenosis and moderate neural foraminal stenosis at C3-4 and C4-5. 3. Mild-to-moderate right neural foraminal stenosis at C7-T1.      PATIENT SURVEYS:  FOTO initial 13/ goal 529 COGNITION: Overall cognitive status: Within functional limits for tasks assessed     SENSATION: WFL  POSTURE: Rounded shoulder/ forward head posture.  UPPER EXTREMITY ROM:   Active ROM Right eval Left eval  Shoulder flexion 72 deg. 152 deg.  Shoulder extension    Shoulder abduction 68 deg. 146 deg.  Shoulder adduction    Shoulder internal rotation 50 deg. 70 deg.  Shoulder external rotation 50 deg. 74 deg.  Elbow flexion WNL WNL  Elbow extension WNL WNL  Wrist flexion WNL WNL  Wrist extension WNL WNL  Wrist ulnar deviation    Wrist radial deviation    Wrist pronation    Wrist supination    (Blank rows = not tested)  R shoulder flexion 138 deg. Passive.  Cervical AROM: flexion 25 deg./ extension 60 deg./ L rotn. 64 deg./ R rotn. 64 deg./ L lat. Flex. 24 deg./ R lat. Flex. 32 deg.    UPPER EXTREMITY MMT:  Next tx session  SHOULDER SPECIAL TESTS: Limited R shoulder ROM limits testing  JOINT MOBILITY TESTING:  Limited  PALPATION:  (+) R posterior deltoid tenderness with palpation.    TODAY'S TREATMENT:  DATE: 07/09/22   SUBJECTIVE:  Pt. Reports no R shoulder pain at rest and >6/10 when reaching forward or overhead.    MH to L neck/ upper trap/ shoulder region in sitting prior to tx.   There.ex.:  Seated wand B shoulder press ups/ chest press/ standing shoulder extension.  Cuing to decrease R UT overcompensation.    Supine L shoulder AROM (flexion/ serratus punches) 10x  Pulley ex. (Issued for HEP)  Manual tx.:   Seated STM to R UT/ posterior deltoid.   Supine cervical stretches (all planes).  R shoulder AA/PROM in supine (static holds as tolerated).    Discussed household/ daily tasks.     PATIENT EDUCATION: Education details: Discussed posture/ lifting limitations Person educated: Patient Education method: Explanation Education comprehension: verbalized understanding  HOME EXERCISE PROGRAM: Pulley ex. (Flexion/ abduction)- pain-free range   ASSESSMENT:  CLINICAL IMPRESSION: Pt. presents with R shoulder ROM limitations and R neck/ shoulder pain.  Pt. Has no R shoulder pain at rest but marked increase in pain with active shoulder flexion/ reaching.  Pt able to complete passive pulley ex. With no pain.  Good cervical AROM/ stretches today.  Issued pulley ex. For home.    Pt. Will benefit from skilled PT services to develop HEP to improve pain-free mobility.    OBJECTIVE IMPAIRMENTS: decreased activity tolerance, decreased mobility, decreased ROM, decreased strength, hypomobility, impaired flexibility, improper body mechanics, postural dysfunction, and pain.   ACTIVITY LIMITATIONS: carrying, lifting, standing, reach over head, and hygiene/grooming  PARTICIPATION LIMITATIONS: interpersonal relationship and community activity  PERSONAL FACTORS: Age and Past/current experiences are also affecting patient's functional outcome.   REHAB POTENTIAL: Good  CLINICAL DECISION MAKING: Evolving/moderate complexity  EVALUATION COMPLEXITY:  Moderate   GOALS: Goals reviewed with patient? Yes  SHORT TERM GOALS: Target date: 07/20/22  Pt. Independent with HEP to increase cervical AROM to [facility] to improve painfree mobility.  Baseline: See above Goal status: INITIAL   LONG TERM GOALS: Target date: 08/03/22  Pt. Will increase FOTO to 52 to improve pain-free mobility.  Baseline: initial FOTO 13 Goal status: INITIAL  2.  Pt. Will report <4/10 R shoulder pain with forward reaching and carrying <20# objects to improve household tasks.  Baseline:  7/10 pain with R shoulder use Goal status: INITIAL  3.  Pt. Will be able to complete 30 minutes of exercise at gym with no increase c/o neck/ R shoulder pain.   Baseline:  pain limited Goal status: INITIAL   PLAN:  PT FREQUENCY: 2x/week  PT DURATION: 4 weeks  PLANNED INTERVENTIONS: Therapeutic exercises, Therapeutic activity, Neuromuscular re-education, Patient/Family education, Self Care, Joint mobilization, Dry Needling, Electrical stimulation, Cryotherapy, Moist heat, Manual therapy, and Re-evaluation  PLAN FOR NEXT SESSION: Check MMT  Pura Spice, PT, DPT # [phone] 07/09/2022, 5:05 PM

## 2022-07-12 DIAGNOSIS — M5416 Radiculopathy, lumbar region: Secondary | ICD-10-CM | POA: Diagnosis not present

## 2022-07-12 DIAGNOSIS — M9903 Segmental and somatic dysfunction of lumbar region: Secondary | ICD-10-CM | POA: Diagnosis not present

## 2022-07-12 DIAGNOSIS — M5417 Radiculopathy, lumbosacral region: Secondary | ICD-10-CM | POA: Diagnosis not present

## 2022-07-12 DIAGNOSIS — M9905 Segmental and somatic dysfunction of pelvic region: Secondary | ICD-10-CM | POA: Diagnosis not present

## 2022-07-13 ENCOUNTER — Ambulatory Visit: Payer: PPO | Admitting: Physical Therapy

## 2022-07-13 DIAGNOSIS — M6281 Muscle weakness (generalized): Secondary | ICD-10-CM

## 2022-07-13 DIAGNOSIS — G8929 Other chronic pain: Secondary | ICD-10-CM

## 2022-07-13 DIAGNOSIS — M25511 Pain in right shoulder: Secondary | ICD-10-CM | POA: Diagnosis not present

## 2022-07-13 DIAGNOSIS — M25611 Stiffness of right shoulder, not elsewhere classified: Secondary | ICD-10-CM

## 2022-07-13 DIAGNOSIS — M436 Torticollis: Secondary | ICD-10-CM

## 2022-07-13 DIAGNOSIS — M542 Cervicalgia: Secondary | ICD-10-CM

## 2022-07-13 NOTE — Therapy (Signed)
OUTPATIENT PHYSICAL THERAPY SHOULDER TREATMENT  Patient Name: Isaac Weber MRN: [id number] DOB:Nov 01, 1945, 76 y.o., male Today's Date: 07/13/2022  END OF SESSION:  PT End of Session - 07/13/22 1506     Visit Number 3    Number of Visits 8    Date for PT Re-Evaluation 08/03/22    PT Start Time 3546    PT Stop Time 1556    PT Time Calculation (min) 50 min             Past Medical History:  Diagnosis Date   Abnormal chest CT    Actinic keratosis    Allergic rhinitis, unspecified    Anemia    Aortic atherosclerosis (HCC)    Arthritis    BPH (benign prostatic hyperplasia)    COPD (chronic obstructive pulmonary disease) (HCC)    Coronary artery disease    Cough variant asthma    Diabetes mellitus without complication (HCC)    Glaucoma    Hypertension    Other chronic pain    Pancreatic lesion    Pneumonia    Pure hypercholesterolemia, unspecified    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 WD SCC, ulcerated. [facility] 04/29/2020   Past Surgical History:  Procedure Laterality Date   APPLICATION OF INTRAOPERATIVE CT SCAN N/A 05/12/2022   Procedure: APPLICATION OF INTRAOPERATIVE CT SCAN;  Surgeon: Meade Maw, MD;  Location: [facility] ORS;  Service: Neurosurgery;  Laterality: N/A;   BACK SURGERY     BRONCHIAL WASHINGS  11/26/2021   Procedure: BRONCHIAL WASHINGS;  Surgeon: Brand Males, MD;  Location: [facility];  Service: Endoscopy;;   BUNIONECTOMY Left 2003   hammer toe as well   BUNIONECTOMY WITH HAMMERTOE RECONSTRUCTION Left 2013   repeat   CERVICAL DISC SURGERY     COLONOSCOPY WITH PROPOFOL N/A 04/30/2021   Procedure: COLONOSCOPY WITH PROPOFOL;  Surgeon: Jonathon Bellows, MD;   Location: [facility] ENDOS[facility];  Service: Gastroenterology;  Laterality: N/A;   GREEN LIGHT LASER TURP (TRANSURETHRAL RESECTION OF PROSTATE  2010   laser, shrink prostate   KNEE ARTHROSCOPY Right 1991   LEG SURGERY Left    BENIGN BONE TUMOR   LUMBAR [place] SURGERY  2010   discectomy   LUMBAR DISC SURGERY  2011   LUMBAR LAMINECTOMY/DECOMPRESSION MICRODISCECTOMY Right 05/12/2022   Procedure: RIGHT L5-S1 DISCECTOMY;  Surgeon: Meade Maw, MD;  Location: [facility];  Service: Neurosurgery;  Laterality: Right;   PROSTATE SURGERY  2002   shrink prostate   ROTATOR CUFF REPAIR Left 1999   debride, remove bonespur   ROTATOR CUFF REPAIR Right 1996   TONSILLECTOMY     TRANSFORAMINAL LUMBAR INTERBODY FUSION W/ MIS 1 LEVEL Right 05/12/2022   Procedure: RIGHT L4-5 MINIMALLY INVASIVE (MIS) TRANSFORAMINAL LUMBAR INTERBODY FUSION (TLIF);  Surgeon: Meade Maw, MD;  Location: [facility];  Service: Neurosurgery;  Laterality: Right;   VIDEO BRONCHOSCOPY N/A 11/26/2021   Procedure: VIDEO BRONCHOSCOPY WITHOUT FLUORO;  Surgeon: Brand Males, MD;  Location: [facility];  Service: Endoscopy;  Laterality: N/A;  for chroni ccough BAL   Patient Active Problem List   Diagnosis Date Noted   Spondylolisthesis at L4-L5 level 05/12/2022   Chronic right-sided low back pain with right-sided sciatica  05/12/2022   Spondylolisthesis of lumbar region 05/12/2022   Other chronic pain    Pre-operative respiratory examination 04/26/2022   Cervical facet joint syndrome 03/09/2022   Osteoarthritis of glenohumeral joint, right 03/02/2022   Cervical spondylosis with radiculopathy 03/02/2022   Lower extremity edema 01/18/2022   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 11/17/2021   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) 11/17/2021   Lumbar radiculopathy 10/27/2021   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   History of lumbar surgery 10/27/2021   Chronic pain syndrome 10/27/2021   Postnasal  drip 10/06/2021   Derangement of right SI joint 09/28/2021   Spondylosis of lumbosacral region without myelopathy or radiculopathy 09/28/2021   Biceps tendinitis, right 08/26/2021   Supraspinatus syndrome, right 08/26/2021   Deviated septum 07/28/2021   History of chronic sinusitis 07/28/2021   Chronic pansinusitis 05/21/2021   Rhinitis 02/04/2021   Abnormal findings on diagnostic imaging of lung 07/31/2020   Chronic cough 07/31/2020   Centrilobular emphysema ([place]) 07/31/2020   Healthcare maintenance 07/31/2020   Type 2 diabetes mellitus with other specified complication ([place]) 54/49/2010   Kidney cysts 01/11/2020   Pancreatic cyst 01/11/2020   Iron deficiency anemia 01/11/2020   Hyperlipidemia associated with type 2 diabetes mellitus ([place]) 01/11/2020   Primary osteoarthritis involving multiple joints 01/11/2020   BPH associated with nocturia 01/11/2020   Cough variant asthma 08/17/2019   Essential hypertension     PCP: Olin Hauser, DO  REFERRING PROVIDER: Meade Maw, MD  REFERRING DIAG: Chronic R shoulder pain/ Cervical radiculitis  THERAPY DIAG:  Chronic right shoulder pain  Shoulder joint stiffness, right  Muscle weakness (generalized)  Cervicalgia  Neck stiffness  Rationale for Evaluation and Treatment: Rehabilitation  ONSET DATE: Chronic  SUBJECTIVE:                                                                                                                                                                                      SUBJECTIVE STATEMENT:  EVALUATION Pt. Reports 7/10 R shoulder pain at worst with reaching and 0/10 at best/ rest.  Pt. Has a 25# lifting restriction secondary to low back surgery (see MD notes).  Pt. Sees [facility]) for back/ hip adjustments.   PERTINENT HISTORY: See MD notes.  Recert lumbar surgery/ previous cervical surgery.    PAIN:  Are you having pain? Yes: NPRS scale: 7/10 Pain location: R  shoulder Pain description: sharp Aggravating factors: reaching Relieving factors: rest  PRECAUTIONS: Back  WEIGHT BEARING RESTRICTIONS: No  FALLS:  Has patient fallen in last 6 months? No  LIVING ENVIRONMENT: Lives with: lives with their spouse Lives in: House/apartment  OCCUPATION:  Retired.  Pt. Known to PT clinic/ see previous notes  PLOF: Independent  PATIENT GOALS: Decrease neck/ R shoulder pain with daily tasks.   NEXT MD VISIT: 08/03/22 with Dr. Izora Ribas  OBJECTIVE:   DIAGNOSTIC FINDINGS:  Narrative & Impression  CLINICAL DATA:  Neck pain, chronic, degenerative changes on xray. Prior C-spine fusion, now having severe right neck and shoulder pain, concern for C4/5 radiculopathy.   EXAM: MRI CERVICAL SPINE WITHOUT AND WITH CONTRAST   TECHNIQUE: Multiplanar and multiecho pulse sequences of the cervical spine, to include the craniocervical junction and cervicothoracic junction, were obtained without and with intravenous contrast.   CONTRAST:  95m GADAVIST GADOBUTROL 1 MMOL/ML IV SOLN   COMPARISON:  Cervical spine radiographs 03/02/2022   FINDINGS: Alignment: Minimal anterolisthesis of C3 on C4, C4 on C5, and C5 on C6.   Vertebrae: No fracture, suspicious marrow lesion, or significant marrow edema. Solid interbody osseous fusion at C5-6 and C6-7.   Cord: Normal cord signal and morphology.  No abnormal enhancement.   Posterior Fossa, vertebral arteries, paraspinal tissues: Unremarkable.   Disc levels:   C2-3: Minimal disc bulging, uncovertebral spurring, and moderate to severe right and mild left facet arthrosis without significant stenosis.   C3-4: Mild disc space narrowing. Anterolisthesis with bulging uncovered disc, uncovertebral spurring, and severe facet arthrosis result in mild spinal stenosis and moderate bilateral neural foraminal stenosis.   C4-5: Mild disc space narrowing. Anterolisthesis with bulging uncovered disc, asymmetric right  uncovertebral spurring, and severe right and moderate left facet arthrosis result in mild spinal stenosis and moderate right neural foraminal stenosis.   C5-6: Anterior fusion.  No stenosis.   C6-7: Anterior fusion.  No stenosis.   C7-T1: Moderate disc space narrowing. Disc bulging, uncovertebral spurring, and mild-to-moderate facet arthrosis result in mild-to-moderate right neural foraminal stenosis without spinal stenosis.   IMPRESSION: 1. Solid anterior fusion at C5-6 and C6-7 without residual stenosis. 2. Severe upper cervical facet arthrosis with mild spinal stenosis and moderate neural foraminal stenosis at C3-4 and C4-5. 3. Mild-to-moderate right neural foraminal stenosis at C7-T1.      PATIENT SURVEYS:  FOTO initial 13/ goal 574 COGNITION: Overall cognitive status: Within functional limits for tasks assessed     SENSATION: WFL  POSTURE: Rounded shoulder/ forward head posture.  UPPER EXTREMITY ROM:   Active ROM Right eval Left eval  Shoulder flexion 72 deg. 152 deg.  Shoulder extension    Shoulder abduction 68 deg. 146 deg.  Shoulder adduction    Shoulder internal rotation 50 deg. 70 deg.  Shoulder external rotation 50 deg. 74 deg.  Elbow flexion WNL WNL  Elbow extension WNL WNL  Wrist flexion WNL WNL  Wrist extension WNL WNL  Wrist ulnar deviation    Wrist radial deviation    Wrist pronation    Wrist supination    (Blank rows = not tested)  R shoulder flexion 138 deg. Passive.  Cervical AROM: flexion 25 deg./ extension 60 deg./ L rotn. 64 deg./ R rotn. 64 deg./ L lat. Flex. 24 deg./ R lat. Flex. 32 deg.    UPPER EXTREMITY MMT:  Next tx session  SHOULDER SPECIAL TESTS: Limited R shoulder ROM limits testing  JOINT MOBILITY TESTING:  Limited  PALPATION:  (+) R posterior deltoid tenderness with palpation.    TODAY'S TREATMENT:  DATE: 07/13/22   SUBJECTIVE:  Pt. Reports no R shoulder pain at rest and >4/10 when reaching forward or overhead.    There.ex.:  B UBE 2 min. F/b  Seated wand B shoulder press ups/ chest press/ standing shoulder extension/ IR.  Cuing to decrease R UT overcompensation.    Supine L shoulder AROM (flexion/ serratus punches/ IR/ ER) 10x.  Light OP as tolerated.    Seated R shoulder flexion 3/5 MMT, abduction 3/5 MMT, extension 4/5 MMT.  Bicep 4+/5 MMT, tricep 4/5 MMT.    Supine 2# chest press/ tricep extension/ bicep curls/ shoulder flexion 20x.    Pulley ex. (Reviewed for HEP)   Manual tx.:   Seated STM to R UT/ posterior deltoid/ periscapular musculature.   Supine cervical stretches (all planes)- L/R UT and levator gentle stretches 3x each.  R shoulder AA/PROM in supine (static holds as tolerated)- pain tolerable range.      PATIENT EDUCATION: Education details: Discussed posture/ lifting limitations Person educated: Patient Education method: Explanation Education comprehension: verbalized understanding  HOME EXERCISE PROGRAM: Pulley ex. (Flexion/ abduction)- pain-free range   ASSESSMENT:  CLINICAL IMPRESSION: Pt. presents with R shoulder ROM/ strength limitations and R neck/ shoulder pain.  Pt. Has no R shoulder pain at rest but marked increase in pain with active shoulder flexion/ reaching >90 deg.  Pt able to complete passive pulley ex. With no pain.  Good cervical AROM/ stretches today.  Pt. Will continue with current HEP and use of pulley ex in a pain tolerable range.   Pt. Will benefit from skilled PT services to develop HEP to improve pain-free mobility.    OBJECTIVE IMPAIRMENTS: decreased activity tolerance, decreased mobility, decreased ROM, decreased strength, hypomobility, impaired flexibility, improper body mechanics, postural dysfunction, and pain.   ACTIVITY LIMITATIONS: carrying, lifting, standing, reach over head, and  hygiene/grooming  PARTICIPATION LIMITATIONS: interpersonal relationship and community activity  PERSONAL FACTORS: Age and Past/current experiences are also affecting patient's functional outcome.   REHAB POTENTIAL: Good  CLINICAL DECISION MAKING: Evolving/moderate complexity  EVALUATION COMPLEXITY: Moderate   GOALS: Goals reviewed with patient? Yes  SHORT TERM GOALS: Target date: 07/20/22  Pt. Independent with HEP to increase cervical AROM to [facility] to improve painfree mobility.  Baseline: See above Goal status: INITIAL   LONG TERM GOALS: Target date: 08/03/22  Pt. Will increase FOTO to 52 to improve pain-free mobility.  Baseline: initial FOTO 13 Goal status: INITIAL  2.  Pt. Will report <4/10 R shoulder pain with forward reaching and carrying <20# objects to improve household tasks.  Baseline:  7/10 pain with R shoulder use Goal status: INITIAL  3.  Pt. Will be able to complete 30 minutes of exercise at gym with no increase c/o neck/ R shoulder pain.   Baseline:  pain limited Goal status: INITIAL   PLAN:  PT FREQUENCY: 2x/week  PT DURATION: 4 weeks  PLANNED INTERVENTIONS: Therapeutic exercises, Therapeutic activity, Neuromuscular re-education, Patient/Family education, Self Care, Joint mobilization, Dry Needling, Electrical stimulation, Cryotherapy, Moist heat, Manual therapy, and Re-evaluation  PLAN FOR NEXT SESSION: Issue resisted UE ex. program  Pura Spice, PT, DPT # [id number] 07/13/2022, 3:57 PM

## 2022-07-14 DIAGNOSIS — M9903 Segmental and somatic dysfunction of lumbar region: Secondary | ICD-10-CM | POA: Diagnosis not present

## 2022-07-14 DIAGNOSIS — M9905 Segmental and somatic dysfunction of pelvic region: Secondary | ICD-10-CM | POA: Diagnosis not present

## 2022-07-14 DIAGNOSIS — M5416 Radiculopathy, lumbar region: Secondary | ICD-10-CM | POA: Diagnosis not present

## 2022-07-14 DIAGNOSIS — M5417 Radiculopathy, lumbosacral region: Secondary | ICD-10-CM | POA: Diagnosis not present

## 2022-07-15 ENCOUNTER — Encounter: Payer: Self-pay | Admitting: Physical Therapy

## 2022-07-15 ENCOUNTER — Ambulatory Visit: Payer: PPO | Admitting: Physical Therapy

## 2022-07-15 DIAGNOSIS — M436 Torticollis: Secondary | ICD-10-CM

## 2022-07-15 DIAGNOSIS — M6281 Muscle weakness (generalized): Secondary | ICD-10-CM

## 2022-07-15 DIAGNOSIS — M25611 Stiffness of right shoulder, not elsewhere classified: Secondary | ICD-10-CM

## 2022-07-15 DIAGNOSIS — G8929 Other chronic pain: Secondary | ICD-10-CM

## 2022-07-15 DIAGNOSIS — M25511 Pain in right shoulder: Secondary | ICD-10-CM | POA: Diagnosis not present

## 2022-07-15 DIAGNOSIS — M542 Cervicalgia: Secondary | ICD-10-CM

## 2022-07-15 NOTE — Therapy (Signed)
OUTPATIENT PHYSICAL THERAPY SHOULDER TREATMENT  Patient Name: Isaac Weber MRN: [id number] DOB:22-Jan-1946, 76 y.o., male Today's Date: 07/15/2022  END OF SESSION:  PT End of Session - 07/15/22 1424     Visit Number 4    Number of Visits 8    Date for PT Re-Evaluation 08/03/22    PT Start Time 1914            7829 to 1516  (51 minutes).    Past Medical History:  Diagnosis Date   Abnormal chest CT    Actinic keratosis    Allergic rhinitis, unspecified    Anemia    Aortic atherosclerosis (HCC)    Arthritis    BPH (benign prostatic hyperplasia)    COPD (chronic obstructive pulmonary disease) (HCC)    Coronary artery disease    Cough variant asthma    Diabetes mellitus without complication (HCC)    Glaucoma    Hypertension    Other chronic pain    Pancreatic lesion    Pneumonia    Pure hypercholesterolemia, unspecified    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 WD SCC, ulcerated. [facility] 04/29/2020   Past Surgical History:  Procedure Laterality Date   APPLICATION OF INTRAOPERATIVE CT SCAN N/A 05/12/2022   Procedure: APPLICATION OF INTRAOPERATIVE CT SCAN;  Surgeon: Meade Maw, MD;  Location: [facility] ORS;  Service: Neurosurgery;  Laterality: N/A;   BACK SURGERY     BRONCHIAL WASHINGS  11/26/2021   Procedure: BRONCHIAL WASHINGS;  Surgeon: Brand Males, MD;  Location: [facility]OS[facility];  Service: Endoscopy;;   BUNIONECTOMY Left 2003   hammer toe as well   BUNIONECTOMY WITH HAMMERTOE RECONSTRUCTION Left 2013   repeat   CERVICAL DISC SURGERY     COLONOSCOPY WITH PROPOFOL N/A 04/30/2021   Procedure: COLONOSCOPY WITH PROPOFOL;  Surgeon: Jonathon Bellows, MD;  Location: [facility] ENDOSCOPY;   Service: Gastroenterology;  Laterality: N/A;   GREEN LIGHT LASER TURP (TRANSURETHRAL RESECTION OF PROSTATE  2010   laser, shrink prostate   KNEE ARTHROSCOPY Right 1991   LEG SURGERY Left    BENIGN BONE TUMOR   LUMBAR [place] SURGERY  2010   discectomy   LUMBAR DISC SURGERY  2011   LUMBAR LAMINECTOMY/DECOMPRESSION MICRODISCECTOMY Right 05/12/2022   Procedure: RIGHT L5-S1 DISCECTOMY;  Surgeon: Meade Maw, MD;  Location: [facility] ORS;  Service: Neurosurgery;  Laterality: Right;   PROSTATE SURGERY  2002   shrink prostate   ROTATOR CUFF REPAIR Left 1999   debride, remove bonespur   ROTATOR CUFF REPAIR Right 1996   TONSILLECTOMY     TRANSFORAMINAL LUMBAR INTERBODY FUSION W/ MIS 1 LEVEL Right 05/12/2022   Procedure: RIGHT L4-5 MINIMALLY INVASIVE (MIS) TRANSFORAMINAL LUMBAR INTERBODY FUSION (TLIF);  Surgeon: Meade Maw, MD;  Location: [facility] ORS;  Service: Neurosurgery;  Laterality: Right;   VIDEO BRONCHOSCOPY N/A 11/26/2021   Procedure: VIDEO BRONCHOSCOPY WITHOUT FLUORO;  Surgeon: Brand Males, MD;  Location: [facility] ENDOSCOPY;  Service: Endoscopy;  Laterality: N/A;  for chroni ccough BAL   Patient Active Problem List   Diagnosis Date Noted   Spondylolisthesis at L4-L5 level 05/12/2022   Chronic right-sided low back pain with right-sided sciatica 05/12/2022   Spondylolisthesis of lumbar region 05/12/2022  Other chronic pain    Pre-operative respiratory examination 04/26/2022   Cervical facet joint syndrome 03/09/2022   Osteoarthritis of glenohumeral joint, right 03/02/2022   Cervical spondylosis with radiculopathy 03/02/2022   Lower extremity edema 01/18/2022   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 11/17/2021   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) 11/17/2021   Lumbar radiculopathy 10/27/2021   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   History of lumbar surgery 10/27/2021   Chronic pain syndrome 10/27/2021   Postnasal drip 10/06/2021    Derangement of right SI joint 09/28/2021   Spondylosis of lumbosacral region without myelopathy or radiculopathy 09/28/2021   Biceps tendinitis, right 08/26/2021   Supraspinatus syndrome, right 08/26/2021   Deviated septum 07/28/2021   History of chronic sinusitis 07/28/2021   Chronic pansinusitis 05/21/2021   Rhinitis 02/04/2021   Abnormal findings on diagnostic imaging of lung 07/31/2020   Chronic cough 07/31/2020   Centrilobular emphysema ([place]) 07/31/2020   Healthcare maintenance 07/31/2020   Type 2 diabetes mellitus with other specified complication ([place]) 84/13/2440   Kidney cysts 01/11/2020   Pancreatic cyst 01/11/2020   Iron deficiency anemia 01/11/2020   Hyperlipidemia associated with type 2 diabetes mellitus ([place]) 01/11/2020   Primary osteoarthritis involving multiple joints 01/11/2020   BPH associated with nocturia 01/11/2020   Cough variant asthma 08/17/2019   Essential hypertension     PCP: Olin Hauser, DO  REFERRING PROVIDER: Meade Maw, MD  REFERRING DIAG: Chronic R shoulder pain/ Cervical radiculitis  THERAPY DIAG:  Chronic right shoulder pain  Shoulder joint stiffness, right  Muscle weakness (generalized)  Cervicalgia  Neck stiffness  Rationale for Evaluation and Treatment: Rehabilitation  ONSET DATE: Chronic  SUBJECTIVE:                                                                                                                                                                                      SUBJECTIVE STATEMENT:  EVALUATION Pt. Reports 7/10 R shoulder pain at worst with reaching and 0/10 at best/ rest.  Pt. Has a 25# lifting restriction secondary to low back surgery (see MD notes).  Pt. Sees Tim Beshal [facility]) for back/ hip adjustments.   PERTINENT HISTORY: See MD notes.  Recert lumbar surgery/ previous cervical surgery.    PAIN:  Are you having pain? Yes: NPRS scale: 7/10 Pain location: R shoulder Pain description:  sharp Aggravating factors: reaching Relieving factors: rest  PRECAUTIONS: Back  WEIGHT BEARING RESTRICTIONS: No  FALLS:  Has patient fallen in last 6 months? No  LIVING ENVIRONMENT: Lives with: lives with their spouse Lives in: House/apartment  OCCUPATION: Retired.  Pt. Known to PT clinic/ see previous notes  PLOF: Independent  PATIENT GOALS: Decrease neck/ R shoulder pain with daily tasks.   NEXT MD VISIT: 08/03/22 with Dr. Izora Ribas  OBJECTIVE:   DIAGNOSTIC FINDINGS:  Narrative & Impression  CLINICAL DATA:  Neck pain, chronic, degenerative changes on xray. Prior C-spine fusion, now having severe right neck and shoulder pain, concern for C4/5 radiculopathy.   EXAM: MRI CERVICAL SPINE WITHOUT AND WITH CONTRAST   TECHNIQUE: Multiplanar and multiecho pulse sequences of the cervical spine, to include the craniocervical junction and cervicothoracic junction, were obtained without and with intravenous contrast.   CONTRAST:  27m GADAVIST GADOBUTROL 1 MMOL/ML IV SOLN   COMPARISON:  Cervical spine radiographs 03/02/2022   FINDINGS: Alignment: Minimal anterolisthesis of C3 on C4, C4 on C5, and C5 on C6.   Vertebrae: No fracture, suspicious marrow lesion, or significant marrow edema. Solid interbody osseous fusion at C5-6 and C6-7.   Cord: Normal cord signal and morphology.  No abnormal enhancement.   Posterior Fossa, vertebral arteries, paraspinal tissues: Unremarkable.   Disc levels:   C2-3: Minimal disc bulging, uncovertebral spurring, and moderate to severe right and mild left facet arthrosis without significant stenosis.   C3-4: Mild disc space narrowing. Anterolisthesis with bulging uncovered disc, uncovertebral spurring, and severe facet arthrosis result in mild spinal stenosis and moderate bilateral neural foraminal stenosis.   C4-5: Mild disc space narrowing. Anterolisthesis with bulging uncovered disc, asymmetric right uncovertebral spurring, and  severe right and moderate left facet arthrosis result in mild spinal stenosis and moderate right neural foraminal stenosis.   C5-6: Anterior fusion.  No stenosis.   C6-7: Anterior fusion.  No stenosis.   C7-T1: Moderate disc space narrowing. Disc bulging, uncovertebral spurring, and mild-to-moderate facet arthrosis result in mild-to-moderate right neural foraminal stenosis without spinal stenosis.   IMPRESSION: 1. Solid anterior fusion at C5-6 and C6-7 without residual stenosis. 2. Severe upper cervical facet arthrosis with mild spinal stenosis and moderate neural foraminal stenosis at C3-4 and C4-5. 3. Mild-to-moderate right neural foraminal stenosis at C7-T1.      PATIENT SURVEYS:  FOTO initial 13/ goal 555 COGNITION: Overall cognitive status: Within functional limits for tasks assessed     SENSATION: WFL  POSTURE: Rounded shoulder/ forward head posture.  UPPER EXTREMITY ROM:   Active ROM Right eval Left eval  Shoulder flexion 72 deg. 152 deg.  Shoulder extension    Shoulder abduction 68 deg. 146 deg.  Shoulder adduction    Shoulder internal rotation 50 deg. 70 deg.  Shoulder external rotation 50 deg. 74 deg.  Elbow flexion WNL WNL  Elbow extension WNL WNL  Wrist flexion WNL WNL  Wrist extension WNL WNL  Wrist ulnar deviation    Wrist radial deviation    Wrist pronation    Wrist supination    (Blank rows = not tested)  R shoulder flexion 138 deg. Passive.  Cervical AROM: flexion 25 deg./ extension 60 deg./ L rotn. 64 deg./ R rotn. 64 deg./ L lat. Flex. 24 deg./ R lat. Flex. 32 deg.    UPPER EXTREMITY MMT:  Next tx session  SHOULDER SPECIAL TESTS: Limited R shoulder ROM limits testing  JOINT MOBILITY TESTING:  Limited  PALPATION:  (+) R posterior deltoid tenderness with palpation.    Seated R shoulder flexion 3/5 MMT, abduction 3/5 MMT, extension 4/5 MMT.  Bicep 4+/5 MMT, tricep 4/5 MMT.  TODAY'S TREATMENT:  DATE: 07/15/22   SUBJECTIVE:  Pt. Reports no R shoulder pain at rest and >4/10 when reaching forward or overhead.    There.ex.:  B UBE 2 min. F/b.  Discussed upcoming weekend/ HEP.    Seated wand B shoulder press ups/ chest press/ standing shoulder extension/ IR.  Cuing to decrease R UT overcompensation.    Supine L shoulder AROM (flexion/ serratus punches/ IR/ ER) 10x.  Light OP as tolerated.   Supine 2# chest press/ IR/ ER 20x each.    Standing 4# tricep extension/ bicep curls 20x.    Pulley ex. (Reviewed for HEP)   Manual tx.:   Seated STM to R UT/ posterior deltoid/ periscapular musculature.   Supine cervical stretches (all planes)- L/R UT and levator gentle stretches 3x each.  R shoulder AA/PROM in supine (static holds as tolerated)- pain tolerable range.      PATIENT EDUCATION: Education details: Discussed posture/ lifting limitations Person educated: Patient Education method: Explanation Education comprehension: verbalized understanding  HOME EXERCISE PROGRAM: Pulley ex. (Flexion/ abduction)- pain-free range   ASSESSMENT:  CLINICAL IMPRESSION: Pt. presents with R shoulder ROM/ strength limitations and R neck/ shoulder pain.  Pt. Has no R shoulder pain at rest but marked increase in pain with active shoulder flexion/ reaching >90 deg.  Pt able to complete passive pulley ex. With no pain.  Good cervical AROM/ stretches today.  Pt. Will continue with current HEP and use of pulley ex in a pain tolerable range.   Pt. Will benefit from skilled PT services to develop HEP to improve pain-free mobility.    OBJECTIVE IMPAIRMENTS: decreased activity tolerance, decreased mobility, decreased ROM, decreased strength, hypomobility, impaired flexibility, improper body mechanics, postural dysfunction, and pain.   ACTIVITY LIMITATIONS: carrying, lifting, standing, reach over head, and  hygiene/grooming  PARTICIPATION LIMITATIONS: interpersonal relationship and community activity  PERSONAL FACTORS: Age and Past/current experiences are also affecting patient's functional outcome.   REHAB POTENTIAL: Good  CLINICAL DECISION MAKING: Evolving/moderate complexity  EVALUATION COMPLEXITY: Moderate   GOALS: Goals reviewed with patient? Yes  SHORT TERM GOALS: Target date: 07/20/22  Pt. Independent with HEP to increase cervical AROM to [facility] to improve painfree mobility.  Baseline: See above Goal status: INITIAL   LONG TERM GOALS: Target date: 08/03/22  Pt. Will increase FOTO to 52 to improve pain-free mobility.  Baseline: initial FOTO 13 Goal status: INITIAL  2.  Pt. Will report <4/10 R shoulder pain with forward reaching and carrying <20# objects to improve household tasks.  Baseline:  7/10 pain with R shoulder use Goal status: INITIAL  3.  Pt. Will be able to complete 30 minutes of exercise at gym with no increase c/o neck/ R shoulder pain.   Baseline:  pain limited Goal status: INITIAL   PLAN:  PT FREQUENCY: 2x/week  PT DURATION: 4 weeks  PLANNED INTERVENTIONS: Therapeutic exercises, Therapeutic activity, Neuromuscular re-education, Patient/Family education, Self Care, Joint mobilization, Dry Needling, Electrical stimulation, Cryotherapy, Moist heat, Manual therapy, and Re-evaluation  PLAN FOR NEXT SESSION: Check STGs  Pura Spice, PT, DPT # [id number] 07/15/2022, 2:25 PM

## 2022-07-20 ENCOUNTER — Ambulatory Visit: Payer: PPO | Admitting: Physical Therapy

## 2022-07-20 DIAGNOSIS — M25611 Stiffness of right shoulder, not elsewhere classified: Secondary | ICD-10-CM

## 2022-07-20 DIAGNOSIS — G8929 Other chronic pain: Secondary | ICD-10-CM

## 2022-07-20 DIAGNOSIS — M25511 Pain in right shoulder: Secondary | ICD-10-CM | POA: Diagnosis not present

## 2022-07-20 DIAGNOSIS — M542 Cervicalgia: Secondary | ICD-10-CM

## 2022-07-20 DIAGNOSIS — M6281 Muscle weakness (generalized): Secondary | ICD-10-CM

## 2022-07-20 DIAGNOSIS — M436 Torticollis: Secondary | ICD-10-CM

## 2022-07-20 NOTE — Therapy (Signed)
OUTPATIENT PHYSICAL THERAPY SHOULDER TREATMENT  Patient Name: Isaac Weber MRN: [id number] DOB:[birth year], 76 y.o., male Today's Date: 07/20/2022  END OF SESSION:  PT End of Session - 07/20/22 1424     Visit Number 5    Number of Visits 8    Date for PT Re-Evaluation 08/03/22    PT Start Time 83    PT Stop Time 4098    PT Time Calculation (min) 52 min             Past Medical History:  Diagnosis Date   Abnormal chest CT    Actinic keratosis    Allergic rhinitis, unspecified    Anemia    Aortic atherosclerosis ([place])    Arthritis    BPH (benign prostatic hyperplasia)    COPD (chronic obstructive pulmonary disease) ([place])    Coronary artery disease    Cough variant asthma    Diabetes mellitus without complication (HCC)    Glaucoma    Hypertension    Other chronic pain    Pancreatic lesion    Pneumonia    Pure hypercholesterolemia, unspecified    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 WD SCC, ulcerated. [facility] 04/29/2020   Past Surgical History:  Procedure Laterality Date   APPLICATION OF INTRAOPERATIVE CT SCAN N/A 05/12/2022   Procedure: APPLICATION OF INTRAOPERATIVE CT SCAN;  Surgeon: Meade Maw, MD;  Location: [facility] ORS;  Service: Neurosurgery;  Laterality: N/A;   BACK SURGERY     BRONCHIAL WASHINGS  11/26/2021   Procedure: BRONCHIAL WASHINGS;  Surgeon: Brand Males, MD;  Location: [facility] ENDOSCOPY;  Service: Endoscopy;;   BUNIONECTOMY Left 2003   hammer toe as well   BUNIONECTOMY WITH HAMMERTOE RECONSTRUCTION Left 2013   repeat   CERVICAL DISC SURGERY     COLONOSCOPY WITH PROPOFOL N/A 04/30/2021   Procedure: COLONOSCOPY WITH PROPOFOL;  Surgeon: Jonathon Bellows, MD;   Location[facility];  Service: Gastroenterology;  Laterality: N/A;   GREEN LIGHT LASER TURP (TRANSURETHRAL RESECTION OF PROSTATE  2010   laser, shrink prostate   KNEE ARTHROSCOPY Right 1991   LEG SURGERY Left    BENIGN BONE TUMOR   LUMBAR [place] SURGERY  2010   discectomy   LUMBAR DISC SURGERY  2011   LUMBAR LAMINECTOMY/DECOMPRESSION MICRODISCECTOMY Right 05/12/2022   Procedure: RIGHT L5-S1 DISCECTOMY;  Surgeon: Meade Maw, MD;  Location: [facility] ORS;  Service: Neurosurgery;  Laterality: Right;   PROSTATE SURGERY  2002   shrink prostate   ROTATOR CUFF REPAIR Left 1999   debride, remove bonespur   ROTATOR CUFF REPAIR Right 1996   TONSILLECTOMY     TRANSFORAMINAL LUMBAR INTERBODY FUSION W/ MIS 1 LEVEL Right 05/12/2022   Procedure: RIGHT L4-5 MINIMALLY INVASIVE (MIS) TRANSFORAMINAL LUMBAR INTERBODY FUSION (TLIF);  Surgeon: Meade Maw, MD;  Location: [facility] ORS;  Service: Neurosurgery;  Laterality: Right;   VIDEO BRONCHOSCOPY N/A 11/26/2021   Procedure: VIDEO BRONCHOSCOPY WITHOUT FLUORO;  Surgeon: Brand Males, MD;  Location: [facility] ENDOSCOPY;  Service: Endoscopy;  Laterality: N/A;  for chroni ccough BAL   Patient Active Problem List   Diagnosis Date Noted   Spondylolisthesis at L4-L5 level 05/12/2022   Chronic right-sided low back pain with right-sided sciatica  05/12/2022   Spondylolisthesis of lumbar region 05/12/2022   Other chronic pain    Pre-operative respiratory examination 04/26/2022   Cervical facet joint syndrome 03/09/2022   Osteoarthritis of glenohumeral joint, right 03/02/2022   Cervical spondylosis with radiculopathy 03/02/2022   Lower extremity edema 01/18/2022   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 11/17/2021   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) 11/17/2021   Lumbar radiculopathy 10/27/2021   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   History of lumbar surgery 10/27/2021   Chronic pain syndrome 10/27/2021   Postnasal  drip 10/06/2021   Derangement of right SI joint 09/28/2021   Spondylosis of lumbosacral region without myelopathy or radiculopathy 09/28/2021   Biceps tendinitis, right 08/26/2021   Supraspinatus syndrome, right 08/26/2021   Deviated septum 07/28/2021   History of chronic sinusitis 07/28/2021   Chronic pansinusitis 05/21/2021   Rhinitis 02/04/2021   Abnormal findings on diagnostic imaging of lung 07/31/2020   Chronic cough 07/31/2020   Centrilobular emphysema ([place]) 07/31/2020   Healthcare maintenance 07/31/2020   Type 2 diabetes mellitus with other specified complication ([place]) 54/49/2010   Kidney cysts 01/11/2020   Pancreatic cyst 01/11/2020   Iron deficiency anemia 01/11/2020   Hyperlipidemia associated with type 2 diabetes mellitus ([place]) 01/11/2020   Primary osteoarthritis involving multiple joints 01/11/2020   BPH associated with nocturia 01/11/2020   Cough variant asthma 08/17/2019   Essential hypertension     PCP: Olin Hauser, DO  REFERRING PROVIDER: Meade Maw, MD  REFERRING DIAG: Chronic R shoulder pain/ Cervical radiculitis  THERAPY DIAG:  Chronic right shoulder pain  Shoulder joint stiffness, right  Muscle weakness (generalized)  Cervicalgia  Neck stiffness  Rationale for Evaluation and Treatment: Rehabilitation  ONSET DATE: Chronic  SUBJECTIVE:                                                                                                                                                                                      SUBJECTIVE STATEMENT:  EVALUATION Pt. Reports 7/10 R shoulder pain at worst with reaching and 0/10 at best/ rest.  Pt. Has a 25# lifting restriction secondary to low back surgery (see MD notes).  Pt. Sees [facility]) for back/ hip adjustments.   PERTINENT HISTORY: See MD notes.  Recert lumbar surgery/ previous cervical surgery.    PAIN:  Are you having pain? Yes: NPRS scale: 7/10 Pain location: R  shoulder Pain description: sharp Aggravating factors: reaching Relieving factors: rest  PRECAUTIONS: Back  WEIGHT BEARING RESTRICTIONS: No  FALLS:  Has patient fallen in last 6 months? No  LIVING ENVIRONMENT: Lives with: lives with their spouse Lives in: House/apartment  OCCUPATION:  Retired.  Pt. Known to PT clinic/ see previous notes  PLOF: Independent  PATIENT GOALS: Decrease neck/ R shoulder pain with daily tasks.   NEXT MD VISIT: 08/03/22 with Dr. Izora Ribas  OBJECTIVE:   DIAGNOSTIC FINDINGS:  Narrative & Impression  CLINICAL DATA:  Neck pain, chronic, degenerative changes on xray. Prior C-spine fusion, now having severe right neck and shoulder pain, concern for C4/5 radiculopathy.   EXAM: MRI CERVICAL SPINE WITHOUT AND WITH CONTRAST   TECHNIQUE: Multiplanar and multiecho pulse sequences of the cervical spine, to include the craniocervical junction and cervicothoracic junction, were obtained without and with intravenous contrast.   CONTRAST:  21m GADAVIST GADOBUTROL 1 MMOL/ML IV SOLN   COMPARISON:  Cervical spine radiographs 03/02/2022   FINDINGS: Alignment: Minimal anterolisthesis of C3 on C4, C4 on C5, and C5 on C6.   Vertebrae: No fracture, suspicious marrow lesion, or significant marrow edema. Solid interbody osseous fusion at C5-6 and C6-7.   Cord: Normal cord signal and morphology.  No abnormal enhancement.   Posterior Fossa, vertebral arteries, paraspinal tissues: Unremarkable.   Disc levels:   C2-3: Minimal disc bulging, uncovertebral spurring, and moderate to severe right and mild left facet arthrosis without significant stenosis.   C3-4: Mild disc space narrowing. Anterolisthesis with bulging uncovered disc, uncovertebral spurring, and severe facet arthrosis result in mild spinal stenosis and moderate bilateral neural foraminal stenosis.   C4-5: Mild disc space narrowing. Anterolisthesis with bulging uncovered disc, asymmetric right  uncovertebral spurring, and severe right and moderate left facet arthrosis result in mild spinal stenosis and moderate right neural foraminal stenosis.   C5-6: Anterior fusion.  No stenosis.   C6-7: Anterior fusion.  No stenosis.   C7-T1: Moderate disc space narrowing. Disc bulging, uncovertebral spurring, and mild-to-moderate facet arthrosis result in mild-to-moderate right neural foraminal stenosis without spinal stenosis.   IMPRESSION: 1. Solid anterior fusion at C5-6 and C6-7 without residual stenosis. 2. Severe upper cervical facet arthrosis with mild spinal stenosis and moderate neural foraminal stenosis at C3-4 and C4-5. 3. Mild-to-moderate right neural foraminal stenosis at C7-T1.      PATIENT SURVEYS:  FOTO initial 13/ goal 579 COGNITION: Overall cognitive status: Within functional limits for tasks assessed     SENSATION: WFL  POSTURE: Rounded shoulder/ forward head posture.  UPPER EXTREMITY ROM:   Active ROM Right eval Left eval  Shoulder flexion 72 deg. 152 deg.  Shoulder extension    Shoulder abduction 68 deg. 146 deg.  Shoulder adduction    Shoulder internal rotation 50 deg. 70 deg.  Shoulder external rotation 50 deg. 74 deg.  Elbow flexion WNL WNL  Elbow extension WNL WNL  Wrist flexion WNL WNL  Wrist extension WNL WNL  Wrist ulnar deviation    Wrist radial deviation    Wrist pronation    Wrist supination    (Blank rows = not tested)  R shoulder flexion 138 deg. Passive.  Cervical AROM: flexion 25 deg./ extension 60 deg./ L rotn. 64 deg./ R rotn. 64 deg./ L lat. Flex. 24 deg./ R lat. Flex. 32 deg.    UPPER EXTREMITY MMT:  Next tx session  SHOULDER SPECIAL TESTS: Limited R shoulder ROM limits testing  JOINT MOBILITY TESTING:  Limited  PALPATION:  (+) R posterior deltoid tenderness with palpation.    Seated R shoulder flexion 3/5 MMT, abduction 3/5 MMT, extension 4/5 MMT.  Bicep 4+/5 MMT, tricep 4/5 MMT.  TODAY'S TREATMENT:  DATE: 07/20/22   SUBJECTIVE:  Pt. Reports no R shoulder/neck pain at rest prior to tx. Session.  Pt. Reports no new complaints.  Pt. Returns to MD in 2 weeks.  Pt. Using 5# dumbbells at home for bicep curls.   There.ex.:  B UBE 3 min. F/b.  Discussed     Seated wand B shoulder press ups/ chest press/ standing shoulder extension/ IR.  Cuing to decrease R UT overcompensation with mirror feedback.     Standing 5# tricep extension/ bicep curls/ pronation and supination 40x each.    Seated shoulder pulley (flexion/ abduction)- 20x.    Supine R shoulder AROM (flexion/ serratus punches/ IR/ ER) 20x.  Light OP as tolerated.      Nautilus: seated lat. Pull down 20x.    Manual tx.:   Seated STM to R UT/ posterior deltoid/ periscapular musculature.   Supine cervical stretches (all planes)- L/R UT and levator gentle stretches 3x each.  Supine rotn to L/R with static holds in pain tolerable range.  R shoulder AA/PROM in supine (static holds as tolerated)- pain tolerable range.      PATIENT EDUCATION: Education details: Discussed posture/ lifting limitations Person educated: Patient Education method: Explanation Education comprehension: verbalized understanding  HOME EXERCISE PROGRAM: Pulley ex. (Flexion/ abduction)- pain-free range   ASSESSMENT:  CLINICAL IMPRESSION: Pt. presents with R shoulder ROM/ strength limitations.  No c/o neck pain t/o tx. Session.   Pt. Has no R shoulder pain at rest but marked increase in pain with active shoulder flexion/ reaching >90 deg.  Good progression with R shoulder resisted there.ex. in a modified range to manage pain symptoms.  Pt. Will continue with current HEP and use of pulley ex in a pain tolerable range.   Pt. Will benefit from skilled PT services to develop HEP to improve pain-free mobility.    OBJECTIVE IMPAIRMENTS:  decreased activity tolerance, decreased mobility, decreased ROM, decreased strength, hypomobility, impaired flexibility, improper body mechanics, postural dysfunction, and pain.   ACTIVITY LIMITATIONS: carrying, lifting, standing, reach over head, and hygiene/grooming  PARTICIPATION LIMITATIONS: interpersonal relationship and community activity  PERSONAL FACTORS: Age and Past/current experiences are also affecting patient's functional outcome.   REHAB POTENTIAL: Good  CLINICAL DECISION MAKING: Evolving/moderate complexity  EVALUATION COMPLEXITY: Moderate   GOALS: Goals reviewed with patient? Yes  SHORT TERM GOALS: Target date: 07/20/22  Pt. Independent with HEP to increase cervical AROM to [facility] to improve painfree mobility.  Baseline: See above Goal status:  Goal met   LONG TERM GOALS: Target date: 08/03/22  Pt. Will increase FOTO to 52 to improve pain-free mobility.  Baseline: initial FOTO 13 Goal status: INITIAL  2.  Pt. Will report <4/10 R shoulder pain with forward reaching and carrying <20# objects to improve household tasks.  Baseline:  7/10 pain with R shoulder use Goal status: INITIAL  3.  Pt. Will be able to complete 30 minutes of exercise at gym with no increase c/o neck/ R shoulder pain.   Baseline:  pain limited Goal status: INITIAL   PLAN:  PT FREQUENCY: 2x/week  PT DURATION: 4 weeks  PLANNED INTERVENTIONS: Therapeutic exercises, Therapeutic activity, Neuromuscular re-education, Patient/Family education, Self Care, Joint mobilization, Dry Needling, Electrical stimulation, Cryotherapy, Moist heat, Manual therapy, and Re-evaluation  PLAN FOR NEXT SESSION: Progress resisted HEP.    Pura Spice, PT, DPT # [phone] 07/20/2022, 3:16 PM

## 2022-07-22 ENCOUNTER — Ambulatory Visit: Payer: PPO | Admitting: Physical Therapy

## 2022-07-22 DIAGNOSIS — M25511 Pain in right shoulder: Secondary | ICD-10-CM | POA: Diagnosis not present

## 2022-07-22 DIAGNOSIS — M6281 Muscle weakness (generalized): Secondary | ICD-10-CM

## 2022-07-22 DIAGNOSIS — M436 Torticollis: Secondary | ICD-10-CM

## 2022-07-22 DIAGNOSIS — M542 Cervicalgia: Secondary | ICD-10-CM

## 2022-07-22 DIAGNOSIS — M25611 Stiffness of right shoulder, not elsewhere classified: Secondary | ICD-10-CM

## 2022-07-22 DIAGNOSIS — G8929 Other chronic pain: Secondary | ICD-10-CM

## 2022-07-22 NOTE — Therapy (Signed)
OUTPATIENT PHYSICAL THERAPY SHOULDER TREATMENT  Patient Name: Isaac Weber MRN: [id number] DOB:06/16/1946, 76 y.o., male Today's Date: 07/23/2022  END OF SESSION:  PT End of Session - 07/22/22 1428     Visit Number 6    Number of Visits 8    Date for PT Re-Evaluation 08/03/22    PT Start Time 2703    PT Stop Time 5009    PT Time Calculation (min) 49 min             Past Medical History:  Diagnosis Date   Abnormal chest CT    Actinic keratosis    Allergic rhinitis, unspecified    Anemia    Aortic atherosclerosis ([place])    Arthritis    BPH (benign prostatic hyperplasia)    COPD (chronic obstructive pulmonary disease) ([place])    Coronary artery disease    Cough variant asthma    Diabetes mellitus without complication (HCC)    Glaucoma    Hypertension    Other chronic pain    Pancreatic lesion    Pneumonia    Pure hypercholesterolemia, unspecified    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 WD SCC, ulcerated. [facility] 04/29/2020   Past Surgical History:  Procedure Laterality Date   APPLICATION OF INTRAOPERATIVE CT SCAN N/A 05/12/2022   Procedure: APPLICATION OF INTRAOPERATIVE CT SCAN;  Surgeon: Meade Maw, MD;  Location: [facility] ORS;  Service: Neurosurgery;  Laterality: N/A;   BACK SURGERY     BRONCHIAL WASHINGS  11/26/2021   Procedure: BRONCHIAL WASHINGS;  Surgeon: Brand Males, MD;  Location: [facility];  Service: Endoscopy;;   BUNIONECTOMY Left 2003   hammer toe as well   BUNIONECTOMY WITH HAMMERTOE RECONSTRUCTION Left 2013   repeat   CERVICAL DISC SURGERY     COLONOSCOPY WITH PROPOFOL N/A 04/30/2021   Procedure: COLONOSCOPY WITH PROPOFOL;  Surgeon: Jonathon Bellows, MD;   Location: [facility] END[facility]COPY;  Service: Gastroenterology;  Laterality: N/A;   GREEN LIGHT LASER TURP (TRANSURETHRAL RESECTION OF PROSTATE  2010   laser, shrink prostate   KNEE ARTHROSCOPY Right 1991   LEG SURGERY Left    BENIGN BONE TUMOR   LUMBAR [place] SURGERY  2010   discectomy   LUMBAR DISC SURGERY  2011   LUMBAR LAMINECTOMY/DECOMPRESSION MICRODISCECTOMY Right 05/12/2022   Procedure: RIGHT L5-S1 DISCECTOMY;  Surgeon: Meade Maw, MD;  Location: [facility] ORS;  Service: Neurosurgery;  Laterality: Right;   PROSTATE SURGERY  2002   shrink prostate   ROTATOR CUFF REPAIR Left 1999   debride, remove bonespur   ROTATOR CUFF REPAIR Right 1996   TONSILLECTOMY     TRANSFORAMINAL LUMBAR INTERBODY FUSION W/ MIS 1 LEVEL Right 05/12/2022   Procedure: RIGHT L4-5 MINIMALLY INVASIVE (MIS) TRANSFORAMINAL LUMBAR INTERBODY FUSION (TLIF);  Surgeon: Meade Maw, MD;  Location: [facility] ORS;  Service: Neurosurgery;  Laterality: Right;   VIDEO BRONCHOSCOPY N/A 11/26/2021   Procedure: VIDEO BRONCHOSCOPY WITHOUT FLUORO;  Surgeon: Brand Males, MD;  Location: [facility];  Service: Endoscopy;  Laterality: N/A;  for chroni ccough BAL   Patient Active Problem List   Diagnosis Date Noted   Spondylolisthesis at L4-L5 level 05/12/2022   Chronic right-sided low back pain with right-sided sciatica  05/12/2022   Spondylolisthesis of lumbar region 05/12/2022   Other chronic pain    Pre-operative respiratory examination 04/26/2022   Cervical facet joint syndrome 03/09/2022   Osteoarthritis of glenohumeral joint, right 03/02/2022   Cervical spondylosis with radiculopathy 03/02/2022   Lower extremity edema 01/18/2022   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 11/17/2021   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) 11/17/2021   Lumbar radiculopathy 10/27/2021   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   History of lumbar surgery 10/27/2021   Chronic pain syndrome 10/27/2021   Postnasal  drip 10/06/2021   Derangement of right SI joint 09/28/2021   Spondylosis of lumbosacral region without myelopathy or radiculopathy 09/28/2021   Biceps tendinitis, right 08/26/2021   Supraspinatus syndrome, right 08/26/2021   Deviated septum 07/28/2021   History of chronic sinusitis 07/28/2021   Chronic pansinusitis 05/21/2021   Rhinitis 02/04/2021   Abnormal findings on diagnostic imaging of lung 07/31/2020   Chronic cough 07/31/2020   Centrilobular emphysema ([place]) 07/31/2020   Healthcare maintenance 07/31/2020   Type 2 diabetes mellitus with other specified complication ([place]) 54/49/2010   Kidney cysts 01/11/2020   Pancreatic cyst 01/11/2020   Iron deficiency anemia 01/11/2020   Hyperlipidemia associated with type 2 diabetes mellitus ([place]) 01/11/2020   Primary osteoarthritis involving multiple joints 01/11/2020   BPH associated with nocturia 01/11/2020   Cough variant asthma 08/17/2019   Essential hypertension     PCP: Olin Hauser, DO  REFERRING PROVIDER: Meade Maw, MD  REFERRING DIAG: Chronic R shoulder pain/ Cervical radiculitis  THERAPY DIAG:  Chronic right shoulder pain  Shoulder joint stiffness, right  Muscle weakness (generalized)  Cervicalgia  Neck stiffness  Rationale for Evaluation and Treatment: Rehabilitation  ONSET DATE: Chronic  SUBJECTIVE:                                                                                                                                                                                      SUBJECTIVE STATEMENT:  EVALUATION Pt. Reports 7/10 R shoulder pain at worst with reaching and 0/10 at best/ rest.  Pt. Has a 25# lifting restriction secondary to low back surgery (see MD notes).  Pt. Sees [facility]) for back/ hip adjustments.   PERTINENT HISTORY: See MD notes.  Recert lumbar surgery/ previous cervical surgery.    PAIN:  Are you having pain? Yes: NPRS scale: 7/10 Pain location: R  shoulder Pain description: sharp Aggravating factors: reaching Relieving factors: rest  PRECAUTIONS: Back  WEIGHT BEARING RESTRICTIONS: No  FALLS:  Has patient fallen in last 6 months? No  LIVING ENVIRONMENT: Lives with: lives with their spouse Lives in: House/apartment  OCCUPATION:  Retired.  Pt. Known to PT clinic/ see previous notes  PLOF: Independent  PATIENT GOALS: Decrease neck/ R shoulder pain with daily tasks.   NEXT MD VISIT: 08/03/22 with Dr. Izora Ribas  OBJECTIVE:   DIAGNOSTIC FINDINGS:  Narrative & Impression  CLINICAL DATA:  Neck pain, chronic, degenerative changes on xray. Prior C-spine fusion, now having severe right neck and shoulder pain, concern for C4/5 radiculopathy.   EXAM: MRI CERVICAL SPINE WITHOUT AND WITH CONTRAST   TECHNIQUE: Multiplanar and multiecho pulse sequences of the cervical spine, to include the craniocervical junction and cervicothoracic junction, were obtained without and with intravenous contrast.   CONTRAST:  21m GADAVIST GADOBUTROL 1 MMOL/ML IV SOLN   COMPARISON:  Cervical spine radiographs 03/02/2022   FINDINGS: Alignment: Minimal anterolisthesis of C3 on C4, C4 on C5, and C5 on C6.   Vertebrae: No fracture, suspicious marrow lesion, or significant marrow edema. Solid interbody osseous fusion at C5-6 and C6-7.   Cord: Normal cord signal and morphology.  No abnormal enhancement.   Posterior Fossa, vertebral arteries, paraspinal tissues: Unremarkable.   Disc levels:   C2-3: Minimal disc bulging, uncovertebral spurring, and moderate to severe right and mild left facet arthrosis without significant stenosis.   C3-4: Mild disc space narrowing. Anterolisthesis with bulging uncovered disc, uncovertebral spurring, and severe facet arthrosis result in mild spinal stenosis and moderate bilateral neural foraminal stenosis.   C4-5: Mild disc space narrowing. Anterolisthesis with bulging uncovered disc, asymmetric right  uncovertebral spurring, and severe right and moderate left facet arthrosis result in mild spinal stenosis and moderate right neural foraminal stenosis.   C5-6: Anterior fusion.  No stenosis.   C6-7: Anterior fusion.  No stenosis.   C7-T1: Moderate disc space narrowing. Disc bulging, uncovertebral spurring, and mild-to-moderate facet arthrosis result in mild-to-moderate right neural foraminal stenosis without spinal stenosis.   IMPRESSION: 1. Solid anterior fusion at C5-6 and C6-7 without residual stenosis. 2. Severe upper cervical facet arthrosis with mild spinal stenosis and moderate neural foraminal stenosis at C3-4 and C4-5. 3. Mild-to-moderate right neural foraminal stenosis at C7-T1.      PATIENT SURVEYS:  FOTO initial 13/ goal 579 COGNITION: Overall cognitive status: Within functional limits for tasks assessed     SENSATION: WFL  POSTURE: Rounded shoulder/ forward head posture.  UPPER EXTREMITY ROM:   Active ROM Right eval Left eval  Shoulder flexion 72 deg. 152 deg.  Shoulder extension    Shoulder abduction 68 deg. 146 deg.  Shoulder adduction    Shoulder internal rotation 50 deg. 70 deg.  Shoulder external rotation 50 deg. 74 deg.  Elbow flexion WNL WNL  Elbow extension WNL WNL  Wrist flexion WNL WNL  Wrist extension WNL WNL  Wrist ulnar deviation    Wrist radial deviation    Wrist pronation    Wrist supination    (Blank rows = not tested)  R shoulder flexion 138 deg. Passive.  Cervical AROM: flexion 25 deg./ extension 60 deg./ L rotn. 64 deg./ R rotn. 64 deg./ L lat. Flex. 24 deg./ R lat. Flex. 32 deg.    UPPER EXTREMITY MMT:  Next tx session  SHOULDER SPECIAL TESTS: Limited R shoulder ROM limits testing  JOINT MOBILITY TESTING:  Limited  PALPATION:  (+) R posterior deltoid tenderness with palpation.    Seated R shoulder flexion 3/5 MMT, abduction 3/5 MMT, extension 4/5 MMT.  Bicep 4+/5 MMT, tricep 4/5 MMT.  TODAY'S TREATMENT:  DATE: 07/22/22   SUBJECTIVE:  Pt. Reports no R shoulder/neck pain at rest prior to tx. Session.  Pt. Reports R shoulder is primary cause of issues (PT agrees).  Pt. Returns to MD in 2 weeks.  Pt. Using 5# dumbbells at home for bicep curls.   There.ex.:  B UBE 3 min. F/b.      Standing wand B shoulder press ups/ chest press/ standing shoulder extension/ IR.  Cuing to decrease R UT overcompensation with mirror feedback.     Standing 5# bicep curls 30x each.      Nautilus: seated 50# lat. Pull down 20x/ standing 30# tricep extension 30x/ standing 10# chest press (difficulty with full elbow extension)- 12x/ scapular retraction 50# 15x2.      Supine R shoulder AROM (flexion/ serratus punches/ IR/ ER) 20x.  Light OP as tolerated.    Manual tx.:   Seated STM to R UT/ posterior deltoid/ periscapular musculature.  Hypervolt to R UT/ cervical region.    Supine cervical stretches (all planes)- L/R UT and levator gentle stretches 3x each.  Supine rotn to L/R with static holds in pain tolerable range.  R shoulder AA/PROM in supine (static holds as tolerated)- pain tolerable range.   Pt. Using ice to hip at home and then R shoulder as needed.   PATIENT EDUCATION: Education details: Discussed posture/ lifting limitations Person educated: Patient Education method: Explanation Education comprehension: verbalized understanding  HOME EXERCISE PROGRAM: Pulley ex. (Flexion/ abduction)- pain-free range   ASSESSMENT:  CLINICAL IMPRESSION: Pt. presents with R shoulder ROM/ strength limitations.  No c/o neck pain t/o tx. Session.   Pt. Has no R shoulder pain at rest but marked increase in pain with active shoulder flexion/ reaching >90 deg.  Good progression with R shoulder resisted there.ex. in a modified range to manage pain symptoms.  Limited with chest press while using bar  due to R shoulder limitations/ weakness.   Pt. Will continue with current HEP and use of pulley ex in a pain tolerable range.   Pt. Will benefit from skilled PT services to develop HEP to improve pain-free mobility.    OBJECTIVE IMPAIRMENTS: decreased activity tolerance, decreased mobility, decreased ROM, decreased strength, hypomobility, impaired flexibility, improper body mechanics, postural dysfunction, and pain.   ACTIVITY LIMITATIONS: carrying, lifting, standing, reach over head, and hygiene/grooming  PARTICIPATION LIMITATIONS: interpersonal relationship and community activity  PERSONAL FACTORS: Age and Past/current experiences are also affecting patient's functional outcome.   REHAB POTENTIAL: Good  CLINICAL DECISION MAKING: Evolving/moderate complexity  EVALUATION COMPLEXITY: Moderate   GOALS: Goals reviewed with patient? Yes  SHORT TERM GOALS: Target date: 07/20/22  Pt. Independent with HEP to increase cervical AROM to [facility] to improve painfree mobility.  Baseline: See above Goal status:  Goal met   LONG TERM GOALS: Target date: 08/03/22  Pt. Will increase FOTO to 52 to improve pain-free mobility.  Baseline: initial FOTO 13 Goal status: INITIAL  2.  Pt. Will report <4/10 R shoulder pain with forward reaching and carrying <20# objects to improve household tasks.  Baseline:  7/10 pain with R shoulder use Goal status: INITIAL  3.  Pt. Will be able to complete 30 minutes of exercise at gym with no increase c/o neck/ R shoulder pain.   Baseline:  pain limited Goal status: INITIAL   PLAN:  PT FREQUENCY: 2x/week  PT DURATION: 4 weeks  PLANNED INTERVENTIONS: Therapeutic exercises, Therapeutic activity, Neuromuscular re-education, Patient/Family education, Self Care, Joint mobilization, Dry Needling, Electrical stimulation, Cryotherapy, Moist heat,  Manual therapy, and Re-evaluation  PLAN FOR NEXT SESSION: Progress resisted HEP.    Pura Spice, PT, DPT #  [phone] 07/23/2022, 10:32 AM

## 2022-07-27 ENCOUNTER — Encounter: Payer: Self-pay | Admitting: Physical Therapy

## 2022-07-27 ENCOUNTER — Ambulatory Visit: Payer: PPO | Attending: Neurosurgery | Admitting: Physical Therapy

## 2022-07-27 DIAGNOSIS — M436 Torticollis: Secondary | ICD-10-CM | POA: Diagnosis not present

## 2022-07-27 DIAGNOSIS — M6281 Muscle weakness (generalized): Secondary | ICD-10-CM | POA: Insufficient documentation

## 2022-07-27 DIAGNOSIS — M25611 Stiffness of right shoulder, not elsewhere classified: Secondary | ICD-10-CM | POA: Insufficient documentation

## 2022-07-27 DIAGNOSIS — G8929 Other chronic pain: Secondary | ICD-10-CM | POA: Insufficient documentation

## 2022-07-27 DIAGNOSIS — M249 Joint derangement, unspecified: Secondary | ICD-10-CM | POA: Diagnosis not present

## 2022-07-27 DIAGNOSIS — M542 Cervicalgia: Secondary | ICD-10-CM | POA: Diagnosis not present

## 2022-07-27 DIAGNOSIS — M25511 Pain in right shoulder: Secondary | ICD-10-CM | POA: Diagnosis not present

## 2022-07-27 NOTE — Therapy (Signed)
OUTPATIENT PHYSICAL THERAPY SHOULDER TREATMENT  Patient Name: Isaac Weber MRN: [id number] DOB:20-Apr-1946, 77 y.o., male Today's Date: 07/27/2022  END OF SESSION:  PT End of Session - 07/27/22 1421     Visit Number 7    Number of Visits 8    Date for PT Re-Evaluation 08/03/22    PT Start Time 1421    PT Stop Time 5366    PT Time Calculation (min) 54 min             Past Medical History:  Diagnosis Date   Abnormal chest CT    Actinic keratosis    Allergic rhinitis, unspecified    Anemia    Aortic atherosclerosis ([place])    Arthritis    BPH (benign prostatic hyperplasia)    COPD (chronic obstructive pulmonary disease) ([place])    Coronary artery disease    Cough variant asthma    Diabetes mellitus without complication (HCC)    Glaucoma    Hypertension    Other chronic pain    Pancreatic lesion    Pneumonia    Pure hypercholesterolemia, unspecified    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 WD SCC, ulcerated. [facility] 04/29/2020   Past Surgical History:  Procedure Laterality Date   APPLICATION OF INTRAOPERATIVE CT SCAN N/A 05/12/2022   Procedure: APPLICATION OF INTRAOPERATIVE CT SCAN;  Surgeon: Meade Maw, MD;  Location: [facility] ORS;  Service: Neurosurgery;  Laterality: N/A;   BACK SURGERY     BRONCHIAL WASHINGS  11/26/2021   Procedure: BRONCHIAL WASHINGS;  Surgeon: Brand Males, MD;  Location: [facility];  Service: Endoscopy;;   BUNIONECTOMY Left 2003   hammer toe as well   BUNIONECTOMY WITH HAMMERTOE RECONSTRUCTION Left 2013   repeat   CERVICAL DISC SURGERY     COLONOSCOPY WITH PROPOFOL N/A 04/30/2021   Procedure: COLONOSCOPY WITH PROPOFOL;  Surgeon: Jonathon Bellows, MD;   Location: [facility] ENDOSCOPY;  Service: Gastroenterology;  Laterality: N/A;   GREEN LIGHT LASER TURP (TRANSURETHRAL RESECTION OF PROSTATE  2010   laser, shrink prostate   KNEE ARTHROSCOPY Right 1991   LEG SURGERY Left    BENIGN BONE TUMOR   LUMBAR [place] SURGERY  2010   discectomy   LUMBAR DISC SURGERY  2011   LUMBAR LAMINECTOMY/DECOMPRESSION MICRODISCECTOMY Right 05/12/2022   Procedure: RIGHT L5-S1 DISCECTOMY;  Surgeon: Meade Maw, MD;  Location: [facility] ORS;  Service: Neurosurgery;  Laterality: Right;   PROSTATE SURGERY  2002   shrink prostate   ROTATOR CUFF REPAIR Left 1999   debride, remove bonespur   ROTATOR CUFF REPAIR Right 1996   TONSILLECTOMY     TRANSFORAMINAL LUMBAR INTERBODY FUSION W/ MIS 1 LEVEL Right 05/12/2022   Procedure: RIGHT L4-5 MINIMALLY INVASIVE (MIS) TRANSFORAMINAL LUMBAR INTERBODY FUSION (TLIF);  Surgeon: Meade Maw, MD;  Location: [facility] ORS;  Service: Neurosurgery;  Laterality: Right;   VIDEO BRONCHOSCOPY N/A 11/26/2021   Procedure: VIDEO BRONCHOSCOPY WITHOUT FLUORO;  Surgeon: Brand Males, MD;  Location: [facility];  Service: Endoscopy;  Laterality: N/A;  for chroni ccough BAL   Patient Active Problem List   Diagnosis Date Noted   Spondylolisthesis at L4-L5 level 05/12/2022   Chronic right-sided low back pain with right-sided sciatica  05/12/2022   Spondylolisthesis of lumbar region 05/12/2022   Other chronic pain    Pre-operative respiratory examination 04/26/2022   Cervical facet joint syndrome 03/09/2022   Osteoarthritis of glenohumeral joint, right 03/02/2022   Cervical spondylosis with radiculopathy 03/02/2022   Lower extremity edema 01/18/2022   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 11/17/2021   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) 11/17/2021   Lumbar radiculopathy 10/27/2021   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   History of lumbar surgery 10/27/2021   Chronic pain syndrome 10/27/2021   Postnasal  drip 10/06/2021   Derangement of right SI joint 09/28/2021   Spondylosis of lumbosacral region without myelopathy or radiculopathy 09/28/2021   Biceps tendinitis, right 08/26/2021   Supraspinatus syndrome, right 08/26/2021   Deviated septum 07/28/2021   History of chronic sinusitis 07/28/2021   Chronic pansinusitis 05/21/2021   Rhinitis 02/04/2021   Abnormal findings on diagnostic imaging of lung 07/31/2020   Chronic cough 07/31/2020   Centrilobular emphysema ([place]) 07/31/2020   Healthcare maintenance 07/31/2020   Type 2 diabetes mellitus with other specified complication ([place]) 54/49/2010   Kidney cysts 01/11/2020   Pancreatic cyst 01/11/2020   Iron deficiency anemia 01/11/2020   Hyperlipidemia associated with type 2 diabetes mellitus ([place]) 01/11/2020   Primary osteoarthritis involving multiple joints 01/11/2020   BPH associated with nocturia 01/11/2020   Cough variant asthma 08/17/2019   Essential hypertension     PCP: Olin Hauser, DO  REFERRING PROVIDER: Meade Maw, MD  REFERRING DIAG: Chronic R shoulder pain/ Cervical radiculitis  THERAPY DIAG:  Chronic right shoulder pain  Shoulder joint stiffness, right  Muscle weakness (generalized)  Cervicalgia  Neck stiffness  Rationale for Evaluation and Treatment: Rehabilitation  ONSET DATE: Chronic  SUBJECTIVE:                                                                                                                                                                                      SUBJECTIVE STATEMENT:  EVALUATION Pt. Reports 7/10 R shoulder pain at worst with reaching and 0/10 at best/ rest.  Pt. Has a 25# lifting restriction secondary to low back surgery (see MD notes).  Pt. Sees [facility]) for back/ hip adjustments.   PERTINENT HISTORY: See MD notes.  Recert lumbar surgery/ previous cervical surgery.    PAIN:  Are you having pain? Yes: NPRS scale: 7/10 Pain location: R  shoulder Pain description: sharp Aggravating factors: reaching Relieving factors: rest  PRECAUTIONS: Back  WEIGHT BEARING RESTRICTIONS: No  FALLS:  Has patient fallen in last 6 months? No  LIVING ENVIRONMENT: Lives with: lives with their spouse Lives in: House/apartment  OCCUPATION:  Retired.  Pt. Known to PT clinic/ see previous notes  PLOF: Independent  PATIENT GOALS: Decrease neck/ R shoulder pain with daily tasks.   NEXT MD VISIT: 08/03/22 with Dr. Izora Ribas  OBJECTIVE:   DIAGNOSTIC FINDINGS:  Narrative & Impression  CLINICAL DATA:  Neck pain, chronic, degenerative changes on xray. Prior C-spine fusion, now having severe right neck and shoulder pain, concern for C4/5 radiculopathy.   EXAM: MRI CERVICAL SPINE WITHOUT AND WITH CONTRAST   TECHNIQUE: Multiplanar and multiecho pulse sequences of the cervical spine, to include the craniocervical junction and cervicothoracic junction, were obtained without and with intravenous contrast.   CONTRAST:  21m GADAVIST GADOBUTROL 1 MMOL/ML IV SOLN   COMPARISON:  Cervical spine radiographs 03/02/2022   FINDINGS: Alignment: Minimal anterolisthesis of C3 on C4, C4 on C5, and C5 on C6.   Vertebrae: No fracture, suspicious marrow lesion, or significant marrow edema. Solid interbody osseous fusion at C5-6 and C6-7.   Cord: Normal cord signal and morphology.  No abnormal enhancement.   Posterior Fossa, vertebral arteries, paraspinal tissues: Unremarkable.   Disc levels:   C2-3: Minimal disc bulging, uncovertebral spurring, and moderate to severe right and mild left facet arthrosis without significant stenosis.   C3-4: Mild disc space narrowing. Anterolisthesis with bulging uncovered disc, uncovertebral spurring, and severe facet arthrosis result in mild spinal stenosis and moderate bilateral neural foraminal stenosis.   C4-5: Mild disc space narrowing. Anterolisthesis with bulging uncovered disc, asymmetric right  uncovertebral spurring, and severe right and moderate left facet arthrosis result in mild spinal stenosis and moderate right neural foraminal stenosis.   C5-6: Anterior fusion.  No stenosis.   C6-7: Anterior fusion.  No stenosis.   C7-T1: Moderate disc space narrowing. Disc bulging, uncovertebral spurring, and mild-to-moderate facet arthrosis result in mild-to-moderate right neural foraminal stenosis without spinal stenosis.   IMPRESSION: 1. Solid anterior fusion at C5-6 and C6-7 without residual stenosis. 2. Severe upper cervical facet arthrosis with mild spinal stenosis and moderate neural foraminal stenosis at C3-4 and C4-5. 3. Mild-to-moderate right neural foraminal stenosis at C7-T1.      PATIENT SURVEYS:  FOTO initial 13/ goal 579 COGNITION: Overall cognitive status: Within functional limits for tasks assessed     SENSATION: WFL  POSTURE: Rounded shoulder/ forward head posture.  UPPER EXTREMITY ROM:   Active ROM Right eval Left eval  Shoulder flexion 72 deg. 152 deg.  Shoulder extension    Shoulder abduction 68 deg. 146 deg.  Shoulder adduction    Shoulder internal rotation 50 deg. 70 deg.  Shoulder external rotation 50 deg. 74 deg.  Elbow flexion WNL WNL  Elbow extension WNL WNL  Wrist flexion WNL WNL  Wrist extension WNL WNL  Wrist ulnar deviation    Wrist radial deviation    Wrist pronation    Wrist supination    (Blank rows = not tested)  R shoulder flexion 138 deg. Passive.  Cervical AROM: flexion 25 deg./ extension 60 deg./ L rotn. 64 deg./ R rotn. 64 deg./ L lat. Flex. 24 deg./ R lat. Flex. 32 deg.    UPPER EXTREMITY MMT:  Next tx session  SHOULDER SPECIAL TESTS: Limited R shoulder ROM limits testing  JOINT MOBILITY TESTING:  Limited  PALPATION:  (+) R posterior deltoid tenderness with palpation.    Seated R shoulder flexion 3/5 MMT, abduction 3/5 MMT, extension 4/5 MMT.  Bicep 4+/5 MMT, tricep 4/5 MMT.  TODAY'S TREATMENT:  DATE: 07/27/22   SUBJECTIVE:  Pt. Reports no R shoulder/neck pain at rest prior to tx. Session.  Pt. Reports 7/10 R shoulder pain this morning doing wand exercise (overhead/ chest press).  Pt. Returns to MD next Tuesday.     There.ex.:  B UBE 3 min. F/b.      Reassessed wand ex.    Standing 5# bicep curls 30x each.  Pharmacist, hospital.       Nautilus: seated 50# lat. Pull down 20x/ standing 30# tricep extension 20x/ no chest press today/ 50# scapular retraction  15x2.      Standing yellow ball (shoulder flexion)- 10x with moderate R shoulder fatigue/ discomfort reported during last several reps.  Supine R shoulder AROM (flexion/ serratus punches/ IR/ ER) 20x.  Light OP as tolerated.    Manual tx.:   Supine/ seated STM to R UT/ posterior deltoid/ periscapular musculature.  No Hypervolt today.    Seated R shoulder PROM (all planes)- pain tolerable.     Pt. Using ice to hip at home and then R shoulder as needed.   PATIENT EDUCATION: Education details: Discussed posture/ lifting limitations Person educated: Patient Education method: Explanation Education comprehension: verbalized understanding  HOME EXERCISE PROGRAM: Pulley ex. (Flexion/ abduction)- pain-free range   ASSESSMENT:  CLINICAL IMPRESSION: Pt. presents with R shoulder ROM/ strength limitations.  No c/o neck pain t/o tx. Session.   Pt. Has no R shoulder pain at rest but marked increase in pain with active shoulder flexion/ reaching >90 deg.  Good progression with R shoulder resisted there.ex. in a modified range to manage pain symptoms.  Limited with chest press while using bar due to R shoulder limitations/ weakness.   Pt. Will continue with current HEP and use of pulley ex in a pain tolerable range.   Pt. Will benefit from skilled PT services to develop HEP to improve pain-free mobility.     OBJECTIVE IMPAIRMENTS: decreased activity tolerance, decreased mobility, decreased ROM, decreased strength, hypomobility, impaired flexibility, improper body mechanics, postural dysfunction, and pain.   ACTIVITY LIMITATIONS: carrying, lifting, standing, reach over head, and hygiene/grooming  PARTICIPATION LIMITATIONS: interpersonal relationship and community activity  PERSONAL FACTORS: Age and Past/current experiences are also affecting patient's functional outcome.   REHAB POTENTIAL: Good  CLINICAL DECISION MAKING: Evolving/moderate complexity  EVALUATION COMPLEXITY: Moderate   GOALS: Goals reviewed with patient? Yes  SHORT TERM GOALS: Target date: 07/20/22  Pt. Independent with HEP to increase cervical AROM to [facility] to improve painfree mobility.  Baseline: See above Goal status:  Goal met   LONG TERM GOALS: Target date: 08/03/22  Pt. Will increase FOTO to 52 to improve pain-free mobility.  Baseline: initial FOTO 13 Goal status: INITIAL  2.  Pt. Will report <4/10 R shoulder pain with forward reaching and carrying <20# objects to improve household tasks.  Baseline:  7/10 pain with R shoulder use Goal status: INITIAL  3.  Pt. Will be able to complete 30 minutes of exercise at gym with no increase c/o neck/ R shoulder pain.   Baseline:  pain limited Goal status: INITIAL   PLAN:  PT FREQUENCY: 2x/week  PT DURATION: 4 weeks  PLANNED INTERVENTIONS: Therapeutic exercises, Therapeutic activity, Neuromuscular re-education, Patient/Family education, Self Care, Joint mobilization, Dry Needling, Electrical stimulation, Cryotherapy, Moist heat, Manual therapy, and Re-evaluation  PLAN FOR NEXT SESSION:  Check goals/ send MD progress note  Pura Spice, PT, DPT # [phone] 07/27/2022, 6:15 PM

## 2022-07-29 ENCOUNTER — Encounter: Payer: Self-pay | Admitting: Physical Therapy

## 2022-07-29 ENCOUNTER — Ambulatory Visit: Payer: PPO | Admitting: Physical Therapy

## 2022-07-29 ENCOUNTER — Other Ambulatory Visit: Payer: Self-pay | Admitting: Family Medicine

## 2022-07-29 DIAGNOSIS — E785 Hyperlipidemia, unspecified: Secondary | ICD-10-CM

## 2022-07-29 DIAGNOSIS — M436 Torticollis: Secondary | ICD-10-CM

## 2022-07-29 DIAGNOSIS — M25511 Pain in right shoulder: Secondary | ICD-10-CM | POA: Diagnosis not present

## 2022-07-29 DIAGNOSIS — M25611 Stiffness of right shoulder, not elsewhere classified: Secondary | ICD-10-CM

## 2022-07-29 DIAGNOSIS — E1169 Type 2 diabetes mellitus with other specified complication: Secondary | ICD-10-CM

## 2022-07-29 DIAGNOSIS — M6281 Muscle weakness (generalized): Secondary | ICD-10-CM

## 2022-07-29 DIAGNOSIS — M542 Cervicalgia: Secondary | ICD-10-CM

## 2022-07-29 DIAGNOSIS — G8929 Other chronic pain: Secondary | ICD-10-CM

## 2022-07-29 NOTE — Telephone Encounter (Signed)
Requested Prescriptions  Pending Prescriptions Disp Refills   rosuvastatin (CRESTOR) 5 MG tablet [Pharmacy Med Name: rosuvastatin 5 mg tablet] 90 tablet 0    Sig: TAKE ONE TABLET BY MOUTH ONCE DAILY     Cardiovascular:  Antilipid - Statins 2 Failed - 07/29/2022  8:04 AM      Failed - Lipid Panel in normal range within the last 12 months    Cholesterol  Date Value Ref Range Status  06/21/2022 128 <200 mg/dL Final   LDL Cholesterol (Calc)  Date Value Ref Range Status  06/21/2022 56 mg/dL (calc) Final    Comment:    Reference range: <100 . Desirable range <100 mg/dL for primary prevention;   <70 mg/dL for patients with CHD or diabetic patients  with > or = 2 CHD risk factors. Marland Kitchen LDL-C is now calculated using the Martin-Hopkins  calculation, which is a validated novel method providing  better accuracy than the Friedewald equation in the  estimation of LDL-C.  Cresenciano Genre et al. Annamaria Helling. 4496;759(16): 2061-2068  (http://education.[employer].com/faq/FAQ164)    HDL  Date Value Ref Range Status  06/21/2022 58 > OR = 40 mg/dL Final   Triglycerides  Date Value Ref Range Status  06/21/2022 61 <150 mg/dL Final         Passed - Cr in normal range and within 360 days    Creatinine  Date Value Ref Range Status  09/16/2014 0.9 0.7 - 1.3 mg/dL Final   Creat  Date Value Ref Range Status  06/21/2022 0.73 0.70 - 1.28 mg/dL Final   Creatinine, Urine  Date Value Ref Range Status  06/24/2022 151 20 - 320 mg/dL Final         Passed - Patient is not pregnant      Passed - Valid encounter within last 12 months    Recent Outpatient Visits           1 month ago Annual physical exam   [facility] [place] Olin Hauser, DO   1 month ago Essential hypertension   [place], DO   4 months ago Acute rhinosinusitis   [place], DO   4 months ago Cervical spondylosis with radiculopathy    [facility], Earley Abide, MD   5 months ago Supraspinatus syndrome, right   [facility], Earley Abide, MD       Future Appointments             In 3 weeks Ralene Bathe, MD [place]   In 1 month [place], Charlann Lange, MD [place] [facility]   In 4 months [place], Devonne Doughty, [facility], [facility]

## 2022-07-29 NOTE — Therapy (Signed)
OUTPATIENT PHYSICAL THERAPY SHOULDER TREATMENT  Patient Name: Isaac Weber MRN: [id number] DOB:September 08, 1945, 77 y.o., male Today's Date: 07/29/2022  END OF SESSION:  PT End of Session - 07/29/22 1419     Visit Number 8    Number of Visits 8    Date for PT Re-Evaluation 08/03/22    PT Start Time 4765            4650 to 1505  (46 minutes).    Past Medical History:  Diagnosis Date   Abnormal chest CT    Actinic keratosis    Allergic rhinitis, unspecified    Anemia    Aortic atherosclerosis (HCC)    Arthritis    BPH (benign prostatic hyperplasia)    COPD (chronic obstructive pulmonary disease) (HCC)    Coronary artery disease    Cough variant asthma    Diabetes mellitus without complication (HCC)    Glaucoma    Hypertension    Other chronic pain    Pancreatic lesion    Pneumonia    Pure hypercholesterolemia, unspecified    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 WD SCC, ulcerated. [facility] 04/29/2020   Past Surgical History:  Procedure Laterality Date   APPLICATION OF INTRAOPERATIVE CT SCAN N/A 05/12/2022   Procedure: APPLICATION OF INTRAOPERATIVE CT SCAN;  Surgeon: Meade Maw, MD;  Location: [facility] ORS;  Service: Neurosurgery;  Laterality: N/A;   BACK SURGERY     BRONCHIAL WASHINGS  11/26/2021   Procedure: BRONCHIAL WASHINGS;  Surgeon: Brand Males, MD;  Location: [facility];  Service: Endoscopy;;   BUNIONECTOMY Left 2003   hammer toe as well   BUNIONECTOMY WITH HAMMERTOE RECONSTRUCTION Left 2013   repeat   CERVICAL DISC SURGERY     COLONOSCOPY WITH PROPOFOL N/A 04/30/2021   Procedure: COLONOSCOPY WITH PROPOFOL;  Surgeon: Jonathon Bellows, MD;  Location: [facility] ENDOSCOPY;   Service: Gastroenterology;  Laterality: N/A;   GREEN LIGHT LASER TURP (TRANSURETHRAL RESECTION OF PROSTATE  2010   laser, shrink prostate   KNEE ARTHROSCOPY Right 1991   LEG SURGERY Left    BENIGN BONE TUMOR   LUMBAR [place] SURGERY  2010   discectomy   LUMBAR DISC SURGERY  2011   LUMBAR LAMINECTOMY/DECOMPRESSION MICRODISCECTOMY Right 05/12/2022   Procedure: RIGHT L5-S1 DISCECTOMY;  Surgeon: Meade Maw, MD;  Location: [facility] ORS;  Service: Neurosurgery;  Laterality: Right;   PROSTATE SURGERY  2002   shrink prostate   ROTATOR CUFF REPAIR Left 1999   debride, remove bonespur   ROTATOR CUFF REPAIR Right 1996   TONSILLECTOMY     TRANSFORAMINAL LUMBAR INTERBODY FUSION W/ MIS 1 LEVEL Right 05/12/2022   Procedure: RIGHT L4-5 MINIMALLY INVASIVE (MIS) TRANSFORAMINAL LUMBAR INTERBODY FUSION (TLIF);  Surgeon: Meade Maw, MD;  Location: [facility] ORS;  Service: Neurosurgery;  Laterality: Right;   VIDEO BRONCHOSCOPY N/A 11/26/2021   Procedure: VIDEO BRONCHOSCOPY WITHOUT FLUORO;  Surgeon: Brand Males, MD;  Location: [facility];  Service: Endoscopy;  Laterality: N/A;  for chroni ccough BAL   Patient Active Problem List   Diagnosis Date Noted   Spondylolisthesis at L4-L5 level 05/12/2022   Chronic right-sided low back pain with right-sided sciatica 05/12/2022   Spondylolisthesis of lumbar region 05/12/2022  Other chronic pain    Pre-operative respiratory examination 04/26/2022   Cervical facet joint syndrome 03/09/2022   Osteoarthritis of glenohumeral joint, right 03/02/2022   Cervical spondylosis with radiculopathy 03/02/2022   Lower extremity edema 01/18/2022   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 11/17/2021   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) 11/17/2021   Lumbar radiculopathy 10/27/2021   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   History of lumbar surgery 10/27/2021   Chronic pain syndrome 10/27/2021   Postnasal drip 10/06/2021    Derangement of right SI joint 09/28/2021   Spondylosis of lumbosacral region without myelopathy or radiculopathy 09/28/2021   Biceps tendinitis, right 08/26/2021   Supraspinatus syndrome, right 08/26/2021   Deviated septum 07/28/2021   History of chronic sinusitis 07/28/2021   Chronic pansinusitis 05/21/2021   Rhinitis 02/04/2021   Abnormal findings on diagnostic imaging of lung 07/31/2020   Chronic cough 07/31/2020   Centrilobular emphysema ([place]) 07/31/2020   Healthcare maintenance 07/31/2020   Type 2 diabetes mellitus with other specified complication ([place]) 16/04/9603   Kidney cysts 01/11/2020   Pancreatic cyst 01/11/2020   Iron deficiency anemia 01/11/2020   Hyperlipidemia associated with type 2 diabetes mellitus ([place]) 01/11/2020   Primary osteoarthritis involving multiple joints 01/11/2020   BPH associated with nocturia 01/11/2020   Cough variant asthma 08/17/2019   Essential hypertension     PCP: Olin Hauser, DO  REFERRING PROVIDER: Meade Maw, MD  REFERRING DIAG: Chronic R shoulder pain/ Cervical radiculitis  THERAPY DIAG:  Chronic right shoulder pain  Shoulder joint stiffness, right  Muscle weakness (generalized)  Cervicalgia  Neck stiffness  Rationale for Evaluation and Treatment: Rehabilitation  ONSET DATE: Chronic  SUBJECTIVE:                                                                                                                                                                                      SUBJECTIVE STATEMENT:  EVALUATION Pt. Reports 7/10 R shoulder pain at worst with reaching and 0/10 at best/ rest.  Pt. Has a 25# lifting restriction secondary to low back surgery (see MD notes).  Pt. Sees [facility]) for back/ hip adjustments.   PERTINENT HISTORY: See MD notes.  Recert lumbar surgery/ previous cervical surgery.    PAIN:  Are you having pain? Yes: NPRS scale: 7/10 Pain location: R shoulder Pain description:  sharp Aggravating factors: reaching Relieving factors: rest  PRECAUTIONS: Back  WEIGHT BEARING RESTRICTIONS: No  FALLS:  Has patient fallen in last 6 months? No  LIVING ENVIRONMENT: Lives with: lives with their spouse Lives in: House/apartment  OCCUPATION: Retired.  Pt. Known to PT clinic/ see previous notes  PLOF: Independent  PATIENT GOALS: Decrease neck/ R shoulder pain with daily tasks.   NEXT MD VISIT: 08/03/22 with Dr. Izora Ribas  OBJECTIVE:   DIAGNOSTIC FINDINGS:  Narrative & Impression  CLINICAL DATA:  Neck pain, chronic, degenerative changes on xray. Prior C-spine fusion, now having severe right neck and shoulder pain, concern for C4/5 radiculopathy.   EXAM: MRI CERVICAL SPINE WITHOUT AND WITH CONTRAST   TECHNIQUE: Multiplanar and multiecho pulse sequences of the cervical spine, to include the craniocervical junction and cervicothoracic junction, were obtained without and with intravenous contrast.   CONTRAST:  11m GADAVIST GADOBUTROL 1 MMOL/ML IV SOLN   COMPARISON:  Cervical spine radiographs 03/02/2022   FINDINGS: Alignment: Minimal anterolisthesis of C3 on C4, C4 on C5, and C5 on C6.   Vertebrae: No fracture, suspicious marrow lesion, or significant marrow edema. Solid interbody osseous fusion at C5-6 and C6-7.   Cord: Normal cord signal and morphology.  No abnormal enhancement.   Posterior Fossa, vertebral arteries, paraspinal tissues: Unremarkable.   Disc levels:   C2-3: Minimal disc bulging, uncovertebral spurring, and moderate to severe right and mild left facet arthrosis without significant stenosis.   C3-4: Mild disc space narrowing. Anterolisthesis with bulging uncovered disc, uncovertebral spurring, and severe facet arthrosis result in mild spinal stenosis and moderate bilateral neural foraminal stenosis.   C4-5: Mild disc space narrowing. Anterolisthesis with bulging uncovered disc, asymmetric right uncovertebral spurring, and  severe right and moderate left facet arthrosis result in mild spinal stenosis and moderate right neural foraminal stenosis.   C5-6: Anterior fusion.  No stenosis.   C6-7: Anterior fusion.  No stenosis.   C7-T1: Moderate disc space narrowing. Disc bulging, uncovertebral spurring, and mild-to-moderate facet arthrosis result in mild-to-moderate right neural foraminal stenosis without spinal stenosis.   IMPRESSION: 1. Solid anterior fusion at C5-6 and C6-7 without residual stenosis. 2. Severe upper cervical facet arthrosis with mild spinal stenosis and moderate neural foraminal stenosis at C3-4 and C4-5. 3. Mild-to-moderate right neural foraminal stenosis at C7-T1.      PATIENT SURVEYS:  FOTO initial 13/ goal 584 COGNITION: Overall cognitive status: Within functional limits for tasks assessed     SENSATION: WFL  POSTURE: Rounded shoulder/ forward head posture.  UPPER EXTREMITY ROM:   Active ROM Right eval Left eval  Shoulder flexion 72 deg. 152 deg.  Shoulder extension    Shoulder abduction 68 deg. 146 deg.  Shoulder adduction    Shoulder internal rotation 50 deg. 70 deg.  Shoulder external rotation 50 deg. 74 deg.  Elbow flexion WNL WNL  Elbow extension WNL WNL  Wrist flexion WNL WNL  Wrist extension WNL WNL  Wrist ulnar deviation    Wrist radial deviation    Wrist pronation    Wrist supination    (Blank rows = not tested)  R shoulder flexion 138 deg. Passive.  Cervical AROM: flexion 25 deg./ extension 60 deg./ L rotn. 64 deg./ R rotn. 64 deg./ L lat. Flex. 24 deg./ R lat. Flex. 32 deg.    UPPER EXTREMITY MMT:  Next tx session  SHOULDER SPECIAL TESTS: Limited R shoulder ROM limits testing  JOINT MOBILITY TESTING:  Limited  PALPATION:  (+) R posterior deltoid tenderness with palpation.    Seated R shoulder flexion 3/5 MMT, abduction 3/5 MMT, extension 4/5 MMT.  Bicep 4+/5 MMT, tricep 4/5 MMT.  TODAY'S TREATMENT:  DATE: 07/29/22   SUBJECTIVE:  Pt. Reports no R shoulder/neck pain at rest prior to tx. Session.  Pt. Returns to MD next Tuesday.     There.ex.:  B UBE 3 min. F/b.   Discussed upcoming weekend.    Standing L shoulder flexion AROM 10x.  PT assist for scapular control/ mirror feedback.  Standing shoulder shrugs/ rolls 10x CW/CCW.    Nautilus: seated 50# lat. Pull down 20x/ standing 30# tricep extension 20x/ 50# scapular retraction 20x.      Standing yellow ball (shoulder flexion)- 10x with moderate R shoulder fatigue/ discomfort reported during last several reps.  Supine R shoulder AROM (flexion/ serratus punches/ IR/ ER) 20x.  Light OP as tolerated.    Manual tx.: <5 min. (No charge).    Supine/ seated STM to R UT/ posterior deltoid/ periscapular musculature.  No Hypervolt today.    Seated R shoulder PROM (all planes)- pain tolerable.     Pt. Using ice to hip at home and then R shoulder as needed.   PATIENT EDUCATION: Education details: Discussed posture/ lifting limitations Person educated: Patient Education method: Explanation Education comprehension: verbalized understanding  HOME EXERCISE PROGRAM: Pulley ex. (Flexion/ abduction)- pain-free range   ASSESSMENT:  CLINICAL IMPRESSION: Pt. presents with R shoulder ROM/ strength limitations.  No c/o neck pain t/o tx. Session.   Pt. Has no R shoulder pain at rest but marked increase in pain with active shoulder flexion/ reaching >90 deg.  Good progression with R shoulder resisted there.ex. in a modified range to manage pain symptoms.  Limited with chest press while using bar due to R shoulder limitations/ weakness.   Pt. Will continue with current HEP and use of pulley ex in a pain tolerable range.   Pt. Will benefit from skilled PT services to develop HEP to improve pain-free mobility.    OBJECTIVE IMPAIRMENTS: decreased activity  tolerance, decreased mobility, decreased ROM, decreased strength, hypomobility, impaired flexibility, improper body mechanics, postural dysfunction, and pain.   ACTIVITY LIMITATIONS: carrying, lifting, standing, reach over head, and hygiene/grooming  PARTICIPATION LIMITATIONS: interpersonal relationship and community activity  PERSONAL FACTORS: Age and Past/current experiences are also affecting patient's functional outcome.   REHAB POTENTIAL: Good  CLINICAL DECISION MAKING: Evolving/moderate complexity  EVALUATION COMPLEXITY: Moderate   GOALS: Goals reviewed with patient? Yes  SHORT TERM GOALS: Target date: 07/20/22  Pt. Independent with HEP to increase cervical AROM to [facility] to improve painfree mobility.  Baseline: See above Goal status:  Goal met   LONG TERM GOALS: Target date: 08/03/22  Pt. Will increase FOTO to 52 to improve pain-free mobility.  Baseline: initial FOTO 13 Goal status: INITIAL  2.  Pt. Will report <4/10 R shoulder pain with forward reaching and carrying <20# objects to improve household tasks.  Baseline:  7/10 pain with R shoulder use Goal status: INITIAL  3.  Pt. Will be able to complete 30 minutes of exercise at gym with no increase c/o neck/ R shoulder pain.   Baseline:  pain limited Goal status: INITIAL   PLAN:  PT FREQUENCY: 2x/week  PT DURATION: 4 weeks  PLANNED INTERVENTIONS: Therapeutic exercises, Therapeutic activity, Neuromuscular re-education, Patient/Family education, Self Care, Joint mobilization, Dry Needling, Electrical stimulation, Cryotherapy, Moist heat, Manual therapy, and Re-evaluation  PLAN FOR NEXT SESSION:  Check goals/ send MD progress note  Pura Spice, PT, DPT # [phone] 07/29/2022, 2:20 PM

## 2022-08-02 ENCOUNTER — Other Ambulatory Visit (HOSPITAL_COMMUNITY): Payer: Self-pay

## 2022-08-02 ENCOUNTER — Other Ambulatory Visit: Payer: Self-pay

## 2022-08-02 DIAGNOSIS — Z981 Arthrodesis status: Secondary | ICD-10-CM

## 2022-08-02 MED ORDER — KETOCONAZOLE 2 % EX SHAM
MEDICATED_SHAMPOO | CUTANEOUS | 1 refills | Status: DC
  Filled 2022-08-02: qty 120, 30d supply, fill #0

## 2022-08-03 ENCOUNTER — Ambulatory Visit
Admission: RE | Admit: 2022-08-03 | Discharge: 2022-08-03 | Disposition: A | Discharge status: Home / Self Care | Payer: PPO | Source: Ambulatory Visit | Attending: Neurosurgery | Admitting: Neurosurgery

## 2022-08-03 ENCOUNTER — Encounter: Payer: Self-pay | Admitting: Neurosurgery

## 2022-08-03 ENCOUNTER — Ambulatory Visit: Payer: PPO | Admitting: Physical Therapy

## 2022-08-03 ENCOUNTER — Encounter: Payer: PPO | Admitting: Neurosurgery

## 2022-08-03 ENCOUNTER — Other Ambulatory Visit (HOSPITAL_COMMUNITY): Payer: Self-pay

## 2022-08-03 ENCOUNTER — Encounter: Payer: Self-pay | Admitting: Physical Therapy

## 2022-08-03 ENCOUNTER — Ambulatory Visit
Admission: RE | Admit: 2022-08-03 | Discharge: 2022-08-03 | Disposition: A | Discharge status: Home / Self Care | Payer: PPO | Attending: Neurosurgery | Admitting: Neurosurgery

## 2022-08-03 ENCOUNTER — Encounter (HOSPITAL_COMMUNITY): Payer: Self-pay

## 2022-08-03 ENCOUNTER — Other Ambulatory Visit: Payer: Self-pay | Admitting: Family Medicine

## 2022-08-03 DIAGNOSIS — Z981 Arthrodesis status: Secondary | ICD-10-CM | POA: Insufficient documentation

## 2022-08-03 DIAGNOSIS — M436 Torticollis: Secondary | ICD-10-CM

## 2022-08-03 DIAGNOSIS — G8929 Other chronic pain: Secondary | ICD-10-CM

## 2022-08-03 DIAGNOSIS — M249 Joint derangement, unspecified: Secondary | ICD-10-CM

## 2022-08-03 DIAGNOSIS — M542 Cervicalgia: Secondary | ICD-10-CM

## 2022-08-03 DIAGNOSIS — M25611 Stiffness of right shoulder, not elsewhere classified: Secondary | ICD-10-CM

## 2022-08-03 DIAGNOSIS — M25511 Pain in right shoulder: Secondary | ICD-10-CM | POA: Diagnosis not present

## 2022-08-03 DIAGNOSIS — M4316 Spondylolisthesis, lumbar region: Secondary | ICD-10-CM | POA: Diagnosis not present

## 2022-08-03 DIAGNOSIS — M47816 Spondylosis without myelopathy or radiculopathy, lumbar region: Secondary | ICD-10-CM | POA: Diagnosis not present

## 2022-08-03 DIAGNOSIS — M6281 Muscle weakness (generalized): Secondary | ICD-10-CM

## 2022-08-03 DIAGNOSIS — I10 Essential (primary) hypertension: Secondary | ICD-10-CM

## 2022-08-03 DIAGNOSIS — E1169 Type 2 diabetes mellitus with other specified complication: Secondary | ICD-10-CM

## 2022-08-03 MED ORDER — AMLODIPINE BESYLATE 5 MG PO TABS
5.0000 mg | ORAL_TABLET | Freq: Every day | ORAL | 1 refills | Status: DC
  Filled 2022-08-03: qty 90, 90d supply, fill #0
  Filled 2022-10-28: qty 90, 90d supply, fill #1

## 2022-08-03 MED ORDER — HYDROCHLOROTHIAZIDE 12.5 MG PO TABS
12.5000 mg | ORAL_TABLET | Freq: Every day | ORAL | 1 refills | Status: DC
  Filled 2022-08-03: qty 90, 90d supply, fill #0
  Filled 2022-10-28: qty 90, 90d supply, fill #1

## 2022-08-03 NOTE — Telephone Encounter (Signed)
Requested Prescriptions  Pending Prescriptions Disp Refills   rosuvastatin (CRESTOR) 5 MG tablet 90 tablet 0    Sig: TAKE ONE TABLET BY MOUTH ONCE DAILY     Cardiovascular:  Antilipid - Statins 2 Failed - 08/03/2022  3:58 PM      Failed - Lipid Panel in normal range within the last 12 months    Cholesterol  Date Value Ref Range Status  06/21/2022 128 <200 mg/dL Final   LDL Cholesterol (Calc)  Date Value Ref Range Status  06/21/2022 56 mg/dL (calc) Final    Comment:    Reference range: <100 . Desirable range <100 mg/dL for primary prevention;   <70 mg/dL for patients with CHD or diabetic patients  with > or = 2 CHD risk factors. Marland Kitchen LDL-C is now calculated using the Martin-Hopkins  calculation, which is a validated novel method providing  better accuracy than the Friedewald equation in the  estimation of LDL-C.  Cresenciano Genre et al. Annamaria Helling. 4827;078(67): 2061-2068  (http://education.[facility].com/faq/FAQ164)    HDL  Date Value Ref Range Status  06/21/2022 58 > OR = 40 mg/dL Final   Triglycerides  Date Value Ref Range Status  06/21/2022 61 <150 mg/dL Final         Passed - Cr in normal range and within 360 days    Creatinine  Date Value Ref Range Status  09/16/2014 0.9 0.7 - 1.3 mg/dL Final   Creat  Date Value Ref Range Status  06/21/2022 0.73 0.70 - 1.28 mg/dL Final   Creatinine, Urine  Date Value Ref Range Status  06/24/2022 151 20 - 320 mg/dL Final         Passed - Patient is not pregnant      Passed - Valid encounter within last 12 months    Recent Outpatient Visits           1 month ago Annual physical exam   [place], DO   2 months ago Essential hypertension   [place], DO   4 months ago Acute rhinosinusitis   [place], DO   5 months ago Cervical spondylosis with radiculopathy   [place] [facility] at [facility], Earley Abide, MD   5 months ago Supraspinatus syndrome, right   [facility] at [facility], Earley Abide, MD       Future Appointments             In 2 weeks Ralene Bathe, MD [place]   In 1 month [place], Charlann Lange, MD [address] [facility] Of [place]. [facility], [facility]   In 4 months [place], Devonne Doughty, DO [facility], [place]            Signed Prescriptions Disp Refills   hydrochlorothiazide (HYDRODIURIL) 12.5 MG tablet 90 tablet 1    Sig: TAKE ONE TABLET BY MOUTH ONCE DAILY     Cardiovascular: Diuretics - Thiazide Passed - 08/03/2022  3:58 PM      Passed - Cr in normal range and within 180 days    Creatinine  Date Value Ref Range Status  09/16/2014 0.9 0.7 - 1.3 mg/dL Final   Creat  Date Value Ref Range Status  06/21/2022 0.73 0.70 - 1.28 mg/dL Final   Creatinine, Urine  Date Value Ref Range Status  06/24/2022 151  20 - 320 mg/dL Final         Passed - K in normal range and within 180 days    Potassium  Date Value Ref Range Status  06/21/2022 4.0 3.5 - 5.3 mmol/L Final  09/16/2014 3.7 3.5 - 5.1 mEq/L Final         Passed - Na in normal range and within 180 days    Sodium  Date Value Ref Range Status  06/21/2022 138 135 - 146 mmol/L Final  09/16/2014 140 136 - 145 mEq/L Final         Passed - Last BP in normal range    BP Readings from Last 1 Encounters:  06/24/22 124/68         Passed - Valid encounter within last 6 months    Recent Outpatient Visits           1 month ago Annual physical exam   [place], DO   2 months ago Essential hypertension   [place], DO   4 months ago Acute rhinosinusitis   [place], DO   5 months ago Cervical spondylosis with radiculopathy   [place]  Primary Care and Sports Medicine at [facility], Earley Abide, MD   5 months ago Supraspinatus syndrome, right   [facility] Care and Sports Medicine at [facility], Earley Abide, MD       Future Appointments             In 2 weeks Ralene Bathe, MD [place]   In 1 month [place], Charlann Lange, MD [address] [facility] Dept Of [place]. [facility], [facility]   In 4 months [place], [facility], [facility]             amLODipine (NORVASC) 5 MG tablet 90 tablet 1    Sig: TAKE ONE TABLET BY MOUTH ONCE DAILY     Cardiovascular: Calcium Channel Blockers 2 Failed - 08/03/2022  3:58 PM      Failed - Last Heart Rate in normal range    Pulse Readings from Last 1 Encounters:  06/24/22 (!) 118         Passed - Last BP in normal range    BP Readings from Last 1 Encounters:  06/24/22 124/68         Passed - Valid encounter within last 6 months    Recent Outpatient Visits           1 month ago Annual physical exam   [facility] Olin Hauser, DO   2 months ago Essential hypertension   [place], DO   4 months ago Acute rhinosinusitis   [place], DO   5 months ago Cervical spondylosis with radiculopathy   [place] [facility] Medicine at [facility], Earley Abide, MD   5 months ago Supraspinatus syndrome, right   [facility] and Sports Medicine at [facility], Earley Abide, MD       Future Appointments             In 2 weeks Ralene Bathe, MD [place]   In 1 month [place], Charlann Lange, MD [address] [facility] Of [place]. [facility], [facility]   In 4 months [place], Devonne Doughty, DO [facility], [facility]

## 2022-08-03 NOTE — Telephone Encounter (Signed)
Requested Prescriptions  Pending Prescriptions Disp Refills   hydrochlorothiazide (HYDRODIURIL) 12.5 MG tablet 90 tablet 1    Sig: TAKE ONE TABLET BY MOUTH ONCE DAILY     Cardiovascular: Diuretics - Thiazide Passed - 08/03/2022  3:58 PM      Passed - Cr in normal range and within 180 days    Creatinine  Date Value Ref Range Status  09/16/2014 0.9 0.7 - 1.3 mg/dL Final   Creat  Date Value Ref Range Status  06/21/2022 0.73 0.70 - 1.28 mg/dL Final   Creatinine, Urine  Date Value Ref Range Status  06/24/2022 151 20 - 320 mg/dL Final         Passed - K in normal range and within 180 days    Potassium  Date Value Ref Range Status  06/21/2022 4.0 3.5 - 5.3 mmol/L Final  09/16/2014 3.7 3.5 - 5.1 mEq/L Final         Passed - Na in normal range and within 180 days    Sodium  Date Value Ref Range Status  06/21/2022 138 135 - 146 mmol/L Final  09/16/2014 140 136 - 145 mEq/L Final         Passed - Last BP in normal range    BP Readings from Last 1 Encounters:  06/24/22 124/68         Passed - Valid encounter within last 6 months    Recent Outpatient Visits           1 month ago Annual physical exam   [place], DO   2 months ago Essential hypertension   [place], DO   4 months ago Acute rhinosinusitis   [place], DO   5 months ago Cervical spondylosis with radiculopathy   [place] [facility] Medicine at [facility], Earley Abide, MD   5 months ago Supraspinatus syndrome, right   [facility] and Sports Medicine at [facility], Earley Abide, MD       Future Appointments             In 2 weeks Ralene Bathe, MD [place]   In 1 month [place], Charlann Lange, MD [address] [facility] Dept Of [place]. [facility], [facility]   In 4 months [place], [facility], [facility]             amLODipine (NORVASC) 5 MG tablet 90 tablet 1    Sig: TAKE ONE TABLET BY MOUTH ONCE DAILY     Cardiovascular: Calcium Channel Blockers 2 Failed - 08/03/2022  3:58 PM      Failed - Last Heart Rate in normal range    Pulse Readings from Last 1 Encounters:  06/24/22 (!) 118         Passed - Last BP in normal range    BP Readings from Last 1 Encounters:  06/24/22 124/68         Passed - Valid encounter within last 6 months    Recent Outpatient Visits           1 month ago Annual physical exam   [facility] Olin Hauser, DO   2 months ago Essential hypertension   [place], DO   4 months ago Acute rhinosinusitis   [place]  [place], DO   5 months ago Cervical spondylosis with radiculopathy   [place] [facility] and Sports Medicine at [facility], Earley Abide, MD   5 months ago Supraspinatus syndrome, right   [facility] Medicine at [facility], Earley Abide, MD       Future Appointments             In 2 weeks Ralene Bathe, MD [place]   In 1 month [place], MD [address] [facility] Of [place]. [facility], [facility]   In 4 months [place], Devonne Doughty, DO [facility], [facility]             rosuvastatin (CRESTOR) 5 MG tablet 90 tablet 0    Sig: TAKE ONE TABLET BY MOUTH ONCE DAILY     Cardiovascular:  Antilipid - Statins 2 Failed - 08/03/2022  3:58 PM      Failed - Lipid Panel in normal range within the last 12 months    Cholesterol  Date Value Ref Range Status  06/21/2022 128 <200 mg/dL Final   LDL Cholesterol (Calc)  Date Value Ref Range Status  06/21/2022 56 mg/dL (calc) Final    Comment:    Reference range: <100 . Desirable range <100 mg/dL for primary prevention;   <70 mg/dL for patients with  CHD or diabetic patients  with > or = 2 CHD risk factors. Marland Kitchen LDL-C is now calculated using the Martin-Hopkins  calculation, which is a validated novel method providing  better accuracy than the Friedewald equation in the  estimation of LDL-C.  Cresenciano Genre et al. Annamaria Helling. 0102;725(36): 2061-2068  (http://education.[facility].com/faq/FAQ164)    HDL  Date Value Ref Range Status  06/21/2022 58 > OR = 40 mg/dL Final   Triglycerides  Date Value Ref Range Status  06/21/2022 61 <150 mg/dL Final         Passed - Cr in normal range and within 360 days    Creatinine  Date Value Ref Range Status  09/16/2014 0.9 0.7 - 1.3 mg/dL Final   Creat  Date Value Ref Range Status  06/21/2022 0.73 0.70 - 1.28 mg/dL Final   Creatinine, Urine  Date Value Ref Range Status  06/24/2022 151 20 - 320 mg/dL Final         Passed - Patient is not pregnant      Passed - Valid encounter within last 12 months    Recent Outpatient Visits           1 month ago Annual physical exam   [place], DO   2 months ago Essential hypertension   [place], DO   4 months ago Acute rhinosinusitis   [place], DO   5 months ago Cervical spondylosis with radiculopathy   [place] [facility] Medicine at [facility], Earley Abide, MD   5 months ago Supraspinatus syndrome, right   [facility] at [facility], Earley Abide, MD       Future Appointments             In 2 weeks Ralene Bathe, MD [place]   In 1 month [place], Charlann Lange, MD [address] [facility] Dept Of [place]. [facility], [facility]   In  4 months [place], [facility], [facility]

## 2022-08-03 NOTE — Therapy (Signed)
OUTPATIENT PHYSICAL THERAPY SHOULDER TREATMENT  Patient Name: Isaac Weber MRN: [id number] DOB:October 17, 1945, 77 y.o., male Today's Date: 08/03/2022  END OF SESSION:  PT End of Session - 08/03/22 1121     Visit Number 9    Number of Visits 9    Date for PT Re-Evaluation 08/03/22    PT Start Time 1117    PT Stop Time 2725    PT Time Calculation (min) 44 min             Past Medical History:  Diagnosis Date   Abnormal chest CT    Actinic keratosis    Allergic rhinitis, unspecified    Anemia    Aortic atherosclerosis ([place])    Arthritis    BPH (benign prostatic hyperplasia)    COPD (chronic obstructive pulmonary disease) ([place])    Coronary artery disease    Cough variant asthma    Diabetes mellitus without complication (HCC)    Glaucoma    Hypertension    Other chronic pain    Pancreatic lesion    Pneumonia    Pure hypercholesterolemia, unspecified    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 WD SCC, ulcerated. [facility] 04/29/2020   Past Surgical History:  Procedure Laterality Date   APPLICATION OF INTRAOPERATIVE CT SCAN N/A 05/12/2022   Procedure: APPLICATION OF INTRAOPERATIVE CT SCAN;  Surgeon: Meade Maw, MD;  Location: [facility] ORS;  Service: Neurosurgery;  Laterality: N/A;   BACK SURGERY     BRONCHIAL WASHINGS  11/26/2021   Procedure: BRONCHIAL WASHINGS;  Surgeon: Brand Males, MD;  Location: [facility] ENDOS[facility];  Service: Endoscopy;;   BUNIONECTOMY Left 2003   hammer toe as well   BUNIONECTOMY WITH HAMMERTOE RECONSTRUCTION Left 2013   repeat   CERVICAL DISC SURGERY     COLONOSCOPY WITH PROPOFOL N/A 04/30/2021   Procedure: COLONOSCOPY WITH PROPOFOL;  Surgeon: Jonathon Bellows, MD;   Location: [facility] ENDOSCOPY;  Service: Gastroenterology;  Laterality: N/A;   GREEN LIGHT LASER TURP (TRANSURETHRAL RESECTION OF PROSTATE  2010   laser, shrink prostate   KNEE ARTHROSCOPY Right 1991   LEG SURGERY Left    BENIGN BONE TUMOR   LUMBAR [place] SURGERY  2010   discectomy   LUMBAR DISC SURGERY  2011   LUMBAR LAMINECTOMY/DECOMPRESSION MICRODISCECTOMY Right 05/12/2022   Procedure: RIGHT L5-S1 DISCECTOMY;  Surgeon: Meade Maw, MD;  Location: [facility] ORS;  Service: Neurosurgery;  Laterality: Right;   PROSTATE SURGERY  2002   shrink prostate   ROTATOR CUFF REPAIR Left 1999   debride, remove bonespur   ROTATOR CUFF REPAIR Right 1996   TONSILLECTOMY     TRANSFORAMINAL LUMBAR INTERBODY FUSION W/ MIS 1 LEVEL Right 05/12/2022   Procedure: RIGHT L4-5 MINIMALLY INVASIVE (MIS) TRANSFORAMINAL LUMBAR INTERBODY FUSION (TLIF);  Surgeon: Meade Maw, MD;  Location: [facility] ORS;  Service: Neurosurgery;  Laterality: Right;   VIDEO BRONCHOSCOPY N/A 11/26/2021   Procedure: VIDEO BRONCHOSCOPY WITHOUT FLUORO;  Surgeon: Brand Males, MD;  Location: [facility];  Service: Endoscopy;  Laterality: N/A;  for chroni ccough BAL   Patient Active Problem List   Diagnosis Date Noted   Spondylolisthesis at L4-L5 level 05/12/2022   Chronic right-sided low back pain with right-sided sciatica  05/12/2022   Spondylolisthesis of lumbar region 05/12/2022   Other chronic pain    Pre-operative respiratory examination 04/26/2022   Cervical facet joint syndrome 03/09/2022   Osteoarthritis of glenohumeral joint, right 03/02/2022   Cervical spondylosis with radiculopathy 03/02/2022   Lower extremity edema 01/18/2022   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 11/17/2021   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) 11/17/2021   Lumbar radiculopathy 10/27/2021   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   History of lumbar surgery 10/27/2021   Chronic pain syndrome 10/27/2021   Postnasal  drip 10/06/2021   Derangement of right SI joint 09/28/2021   Spondylosis of lumbosacral region without myelopathy or radiculopathy 09/28/2021   Biceps tendinitis, right 08/26/2021   Supraspinatus syndrome, right 08/26/2021   Deviated septum 07/28/2021   History of chronic sinusitis 07/28/2021   Chronic pansinusitis 05/21/2021   Rhinitis 02/04/2021   Abnormal findings on diagnostic imaging of lung 07/31/2020   Chronic cough 07/31/2020   Centrilobular emphysema ([place]) 07/31/2020   Healthcare maintenance 07/31/2020   Type 2 diabetes mellitus with other specified complication ([place]) 35/32/9924   Kidney cysts 01/11/2020   Pancreatic cyst 01/11/2020   Iron deficiency anemia 01/11/2020   Hyperlipidemia associated with type 2 diabetes mellitus ([place]) 01/11/2020   Primary osteoarthritis involving multiple joints 01/11/2020   BPH associated with nocturia 01/11/2020   Cough variant asthma 08/17/2019   Essential hypertension     PCP: Olin Hauser, DO  REFERRING PROVIDER: Meade Maw, MD  REFERRING DIAG: Chronic R shoulder pain/ Cervical radiculitis  THERAPY DIAG:  Chronic right shoulder pain  Shoulder joint stiffness, right  Muscle weakness (generalized)  Cervicalgia  Neck stiffness  Derangement of right SI joint  Rationale for Evaluation and Treatment: Rehabilitation  ONSET DATE: Chronic  SUBJECTIVE:                                                                                                                                                                                      SUBJECTIVE STATEMENT:  EVALUATION Pt. Reports 7/10 R shoulder pain at worst with reaching and 0/10 at best/ rest.  Pt. Has a 25# lifting restriction secondary to low back surgery (see MD notes).  Pt. Sees Tim Beshal [facility]) for back/ hip adjustments.   PERTINENT HISTORY: See MD notes.  Recert lumbar surgery/ previous cervical surgery.    PAIN:  Are you having pain? Yes: NPRS scale:  7/10 Pain location: R shoulder Pain description: sharp Aggravating factors: reaching Relieving factors: rest  PRECAUTIONS: Back  WEIGHT BEARING RESTRICTIONS: No  FALLS:  Has patient fallen in last 6 months? No  LIVING ENVIRONMENT: Lives with: lives with their  spouse Lives in: House/apartment  OCCUPATION: Retired.  Pt. Known to PT clinic/ see previous notes  PLOF: Independent  PATIENT GOALS: Decrease neck/ R shoulder pain with daily tasks.   NEXT MD VISIT: 08/03/22 with Dr. Izora Ribas  OBJECTIVE:   DIAGNOSTIC FINDINGS:  Narrative & Impression  CLINICAL DATA:  Neck pain, chronic, degenerative changes on xray. Prior C-spine fusion, now having severe right neck and shoulder pain, concern for C4/5 radiculopathy.   EXAM: MRI CERVICAL SPINE WITHOUT AND WITH CONTRAST   TECHNIQUE: Multiplanar and multiecho pulse sequences of the cervical spine, to include the craniocervical junction and cervicothoracic junction, were obtained without and with intravenous contrast.   CONTRAST:  88m GADAVIST GADOBUTROL 1 MMOL/ML IV SOLN   COMPARISON:  Cervical spine radiographs 03/02/2022   FINDINGS: Alignment: Minimal anterolisthesis of C3 on C4, C4 on C5, and C5 on C6.   Vertebrae: No fracture, suspicious marrow lesion, or significant marrow edema. Solid interbody osseous fusion at C5-6 and C6-7.   Cord: Normal cord signal and morphology.  No abnormal enhancement.   Posterior Fossa, vertebral arteries, paraspinal tissues: Unremarkable.   Disc levels:   C2-3: Minimal disc bulging, uncovertebral spurring, and moderate to severe right and mild left facet arthrosis without significant stenosis.   C3-4: Mild disc space narrowing. Anterolisthesis with bulging uncovered disc, uncovertebral spurring, and severe facet arthrosis result in mild spinal stenosis and moderate bilateral neural foraminal stenosis.   C4-5: Mild disc space narrowing. Anterolisthesis with bulging uncovered  disc, asymmetric right uncovertebral spurring, and severe right and moderate left facet arthrosis result in mild spinal stenosis and moderate right neural foraminal stenosis.   C5-6: Anterior fusion.  No stenosis.   C6-7: Anterior fusion.  No stenosis.   C7-T1: Moderate disc space narrowing. Disc bulging, uncovertebral spurring, and mild-to-moderate facet arthrosis result in mild-to-moderate right neural foraminal stenosis without spinal stenosis.   IMPRESSION: 1. Solid anterior fusion at C5-6 and C6-7 without residual stenosis. 2. Severe upper cervical facet arthrosis with mild spinal stenosis and moderate neural foraminal stenosis at C3-4 and C4-5. 3. Mild-to-moderate right neural foraminal stenosis at C7-T1.      PATIENT SURVEYS:  FOTO initial 13/ goal 562 COGNITION: Overall cognitive status: Within functional limits for tasks assessed     SENSATION: WFL  POSTURE: Rounded shoulder/ forward head posture.  UPPER EXTREMITY ROM:   Active ROM Right eval Left eval  Shoulder flexion 72 deg. 152 deg.  Shoulder extension    Shoulder abduction 68 deg. 146 deg.  Shoulder adduction    Shoulder internal rotation 50 deg. 70 deg.  Shoulder external rotation 50 deg. 74 deg.  Elbow flexion WNL WNL  Elbow extension WNL WNL  Wrist flexion WNL WNL  Wrist extension WNL WNL  Wrist ulnar deviation    Wrist radial deviation    Wrist pronation    Wrist supination    (Blank rows = not tested)  R shoulder flexion 138 deg. Passive.  Cervical AROM: flexion 25 deg./ extension 60 deg./ L rotn. 64 deg./ R rotn. 64 deg./ L lat. Flex. 24 deg./ R lat. Flex. 32 deg.    UPPER EXTREMITY MMT:  Next tx session  SHOULDER SPECIAL TESTS: Limited R shoulder ROM limits testing  JOINT MOBILITY TESTING:  Limited  PALPATION:  (+) R posterior deltoid tenderness with palpation.    Seated R shoulder flexion 3/5 MMT, abduction 3/5 MMT, extension 4/5 MMT.  Bicep 4+/5 MMT, tricep 4/5  MMT.  TODAY'S TREATMENT:  DATE: 08/03/22   SUBJECTIVE:  Pt. Reports no R shoulder/neck pain at rest prior to tx. Session.  Pt. Had X-ray for low back this morning (no results posted).      There.ex.:  B UBE 3 min. F/b.   Discussed f/u with ortho.     Standing L shoulder flexion AROM 10x.  PT assist for scapular control/ mirror feedback.  Standing shoulder abduction/ extension/ IR with wand.    R shoulder AROM: flexion (111 deg.), abduction (79 deg.), IR (55 deg.), ER (34 deg.)- pain, .  PROM: flexion (136 deg.), abduction (113 deg.).    Seated R shoulder flexion 3/5 MMT, abduction 3/5 MMT, extension 4+/5 MMT.  L shoulder flexion 4/5 MMT, abduction 4/5 MMT.  Standing bicep 5/5 MMT (improvement), tricep 4/5 MMT.    No Nautilus today.       Supine R shoulder AROM (horizontal abduction and adduction/ serratus punches 3#/ IR 3#/ ER 3#) 20x.  Light OP as tolerated.    20# box lifting/ carrying in gym.  Focus on proper technique/ posture.  Sled push/ pull 20# in gym.    Manual tx.: <5 min. (No charge).    Seated R shoulder PROM (all planes)- pain tolerable.     Pt. Using ice to hip at home and then R shoulder as needed.   PATIENT EDUCATION: Education details: Discussed posture/ lifting limitations Person educated: Patient Education method: Explanation Education comprehension: verbalized understanding  HOME EXERCISE PROGRAM: Pulley ex. (Flexion/ abduction)- pain-free range   ASSESSMENT:  CLINICAL IMPRESSION: Pt. presents with R shoulder ROM/ strength limitations.  No c/o neck pain t/o tx. Session.   Pt. Has no R shoulder pain at rest but marked increase in pain with active shoulder flexion/ reaching >90 deg.  See updated shoulder ROM/ goals.  Marked improvement in FOTO score.   Limited with chest press while using bar due to R shoulder limitations/  weakness.   Pt. Will continue with current HEP and use of pulley ex in a pain tolerable range.   Pt. Will benefit from skilled PT services to develop HEP to improve pain-free mobility.    OBJECTIVE IMPAIRMENTS: decreased activity tolerance, decreased mobility, decreased ROM, decreased strength, hypomobility, impaired flexibility, improper body mechanics, postural dysfunction, and pain.   ACTIVITY LIMITATIONS: carrying, lifting, standing, reach over head, and hygiene/grooming  PARTICIPATION LIMITATIONS: interpersonal relationship and community activity  PERSONAL FACTORS: Age and Past/current experiences are also affecting patient's functional outcome.   REHAB POTENTIAL: Good  CLINICAL DECISION MAKING: Evolving/moderate complexity  EVALUATION COMPLEXITY: Moderate   GOALS: Goals reviewed with patient? Yes  SHORT TERM GOALS: Target date: 07/20/22  Pt. Independent with HEP to increase cervical AROM to [facility] to improve painfree mobility.  Baseline: See above Goal status:  Goal met   LONG TERM GOALS: Target date: 08/03/22  Pt. Will increase FOTO to 52 to improve pain-free mobility.  Baseline: initial FOTO 13.  1/9: 65 Goal status: Goal met  2.  Pt. Will report <4/10 R shoulder pain with forward reaching and carrying <20# objects to improve household tasks.  Baseline:  7/10 pain with R shoulder use.  1/9: 5/10- pt. Reports he is doing better.  Goal status: Partially met  3.  Pt. Will be able to complete 30 minutes of exercise at gym with no increase c/o neck/ R shoulder pain.   Baseline:  pain limited Goal status: Ongoing   PLAN:  PT FREQUENCY: 2x/week  PT DURATION: 4 weeks  PLANNED INTERVENTIONS: Therapeutic exercises, Therapeutic activity,  Neuromuscular re-education, Patient/Family education, Self Care, Joint mobilization, Dry Needling, Electrical stimulation, Cryotherapy, Moist heat, Manual therapy, and Re-evaluation  PLAN FOR NEXT SESSION:  Progress strengthening.     Pura Spice, PT, DPT # [phone] 08/03/2022, 12:30 PM

## 2022-08-04 ENCOUNTER — Ambulatory Visit (INDEPENDENT_AMBULATORY_CARE_PROVIDER_SITE_OTHER): Payer: PPO | Admitting: Neurosurgery

## 2022-08-04 ENCOUNTER — Other Ambulatory Visit: Payer: Self-pay

## 2022-08-04 DIAGNOSIS — M4316 Spondylolisthesis, lumbar region: Secondary | ICD-10-CM

## 2022-08-04 DIAGNOSIS — Z09 Encounter for follow-up examination after completed treatment for conditions other than malignant neoplasm: Secondary | ICD-10-CM

## 2022-08-04 DIAGNOSIS — M25511 Pain in right shoulder: Secondary | ICD-10-CM

## 2022-08-04 DIAGNOSIS — G8929 Other chronic pain: Secondary | ICD-10-CM

## 2022-08-04 NOTE — Progress Notes (Signed)
   REFERRING PHYSICIAN:  Andee Poles [place],  [place] [id number]  DOS: 05/14/22  L4-5 TLIF, R L5/S1 decompression   HISTORY OF PRESENT ILLNESS: Isaac Weber is status post L4-5 TLIF, R L5/S1 decompression.    His pain is much better than it was prior to surgery.  He is having some stiffness.  He is doing much better than prior to surgery.    PHYSICAL EXAMINATION:  Telephone visit  ROS (Neurologic):  Negative except as noted above  IMAGING: No complications noted  ASSESSMENT/PLAN:  KRISTINA BERTONE is doing very well s/p above surgery.  I am very pleased with his recovery from low back surgery.   I will see him back in 6 months with x-rays.    I will refer him to Dr. Roland Rack for his shoulder.  This visit was performed via telephone.  Patient location: home Provider location: office  I spent a total of 5 minutes non-face-to-face activities for this visit on the date of this encounter including review of current clinical condition and response to treatment.   Meade Maw MD Department of neurosurgery

## 2022-08-05 ENCOUNTER — Ambulatory Visit: Payer: PPO | Admitting: Physical Therapy

## 2022-08-05 DIAGNOSIS — M6281 Muscle weakness (generalized): Secondary | ICD-10-CM

## 2022-08-05 DIAGNOSIS — M249 Joint derangement, unspecified: Secondary | ICD-10-CM

## 2022-08-05 DIAGNOSIS — M25511 Pain in right shoulder: Secondary | ICD-10-CM | POA: Diagnosis not present

## 2022-08-05 DIAGNOSIS — G8929 Other chronic pain: Secondary | ICD-10-CM

## 2022-08-05 DIAGNOSIS — M25611 Stiffness of right shoulder, not elsewhere classified: Secondary | ICD-10-CM

## 2022-08-05 DIAGNOSIS — M542 Cervicalgia: Secondary | ICD-10-CM

## 2022-08-05 DIAGNOSIS — M436 Torticollis: Secondary | ICD-10-CM

## 2022-08-05 NOTE — Therapy (Signed)
OUTPATIENT PHYSICAL THERAPY SHOULDER TREATMENT/RECERTIFICATION Physical Therapy Progress Note   Dates of reporting period  07/06/22  to  08/05/22  Patient Name: Isaac BERNHARD MRN: [id number] DOB:1946/06/19, 77 y.o., male Today's Date: 08/06/2022  END OF SESSION:  PT End of Session - 08/05/22 1426     Visit Number 10    Number of Visits 18    Date for PT Re-Evaluation 09/02/22    PT Start Time 1426    PT Stop Time 1520    PT Time Calculation (min) 54 min             Past Medical History:  Diagnosis Date   Abnormal chest CT    Actinic keratosis    Allergic rhinitis, unspecified    Anemia    Aortic atherosclerosis (HCC)    Arthritis    BPH (benign prostatic hyperplasia)    COPD (chronic obstructive pulmonary disease) ([place])    Coronary artery disease    Cough variant asthma    Diabetes mellitus without complication (HCC)    Glaucoma    Hypertension    Other chronic pain    Pancreatic lesion    Pneumonia    Pure hypercholesterolemia, unspecified    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 WD SCC, ulcerated. [facility] 04/29/2020   Past Surgical History:  Procedure Laterality Date   APPLICATION OF INTRAOPERATIVE CT SCAN N/A 05/12/2022   Procedure: APPLICATION OF INTRAOPERATIVE CT SCAN;  Surgeon: Meade Maw, MD;  Location: [facility] ORS;  Service: Neurosurgery;  Laterality: N/A;   BACK SURGERY     BRONCHIAL WASHINGS  11/26/2021   Procedure: BRONCHIAL WASHINGS;  Surgeon: Brand Males, MD;  Location: [facility];  Service: Endoscopy;;   BUNIONECTOMY Left 2003   hammer toe as well   BUNIONECTOMY WITH HAMMERTOE RECONSTRUCTION Left 2013   repeat   CERVICAL DISC SURGERY      COLONOSCOPY WITH PROPOFOL N/A 04/30/2021   Procedure: COLONOSCOPY WITH PROPOFOL;  Surgeon: Jonathon Bellows, MD;  Location[facility];  Service: Gastroenterology;  Laterality: N/A;   GREEN LIGHT LASER TURP (TRANSURETHRAL RESECTION OF PROSTATE  2010   laser, shrink prostate   KNEE ARTHROSCOPY Right 1991   LEG SURGERY Left    BENIGN BONE TUMOR   LUMBAR [place] SURGERY  2010   discectomy   LUMBAR DISC SURGERY  2011   LUMBAR LAMINECTOMY/DECOMPRESSION MICRODISCECTOMY Right 05/12/2022   Procedure: RIGHT L5-S1 DISCECTOMY;  Surgeon: Meade Maw, MD;  Location: [facility] ORS;  Service: Neurosurgery;  Laterality: Right;   PROSTATE SURGERY  2002   shrink prostate   ROTATOR CUFF REPAIR Left 1999   debride, remove bonespur   ROTATOR CUFF REPAIR Right 1996   TONSILLECTOMY     TRANSFORAMINAL LUMBAR INTERBODY FUSION W/ MIS 1 LEVEL Right 05/12/2022   Procedure: RIGHT L4-5 MINIMALLY INVASIVE (MIS) TRANSFORAMINAL LUMBAR INTERBODY FUSION (TLIF);  Surgeon: Meade Maw, MD;  Location: [facility] ORS;  Service: Neurosurgery;  Laterality: Right;   VIDEO BRONCHOSCOPY N/A 11/26/2021   Procedure: VIDEO BRONCHOSCOPY WITHOUT FLUORO;  Surgeon: Brand Males, MD;  Location: [facility] ENDOSCOPY;  Service: Endoscopy;  Laterality: N/A;  for chroni ccough BAL   Patient Active Problem List   Diagnosis Date Noted  Spondylolisthesis at L4-L5 level 05/12/2022   Chronic right-sided low back pain with right-sided sciatica 05/12/2022   Spondylolisthesis of lumbar region 05/12/2022   Other chronic pain    Pre-operative respiratory examination 04/26/2022   Cervical facet joint syndrome 03/09/2022   Osteoarthritis of glenohumeral joint, right 03/02/2022   Cervical spondylosis with radiculopathy 03/02/2022   Lower extremity edema 01/18/2022   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 11/17/2021   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) 11/17/2021   Lumbar radiculopathy 10/27/2021   Failed back surgical  syndrome 10/27/2021   History of lumbar surgery 10/27/2021   Chronic pain syndrome 10/27/2021   Postnasal drip 10/06/2021   Derangement of right SI joint 09/28/2021   Spondylosis of lumbosacral region without myelopathy or radiculopathy 09/28/2021   Biceps tendinitis, right 08/26/2021   Supraspinatus syndrome, right 08/26/2021   Deviated septum 07/28/2021   History of chronic sinusitis 07/28/2021   Chronic pansinusitis 05/21/2021   Rhinitis 02/04/2021   Abnormal findings on diagnostic imaging of lung 07/31/2020   Chronic cough 07/31/2020   Centrilobular emphysema ([place]) 07/31/2020   Healthcare maintenance 07/31/2020   Type 2 diabetes mellitus with other specified complication ([place]) 41/32/4401   Kidney cysts 01/11/2020   Pancreatic cyst 01/11/2020   Iron deficiency anemia 01/11/2020   Hyperlipidemia associated with type 2 diabetes mellitus ([place]) 01/11/2020   Primary osteoarthritis involving multiple joints 01/11/2020   BPH associated with nocturia 01/11/2020   Cough variant asthma 08/17/2019   Essential hypertension     PCP: Olin Hauser, DO  REFERRING PROVIDER: Meade Maw, MD  REFERRING DIAG: Chronic R shoulder pain/ Cervical radiculitis  THERAPY DIAG:  Chronic right shoulder pain  Shoulder joint stiffness, right  Muscle weakness (generalized)  Cervicalgia  Neck stiffness  Derangement of right SI joint  Rationale for Evaluation and Treatment: Rehabilitation  ONSET DATE: Chronic  SUBJECTIVE:                                                                                                                                                                                      SUBJECTIVE STATEMENT:  EVALUATION Pt. Reports 7/10 R shoulder pain at worst with reaching and 0/10 at best/ rest.  Pt. Has a 25# lifting restriction secondary to low back surgery (see MD notes).  Pt. Sees Tim Beshal [facility]) for back/ hip adjustments.   PERTINENT HISTORY: See MD  notes.  Recert lumbar surgery/ previous cervical surgery.    PAIN:  Are you having pain? Yes: NPRS scale: 7/10 Pain location: R shoulder Pain description: sharp Aggravating factors: reaching Relieving factors: rest  PRECAUTIONS: Back  WEIGHT BEARING RESTRICTIONS: No  FALLS:  Has  patient fallen in last 6 months? No  LIVING ENVIRONMENT: Lives with: lives with their spouse Lives in: House/apartment  OCCUPATION: Retired.  Pt. Known to PT clinic/ see previous notes  PLOF: Independent  PATIENT GOALS: Decrease neck/ R shoulder pain with daily tasks.   NEXT MD VISIT: 08/03/22 with Dr. Izora Ribas  OBJECTIVE:   DIAGNOSTIC FINDINGS:  Narrative & Impression  CLINICAL DATA:  Neck pain, chronic, degenerative changes on xray. Prior C-spine fusion, now having severe right neck and shoulder pain, concern for C4/5 radiculopathy.   EXAM: MRI CERVICAL SPINE WITHOUT AND WITH CONTRAST   TECHNIQUE: Multiplanar and multiecho pulse sequences of the cervical spine, to include the craniocervical junction and cervicothoracic junction, were obtained without and with intravenous contrast.   CONTRAST:  22m GADAVIST GADOBUTROL 1 MMOL/ML IV SOLN   COMPARISON:  Cervical spine radiographs 03/02/2022   FINDINGS: Alignment: Minimal anterolisthesis of C3 on C4, C4 on C5, and C5 on C6.   Vertebrae: No fracture, suspicious marrow lesion, or significant marrow edema. Solid interbody osseous fusion at C5-6 and C6-7.   Cord: Normal cord signal and morphology.  No abnormal enhancement.   Posterior Fossa, vertebral arteries, paraspinal tissues: Unremarkable.   Disc levels:   C2-3: Minimal disc bulging, uncovertebral spurring, and moderate to severe right and mild left facet arthrosis without significant stenosis.   C3-4: Mild disc space narrowing. Anterolisthesis with bulging uncovered disc, uncovertebral spurring, and severe facet arthrosis result in mild spinal stenosis and moderate bilateral  neural foraminal stenosis.   C4-5: Mild disc space narrowing. Anterolisthesis with bulging uncovered disc, asymmetric right uncovertebral spurring, and severe right and moderate left facet arthrosis result in mild spinal stenosis and moderate right neural foraminal stenosis.   C5-6: Anterior fusion.  No stenosis.   C6-7: Anterior fusion.  No stenosis.   C7-T1: Moderate disc space narrowing. Disc bulging, uncovertebral spurring, and mild-to-moderate facet arthrosis result in mild-to-moderate right neural foraminal stenosis without spinal stenosis.   IMPRESSION: 1. Solid anterior fusion at C5-6 and C6-7 without residual stenosis. 2. Severe upper cervical facet arthrosis with mild spinal stenosis and moderate neural foraminal stenosis at C3-4 and C4-5. 3. Mild-to-moderate right neural foraminal stenosis at C7-T1.      PATIENT SURVEYS:  FOTO initial 13/ goal 563 COGNITION: Overall cognitive status: Within functional limits for tasks assessed     SENSATION: WFL  POSTURE: Rounded shoulder/ forward head posture.  UPPER EXTREMITY ROM:   Active ROM Right eval Left eval  Shoulder flexion 72 deg. 152 deg.  Shoulder extension    Shoulder abduction 68 deg. 146 deg.  Shoulder adduction    Shoulder internal rotation 50 deg. 70 deg.  Shoulder external rotation 50 deg. 74 deg.  Elbow flexion WNL WNL  Elbow extension WNL WNL  Wrist flexion WNL WNL  Wrist extension WNL WNL  Wrist ulnar deviation    Wrist radial deviation    Wrist pronation    Wrist supination    (Blank rows = not tested)  R shoulder flexion 138 deg. Passive.  Cervical AROM: flexion 25 deg./ extension 60 deg./ L rotn. 64 deg./ R rotn. 64 deg./ L lat. Flex. 24 deg./ R lat. Flex. 32 deg.    UPPER EXTREMITY MMT:  Next tx session  SHOULDER SPECIAL TESTS: Limited R shoulder ROM limits testing  JOINT MOBILITY TESTING:  Limited  PALPATION:  (+) R posterior deltoid tenderness with palpation.    Seated R  shoulder flexion 3/5 MMT, abduction 3/5 MMT, extension 4/5 MMT.  Bicep 4+/5 MMT, tricep 4/5 MMT.  TODAY'S TREATMENT:                                                                                                                                         DATE: 08/05/22   SUBJECTIVE:  Pt. Reports no R shoulder/neck pain at rest prior to tx. Session. Pt. Reports pain of 6/10 during HEP but had immediate relief with rest. Pt. Reported he was cleared by his neurosurgeon for activity/ golf in moderation.   Pt. Referred to Dr. Roland Rack for shoulder assessment/ discuss POC.    There.ex.:  B UBE 3.5 min. F/b.  (Discussed MD appt).    Standing L shoulder flexion AROM 10x.  PT assist for scapular control/ mirror feedback.    R shoulder AROM in supine: flexion (118 deg.), abduction (90 deg.) (with pain, less with abduction), IR (56 deg.), ER (41 deg.)- pain(6/10), .  PROM: flexion (137 deg.), abduction (115 deg.).   Marland Kitchen   Nautilus: seated 50# lat. Pull down 20x/ standing 30# tricep extension 20x// 50# scapular retraction  15x2.           Supine R shoulder AROM (horizontal abduction and adduction/ serratus punches 3#/ IR 3#/ ER 3#) 20x. Some added elbow support to encourage correct form. .     Golf swing practice outside on grassy terrain. Pt. Cued/modified to decrease follow through swing in order to decrease pain with activity.   Manual tx.: Trigger point work <5 min. (No charge).       PATIENT EDUCATION: Education details: Discussed posture/ lifting limitations Person educated: Patient Education method: Explanation Education comprehension: verbalized understanding  HOME EXERCISE PROGRAM: Pulley ex. (Flexion/ abduction)- pain-free range   ASSESSMENT:  CLINICAL IMPRESSION: Pt. presents with R shoulder ROM/ strength limitations.  No c/o neck pain t/o tx. Session.   Pt. Has no R shoulder pain at rest but marked increase in pain with active shoulder flexion/ reaching >90 deg.  See updated shoulder  ROM/ goals.   Pt. Will continue with current HEP and use of pulley ex in a pain tolerable range.   Pt. Desires to get back to the golf course and PT has adjusted treatment to help include specific pt. goal. Pt. Will benefit from skilled PT services to develop HEP and to improve pain-free functional mobility.   OBJECTIVE IMPAIRMENTS: decreased activity tolerance, decreased mobility, decreased ROM, decreased strength, hypomobility, impaired flexibility, improper body mechanics, postural dysfunction, and pain.   ACTIVITY LIMITATIONS: carrying, lifting, standing, reach over head, and hygiene/grooming  PARTICIPATION LIMITATIONS: interpersonal relationship and community activity  PERSONAL FACTORS: Age and Past/current experiences are also affecting patient's functional outcome.   REHAB POTENTIAL: Good  CLINICAL DECISION MAKING: Evolving/moderate complexity  EVALUATION COMPLEXITY: Moderate   GOALS: Goals reviewed with patient? Yes  SHORT TERM GOALS: Target date: 07/20/22  Pt. Independent with HEP to increase cervical AROM to [facility] to improve  painfree mobility.  Baseline: See above Goal status:  Goal met   LONG TERM GOALS: Target date: 09/02/22  Pt. Will increase FOTO to 52 to improve pain-free mobility.  Baseline: initial FOTO 13.  1/9: 65 Goal status: Goal met  2.  Pt. Will report <4/10 R shoulder pain with forward reaching and carrying <20# objects to improve household tasks.  Baseline:  7/10 pain with R shoulder use.  1/9: 5/10- pt. Reports he is doing better.  Goal status: Partially met  3.  Pt. Will be able to complete 30 minutes of exercise at gym with no increase c/o neck/ R shoulder pain.   Baseline:  pain limited Goal status: Ongoing   PLAN:  PT FREQUENCY: 2x/week  PT DURATION: 4 weeks  PLANNED INTERVENTIONS: Therapeutic exercises, Therapeutic activity, Neuromuscular re-education, Patient/Family education, Self Care, Joint mobilization, Dry Needling, Electrical  stimulation, Cryotherapy, Moist heat, Manual therapy, and Re-evaluation  PLAN FOR NEXT SESSION:  Progress strengthening.    Pura Spice, PT, DPT # [id number] 08/06/2022, 6:51 PM

## 2022-08-06 ENCOUNTER — Other Ambulatory Visit: Payer: Self-pay | Admitting: Neurosurgery

## 2022-08-10 ENCOUNTER — Other Ambulatory Visit: Payer: Self-pay | Admitting: Family Medicine

## 2022-08-10 ENCOUNTER — Ambulatory Visit: Payer: PPO | Admitting: Physical Therapy

## 2022-08-10 ENCOUNTER — Other Ambulatory Visit: Payer: Self-pay | Admitting: Urology

## 2022-08-10 ENCOUNTER — Encounter: Payer: Self-pay | Admitting: Physical Therapy

## 2022-08-10 ENCOUNTER — Other Ambulatory Visit (HOSPITAL_COMMUNITY): Payer: Self-pay

## 2022-08-10 DIAGNOSIS — M25611 Stiffness of right shoulder, not elsewhere classified: Secondary | ICD-10-CM

## 2022-08-10 DIAGNOSIS — M25511 Pain in right shoulder: Secondary | ICD-10-CM | POA: Diagnosis not present

## 2022-08-10 DIAGNOSIS — M6281 Muscle weakness (generalized): Secondary | ICD-10-CM

## 2022-08-10 DIAGNOSIS — I10 Essential (primary) hypertension: Secondary | ICD-10-CM

## 2022-08-10 DIAGNOSIS — G8929 Other chronic pain: Secondary | ICD-10-CM

## 2022-08-10 DIAGNOSIS — E1169 Type 2 diabetes mellitus with other specified complication: Secondary | ICD-10-CM

## 2022-08-10 MED ORDER — TAMSULOSIN HCL 0.4 MG PO CAPS
0.4000 mg | ORAL_CAPSULE | Freq: Every day | ORAL | 1 refills | Status: DC
  Filled 2022-08-10: qty 90, 90d supply, fill #0
  Filled 2022-11-20: qty 90, 90d supply, fill #1

## 2022-08-10 NOTE — Therapy (Signed)
OUTPATIENT PHYSICAL THERAPY SHOULDER TREATMENT  Patient Name: Isaac Weber MRN: [id number] DOB:[birth year], 77 y.o., male Today's Date: 08/10/2022  END OF SESSION:  PT End of Session - 08/10/22 1423     Visit Number 11    Number of Visits 18    Date for PT Re-Evaluation 09/02/22    PT Start Time 43    PT Stop Time 9201    PT Time Calculation (min) 54 min             Past Medical History:  Diagnosis Date   Abnormal chest CT    Actinic keratosis    Allergic rhinitis, unspecified    Anemia    Aortic atherosclerosis ([place])    Arthritis    BPH (benign prostatic hyperplasia)    COPD (chronic obstructive pulmonary disease) ([place])    Coronary artery disease    Cough variant asthma    Diabetes mellitus without complication (HCC)    Glaucoma    Hypertension    Other chronic pain    Pancreatic lesion    Pneumonia    Pure hypercholesterolemia, unspecified    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 WD SCC, ulcerated. [facility] 04/29/2020   Past Surgical History:  Procedure Laterality Date   APPLICATION OF INTRAOPERATIVE CT SCAN N/A 05/12/2022   Procedure: APPLICATION OF INTRAOPERATIVE CT SCAN;  Surgeon: Meade Maw, MD;  Location: [facility] ORS;  Service: Neurosurgery;  Laterality: N/A;   BACK SURGERY     BRONCHIAL WASHINGS  11/26/2021   Procedure: BRONCHIAL WASHINGS;  Surgeon: Brand Males, MD;  Location: [facility];  Service: Endoscopy;;   BUNIONECTOMY Left 2003   hammer toe as well   BUNIONECTOMY WITH HAMMERTOE RECONSTRUCTION Left 2013   repeat   CERVICAL DISC SURGERY     COLONOSCOPY WITH PROPOFOL N/A 04/30/2021   Procedure: COLONOSCOPY WITH PROPOFOL;  Surgeon: Jonathon Bellows, MD;   Location: [facility] ENDOSCOPY;  Service: Gastroenterology;  Laterality: N/A;   GREEN LIGHT LASER TURP (TRANSURETHRAL RESECTION OF PROSTATE  2010   laser, shrink prostate   KNEE ARTHROSCOPY Right 1991   LEG SURGERY Left    BENIGN BONE TUMOR   LUMBAR [place] SURGERY  2010   discectomy   LUMBAR DISC SURGERY  2011   LUMBAR LAMINECTOMY/DECOMPRESSION MICRODISCECTOMY Right 05/12/2022   Procedure: RIGHT L5-S1 DISCECTOMY;  Surgeon: Meade Maw, MD;  Location: [facility] ORS;  Service: Neurosurgery;  Laterality: Right;   PROSTATE SURGERY  2002   shrink prostate   ROTATOR CUFF REPAIR Left 1999   debride, remove bonespur   ROTATOR CUFF REPAIR Right 1996   TONSILLECTOMY     TRANSFORAMINAL LUMBAR INTERBODY FUSION W/ MIS 1 LEVEL Right 05/12/2022   Procedure: RIGHT L4-5 MINIMALLY INVASIVE (MIS) TRANSFORAMINAL LUMBAR INTERBODY FUSION (TLIF);  Surgeon: Meade Maw, MD;  Location: [facility] ORS;  Service: Neurosurgery;  Laterality: Right;   VIDEO BRONCHOSCOPY N/A 11/26/2021   Procedure: VIDEO BRONCHOSCOPY WITHOUT FLUORO;  Surgeon: Brand Males, MD;  Location: [facility] END[facility];  Service: Endoscopy;  Laterality: N/A;  for chroni ccough BAL   Patient Active Problem List   Diagnosis Date Noted   Spondylolisthesis at L4-L5 level 05/12/2022   Chronic right-sided low back pain with right-sided sciatica  05/12/2022   Spondylolisthesis of lumbar region 05/12/2022   Other chronic pain    Pre-operative respiratory examination 04/26/2022   Cervical facet joint syndrome 03/09/2022   Osteoarthritis of glenohumeral joint, right 03/02/2022   Cervical spondylosis with radiculopathy 03/02/2022   Lower extremity edema 01/18/2022   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 11/17/2021   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) 11/17/2021   Lumbar radiculopathy 10/27/2021   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   History of lumbar surgery 10/27/2021   Chronic pain syndrome 10/27/2021   Postnasal  drip 10/06/2021   Derangement of right SI joint 09/28/2021   Spondylosis of lumbosacral region without myelopathy or radiculopathy 09/28/2021   Biceps tendinitis, right 08/26/2021   Supraspinatus syndrome, right 08/26/2021   Deviated septum 07/28/2021   History of chronic sinusitis 07/28/2021   Chronic pansinusitis 05/21/2021   Rhinitis 02/04/2021   Abnormal findings on diagnostic imaging of lung 07/31/2020   Chronic cough 07/31/2020   Centrilobular emphysema ([place]) 07/31/2020   Healthcare maintenance 07/31/2020   Type 2 diabetes mellitus with other specified complication ([place]) 41/66/0630   Kidney cysts 01/11/2020   Pancreatic cyst 01/11/2020   Iron deficiency anemia 01/11/2020   Hyperlipidemia associated with type 2 diabetes mellitus ([place]) 01/11/2020   Primary osteoarthritis involving multiple joints 01/11/2020   BPH associated with nocturia 01/11/2020   Cough variant asthma 08/17/2019   Essential hypertension     PCP: Olin Hauser, DO  REFERRING PROVIDER: Meade Maw, MD  REFERRING DIAG: Chronic R shoulder pain/ Cervical radiculitis  THERAPY DIAG:  Chronic right shoulder pain  Shoulder joint stiffness, right  Muscle weakness (generalized)  Rationale for Evaluation and Treatment: Rehabilitation  ONSET DATE: Chronic  SUBJECTIVE:                                                                                                                                                                                      SUBJECTIVE STATEMENT:  EVALUATION Pt. Reports 7/10 R shoulder pain at worst with reaching and 0/10 at best/ rest.  Pt. Has a 25# lifting restriction secondary to low back surgery (see MD notes).  Pt. Sees Tim Beshal [facility]) for back/ hip adjustments.   PERTINENT HISTORY: See MD notes.  Recert lumbar surgery/ previous cervical surgery.    PAIN:  Are you having pain? Yes: NPRS scale: 7/10 Pain location: R shoulder Pain description:  sharp Aggravating factors: reaching Relieving factors: rest  PRECAUTIONS: Back  WEIGHT BEARING RESTRICTIONS: No  FALLS:  Has patient fallen in last 6 months? No  LIVING ENVIRONMENT: Lives with: lives with their spouse Lives in: House/apartment  OCCUPATION: Retired.  Pt. Known to  PT clinic/ see previous notes  PLOF: Independent  PATIENT GOALS: Decrease neck/ R shoulder pain with daily tasks.   NEXT MD VISIT: 08/03/22 with Dr. Izora Ribas  OBJECTIVE:   DIAGNOSTIC FINDINGS:  Narrative & Impression  CLINICAL DATA:  Neck pain, chronic, degenerative changes on xray. Prior C-spine fusion, now having severe right neck and shoulder pain, concern for C4/5 radiculopathy.   EXAM: MRI CERVICAL SPINE WITHOUT AND WITH CONTRAST   TECHNIQUE: Multiplanar and multiecho pulse sequences of the cervical spine, to include the craniocervical junction and cervicothoracic junction, were obtained without and with intravenous contrast.   CONTRAST:  2m GADAVIST GADOBUTROL 1 MMOL/ML IV SOLN   COMPARISON:  Cervical spine radiographs 03/02/2022   FINDINGS: Alignment: Minimal anterolisthesis of C3 on C4, C4 on C5, and C5 on C6.   Vertebrae: No fracture, suspicious marrow lesion, or significant marrow edema. Solid interbody osseous fusion at C5-6 and C6-7.   Cord: Normal cord signal and morphology.  No abnormal enhancement.   Posterior Fossa, vertebral arteries, paraspinal tissues: Unremarkable.   Disc levels:   C2-3: Minimal disc bulging, uncovertebral spurring, and moderate to severe right and mild left facet arthrosis without significant stenosis.   C3-4: Mild disc space narrowing. Anterolisthesis with bulging uncovered disc, uncovertebral spurring, and severe facet arthrosis result in mild spinal stenosis and moderate bilateral neural foraminal stenosis.   C4-5: Mild disc space narrowing. Anterolisthesis with bulging uncovered disc, asymmetric right uncovertebral spurring, and  severe right and moderate left facet arthrosis result in mild spinal stenosis and moderate right neural foraminal stenosis.   C5-6: Anterior fusion.  No stenosis.   C6-7: Anterior fusion.  No stenosis.   C7-T1: Moderate disc space narrowing. Disc bulging, uncovertebral spurring, and mild-to-moderate facet arthrosis result in mild-to-moderate right neural foraminal stenosis without spinal stenosis.   IMPRESSION: 1. Solid anterior fusion at C5-6 and C6-7 without residual stenosis. 2. Severe upper cervical facet arthrosis with mild spinal stenosis and moderate neural foraminal stenosis at C3-4 and C4-5. 3. Mild-to-moderate right neural foraminal stenosis at C7-T1.      PATIENT SURVEYS:  FOTO initial 13/ goal 531 COGNITION: Overall cognitive status: Within functional limits for tasks assessed     SENSATION: WFL  POSTURE: Rounded shoulder/ forward head posture.  UPPER EXTREMITY ROM:   Active ROM Right eval Left eval  Shoulder flexion 72 deg. 152 deg.  Shoulder extension    Shoulder abduction 68 deg. 146 deg.  Shoulder adduction    Shoulder internal rotation 50 deg. 70 deg.  Shoulder external rotation 50 deg. 74 deg.  Elbow flexion WNL WNL  Elbow extension WNL WNL  Wrist flexion WNL WNL  Wrist extension WNL WNL  Wrist ulnar deviation    Wrist radial deviation    Wrist pronation    Wrist supination    (Blank rows = not tested)  R shoulder flexion 138 deg. Passive.  Cervical AROM: flexion 25 deg./ extension 60 deg./ L rotn. 64 deg./ R rotn. 64 deg./ L lat. Flex. 24 deg./ R lat. Flex. 32 deg.    UPPER EXTREMITY MMT:  Next tx session  SHOULDER SPECIAL TESTS: Limited R shoulder ROM limits testing  JOINT MOBILITY TESTING:  Limited  PALPATION:  (+) R posterior deltoid tenderness with palpation.    Seated R shoulder flexion 3/5 MMT, abduction 3/5 MMT, extension 4/5 MMT.  Bicep 4+/5 MMT, tricep 4/5 MMT.  1/11:  R shoulder AROM in supine: flexion (118 deg.),  abduction (90 deg.) (with pain, less with abduction), IR (  56 deg.), ER (41 deg.)- pain(6/10), PROM: flexion (137 deg.), abduction (115 deg.).    TODAY'S TREATMENT:                                                                                                                                         DATE: 08/10/22  SUBJECTIVE:  Pt. Reports no R shoulder/neck pain at rest prior to tx. Session.  Pt. Demonstrates reaching R shoulder overhead to put hat on coat hanger in PT gym.  Pt. Able to reach higher in shower with R UE to get shampoo. No appt. With Dr. Roland Rack at this time.    There.ex.:  B UBE 3.5 min. F/b.  (Discussed weekend activities)    Standing B shoulder flexion/ abduction/ extension/ IR AAROM  with wand 10x each.  PT assist for scapular control/ mirror feedback.    Nautilus: seated 50# lat. Pull down 15x2/ standing 30# tricep extension 15x2/ 50# scapular retraction 15x2.           Supine R and L shoulder AROM (horizontal abduction and adduction/ 4# chest press with added serratus punch 3#/ IR 3#/ ER 3#) 2x15. Some verbal and tactile cuing to encourage correct form on R UE.     Reassessment of R shoulder AA/PROM all planes in supine position.    No Golf swing secondary to rainy weather.    Manual tx.: Trigger point work <5 min. (No charge).     PATIENT EDUCATION: Education details: Discussed posture/ lifting limitations Person educated: Patient Education method: Explanation Education comprehension: verbalized understanding  HOME EXERCISE PROGRAM: Pulley ex. (Flexion/ abduction)- pain-free range   ASSESSMENT:  CLINICAL IMPRESSION: Pt. presents with R shoulder ROM/ strength limitations.  No c/o neck pain t/o tx. Session.   Pt. Has no R shoulder pain at rest but marked increase in pain with active shoulder flexion/ reaching >90 deg.  Pt. Will continue with current HEP and use of pulley ex in a pain tolerable range.   Pt. Desires to get back to the golf course and hoping to have  appt. With Dr. Roland Rack scheduled soon.  Pt. Will benefit from skilled PT services to develop HEP and to improve pain-free functional mobility.   OBJECTIVE IMPAIRMENTS: decreased activity tolerance, decreased mobility, decreased ROM, decreased strength, hypomobility, impaired flexibility, improper body mechanics, postural dysfunction, and pain.   ACTIVITY LIMITATIONS: carrying, lifting, standing, reach over head, and hygiene/grooming  PARTICIPATION LIMITATIONS: interpersonal relationship and community activity  PERSONAL FACTORS: Age and Past/current experiences are also affecting patient's functional outcome.   REHAB POTENTIAL: Good  CLINICAL DECISION MAKING: Evolving/moderate complexity  EVALUATION COMPLEXITY: Moderate   GOALS: Goals reviewed with patient? Yes  SHORT TERM GOALS: Target date: 07/20/22  Pt. Independent with HEP to increase cervical AROM to [facility] to improve painfree mobility.  Baseline: See above Goal status:  Goal met   LONG TERM GOALS: Target date: 09/02/22  Pt. Will  increase FOTO to 52 to improve pain-free mobility.  Baseline: initial FOTO 13.  1/9: 65 Goal status: Goal met  2.  Pt. Will report <4/10 R shoulder pain with forward reaching and carrying <20# objects to improve household tasks.  Baseline:  7/10 pain with R shoulder use.  1/9: 5/10- pt. Reports he is doing better.  Goal status: Partially met  3.  Pt. Will be able to complete 30 minutes of exercise at gym with no increase c/o neck/ R shoulder pain.   Baseline:  pain limited Goal status: Ongoing   PLAN:  PT FREQUENCY: 2x/week  PT DURATION: 4 weeks  PLANNED INTERVENTIONS: Therapeutic exercises, Therapeutic activity, Neuromuscular re-education, Patient/Family education, Self Care, Joint mobilization, Dry Needling, Electrical stimulation, Cryotherapy, Moist heat, Manual therapy, and Re-evaluation  PLAN FOR NEXT SESSION:  Progress strengthening.    Pura Spice, PT, DPT # [id number] 08/10/2022, 3:18  PM

## 2022-08-11 ENCOUNTER — Other Ambulatory Visit: Payer: Self-pay

## 2022-08-11 ENCOUNTER — Other Ambulatory Visit (HOSPITAL_COMMUNITY): Payer: Self-pay

## 2022-08-11 NOTE — Telephone Encounter (Signed)
Unable to refill per protocol, Rx request is too soon. Will refuse.  Requested Prescriptions  Pending Prescriptions Disp Refills   rosuvastatin (CRESTOR) 5 MG tablet 90 tablet 1    Sig: TAKE ONE TABLET BY MOUTH ONCE DAILY     Cardiovascular:  Antilipid - Statins 2 Failed - 08/10/2022  4:40 PM      Failed - Lipid Panel in normal range within the last 12 months    Cholesterol  Date Value Ref Range Status  06/21/2022 128 <200 mg/dL Final   LDL Cholesterol (Calc)  Date Value Ref Range Status  06/21/2022 56 mg/dL (calc) Final    Comment:    Reference range: <100 . Desirable range <100 mg/dL for primary prevention;   <70 mg/dL for patients with CHD or diabetic patients  with > or = 2 CHD risk factors. Marland Kitchen LDL-C is now calculated using the Martin-Hopkins  calculation, which is a validated novel method providing  better accuracy than the Friedewald equation in the  estimation of LDL-C.  Cresenciano Genre et al. Annamaria Helling. 9470;962(83): 2061-2068  (http://education.[employer].com/faq/FAQ164)    HDL  Date Value Ref Range Status  06/21/2022 58 > OR = 40 mg/dL Final   Triglycerides  Date Value Ref Range Status  06/21/2022 61 <150 mg/dL Final         Passed - Cr in normal range and within 360 days    Creatinine  Date Value Ref Range Status  09/16/2014 0.9 0.7 - 1.3 mg/dL Final   Creat  Date Value Ref Range Status  06/21/2022 0.73 0.70 - 1.28 mg/dL Final   Creatinine, Urine  Date Value Ref Range Status  06/24/2022 151 20 - 320 mg/dL Final         Passed - Patient is not pregnant      Passed - Valid encounter within last 12 months    Recent Outpatient Visits           1 month ago Annual physical exam   [place], DO   2 months ago Essential hypertension   [place], DO   4 months ago Acute rhinosinusitis   [place], DO   5 months ago Cervical  spondylosis with radiculopathy   [place] [facility], Earley Abide, MD   5 months ago Supraspinatus syndrome, right   [facility] at [facility], Earley Abide, MD       Future Appointments             In 1 week Ralene Bathe, MD [place]   In 3 weeks [place], Charlann Lange, MD [address] [facility] [place]. [facility], [facility]   In 4 months [place], Devonne Doughty, DO [facility], [facility]             losartan (COZAAR) 50 MG tablet 90 tablet 1    Sig: TAKE ONE TABLET BY MOUTH ONCE DAILY     Cardiovascular:  Angiotensin Receptor Blockers Passed - 08/10/2022  4:40 PM      Passed - Cr in normal range and within 180 days    Creatinine  Date Value Ref Range Status  09/16/2014 0.9 0.7 - 1.3 mg/dL Final   Creat  Date Value Ref Range Status  06/21/2022 0.73 0.70 - 1.28 mg/dL Final   Creatinine, Urine  Date Value Ref Range Status  06/24/2022 151 20 - 320 mg/dL Final         Passed - K in normal range and within 180 days    Potassium  Date Value Ref Range Status  06/21/2022 4.0 3.5 - 5.3 mmol/L Final  09/16/2014 3.7 3.5 - 5.1 mEq/L Final         Passed - Patient is not pregnant      Passed - Last BP in normal range    BP Readings from Last 1 Encounters:  06/24/22 124/68         Passed - Valid encounter within last 6 months    Recent Outpatient Visits           1 month ago Annual physical exam   [place], DO   2 months ago Essential hypertension   [place], DO   4 months ago Acute rhinosinusitis   [place], DO   5 months ago Cervical spondylosis with radiculopathy    [facility] Medicine at [facility], Earley Abide, MD   5 months ago Supraspinatus  syndrome, right   [facility] at [facility], Earley Abide, MD       Future Appointments             In 1 week Ralene Bathe, MD [place]   In 3 weeks [place], Charlann Lange, MD [address] [facility] Of [place]. [facility], [facility]   In 4 months [place], Devonne Doughty, [facility], [facility]

## 2022-08-12 ENCOUNTER — Ambulatory Visit: Payer: PPO | Admitting: Physical Therapy

## 2022-08-12 ENCOUNTER — Encounter: Payer: Self-pay | Admitting: Neurosurgery

## 2022-08-12 DIAGNOSIS — M25611 Stiffness of right shoulder, not elsewhere classified: Secondary | ICD-10-CM

## 2022-08-12 DIAGNOSIS — M25511 Pain in right shoulder: Secondary | ICD-10-CM | POA: Diagnosis not present

## 2022-08-12 DIAGNOSIS — M6281 Muscle weakness (generalized): Secondary | ICD-10-CM

## 2022-08-12 DIAGNOSIS — G8929 Other chronic pain: Secondary | ICD-10-CM

## 2022-08-12 NOTE — Therapy (Signed)
OUTPATIENT PHYSICAL THERAPY SHOULDER TREATMENT  Patient Name: ZEVEN KOCAK MRN: [id number] DOB:1946/02/15, 77 y.o., male Today's Date: 08/12/2022  END OF SESSION:  PT End of Session - 08/12/22 1443     Visit Number 12    Number of Visits 18    Date for PT Re-Evaluation 09/02/22    PT Start Time 2952    PT Stop Time 1521    PT Time Calculation (min) 56 min    Activity Tolerance Patient tolerated treatment well    Behavior During Therapy WFL for tasks assessed/performed             Past Medical History:  Diagnosis Date   Abnormal chest CT    Actinic keratosis    Allergic rhinitis, unspecified    Anemia    Aortic atherosclerosis ([place])    Arthritis    BPH (benign prostatic hyperplasia)    COPD (chronic obstructive pulmonary disease) ([place])    Coronary artery disease    Cough variant asthma    Diabetes mellitus without complication ([place])    Glaucoma    Hypertension    Other chronic pain    Pancreatic lesion    Pneumonia    Pure hypercholesterolemia, unspecified    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 WD SCC, ulcerated. [facility] 04/29/2020   Past Surgical History:  Procedure Laterality Date   APPLICATION OF INTRAOPERATIVE CT SCAN N/A 05/12/2022   Procedure: APPLICATION OF INTRAOPERATIVE CT SCAN;  Surgeon: Meade Maw, MD;  Location: [facility] ORS;  Service: Neurosurgery;  Laterality: N/A;   BACK SURGERY     BRONCHIAL WASHINGS  11/26/2021   Procedure: BRONCHIAL WASHINGS;  Surgeon: Brand Males, MD;  Location: [facility] ENDOSCOPY;  Service: Endoscopy;;   BUNIONECTOMY Left 2003   hammer toe as well   BUNIONECTOMY WITH HAMMERTOE RECONSTRUCTION Left 2013   repeat   CERVICAL DISC  SURGERY     COLONOSCOPY WITH PROPOFOL N/A 04/30/2021   Procedure: COLONOSCOPY WITH PROPOFOL;  Surgeon: Jonathon Bellows, MD;  Location: [facility] ENDOSCOPY;  Service: Gastroenterology;  Laterality: N/A;   GREEN LIGHT LASER TURP (TRANSURETHRAL RESECTION OF PROSTATE  2010   laser, shrink prostate   KNEE ARTHROSCOPY Right 1991   LEG SURGERY Left    BENIGN BONE TUMOR   LUMBAR [place] SURGERY  2010   discectomy   LUMBAR DISC SURGERY  2011   LUMBAR LAMINECTOMY/DECOMPRESSION MICRODISCECTOMY Right 05/12/2022   Procedure: RIGHT L5-S1 DISCECTOMY;  Surgeon: Meade Maw, MD;  Location: [facility] ORS;  Service: Neurosurgery;  Laterality: Right;   PROSTATE SURGERY  2002   shrink prostate   ROTATOR CUFF REPAIR Left 1999   debride, remove bonespur   ROTATOR CUFF REPAIR Right 1996   TONSILLECTOMY     TRANSFORAMINAL LUMBAR INTERBODY FUSION W/ MIS 1 LEVEL Right 05/12/2022   Procedure: RIGHT L4-5 MINIMALLY INVASIVE (MIS) TRANSFORAMINAL LUMBAR INTERBODY FUSION (TLIF);  Surgeon: Meade Maw, MD;  Location: [facility] ORS;  Service: Neurosurgery;  Laterality: Right;   VIDEO BRONCHOSCOPY N/A 11/26/2021   Procedure: VIDEO BRONCHOSCOPY WITHOUT FLUORO;  Surgeon: Brand Males, MD;  Location: [facility] END[facility];  Service: Endoscopy;  Laterality: N/A;  for chroni ccough BAL   Patient Active Problem List   Diagnosis  Date Noted   Spondylolisthesis at L4-L5 level 05/12/2022   Chronic right-sided low back pain with right-sided sciatica 05/12/2022   Spondylolisthesis of lumbar region 05/12/2022   Other chronic pain    Pre-operative respiratory examination 04/26/2022   Cervical facet joint syndrome 03/09/2022   Osteoarthritis of glenohumeral joint, right 03/02/2022   Cervical spondylosis with radiculopathy 03/02/2022   Lower extremity edema 01/18/2022   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 11/17/2021   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) 11/17/2021   Lumbar radiculopathy 10/27/2021   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/27/2021   History of lumbar surgery 10/27/2021   Chronic pain syndrome 10/27/2021   Postnasal drip 10/06/2021   Derangement of right SI joint 09/28/2021   Spondylosis of lumbosacral region without myelopathy or radiculopathy 09/28/2021   Biceps tendinitis, right 08/26/2021   Supraspinatus syndrome, right 08/26/2021   Deviated septum 07/28/2021   History of chronic sinusitis 07/28/2021   Chronic pansinusitis 05/21/2021   Rhinitis 02/04/2021   Abnormal findings on diagnostic imaging of lung 07/31/2020   Chronic cough 07/31/2020   Centrilobular emphysema ([place]) 07/31/2020   Healthcare maintenance 07/31/2020   Type 2 diabetes mellitus with other specified complication ([place]) 63/84/5364   Kidney cysts 01/11/2020   Pancreatic cyst 01/11/2020   Iron deficiency anemia 01/11/2020   Hyperlipidemia associated with type 2 diabetes mellitus ([place]) 01/11/2020   Primary osteoarthritis involving multiple joints 01/11/2020   BPH associated with nocturia 01/11/2020   Cough variant asthma 08/17/2019   Essential hypertension     PCP: Olin Hauser, DO  REFERRING PROVIDER: Meade Maw, MD  REFERRING DIAG: Chronic R shoulder pain/ Cervical radiculitis  THERAPY DIAG:  Chronic right shoulder pain  Stiffness of right shoulder, not elsewhere classified  Shoulder joint stiffness, right  Muscle weakness (generalized)  Rationale for Evaluation and Treatment: Rehabilitation  ONSET DATE: Chronic  SUBJECTIVE:                                                                                                                                                                                      SUBJECTIVE STATEMENT:  EVALUATION Pt. Reports 7/10 R shoulder pain at worst with reaching and 0/10 at best/ rest.  Pt. Has a 25# lifting restriction secondary to low back surgery (see MD notes).  Pt. Sees [facility]) for back/ hip adjustments.   PERTINENT  HISTORY: See MD notes.  Recert lumbar surgery/ previous cervical surgery.    PAIN:  Are you having pain? Yes: NPRS scale: 7/10 Pain location: R shoulder Pain description: sharp Aggravating factors: reaching Relieving factors: rest  PRECAUTIONS: Back  WEIGHT BEARING RESTRICTIONS: No  FALLS:  Has patient fallen in last 6 months? No  LIVING ENVIRONMENT: Lives with: lives with their spouse Lives in: House/apartment  OCCUPATION: Retired.  Pt. Known to PT clinic/ see previous notes  PLOF: Independent  PATIENT GOALS: Decrease neck/ R shoulder pain with daily tasks.   NEXT MD VISIT: 08/03/22 with Dr. Izora Ribas  OBJECTIVE:   DIAGNOSTIC FINDINGS:  Narrative & Impression  CLINICAL DATA:  Neck pain, chronic, degenerative changes on xray. Prior C-spine fusion, now having severe right neck and shoulder pain, concern for C4/5 radiculopathy.   EXAM: MRI CERVICAL SPINE WITHOUT AND WITH CONTRAST   TECHNIQUE: Multiplanar and multiecho pulse sequences of the cervical spine, to include the craniocervical junction and cervicothoracic junction, were obtained without and with intravenous contrast.   CONTRAST:  68m GADAVIST GADOBUTROL 1 MMOL/ML IV SOLN   COMPARISON:  Cervical spine radiographs 03/02/2022   FINDINGS: Alignment: Minimal anterolisthesis of C3 on C4, C4 on C5, and C5 on C6.   Vertebrae: No fracture, suspicious marrow lesion, or significant marrow edema. Solid interbody osseous fusion at C5-6 and C6-7.   Cord: Normal cord signal and morphology.  No abnormal enhancement.   Posterior Fossa, vertebral arteries, paraspinal tissues: Unremarkable.   Disc levels:   C2-3: Minimal disc bulging, uncovertebral spurring, and moderate to severe right and mild left facet arthrosis without significant stenosis.   C3-4: Mild disc space narrowing. Anterolisthesis with bulging uncovered disc, uncovertebral spurring, and severe facet arthrosis result in mild spinal stenosis and  moderate bilateral neural foraminal stenosis.   C4-5: Mild disc space narrowing. Anterolisthesis with bulging uncovered disc, asymmetric right uncovertebral spurring, and severe right and moderate left facet arthrosis result in mild spinal stenosis and moderate right neural foraminal stenosis.   C5-6: Anterior fusion.  No stenosis.   C6-7: Anterior fusion.  No stenosis.   C7-T1: Moderate disc space narrowing. Disc bulging, uncovertebral spurring, and mild-to-moderate facet arthrosis result in mild-to-moderate right neural foraminal stenosis without spinal stenosis.   IMPRESSION: 1. Solid anterior fusion at C5-6 and C6-7 without residual stenosis. 2. Severe upper cervical facet arthrosis with mild spinal stenosis and moderate neural foraminal stenosis at C3-4 and C4-5. 3. Mild-to-moderate right neural foraminal stenosis at C7-T1.      PATIENT SURVEYS:  FOTO initial 13/ goal 544 COGNITION: Overall cognitive status: Within functional limits for tasks assessed     SENSATION: WFL  POSTURE: Rounded shoulder/ forward head posture.  UPPER EXTREMITY ROM:   Active ROM Right eval Left eval  Shoulder flexion 72 deg. 152 deg.  Shoulder extension    Shoulder abduction 68 deg. 146 deg.  Shoulder adduction    Shoulder internal rotation 50 deg. 70 deg.  Shoulder external rotation 50 deg. 74 deg.  Elbow flexion WNL WNL  Elbow extension WNL WNL  Wrist flexion WNL WNL  Wrist extension WNL WNL  Wrist ulnar deviation    Wrist radial deviation    Wrist pronation    Wrist supination    (Blank rows = not tested)  R shoulder flexion 138 deg. Passive.  Cervical AROM: flexion 25 deg./ extension 60 deg./ L rotn. 64 deg./ R rotn. 64 deg./ L lat. Flex. 24 deg./ R lat. Flex. 32 deg.    UPPER EXTREMITY MMT:  Next tx session  SHOULDER SPECIAL TESTS: Limited R shoulder ROM limits testing  JOINT MOBILITY TESTING:  Limited  PALPATION:  (+) R posterior deltoid tenderness with  palpation.    Seated R shoulder flexion 3/5 MMT, abduction 3/5 MMT, extension 4/5 MMT.  Bicep 4+/5 MMT, tricep 4/5 MMT.  1/11:  R shoulder AROM in supine: flexion (118 deg.), abduction (90 deg.) (with pain, less with abduction), IR (56 deg.), ER (41 deg.)- pain(6/10), PROM: flexion (137 deg.), abduction (115 deg.).    TODAY'S TREATMENT:                                                                                                                                         DATE: 08/12/22  SUBJECTIVE:  Pt. Arrived to therapy with no pain at rest. Pt. was sore in R upper trap after soft tissue work on trigger point at previous session.  Pt. Reported the soreness was relieved when he applied heat.   There.ex.:  B UBE 3 min. F/b.    Nautilus: seated 60# lat. Pull down 12x3 / standing tricep extension(30# 2nd set, 40# 1st set) 10x3 reps / 60# scapular retraction 15x2. / bicep flexion 20# 2 x 15 reps / IR.  Unable to complete ER with 10# resistance.    R sidelying L shoulder ER/  L sidelying R shoulder ER while PT applied light isometric resistance.  Marked muscle weakness/ limitations.          Manual tx.: MH to R UT/ neck/ posterior shoulder.  Manual trigger point release technique. <5 min. (No charge).     PATIENT EDUCATION: Education details: Discussed posture/ lifting limitations Person educated: Patient Education method: Explanation Education comprehension: verbalized understanding  HOME EXERCISE PROGRAM: Pulley ex. (Flexion/ abduction)- pain-free range   ASSESSMENT:  CLINICAL IMPRESSION: Pt. presents with R shoulder ROM/ strength limitations. No c/o neck pain t/o tx. Session. Pt. Showed remarkable decrease in strength of resisted ER. Pt. Will continue with current HEP and use of pulley ex in a pain tolerable range. Pt. Desires to get stronger and increase motion to get back to the golf course.  Pt. Waiting to hear back from Dr. Roland Rack office for appt.  Pt. Will benefit from skilled PT  services to develop HEP and to improve pain-free functional mobility.   OBJECTIVE IMPAIRMENTS: decreased activity tolerance, decreased mobility, decreased ROM, decreased strength, hypomobility, impaired flexibility, improper body mechanics, postural dysfunction, and pain.   ACTIVITY LIMITATIONS: carrying, lifting, standing, reach over head, and hygiene/grooming  PARTICIPATION LIMITATIONS: interpersonal relationship and community activity  PERSONAL FACTORS: Age and Past/current experiences are also affecting patient's functional outcome.   REHAB POTENTIAL: Good  CLINICAL DECISION MAKING: Evolving/moderate complexity  EVALUATION COMPLEXITY: Moderate   GOALS: Goals reviewed with patient? Yes  SHORT TERM GOALS: Target date: 07/20/22  Pt. Independent with HEP to increase cervical AROM to [facility] to improve painfree mobility.  Baseline: See above Goal status:  Goal met   LONG TERM GOALS: Target date: 09/02/22  Pt. Will increase FOTO to 52 to improve pain-free mobility.  Baseline: initial FOTO 13.  1/9: 65 Goal status: Goal met  2.  Pt. Will report <4/10 R  shoulder pain with forward reaching and carrying <20# objects to improve household tasks.  Baseline:  7/10 pain with R shoulder use.  1/9: 5/10- pt. Reports he is doing better.  Goal status: Partially met  3.  Pt. Will be able to complete 30 minutes of exercise at gym with no increase c/o neck/ R shoulder pain.   Baseline:  pain limited Goal status: Ongoing   PLAN:  PT FREQUENCY: 2x/week  PT DURATION: 4 weeks  PLANNED INTERVENTIONS: Therapeutic exercises, Therapeutic activity, Neuromuscular re-education, Patient/Family education, Self Care, Joint mobilization, Dry Needling, Electrical stimulation, Cryotherapy, Moist heat, Manual therapy, and Re-evaluation  PLAN FOR NEXT SESSION:  Progress strengthening. Work on golf swing.  ISSUE NEW HEP  Billye Nydam B. Rogers Blocker, SPT Pura Spice, PT, DPT # [phone] 08/12/2022, 6:06 PM

## 2022-08-13 ENCOUNTER — Other Ambulatory Visit (HOSPITAL_COMMUNITY): Payer: Self-pay

## 2022-08-13 ENCOUNTER — Encounter: Payer: Self-pay | Admitting: Urology

## 2022-08-13 ENCOUNTER — Other Ambulatory Visit: Payer: Self-pay

## 2022-08-13 ENCOUNTER — Other Ambulatory Visit: Payer: Self-pay | Admitting: Family Medicine

## 2022-08-13 DIAGNOSIS — E1169 Type 2 diabetes mellitus with other specified complication: Secondary | ICD-10-CM

## 2022-08-13 MED ORDER — CELECOXIB 200 MG PO CAPS
200.0000 mg | ORAL_CAPSULE | Freq: Two times a day (BID) | ORAL | 0 refills | Status: DC
  Filled 2022-08-13 (×2): qty 60, 30d supply, fill #0

## 2022-08-16 NOTE — Telephone Encounter (Signed)
Unable to refill per protocol, Rx request is too soon. Last refill 07/29/22 for 90 days. Will refuse duplicate request.  Requested Prescriptions  Pending Prescriptions Disp Refills   rosuvastatin (CRESTOR) 5 MG tablet 90 tablet 0    Sig: TAKE ONE TABLET BY MOUTH ONCE DAILY     Cardiovascular:  Antilipid - Statins 2 Failed - 08/13/2022 12:42 PM      Failed - Lipid Panel in normal range within the last 12 months    Cholesterol  Date Value Ref Range Status  06/21/2022 128 <200 mg/dL Final   LDL Cholesterol (Calc)  Date Value Ref Range Status  06/21/2022 56 mg/dL (calc) Final    Comment:    Reference range: <100 . Desirable range <100 mg/dL for primary prevention;   <70 mg/dL for patients with CHD or diabetic patients  with > or = 2 CHD risk factors. Marland Kitchen LDL-C is now calculated using the Martin-Hopkins  calculation, which is a validated novel method providing  better accuracy than the Friedewald equation in the  estimation of LDL-C.  Cresenciano Genre et al. Annamaria Helling. 8115;726(20): 2061-2068  (http://education.[employer].com/faq/FAQ164)    HDL  Date Value Ref Range Status  06/21/2022 58 > OR = 40 mg/dL Final   Triglycerides  Date Value Ref Range Status  06/21/2022 61 <150 mg/dL Final         Passed - Cr in normal range and within 360 days    Creatinine  Date Value Ref Range Status  09/16/2014 0.9 0.7 - 1.3 mg/dL Final   Creat  Date Value Ref Range Status  06/21/2022 0.73 0.70 - 1.28 mg/dL Final   Creatinine, Urine  Date Value Ref Range Status  06/24/2022 151 20 - 320 mg/dL Final         Passed - Patient is not pregnant      Passed - Valid encounter within last 12 months    Recent Outpatient Visits           1 month ago Annual physical exam   [facility] Olin Hauser, DO   2 months ago Essential hypertension   [place], DO   4 months ago Acute rhinosinusitis   [facility] Olin Hauser, DO   5 months ago Cervical spondylosis with radiculopathy   [facility] [place], Earley Abide, MD   5 months ago Supraspinatus syndrome, right   [place] [facility], Earley Abide, MD       Future Appointments             In 1 week Ralene Bathe, MD [place]   In 2 weeks [place], Charlann Lange, MD [place] [facility] [place]   In 4 months [place], Devonne Doughty, [facility], [place]   In 4 months Diamantina [place], Herbert Seta, MD [place]

## 2022-08-17 ENCOUNTER — Other Ambulatory Visit (HOSPITAL_COMMUNITY): Payer: Self-pay

## 2022-08-17 ENCOUNTER — Ambulatory Visit: Payer: PPO | Admitting: Physical Therapy

## 2022-08-17 ENCOUNTER — Encounter: Payer: Self-pay | Admitting: Family Medicine

## 2022-08-17 ENCOUNTER — Encounter: Payer: Self-pay | Admitting: Physical Therapy

## 2022-08-17 DIAGNOSIS — M6281 Muscle weakness (generalized): Secondary | ICD-10-CM

## 2022-08-17 DIAGNOSIS — M25511 Pain in right shoulder: Secondary | ICD-10-CM | POA: Diagnosis not present

## 2022-08-17 DIAGNOSIS — E1169 Type 2 diabetes mellitus with other specified complication: Secondary | ICD-10-CM

## 2022-08-17 DIAGNOSIS — M25611 Stiffness of right shoulder, not elsewhere classified: Secondary | ICD-10-CM

## 2022-08-17 DIAGNOSIS — M542 Cervicalgia: Secondary | ICD-10-CM

## 2022-08-17 DIAGNOSIS — G8929 Other chronic pain: Secondary | ICD-10-CM

## 2022-08-17 DIAGNOSIS — I10 Essential (primary) hypertension: Secondary | ICD-10-CM

## 2022-08-17 NOTE — Therapy (Signed)
OUTPATIENT PHYSICAL THERAPY SHOULDER TREATMENT  Patient Name: Isaac Weber MRN: [id number] DOB:1945-09-09, 77 y.o., male Today's Date: 08/17/2022  END OF SESSION:  PT End of Session - 08/17/22 1426     Visit Number 13    Number of Visits 18    Date for PT Re-Evaluation 09/02/22    PT Start Time 1426    PT Stop Time 1518    PT Time Calculation (min) 52 min    Activity Tolerance Patient tolerated treatment well    Behavior During Therapy WFL for tasks assessed/performed             Past Medical History:  Diagnosis Date   Abnormal chest CT    Actinic keratosis    Allergic rhinitis, unspecified    Anemia    Aortic atherosclerosis ([place])    Arthritis    BPH (benign prostatic hyperplasia)    COPD (chronic obstructive pulmonary disease) ([place])    Coronary artery disease    Cough variant asthma    Diabetes mellitus without complication ([place])    Glaucoma    Hypertension    Other chronic pain    Pancreatic lesion    Pneumonia    Pure hypercholesterolemia, unspecified    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 WD SCC, ulcerated. [facility] 04/29/2020   Past Surgical History:  Procedure Laterality Date   APPLICATION OF INTRAOPERATIVE CT SCAN N/A 05/12/2022   Procedure: APPLICATION OF INTRAOPERATIVE CT SCAN;  Surgeon: Meade Maw, MD;  Location: [facility] ORS;  Service: Neurosurgery;  Laterality: N/A;   BACK SURGERY     BRONCHIAL WASHINGS  11/26/2021   Procedure: BRONCHIAL WASHINGS;  Surgeon: Brand Males, MD;  Location: [facility] ENDOSCOPY;  Service: Endoscopy;;   BUNIONECTOMY Left 2003   hammer toe as well   BUNIONECTOMY WITH HAMMERTOE RECONSTRUCTION Left 2013   repeat   CERVICAL DISC  SURGERY     COLONOSCOPY WITH PROPOFOL N/A 04/30/2021   Procedure: COLONOSCOPY WITH PROPOFOL;  Surgeon: Jonathon Bellows, MD;  Location: [facility] END[facility]COPY;  Service: Gastroenterology;  Laterality: N/A;   GREEN LIGHT LASER TURP (TRANSURETHRAL RESECTION OF PROSTATE  2010   laser, shrink prostate   KNEE ARTHROSCOPY Right 1991   LEG SURGERY Left    BENIGN BONE TUMOR   LUMBAR [place] SURGERY  2010   discectomy   LUMBAR DISC SURGERY  2011   LUMBAR LAMINECTOMY/DECOMPRESSION MICRODISCECTOMY Right 05/12/2022   Procedure: RIGHT L5-S1 DISCECTOMY;  Surgeon: Meade Maw, MD;  Location: [facility] ORS;  Service: Neurosurgery;  Laterality: Right;   PROSTATE SURGERY  2002   shrink prostate   ROTATOR CUFF REPAIR Left 1999   debride, remove bonespur   ROTATOR CUFF REPAIR Right 1996   TONSILLECTOMY     TRANSFORAMINAL LUMBAR INTERBODY FUSION W/ MIS 1 LEVEL Right 05/12/2022   Procedure: RIGHT L4-5 MINIMALLY INVASIVE (MIS) TRANSFORAMINAL LUMBAR INTERBODY FUSION (TLIF);  Surgeon: Meade Maw, MD;  Location: [facility] ORS;  Service: Neurosurgery;  Laterality: Right;   VIDEO BRONCHOSCOPY N/A 11/26/2021   Procedure: VIDEO BRONCHOSCOPY WITHOUT FLUORO;  Surgeon: Brand Males, MD;  Location: [facility];  Service: Endoscopy;  Laterality: N/A;  for chroni ccough BAL   Patient Active Problem List   Diagnosis  Date Noted   Spondylolisthesis at L4-L5 level 05/12/2022   Chronic right-sided low back pain with right-sided sciatica 05/12/2022   Spondylolisthesis of lumbar region 05/12/2022   Other chronic pain    Pre-operative respiratory examination 04/26/2022   Cervical facet joint syndrome 03/09/2022   Osteoarthritis of glenohumeral joint, right 03/02/2022   Cervical spondylosis with radiculopathy 03/02/2022   Lower extremity edema 01/18/2022   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 11/17/2021   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) 11/17/2021   Lumbar radiculopathy 10/27/2021   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/27/2021   History of lumbar surgery 10/27/2021   Chronic pain syndrome 10/27/2021   Postnasal drip 10/06/2021   Derangement of right SI joint 09/28/2021   Spondylosis of lumbosacral region without myelopathy or radiculopathy 09/28/2021   Biceps tendinitis, right 08/26/2021   Supraspinatus syndrome, right 08/26/2021   Deviated septum 07/28/2021   History of chronic sinusitis 07/28/2021   Chronic pansinusitis 05/21/2021   Rhinitis 02/04/2021   Abnormal findings on diagnostic imaging of lung 07/31/2020   Chronic cough 07/31/2020   Centrilobular emphysema ([place]) 07/31/2020   Healthcare maintenance 07/31/2020   Type 2 diabetes mellitus with other specified complication ([place]) 74/02/1447   Kidney cysts 01/11/2020   Pancreatic cyst 01/11/2020   Iron deficiency anemia 01/11/2020   Hyperlipidemia associated with type 2 diabetes mellitus ([place]) 01/11/2020   Primary osteoarthritis involving multiple joints 01/11/2020   BPH associated with nocturia 01/11/2020   Cough variant asthma 08/17/2019   Essential hypertension     PCP: Olin Hauser, DO  REFERRING PROVIDER: Meade Maw, MD  REFERRING DIAG: Chronic R shoulder pain/ Cervical radiculitis  THERAPY DIAG:  Chronic right shoulder pain  Stiffness of right shoulder, not elsewhere classified  Shoulder joint stiffness, right  Muscle weakness (generalized)  Cervicalgia  Rationale for Evaluation and Treatment: Rehabilitation  ONSET DATE: Chronic  SUBJECTIVE:                                                                                                                                                                                      SUBJECTIVE STATEMENT:  EVALUATION Pt. Reports 7/10 R shoulder pain at worst with reaching and 0/10 at best/ rest.  Pt. Has a 25# lifting restriction secondary to low back surgery (see MD notes).  Pt. Sees Tim Beshal [facility]) for back/ hip adjustments.    PERTINENT HISTORY: See MD notes.  Recert lumbar surgery/ previous cervical surgery.    PAIN:  Are you having pain? Yes: NPRS scale: 7/10 Pain location: R shoulder Pain description: sharp Aggravating factors: reaching Relieving factors: rest  PRECAUTIONS: Back  WEIGHT BEARING RESTRICTIONS: No  FALLS:  Has patient fallen in last 6 months? No  LIVING ENVIRONMENT: Lives with: lives with their spouse Lives in: House/apartment  OCCUPATION: Retired.  Pt. Known to PT clinic/ see previous notes  PLOF: Independent  PATIENT GOALS: Decrease neck/ R shoulder pain with daily tasks.   NEXT MD VISIT: 08/03/22 with Dr. Izora Ribas  OBJECTIVE:   DIAGNOSTIC FINDINGS:  Narrative & Impression  CLINICAL DATA:  Neck pain, chronic, degenerative changes on xray. Prior C-spine fusion, now having severe right neck and shoulder pain, concern for C4/5 radiculopathy.   EXAM: MRI CERVICAL SPINE WITHOUT AND WITH CONTRAST   TECHNIQUE: Multiplanar and multiecho pulse sequences of the cervical spine, to include the craniocervical junction and cervicothoracic junction, were obtained without and with intravenous contrast.   CONTRAST:  84m GADAVIST GADOBUTROL 1 MMOL/ML IV SOLN   COMPARISON:  Cervical spine radiographs 03/02/2022   FINDINGS: Alignment: Minimal anterolisthesis of C3 on C4, C4 on C5, and C5 on C6.   Vertebrae: No fracture, suspicious marrow lesion, or significant marrow edema. Solid interbody osseous fusion at C5-6 and C6-7.   Cord: Normal cord signal and morphology.  No abnormal enhancement.   Posterior Fossa, vertebral arteries, paraspinal tissues: Unremarkable.   Disc levels:   C2-3: Minimal disc bulging, uncovertebral spurring, and moderate to severe right and mild left facet arthrosis without significant stenosis.   C3-4: Mild disc space narrowing. Anterolisthesis with bulging uncovered disc, uncovertebral spurring, and severe facet arthrosis result in mild spinal  stenosis and moderate bilateral neural foraminal stenosis.   C4-5: Mild disc space narrowing. Anterolisthesis with bulging uncovered disc, asymmetric right uncovertebral spurring, and severe right and moderate left facet arthrosis result in mild spinal stenosis and moderate right neural foraminal stenosis.   C5-6: Anterior fusion.  No stenosis.   C6-7: Anterior fusion.  No stenosis.   C7-T1: Moderate disc space narrowing. Disc bulging, uncovertebral spurring, and mild-to-moderate facet arthrosis result in mild-to-moderate right neural foraminal stenosis without spinal stenosis.   IMPRESSION: 1. Solid anterior fusion at C5-6 and C6-7 without residual stenosis. 2. Severe upper cervical facet arthrosis with mild spinal stenosis and moderate neural foraminal stenosis at C3-4 and C4-5. 3. Mild-to-moderate right neural foraminal stenosis at C7-T1.      PATIENT SURVEYS:  FOTO initial 13/ goal 598 COGNITION: Overall cognitive status: Within functional limits for tasks assessed     SENSATION: WFL  POSTURE: Rounded shoulder/ forward head posture.  UPPER EXTREMITY ROM:   Active ROM Right eval Left eval  Shoulder flexion 72 deg. 152 deg.  Shoulder extension    Shoulder abduction 68 deg. 146 deg.  Shoulder adduction    Shoulder internal rotation 50 deg. 70 deg.  Shoulder external rotation 50 deg. 74 deg.  Elbow flexion WNL WNL  Elbow extension WNL WNL  Wrist flexion WNL WNL  Wrist extension WNL WNL  Wrist ulnar deviation    Wrist radial deviation    Wrist pronation    Wrist supination    (Blank rows = not tested)  R shoulder flexion 138 deg. Passive.  Cervical AROM: flexion 25 deg./ extension 60 deg./ L rotn. 64 deg./ R rotn. 64 deg./ L lat. Flex. 24 deg./ R lat. Flex. 32 deg.    UPPER EXTREMITY MMT:  Next tx session  SHOULDER SPECIAL TESTS: Limited R shoulder ROM limits testing  JOINT MOBILITY TESTING:  Limited  PALPATION:  (+) R posterior deltoid  tenderness with palpation.    Seated R shoulder flexion 3/5 MMT, abduction 3/5 MMT, extension  4/5 MMT.  Bicep 4+/5 MMT, tricep 4/5 MMT.  1/11:  R shoulder AROM in supine: flexion (118 deg.), abduction (90 deg.) (with pain, less with abduction), IR (56 deg.), ER (41 deg.)- pain(6/10), PROM: flexion (137 deg.), abduction (115 deg.).    TODAY'S TREATMENT:                                                                                                                                         DATE: 08/17/22  SUBJECTIVE:  Pt. Arrived to therapy with no R shoulder pain. Pt. Has appt. With Dr. Nicholaus Bloom office tomorrow.  Pt. Will contact PT after MD visit to discuss POC.    There.ex.:  B UBE 3 min. F/b.    No Nautilus today.   See new HEP  (see below)    PATIENT EDUCATION: Education details: Discussed posture/ lifting limitations Person educated: Patient Education method: Explanation Education comprehension: verbalized understanding  HOME EXERCISE PROGRAM: Pulley ex. (Flexion/ abduction)- pain-free range   Access Code: [email or web address] URL: [email or web address] Date: 08/17/2022 Prepared by: Dorcas Carrow  Exercises - Seated Shoulder Flexion AAROM with Pulley Behind  - 1 x daily - 7 x weekly - 1 sets - 20 reps - Seated Shoulder Abduction AAROM with Pulley Behind  - 1 x daily - 7 x weekly - 1 sets - 20 reps - Wall Push Up  - 1 x daily - 4 x weekly - 3 sets - 12 reps - Standing Bicep Curls Supinated with Dumbbells  - 1 x daily - 4 x weekly - 2 sets - 15 reps - Standing Single Arm Shoulder PNF D1 Extension (Mirrored)  - 1 x daily - 4 x weekly - 2 sets - 15 reps - Scapular Retraction with Resistance  - 1 x daily - 4 x weekly - 2 sets - 15 reps - Standing Tricep Extensions with Resistance  - 1 x daily - 4 x weekly - 2 sets - 15 reps - Standing Single Arm Shoulder Shrug Circles AROM Backward  - 1 x daily - 4 x weekly - 2 sets - 15 reps - Standing Isometric Shoulder External Rotation  with Doorway and Towel Roll  - 1 x daily - 4 x weekly - 1 sets - 10 reps - 2 seconds hold  ASSESSMENT:  CLINICAL IMPRESSION: Pt. presents with R shoulder ROM/ strength limitations. No c/o neck pain t/o tx. Session. PT tx. Focused on issuing a specific HEP for pt. To be able to complete R shoulder UE ex. On an independent basis.  Pt. Desires to get stronger and increase motion to get back to the golf course.   PT tx. On hold at this time and pt. Will contact PT after MD appt. Tomorrow to discuss POC with R shoulder.   OBJECTIVE IMPAIRMENTS: decreased activity tolerance, decreased mobility, decreased ROM, decreased strength, hypomobility, impaired flexibility, improper body mechanics, postural dysfunction, and pain.  ACTIVITY LIMITATIONS: carrying, lifting, standing, reach over head, and hygiene/grooming  PARTICIPATION LIMITATIONS: interpersonal relationship and community activity  PERSONAL FACTORS: Age and Past/current experiences are also affecting patient's functional outcome.   REHAB POTENTIAL: Good  CLINICAL DECISION MAKING: Evolving/moderate complexity  EVALUATION COMPLEXITY: Moderate   GOALS: Goals reviewed with patient? Yes  SHORT TERM GOALS: Target date: 07/20/22  Pt. Independent with HEP to increase cervical AROM to [facility] to improve painfree mobility.  Baseline: See above Goal status:  Goal met   LONG TERM GOALS: Target date: 09/02/22  Pt. Will increase FOTO to 52 to improve pain-free mobility.  Baseline: initial FOTO 13.  1/9: 65 Goal status: Goal met  2.  Pt. Will report <4/10 R shoulder pain with forward reaching and carrying <20# objects to improve household tasks.  Baseline:  7/10 pain with R shoulder use.  1/9: 5/10- pt. Reports he is doing better.  Goal status: Partially met  3.  Pt. Will be able to complete 30 minutes of exercise at gym with no increase c/o neck/ R shoulder pain.   Baseline:  pain limited.  1/23: discussed returning to gym based ex. At [employer].  Goal status: Ongoing   PLAN:  PT FREQUENCY: 2x/week  PT DURATION: 4 weeks  PLANNED INTERVENTIONS: Therapeutic exercises, Therapeutic activity, Neuromuscular re-education, Patient/Family education, Self Care, Joint mobilization, Dry Needling, Electrical stimulation, Cryotherapy, Moist heat, Manual therapy, and Re-evaluation  PLAN FOR NEXT SESSION:  Pt. Will contact PT after MD appt.   Ashlyn B. Rogers Blocker, SPT Pura Spice, PT, DPT # [phone] 08/17/2022, 3:19 PM

## 2022-08-18 ENCOUNTER — Other Ambulatory Visit: Payer: Self-pay | Admitting: Family Medicine

## 2022-08-18 ENCOUNTER — Ambulatory Visit: Payer: Self-pay

## 2022-08-18 ENCOUNTER — Other Ambulatory Visit (HOSPITAL_COMMUNITY): Payer: Self-pay

## 2022-08-18 DIAGNOSIS — M12811 Other specific arthropathies, not elsewhere classified, right shoulder: Secondary | ICD-10-CM | POA: Diagnosis not present

## 2022-08-18 DIAGNOSIS — E785 Hyperlipidemia, unspecified: Secondary | ICD-10-CM | POA: Diagnosis not present

## 2022-08-18 DIAGNOSIS — E1169 Type 2 diabetes mellitus with other specified complication: Secondary | ICD-10-CM

## 2022-08-18 DIAGNOSIS — I10 Essential (primary) hypertension: Secondary | ICD-10-CM

## 2022-08-18 DIAGNOSIS — M25511 Pain in right shoulder: Secondary | ICD-10-CM | POA: Diagnosis not present

## 2022-08-18 MED ORDER — LOSARTAN POTASSIUM 50 MG PO TABS
50.0000 mg | ORAL_TABLET | Freq: Every day | ORAL | 1 refills | Status: DC
  Filled 2022-08-18: qty 90, 90d supply, fill #0

## 2022-08-18 MED ORDER — LOSARTAN POTASSIUM 50 MG PO TABS
50.0000 mg | ORAL_TABLET | Freq: Every day | ORAL | 3 refills | Status: DC
  Filled 2022-08-18 – 2022-08-19 (×2): qty 90, 90d supply, fill #0
  Filled 2022-11-20: qty 90, 90d supply, fill #1
  Filled 2023-02-17: qty 90, 90d supply, fill #2
  Filled 2023-05-21: qty 90, 90d supply, fill #3

## 2022-08-18 MED ORDER — ROSUVASTATIN CALCIUM 5 MG PO TABS
5.0000 mg | ORAL_TABLET | Freq: Every day | ORAL | 3 refills | Status: DC
  Filled 2022-08-18: qty 90, 90d supply, fill #0

## 2022-08-18 MED ORDER — ROSUVASTATIN CALCIUM 5 MG PO TABS
5.0000 mg | ORAL_TABLET | Freq: Every day | ORAL | 3 refills | Status: DC
  Filled 2022-08-18 – 2022-08-19 (×2): qty 90, 90d supply, fill #0
  Filled 2022-11-20: qty 90, 90d supply, fill #1
  Filled 2023-02-17: qty 90, 90d supply, fill #2
  Filled 2023-05-21: qty 90, 90d supply, fill #3

## 2022-08-18 NOTE — Addendum Note (Signed)
Addended by: Matilde Sprang on: 08/18/2022 05:18 PM   Modules accepted: Orders

## 2022-08-18 NOTE — Telephone Encounter (Signed)
ummary: Medication Managment   Patient was advised by PCP if he wanted to increase his metFORMIN (GLUCOPHAGE-XR) 500 MG 24 hr tablet to 2 daily PCP would increase frequency. Patient states when he was on 2 tablets daily his blood sugar was within normal range and he states now that he is taking 1 tablet daily he noticed his blood sugar is not within range.     Fasting blood glucose running 161,198, 191,Asking to increase Metformin to twice a day. Please advise Will need new prescription if increased. Answer Assessment - Initial Assessment Questions 1. NAME of MEDICINE: "What medicine(s) are you calling about?"     pT. Ants to increase Metformin to twice s dsy                                                                                                                                                                                                     2. QUESTION: "What is your question?" (e.g., double dose of medicine, side effect)     Increase med 3. PRESCRIBER: "Who prescribed the medicine?" Reason: if prescribed by specialist, call should be referred to that group.     Dr. Raliegh Ip 4. SYMPTOMS: "Do you have any symptoms?" If Yes, ask: "What symptoms are you having?"  "How bad are the symptoms (e.g., mild, moderate, severe)     No 5. PREGNANCY:  "Is there any chance that you are pregnant?" "When was your last menstrual period?"     N/a  Protocols used: Medication Question Call-A-AH

## 2022-08-18 NOTE — Telephone Encounter (Signed)
Pt called to check status of refill. Refill status is approved however there is no e-receipt confirming the pharmacy has his two Rxs.

## 2022-08-18 NOTE — Telephone Encounter (Signed)
Requested Prescriptions  Pending Prescriptions Disp Refills   rosuvastatin (CRESTOR) 5 MG tablet 90 tablet 3    Sig: Take 1 tablet (5 mg total) by mouth daily.     Cardiovascular:  Antilipid - Statins 2 Failed - 08/18/2022  5:18 PM      Failed - Lipid Panel in normal range within the last 12 months    Cholesterol  Date Value Ref Range Status  06/21/2022 128 <200 mg/dL Final   LDL Cholesterol (Calc)  Date Value Ref Range Status  06/21/2022 56 mg/dL (calc) Final    Comment:    Reference range: <100 . Desirable range <100 mg/dL for primary prevention;   <70 mg/dL for patients with CHD or diabetic patients  with > or = 2 CHD risk factors. Marland Kitchen LDL-C is now calculated using the Martin-Hopkins  calculation, which is a validated novel method providing  better accuracy than the Friedewald equation in the  estimation of LDL-C.  Cresenciano Genre et al. Annamaria Helling. 6222;979(89): 2061-2068  (http://education.[employer].com/faq/FAQ164)    HDL  Date Value Ref Range Status  06/21/2022 58 > OR = 40 mg/dL Final   Triglycerides  Date Value Ref Range Status  06/21/2022 61 <150 mg/dL Final         Passed - Cr in normal range and within 360 days    Creatinine  Date Value Ref Range Status  09/16/2014 0.9 0.7 - 1.3 mg/dL Final   Creat  Date Value Ref Range Status  06/21/2022 0.73 0.70 - 1.28 mg/dL Final   Creatinine, Urine  Date Value Ref Range Status  06/24/2022 151 20 - 320 mg/dL Final         Passed - Patient is not pregnant      Passed - Valid encounter within last 12 months    Recent Outpatient Visits           1 month ago Annual physical exam   [facility] Olin Hauser, DO   2 months ago Essential hypertension   [place], DO   4 months ago Acute rhinosinusitis   [facility] Olin Hauser, DO   5 months ago Cervical spondylosis with radiculopathy    [place] [place], Earley Abide, MD   6 months ago Supraspinatus syndrome, right   [place] [facility] [place], Earley Abide, MD       Future Appointments             In 5 days Ralene Bathe, MD [place]   In 2 weeks Irish Lack, Charlann Lange, MD [place] at [facility], [place]   In 4 months [place], Devonne Doughty, [facility], [place]   In 4 months [name], MD [facility] [place]             losartan (COZAAR) 50 MG tablet 90 tablet 1    Sig: Take 1 tablet (50 mg total) by mouth daily.     Cardiovascular:  Angiotensin Receptor Blockers Passed - 08/18/2022  5:18 PM      Passed - Cr in normal range and within 180 days    Creatinine  Date Value Ref Range Status  09/16/2014 0.9 0.7 - 1.3 mg/dL Final   Creat  Date Value Ref Range Status  06/21/2022 0.73 0.70 -  1.28 mg/dL Final   Creatinine, Urine  Date Value Ref Range Status  06/24/2022 151 20 - 320 mg/dL Final         Passed - K in normal range and within 180 days    Potassium  Date Value Ref Range Status  06/21/2022 4.0 3.5 - 5.3 mmol/L Final  09/16/2014 3.7 3.5 - 5.1 mEq/L Final         Passed - Patient is not pregnant      Passed - Last BP in normal range    BP Readings from Last 1 Encounters:  06/24/22 124/68         Passed - Valid encounter within last 6 months    Recent Outpatient Visits           1 month ago Annual physical exam   [facility] Olin Hauser, DO   2 months ago Essential hypertension   [place], DO   4 months ago Acute rhinosinusitis   [facility] Olin Hauser, DO   5 months ago Cervical spondylosis with radiculopathy   [place] [facility] [place], Earley Abide, MD   6 months ago Supraspinatus syndrome, right   [place] [facility] [place], Earley Abide, MD       Future Appointments             In 5 days Ralene Bathe, MD [place]   In 2 weeks Irish Lack, Charlann Lange, MD [place] at [facility], [place]   In 4 months Parks Ranger, Devonne Doughty, [facility], [place]   In 4 months [name], MD [place] [facility] [place]            Signed Prescriptions Disp Refills   losartan (COZAAR) 50 MG tablet 90 tablet 1    Sig: Take 1 tablet (50 mg total) by mouth daily.     Cardiovascular:  Angiotensin Receptor Blockers Passed - 08/18/2022 11:47 AM      Passed - Cr in normal range and within 180 days    Creatinine  Date Value Ref Range Status  09/16/2014 0.9 0.7 - 1.3 mg/dL Final   Creat  Date Value Ref Range Status  06/21/2022 0.73 0.70 - 1.28 mg/dL Final   Creatinine, Urine  Date Value Ref Range Status  06/24/2022 151 20 - 320 mg/dL Final         Passed - K in normal range and within 180 days    Potassium  Date Value Ref Range Status  06/21/2022 4.0 3.5 - 5.3 mmol/L Final  09/16/2014 3.7 3.5 - 5.1 mEq/L Final         Passed - Patient is not pregnant      Passed - Last BP in normal range    BP Readings from Last 1 Encounters:  06/24/22 124/68         Passed - Valid encounter within last 6 months    Recent Outpatient Visits           1 month ago Annual physical exam   [place], DO   2 months ago Essential hypertension   [place], DO   4 months ago Acute rhinosinusitis   [place]  [facility] Olin Hauser, DO   5 months ago Cervical spondylosis with radiculopathy   [place] [facility] [place], Earley Abide, MD   6 months ago Supraspinatus syndrome, right   [place] at [place], Earley Abide, MD       Future Appointments             In 5 days Ralene Bathe, MD [place]   In 2 weeks Jettie Booze, MD [facility], [place]   In 4 months Parks Ranger, Devonne Doughty, DO [facility], [place]   In 4 months [name], MD [place] [facility]              rosuvastatin (CRESTOR) 5 MG tablet 90 tablet 3    Sig: Take 1 tablet (5 mg total) by mouth daily.     Cardiovascular:  Antilipid - Statins 2 Failed - 08/18/2022 11:47 AM      Failed - Lipid Panel in normal range within the last 12 months    Cholesterol  Date Value Ref Range Status  06/21/2022 128 <200 mg/dL Final   LDL Cholesterol (Calc)  Date Value Ref Range Status  06/21/2022 56 mg/dL (calc) Final    Comment:    Reference range: <100 . Desirable range <100 mg/dL for primary prevention;   <70 mg/dL for patients with CHD or diabetic patients  with > or = 2 CHD risk factors. Marland Kitchen LDL-C is now calculated using the Martin-Hopkins  calculation, which is a validated novel method providing  better accuracy than the Friedewald equation in the  estimation of LDL-C.  Cresenciano Genre et al. Annamaria Helling. 4098;119(14): 2061-2068  (http://education.[facility].com/faq/FAQ164)    HDL  Date Value Ref Range Status  06/21/2022 58 > OR = 40 mg/dL Final   Triglycerides  Date Value Ref Range Status  06/21/2022 61 <150 mg/dL Final         Passed - Cr in normal range and within 360 days    Creatinine  Date Value Ref Range Status  09/16/2014 0.9 0.7 - 1.3 mg/dL Final   Creat  Date Value Ref Range Status  06/21/2022 0.73 0.70 - 1.28 mg/dL Final   Creatinine, Urine  Date Value Ref Range Status  06/24/2022 151 20 - 320 mg/dL Final         Passed - Patient is not pregnant      Passed - Valid  encounter within last 12 months    Recent Outpatient Visits           1 month ago Annual physical exam   [facility] Olin Hauser, DO   2 months ago Essential hypertension   [place], DO   4 months ago Acute rhinosinusitis   [facility] Olin [facility], DO   5 months ago Cervical spondylosis with radiculopathy   [facility] [place], Earley Abide, MD   6 months ago Supraspinatus syndrome, right   [place] [facility] [place], Earley Abide, MD       Future Appointments             In 5 days Ralene Bathe, MD [place]   In  2 weeks Jettie Booze, MD [place] at [facility], [place]   In 4 months Parks Ranger, Devonne Doughty, [facility], [place]   In 4 months Diamantina Providence, Herbert Seta, MD [place]

## 2022-08-18 NOTE — Telephone Encounter (Signed)
Patient sent a detailed mychart message to me about same issue and another question.  I attempted to call him back a few times today 08/18/22. He did not pick up. Could not reach him.  I responded to his mychart message, therefore I will close this phone call  Nobie Putnam, [facility] 08/18/2022, 5:03 PM

## 2022-08-18 NOTE — Telephone Encounter (Signed)
Change of pharmacy Requested Prescriptions  Pending Prescriptions Disp Refills   losartan (COZAAR) 50 MG tablet 90 tablet 1    Sig: Take 1 tablet (50 mg total) by mouth daily.     Cardiovascular:  Angiotensin Receptor Blockers Passed - 08/18/2022 11:47 AM      Passed - Cr in normal range and within 180 days    Creatinine  Date Value Ref Range Status  09/16/2014 0.9 0.7 - 1.3 mg/dL Final   Creat  Date Value Ref Range Status  06/21/2022 0.73 0.70 - 1.28 mg/dL Final   Creatinine, Urine  Date Value Ref Range Status  06/24/2022 151 20 - 320 mg/dL Final         Passed - K in normal range and within 180 days    Potassium  Date Value Ref Range Status  06/21/2022 4.0 3.5 - 5.3 mmol/L Final  09/16/2014 3.7 3.5 - 5.1 mEq/L Final         Passed - Patient is not pregnant      Passed - Last BP in normal range    BP Readings from Last 1 Encounters:  06/24/22 124/68         Passed - Valid encounter within last 6 months    Recent Outpatient Visits           1 month ago Annual physical exam   [facility] Olin Hauser, DO   2 months ago Essential hypertension   [place], DO   4 months ago Acute rhinosinusitis   [facility] Olin Hauser, DO   5 months ago Cervical spondylosis with radiculopathy   [facility] [place], Earley Abide, MD   6 months ago Supraspinatus syndrome, right   [place] [facility] [place], Earley Abide, MD       Future Appointments             In 5 days Ralene Bathe, MD [place]   In 2 weeks [place] Lack, Charlann Lange, MD [place] at [facility], [place]   In 4 months [place], Devonne Doughty, [facility], [place]   In 4 months [name], MD [facility]  [place]             rosuvastatin (CRESTOR) 5 MG tablet 90 tablet 3    Sig: Take 1 tablet (5 mg total) by mouth daily.     Cardiovascular:  Antilipid - Statins 2 Failed - 08/18/2022 11:47 AM      Failed - Lipid Panel in normal range within the last 12 months    Cholesterol  Date Value Ref Range Status  06/21/2022 128 <200 mg/dL Final   LDL Cholesterol (Calc)  Date Value Ref Range Status  06/21/2022 56 mg/dL (calc) Final    Comment:    Reference range: <100 . Desirable range <100 mg/dL for primary prevention;   <70 mg/dL for patients with CHD or diabetic patients  with > or = 2 CHD risk factors. Marland Kitchen LDL-C is now calculated using the Martin-Hopkins  calculation, which is a validated novel method providing  better accuracy than the Friedewald equation in the  estimation of LDL-C.  Cresenciano Genre et al. Annamaria Helling. 4818;563(14): 2061-2068  (http://education.[employer].com/faq/FAQ164)    HDL  Date Value Ref Range Status  06/21/2022  58 > OR = 40 mg/dL Final   Triglycerides  Date Value Ref Range Status  06/21/2022 61 <150 mg/dL Final         Passed - Cr in normal range and within 360 days    Creatinine  Date Value Ref Range Status  09/16/2014 0.9 0.7 - 1.3 mg/dL Final   Creat  Date Value Ref Range Status  06/21/2022 0.73 0.70 - 1.28 mg/dL Final   Creatinine, Urine  Date Value Ref Range Status  06/24/2022 151 20 - 320 mg/dL Final         Passed - Patient is not pregnant      Passed - Valid encounter within last 12 months    Recent Outpatient Visits           1 month ago Annual physical exam   [facility] Olin Hauser, DO   2 months ago Essential hypertension   [place], DO   4 months ago Acute rhinosinusitis   [facility] Olin Hauser, DO   5 months ago Cervical spondylosis with radiculopathy   [facility] [place], Earley Abide, MD   6 months ago Supraspinatus syndrome, right   [place] at [facility], Earley Abide, MD       Future Appointments             In 5 days Ralene Bathe, MD [place]   In 2 weeks [place], Charlann Lange, MD [place] at [facility], [place]   In 4 months Parks Ranger, Devonne Doughty, [facility], [place]   In 4 months Diamantina Providence, Herbert Seta, MD [place]

## 2022-08-18 NOTE — Telephone Encounter (Signed)
Medication Refill - Patient requesting a 90 day supply rosuvastatin (CRESTOR) 5 MG tablet and losartan (COZAAR) 50 MG. Patient has a week supply left and states medication continues to be denied stating refill not appropriate. Patient states [facility] transferred all medications to [place] but these 2 were denied.   .  Has the patient contacted their pharmacy? Yes.      Preferred Pharmacy (with phone number or street name):   [facility] Phone: [phone]  Fax: [phone]     Has the patient been seen for an appointment in the last year OR does the patient have an upcoming appointment? Yes.    Agent: Please be advised that RX refills may take up to 3 business days. We ask that you follow-up with your pharmacy.

## 2022-08-18 NOTE — Telephone Encounter (Signed)
[place] at [facility] called and spoke to [place], [facility] about the refill(s) rosuvastatin and losartan requested. She says both are showing received and she's able to refill them.

## 2022-08-18 NOTE — Addendum Note (Signed)
Addended by: Olin Hauser on: 08/18/2022 05:50 PM   Modules accepted: Orders

## 2022-08-19 ENCOUNTER — Other Ambulatory Visit (HOSPITAL_COMMUNITY): Payer: Self-pay

## 2022-08-19 ENCOUNTER — Other Ambulatory Visit: Payer: Self-pay

## 2022-08-19 ENCOUNTER — Ambulatory Visit: Payer: PPO | Admitting: Dermatology

## 2022-08-23 ENCOUNTER — Ambulatory Visit: Payer: PPO | Admitting: Dermatology

## 2022-08-23 VITALS — BP 121/75 | HR 103

## 2022-08-23 DIAGNOSIS — L820 Inflamed seborrheic keratosis: Secondary | ICD-10-CM

## 2022-08-23 DIAGNOSIS — L578 Other skin changes due to chronic exposure to nonionizing radiation: Secondary | ICD-10-CM | POA: Diagnosis not present

## 2022-08-23 DIAGNOSIS — L570 Actinic keratosis: Secondary | ICD-10-CM | POA: Diagnosis not present

## 2022-08-23 DIAGNOSIS — Z1283 Encounter for screening for malignant neoplasm of skin: Secondary | ICD-10-CM

## 2022-08-23 NOTE — Patient Instructions (Addendum)
Actinic keratoses are precancerous spots that appear secondary to cumulative UV radiation exposure/sun exposure over time. They are chronic with expected duration over 1 year. A portion of actinic keratoses will progress to squamous cell carcinoma of the skin. It is not possible to reliably predict which spots will progress to skin cancer and so treatment is recommended to prevent development of skin cancer.  Recommend daily broad spectrum sunscreen SPF 30+ to sun-exposed areas, reapply every 2 hours as needed.  Recommend staying in the shade or wearing long sleeves, sun glasses (UVA+UVB protection) and wide brim hats (4-inch brim around the entire circumference of the hat). Call for new or changing lesions.   Cryotherapy Aftercare  Wash gently with soap and water everyday.   Apply Vaseline and Band-Aid daily until healed.   Seborrheic Keratosis  What causes seborrheic keratoses? Seborrheic keratoses are harmless, common skin growths that first appear during adult life.  As time goes by, more growths appear.  Some people may develop a large number of them.  Seborrheic keratoses appear on both covered and uncovered body parts.  They are not caused by sunlight.  The tendency to develop seborrheic keratoses can be inherited.  They vary in color from skin-colored to gray, brown, or even black.  They can be either smooth or have a rough, warty surface.   Seborrheic keratoses are superficial and look as if they were stuck on the skin.  Under the microscope this type of keratosis looks like layers upon layers of skin.  That is why at times the top layer may seem to fall off, but the rest of the growth remains and re-grows.    Treatment Seborrheic keratoses do not need to be treated, but can easily be removed in the office.  Seborrheic keratoses often cause symptoms when they rub on clothing or jewelry.  Lesions can be in the way of shaving.  If they become inflamed, they can cause itching, soreness, or  burning.  Removal of a seborrheic keratosis can be accomplished by freezing, burning, or surgery. If any spot bleeds, scabs, or grows rapidly, please return to have it checked, as these can be an indication of a skin cancer.          Due to recent changes in healthcare laws, you may see results of your pathology and/or laboratory studies on MyChart before the doctors have had a chance to review them. We understand that in some cases there may be results that are confusing or concerning to you. Please understand that not all results are received at the same time and often the doctors may need to interpret multiple results in order to provide you with the best plan of care or course of treatment. Therefore, we ask that you please give us 2 business days to thoroughly review all your results before contacting the office for clarification. Should we see a critical lab result, you will be contacted sooner.   If You Need Anything After Your Visit  If you have any questions or concerns for your doctor, please call our main line at [phone] and press option 4 to reach your doctor's medical assistant. If no one answers, please leave a voicemail as directed and we will return your call as soon as possible. Messages left after 4 pm will be answered the following business day.   You may also send us a message via MyChart. We typically respond to MyChart messages within 1-2 business days.  For prescription refills, please ask your pharmacy   to contact our office. Our fax number is [phone].  If you have an urgent issue when the clinic is closed that cannot wait until the next business day, you can page your doctor at the number below.    Please note that while we do our best to be available for urgent issues outside of office hours, we are not available 24/7.   If you have an urgent issue and are unable to reach us, you may choose to seek medical care at your doctor's office, retail clinic,  urgent care center, or emergency room.  If you have a medical emergency, please immediately call 911 or go to the emergency department.  Pager Numbers  - Dr. Kowalski: [phone]  - Dr. Moye: [phone]  - Dr. Stewart: [phone]  In the event of inclement weather, please call our main line at [phone] for an update on the status of any delays or closures.  Dermatology Medication Tips: Please keep the boxes that topical medications come in in order to help keep track of the instructions about where and how to use these. Pharmacies typically print the medication instructions only on the boxes and not directly on the medication tubes.   If your medication is too expensive, please contact our office at [phone] option 4 or send us a message through MyChart.   We are unable to tell what your co-pay for medications will be in advance as this is different depending on your insurance coverage. However, we may be able to find a substitute medication at lower cost or fill out paperwork to get insurance to cover a needed medication.   If a prior authorization is required to get your medication covered by your insurance company, please allow us 1-2 business days to complete this process.  Drug prices often vary depending on where the prescription is filled and some pharmacies may offer cheaper prices.  The website www.goodrx.com contains coupons for medications through different pharmacies. The prices here do not account for what the cost may be with help from insurance (it may be cheaper with your insurance), but the website can give you the price if you did not use any insurance.  - You can print the associated coupon and take it with your prescription to the pharmacy.  - You may also stop by our office during regular business hours and pick up a GoodRx coupon card.  - If you need your prescription sent electronically to a different pharmacy, notify our office through [facility]  MyChart or by phone at [phone] option 4.     Si Usted Necesita Algo Despus de Su Visita  Tambin puede enviarnos un mensaje a travs de MyChart. Por lo general respondemos a los mensajes de MyChart en el transcurso de 1 a 2 das hbiles.  Para renovar recetas, por favor pida a su farmacia que se ponga en contacto con nuestra oficina. Nuestro nmero de fax es el [phone].  Si tiene un asunto urgente cuando la clnica est cerrada y que no puede esperar hasta el siguiente da hbil, puede llamar/localizar a su doctor(a) al nmero que aparece a continuacin.   Por favor, tenga en cuenta que aunque hacemos todo lo posible para estar disponibles para asuntos urgentes fuera del horario de oficina, no estamos disponibles las 24 horas del da, los 7 das de la semana.   Si tiene un problema urgente y no puede comunicarse con nosotros, puede optar por buscar atencin mdica  en el consultorio de su doctor(a), en una   clnica privada, en un centro de atencin urgente o en una sala de emergencias.  Si tiene una emergencia mdica, por favor llame inmediatamente al 911 o vaya a la sala de emergencias.  Nmeros de bper  - Dr. Kowalski: [phone]  - Dra. Moye: [phone]  - Dra. Stewart: [phone]  En caso de inclemencias del tiempo, por favor llame a nuestra lnea principal al [phone] para una actualizacin sobre el estado de cualquier retraso o cierre.  Consejos para la medicacin en dermatologa: Por favor, guarde las cajas en las que vienen los medicamentos de uso tpico para ayudarle a seguir las instrucciones sobre dnde y cmo usarlos. Las farmacias generalmente imprimen las instrucciones del medicamento slo en las cajas y no directamente en los tubos del medicamento.   Si su medicamento es muy caro, por favor, pngase en contacto con nuestra oficina llamando al [phone] y presione la opcin 4 o envenos un mensaje a travs de MyChart.   No podemos decirle cul  ser su copago por los medicamentos por adelantado ya que esto es diferente dependiendo de la cobertura de su seguro. Sin embargo, es posible que podamos encontrar un medicamento sustituto a menor costo o llenar un formulario para que el seguro cubra el medicamento que se considera necesario.   Si se requiere una autorizacin previa para que su compaa de seguros cubra su medicamento, por favor permtanos de 1 a 2 das hbiles para completar este proceso.  Los precios de los medicamentos varan con frecuencia dependiendo del lugar de dnde se surte la receta y alguna farmacias pueden ofrecer precios ms baratos.  El sitio web www.goodrx.com tiene cupones para medicamentos de diferentes farmacias. Los precios aqu no tienen en cuenta lo que podra costar con la ayuda del seguro (puede ser ms barato con su seguro), pero el sitio web puede darle el precio si no utiliz ningn seguro.  - Puede imprimir el cupn correspondiente y llevarlo con su receta a la farmacia.  - Tambin puede pasar por nuestra oficina durante el horario de atencin regular y recoger una tarjeta de cupones de GoodRx.  - Si necesita que su receta se enve electrnicamente a una farmacia diferente, informe a nuestra oficina a travs de MyChart de [place] o por telfono llamando al [phone] y presione la opcin 4.  

## 2022-08-23 NOTE — Progress Notes (Signed)
Follow-Up Visit   Subjective  Isaac Weber is a 77 y.o. male who presents for the following: Follow-up (Ak and isk follow up, reports spots at elbows, right hip, hx of asks at face, arms and hand and isk at left pretibia that needs treatment. ). The patient presents for Total-Body Skin Exam (TBSE) for skin cancer screening and mole check.  The patient has spots, moles and lesions to be evaluated, some may be new or changing and the patient has concerns that these could be cancer.  The following portions of the chart were reviewed this encounter and updated as appropriate:  Tobacco  Allergies  Meds  Problems  Med Hx  Surg Hx  Fam Hx     Review of Systems: No other skin or systemic complaints except as noted in HPI or Assessment and Plan.  Objective  Well appearing patient in no apparent distress; mood and affect are within normal limits.  A full examination was performed including scalp, head, eyes, ears, nose, lips, neck, chest, axillae, abdomen, back, buttocks, bilateral upper extremities, bilateral lower extremities, hands, feet, fingers, toes, fingernails, and toenails. All findings within normal limits unless otherwise noted below.  face/ears/hands/arms x 16 (16) Erythematous thin papules/macules with gritty scale.   left pretibia x 1, b/l elbows x 20, right hip x 1 (22) Erythematous stuck-on, waxy papule or plaque   Assessment & Plan  Actinic keratosis (16) face/ears/hands/arms x 16 Actinic keratoses are precancerous spots that appear secondary to cumulative UV radiation exposure/sun exposure over time. They are chronic with expected duration over 1 year. A portion of actinic keratoses will progress to squamous cell carcinoma of the skin. It is not possible to reliably predict which spots will progress to skin cancer and so treatment is recommended to prevent development of skin cancer.  Recommend daily broad spectrum sunscreen SPF 30+ to sun-exposed areas, reapply every 2  hours as needed.  Recommend staying in the shade or wearing long sleeves, sun glasses (UVA+UVB protection) and wide brim hats (4-inch brim around the entire circumference of the hat). Call for new or changing lesions.  Destruction of lesion - face/ears/hands/arms x 16 Complexity: simple   Destruction method: cryotherapy   Informed consent: discussed and consent obtained   Timeout:  patient name, date of birth, surgical site, and procedure verified Lesion destroyed using liquid nitrogen: Yes   Region frozen until ice ball extended beyond lesion: Yes   Outcome: patient tolerated procedure well with no complications   Post-procedure details: wound care instructions given   Additional details:  Prior to procedure, discussed risks of blister formation, small wound, skin dyspigmentation, or rare scar following cryotherapy. Recommend Vaseline ointment to treated areas while healing.  Inflamed seborrheic keratosis (22) left pretibia x 1, b/l elbows x 20, right hip x 1 Symptomatic, irritating, patient would like treated. Destruction of lesion - left pretibia x 1, b/l elbows x 20, right hip x 1 Complexity: simple   Destruction method: cryotherapy   Informed consent: discussed and consent obtained   Timeout:  patient name, date of birth, surgical site, and procedure verified Lesion destroyed using liquid nitrogen: Yes   Region frozen until ice ball extended beyond lesion: Yes   Outcome: patient tolerated procedure well with no complications   Post-procedure details: wound care instructions given   Additional details:  Prior to procedure, discussed risks of blister formation, small wound, skin dyspigmentation, or rare scar following cryotherapy. Recommend Vaseline ointment to treated areas while healing.  Actinic Damage -  chronic, secondary to cumulative UV radiation exposure/sun exposure over time - diffuse scaly erythematous macules with underlying dyspigmentation - Recommend daily broad  spectrum sunscreen SPF 30+ to sun-exposed areas, reapply every 2 hours as needed.  - Recommend staying in the shade or wearing long sleeves, sun glasses (UVA+UVB protection) and wide brim hats (4-inch brim around the entire circumference of the hat). - Call for new or changing lesions.  Return in about 6 months (around 02/21/2023) for tbse.  IRuthell Rummage, CMA, am acting as scribe for Sarina Ser, MD. Documentation: I have reviewed the above documentation for accuracy and completeness, and I agree with the above.  Sarina Ser, MD

## 2022-08-26 ENCOUNTER — Encounter: Payer: Self-pay | Admitting: Physical Therapy

## 2022-08-27 ENCOUNTER — Encounter: Payer: Self-pay | Admitting: Dermatology

## 2022-08-31 ENCOUNTER — Other Ambulatory Visit (HOSPITAL_COMMUNITY): Payer: Self-pay

## 2022-09-02 NOTE — Progress Notes (Signed)
Cardiology Office Note   Date:  09/03/2022   ID:  Isaac, Weber 05-04-1946, MRN [id number]  PCP:  Isaac Hauser, DO    No chief complaint on file.  Coronary artery calcification  Wt Readings from Last 3 Encounters:  09/03/22 165 lb 3.2 oz (74.9 kg)  06/24/22 168 lb (76.2 kg)  06/24/22 168 lb (76.2 kg)       History of Present Illness: Isaac Weber is a 77 y.o. male with coronary artery calcification noted on a chest CT scan in August 2020.  Results showed: "IMPRESSION: 1. No evidence of interstitial lung disease. 2. Mild paraseptal emphysema, mild diffuse bronchial wall thickening and mild saber sheath trachea configuration, suggesting COPD. 3. Solitary left upper lobe 4 mm solid pulmonary nodule. No follow-up needed if patient is low-risk. Non-contrast chest CT can be considered in 12 months if patient is high-risk. This recommendation follows the consensus statement: Guidelines for Management of Incidental Pulmonary Nodules Detected on CT Images:From the Fleischner Society 2017; published online before print (10.1148/radiol.SG:5268862). 4. 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 5. Two vessel coronary atherosclerosis. Left anterior descending and left circumflex coronary atherosclerosis.   Plan in 03/2019 was :"Coronary calcification: No angina.  COntinue aggressive secondary prevention.  LDL 69. TG 52.  He will let us know about any exertional chest pain or change in exercise tolerance.  Will wait for result of pancreas CT scan and see what further cardiac treatment may be needed. 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 These measure up to 1.6 cm and appear unilocular without internal enhancement. There  appearance is nonspecific but favors of benign etiology. According to consensus criteria follow-up imaging is indicated. Repeat without and with contrast MRI of the pancreas in 6 months is recommended. "    Cough that he had for a year has resolved in 2022. He was treated for a sinus infection. "  Eating lots of fiber and salads on a regular basis.  Avoiding carbohydrates.  Denies : Chest pain. Dizziness. Leg edema. Nitroglycerin use. Orthopnea. Palpitations. Paroxysmal nocturnal dyspnea. Shortness of breath. Syncope.    Past Medical History:  Diagnosis Date   Abnormal chest CT    Actinic keratosis    Allergic rhinitis, unspecified    Anemia    Aortic atherosclerosis (HCC)    Arthritis    BPH (benign prostatic hyperplasia)    COPD (chronic obstructive pulmonary disease) (HCC)    Coronary artery disease    Cough variant asthma    Diabetes mellitus without complication (HCC)    Glaucoma    Hypertension    Other chronic pain    Pancreatic lesion    Pneumonia    Pure hypercholesterolemia, unspecified    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 WD SCC, ulcerated. [facility]: [facility] Rhonda W 04/29/2020    Past Surgical History:  Procedure Laterality Date   APPLICATION OF INTRAOPERATIVE CT SCAN N/A 05/12/2022   Procedure: APPLICATION OF INTRAOPERATIVE CT SCAN;  Surgeon: Meade Maw, MD;  Location: [facility] ORS;  Service: Neurosurgery;  Laterality: N/A;   BACK SURGERY     BRONCHIAL WASHINGS  11/26/2021   Procedure: BRONCHIAL WASHINGS;  Surgeon: Brand Males, MD;  Location: [facility] ENDOSCOPY;  Service: Endoscopy;;   BUNIONECTOMY Left 2003   hammer toe as well   BUNIONECTOMY WITH HAMMERTOE RECONSTRUCTION Left 2013    repeat   CERVICAL DISC SURGERY     COLONOSCOPY WITH PROPOFOL N/A 04/30/2021   Procedure: COLONOSCOPY WITH PROPOFOL;  Surgeon: Jonathon Bellows, MD;  [facility];  Service: Gastroenterology;  Laterality: N/A;   GREEN LIGHT LASER TURP (TRANSURETHRAL RESECTION OF PROSTATE  2010   laser, shrink prostate   KNEE ARTHROSCOPY Right 1991   LEG SURGERY Left    BENIGN BONE TUMOR   LUMBAR [place] SURGERY  2010   discectomy   LUMBAR DISC SURGERY  2011   LUMBAR LAMINECTOMY/DECOMPRESSION MICRODISCECTOMY Right 05/12/2022   Procedure: RIGHT L5-S1 DISCECTOMY;  Surgeon: Meade Maw, MD;  Location: [facility] ORS;  Service: Neurosurgery;  Laterality: Right;   PROSTATE SURGERY  2002   shrink prostate   ROTATOR CUFF REPAIR Left 1999   debride, remove bonespur   ROTATOR CUFF REPAIR Right 1996   TONSILLECTOMY     TRANSFORAMINAL LUMBAR INTERBODY FUSION W/ MIS 1 LEVEL Right 05/12/2022   Procedure: RIGHT L4-5 MINIMALLY INVASIVE (MIS) TRANSFORAMINAL LUMBAR INTERBODY FUSION (TLIF);  Surgeon: Meade Maw, MD;  Location: [facility] ORS;  Service: Neurosurgery;  Laterality: Right;   VIDEO BRONCHOSCOPY N/A 11/26/2021   Procedure: VIDEO BRONCHOSCOPY WITHOUT FLUORO;  Surgeon: Brand Males, MD;  Location: [facility] ENDOSCOPY;  Service: Endoscopy;  Laterality: N/A;  for chroni ccough BAL     Current Outpatient Medications  Medication Sig Dispense Refill   albuterol (VENTOLIN HFA) 108 (90 Base) MCG/ACT inhaler Inhale 1-2 puffs into the lungs every 4 (four) hours as needed for wheezing or shortness of breath (cough). 1 each 2   amLODipine (NORVASC) 5 MG tablet Take 1 tablet (5 mg total) by mouth daily. 90 tablet 1   Blood Glucose Monitoring Suppl (ONE TOUCH ULTRA 2) w/Device KIT Use to check blood sugar up to 2 times daily 1 kit 0   celecoxib (CELEBREX) 200 MG capsule Take 1 capsule (200 mg total) by mouth every 12 (twelve) hours. 60 capsule 0   ferrous sulfate 325 (65 FE) MG tablet Take 325 mg by mouth daily with  breakfast.     Glucosamine-Chondroitin (GLUCOSAMINE CHONDR COMPLEX PO) Take 1 capsule by mouth 2 (two) times daily.     hydrochlorothiazide (HYDRODIURIL) 12.5 MG tablet Take 1 tablet (12.5 mg total) by mouth daily. 90 tablet 1   ketoconazole (NIZORAL) 2 % shampoo SHAMPOO INTO SCALP, LET SET FOR 10 MINUTES THEN WASH OFF, USE 3 TIMES PER WEEK 120 mL 3   losartan (COZAAR) 50 MG tablet Take 1 tablet (50 mg total) by mouth daily. 90 tablet 3   metFORMIN (GLUCOPHAGE-XR) 500 MG 24 hr tablet Take 1 tablet (500 mg total) by mouth daily with breakfast. 90 tablet 3   Multiple Minerals-Vitamins (CAL MAG ZINC +D3 PO) Take 1 tablet by mouth every evening.     Multiple Vitamin (MULTIVITAMIN WITH MINERALS) TABS tablet Take 1 tablet by mouth daily.     ONETOUCH ULTRA test strip Use to check blood sugar up to twice per day 200 each 5   rosuvastatin (CRESTOR) 5 MG tablet Take 1 tablet (5 mg total) by mouth daily. 90 tablet 3   tamsulosin (FLOMAX) 0.4  MG CAPS capsule Take 1 capsule (0.4 mg total) by mouth daily. 90 capsule 1   No current facility-administered medications for this visit.    Allergies:   Morphine and Morphine and related    Social History:  The patient  reports that he quit smoking about 50 years ago. His smoking use included cigarettes. He has a 78.00 pack-year smoking history. He has been exposed to tobacco smoke. He has never used smokeless tobacco. He reports current alcohol use of about 2.0 standard drinks of alcohol per week. He reports that he does not use drugs.   Family History:  The patient's family history includes Emphysema (age of onset: 104) in his father; Heart disease (age of onset: 51) in his mother; Heart disease (age of onset: 55) in his brother.    ROS:  Please see the history of present illness.   Otherwise, review of systems are positive for shoulder pain- chronic.   All other systems are reviewed and negative.    PHYSICAL EXAM: VS:  BP 124/62   Pulse 92   Ht 5' 9"$  (1.753  m)   Wt 165 lb 3.2 oz (74.9 kg)   SpO2 97%   BMI 24.40 kg/m  , BMI Body mass index is 24.4 kg/m. GEN: Well nourished, well developed, in no acute distress HEENT: normal Neck: no JVD, carotid bruits, or masses Cardiac: RRR; no murmurs, rubs, or gallops,no edema  Respiratory:  clear to auscultation bilaterally, normal work of breathing GI: soft, nontender, nondistended, + BS MS: no deformity or atrophy Skin: warm and dry, no rash Neuro:  Strength and sensation are intact Psych: euthymic mood, full affect   EKG:   The ekg ordered in 10/23 demonstrates NSR, RBBB, PRWP   Recent Labs: 01/15/2022: Pro B Natriuretic peptide (BNP) 13.0 06/21/2022: ALT 16; BUN 20; Creat 0.73; Hemoglobin 13.5; Platelets 200; Potassium 4.0; Sodium 138; TSH 1.79   Lipid Panel    Component Value Date/Time   CHOL 128 06/21/2022 0810   TRIG 61 06/21/2022 0810   HDL 58 06/21/2022 0810   CHOLHDL 2.2 06/21/2022 0810   LDLCALC 56 06/21/2022 0810     Other studies Reviewed: Additional studies/ records that were reviewed today with results demonstrating: labs reviewed.   ASSESSMENT AND PLAN:  Coronary artery calcification: No angina.  Continue aggressive secondary prevention.  Hypertension: The current medical regimen is effective;  continue present plan and medications. Diabetes: A1c 6.3.  Continue the metformin.  Already eats a healthy diet. Hyperlipidemia: In 2023 total cholesterol 128 HDL 58 LDL 56 triglycerides 61   Current medicines are reviewed at length with the patient today.  The patient concerns regarding his medicines were addressed.  The following changes have been made:  No change  Labs/ tests ordered today include:  No orders of the defined types were placed in this encounter.   Recommend 150 minutes/week of aerobic exercise Low fat, low carb, high fiber diet recommended  Disposition:   FU in 1 year   Signed, Larae Grooms, MD  09/03/2022 1:34 PM    [facility]  [facility] [address], [place], [place]  [address] Phone: [phone]; Fax: [phone]

## 2022-09-03 ENCOUNTER — Encounter: Payer: Self-pay | Admitting: Interventional Cardiology

## 2022-09-03 ENCOUNTER — Ambulatory Visit: Payer: PPO | Attending: Interventional Cardiology | Admitting: Interventional Cardiology

## 2022-09-03 VITALS — BP 124/62 | HR 92 | Ht 69.0 in | Wt 165.2 lb

## 2022-09-03 DIAGNOSIS — I10 Essential (primary) hypertension: Secondary | ICD-10-CM

## 2022-09-03 DIAGNOSIS — E782 Mixed hyperlipidemia: Secondary | ICD-10-CM | POA: Diagnosis not present

## 2022-09-03 DIAGNOSIS — E119 Type 2 diabetes mellitus without complications: Secondary | ICD-10-CM | POA: Diagnosis not present

## 2022-09-03 DIAGNOSIS — I2510 Atherosclerotic heart disease of native coronary artery without angina pectoris: Secondary | ICD-10-CM

## 2022-09-03 NOTE — Patient Instructions (Signed)

## 2022-09-14 ENCOUNTER — Other Ambulatory Visit (HOSPITAL_COMMUNITY): Payer: Self-pay

## 2022-09-20 ENCOUNTER — Encounter: Payer: Self-pay | Admitting: Family Medicine

## 2022-09-20 DIAGNOSIS — E1169 Type 2 diabetes mellitus with other specified complication: Secondary | ICD-10-CM

## 2022-09-20 DIAGNOSIS — M5136 Other intervertebral disc degeneration, lumbar region: Secondary | ICD-10-CM | POA: Diagnosis not present

## 2022-09-20 DIAGNOSIS — M9903 Segmental and somatic dysfunction of lumbar region: Secondary | ICD-10-CM | POA: Diagnosis not present

## 2022-09-20 DIAGNOSIS — M9905 Segmental and somatic dysfunction of pelvic region: Secondary | ICD-10-CM | POA: Diagnosis not present

## 2022-09-20 DIAGNOSIS — M5432 Sciatica, left side: Secondary | ICD-10-CM | POA: Diagnosis not present

## 2022-09-21 MED ORDER — METFORMIN HCL ER 500 MG PO TB24
1000.0000 mg | ORAL_TABLET | Freq: Every day | ORAL | 3 refills | Status: DC
  Filled 2022-09-21: qty 180, 90d supply, fill #0
  Filled 2022-09-23: qty 60, 30d supply, fill #0
  Filled 2022-10-18: qty 180, 90d supply, fill #1
  Filled 2023-01-16: qty 180, 90d supply, fill #2
  Filled 2023-04-16: qty 180, 90d supply, fill #3

## 2022-09-22 ENCOUNTER — Other Ambulatory Visit (HOSPITAL_COMMUNITY): Payer: Self-pay

## 2022-09-22 DIAGNOSIS — M5432 Sciatica, left side: Secondary | ICD-10-CM | POA: Diagnosis not present

## 2022-09-22 DIAGNOSIS — M9905 Segmental and somatic dysfunction of pelvic region: Secondary | ICD-10-CM | POA: Diagnosis not present

## 2022-09-22 DIAGNOSIS — M5136 Other intervertebral disc degeneration, lumbar region: Secondary | ICD-10-CM | POA: Diagnosis not present

## 2022-09-22 DIAGNOSIS — M9903 Segmental and somatic dysfunction of lumbar region: Secondary | ICD-10-CM | POA: Diagnosis not present

## 2022-09-23 ENCOUNTER — Other Ambulatory Visit (HOSPITAL_COMMUNITY): Payer: Self-pay

## 2022-09-23 ENCOUNTER — Other Ambulatory Visit: Payer: Self-pay

## 2022-10-10 ENCOUNTER — Encounter: Payer: Self-pay | Admitting: Family Medicine

## 2022-10-11 ENCOUNTER — Encounter: Payer: Self-pay | Admitting: Family Medicine

## 2022-10-11 ENCOUNTER — Other Ambulatory Visit (HOSPITAL_COMMUNITY): Payer: Self-pay

## 2022-10-11 ENCOUNTER — Ambulatory Visit (INDEPENDENT_AMBULATORY_CARE_PROVIDER_SITE_OTHER): Payer: PPO | Admitting: Family Medicine

## 2022-10-11 VITALS — BP 120/64 | HR 84 | Temp 96.8°F | Wt 167.0 lb

## 2022-10-11 DIAGNOSIS — I872 Venous insufficiency (chronic) (peripheral): Secondary | ICD-10-CM

## 2022-10-11 DIAGNOSIS — R600 Localized edema: Secondary | ICD-10-CM

## 2022-10-11 NOTE — Progress Notes (Signed)
Subjective:    Patient ID: Isaac Weber, male    DOB: [birth year], 77 y.o.   MRN: [id number]  Isaac Weber is a 77 y.o. male presenting on 10/11/2022 for Edema   HPI  Lower Extremity Edema  Recent mychart message yesterday 10/10/22 with report of leg and foot swelling  Left worse than right Tried compression socks for 1-2 days and did not seem to work much, not tried elevation yet  Recently started Metformin dose increase, but this should not be related He is on Amlodipine 5mg  daily but has been for years and has not caused problem  Recent golfing again 6 weeks ago did 9 holes, and then 18 holes twice since then He has not been walking as much lately. Less time on his feet otherwise  He has no significant leg pain or numbness or tingling or rash or discoloration  Recently seen Cardiology 08/2022, next visit yearly  Last ECHO done 2022.      06/24/2022    9:05 AM 04/16/2022    8:59 AM 04/14/2022    1:41 PM  Depression screen PHQ 2/9  Decreased Interest 0 0 0  Down, Depressed, Hopeless 0 0 0  PHQ - 2 Score 0 0 0  Altered sleeping 0 0   Tired, decreased energy 0 0   Change in appetite 0 0   Feeling bad or failure about yourself  0 0   Trouble concentrating 0 0   Moving slowly or fidgety/restless 0 0   Suicidal thoughts 0 0   PHQ-9 Score 0 0   Difficult doing work/chores Not difficult at all Not difficult at all     Social History   Tobacco Use   Smoking status: Former    Packs/day: 3.00    Years: 26.00    Additional pack years: 0.00    Total pack years: 78.00    Types: Cigarettes    Quit date: 09/17/1971    Years since quitting: 51.1    Passive exposure: Past   Smokeless tobacco: Never  Vaping Use   Vaping Use: Never used  Substance Use Topics   Alcohol use: Yes    Alcohol/week: 2.0 standard drinks of alcohol    Types: 2 Standard drinks or equivalent per week   Drug use: Never    Review of Systems Per HPI unless specifically indicated above      Objective:    BP 120/64 (BP Location: Right Arm, Patient Position: Sitting, Cuff Size: Normal)   Pulse 84   Temp (!) 96.8 F (36 C) (Temporal)   Wt 167 lb (75.8 kg)   BMI 24.66 kg/m   Wt Readings from Last 3 Encounters:  10/11/22 167 lb (75.8 kg)  09/03/22 165 lb 3.2 oz (74.9 kg)  06/24/22 168 lb (76.2 kg)    Physical Exam Vitals and nursing note reviewed.  Constitutional:      General: He is not in acute distress.    Appearance: He is well-developed. He is not diaphoretic.     Comments: Well-appearing, comfortable, cooperative  HENT:     Head: Normocephalic and atraumatic.  Eyes:     General:        Right eye: No discharge.        Left eye: No discharge.     Conjunctiva/sclera: Conjunctivae normal.  Neck:     Thyroid: No thyromegaly.  Cardiovascular:     Rate and Rhythm: Normal rate and regular rhythm.     Pulses: Normal pulses.  Heart sounds: Normal heart sounds. No murmur heard. Pulmonary:     Effort: Pulmonary effort is normal. No respiratory distress.     Breath sounds: Normal breath sounds. No wheezing or rales.  Musculoskeletal:        General: Normal range of motion.     Cervical back: Normal range of motion and neck supple.     Right lower leg: Edema (+1 pitting edema general, non focal lowe rext) present.     Left lower leg: Edema (+1-2 pitting edema foot and lower ankle. no erythema non tender) present.  Lymphadenopathy:     Cervical: No cervical adenopathy.  Skin:    General: Skin is warm and dry.     Findings: No erythema or rash.  Neurological:     Mental Status: He is alert and oriented to person, place, and time. Mental status is at baseline.  Psychiatric:        Behavior: Behavior normal.     Comments: Well groomed, good eye contact, normal speech and thoughts    Results for orders placed or performed in visit on 06/24/22  Urine Microalbumin w/creat. ratio  Result Value Ref Range   Creatinine, Urine 151 20 - 320 mg/dL   Microalb, Ur 0.5  mg/dL   Microalb Creat Ratio 3 <30 mcg/mg creat      Assessment & Plan:   Problem List Items Addressed This Visit     Lower extremity edema - Primary   Other Visit Diagnoses     Venous insufficiency           No orders of the defined types were placed in this encounter.  Swelling in both legs, today appears most likely venous insufficiency swelling. This means fluid is pooling due to gravity in the lower extremities, can have weak vein valves etc.  I am not sure exactly why this is occurring now, but it usually is a gradual chronic problem.  Also - I think the Amlodipine could be worsening the swelling.  May try HOLDING Amlodipine 5mg  for about a few days to a week, and see if the swelling goes away.  If that did not solve the issue, can focus more on treatment measures such as RICE therapy  Use RICE therapy: - R - Rest / relative rest with activity modification avoid overuse of joint - I - Ice packs (make sure you use a towel or sock / something to protect skin) - C - Compression with stockings / socks to apply pressure and reduce swelling allowing more support - E - Elevation - if significant swelling, lift leg above heart level (toes above your nose) to help reduce swelling, most helpful at night after day of being on your feet  Unlikely to be DVT based on presentation.  Last ECHO for heart done 2022, we can reconsider this test if interested, I would suggest contacting Cardiology or I can help connect you as well.  May dial back some of the fluid intake, just slightly.  Future if needed Chemistry, BNP, Future ECHO?   Follow up plan: Return if symptoms worsen or fail to improve.   Nobie Putnam, [facility] 10/11/2022, 1:31 PM

## 2022-10-11 NOTE — Patient Instructions (Addendum)
Thank you for coming to the office today.  Swelling in both legs, today appears most likely venous insufficiency swelling. This means fluid is pooling due to gravity in the lower extremities, can have weak vein valves etc.  I am not sure exactly why this is occurring now, but it usually is a gradual chronic problem.  Also - I think the Amlodipine could be worsening the swelling.  May try HOLDING Amlodipine 5mg  for about a few days to a week, and see if the swelling goes away.  If that did not solve the issue, can focus more on treatment measures such as RICE therapy  Use RICE therapy: - R - Rest / relative rest with activity modification avoid overuse of joint - I - Ice packs (make sure you use a towel or sock / something to protect skin) - C - Compression with stockings / socks to apply pressure and reduce swelling allowing more support - E - Elevation - if significant swelling, lift leg above heart level (toes above your nose) to help reduce swelling, most helpful at night after day of being on your feet  Unlikely to be DVT based on presentation.  Last ECHO for heart done 2022, we can reconsider this test if interested, I would suggest contacting Cardiology or I can help connect you as well.  May dial back some of the fluid intake, just slightly.   Please schedule a Follow-up Appointment to: Return if symptoms worsen or fail to improve.  If you have any other questions or concerns, please feel free to call the office or send a message through [place]. You may also schedule an earlier appointment if necessary.  Additionally, you may be receiving a survey about your experience at our office within a few days to 1 week by e-mail or mail. We value your feedback.  Isaac Putnam, DO [place]

## 2022-10-12 ENCOUNTER — Other Ambulatory Visit (HOSPITAL_COMMUNITY): Payer: Self-pay

## 2022-10-12 ENCOUNTER — Other Ambulatory Visit: Payer: Self-pay

## 2022-10-15 ENCOUNTER — Encounter: Payer: Self-pay | Admitting: Family Medicine

## 2022-10-18 ENCOUNTER — Other Ambulatory Visit (HOSPITAL_COMMUNITY): Payer: Self-pay

## 2022-10-28 ENCOUNTER — Other Ambulatory Visit: Payer: Self-pay | Admitting: Dermatology

## 2022-10-28 ENCOUNTER — Other Ambulatory Visit (HOSPITAL_COMMUNITY): Payer: Self-pay

## 2022-10-28 ENCOUNTER — Encounter: Payer: Self-pay | Admitting: Family Medicine

## 2022-10-28 ENCOUNTER — Ambulatory Visit (INDEPENDENT_AMBULATORY_CARE_PROVIDER_SITE_OTHER): Payer: PPO | Admitting: Family Medicine

## 2022-10-28 ENCOUNTER — Other Ambulatory Visit: Payer: Self-pay

## 2022-10-28 VITALS — BP 126/60 | HR 105 | Ht 69.0 in | Wt 169.0 lb

## 2022-10-28 DIAGNOSIS — M25812 Other specified joint disorders, left shoulder: Secondary | ICD-10-CM

## 2022-10-28 MED ORDER — KETOCONAZOLE 2 % EX SHAM
MEDICATED_SHAMPOO | CUTANEOUS | 10 refills | Status: DC
  Filled 2022-10-28: qty 120, 30d supply, fill #0

## 2022-10-28 MED ORDER — PREDNISONE 50 MG PO TABS
50.0000 mg | ORAL_TABLET | Freq: Every day | ORAL | 0 refills | Status: DC
  Filled 2022-10-28: qty 7, 7d supply, fill #0

## 2022-11-12 ENCOUNTER — Other Ambulatory Visit (HOSPITAL_COMMUNITY): Payer: Self-pay

## 2022-11-14 DIAGNOSIS — M25812 Other specified joint disorders, left shoulder: Secondary | ICD-10-CM | POA: Insufficient documentation

## 2022-11-14 NOTE — Patient Instructions (Addendum)
-   Take prednisone for course - Activity as tolerated - If symptoms persist, contact us for next steps

## 2022-11-14 NOTE — Assessment & Plan Note (Signed)
RHD with acute on chronic left shoulder pain in setting of cervical spondylosis s/p surgical management and prior chronic right shoulder pain. Has noted gradual worsening.  Examination consistent with focality to the [facility] without overt weakness, primarily supraspinatus.  - Start prednisone course - Activity limits - If symptoms persist, he is to contact and we will consider advance to subacromial cortisone injection

## 2022-11-14 NOTE — Progress Notes (Signed)
     Primary Care / Sports Medicine Office Visit  Patient Information:  Patient ID: Isaac Weber, male DOB: [birth year] Age: 77 y.o. MRN: [id number]   Isaac Weber is a pleasant 77 y.o. male presenting with the following:  Chief Complaint  Patient presents with   Shoulder Pain    Left shoulder    Vitals:   10/28/22 0937  BP: 126/60  Pulse: (!) 105  SpO2: 100%   Vitals:   10/28/22 0937  Weight: 169 lb (76.7 kg)  Height:  (1.753 m)   Body mass index is 24.96 kg/m.  No results found.   Independent interpretation of notes and tests performed by another provider:   None  Procedures performed:   None  Pertinent History, Exam, Impression, and Recommendations:   Ardell was seen today for shoulder pain.  Impingement of left shoulder Assessment & Plan: RHD with acute on chronic left shoulder pain in setting of cervical spondylosis s/p surgical management and prior chronic right shoulder pain. Has noted gradual worsening.  Examination consistent with focality to the [facility] without overt weakness, primarily supraspinatus.  - Start prednisone course - Activity limits - If symptoms persist, he is to contact and we will consider advance to subacromial cortisone injection   Other orders -     predniSONE; Take 1 tablet (50 mg total) by mouth daily.  Dispense: 7 tablet; Refill: 0     Orders & Medications Meds ordered this encounter  Medications   predniSONE (DELTASONE) 50 MG tablet    Sig: Take 1 tablet (50 mg total) by mouth daily.    Dispense:  7 tablet    Refill:  0   No orders of the defined types were placed in this encounter.    No follow-ups on file.     Jerrol Banana, MD, [facility]   Primary Care Sports Medicine Primary Care and Sports Medicine at [facility]

## 2022-11-18 LAB — HM DIABETES EYE EXAM

## 2022-11-20 ENCOUNTER — Other Ambulatory Visit (HOSPITAL_COMMUNITY): Payer: Self-pay

## 2022-11-25 ENCOUNTER — Encounter: Payer: Self-pay | Admitting: Family Medicine

## 2022-11-29 ENCOUNTER — Encounter: Payer: Self-pay | Admitting: Family Medicine

## 2022-11-30 ENCOUNTER — Other Ambulatory Visit (INDEPENDENT_AMBULATORY_CARE_PROVIDER_SITE_OTHER): Payer: PPO | Admitting: Radiology

## 2022-11-30 ENCOUNTER — Ambulatory Visit (INDEPENDENT_AMBULATORY_CARE_PROVIDER_SITE_OTHER): Payer: PPO | Admitting: Family Medicine

## 2022-11-30 ENCOUNTER — Other Ambulatory Visit: Payer: Self-pay | Admitting: Radiology

## 2022-11-30 ENCOUNTER — Encounter: Payer: Self-pay | Admitting: Family Medicine

## 2022-11-30 VITALS — BP 138/70 | HR 100 | Ht 69.0 in | Wt 164.0 lb

## 2022-11-30 DIAGNOSIS — M25812 Other specified joint disorders, left shoulder: Secondary | ICD-10-CM

## 2022-11-30 MED ORDER — TRIAMCINOLONE ACETONIDE 40 MG/ML IJ SUSP
40.0000 mg | Freq: Once | INTRAMUSCULAR | Status: AC
  Administered 2022-11-30: 40 mg via INTRAMUSCULAR

## 2022-11-30 NOTE — Patient Instructions (Signed)
You have just been given a cortisone injection to reduce pain and inflammation. After the injection you may notice immediate relief of pain as a result of the Lidocaine. It is important to rest the area of the injection for 24 to 48 hours after the injection. There is a possibility of some temporary increased discomfort and swelling for up to 72 hours until the cortisone begins to work. If you do have pain, simply rest the joint and use ice. If you can tolerate over the counter medications, you can try Tylenol, Aleve, or Advil for added relief per package instructions. -Relative rest for 2 days and gradual return to normal activity in a stepwise manner - Contact us at the 2-week mark or beyond for any persistent symptoms - Otherwise, contact for questions and follow-up as needed

## 2022-11-30 NOTE — Progress Notes (Signed)
     Primary Care / Sports Medicine Office Visit  Patient Information:  Patient ID: Isaac Weber, male DOB: 05/16/1946 Age: 77 y.o. MRN: [id number]   Isaac Weber is a pleasant 76 y.o. male presenting with the following:  Chief Complaint  Patient presents with   Impingement of left shoulder    Vitals:   11/30/22 1316  BP: 138/70  Pulse: 100  SpO2: 96%   Vitals:   11/30/22 1316  Weight: 164 lb (74.4 kg)  Height: 5\' 9"  (1.753 m)   Body mass index is 24.22 kg/m.  No results found.   Independent interpretation of notes and tests performed by another provider:   None  Procedures performed:   Procedure:  Injection of left subacromial space under ultrasound guidance. Ultrasound guidance utilized for in-plane approach to left subacromial space Samsung HS60 device utilized with permanent recording / reporting. Verbal informed consent obtained and verified. Skin prepped in a sterile fashion. Ethyl chloride for topical local analgesia.  Completed without difficulty and tolerated well. Medication: triamcinolone acetonide 40 mg/mL suspension for injection 1 mL total and 2 mL lidocaine 1% without epinephrine utilized for needle placement anesthetic Advised to contact for fevers/chills, erythema, induration, drainage, or persistent bleeding.   Pertinent History, Exam, Impression, and Recommendations:   Isaac Weber was seen today for impingement of left shoulder.  Impingement of left shoulder Assessment & Plan: Patient returns for follow-up to acute on chronic left shoulder pain in the setting of concomitant cervical spondylosis.  At last visit he was advised prednisone, activity restrictions, and follow-up.  He states that the prednisone completely resolved his pain while he was on the medication after a few days however the pain returned after completing the course.  As such, we discussed next steps.  Plan as follows: - Left shoulder subacromial corticosteroid injection -  Gradual return to normal activity - Contact us for any persistent symptoms - Follow-up as needed for this issue  Orders: -     [place] LIMITED JOINT SPACE STRUCTURES UP LEFT; Future -     [place] LIMITED JOINT SPACE STRUCTURES UP LEFT; Future -     Triamcinolone Acetonide     Orders & Medications Meds ordered this encounter  Medications   triamcinolone acetonide (KENALOG-40) injection 40 mg   Orders Placed This Encounter  Procedures   [place] LIMITED JOINT SPACE STRUCTURES UP LEFT   [place] LIMITED JOINT SPACE STRUCTURES UP LEFT     No follow-ups on file.     Jerrol Banana, MD, [facility]   Primary Care Sports Medicine Primary Care and Sports Medicine at [facility]

## 2022-11-30 NOTE — Assessment & Plan Note (Addendum)
Patient returns for follow-up to acute on chronic left shoulder pain in the setting of concomitant cervical spondylosis.  At last visit he was advised prednisone, activity restrictions, and follow-up.  He states that the prednisone completely resolved his pain while he was on the medication after a few days however the pain returned after completing the course.  As such, we discussed next steps.  Plan as follows: - Left shoulder subacromial corticosteroid injection - Gradual return to normal activity - Contact us for any persistent symptoms - Follow-up as needed for this issue

## 2022-11-30 NOTE — Addendum Note (Signed)
Addended by: Jerl Santos on: 11/30/2022 03:40 PM   Modules accepted: Orders

## 2022-12-01 NOTE — Telephone Encounter (Signed)
FYI  KP

## 2022-12-15 ENCOUNTER — Other Ambulatory Visit: Payer: Self-pay

## 2022-12-15 DIAGNOSIS — N401 Enlarged prostate with lower urinary tract symptoms: Secondary | ICD-10-CM

## 2022-12-15 DIAGNOSIS — E1169 Type 2 diabetes mellitus with other specified complication: Secondary | ICD-10-CM

## 2022-12-15 DIAGNOSIS — Z125 Encounter for screening for malignant neoplasm of prostate: Secondary | ICD-10-CM

## 2022-12-15 DIAGNOSIS — I10 Essential (primary) hypertension: Secondary | ICD-10-CM

## 2022-12-16 ENCOUNTER — Other Ambulatory Visit: Payer: PPO

## 2022-12-16 DIAGNOSIS — Z125 Encounter for screening for malignant neoplasm of prostate: Secondary | ICD-10-CM | POA: Diagnosis not present

## 2022-12-16 DIAGNOSIS — R351 Nocturia: Secondary | ICD-10-CM | POA: Diagnosis not present

## 2022-12-16 DIAGNOSIS — E1169 Type 2 diabetes mellitus with other specified complication: Secondary | ICD-10-CM | POA: Diagnosis not present

## 2022-12-16 DIAGNOSIS — E785 Hyperlipidemia, unspecified: Secondary | ICD-10-CM | POA: Diagnosis not present

## 2022-12-16 DIAGNOSIS — N401 Enlarged prostate with lower urinary tract symptoms: Secondary | ICD-10-CM | POA: Diagnosis not present

## 2022-12-16 DIAGNOSIS — I10 Essential (primary) hypertension: Secondary | ICD-10-CM | POA: Diagnosis not present

## 2022-12-16 LAB — CBC WITH DIFFERENTIAL/PLATELET
Lymphs Abs: 1632 cells/uL (ref 850–3900)
MPV: 9.6 fL (ref 7.5–12.5)
Monocytes Relative: 8.7 %

## 2022-12-17 LAB — COMPREHENSIVE METABOLIC PANEL
AG Ratio: 1.8 (calc) (ref 1.0–2.5)
ALT: 19 U/L (ref 9–46)
AST: 17 U/L (ref 10–35)
Albumin: 3.9 g/dL (ref 3.6–5.1)
Alkaline phosphatase (APISO): 65 U/L (ref 35–144)
BUN: 18 mg/dL (ref 7–25)
CO2: 28 mmol/L (ref 20–32)
Calcium: 9.2 mg/dL (ref 8.6–10.3)
Chloride: 104 mmol/L (ref 98–110)
Creat: 0.74 mg/dL (ref 0.70–1.28)
Globulin: 2.2 g/dL (calc) (ref 1.9–3.7)
Glucose, Bld: 137 mg/dL — ABNORMAL HIGH (ref 65–99)
Potassium: 4.6 mmol/L (ref 3.5–5.3)
Sodium: 137 mmol/L (ref 135–146)
Total Bilirubin: 0.8 mg/dL (ref 0.2–1.2)
Total Protein: 6.1 g/dL (ref 6.1–8.1)

## 2022-12-17 LAB — LIPID PANEL
Cholesterol: 126 mg/dL (ref <200)
HDL: 66 mg/dL (ref >=40)
LDL Cholesterol (Calc): 46 mg/dL (calc)
Non-HDL Cholesterol (Calc): 60 mg/dL (calc) (ref <130)
Total CHOL/HDL Ratio: 1.9 (calc) (ref <5.0)
Triglycerides: 55 mg/dL (ref <150)

## 2022-12-17 LAB — CBC WITH DIFFERENTIAL/PLATELET
Absolute Monocytes: 670 cells/uL (ref 200–950)
Basophils Absolute: 39 cells/uL (ref 0–200)
Basophils Relative: 0.5 %
Eosinophils Absolute: 323 cells/uL (ref 15–500)
Eosinophils Relative: 4.2 %
HCT: 38.8 % (ref 38.5–50.0)
Hemoglobin: 13.2 g/dL (ref 13.2–17.1)
MCH: 30.6 pg (ref 27.0–33.0)
MCHC: 34 g/dL (ref 32.0–36.0)
MCV: 89.8 fL (ref 80.0–100.0)
Neutro Abs: 5036 cells/uL (ref 1500–7800)
Neutrophils Relative %: 65.4 %
Platelets: 189 10*3/uL (ref 140–400)
RBC: 4.32 10*6/uL (ref 4.20–5.80)
RDW: 13.1 % (ref 11.0–15.0)
Total Lymphocyte: 21.2 %
WBC: 7.7 10*3/uL (ref 3.8–10.8)

## 2022-12-17 LAB — TSH: TSH: 1.58 mIU/L (ref 0.40–4.50)

## 2022-12-17 LAB — HEMOGLOBIN A1C
Hgb A1c MFr Bld: 7.5 % of total Hgb — ABNORMAL HIGH (ref <5.7)
Mean Plasma Glucose: 169 mg/dL
eAG (mmol/L): 9.3 mmol/L

## 2022-12-17 LAB — PSA: PSA: 3.14 ng/mL (ref <=4.00)

## 2022-12-23 ENCOUNTER — Ambulatory Visit (INDEPENDENT_AMBULATORY_CARE_PROVIDER_SITE_OTHER): Payer: PPO | Admitting: Family Medicine

## 2022-12-23 ENCOUNTER — Other Ambulatory Visit: Payer: Self-pay

## 2022-12-23 ENCOUNTER — Other Ambulatory Visit: Payer: Self-pay | Admitting: Family Medicine

## 2022-12-23 ENCOUNTER — Encounter: Payer: Self-pay | Admitting: Family Medicine

## 2022-12-23 VITALS — BP 100/52 | HR 79 | Ht 69.0 in | Wt 161.0 lb

## 2022-12-23 DIAGNOSIS — E1169 Type 2 diabetes mellitus with other specified complication: Secondary | ICD-10-CM

## 2022-12-23 DIAGNOSIS — Z0000 Encounter for general adult medical examination without abnormal findings: Secondary | ICD-10-CM

## 2022-12-23 DIAGNOSIS — R351 Nocturia: Secondary | ICD-10-CM | POA: Diagnosis not present

## 2022-12-23 DIAGNOSIS — E785 Hyperlipidemia, unspecified: Secondary | ICD-10-CM | POA: Diagnosis not present

## 2022-12-23 DIAGNOSIS — I10 Essential (primary) hypertension: Secondary | ICD-10-CM | POA: Diagnosis not present

## 2022-12-23 DIAGNOSIS — N401 Enlarged prostate with lower urinary tract symptoms: Secondary | ICD-10-CM

## 2022-12-23 MED ORDER — HYDROCHLOROTHIAZIDE 12.5 MG PO TABS
12.5000 mg | ORAL_TABLET | Freq: Every day | ORAL | 1 refills | Status: DC
Start: 2022-12-23 — End: 2023-07-04
  Filled 2022-12-23 – 2023-01-26 (×2): qty 90, 90d supply, fill #0
  Filled 2023-04-26: qty 90, 90d supply, fill #1

## 2022-12-23 MED ORDER — AMLODIPINE BESYLATE 5 MG PO TABS
5.0000 mg | ORAL_TABLET | Freq: Every day | ORAL | 1 refills | Status: DC
Start: 2022-12-23 — End: 2023-07-04
  Filled 2022-12-23 – 2023-01-26 (×2): qty 90, 90d supply, fill #0
  Filled 2023-04-26: qty 90, 90d supply, fill #1

## 2022-12-23 NOTE — Progress Notes (Signed)
Subjective:    Patient ID: Isaac Weber, male    DOB: August 23, 1945, 77 y.o.   MRN: [id number]  Isaac Weber is a 77 y.o. male presenting on 12/23/2022 for Diabetes   HPI   Updates  Swelling improved, he will reduce elevation of bed, he wears compression at times.  CHRONIC DM, Type 2 Hyperlipidemia ASCVD Risk LDL controlled. He remains on Rosuvastatin 5mg  daily for cardiovascular prevention. Previously A1c down to 6.3 Now elevated up to 7.5, likely attributed to cortisone injection and other factors Home CBG 140, 148, 155, 152, 157, high 234  Meds: Metformin XR 500mg  x2 = 1000mg  daily at bedtime, tolerating well OneTouch Ultra 2 test supplies  Reports good compliance. Tolerating well w/o side-effects Currently on ARB, Statin Followed by Dr Joseph Art [facility] [place] 09/2021 Denies hypoglycemia, polyuria, visual changes, numbness or tingling.   ASCVD Risk LDL controlled. He remains on Rosuvastatin 5mg  daily for cardiovascular prevention.   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 He gets BP checked every 8 weeks with donating blood. K has been stable on labs Current Meds - Losartan 50mg  and HCTZ 12.5mg  daily, Amlodipine 5mg  daily Reports good compliance, took meds today. Tolerating well, w/o complaints. Denies CP, dyspnea, HA, edema, dizziness / lightheadedness   History of Iron Deficiency Donates blood every 8 weeks. He takes iron supplement.   Osteoarthritis, bilateral knees, lumbar spine Chronic problem, multiple joints, episodic pain and flares He has has seen chiropractor as well. He said he tried Relief Factor, for 7 weeks limited effect. - Uses Voltaren topical PRN with good results - Not effective Tylenol Taking Motrin 200mg  x 4 = 800mg  PRN only on day of golf. Not daily or regularly He is able to play golf weekly  and is very active   PMH Bilateral Rotator Cuff Impingement / Shoulder Pain   Skin spot R side, has upcoming apt Dr Gwen Pounds in July   BPH LUTS s/p procedure, laser 2010 improvement - he has some BPH LUTS, some frequency, some urgency, nocturia 3 x nightly - taking Tamsulosin 0.4mg  daily, and may try 2 eventually   Prior history PSA with improvement from 4.06 down to 3.85, next yearly visit Dr Tomie China 12/2022, he may resolve the follow up and just check lab here  Latest update PSA down to 3.14, improved.      12/23/2022    9:25 AM 06/24/2022    9:05 AM 04/16/2022    8:59 AM  Depression screen PHQ 2/9  Decreased Interest 0 0 0  Down, Depressed, Hopeless 0 0 0  PHQ - 2 Score 0 0 0  Altered sleeping  0 0  Tired, decreased energy  0 0  Change in appetite  0 0  Feeling bad or failure about yourself   0 0  Trouble concentrating  0 0  Moving slowly or fidgety/restless  0 0  Suicidal thoughts  0 0  PHQ-9 Score  0 0  Difficult doing work/chores  Not difficult at all Not difficult at all    Social History   Tobacco Use   Smoking status: Former    Packs/day: 3.00    Years: 26.00    Additional pack years: 0.00    Total pack years: 78.00    Types: Cigarettes    Quit date: 09/17/1971    Years since quitting: 55.3  Passive exposure: Past   Smokeless tobacco: Never  Vaping Use   Vaping Use: Never used  Substance Use Topics   Alcohol use: Yes    Alcohol/week: 2.0 standard drinks of alcohol    Types: 2 Standard drinks or equivalent per week   Drug use: Never    Review of Systems Per HPI unless specifically indicated above     Objective:    BP (!) 100/52   Pulse 79   Ht 5\' 9"  (1.753 m)   Wt 161 lb (73 kg)   SpO2 100%   BMI 23.78 kg/m   Wt Readings from Last 3 Encounters:  12/23/22 161 lb (73 kg)  11/30/22 164 lb (74.4 kg)  10/28/22 169 lb (76.7 kg)    Physical Exam Vitals and nursing note reviewed.  Constitutional:      General: He is not in acute  distress.    Appearance: Normal appearance. He is well-developed. He is not diaphoretic.     Comments: Well-appearing, comfortable, cooperative  HENT:     Head: Normocephalic and atraumatic.  Eyes:     General:        Right eye: No discharge.        Left eye: No discharge.     Conjunctiva/sclera: Conjunctivae normal.  Cardiovascular:     Rate and Rhythm: Normal rate.  Pulmonary:     Effort: Pulmonary effort is normal.  Skin:    General: Skin is warm and dry.     Findings: No erythema or rash.  Neurological:     Mental Status: He is alert and oriented to person, place, and time.  Psychiatric:        Mood and Affect: Mood normal.        Behavior: Behavior normal.        Thought Content: Thought content normal.     Comments: Well groomed, good eye contact, normal speech and thoughts      Results for orders placed or performed in visit on 12/15/22  Comprehensive metabolic panel  Result Value Ref Range   Glucose, Bld 137 (H) 65 - 99 mg/dL   BUN 18 7 - 25 mg/dL   Creat 1.19 1.47 - 8.29 mg/dL   BUN/Creatinine Ratio SEE NOTE: 6 - 22 (calc)   Sodium 137 135 - 146 mmol/L   Potassium 4.6 3.5 - 5.3 mmol/L   Chloride 104 98 - 110 mmol/L   CO2 28 20 - 32 mmol/L   Calcium 9.2 8.6 - 10.3 mg/dL   Total Protein 6.1 6.1 - 8.1 g/dL   Albumin 3.9 3.6 - 5.1 g/dL   Globulin 2.2 1.9 - 3.7 g/dL (calc)   AG Ratio 1.8 1.0 - 2.5 (calc)   Total Bilirubin 0.8 0.2 - 1.2 mg/dL   Alkaline phosphatase (APISO) 65 35 - 144 U/L   AST 17 10 - 35 U/L   ALT 19 9 - 46 U/L  Lipid panel  Result Value Ref Range   Cholesterol 126 <200 mg/dL   HDL 66 > OR = 40 mg/dL   Triglycerides 55 <562 mg/dL   LDL Cholesterol (Calc) 46 mg/dL (calc)   Total CHOL/HDL Ratio 1.9 <5.0 (calc)   Non-HDL Cholesterol (Calc) 60 <130 mg/dL (calc)  CBC with Differential/Platelet  Result Value Ref Range   WBC 7.7 3.8 - 10.8 Thousand/uL   RBC 4.32 4.20 - 5.80 Million/uL   Hemoglobin 13.2 13.2 - 17.1 g/dL   HCT 86.5 78.4 - 69.6  %   MCV 89.8 80.0 -  100.0 fL   MCH 30.6 27.0 - 33.0 pg   MCHC 34.0 32.0 - 36.0 g/dL   RDW 16.1 09.6 - 04.5 %   Platelets 189 140 - 400 Thousand/uL   MPV 9.6 7.5 - 12.5 fL   Neutro Abs 5,036 1,500 - 7,800 cells/uL   Lymphs Abs 1,632 850 - 3,900 cells/uL   Absolute Monocytes 670 200 - 950 cells/uL   Eosinophils Absolute 323 15 - 500 cells/uL   Basophils Absolute 39 0 - 200 cells/uL   Neutrophils Relative % 65.4 %   Total Lymphocyte 21.2 %   Monocytes Relative 8.7 %   Eosinophils Relative 4.2 %   Basophils Relative 0.5 %  TSH  Result Value Ref Range   TSH 1.58 0.40 - 4.50 mIU/L  PSA  Result Value Ref Range   PSA 3.14 < OR = 4.00 ng/mL  Hemoglobin A1c  Result Value Ref Range   Hgb A1c MFr Bld 7.5 (H) <5.7 % of total Hgb   Mean Plasma Glucose 169 mg/dL   eAG (mmol/L) 9.3 mmol/L      Assessment & Plan:   Problem List Items Addressed This Visit     BPH associated with nocturia   Essential hypertension   Relevant Medications   amLODipine (NORVASC) 5 MG tablet   hydrochlorothiazide (HYDRODIURIL) 12.5 MG tablet   Hyperlipidemia associated with type 2 diabetes mellitus (HCC)   Relevant Medications   amLODipine (NORVASC) 5 MG tablet   hydrochlorothiazide (HYDRODIURIL) 12.5 MG tablet   Type 2 diabetes mellitus with other specified complication (HCC) - Primary    Keep up with your specialists.  Reviewed lab results  T2DM A1c 7.5 Within range goal < 8 Likely raised w/ cortisone injection among other factors No change to Metformin XR 500mg  x 2 = 1000mg , for now keep same dose. I am hopeful it will trend back to the stable range around 7 or under. Consider dose inc Metformin, defer newer medications today  BPH / PSA PSA 3.19, looked good, check with Dr Richardo Hanks and future plans.  HYPERTENSION Controlled  Refilled medications.  Meds ordered this encounter  Medications   amLODipine (NORVASC) 5 MG tablet    Sig: Take 1 tablet (5 mg total) by mouth daily.    Dispense:   90 tablet    Refill:  1   hydrochlorothiazide (HYDRODIURIL) 12.5 MG tablet    Sig: Take 1 tablet (12.5 mg total) by mouth daily.    Dispense:  90 tablet    Refill:  1      Follow up plan: Return in about 6 months (around 06/25/2023) for 6 month fasting lab only then 1 week later Annual Physical.  Future labs ordered for 06/27/23   Saralyn Pilar, DO [facility] 12/23/2022, 9:48 AM

## 2022-12-23 NOTE — Patient Instructions (Addendum)
Thank you for coming to the office today.  Keep up with your specialists.  No change to Metformin XR 500mg  x 2 = 1000mg , for now keep same dose. I am hopeful it will trend back to the stable range around 7 or under.  PSA 3.19, looked good, check with Dr Richardo Hanks and future plans.  Refilled medications.  DUE for FASTING BLOOD WORK (no food or drink after midnight before the lab appointment, only water or coffee without cream/sugar on the morning of)  SCHEDULE "Lab Only" visit in the morning at the clinic for lab draw in 6 MONTHS   - Make sure Lab Only appointment is at about 1 week before your next appointment, so that results will be available  For Lab Results, once available within 2-3 days of blood draw, you can can log in to MyChart online to view your results and a brief explanation. Also, we can discuss results at next follow-up visit.   Please schedule a Follow-up Appointment to: Return in about 6 months (around 06/25/2023) for 6 month fasting lab only then 1 week later Annual Physical.  If you have any other questions or concerns, please feel free to call the office or send a message through MyChart. You may also schedule an earlier appointment if necessary.  Additionally, you may be receiving a survey about your experience at our office within a few days to 1 week by e-mail or mail. We value your feedback.  Saralyn Pilar, DO [facility], [place]

## 2023-01-04 ENCOUNTER — Ambulatory Visit: Payer: PPO | Admitting: Urology

## 2023-01-05 ENCOUNTER — Ambulatory Visit: Payer: PPO | Admitting: Urology

## 2023-01-12 ENCOUNTER — Other Ambulatory Visit (HOSPITAL_COMMUNITY): Payer: Self-pay

## 2023-01-12 ENCOUNTER — Other Ambulatory Visit: Payer: Self-pay

## 2023-01-12 ENCOUNTER — Ambulatory Visit: Payer: PPO | Admitting: Urology

## 2023-01-12 ENCOUNTER — Encounter: Payer: Self-pay | Admitting: Urology

## 2023-01-12 VITALS — BP 136/77 | HR 92 | Ht 69.0 in | Wt 156.0 lb

## 2023-01-12 DIAGNOSIS — Z125 Encounter for screening for malignant neoplasm of prostate: Secondary | ICD-10-CM

## 2023-01-12 DIAGNOSIS — R351 Nocturia: Secondary | ICD-10-CM | POA: Diagnosis not present

## 2023-01-12 DIAGNOSIS — N401 Enlarged prostate with lower urinary tract symptoms: Secondary | ICD-10-CM | POA: Diagnosis not present

## 2023-01-12 LAB — BLADDER SCAN AMB NON-IMAGING

## 2023-01-12 MED ORDER — TAMSULOSIN HCL 0.4 MG PO CAPS
0.4000 mg | ORAL_CAPSULE | Freq: Every day | ORAL | 3 refills | Status: DC
  Filled 2023-01-12 – 2023-02-17 (×2): qty 90, 90d supply, fill #0
  Filled 2023-05-21: qty 90, 90d supply, fill #1
  Filled 2023-08-17: qty 90, 90d supply, fill #2
  Filled 2023-11-15: qty 90, 90d supply, fill #3

## 2023-01-12 NOTE — Progress Notes (Signed)
   01/12/2023 12:27 PM   RIAN WASSIL 11-Apr-1946 [id number]  Reason for visit: Follow up BPH, incomplete bladder emptying, PSA screening  HPI: Healthy 77 year old male who I originally saw in March 2022.  He has a history of BPH and previously underwent a TUNA procedure as well as a greenlight laser PVP with an outside urologist.  These temporarily improved his urinary symptoms.  He has been on Flomax at least a few years.  PVRs have been mildly elevated up to in the past, PVR today normal at .  We previously has discussed options like cystoscopy or TRUS to evaluate for outlet procedures, but he deferred.  Really denies any significant bothersome urinary symptoms aside from some chronic nocturia.  Recent PSA was normal at 3.14 which is actually decreased over the last few years.  Reassurance provided regarding normal PSA, would recommend discontinuing screening per the AUA guideline recommendations.  -Return precautions were discussed including worsening urinary symptoms, UTIs, or urinary retention which would prompt cystoscopy/TRUS for consideration of an outlet procedure.   -Flomax refilled -RTC 1 year PVR  Sondra Come, MD  [facility] [address], [address] [place], [place] [address] [phone]

## 2023-01-12 NOTE — Patient Instructions (Signed)

## 2023-01-17 ENCOUNTER — Other Ambulatory Visit: Payer: Self-pay

## 2023-01-17 ENCOUNTER — Other Ambulatory Visit (HOSPITAL_COMMUNITY): Payer: Self-pay

## 2023-01-17 ENCOUNTER — Other Ambulatory Visit: Payer: Self-pay | Admitting: Dermatology

## 2023-01-17 MED ORDER — KETOCONAZOLE 2 % EX SHAM
MEDICATED_SHAMPOO | CUTANEOUS | 10 refills | Status: AC
  Filled 2023-01-17: qty 120, 30d supply, fill #0
  Filled 2023-02-25: qty 120, 30d supply, fill #1

## 2023-01-19 ENCOUNTER — Encounter: Payer: Self-pay | Admitting: Family Medicine

## 2023-01-19 ENCOUNTER — Other Ambulatory Visit (HOSPITAL_COMMUNITY): Payer: Self-pay

## 2023-01-19 ENCOUNTER — Ambulatory Visit: Payer: PPO | Admitting: Dermatology

## 2023-01-19 VITALS — BP 135/79

## 2023-01-19 DIAGNOSIS — Z85828 Personal history of other malignant neoplasm of skin: Secondary | ICD-10-CM

## 2023-01-19 DIAGNOSIS — D692 Other nonthrombocytopenic purpura: Secondary | ICD-10-CM

## 2023-01-19 DIAGNOSIS — Z79899 Other long term (current) drug therapy: Secondary | ICD-10-CM

## 2023-01-19 DIAGNOSIS — L821 Other seborrheic keratosis: Secondary | ICD-10-CM

## 2023-01-19 DIAGNOSIS — L814 Other melanin hyperpigmentation: Secondary | ICD-10-CM | POA: Diagnosis not present

## 2023-01-19 DIAGNOSIS — L578 Other skin changes due to chronic exposure to nonionizing radiation: Secondary | ICD-10-CM

## 2023-01-19 DIAGNOSIS — Z1283 Encounter for screening for malignant neoplasm of skin: Secondary | ICD-10-CM

## 2023-01-19 DIAGNOSIS — L820 Inflamed seborrheic keratosis: Secondary | ICD-10-CM | POA: Diagnosis not present

## 2023-01-19 DIAGNOSIS — Z872 Personal history of diseases of the skin and subcutaneous tissue: Secondary | ICD-10-CM | POA: Diagnosis not present

## 2023-01-19 DIAGNOSIS — D1801 Hemangioma of skin and subcutaneous tissue: Secondary | ICD-10-CM

## 2023-01-19 DIAGNOSIS — Z8589 Personal history of malignant neoplasm of other organs and systems: Secondary | ICD-10-CM

## 2023-01-19 DIAGNOSIS — L570 Actinic keratosis: Secondary | ICD-10-CM | POA: Diagnosis not present

## 2023-01-19 DIAGNOSIS — W908XXA Exposure to other nonionizing radiation, initial encounter: Secondary | ICD-10-CM | POA: Diagnosis not present

## 2023-01-19 DIAGNOSIS — L219 Seborrheic dermatitis, unspecified: Secondary | ICD-10-CM

## 2023-01-19 DIAGNOSIS — D229 Melanocytic nevi, unspecified: Secondary | ICD-10-CM

## 2023-01-19 NOTE — Progress Notes (Signed)
Follow-Up Visit   Subjective  Isaac Weber is a 77 y.o. male who presents for the following: Skin Cancer Screening and Full Body Skin Exam, hx of SCCs, Aks, check spot R side ~40m, seb derm scalp, Ketoconazole 2% shampoo qd  The patient presents for Total-Body Skin Exam (TBSE) for skin cancer screening and mole check. The patient has spots, moles and lesions to be evaluated, some may be new or changing and the patient has concerns that these could be cancer.    The following portions of the chart were reviewed this encounter and updated as appropriate: medications, allergies, medical history  Review of Systems:  No other skin or systemic complaints except as noted in HPI or Assessment and Plan.  Objective  Well appearing patient in no apparent distress; mood and affect are within normal limits.  A full examination was performed including scalp, head, eyes, ears, nose, lips, neck, chest, axillae, abdomen, back, buttocks, bilateral upper extremities, bilateral lower extremities, hands, feet, fingers, toes, fingernails, and toenails. All findings within normal limits unless otherwise noted below.   Relevant physical exam findings are noted in the Assessment and Plan.  R ear x 1 Pink scaly macules  Right Flank x 1, R pretibial x 1 (2) Stuck on waxy paps with erythema    Assessment & Plan   LENTIGINES, SEBORRHEIC KERATOSES, HEMANGIOMAS - Benign normal skin lesions - Benign-appearing - Call for any changes  MELANOCYTIC NEVI - Tan-brown and/or pink-flesh-colored symmetric macules and papules - Benign appearing on exam today - Observation - Call clinic for new or changing moles - Recommend daily use of broad spectrum spf 30+ sunscreen to sun-exposed areas.   ACTINIC DAMAGE - Chronic condition, secondary to cumulative UV/sun exposure - diffuse scaly erythematous macules with underlying dyspigmentation - Recommend daily broad spectrum sunscreen SPF 30+ to sun-exposed areas,  reapply every 2 hours as needed.  - Staying in the shade or wearing long sleeves, sun glasses (UVA+UVB protection) and wide brim hats (4-inch brim around the entire circumference of the hat) are also recommended for sun protection.  - Call for new or changing lesions.  SKIN CANCER SCREENING PERFORMED TODAY.  HISTORY OF SQUAMOUS CELL CARCINOMA OF THE SKIN - No evidence of recurrence today - No lymphadenopathy - Recommend regular full body skin exams - Recommend daily broad spectrum sunscreen SPF 30+ to sun-exposed areas, reapply every 2 hours as needed.  - Call if any new or changing lesions are noted between office visits - Multiple  SEBORRHEIC DERMATITIS scalp Exam: scalp clear today  Chronic condition with duration or expected duration over one year. Currently well-controlled.   Seborrheic Dermatitis is a chronic persistent rash characterized by pinkness and scaling most commonly of the mid face but also can occur on the scalp (dandruff), ears; mid chest, mid back and groin.  It tends to be exacerbated by stress and cooler weather.  People who have neurologic disease may experience new onset or exacerbation of existing seborrheic dermatitis.  The condition is not curable but treatable and can be controlled.  Treatment Plan: Cont Ketoconazole 2% shampoo q shampoo, let sit 5 minutes and rinse out  AK (actinic keratosis) R ear x 1  Destruction of lesion - R ear x 1 Complexity: simple   Destruction method: cryotherapy   Informed consent: discussed and consent obtained   Timeout:  patient name, date of birth, surgical site, and procedure verified Lesion destroyed using liquid nitrogen: Yes   Region frozen until ice ball extended  beyond lesion: Yes   Outcome: patient tolerated procedure well with no complications   Post-procedure details: wound care instructions given    Inflamed seborrheic keratosis (2) Right Flank x 1, R pretibial x 1  Symptomatic, irritating, patient would like  treated.   Destruction of lesion - Right Flank x 1, R pretibial x 1 Complexity: simple   Destruction method: cryotherapy   Informed consent: discussed and consent obtained   Timeout:  patient name, date of birth, surgical site, and procedure verified Lesion destroyed using liquid nitrogen: Yes   Region frozen until ice ball extended beyond lesion: Yes   Outcome: patient tolerated procedure well with no complications   Post-procedure details: wound care instructions given     Purpura - Chronic; persistent and recurrent.  Treatable, but not curable. - Violaceous macules and patches - Benign - Related to trauma, age, sun damage and/or use of blood thinners, chronic use of topical and/or oral steroids - Observe - Can use OTC arnica containing moisturizer such as Dermend Bruise Formula if desired - Call for worsening or other concerns   Return in about 1 year (around 01/19/2024) for TBSE, Hx of SCC, Hx of AKs.  I, [name], RMA, am acting as scribe for Armida Sans, MD .   Documentation: I have reviewed the above documentation for accuracy and completeness, and I agree with the above.  Armida Sans, MD

## 2023-01-19 NOTE — Telephone Encounter (Signed)
Please review.  KP

## 2023-01-19 NOTE — Patient Instructions (Addendum)
Cryotherapy Aftercare  Wash gently with soap and water everyday.   Apply Vaseline and Band-Aid daily until healed.     Due to recent changes in healthcare laws, you may see results of your pathology and/or laboratory studies on MyChart before the doctors have had a chance to review them. We understand that in some cases there may be results that are confusing or concerning to you. Please understand that not all results are received at the same time and often the doctors may need to interpret multiple results in order to provide you with the best plan of care or course of treatment. Therefore, we ask that you please give us 2 business days to thoroughly review all your results before contacting the office for clarification. Should we see a critical lab result, you will be contacted sooner.   If You Need Anything After Your Visit  If you have any questions or concerns for your doctor, please call our main line at [phone] and press option 4 to reach your doctor's medical assistant. If no one answers, please leave a voicemail as directed and we will return your call as soon as possible. Messages left after 4 pm will be answered the following business day.   You may also send us a message via MyChart. We typically respond to MyChart messages within 1-2 business days.  For prescription refills, please ask your pharmacy to contact our office. Our fax number is [phone].  If you have an urgent issue when the clinic is closed that cannot wait until the next business day, you can page your doctor at the number below.    Please note that while we do our best to be available for urgent issues outside of office hours, we are not available 24/7.   If you have an urgent issue and are unable to reach us, you may choose to seek medical care at your doctor's office, retail clinic, urgent care center, or emergency room.  If you have a medical emergency, please immediately call 911 or go to the  emergency department.  Pager Numbers  - Dr. Kowalski: [phone]  - Dr. Moye: [phone]  - Dr. Stewart: [phone]  In the event of inclement weather, please call our main line at [phone] for an update on the status of any delays or closures.  Dermatology Medication Tips: Please keep the boxes that topical medications come in in order to help keep track of the instructions about where and how to use these. Pharmacies typically print the medication instructions only on the boxes and not directly on the medication tubes.   If your medication is too expensive, please contact our office at [phone] option 4 or send us a message through MyChart.   We are unable to tell what your co-pay for medications will be in advance as this is different depending on your insurance coverage. However, we may be able to find a substitute medication at lower cost or fill out paperwork to get insurance to cover a needed medication.   If a prior authorization is required to get your medication covered by your insurance company, please allow us 1-2 business days to complete this process.  Drug prices often vary depending on where the prescription is filled and some pharmacies may offer cheaper prices.  The website www.goodrx.com contains coupons for medications through different pharmacies. The prices here do not account for what the cost may be with help from insurance (it may be cheaper with your insurance), but the website can   give you the price if you did not use any insurance.  - You can print the associated coupon and take it with your prescription to the pharmacy.  - You may also stop by our office during regular business hours and pick up a GoodRx coupon card.  - If you need your prescription sent electronically to a different pharmacy, notify our office through [place] MyChart or by phone at [phone] option 4.     Si Usted Necesita Algo Despus de Su Visita  Tambin puede  enviarnos un mensaje a travs de MyChart. Por lo general respondemos a los mensajes de MyChart en el transcurso de 1 a 2 das hbiles.  Para renovar recetas, por favor pida a su farmacia que se ponga en contacto con nuestra oficina. Nuestro nmero de fax es el [phone].  Si tiene un asunto urgente cuando la clnica est cerrada y que no puede esperar hasta el siguiente da hbil, puede llamar/localizar a su doctor(a) al nmero que aparece a continuacin.   Por favor, tenga en cuenta que aunque hacemos todo lo posible para estar disponibles para asuntos urgentes fuera del horario de oficina, no estamos disponibles las 24 horas del da, los 7 das de la semana.   Si tiene un problema urgente y no puede comunicarse con nosotros, puede optar por buscar atencin mdica  en el consultorio de su doctor(a), en una clnica privada, en un centro de atencin urgente o en una sala de emergencias.  Si tiene una emergencia mdica, por favor llame inmediatamente al 911 o vaya a la sala de emergencias.  Nmeros de bper  - Dr. Kowalski: [phone]  - Dra. Moye: [phone]  - Dra. Stewart: [phone]  En caso de inclemencias del tiempo, por favor llame a nuestra lnea principal al [phone] para una actualizacin sobre el estado de cualquier retraso o cierre.  Consejos para la medicacin en dermatologa: Por favor, guarde las cajas en las que vienen los medicamentos de uso tpico para ayudarle a seguir las instrucciones sobre dnde y cmo usarlos. Las farmacias generalmente imprimen las instrucciones del medicamento slo en las cajas y no directamente en los tubos del medicamento.   Si su medicamento es muy caro, por favor, pngase en contacto con nuestra oficina llamando al [phone] y presione la opcin 4 o envenos un mensaje a travs de MyChart.   No podemos decirle cul ser su copago por los medicamentos por adelantado ya que esto es diferente dependiendo de la cobertura de su seguro.  Sin embargo, es posible que podamos encontrar un medicamento sustituto a menor costo o llenar un formulario para que el seguro cubra el medicamento que se considera necesario.   Si se requiere una autorizacin previa para que su compaa de seguros cubra su medicamento, por favor permtanos de 1 a 2 das hbiles para completar este proceso.  Los precios de los medicamentos varan con frecuencia dependiendo del lugar de dnde se surte la receta y alguna farmacias pueden ofrecer precios ms baratos.  El sitio web www.goodrx.com tiene cupones para medicamentos de diferentes farmacias. Los precios aqu no tienen en cuenta lo que podra costar con la ayuda del seguro (puede ser ms barato con su seguro), pero el sitio web puede darle el precio si no utiliz ningn seguro.  - Puede imprimir el cupn correspondiente y llevarlo con su receta a la farmacia.  - Tambin puede pasar por nuestra oficina durante el horario de atencin regular y recoger una tarjeta de cupones de GoodRx.  -   Si necesita que su receta se enve electrnicamente a una farmacia diferente, informe a nuestra oficina a travs de MyChart de  o por telfono llamando al [phone] y presione la opcin 4.  

## 2023-01-20 NOTE — Telephone Encounter (Signed)
Please advise on an orthopedic

## 2023-01-21 ENCOUNTER — Encounter: Payer: Self-pay | Admitting: Dermatology

## 2023-01-24 ENCOUNTER — Telehealth: Payer: Self-pay | Admitting: Family Medicine

## 2023-01-24 NOTE — Telephone Encounter (Signed)
Please review.  KP

## 2023-01-24 NOTE — Telephone Encounter (Signed)
Copied from CRM [id number]. Topic: Referral - Request for Referral >> Jan 24, 2023 11:51 AM Patsy Lager T wrote: Has patient seen PCP for this complaint? Yes.   *If NO, is insurance requiring patient see PCP for this issue before PCP can refer them? Referral for which specialty: Orthopedic Preferred provider/office: [facility], Dr Joice Lofts or Dr Rosita Kea Reason for referral: left shoulder pain

## 2023-01-26 ENCOUNTER — Other Ambulatory Visit: Payer: Self-pay

## 2023-01-28 NOTE — Telephone Encounter (Signed)
Spoke to pt he has an appointment with Dr. Rosita Kea. Next week.  KP

## 2023-01-31 DIAGNOSIS — M75102 Unspecified rotator cuff tear or rupture of left shoulder, not specified as traumatic: Secondary | ICD-10-CM | POA: Diagnosis not present

## 2023-01-31 DIAGNOSIS — M12812 Other specific arthropathies, not elsewhere classified, left shoulder: Secondary | ICD-10-CM | POA: Diagnosis not present

## 2023-02-02 ENCOUNTER — Other Ambulatory Visit: Payer: Self-pay

## 2023-02-02 DIAGNOSIS — M47817 Spondylosis without myelopathy or radiculopathy, lumbosacral region: Secondary | ICD-10-CM

## 2023-02-03 ENCOUNTER — Ambulatory Visit: Payer: PPO | Admitting: Neurosurgery

## 2023-02-03 ENCOUNTER — Ambulatory Visit
Admission: RE | Admit: 2023-02-03 | Discharge: 2023-02-03 | Disposition: A | Discharge status: Home / Self Care | Payer: PPO | Source: Ambulatory Visit | Attending: Neurosurgery | Admitting: Neurosurgery

## 2023-02-03 ENCOUNTER — Encounter: Payer: Self-pay | Admitting: Neurosurgery

## 2023-02-03 ENCOUNTER — Ambulatory Visit
Admission: RE | Admit: 2023-02-03 | Discharge: 2023-02-03 | Disposition: A | Discharge status: Home / Self Care | Payer: PPO | Attending: Neurosurgery | Admitting: Neurosurgery

## 2023-02-03 VITALS — BP 132/68 | Ht 69.0 in | Wt 159.8 lb

## 2023-02-03 DIAGNOSIS — Z09 Encounter for follow-up examination after completed treatment for conditions other than malignant neoplasm: Secondary | ICD-10-CM | POA: Diagnosis not present

## 2023-02-03 DIAGNOSIS — M47817 Spondylosis without myelopathy or radiculopathy, lumbosacral region: Secondary | ICD-10-CM

## 2023-02-03 DIAGNOSIS — Z981 Arthrodesis status: Secondary | ICD-10-CM | POA: Diagnosis not present

## 2023-02-03 DIAGNOSIS — M47816 Spondylosis without myelopathy or radiculopathy, lumbar region: Secondary | ICD-10-CM | POA: Diagnosis not present

## 2023-02-03 DIAGNOSIS — M5126 Other intervertebral disc displacement, lumbar region: Secondary | ICD-10-CM | POA: Diagnosis not present

## 2023-02-03 NOTE — Progress Notes (Signed)
   REFERRING PHYSICIAN:  Kasandra Knudsen [address] [place],  [place] [address]  DOS: 05/14/22  L4-5 TLIF, R L5/S1 decompression   HISTORY OF PRESENT ILLNESS: Isaac Weber is status post L4-5 TLIF, R L5/S1 decompression.  He is doing extremely well.  He has no pain.     PHYSICAL EXAMINATION:  Vitals:   02/03/23 0922  BP: 132/68    5 out of 5 strength throughout his bilateral lower extremities. SILT.   ROS (Neurologic):  Negative except as noted above  IMAGING: No complications noted  ASSESSMENT/PLAN:  Isaac Weber is doing very well s/p above surgery.  I am very pleased with his recovery from low back surgery.   I will see him back as needed.  I have released him to full activities as tolerated.  Venetia Night MD Department of neurosurgery

## 2023-02-17 ENCOUNTER — Other Ambulatory Visit (HOSPITAL_COMMUNITY): Payer: Self-pay

## 2023-02-25 ENCOUNTER — Other Ambulatory Visit (HOSPITAL_COMMUNITY): Payer: Self-pay

## 2023-02-25 ENCOUNTER — Ambulatory Visit
Admission: RE | Admit: 2023-02-25 | Discharge: 2023-02-25 | Disposition: A | Discharge status: Home / Self Care | Payer: PPO | Source: Ambulatory Visit | Attending: Physician Assistant | Admitting: Physician Assistant

## 2023-02-25 ENCOUNTER — Encounter: Payer: Self-pay | Admitting: Physician Assistant

## 2023-02-25 ENCOUNTER — Other Ambulatory Visit: Payer: Self-pay

## 2023-02-25 ENCOUNTER — Ambulatory Visit
Admission: RE | Admit: 2023-02-25 | Discharge: 2023-02-25 | Disposition: A | Discharge status: Home / Self Care | Payer: PPO | Attending: Physician Assistant | Admitting: Physician Assistant

## 2023-02-25 ENCOUNTER — Ambulatory Visit (INDEPENDENT_AMBULATORY_CARE_PROVIDER_SITE_OTHER): Payer: PPO | Admitting: Physician Assistant

## 2023-02-25 VITALS — BP 128/60 | HR 84 | Temp 96.8°F | Wt 164.0 lb

## 2023-02-25 DIAGNOSIS — R062 Wheezing: Secondary | ICD-10-CM

## 2023-02-25 DIAGNOSIS — R058 Other specified cough: Secondary | ICD-10-CM | POA: Diagnosis not present

## 2023-02-25 DIAGNOSIS — R059 Cough, unspecified: Secondary | ICD-10-CM | POA: Diagnosis not present

## 2023-02-25 MED ORDER — ALBUTEROL SULFATE HFA 108 (90 BASE) MCG/ACT IN AERS
2.0000 | INHALATION_SPRAY | Freq: Four times a day (QID) | RESPIRATORY_TRACT | 0 refills | Status: AC | PRN
Start: 2023-02-25 — End: ?
  Filled 2023-02-25: qty 6.7, 25d supply, fill #0

## 2023-02-25 NOTE — Patient Instructions (Signed)
I have sent in a script for a rescue inhaler to help with your breathing and coughing. You can use up to every 6 hours as needed    I also recommend adding an antihistamine to your daily regimen This includes medications like Claritin, Allegra, Zyrtec- the generics of these work very well and are usually less expensive I recommend using Flonase nasal spray - 2 puffs twice per day to help with your nasal congestion The antihistamines and Flonase can take a few weeks to provide significant relief from allergy symptoms but should start to provide some benefit soon.

## 2023-02-25 NOTE — Progress Notes (Signed)
Your chest xray did not show signs of active pneumonia at this time. Please proceed with the recommendations discussed at your apt. And start the antihistamine and Flonase

## 2023-02-25 NOTE — Assessment & Plan Note (Signed)
Acute, new concern Patient reports ongoing productive coughing for the last month  He has a previous hx of emphysema that does not appear to be currently managed with inhaler regimen due to mild severity Wheezing heard on auscultation today  Will send for CXR and provide albuterol rescue inhaler to assist with wheezing pending CXR results Recommend starting antihistamine and flonase for now to assist with potential rhinitis -related etiology Results of CXR to dictate further management- may need to increase to daily inhaler and abx  Reviewed ED and return precautions.  Follow up as needed for persistent or progressing symptoms

## 2023-02-25 NOTE — Progress Notes (Signed)
Acute Office Visit   Patient: Isaac Weber   DOB: 12-02-1945   77 y.o. Male  MRN: [id number] Visit Date: 02/25/2023  Today's healthcare provider: Oswaldo Conroy , PA-C  Introduced myself to the patient as a Secondary school teacher and provided education on APPs in clinical practice.    Chief Complaint  Patient presents with   Cough    Cough started about a month ago.  Pt is concern for infection.     Subjective    HPI HPI     Cough    Additional comments: Cough started about a month ago.  Pt is concern for infection.        Last edited by Paschal Dopp, CMA on 02/25/2023  9:39 AM.       Concern for Cough  Onset: gradual and ongoing  Duration: ongoing for about a month Associated symptoms: dry cough with some phlegm  He does not use inhalers at this time but thinks he has a bit left on an old one he can use for now Interventions: nothing so far   Went to the beach and did not take his device to the beach  Previous related hx: reports previous hx of lung infection      Medications: Outpatient Medications Prior to Visit  Medication Sig   amLODipine (NORVASC) 5 MG tablet Take 1 tablet (5 mg total) by mouth daily.   Blood Glucose Monitoring Suppl (ONE TOUCH ULTRA 2) w/Device KIT Use to check blood sugar up to 2 times daily   ferrous sulfate 325 (65 FE) MG tablet Take 325 mg by mouth daily with breakfast.   Glucosamine-Chondroitin (GLUCOSAMINE CHONDR COMPLEX PO) Take 1 capsule by mouth 2 (two) times daily.   hydrochlorothiazide (HYDRODIURIL) 12.5 MG tablet Take 1 tablet (12.5 mg total) by mouth daily.   ketoconazole (NIZORAL) 2 % shampoo Apply to scalp and let sit for 10 minutes then wash off, use 3 times a week.   losartan (COZAAR) 50 MG tablet Take 1 tablet (50 mg total) by mouth daily.   metFORMIN (GLUCOPHAGE-XR) 500 MG 24 hr tablet Take 2 tablets (1,000 mg total) by mouth daily with breakfast.   Multiple Minerals-Vitamins (CAL MAG ZINC +D3 PO) Take 1 tablet by mouth every  evening.   Multiple Vitamin (MULTIVITAMIN WITH MINERALS) TABS tablet Take 1 tablet by mouth daily.   ONETOUCH ULTRA test strip Use to check blood sugar up to twice per day   rosuvastatin (CRESTOR) 5 MG tablet Take 1 tablet (5 mg total) by mouth daily.   tamsulosin (FLOMAX) 0.4 MG CAPS capsule Take 1 capsule (0.4 mg total) by mouth daily.   No facility-administered medications prior to visit.    Review of Systems  Constitutional:  Negative for chills, fatigue and fever.  HENT:  Positive for congestion. Negative for postnasal drip, rhinorrhea and sore throat.   Respiratory:  Positive for cough. Negative for chest tightness, shortness of breath and wheezing.           Objective    BP 128/60 (BP Location: Right Arm, Patient Position: Sitting, Cuff Size: Normal)   Pulse 84   Temp (!) 96.8 F (36 C) (Temporal)   Wt 164 lb (74.4 kg)   SpO2 99%   BMI 24.22 kg/m       Physical Exam Vitals reviewed.  Constitutional:      General: He is awake.     Appearance: Normal appearance. He is well-developed and well-groomed.  HENT:     Head: Normocephalic and atraumatic.  Cardiovascular:     Rate and Rhythm: Normal rate and regular rhythm.     Pulses: Normal pulses.          Radial pulses are 2+ on the right side and 2+ on the left side.     Heart sounds: Normal heart sounds. No murmur heard.    No friction rub. No gallop.  Pulmonary:     Effort: Pulmonary effort is normal.     Breath sounds: Decreased air movement present. Examination of the right-middle field reveals wheezing. Examination of the left-middle field reveals wheezing. Examination of the right-lower field reveals decreased breath sounds and wheezing. Examination of the left-lower field reveals decreased breath sounds and wheezing. Decreased breath sounds and wheezing present. No rhonchi or rales.  Neurological:     Mental Status: He is alert.  Psychiatric:        Behavior: Behavior is cooperative.       No results  found for any visits on 02/25/23.  Assessment & Plan      No follow-ups on file.      Problem List Items Addressed This Visit       Other   Wheezing - Primary    Acute, new concern Patient reports ongoing productive coughing for the last month  He has a previous hx of emphysema that does not appear to be currently managed with inhaler regimen due to mild severity Wheezing heard on auscultation today  Will send for CXR and provide albuterol rescue inhaler to assist with wheezing pending CXR results Recommend starting antihistamine and flonase for now to assist with potential rhinitis -related etiology Results of CXR to dictate further management- may need to increase to daily inhaler and abx  Reviewed ED and return precautions.  Follow up as needed for persistent or progressing symptoms        Relevant Medications   albuterol (VENTOLIN HFA) 108 (90 Base) MCG/ACT inhaler   Other Relevant Orders   DG Chest 2 View   Other Visit Diagnoses     Productive cough            No follow-ups on file.   I,  E , PA-C, have reviewed all documentation for this visit. The documentation on 02/25/23 for the exam, diagnosis, procedures, and orders are all accurate and complete.   Jacquelin Hawking, MHS, PA-C [facility]

## 2023-03-14 ENCOUNTER — Other Ambulatory Visit (HOSPITAL_COMMUNITY): Payer: Self-pay

## 2023-03-14 ENCOUNTER — Other Ambulatory Visit: Payer: Self-pay

## 2023-03-26 ENCOUNTER — Encounter: Payer: Self-pay | Admitting: Physical Therapy

## 2023-03-29 ENCOUNTER — Encounter: Payer: Self-pay | Admitting: Internal Medicine

## 2023-03-29 ENCOUNTER — Ambulatory Visit: Payer: PPO | Admitting: Internal Medicine

## 2023-03-29 VITALS — BP 130/70 | HR 106 | Ht 69.0 in | Wt 166.2 lb

## 2023-03-29 DIAGNOSIS — R053 Chronic cough: Secondary | ICD-10-CM

## 2023-03-29 DIAGNOSIS — J479 Bronchiectasis, uncomplicated: Secondary | ICD-10-CM

## 2023-03-29 MED ORDER — CIPROFLOXACIN HCL 750 MG PO TABS
750.0000 mg | ORAL_TABLET | Freq: Two times a day (BID) | ORAL | 0 refills | Status: DC
  Filled 2023-03-29: qty 24, 12d supply, fill #0

## 2023-03-29 NOTE — Patient Instructions (Addendum)
ICD-10-CM   1. Chronic cough  R05.3     2. 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 Marchelle Gearing or nurse practitioner in 4 weeks to discuss test results  -Hopefully can avoid bronchoscopy

## 2023-03-29 NOTE — Progress Notes (Signed)
PCP Juluis Rainier, MD  HPI    IOV  07/08/2017  Chief Complaint  Patient presents with   Advice Only    Referred by Dr. Zachery Dauer for cough x57months.  Pt states that his cough is  a dry cough. Pt was put on nexium x2 weeks for acid reflux which he states has loosened the mucus some.      77 year old male originally from [place] and a former Arts development officer.  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  He tells me that mid September 2018 he went to the beach.  Upon return from the beach his granddaughter was sick with a cough and subsequently on April 16, 2017 he abruptly developed a cough.  Since then the cough has persisted.  The only thing that has improved and his cough is that he is no longer having significant night cough although he still does have some  amount of night cough.  This night cough resolved after opioid cough syrup.  Cough is made worse by talking, laughing and also randomly.  It is associated with clearing of the throat.  He feels a tightness in the upper chest.  There is no associated wheezing but there is still some residual nocturnal symptoms.  There is no associated acid reflux.  He has been on Nexium for a week or 2 without much relief.  He has not tried anything for nasal but he denies any nasal discharge.  He is not on any ACE inhibitors.  He does not have a previous history of asthma but his exam nitric oxide today is elevated borderline at 44 ppb.  He does clear the throat.  The cough is annoying.  He tells me that he did have a chest x-ray with his primary care physician that was clear.  The history is obtained from him, talking to his wife and review of the primary care physician referral notes.  Lab review she had a hemoglobin 11.6 g% in February 2016 and a eosinophils f 300 cells.  He is a former smoker  He does not have any shortness of breath.  He walks over a mile.  Sometimes he will cough and a pro-air can help him.  He does find the [employer] helps him.  At this  point in time he prefers for conservative simplistic line of treatment   OV 08/18/2017  Chief Complaint  Patient presents with   Follow-up    Pt states he has been doing good since last visit. Cough is better.   Follow-up chronic cough  After last visit he decided to take Asmanex.  He also followed a diet and took Prilosec.  With this the cough is more than 60% better as documented in the RSI cough score.  He does not want to take any more new medications.  He feels prednisone burst helped him a lot in the initial few days.  He is wondering which of these measures have helped him the most.  There are no new issues.  He wants to liberalize his diet which he says is actually helping him with the sugars and with the cough but he would like to be less disciplined about food.  He is willing to continue with Asmanex   feno 43 ppb  OV 02/28/2018  Chief Complaint  Patient presents with   Follow-up    Pt states things had been doing good for him and did go off of the inhaler but stated after he had been off  of the inhaler for about a week, the cough came back in the mornings and now he states he is coughing all the time. He states he is still not taking the medication and he states he is still coughing.    Follow-up chronic cough  He is here with his wife. He tells me that approximately one month ago because he was feeling well without any cough he stopped his Asmanex and then 2 weeks ago his cough returned. RSI cough score is 17. When he lies downhe has a cough. But he does not wake up in the middle of the night because of cough. There is no associated wheezing or shortness of breath or chest tightness or any change in his health status. Social history: His wife is new diagnosis of liver cirrhosis. She is going MRI today. Exam nitric oxide is in the indeterminate range today for him. Review of the chart indicates last chest x-ray was in 2010.    OV 05/31/2018  Subjective:  Patient ID: RYSON BLACKHAM, male , DOB: [birth year] , age 64 y.o. , MRN: [id number] , ADDRESS: [address] [place] [facility] [address]   05/31/2018 -   Chief Complaint  Patient presents with   Follow-up    Doing well at this time.     HPI DORMAN CLINKSCALES 77 y.o. -presents for follow-up of cough variant asthma.  Last visit we put him back on Asmanex after his cough deteriorated.  This was in August 2019.  With this his cough is significantly improved RSI cough score is 5.  He realizes the value of taking Asmanex.  He takes his Asmanex 1 puff 2 times daily.  The only interim issues that March 09, 2018 he ended up in the ER lab review shows normal.  It was for dizziness.  Socially his wife Dennie Bible has had liver cyst surgery at [facility] in October 2019 and is doing well.  He is up-to-date with his flu shot but our internal immunization record shows that he has not had pneumonia vaccine.        OV 02/20/2019  Subjective:  Patient ID: UBER BARWICK, male , DOB: 07-03-1946 , age 38 y.o. , MRN: [id number] , ADDRESS: [address] [place] [facility] [id number]   02/20/2019 -   Chief Complaint  Patient presents with   Cough    Having more trouble with phlegm.     HPI ALLYSON HOLZHAUSEN 77 y.o. -presents for chronic cough and cough variant asthma.  He is a former smoker 42 pack.  Last chest x-ray was August 2019.  At that time it was clear.  No previous CT scan of the chest.  He tells me that overall he is doing stable although in the last 6 weeks he has had increasing phlegm production.  It is not acute.  He has been socially isolating.  He has not come into contact with anyone with COVID.  He does go to church but mostly does [employer].  His RSI cough score shows significant deterioration as documented below.  The phlegm is not green but more like white.    OV 06/16/2020  Subjective:  Patient ID: GIO LOCKE, male , DOB: 04-21-1946 , age 1 y.o. , MRN: [id number] , ADDRESS: [address] [place] [address] PCP  Althea Charon Netta Neat, DO Patient Care Team: Smitty Cords, DO as PCP - General (Family Medicine) Corky Crafts, MD as PCP - Cardiology (Cardiology)  This Provider for this visit:  Treatment Team:  Attending Provider: Kalman Shan, MD    06/16/2020 -   Chief Complaint  Patient presents with   Follow-up    Pt states he had a flare up with asthma about 1 week ago which turned into pneumonia. Pt states he is now better. Has an occ cough which is better. Denies any complaints of wheezing or increased SOB.   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 -presents for follow-up.  Last seen in July 2020.  That was by me myself.  He presents now for follow-up.  He was supposed to go to [place] but because of Covid pandemic the history of his been postponed to March 2022.  He tells me it was the end of October 2021 he developed an asthma flare.  He is unclear why he had a flareup.  No sick contacts.  After that he played golf in [place] and this made it worse.  On number second 2020 when he had a chest x-ray with Dr. Kirtland Bouchard his primary care physician that showed some infiltrates.  He got antibiotics and prednisone.  This helped him.  Symptoms are improving but now he feels the cough is getting worse again.  He is wondering if it is his chronic cough getting worse.  Overall he continues to be compliant with his Asmanex.       Lab Results  Component Value Date   NITRICOXIDE 39 02/28/2018     OV 01/16/2021  Subjective:  Patient ID: [name], male , DOB: [birth year] , age 15 y.o. , MRN: [id number] , ADDRESS: [id number] [place] [facility] [address] PCP Althea Charon Netta Neat, DO Patient Care Team: Smitty Cords, DO as PCP - General (Family Medicine) Corky Crafts, MD as PCP - Cardiology (Cardiology)  This Provider for this visit: Treatment  Team:  Attending Provider: Kalman Shan, MD    01/16/2021 -   Chief Complaint  Patient presents with   Follow-up    Patient reports that he was on prednisone about 6 months and worse in last 6 months, Yellow sputum at times when coughing.    Chronic cough with cough variant asthma on Asmanex  HPI JAHKEEM PAYNTER 77 y.o. -presents for follow-up.  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  He tells me since the early part of this year has had frequent exacerbations of cough requiring prednisone.  He had 1 course of prednisone in January and another 1 in February and then again in March before he went to [place] and after he came back from [place].  Each of this helped him but the most recent 1 after his retirement did not help him.  He says and also developed shortness of breath particularly while playing golf.  His cough is worse when he lies down particularly on his back or on the left side.  He says cough is so bad it wakes up his wife.  His RSI cough scores are significant deterioration.  He is not sure about wheezing.  Overall he feels deconditioned.  The cough makes him fatigued.  When he was in [place] he actually was feeling better.  He went to [place] around March 2022.  There is no fever.  He had a CT scan of the chest end of last year that showed no evidence of ILD.  Review of his labs indicate slight eosinophilia.  His nitric oxide exhaled test today  was elevated at 40 ppb which is in the gray zone.  He says he is compliant with his Asmanex.  On exam he did have crackles which I thought was present for the first time.  There is no edema orthopnea    Simple office walk 185 feet x  3 laps goal with forehead probe 01/16/2021   O2 used ra  Number laps completed 3  Comments about pace fast  Resting Pulse Ox/HR 98% and 96/min  Final Pulse Ox/HR 98% and 99/min  Desaturated </= 88% no  Desaturated <= 3% points no  Got Tachycardic >/= 90/min yes  Symptoms at end of test Mild ydspnea  Miscellaneous  comments x   Results for SUNAY, CHOCK" (MRN [id number]) as of 01/16/2021 11:16  Ref. Range 03/09/2018 18:36 03/09/2018 18:45 06/02/2020 08:52 10/01/2020 09:26  Eosinophils Absolute Latest Ref Range: 15 - 500 cells/uL 0.4  205    Lab Results  Component Value Date   NITRICOXIDE 40 01/16/2021     IMPRESSION: HRCT Dec 2021 1. No evidence of interstitial lung disease. No acute pulmonary disease. 2. Stable 4 mm left upper lobe solid pulmonary nodule, considered benign. 3. Mild centrilobular and paraseptal emphysema with diffuse bronchial wall thickening and saber sheath trachea, compatible with the provided history of COPD. 4. Two vessel coronary atherosclerosis. 5. Aortic Atherosclerosis (ICD10-I70.0) and Emphysema (ICD10-J43.9).     Electronically Signed   By: Delbert Phenix M.D.   On: 07/24/2020 11:08     ROS - per HPI   OV 03/23/2021  Subjective:  Patient ID: [name], male , DOB: [birth year] , age 75 y.o. 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 -returns for follow-up.  In the year 2022 his cough is dramatically escalated.  It is now chronic.  He had several phone calls and phone messages because of this.  He see nurse practitioner 2 times.  2 visits ago the nurse practitioner started several nasal sprays and Breo but despite all this the cough persists unchanged.  She even added omeprazole but the  cough persist unchanged.  He has upcoming GI appointment locally.  He is frustrated by this.  Wife is here with him.  This is a social embarrassment and also concern in the family.  He has not seen ENT at all.  We recommended [facility] but it is 57 miles away from his home.  He is willing to see Dr. Doran Heater here in [place] who has expertise in chronic cough.  But he also wants to go through his GI appointment.  We discussed trial of asthma biologic empiric in case his refractory cough is because of uncontrolled asthma.  Is because he does cough at night and also brings up colored sputum occasionally.  We discussed the other option of doing gabapentin.  He wants to think about all this at this point.  He is willing to go through Dr.  Appointments.  He is worried about side effects of medications.  We could consider sputum evaluation at next visit.  There are no other new issues.  He did have imaging CT sinus and CT chest other than micronodules and some emphysematous allCLEAR.      OV 05/29/2021  Subjective:  Patient ID: [name], male , DOB: Dec 14, 1945 , age 1 y.o. , MRN: [id number] , ADDRESS: [address] [place] [facility] [address] PCP Althea Charon Netta Neat, DO Patient Care Team: Smitty Cords, DO as PCP - General (Family Medicine) Corky Crafts, MD as PCP - Cardiology (Cardiology)  This Provider for this visit: Treatment Team:  Attending Provider: Kalman Shan, MD    05/29/2021 -   Chief Complaint  Patient presents with   Follow-up    Productive cough with yellow sputum.    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  CT sinus August 2022 -Negative blood allergy test June 2022 with normal IgE currently on a 14-day prescription clindamycin course through ENT  HPI ARTORIUS EGELAND 77 y.o. -evaluation of follow-up chronic  cough.  On 05/21/2021 did see ENTLouise Ingegerd Charlotta Nordbladh .  Notes reviewed.  He continues to be bothered by chronic cough.  He did have a fiberoptic exam and showed a low mucopurulence in the nasopharynx he has been started on a 14-day clindamycin course.  He is midway through it.  He has not noticed any difference.  He is wondering after reading article on the Internet but he has chronic tracheitis.  Informed him that laryngoscope he should pick this up and he should talk about this with the ENT if physician assistant.  Review of the records indicate that his vocal cords look normal with normal mobility and this hypopharynx was also normal.  At this point in time he has resolved that he will consider gabapentin if his cough does not improve.  But at this point he also wants to continue with the ENT plan.  I agree with this.  He continues on Trelegy.  I did inform him that this can paradoxically cause cough to become worse.     Lab Results  Component Value Date   NITRICOXIDE 40 01/16/2021     CT Chest data  No results found.  OV 11/20/2021  Subjective:  Patient ID: RAMAL MCKETHAN, male , DOB: 10/22/1945 , age 54 y.o. , MRN: [id number] , ADDRESS: [address] [place] [facility] [address] PCP Althea Charon Netta Neat, DO Patient Care Team: Smitty Cords, DO as PCP - General (Family Medicine) Corky Crafts, MD as PCP - Cardiology (Cardiology)  This Provider for this visit: Treatment Team:  Attending Provider: Kalman Shan, MD    11/20/2021 -   Chief Complaint  Patient presents with   Follow-up    Pt states he has been coughing up a lot of phlegm recently that is yellow in color that is thick in consistency and also has some SOB when he has coughing fits as well as has had problems with wheezing.   Chronic refractory cough  HPI KOHLER AGERTON 77 y.o. -returns for follow-up.  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  He says the clindamycin that he took with  ENT has not helped.  1 month ago ENT put him on gabapentin this was Dr. Mcneil Sober no.  Has been taking gabapentin for a month but this is also  not helped.  He is frustrated by it.  He is not clear as to what exactly makes his cough worse or better.  He does say that when he lies down the cough does get precipitated.  In the middle of the night it does not wake him up but when he wakes up he will have a cough.  He does have some thick yellow stringy sputum.  Small in amount.  RSI cough score is unchanged 22.  Now for the last 1 year or so his cough has been significantly worse than baseline.  It is affecting his quality of life.  He also has back pain.  He believes COVID-vaccine has made his cough worse.  We did a nitric oxide test and is borderline elevated at 36.  We discussed options.  He is agreed for bronchoscopy with lavage. Risks of pneumothorax, hemothorax, sedation/anesthesia complications such as cardiac or respiratory arrest or hypotension, stroke and bleeding all explained. Benefits of diagnosis but limitations of non-diagnosis also explained. Patient verbalized understanding and wished to proceed.   We also discussed about empiric biologic treatment for asthma depending on the bronc results and he is agreeable for this.    He is at his wits end   12/02/2021 Patient has chronic cough. HRCT in August 2022 showed no definitive ILD. Subtle changes at lung bases felt to be non specific but could suggest mild recurrent aspiration. Multiple tiny 2-63mm pulmonary nodules which were felt to reflect mucoid impaction. Mild diffuse bronchial wall thickening with mild centrilobular and paraseptal emphysema. He underwent bronchoscopy with Dr. Marchelle Gearing on 11/26/21. BAL grew out Pseudomonas, streptococcus pneumonia and serratia. He recently had prolonged abx use with ENT, likely need IV antibiotics such as cefepime x 12 days and one more for strep coverage. He previously was noted to have a drug allergy to  ciprofloxacin. He tells me he had previously been on cipro and morphine after a shoulder surgery in 1990s and lost signifcaint amount of weight. Both medications were listed as an allergy. He has since been on cipro without issues, allergy has been removed from his list.   He reports no improvement in cough with Neurontin. Vocal cord examination by ENT in October 2022 was normal. No significant breathing issues. He denies shortness of breath, chest tightness or wheezing. He is fairly inactive d/t back pain. He would like to change inhalers as Asmanex is tier 3 drug and not formulary on this plan. FENO was 36 in April.   01/15/2022 Patient presents today for 6 week follow-up.  BAL grew out three separate microorganisms (Pseudomonas, streptococcus pneumonia and serratia). Referred to infectious disease. Last saw on 12/29/21, started on cipro 750mg .  He is feeling a lot better. Reports improvement in amount of congestion and cough. He gets up phlegm 1-3 times a day. He is sleeping better. He saw no difference with Spiriva Respimat so he stopped taking this. Using hypertonic sodium chloride neb twice a day. No issues when he use nebulizer in the morning but in the evening he notices his ankles swell. He showed me a picture of his ankles which were significantly swollen. He has trace to no leg swelling today in office.   Pulmonary function testing in 2022 showed no evidence of restrictive or obstructive lung disease.  He has no symptoms of shortness of breath or wheezing.  For now okay to stay off daily maintenance inhaler.  We will discontinue Asmanex, budesonide and Spiriva.  Continue hypertonic saline nebulizer as needed for increased  chest congestion.  We will add flutter valve 3 times daily to loosen congestion.  May consider adding therapy vest in the future to help mobilize secretions if patient continues with cough.  Bronchiectasis with acute lower respiratory infection (HCC) - Chronic cough. Patient  underwent bronchoscopy on 11/26/21, BAL grew out three separate microorganisms (Pseudomonas, streptococcus pneumonia and serratia). Following with infectious disease, started on oral ciprofloxacin. He has seen significant improvement in his cough and amount of congestion. Continue hypertonic saline nebulizer as needed for increased chest congestion.  We will add flutter valve 3 times daily to loosen congestion.  May consider adding therapy vest in the future to help mobilize secretions if patient continues with cough.   Centrilobular emphysema (HCC) - Pulmonary function testing in 2022 showed no evidence of restrictive or obstructive lung disease.  He has no symptoms of shortness of breath or wheezing.  For now okay to stay off daily maintenance inhaler.  We will discontinue Asmanex, budesonide and Spiriva.   Lower extremity edema - Patient noticed increased LE edema since starting hypertonic saline nebulizer, mainly in the evening. He had trace swelling on exam today. Checking BNP and BMET. Advised he wear compression stockings.    03/10/2022 Patient presents today for 4-6 week follow-up. He is doing extremely well today. No more cough. Rarely has phlegm. He uses flutter as needed. He is off all inhalers and has not needed to use hypertonic saline nebulizer. He saw ID on 7/6, he was released from infectious disease.   04/26/2022 Presents today for acute office visit due to cough. Hx bronchiectasis and emphysema. He had bronchoscopy in May 2023 that grew out pseudomonas, strep pneumonia and serratia. He was treated with cipro x 7 week with infectious disease. Symptoms improved dramatically. He was released by ID. Cough has returned, he has had a productive cough x 2 months. Sputum is thick yellow. Associated post nasal drip. Not currently using nasal spray. He is not having any shortness of breath. No longer on maintenance inhaler for emphysema. Maintained on flutter valve as needed. He returned nebulizer  machine. PCP put him on zpack and prednisone in late september which he has finished. Lung function was normal in 2022. Needs pre-op clearance for spinal fusion on Oct 05/12/22.   04/28/2022 - Interim hx  Patient presents today for surgical risk assessment. He is planning for spinal fusion on October 05/12/22, surgery will be done in hospital setting and he plans to be admitted for 1-3 days. He was seen earlier this week for cough. We started him on ciprofloxacin to cover for pseudomonas/strep pneumonia. He had CXR on 04/26/22 which was normal. He is doing better today. Started cipro this morning. He started using nasal spray which has significantly helped PND symptoms. Cough has not been as bad today.  Imaging:  03/16/2021 HRCT- Mild diffuse bronchial wall thickening with mild central lobar and paraseptal emphysema.  Multiple tiny 2 to 3 mm pulmonary nodules scattered throughout the periphery of the lungs bilaterally.  May represent areas of mucoid impaction with thin terminal bronchioles.   OV 03/29/2023  Subjective:  Patient ID: TREVEION RECIO, male , DOB: 11/29/1945 , age 77 y.o. , MRN: [id number] , ADDRESS: [address] [place] [facility] [address] PCP Althea Charon Netta Neat, DO Patient Care Team: Smitty Cords, DO as PCP - General (Family Medicine) Corky Crafts, MD as PCP - Cardiology (Cardiology) Kalman Shan, MD as Consulting Physician (Pulmonary Disease)  This Provider for this visit: Treatment Team:  Attending  Provider: Kalman Shan, MD    03/29/2023 -   Chief Complaint  Patient presents with   Follow-up    F/up on cough, phlegm.     HPI FARD JACEK 77 y.o. -returns for follow-up.  Presents with his wife.  His last CT scan was in 2022 August.  I last saw him in April 2023 after they did bronchoscopy in May 2023.  He had triple organism that grew on 11/26/2021 Pseudomonas up to coccus and Serratia.  He saw Dr. Rivka Safer and infectious diseases.   She had a more prolonged ciprofloxacin.  This got rid of the cough.  He has since followed up with our nurse practitioner.  Review of the records indicate his cough is doing well.  However now he tells me that his cough is back since April 2024 and getting worse.  There is some amount of sputum that fills up "half a shot glass" in a day.  Cough is improved by taking cough medication.  Flutter valve helps release the mucus.  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  There is no fever or chills.  I personally visualized the CT scan from August 2022 and there is no ILD in it.  There is no lung cancer.  But it has been 2 years since he had 1.   Dr Gretta Cool Reflux Symptom Index (> 13-15 suggestive of LPR cough) Results for KAILI, ZINDEL (MRN [id number]) as of 05/31/2018 09:46  Ref. Range 03/09/2018 18:36 03/09/2018 18:45  Eosinophils Absolute Latest Ref Range: 0.0 - 0.7 K/uL 0.4    0 -> 5  =  none ->severe problem.td 07/08/2017  08/18/2017  02/28/2018  05/31/2018 asmanex 1 bid 02/20/2019  01/16/2021  03/23/2021  11/20/2021 Broch and then triple organism+ 03/29/2023   Hoarseness of problem with voice 1 2 2 3 3 1 1 1  0  Clearing  Of Throat 3 2 2  0 2 3 2 3 2   Excess throat mucus or feeling of post nasal drip 1 0 3 0 3 5 4 5  0  Difficulty swallowing food, liquid or tablets 0 0 0 0 0 0 0 0 0  Cough after eating or lying down 4 1 5 1 1 5 5 4 4   Breathing difficulties or choking episodes 0 0 0 0 0 4 3 4  `  Troublesome or annoying cough 5 2 5 1 3 5 5 5 5   Sensation of something sticking in throat or lump in throat 0 0 0 0 0 0 0 0 0  Heartburn, chest pain, indigestion, or stomach acid coming up 0 0 0 0 0 0 2 0 0  TOTAL 14 7 17 5 12 23 22 22 12    PFT     Latest Ref Rng & Units 01/27/2021    2:18 PM  PFT Results  FVC-Pre L 3.31   FVC-Predicted Pre % 84   FVC-Post L 3.48   FVC-Predicted Post % 89   Pre FEV1/FVC % % 82   Post FEV1/FCV % % 78    FEV1-Pre L 2.70   FEV1-Predicted Pre % 95   FEV1-Post L 2.73   DLCO uncorrected ml/min/mmHg 28.84   DLCO UNC% % 122   DLCO corrected ml/min/mmHg 29.53   DLCO COR %Predicted % 125   DLVA Predicted % 120   TLC L 6.62   TLC % Predicted % 99   RV % Predicted % 95  LAB RESULTS last 96 hours No results found.  LAB RESULTS last 90 days Recent Results (from the past 2160 hour(s))  Bladder Scan (Post Void Residual) in office     Status: None   Collection Time: 01/12/23 10:28 AM  Result Value Ref Range   Scan Result          has a past medical history of Abnormal chest CT, Actinic keratosis, Allergic rhinitis, unspecified, Anemia, Aortic atherosclerosis (HCC), Arthritis, BPH (benign prostatic hyperplasia), COPD (chronic obstructive pulmonary disease) (HCC), Coronary artery disease, Cough variant asthma, Diabetes mellitus without complication (HCC), Glaucoma, Hypertension, Other chronic pain, Pancreatic lesion, Pneumonia, Pure hypercholesterolemia, unspecified, SCC (squamous cell carcinoma) (03/10/2021), SCC (squamous cell carcinoma) (03/10/2021), 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   reports that he quit smoking about 51 years ago. His smoking use included cigarettes. He started smoking about 77 years ago. He has a 78 pack-year smoking history. He has been exposed to tobacco smoke. He has never used smokeless tobacco.  Past Surgical History:  Procedure Laterality Date   APPLICATION OF INTRAOPERATIVE CT SCAN N/A 05/12/2022   Procedure: APPLICATION OF INTRAOPERATIVE CT SCAN;  Surgeon: Venetia Night, MD;  Location: [facility];  Service: Neurosurgery;  Laterality: N/A;   BACK SURGERY     BRONCHIAL WASHINGS  11/26/2021   Procedure: BRONCHIAL WASHINGS;  Surgeon: Kalman Shan, MD;  Location: [facility];  Service: Endoscopy;;   BUNIONECTOMY Left 2003   hammer toe as well   BUNIONECTOMY WITH HAMMERTOE  RECONSTRUCTION Left 2013   repeat   CERVICAL DISC SURGERY     COLONOSCOPY WITH PROPOFOL N/A 04/30/2021   Procedure: COLONOSCOPY WITH PROPOFOL;  Surgeon: Wyline Mood, MD;  Location: [facility];  Service: Gastroenterology;  Laterality: N/A;   GREEN LIGHT LASER TURP (TRANSURETHRAL RESECTION OF PROSTATE  2010   laser, shrink prostate   KNEE ARTHROSCOPY Right 1991   LEG SURGERY Left    BENIGN BONE TUMOR   LUMBAR DISC SURGERY  2010   discectomy   LUMBAR DISC SURGERY  2011   LUMBAR LAMINECTOMY/DECOMPRESSION MICRODISCECTOMY Right 05/12/2022   Procedure: RIGHT L5-S1 DISCECTOMY;  Surgeon: Venetia Night, MD;  Location: [facility];  Service: Neurosurgery;  Laterality: Right;   PROSTATE SURGERY  2002   shrink prostate   ROTATOR CUFF REPAIR Left 1999   debride, remove bonespur   ROTATOR CUFF REPAIR Right 1996   TONSILLECTOMY     TRANSFORAMINAL LUMBAR INTERBODY FUSION W/ MIS 1 LEVEL Right 05/12/2022   Procedure: RIGHT L4-5 MINIMALLY INVASIVE (MIS) TRANSFORAMINAL LUMBAR INTERBODY FUSION (TLIF);  Surgeon: Venetia Night, MD;  Location: [facility];  Service: Neurosurgery;  Laterality: Right;   VIDEO BRONCHOSCOPY N/A 11/26/2021   Procedure: VIDEO BRONCHOSCOPY WITHOUT FLUORO;  Surgeon: Kalman Shan, MD;  Location: [facility];  Service: Endoscopy;  Laterality: N/A;  for chroni ccough BAL    Allergies  Allergen Reactions   Morphine Diarrhea, Nausea And Vomiting and Other (See Comments)   Morphine And Codeine Diarrhea and Nausea And Vomiting    Immunization History  Administered Date(s) Administered   Fluad Quad(high Dose 65+) 05/13/2019, 06/09/2020, 04/21/2021, 04/22/2022   Influenza, High Dose Seasonal PF 07/15/2017, 04/12/2018   PFIZER(Purple Top)SARS-COV-2 Vaccination 02/13/2020, 03/05/2020   Pneumococcal Conjugate-13 01/28/2014   Pneumococcal Polysaccharide-23 03/04/2008, 08/26/2020   Tdap 07/12/2011   Zoster Recombinant(Shingrix) 04/12/2017, 08/17/2017    Family History   Problem Relation Age of Onset   Heart disease Mother 47   Emphysema Father 34  Heart disease Brother 7     Current Outpatient Medications:    albuterol (VENTOLIN HFA) 108 (90 Base) MCG/ACT inhaler, Inhale 2 puffs into the lungs every 6 (six) hours as needed for wheezing or shortness of breath., Disp: 6.7 g, Rfl: 0   amLODipine (NORVASC) 5 MG tablet, Take 1 tablet (5 mg total) by mouth daily., Disp: 90 tablet, Rfl: 1   Blood Glucose Monitoring Suppl (ONE TOUCH ULTRA 2) w/Device KIT, Use to check blood sugar up to 2 times daily, Disp: 1 kit, Rfl: 0   ferrous sulfate 325 (65 FE) MG tablet, Take 325 mg by mouth daily with breakfast., Disp: , Rfl:    Glucosamine-Chondroitin (GLUCOSAMINE CHONDR COMPLEX PO), Take 1 capsule by mouth 2 (two) times daily., Disp: , Rfl:    hydrochlorothiazide (HYDRODIURIL) 12.5 MG tablet, Take 1 tablet (12.5 mg total) by mouth daily., Disp: 90 tablet, Rfl: 1   ketoconazole (NIZORAL) 2 % shampoo, Apply to scalp and let sit for 10 minutes then wash off, use 3 times a week., Disp: 120 mL, Rfl: 10   losartan (COZAAR) 50 MG tablet, Take 1 tablet (50 mg total) by mouth daily., Disp: 90 tablet, Rfl: 3   metFORMIN (GLUCOPHAGE-XR) 500 MG 24 hr tablet, Take 2 tablets (1,000 mg total) by mouth daily with breakfast., Disp: 180 tablet, Rfl: 3   Multiple Minerals-Vitamins (CAL MAG ZINC +D3 PO), Take 1 tablet by mouth every evening., Disp: , Rfl:    Multiple Vitamin (MULTIVITAMIN WITH MINERALS) TABS tablet, Take 1 tablet by mouth daily., Disp: , Rfl:    ONETOUCH ULTRA test strip, Use to check blood sugar up to twice per day, Disp: 200 each, Rfl: 5   rosuvastatin (CRESTOR) 5 MG tablet, Take 1 tablet (5 mg total) by mouth daily., Disp: 90 tablet, Rfl: 3   tamsulosin (FLOMAX) 0.4 MG CAPS capsule, Take 1 capsule (0.4 mg total) by mouth daily., Disp: 90 capsule, Rfl: 3      Objective:   Vitals:   03/29/23 1555  BP: 130/70  Pulse: (!) 106  SpO2: 98%  Weight: 166 lb 3.2 oz (75.4  kg)  Height: 5\' 9"  (1.753 m)    Estimated body mass index is 24.54 kg/m as calculated from the following:   Height as of this encounter: 5\' 9"  (1.753 m).   Weight as of this encounter: 166 lb 3.2 oz (75.4 kg).  @WEIGHTCHANGE @  [employer]   03/29/23 1555  Weight: 166 lb 3.2 oz (75.4 kg)     Physical Exam   General: No distress. Looks well O2 at rest: no Cane present: no Sitting in wheel chair: no Frail: no Obese: no Neuro: Alert and Oriented x 3. GCS 15. Speech normal Psych: Pleasant Resp:  Barrel Chest - no.  Wheeze - no, Crackles - no. COARSE, No overt respiratory distress CVS: Normal heart sounds. Murmurs - no Ext: Stigmata of Connective Tissue Disease - no HEENT: Normal upper airway. PEERL +. No post nasal drip        Assessment:       ICD-10-CM   1. Chronic cough  R05.3     2. Bronchiectasis without complication (HCC)  J47.9          Plan:     Patient Instructions     ICD-10-CM   1. Chronic cough  R05.3     2. 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 Marchelle Gearing or nurse practitioner in 4 weeks to discuss test results  -Hopefully can avoid bronchoscopy    FOLLOWUP Return in about 4 weeks (around 04/26/2023) for 15 min visit, after HRCT chest, VIDEO VISIT, with Dr Marchelle Gearing.    SIGNATURE    Dr. Kalman Shan, M.D., F.C.C.P,  Pulmonary and Critical Care Medicine Staff Physician, [facility] Center Director - Interstitial Lung Disease  Program  Pulmonary Fibrosis [facility] at  [facility] [place], [place], [address]  Pager: [phone], If no answer or between  15:00h - 7:00h: call [phone] Telephone: [phone]  4:41 PM 03/29/2023

## 2023-03-30 ENCOUNTER — Encounter: Payer: Self-pay | Admitting: Internal Medicine

## 2023-03-30 ENCOUNTER — Other Ambulatory Visit
Admission: RE | Admit: 2023-03-30 | Discharge: 2023-03-30 | Disposition: A | Discharge status: Home / Self Care | Payer: PPO | Source: Ambulatory Visit | Attending: Internal Medicine | Admitting: Internal Medicine

## 2023-03-30 ENCOUNTER — Other Ambulatory Visit: Payer: Self-pay

## 2023-03-30 ENCOUNTER — Telehealth: Payer: Self-pay | Admitting: Internal Medicine

## 2023-03-30 DIAGNOSIS — J479 Bronchiectasis, uncomplicated: Secondary | ICD-10-CM | POA: Diagnosis not present

## 2023-03-30 DIAGNOSIS — R053 Chronic cough: Secondary | ICD-10-CM | POA: Diagnosis not present

## 2023-03-30 LAB — EXPECTORATED SPUTUM ASSESSMENT W GRAM STAIN, RFLX TO RESP C

## 2023-03-30 NOTE — Telephone Encounter (Signed)
Lab at [facility] called. Sputum sample provided is unacceptable. Please have PT resubmit.

## 2023-03-31 ENCOUNTER — Other Ambulatory Visit (HOSPITAL_COMMUNITY): Payer: Self-pay

## 2023-03-31 ENCOUNTER — Other Ambulatory Visit: Payer: Self-pay

## 2023-03-31 LAB — ACID FAST SMEAR (AFB, MYCOBACTERIA): Acid Fast Smear: NEGATIVE

## 2023-03-31 NOTE — Telephone Encounter (Signed)
Ok just start cipro

## 2023-04-01 ENCOUNTER — Telehealth: Payer: Self-pay | Admitting: Internal Medicine

## 2023-04-01 ENCOUNTER — Encounter (HOSPITAL_COMMUNITY): Payer: Self-pay

## 2023-04-01 ENCOUNTER — Other Ambulatory Visit
Admission: RE | Admit: 2023-04-01 | Discharge: 2023-04-01 | Disposition: A | Discharge status: Home / Self Care | Payer: PPO | Source: Ambulatory Visit | Attending: Internal Medicine | Admitting: Internal Medicine

## 2023-04-01 ENCOUNTER — Other Ambulatory Visit (HOSPITAL_COMMUNITY): Payer: Self-pay

## 2023-04-01 DIAGNOSIS — R053 Chronic cough: Secondary | ICD-10-CM | POA: Diagnosis not present

## 2023-04-01 DIAGNOSIS — J479 Bronchiectasis, uncomplicated: Secondary | ICD-10-CM | POA: Diagnosis not present

## 2023-04-01 LAB — EXPECTORATED SPUTUM ASSESSMENT W GRAM STAIN, RFLX TO RESP C

## 2023-04-01 MED ORDER — CIPROFLOXACIN HCL 750 MG PO TABS
750.0000 mg | ORAL_TABLET | Freq: Two times a day (BID) | ORAL | 0 refills | Status: DC
  Filled 2023-04-01 (×2): qty 24, 12d supply, fill #0
  Filled ????-??-??: fill #0

## 2023-04-01 NOTE — Telephone Encounter (Signed)
[facility] is calling about a sample that they received from this patient. It does not have enough sputum so they will have to cancel the order and need a new one.

## 2023-04-01 NOTE — Telephone Encounter (Signed)
Patient has supplied additional sample.

## 2023-04-04 NOTE — Group Note (Deleted)

## 2023-04-06 NOTE — Telephone Encounter (Signed)
Patient had already been made aware. This is a duplicate message. Dr. Marchelle Gearing has already addressed.   Dr Marchelle Gearing said it was ok for you to start the Cipro.  Lowella Bandy M,CMA

## 2023-04-10 ENCOUNTER — Encounter: Payer: Self-pay | Admitting: Internal Medicine

## 2023-04-16 ENCOUNTER — Other Ambulatory Visit (HOSPITAL_COMMUNITY): Payer: Self-pay

## 2023-04-20 ENCOUNTER — Ambulatory Visit
Admission: RE | Admit: 2023-04-20 | Discharge: 2023-04-20 | Disposition: A | Discharge status: Home / Self Care | Payer: PPO | Source: Ambulatory Visit | Attending: Internal Medicine | Admitting: Internal Medicine

## 2023-04-20 DIAGNOSIS — J479 Bronchiectasis, uncomplicated: Secondary | ICD-10-CM | POA: Diagnosis not present

## 2023-04-20 DIAGNOSIS — R590 Localized enlarged lymph nodes: Secondary | ICD-10-CM | POA: Diagnosis not present

## 2023-04-20 DIAGNOSIS — J432 Centrilobular emphysema: Secondary | ICD-10-CM | POA: Diagnosis not present

## 2023-04-21 ENCOUNTER — Ambulatory Visit (INDEPENDENT_AMBULATORY_CARE_PROVIDER_SITE_OTHER): Payer: PPO

## 2023-04-21 DIAGNOSIS — Z23 Encounter for immunization: Secondary | ICD-10-CM

## 2023-04-22 ENCOUNTER — Ambulatory Visit (INDEPENDENT_AMBULATORY_CARE_PROVIDER_SITE_OTHER): Payer: PPO

## 2023-04-22 VITALS — BP 100/58 | Ht 69.0 in | Wt 164.0 lb

## 2023-04-22 DIAGNOSIS — Z0000 Encounter for general adult medical examination without abnormal findings: Secondary | ICD-10-CM

## 2023-04-22 NOTE — Patient Instructions (Addendum)
Isaac Weber , Thank you for taking time to come for your Medicare Wellness Visit. I appreciate your ongoing commitment to your health goals. Please review the following plan we discussed and let me know if I can assist you in the future.   Referrals/Orders/Follow-Ups/Clinician Recommendations: none  This is a list of the screening recommended for you and due dates:  Health Maintenance  Topic Date Due   Hepatitis C Screening  06/09/2024*   Hemoglobin A1C  06/18/2023   Yearly kidney health urinalysis for diabetes  06/25/2023   Complete foot exam   06/25/2023   Eye exam for diabetics  11/18/2023   Yearly kidney function blood test for diabetes  12/16/2023   Medicare Annual Wellness Visit  04/21/2024   Colon Cancer Screening  04/30/2024   Pneumonia Vaccine  Completed   Flu Shot  Completed   Zoster (Shingles) Vaccine  Completed   HPV Vaccine  Aged Out   DTaP/Tdap/Td vaccine  Discontinued   COVID-19 Vaccine  Discontinued  *Topic was postponed. The date shown is not the original due date.    Advanced directives: (ACP Link)Information on Advanced Care Planning can be found at [facility] of [facility] Directives Advance Health Care Directives ([email or web address]   Next Medicare Annual Wellness Visit scheduled for next year: Yes   04/26/24 @ 11:15 am in person

## 2023-04-22 NOTE — Progress Notes (Signed)
Subjective:   Isaac Weber is a 77 y.o. male who presents for Medicare Annual/Subsequent preventive examination.  Visit Complete: In person  Cardiac Risk Factors include: advanced age (>51men, >37 women);diabetes mellitus;dyslipidemia;hypertension;male gender     Objective:    Today's Vitals   04/22/23 0849 04/22/23 0854  BP: (!) 100/58   Weight: 164 lb (74.4 kg)   Height: 5\' 9"  (1.753 m)   PainSc:  4    Body mass index is 24.22 kg/m.     04/22/2023    9:00 AM 05/12/2022    8:30 AM 05/03/2022    1:18 PM 04/16/2022    9:00 AM 04/14/2022    1:42 PM 03/24/2022    1:00 PM 02/17/2022   11:03 AM  Advanced Directives  Does Patient Have a Medical Advance Directive? No Yes Yes No No No No  Type of Special educational needs teacher of [place];Living will       Does patient want to make changes to medical advance directive?  No - Patient declined       Copy of Healthcare Power of Attorney in Chart?  No - copy requested       Would patient like information on creating a medical advance directive? No - Patient declined   No - Patient declined No - Patient declined No - Patient declined No - Patient declined    Current Medications (verified) Outpatient Encounter Medications as of 04/22/2023  Medication Sig   albuterol (VENTOLIN HFA) 108 (90 Base) MCG/ACT inhaler Inhale 2 puffs into the lungs every 6 (six) hours as needed for wheezing or shortness of breath.   amLODipine (NORVASC) 5 MG tablet Take 1 tablet (5 mg total) by mouth daily.   Blood Glucose Monitoring Suppl (ONE TOUCH ULTRA 2) w/Device KIT Use to check blood sugar up to 2 times daily   ferrous sulfate 325 (65 FE) MG tablet Take 325 mg by mouth daily with breakfast.   Glucosamine-Chondroitin (GLUCOSAMINE CHONDR COMPLEX PO) Take 1 capsule by mouth 2 (two) times daily.   hydrochlorothiazide (HYDRODIURIL) 12.5 MG tablet Take 1 tablet (12.5 mg total) by mouth daily.   ketoconazole (NIZORAL) 2 % shampoo Apply to scalp and let  sit for 10 minutes then wash off, use 3 times a week.   losartan (COZAAR) 50 MG tablet Take 1 tablet (50 mg total) by mouth daily.   metFORMIN (GLUCOPHAGE-XR) 500 MG 24 hr tablet Take 2 tablets (1,000 mg total) by mouth daily with breakfast.   Multiple Minerals-Vitamins (CAL MAG ZINC +D3 PO) Take 1 tablet by mouth every evening.   Multiple Vitamin (MULTIVITAMIN WITH MINERALS) TABS tablet Take 1 tablet by mouth daily.   ONETOUCH ULTRA test strip Use to check blood sugar up to twice per day   rosuvastatin (CRESTOR) 5 MG tablet Take 1 tablet (5 mg total) by mouth daily.   tamsulosin (FLOMAX) 0.4 MG CAPS capsule Take 1 capsule (0.4 mg total) by mouth daily.   ciprofloxacin (CIPRO) 750 MG tablet Take 1 tablet (750 mg total) by mouth 2 (two) times daily. (Patient not taking: Reported on 04/22/2023)   No facility-administered encounter medications on file as of 04/22/2023.    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 WD SCC, ulcerated. [facility] 04/29/2020   Past Surgical History:  Procedure Laterality Date   APPLICATION OF INTRAOPERATIVE CT SCAN N/A 05/12/2022    Procedure: APPLICATION OF INTRAOPERATIVE CT SCAN;  Surgeon: Venetia Night, MD;  Location: [facility] ORS;  Service: Neurosurgery;  Laterality: N/A;   BACK SURGERY     BRONCHIAL WASHINGS  11/26/2021   Procedure: BRONCHIAL WASHINGS;  Surgeon: Kalman Shan, MD;  Location: [facility] ENDOSCOPY;  Service: Endoscopy;;   BUNIONECTOMY Left 2003   hammer toe as well   BUNIONECTOMY WITH HAMMERTOE RECONSTRUCTION Left 2013   repeat   CERVICAL DISC SURGERY     COLONOSCOPY WITH PROPOFOL N/A 04/30/2021   Procedure: COLONOSCOPY WITH PROPOFOL;  Surgeon: Wyline Mood, MD;  Location: [facility] ENDOSCOPY;  Service: Gastroenterology;  Laterality: N/A;   GREEN LIGHT LASER TURP (TRANSURETHRAL RESECTION OF PROSTATE  2010   laser, shrink prostate   KNEE ARTHROSCOPY Right 1991   LEG SURGERY Left    BENIGN BONE TUMOR   LUMBAR DISC SURGERY  2010   discectomy   LUMBAR DISC SURGERY  2011   LUMBAR LAMINECTOMY/DECOMPRESSION MICRODISCECTOMY Right 05/12/2022   Procedure: RIGHT L5-S1 DISCECTOMY;  Surgeon: Venetia Night, MD;  Location: [facility] ORS;  Service: Neurosurgery;  Laterality: Right;   PROSTATE SURGERY  2002   shrink prostate   ROTATOR CUFF REPAIR Left 1999   debride, remove bonespur   ROTATOR CUFF REPAIR Right 1996   TONSILLECTOMY     TRANSFORAMINAL LUMBAR INTERBODY FUSION W/ MIS 1 LEVEL Right 05/12/2022   Procedure: RIGHT L4-5 MINIMALLY INVASIVE (MIS) TRANSFORAMINAL LUMBAR INTERBODY FUSION (TLIF);  Surgeon: Venetia Night, MD;  Location: [facility] ORS;  Service: Neurosurgery;  Laterality: Right;   VIDEO BRONCHOSCOPY N/A 11/26/2021   Procedure: VIDEO BRONCHOSCOPY WITHOUT FLUORO;  Surgeon: Kalman Shan, MD;  Location: [facility]OSCOPY;  Service: Endoscopy;  Laterality: N/A;  for chroni ccough BAL   Family History  Problem Relation Age of Onset   Heart disease Mother 81   Emphysema Father 78   Heart disease Brother 65   Social History   Socioeconomic History   Marital status: Married    Spouse name: Abby Stines   Number of children: Not on file   Years of education: 12   Highest education level: 12th grade  Occupational History   Occupation: retired  Tobacco Use   Smoking status: Former    Current packs/day: 0.00    Average packs/day: 3.0 packs/day for 26.0 years (78.0 ttl pk-yrs)    Types: Cigarettes    Start date: 09/16/1945    Quit date: 09/17/1971    Years since quitting: 51.6    Passive exposure: Past   Smokeless tobacco: Never  Vaping Use   Vaping status: Never Used  Substance and Sexual Activity   Alcohol use: Yes    Alcohol/week: 2.0 standard  drinks of alcohol    Types: 2 Standard drinks or equivalent per week   Drug use: Never   Sexual activity: Yes    Partners: Female  Other Topics Concern   Not on file  Social History Narrative   Not on file   Social Determinants of Health   Financial Resource Strain: Low Risk  (04/22/2023)   Overall Financial Resource Strain (CARDIA)    Difficulty of Paying Living Expenses: Not hard at all  Food Insecurity: No Food Insecurity (04/22/2023)   Hunger Vital Sign    Worried About Running Out of Food in the Last Year: Never true    Ran Out of Food in the Last Year: Never true  Transportation Needs: No Transportation Needs (04/22/2023)   PRAPARE - Administrator, Civil Service (Medical): No    Lack of Transportation (Non-Medical): No  Physical Activity: Sufficiently Active (04/22/2023)   Exercise Vital Sign    Days of Exercise per Week: 3 days    Minutes of Exercise per Session: 60 min  Stress: No Stress Concern Present (04/22/2023)   [employer] of Occupational Health - Occupational Stress Questionnaire    Feeling of Stress : Not at all  Social Connections: Socially Integrated (04/22/2023)   Social Connection and Isolation Panel [NHANES]    Frequency of Communication with Friends and Family: More than three times a week    Frequency of Social Gatherings with Friends and Family: Three times a week    Attends  Religious Services: More than 4 times per year    Active Member of Clubs or Organizations: Yes    Attends Engineer, structural: More than 4 times per year    Marital Status: Married    Tobacco Counseling Counseling given: Not Answered   Clinical Intake:  Pre-visit preparation completed: Yes  Pain : 0-10 Pain Score: 4  Pain Type: Chronic pain Pain Location: Shoulder Pain Orientation: Right, Left Pain Descriptors / Indicators: Aching, Constant Pain Onset: More than a month ago Pain Frequency: Constant Pain Relieving Factors: motrin  Pain Relieving Factors: motrin  Nutritional Status: BMI of 19-24  Normal Nutritional Risks: None Diabetes: Yes CBG done?: No Did pt. bring in CBG monitor from home?: No  How often do you need to have someone help you when you read instructions, pamphlets, or other written materials from your doctor or pharmacy?: 1 - Never  Interpreter Needed?: No  Information entered by :: Kennedy Bucker, LPN   Activities of Daily Living    04/22/2023    9:00 AM 04/18/2023    9:04 AM  In your present state of health, do you have any difficulty performing the following activities:  Hearing? 0 0  Vision? 0 0  Difficulty concentrating or making decisions? 0 0  Walking or climbing stairs? 0 0  Dressing or bathing? 0 0  Doing errands, shopping? 0 0  Preparing Food and eating ? N N  Using the Toilet? N N  In the past six months, have you accidently leaked urine? N N  Do you have problems with loss of bowel control? N N  Managing your Medications? N N  Managing your Finances? N N  Housekeeping or managing your Housekeeping? N N    Patient Care Team: Smitty Cords, DO as PCP - General (Family Medicine) Corky Crafts, MD as PCP - Cardiology (Cardiology) Kalman Shan, MD as Consulting Physician (Pulmonary Disease)  Indicate any recent Medical Services you may have received from other than  [facility] providers in the past year  (date may be approximate).     Assessment:   This is a routine wellness examination for Yee.  Hearing/Vision screen Hearing Screening - Comments:: No aids Vision Screening - Comments:: Wears glasses- Dr.Woods in [place]   Goals Addressed             This Visit's Progress    DIET - INCREASE WATER INTAKE         Depression Screen    04/22/2023    8:57 AM 02/25/2023    9:46 AM 12/23/2022    9:25 AM 06/24/2022    9:05 AM 04/16/2022    8:59 AM 04/14/2022    1:41 PM 03/24/2022    1:00 PM  PHQ 2/9 Scores  PHQ - 2 Score 0 0 0 0 0 0 0  PHQ- 9 Score 0 0  0 0      Fall Risk    04/22/2023    9:00 AM 04/18/2023    9:04 AM 02/25/2023    9:45 AM 12/23/2022    9:25 AM 06/24/2022    9:05 AM  Fall Risk   Falls in the past year? 0 0 0 0 0  Number falls in past yr: 0 0 0  0  Injury with Fall? 0 0 0  0  Risk for fall due to : No Fall Risks  No Fall Risks  No Fall Risks  Follow up Falls prevention discussed;Falls evaluation completed  Falls evaluation completed  Falls evaluation completed    MEDICARE RISK AT HOME: Medicare Risk at Home Any stairs in or around the home?: No If so, are there any without handrails?: No Home free of loose throw rugs in walkways, pet beds, electrical cords, etc?: Yes Adequate lighting in your home to reduce risk of falls?: Yes Life alert?: No Use of a cane, walker or w/c?: No Grab bars in the bathroom?: Yes Shower chair or bench in shower?: Yes Elevated toilet seat or a handicapped toilet?: Yes  TIMED UP AND GO:  Was the test performed?  Yes  Length of time to ambulate 10 feet: 4 sec Gait steady and fast without use of assistive device    Cognitive Function:        04/22/2023    9:02 AM 04/16/2022    9:09 AM 04/07/2021    2:09 PM 03/18/2020    2:05 PM  6CIT Screen  What Year? 0 points 0 points 0 points 0 points  What month? 0 points 0 points 0 points 0 points  What time? 0 points 0 points 0 points 0 points  Count back from 20 0 points 0 points  0 points 0 points  Months in reverse 0 points 0 points 0 points 0 points  Repeat phrase 0 points 0 points 4 points 4 points  Total Score 0 points 0 points 4 points 4 points    Immunizations Immunization History  Administered Date(s) Administered   Fluad Quad(high Dose 65+) 05/13/2019, 06/09/2020, 04/21/2021, 04/22/2022   Fluad Trivalent(High Dose 65+) 04/21/2023   Influenza, High Dose Seasonal PF 07/15/2017, 04/12/2018   PFIZER(Purple Top)SARS-COV-2 Vaccination 02/13/2020, 03/05/2020   Pneumococcal Conjugate-13 01/28/2014   Pneumococcal Polysaccharide-23 03/04/2008, 08/26/2020   Tdap 07/12/2011   Zoster Recombinant(Shingrix) 04/12/2017, 08/17/2017    TDAP status: Due, Education has been provided regarding the importance of this vaccine. Advised may receive this vaccine at local pharmacy or Health Dept. Aware to provide a copy of the vaccination record if obtained from local pharmacy  or Health Dept. Verbalized acceptance and understanding.  Flu Vaccine status: Up to date  Pneumococcal vaccine status: Up to date  Covid-19 vaccine status: Completed vaccines  Qualifies for Shingles Vaccine? Yes   Zostavax completed No   Shingrix Completed?: Yes  Screening Tests Health Maintenance  Topic Date Due   Hepatitis C Screening  06/09/2024 (Originally 02/23/1964)   HEMOGLOBIN A1C  06/18/2023   Diabetic kidney evaluation - Urine ACR  06/25/2023   FOOT EXAM  06/25/2023   OPHTHALMOLOGY EXAM  11/18/2023   Diabetic kidney evaluation - eGFR measurement  12/16/2023   Medicare Annual Wellness (AWV)  04/21/2024   Colonoscopy  04/30/2024   Pneumonia Vaccine 25+ Years old  Completed   INFLUENZA VACCINE  Completed   Zoster Vaccines- Shingrix  Completed   HPV VACCINES  Aged Out   DTaP/Tdap/Td  Discontinued   COVID-19 Vaccine  Discontinued    Health Maintenance  There are no preventive care reminders to display for this patient.   Colorectal cancer screening: No longer required.   Lung  Cancer Screening: (Low Dose CT Chest recommended if Age 38-80 years, 20 pack-year currently smoking OR have quit w/in 15years.) does not qualify.     Additional Screening:  Hepatitis C Screening: does qualify; Completed no  Vision Screening: Recommended annual ophthalmology exams for early detection of glaucoma and other disorders of the eye. Is the patient up to date with their annual eye exam?  Yes  Who is the provider or what is the name of the office in which the patient attends annual eye exams? Dr.Woods in [place] If pt is not established with a provider, would they like to be referred to a provider to establish care? No .   Dental Screening: Recommended annual dental exams for proper oral hygiene  Diabetic Foot Exam: Diabetic Foot Exam: Completed 06/24/22  Community Resource Referral / Chronic Care Management: CRR required this visit?  No   CCM required this visit?  No     Plan:     I have personally reviewed and noted the following in the patient's chart:   Medical and social history Use of alcohol, tobacco or illicit drugs  Current medications and supplements including opioid prescriptions. Patient is not currently taking opioid prescriptions. Functional ability and status Nutritional status Physical activity Advanced directives List of other physicians Hospitalizations, surgeries, and ER visits in previous 12 months Vitals Screenings to include cognitive, depression, and falls Referrals and appointments  In addition, I have reviewed and discussed with patient certain preventive protocols, quality metrics, and best practice recommendations. A written personalized care plan for preventive services as well as general preventive health recommendations were provided to patient.     Hal Hope, LPN   03/05/9146   After Visit Summary: my chart  Nurse Notes: none

## 2023-04-26 ENCOUNTER — Other Ambulatory Visit (HOSPITAL_COMMUNITY): Payer: Self-pay

## 2023-05-04 ENCOUNTER — Telehealth: Payer: PPO | Admitting: Primary Care

## 2023-05-04 ENCOUNTER — Telehealth: Payer: Self-pay | Admitting: Primary Care

## 2023-05-04 ENCOUNTER — Encounter: Payer: Self-pay | Admitting: Primary Care

## 2023-05-04 VITALS — BP 112/75 | HR 90 | Ht 69.0 in | Wt 160.0 lb

## 2023-05-04 DIAGNOSIS — R053 Chronic cough: Secondary | ICD-10-CM | POA: Diagnosis not present

## 2023-05-04 NOTE — Telephone Encounter (Signed)
I think a repat bronch is not a bad idea. Might be a few weeks due to scheudle issues in OR but let him know and send message back to me and I can schedule it

## 2023-05-04 NOTE — Telephone Encounter (Signed)
HRCT showed no compelling evidence of ILD, mild scarring in lung bases. Mild diffuse bronchial wall thickening with mild paraseptal and centrilobular emphysema suggestive of COPD  - He continues to have a cough with some purulent production. Cough onset 2016 after a cruise. He was unable to provide adequate sputum sample. Took course of Ciprofloxacin without much noticeable improvement in cough. Not currently on any inhalers, never found them to be effective. Prednisone has helped in the past. They would like to reconsider bronchoscopy for evaluation of chronic cough.   Do you want to set this up sometime with you?  My guess is you probably want to hold off on him taking any prednisone prior to procedure or do you want me to try

## 2023-05-04 NOTE — H&P (View-Only) (Signed)
Virtual Visit via Video Note  I connected with Isaac Weber on 05/04/23 at  8:30 AM EDT by a video enabled telemedicine application and verified that I am speaking with the correct person using two identifiers.  Location: Patient: Home Provider: Office    I discussed the limitations of evaluation and management by telemedicine and the availability of in person appointments. The patient expressed understanding and agreed to proceed.  History of Present Illness: 77 year old, former smoker quit in 1973 (78 pack year hx).  Past medical history significant for hypertension, emphysema, cough variant asthma, hyperlipidemia, iron deficiency anemia. Patient of Dr. Marchelle Gearing.   03/29/2023 -   Chief Complaint  Patient presents with   Follow-up    F/up on cough, phlegm.   Isaac Weber 77 y.o. -returns for follow-up.  Presents with his wife.  His last CT scan was in 2022 August.  I last saw him in April 2023 after they did bronchoscopy in May 2023.  He had triple organism that grew on 11/26/2021 Pseudomonas up to coccus and Serratia.  He saw Dr. Rivka Safer and infectious diseases.  She had a more prolonged ciprofloxacin.  This got rid of the cough.  He has since followed up with our nurse practitioner.  Review of the records indicate his cough is doing well.  However now he tells me that his cough is back since April 2024 and getting worse.  There is some amount of sputum that fills up "half a shot glass" in a day.  Cough is improved by taking cough medication.  Flutter valve helps release the mucus.  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  There is no fever or chills.  I personally visualized the CT scan from August 2022 and there is no ILD in it.  There is no lung cancer.  But it has been 2 years since he had 1.   Dr Gretta Cool Reflux Symptom Index (> 13-15 suggestive of LPR cough) Results for [name], [name] (MRN [id number]) as of 05/31/2018  09:46  Ref. Range 03/09/2018 18:36 03/09/2018 18:45  Eosinophils Absolute Latest Ref Range: 0.0 - 0.7 K/uL 0.4    0 -> 5  =  none ->severe problem.td 07/08/2017  08/18/2017  02/28/2018  05/31/2018 asmanex 1 bid 02/20/2019  01/16/2021  03/23/2021  11/20/2021 Broch and then triple organism+ 03/29/2023   Hoarseness of problem with voice 1 2 2 3 3 1 1 1  0  Clearing  Of Throat 3 2 2  0 2 3 2 3 2   Excess throat mucus or feeling of post nasal drip 1 0 3 0 3 5 4 5  0  Difficulty swallowing food, liquid or tablets 0 0 0 0 0 0 0 0 0  Cough after eating or lying down 4 1 5 1 1 5 5 4 4   Breathing difficulties or choking episodes 0 0 0 0 0 4 3 4  `  Troublesome or annoying cough 5 2 5 1 3 5 5 5 5   Sensation of something sticking in throat or lump in throat 0 0 0 0 0 0 0 0 0  Heartburn, chest pain, indigestion, or stomach acid coming up 0 0 0 0 0 0 2 0 0  TOTAL 14 7 17 5 12 23 22 22 12        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 Marchelle Gearing or nurse practitioner in 4 weeks to discuss test results             -Hopefully can avoid bronchoscopy    05/04/2023-Interim hx  - Patient contacted today for 78 month follow-up/cough.  - Unable to produce sputum culture, started on ciprofloxacin 9/6 - HRCT showed no compelling evidence of ILD, mild scarring in lung bases. Mild diffuse bronchial wall thickening with mild paraseptal and centrilobular emphysema suggestive of COPD - He continues to have a cough with some purulent production. Cough onset 2016 after a cruise. He was unable to provide adequate sputum  sample. Took course of Ciprofloxacin without much noticeable improvement in cough. Not currently on any inhalers, never found them to be effective. Prednisone has helped in the past. They would like to reconsider bronchoscopy for evaluation of chronic cough.   Observations/Objective:  Appears well; No overt respirator symptoms or shortness of breath    Assessment and Plan:  Chronic bronchitis:  - Onset in 2016 after cruise. Inhalers have not been beneficial. Prednisone has helped in the past for short period of time. Unable to produce sputum culture, completed course of Ciprofloxacin in September 2024 without improvement  - HRCT 04/20/23 showed no compelling evidence of ILD, mild scarring in lung bases. Mild diffuse bronchial wall thickening with mild paraseptal and centrilobular emphysema suggestive of COPD - We will discuss getting patient set up for bronchoscopy with Dr. Marchelle Gearing to evaluate chronic cough   Follow Up Instructions:   Follow up after procedure  I discussed the assessment and treatment plan with the patient. The patient was provided an opportunity to ask questions and all were answered. The patient agreed with the plan and demonstrated an understanding of the instructions.   The patient was advised to call back or seek an in-person evaluation if the symptoms worsen or if the condition fails to improve as anticipated.  I provided 22 minutes of non-face-to-face time during this encounter.   [name], NP

## 2023-05-04 NOTE — Progress Notes (Signed)
Virtual Visit via Video Note  I connected with Isaac Weber on 05/04/23 at  8:30 AM EDT by a video enabled telemedicine application and verified that I am speaking with the correct person using two identifiers.  Location: Patient: Home Provider: Office    I discussed the limitations of evaluation and management by telemedicine and the availability of in person appointments. The patient expressed understanding and agreed to proceed.  History of Present Illness: 77 year old, former smoker quit in 1973 (78 pack year hx).  Past medical history significant for hypertension, emphysema, cough variant asthma, hyperlipidemia, iron deficiency anemia. Patient of Dr. Marchelle Gearing.   03/29/2023 -   Chief Complaint  Patient presents with   Follow-up    F/up on cough, phlegm.   Isaac Weber 77 y.o. -returns for follow-up.  Presents with his wife.  His last CT scan was in 2022 August.  I last saw him in April 2023 after they did bronchoscopy in May 2023.  He had triple organism that grew on 11/26/2021 Pseudomonas up to coccus and Serratia.  He saw Dr. Rivka Safer and infectious diseases.  She had a more prolonged ciprofloxacin.  This got rid of the cough.  He has since followed up with our nurse practitioner.  Review of the records indicate his cough is doing well.  However now he tells me that his cough is back since April 2024 and getting worse.  There is some amount of sputum that fills up "half a shot glass" in a day.  Cough is improved by taking cough medication.  Flutter valve helps release the mucus.  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  There is no fever or chills.  I personally visualized the CT scan from August 2022 and there is no ILD in it.  There is no lung cancer.  But it has been 2 years since he had 1.   Dr Gretta Cool Reflux Symptom Index (> 13-15 suggestive of LPR cough) Results for Isaac, Weber (MRN [id number]) as of 05/31/2018  09:46  Ref. Range 03/09/2018 18:36 03/09/2018 18:45  Eosinophils Absolute Latest Ref Range: 0.0 - 0.7 K/uL 0.4    0 -> 5  =  none ->severe problem.td 07/08/2017  08/18/2017  02/28/2018  05/31/2018 asmanex 1 bid 02/20/2019  01/16/2021  03/23/2021  11/20/2021 Broch and then triple organism+ 03/29/2023   Hoarseness of problem with voice 1 2 2 3 3 1 1 1  0  Clearing  Of Throat 3 2 2  0 2 3 2 3 2   Excess throat mucus or feeling of post nasal drip 1 0 3 0 3 5 4 5  0  Difficulty swallowing food, liquid or tablets 0 0 0 0 0 0 0 0 0  Cough after eating or lying down 4 1 5 1 1 5 5 4 4   Breathing difficulties or choking episodes 0 0 0 0 0 4 3 4  `  Troublesome or annoying cough 5 2 5 1 3 5 5 5 5   Sensation of something sticking in throat or lump in throat 0 0 0 0 0 0 0 0 0  Heartburn, chest pain, indigestion, or stomach acid coming up 0 0 0 0 0 0 2 0 0  TOTAL 14 7 17 5 12 23 22 22 12        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 Marchelle Gearing or nurse practitioner in 4 weeks to discuss test results             -Hopefully can avoid bronchoscopy    05/04/2023-Interim hx  - Patient contacted today for 1 month follow-up/cough.  - Unable to produce sputum culture, started on ciprofloxacin 9/6 - HRCT showed no compelling evidence of ILD, mild scarring in lung bases. Mild diffuse bronchial wall thickening with mild paraseptal and centrilobular emphysema suggestive of COPD - He continues to have a cough with some purulent production. Cough onset 2016 after a cruise. He was unable to provide adequate sputum  sample. Took course of Ciprofloxacin without much noticeable improvement in cough. Not currently on any inhalers, never found them to be effective. Prednisone has helped in the past. They would like to reconsider bronchoscopy for evaluation of chronic cough.   Observations/Objective:  Appears well; No overt respirator symptoms or shortness of breath    Assessment and Plan:  Chronic bronchitis:  - Onset in 2016 after cruise. Inhalers have not been beneficial. Prednisone has helped in the past for short period of time. Unable to produce sputum culture, completed course of Ciprofloxacin in September 2024 without improvement  - HRCT 04/20/23 showed no compelling evidence of ILD, mild scarring in lung bases. Mild diffuse bronchial wall thickening with mild paraseptal and centrilobular emphysema suggestive of COPD - We will discuss getting patient set up for bronchoscopy with Dr. Marchelle Gearing to evaluate chronic cough   Follow Up Instructions:   Follow up after procedure  I discussed the assessment and treatment plan with the patient. The patient was provided an opportunity to ask questions and all were answered. The patient agreed with the plan and demonstrated an understanding of the instructions.   The patient was advised to call back or seek an in-person evaluation if the symptoms worsen or if the condition fails to improve as anticipated.  I provided 22 minutes of non-face-to-face time during this encounter.   [name], NP

## 2023-05-04 NOTE — Telephone Encounter (Signed)
He would like to proceed with bronchoscopy. He will be out of town from Oct 20-28th  [facility] with wait time if needed due to scheduling

## 2023-05-12 NOTE — Telephone Encounter (Signed)
I will work on this.

## 2023-05-12 NOTE — Telephone Encounter (Signed)
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  No Size of Scope: Regular Pre-med nebulized lidocaine: Not for general Priority: Give me any dates in November other than second and fourth Tuesday of each month -I have clinic schedule mostly in November 2024 and so I have to do it when I am not in clinic.  Give me at least 5-7 choices   Location: [place] or [facility] Does patient have OSA? no DM? no Or Latex allergy? no Medication Restriction: N.p.o. in the morning except medications Anticoagulate/Antiplatelet: Hold any aspirin Plavix or Eliquis or Xarelto Pre-op Labs Ordered: None rec imagi  ng request: none    Allergies  Allergen Reactions   Morphine Diarrhea, Nausea And Vomiting and Other (See Comments)   Morphine And Codeine Diarrhea and Nausea And Vomiting      MISCELLANEOUS KEY INSTRUCTIONS    No need for COVID testing  Please let Dr Marchelle Gearing know via reply phone message on Epic  Thank you     Key patient medical info     Allergy History:  Allergies  Allergen Reactions   Morphine Diarrhea, Nausea And Vomiting and Other (See Comments)   Morphine And Codeine Diarrhea and Nausea And Vomiting     Current Outpatient Medications:    albuterol (VENTOLIN HFA) 108 (90 Base) MCG/ACT inhaler, Inhale 2 puffs into the lungs every 6 (six) hours as needed for wheezing or shortness of breath., Disp: 6.7 g, Rfl: 0   amLODipine (NORVASC) 5 MG tablet, Take 1 tablet (5 mg total) by mouth daily., Disp: 90 tablet, Rfl: 1   Blood Glucose Monitoring Suppl (ONE TOUCH ULTRA 2) w/Device KIT, Use to  check blood sugar up to 2 times daily, Disp: 1 kit, Rfl: 0   ferrous sulfate 325 (65 FE) MG tablet, Take 325 mg by mouth daily with breakfast., Disp: , Rfl:    Glucosamine-Chondroitin (GLUCOSAMINE CHONDR COMPLEX PO), Take 1 capsule by mouth 2 (two) times daily., Disp: , Rfl:    hydrochlorothiazide (HYDRODIURIL) 12.5 MG tablet, Take 1 tablet (12.5 mg total) by mouth daily., Disp: 90 tablet, Rfl: 1   ketoconazole (NIZORAL) 2 % shampoo, Apply to scalp and let sit for 10 minutes then wash off, use 3 times a week., Disp: 120 mL, Rfl: 10   losartan (COZAAR) 50 MG tablet, Take 1 tablet (50 mg total) by mouth daily., Disp: 90 tablet, Rfl: 3   metFORMIN (GLUCOPHAGE-XR) 500 MG 24 hr tablet, Take 2 tablets (1,000 mg total) by mouth daily with breakfast., Disp: 180 tablet, Rfl: 3   Multiple Minerals-Vitamins (CAL MAG ZINC +D3 PO), Take 1 tablet by mouth every evening., Disp: , Rfl:    Multiple Vitamin (MULTIVITAMIN WITH MINERALS) TABS tablet, Take 1 tablet by mouth daily., Disp: , Rfl:    ONETOUCH ULTRA test strip, Use to check blood sugar up to twice per day, Disp: 200 each, Rfl: 5   rosuvastatin (CRESTOR) 5 MG tablet, Take 1 tablet (5 mg total) by mouth daily., Disp: 90 tablet, Rfl: 3   tamsulosin (FLOMAX) 0.4 MG CAPS capsule, Take 1 capsule (0.4 mg total) by mouth daily., Disp: 90 capsule, Rfl: 3   has a past medical history of  Abnormal chest CT, Actinic keratosis, Allergic rhinitis, unspecified, Anemia, Aortic atherosclerosis (HCC), Arthritis, BPH (benign prostatic hyperplasia), COPD (chronic obstructive pulmonary disease) (HCC), Coronary artery disease, Cough variant asthma, Diabetes mellitus without complication (HCC), Glaucoma, Hypertension, Other chronic pain, Pancreatic lesion, Pneumonia, Pure hypercholesterolemia, unspecified, SCC (squamous cell carcinoma) (03/10/2021), SCC (squamous cell carcinoma) (03/10/2021), Squamous cell carcinoma of skin (07/23/2019), and Squamous cell carcinoma of skin  (02/21/2020).    has a past surgical history that includes Knee arthroscopy (Right, 1991); Bunionectomy (Left, 2003); Prostate surgery (2002); Rotator cuff repair (Left, 1999); Cervical disc surgery; Leg Surgery (Left); Rotator cuff repair (Right, 1996); Green light laser turp (transurethral resection of prostate (2010); Lumbar disc surgery (2010); Bunionectomy with hammertoe reconstruction (Left, 2013); Lumbar disc surgery (2011); Back surgery; Colonoscopy with propofol (N/A, 04/30/2021); Video bronchoscopy (N/A, 11/26/2021); Bronchial washings (11/26/2021); Tonsillectomy; Transforaminal lumbar interbody fusion w/ mis 1 level (Right, 05/12/2022); Lumbar laminectomy/decompression microdiscectomy (Right, 05/12/2022); and Application of intraoperative CT scan (N/A, 05/12/2022).   SIGNATURE    Dr. Kalman Shan, M.D., F.C.C.P,  Pulmonary and Critical Care Medicine Staff Physician, [facility] Center Director - Interstitial Lung Disease  Program  Pulmonary Fibrosis [facility] at [facility] [place], [place], [address]  Pager: [phone], If no answer or between  15:00h - 7:00h: call [phone] Telephone: [phone]  9:52 AM 05/12/2023

## 2023-05-13 LAB — ACID FAST CULTURE WITH REFLEXED SENSITIVITIES (MYCOBACTERIA): Acid Fast Culture: NEGATIVE

## 2023-05-17 NOTE — Telephone Encounter (Signed)
Spoke to Endo they do not have the availability on other days for this Bronch only days open is 06/07/23 and 06/21/23 at 12 these are block spots for [place] due to anesthesia issues

## 2023-05-23 ENCOUNTER — Other Ambulatory Visit (HOSPITAL_COMMUNITY): Payer: Self-pay

## 2023-05-23 ENCOUNTER — Other Ambulatory Visit: Payer: Self-pay

## 2023-05-24 ENCOUNTER — Encounter: Payer: Self-pay | Admitting: Internal Medicine

## 2023-05-24 NOTE — Telephone Encounter (Signed)
Lmam for patient also typed letter and left that on the machine to tell patient he can view it in [place]

## 2023-05-26 ENCOUNTER — Encounter (HOSPITAL_COMMUNITY): Payer: Self-pay | Admitting: Internal Medicine

## 2023-05-29 NOTE — Anesthesia Preprocedure Evaluation (Signed)
Anesthesia Evaluation  Patient identified by MRN, date of birth, ID band Patient awake    Reviewed: Allergy & Precautions, NPO status , Patient's Chart, lab work & pertinent test results  Airway Mallampati: I  TM Distance: >3 FB Neck ROM: Full    Dental no notable dental hx. (+) Upper Dentures   Pulmonary pneumonia, resolved, COPD, former smoker   Pulmonary exam normal breath sounds clear to auscultation       Cardiovascular hypertension, Pt. on medications + CAD  Normal cardiovascular exam Rhythm:Regular Rate:Normal     Neuro/Psych    GI/Hepatic   Endo/Other  diabetes, Type 2, Oral Hypoglycemic Agents    Renal/GU      Musculoskeletal  (+) Arthritis ,    Abdominal   Peds  Hematology   Anesthesia Other Findings   Reproductive/Obstetrics                             Anesthesia Physical Anesthesia Plan  ASA: 3  Anesthesia Plan: General   Post-op Pain Management:    Induction: Intravenous  PONV Risk Score and Plan: 3 and Treatment may vary due to age or medical condition, Midazolam, Ondansetron and Dexamethasone  Airway Management Planned: Oral ETT  Additional Equipment: None  Intra-op Plan:   Post-operative Plan: Extubation in OR  Informed Consent: I have reviewed the patients History and Physical, chart, labs and discussed the procedure including the risks, benefits and alternatives for the proposed anesthesia with the patient or authorized representative who has indicated his/her understanding and acceptance.     Dental advisory given  Plan Discussed with:   Anesthesia Plan Comments:         Anesthesia Quick Evaluation

## 2023-05-30 ENCOUNTER — Other Ambulatory Visit: Payer: Self-pay

## 2023-05-30 ENCOUNTER — Ambulatory Visit (HOSPITAL_COMMUNITY): Payer: PPO | Admitting: Anesthesiology

## 2023-05-30 ENCOUNTER — Ambulatory Visit (HOSPITAL_COMMUNITY): Payer: PPO

## 2023-05-30 ENCOUNTER — Encounter (HOSPITAL_COMMUNITY): Payer: Self-pay | Admitting: Internal Medicine

## 2023-05-30 ENCOUNTER — Ambulatory Visit (HOSPITAL_COMMUNITY)
Admission: RE | Admit: 2023-05-30 | Discharge: 2023-05-30 | Disposition: A | Discharge status: Home / Self Care | Payer: PPO | Attending: Internal Medicine | Admitting: Internal Medicine

## 2023-05-30 ENCOUNTER — Encounter (HOSPITAL_COMMUNITY)
Admission: RE | Disposition: A | Discharge status: Home / Self Care | Payer: Self-pay | Source: Home / Self Care | Attending: Internal Medicine

## 2023-05-30 ENCOUNTER — Ambulatory Visit (HOSPITAL_BASED_OUTPATIENT_CLINIC_OR_DEPARTMENT_OTHER): Payer: PPO | Admitting: Anesthesiology

## 2023-05-30 DIAGNOSIS — R053 Chronic cough: Secondary | ICD-10-CM | POA: Diagnosis not present

## 2023-05-30 DIAGNOSIS — Z48813 Encounter for surgical aftercare following surgery on the respiratory system: Secondary | ICD-10-CM | POA: Diagnosis not present

## 2023-05-30 DIAGNOSIS — J449 Chronic obstructive pulmonary disease, unspecified: Secondary | ICD-10-CM | POA: Insufficient documentation

## 2023-05-30 DIAGNOSIS — Z87891 Personal history of nicotine dependence: Secondary | ICD-10-CM | POA: Diagnosis not present

## 2023-05-30 DIAGNOSIS — E119 Type 2 diabetes mellitus without complications: Secondary | ICD-10-CM | POA: Diagnosis not present

## 2023-05-30 DIAGNOSIS — E785 Hyperlipidemia, unspecified: Secondary | ICD-10-CM | POA: Insufficient documentation

## 2023-05-30 DIAGNOSIS — Z2239 Carrier of other specified bacterial diseases: Secondary | ICD-10-CM

## 2023-05-30 DIAGNOSIS — J42 Unspecified chronic bronchitis: Secondary | ICD-10-CM | POA: Diagnosis not present

## 2023-05-30 DIAGNOSIS — I10 Essential (primary) hypertension: Secondary | ICD-10-CM | POA: Diagnosis not present

## 2023-05-30 DIAGNOSIS — Z7984 Long term (current) use of oral hypoglycemic drugs: Secondary | ICD-10-CM | POA: Diagnosis not present

## 2023-05-30 HISTORY — PX: BRONCHIAL WASHINGS: SHX5105

## 2023-05-30 HISTORY — PX: VIDEO BRONCHOSCOPY: SHX5072

## 2023-05-30 LAB — BODY FLUID CELL COUNT WITH DIFFERENTIAL
Eos, Fluid: 2 %
Lymphs, Fluid: 9 %
Monocyte-Macrophage-Serous Fluid: 4 % — ABNORMAL LOW (ref 50–90)
Neutrophil Count, Fluid: 85 % — ABNORMAL HIGH (ref 0–25)
Total Nucleated Cell Count, Fluid: 53 uL (ref 0–1000)

## 2023-05-30 LAB — GLUCOSE, CAPILLARY: Glucose-Capillary: 118 mg/dL — ABNORMAL HIGH (ref 70–99)

## 2023-05-30 SURGERY — VIDEO BRONCHOSCOPY WITHOUT FLUORO
Anesthesia: General

## 2023-05-30 MED ORDER — PROPOFOL 10 MG/ML IV BOLUS
INTRAVENOUS | Status: DC | PRN
  Administered 2023-05-30: 30 mg via INTRAVENOUS
  Administered 2023-05-30: 150 mg via INTRAVENOUS

## 2023-05-30 MED ORDER — ONDANSETRON HCL 4 MG/2ML IJ SOLN
INTRAMUSCULAR | Status: DC | PRN
  Administered 2023-05-30: 4 mg via INTRAVENOUS

## 2023-05-30 MED ORDER — FENTANYL CITRATE (PF) 100 MCG/2ML IJ SOLN
INTRAMUSCULAR | Status: AC
  Filled 2023-05-30: qty 2

## 2023-05-30 MED ORDER — CHLORHEXIDINE GLUCONATE 0.12 % MT SOLN
OROMUCOSAL | Status: AC
  Filled 2023-05-30: qty 15

## 2023-05-30 MED ORDER — LIDOCAINE HCL (CARDIAC) PF 100 MG/5ML IV SOSY
PREFILLED_SYRINGE | INTRAVENOUS | Status: DC | PRN
  Administered 2023-05-30: 50 mg via INTRAVENOUS

## 2023-05-30 MED ORDER — PROPOFOL 10 MG/ML IV BOLUS
INTRAVENOUS | Status: AC
Start: 2023-05-30 — End: ?
  Filled 2023-05-30: qty 20

## 2023-05-30 MED ORDER — DEXAMETHASONE SODIUM PHOSPHATE 10 MG/ML IJ SOLN
INTRAMUSCULAR | Status: DC | PRN
  Administered 2023-05-30: 10 mg via INTRAVENOUS

## 2023-05-30 MED ORDER — ROCURONIUM BROMIDE 100 MG/10ML IV SOLN
INTRAVENOUS | Status: DC | PRN
  Administered 2023-05-30: 45 mg via INTRAVENOUS

## 2023-05-30 MED ORDER — LACTATED RINGERS IV SOLN: INTRAVENOUS | Status: DC | PRN

## 2023-05-30 MED ORDER — SODIUM CHLORIDE 0.9 % IV SOLN: INTRAVENOUS | Status: DC

## 2023-05-30 MED ORDER — FENTANYL CITRATE (PF) 100 MCG/2ML IJ SOLN
INTRAMUSCULAR | Status: DC | PRN
  Administered 2023-05-30 (×2): 50 ug via INTRAVENOUS

## 2023-05-30 MED ORDER — SUGAMMADEX SODIUM 200 MG/2ML IV SOLN
INTRAVENOUS | Status: DC | PRN
  Administered 2023-05-30: 200 mg via INTRAVENOUS

## 2023-05-30 MED ORDER — CHLORHEXIDINE GLUCONATE 0.12 % MT SOLN
15.0000 mL | Freq: Once | OROMUCOSAL | Status: AC
  Administered 2023-05-30: 15 mL via OROMUCOSAL

## 2023-05-30 NOTE — Progress Notes (Signed)
Patient doing well. Vital signs good. No problems. No shortness of breath. Feel fine to go home. Dr. Dalbert Mayotte consulted and he is fine with patient going home.

## 2023-05-30 NOTE — Anesthesia Procedure Notes (Signed)
Procedure Name: Intubation Date/Time: 05/30/2023 11:28 AM  Performed by: Lezlie Lye, CRNAPre-anesthesia Checklist: Patient identified, Emergency Drugs available, Suction available and Patient being monitored Patient Re-evaluated:Patient Re-evaluated prior to induction Oxygen Delivery Method: Circle System Utilized Preoxygenation: Pre-oxygenation with 100% oxygen Induction Type: IV induction Ventilation: Mask ventilation without difficulty Laryngoscope Size: Miller and 3 Grade View: Grade I Tube type: Oral Tube size: 8.5 mm Number of attempts: 1 Airway Equipment and Method: Stylet and Oral airway Placement Confirmation: ETT inserted through vocal cords under direct vision, positive ETCO2 and breath sounds checked- equal and bilateral Secured at: 21 cm Tube secured with: Tape Dental Injury: Teeth and Oropharynx as per pre-operative assessment

## 2023-05-30 NOTE — Discharge Instructions (Addendum)
BRONCHOSCOPY DISCHARGE INSTRUCTIONS  - Please have someone to drive you home - Please be careful with activities for next 24 hours - You can eat 2-4 hours after getting home provided you are fully alert, able to cough, and are not nauseated or vomiting and     feel well - You are expected to have low grade fever or cough some amount of blood for next 24-48 hours; if this worsens call us - If you are very short of breath or coughing blood or chest pain or not feeling well, call us [phone] anytime or go to emergency room - For followup appointment - see below. If not there please call [phone] to make an appointment with Dr Marchelle Gearing or nurse practitioner   Future Appointments  Date Time Provider Department Center  06/13/2023  4:00 PM Kalman Shan, MD [facility] None  06/27/2023  8:00 AM [facility]  07/04/2023  1:20 PM Smitty Cords, DO [facility] P[facility]  09/05/2023  2:20 PM Debbe Odea, MD [facility] None  01/12/2024 10:00 AM Sondra Come, MD [facility] None  01/19/2024 10:15 AM Deirdre Evener, MD [facility] None  04/26/2024 11:15 AM [facility]-ANNUAL WELLNESS VISIT [facility]

## 2023-05-30 NOTE — Anesthesia Postprocedure Evaluation (Signed)
Anesthesia Post Note  Patient: QUINTAVIUS NIEBUHR  Procedure(s) Performed: VIDEO BRONCHOSCOPY WITHOUT FLUORO BRONCHIAL WASHINGS     Patient location during evaluation: Endoscopy Anesthesia Type: General Level of consciousness: awake and alert Pain management: pain level controlled Vital Signs Assessment: post-procedure vital signs reviewed and stable Respiratory status: spontaneous breathing, nonlabored ventilation, respiratory function stable and patient connected to nasal cannula oxygen Cardiovascular status: blood pressure returned to baseline and stable Postop Assessment: no apparent nausea or vomiting Anesthetic complications: no   No notable events documented.  Last Vitals:  Vitals:   05/30/23 1220 05/30/23 1225  BP: (!) 125/56   Pulse: 87 82  Resp: 14 18  Temp:    SpO2: 94% 95%    Last Pain:  Vitals:   05/30/23 1200  TempSrc:   PainSc: 0-No pain                 Trevor Iha

## 2023-05-30 NOTE — Interval H&P Note (Signed)
History and Physical Interval Note:  05/30/2023 10:05 AM  [name]  has presented today for surgery, with the diagnosis of CHRONIC COUGH W CHRONIC COLITIS AND POLYMICROBIAL CONIZATION.  The various methods of treatment have been discussed with the patient and family. After consideration of risks, benefits and other options for treatment, the patient has consented to  Procedure(s): VIDEO BRONCHOSCOPY WITHOUT FLUORO (N/A) as a surgical intervention.  The patient's history has been reviewed, patient examined, no change in status, stable for surgery.  I have reviewed the patient's chart and labs.  Questions were answered to the patient's satisfaction.     Presents today for bronchoscopy.  His history and physical is less than 53 days old because he saw my nurse practitioner 05/04/2023 discussed with me.  Since then no major interim issues but overall his cough is worse compared to the spring.  He did sprain his back and is going to go to the chiropractor today.  No hospitalizations or emergency room visits.  Objective - General: No distress. Looks well Neuro: Alert and Oriented x 3. GCS 15. Speech normal Psych: Pleasant Resp:  Barrel Chest - no.  Wheeze - no, Crackles - no, No overt respiratory distress CVS: Normal heart sounds. Murmurs - no Ext: Stigmata of Connective Tissue Disease - no HEENT: Normal upper airway. PEERL +. No post nasal drip  A -Chronic cough with polymicrobial colonization - Chronic bronchitis - Possible bronchiectasis  Plan - Bronchoscopy with lavage and airway examination.  Risks of pneumothorax, hemothorax, sedation/anesthesia complications such as cardiac or respiratory arrest or hypotension, stroke and bleeding all explained. Benefits of diagnosis but limitations of non-diagnosis also explained. Patient verbalized understanding and wished to proceed.      SIGNATURE    Dr. Kalman Shan, M.D., F.C.C.P,  Pulmonary and Critical Care Medicine Staff  Physician, [facility] Center Director - Interstitial Lung Disease  Program  Pulmonary Fibrosis [facility] at [facility] [place], [place], [address]   Pager: [phone], If no answer  -> Check AMION or Try [phone] Telephone (clinical office): [phone] Telephone (research): [phone]  10:09 AM 05/30/2023

## 2023-05-30 NOTE — Transfer of Care (Signed)
Immediate Anesthesia Transfer of Care Note  Patient: Isaac Weber  Procedure(s) Performed: VIDEO BRONCHOSCOPY WITHOUT FLUORO BRONCHIAL WASHINGS  Patient Location: PACU  Anesthesia Type:General  Level of Consciousness: awake, alert , oriented, and patient cooperative  Airway & Oxygen Therapy: Patient Spontanous Breathing and Patient connected to face mask oxygen  Post-op Assessment: Report given to RN and Post -op Vital signs reviewed and stable  Post vital signs: Reviewed and stable  Last Vitals:  Vitals Value Taken Time  BP 116/88 05/30/23 1150  Temp    Pulse 103 05/30/23 1152  Resp 25 05/30/23 1152  SpO2 100 % 05/30/23 1152  Vitals shown include unfiled device data.  Last Pain:  Vitals:   05/30/23 1149  TempSrc: (P) Temporal  PainSc:          Complications: No notable events documented.

## 2023-05-30 NOTE — Op Note (Addendum)
Name:  Isaac Weber MRN:  [id number] DOB:  May 08, 1946  PROCEDURE NOTE  Procedure(s): Flexible bronchoscopy [phone]) Bronchial alveolar lavage [phone]) of the Right Lower Lobe   Indications:  chronic cough, chronic bronchitis, chronic colonization  Consent:  Procedure, benefits, risks and alternatives discussed.  Questions answered.  Consent obtained.  Anesthesia:  General Anesthesia  Location: [facility]  Procedure summary:  Appropriate equipment was assembled.  The patient was brought to the procedure suite room and identified as Isaac Weber with Aug 14, 1945  Safety timeout was performed. The patient was placed supine on the  table, airway and GAL inistered by anthesisa team  After the appropriate level of moderation was assured, flexible video bronchoscope was lubricated and inserted through the ET Tube  Total of 0 mL of 1% Lidocaine were administered through the bronchoscope to augment moderate sedation  Airway examination was YES performed bilaterally to subsegmental level.  Some mucus noted in RMB and RLL were noted, mucosa appeared normal and NO endobronchial lesions were identified. The level of mucus was minimal and much improved compared to prior bronch  Bronchial alveolar lavage of the yes was performed with 20 mL of normal saline  x1  number of times for total volume of 5 ml return to cannister then, 50 mL x 2 BAL  Total return of 25 mL of fluid to  LEUKENS trap, after which the bronchoscope was withdrawn.      After hemostasis was assure, the bronchoscope was withdrawn.  The patient was recovered and then  transferred to recovery area  Post-procedure chest x-ray was NOT  ordered.  Specimens sent: Bronchial alveolar lavage specimen of the RLL for cell count  microbiology and cytology.  Complications:  No immediate complications were noted.  Hemodynamic parameters and oxygenation remained stable throughout the procedure.  Estimated blood loss:   none  IMPRESSION 1. Normal airwawy, Some mucus in RMB and RLL 2. BAL RLL 3. NO BIOPSY  Wife updated  Followup Future Appointments  Date Time Provider Department Center  06/13/2023  4:00 PM Kalman Shan, MD [facility] None  06/27/2023  8:00 AM [facility]  07/04/2023  1:20 PM Smitty Cords, DO [facility]  09/05/2023  2:20 PM Debbe Odea, MD [facility] None  01/12/2024 10:00 AM Sondra Come, MD [facility] None  01/19/2024 10:15 AM Deirdre Evener, MD [facility] None  04/26/2024 11:15 AM [facility]-ANNUAL WELLNESS VISIT [facility]     Dr. Kalman Shan, M.D., [facility].C.P Pulmonary and Critical Care Medicine Staff Physician [facility] Pulmonary and Critical Care Pager: [phone], If no answer or between  15:00h - 7:00h: call [phone]  05/30/2023 11:38 AM

## 2023-05-31 LAB — CYTOLOGY - NON PAP

## 2023-06-01 ENCOUNTER — Encounter (HOSPITAL_COMMUNITY): Payer: Self-pay | Admitting: Internal Medicine

## 2023-06-02 LAB — CULTURE, RESPIRATORY W GRAM STAIN: Culture: NO GROWTH

## 2023-06-03 NOTE — Progress Notes (Signed)
No growth so far.

## 2023-06-08 LAB — PNEUMOCYSTIS JIROVECI SMEAR BY DFA

## 2023-06-13 ENCOUNTER — Telehealth: Payer: Self-pay | Admitting: Internal Medicine

## 2023-06-13 ENCOUNTER — Encounter: Payer: Self-pay | Admitting: Internal Medicine

## 2023-06-13 ENCOUNTER — Ambulatory Visit: Payer: PPO | Admitting: Internal Medicine

## 2023-06-13 VITALS — BP 144/71 | HR 74 | Ht 69.0 in | Wt 164.6 lb

## 2023-06-13 DIAGNOSIS — R053 Chronic cough: Secondary | ICD-10-CM | POA: Diagnosis not present

## 2023-06-13 DIAGNOSIS — J479 Bronchiectasis, uncomplicated: Secondary | ICD-10-CM

## 2023-06-13 NOTE — Patient Instructions (Addendum)
    ICD-10-CM   1. Chronic cough  R05.3     2. Bronchiectasis without complication (HCC)  J47.9       Currently cough is at an acceptable level in terms of quality of life interference.  Bronchoscopy 05/30/2023 without any growth in culture but does show inflammatory cells on a chronic basis.  Multiple different treatment efforts have failed in the past.  Currently basis supportive care   Plan - New ongoing supportive care   Follow-up - 6 months or sooner if needed

## 2023-06-13 NOTE — Progress Notes (Signed)
PCP Isaac Rainier, MD  HPI    IOV  07/08/2017  Chief Complaint  Patient presents with   Advice Only    Referred by Dr. Zachery Dauer for cough x54months.  Pt states that his cough is  a dry cough. Pt was put on nexium x2 weeks for acid reflux which he states has loosened the mucus some.      77 year old male originally from [place] and a former Arts development officer.  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  He tells me that mid September 2018 he went to the beach.  Upon return from the beach his granddaughter was sick with a cough and subsequently on April 16, 2017 he abruptly developed a cough.  Since then the cough has persisted.  The only thing that has improved and his cough is that he is no longer having significant night cough although he still does have some  amount of night cough.  This night cough resolved after opioid cough syrup.  Cough is made worse by talking, laughing and also randomly.  It is associated with clearing of the throat.  He feels a tightness in the upper chest.  There is no associated wheezing but there is still some residual nocturnal symptoms.  There is no associated acid reflux.  He has been on Nexium for a week or 2 without much relief.  He has not tried anything for nasal but he denies any nasal discharge.  He is not on any ACE inhibitors.  He does not have a previous history of asthma but his exam nitric oxide today is elevated borderline at 44 ppb.  He does clear the throat.  The cough is annoying.  He tells me that he did have a chest x-ray with his primary care physician that was clear.  The history is obtained from him, talking to his wife and review of the primary care physician referral notes.  Lab review she had a hemoglobin 11.6 g% in February 2016 and a eosinophils f 300 cells.  He is a former smoker  He does not have any shortness of breath.  He walks over a mile.  Sometimes he will cough and a pro-air can help him.  He does find the [employer] helps him.  At this  point in time he prefers for conservative simplistic line of treatment   OV 08/18/2017  Chief Complaint  Patient presents with   Follow-up    Pt states he has been doing good since last visit. Cough is better.   Follow-up chronic cough  After last visit he decided to take Asmanex.  He also followed a diet and took Prilosec.  With this the cough is more than 60% better as documented in the RSI cough score.  He does not want to take any more new medications.  He feels prednisone burst helped him a lot in the initial few days.  He is wondering which of these measures have helped him the most.  There are no new issues.  He wants to liberalize his diet which he says is actually helping him with the sugars and with the cough but he would like to be less disciplined about food.  He is willing to continue with Asmanex   feno 43 ppb  OV 02/28/2018  Chief Complaint  Patient presents with   Follow-up    Pt states things had been doing good for him and did go off of the inhaler but stated after he had been  off of the inhaler for about a week, the cough came back in the mornings and now he states he is coughing all the time. He states he is still not taking the medication and he states he is still coughing.    Follow-up chronic cough  He is here with his wife. He tells me that approximately one month ago because he was feeling well without any cough he stopped his Asmanex and then 2 weeks ago his cough returned. RSI cough score is 17. When he lies downhe has a cough. But he does not wake up in the middle of the night because of cough. There is no associated wheezing or shortness of breath or chest tightness or any change in his health status. Social history: His wife is new diagnosis of liver cirrhosis. She is going MRI today. Exam nitric oxide is in the indeterminate range today for him. Review of the chart indicates last chest x-ray was in 2010.    OV 05/31/2018  Subjective:  Patient ID: Isaac Weber, male , DOB: 25-Sep-1945 , age 78 y.o. , MRN: [id number] , ADDRESS: [address] [place] [facility] [address]   05/31/2018 -   Chief Complaint  Patient presents with   Follow-up    Doing well at this time.     HPI Isaac Weber 77 y.o. -presents for follow-up of cough variant asthma.  Last visit we put him back on Asmanex after his cough deteriorated.  This was in August 2019.  With this his cough is significantly improved RSI cough score is 5.  He realizes the value of taking Asmanex.  He takes his Asmanex 1 puff 2 times daily.  The only interim issues that March 09, 2018 he ended up in the ER lab review shows normal.  It was for dizziness.  Socially his wife Dennie Bible has had liver cyst surgery at [facility] in October 2019 and is doing well.  He is up-to-date with his flu shot but our internal immunization record shows that he has not had pneumonia vaccine.        OV 02/20/2019  Subjective:  Patient ID: Isaac Weber, male , DOB: 12-Jun-1946 , age 49 y.o. , MRN: [id number] , ADDRESS: [address] [place] [facility] [address]   02/20/2019 -   Chief Complaint  Patient presents with   Cough    Having more trouble with phlegm.     HPI Isaac Weber 77 y.o. -presents for chronic cough and cough variant asthma.  He is a former smoker 42 pack.  Last chest x-ray was August 2019.  At that time it was clear.  No previous CT scan of the chest.  He tells me that overall he is doing stable although in the last 6 weeks he has had increasing phlegm production.  It is not acute.  He has been socially isolating.  He has not come into contact with anyone with COVID.  He does go to church but mostly does [employer].  His RSI cough score shows significant deterioration as documented below.  The phlegm is not green but more like white.    OV 06/16/2020  Subjective:  Patient ID: Isaac Weber, male , DOB: 1945/09/30 , age 57 y.o. , MRN: [id number] , ADDRESS: [address] [place] [address] PCP  Althea Charon Netta Neat, DO Patient Care Team: Smitty Cords, DO as PCP - General (Family Medicine) Corky Crafts, MD as PCP - Cardiology (Cardiology)  This Provider for this  visit: Treatment Team:  Attending Provider: Kalman Shan, MD    06/16/2020 -   Chief Complaint  Patient presents with   Follow-up    Pt states he had a flare up with asthma about 1 week ago which turned into pneumonia. Pt states he is now better. Has an occ cough which is better. Denies any complaints of wheezing or increased SOB.   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 -presents for follow-up.  Last seen in July 2020.  That was by me myself.  He presents now for follow-up.  He was supposed to go to [place] but because of Covid pandemic the history of his been postponed to March 2022.  He tells me it was the end of October 2021 he developed an asthma flare.  He is unclear why he had a flareup.  No sick contacts.  After that he played golf in [place] and this made it worse.  On number second 2020 when he had a chest x-ray with Dr. Kirtland Bouchard his primary care physician that showed some infiltrates.  He got antibiotics and prednisone.  This helped him.  Symptoms are improving but now he feels the cough is getting worse again.  He is wondering if it is his chronic cough getting worse.  Overall he continues to be compliant with his Asmanex.       Lab Results  Component Value Date   NITRICOXIDE 39 02/28/2018     OV 01/16/2021  Subjective:  Patient ID: [name], male , DOB: February 12, 1946 , age 25 y.o. , MRN: [id number] , ADDRESS: [address] [place] [facility] [id number] PCP Althea Charon Netta Neat, DO Patient Care Team: Smitty Cords, DO as PCP - General (Family Medicine) Corky Crafts, MD as PCP - Cardiology (Cardiology)  This Provider for this visit: Treatment  Team:  Attending Provider: Kalman Shan, MD    01/16/2021 -   Chief Complaint  Patient presents with   Follow-up    Patient reports that he was on prednisone about 6 months and worse in last 6 months, Yellow sputum at times when coughing.    Chronic cough with cough variant asthma on Asmanex  HPI Isaac Weber 77 y.o. -presents for follow-up.  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  He tells me since the early part of this year has had frequent exacerbations of cough requiring prednisone.  He had 1 course of prednisone in January and another 1 in February and then again in March before he went to [place] and after he came back from [place].  Each of this helped him but the most recent 1 after his retirement did not help him.  He says and also developed shortness of breath particularly while playing golf.  His cough is worse when he lies down particularly on his back or on the left side.  He says cough is so bad it wakes up his wife.  His RSI cough scores are significant deterioration.  He is not sure about wheezing.  Overall he feels deconditioned.  The cough makes him fatigued.  When he was in [place] he actually was feeling better.  He went to [place] around March 2022.  There is no fever.  He had a CT scan of the chest end of last year that showed no evidence of ILD.  Review of his labs indicate slight eosinophilia.  His nitric oxide exhaled test  today was elevated at 40 ppb which is in the gray zone.  He says he is compliant with his Asmanex.  On exam he did have crackles which I thought was present for the first time.  There is no edema orthopnea    Simple office walk 185 feet x  3 laps goal with forehead probe 01/16/2021   O2 used ra  Number laps completed 3  Comments about pace fast  Resting Pulse Ox/HR 98% and 96/min  Final Pulse Ox/HR 98% and 99/min  Desaturated </= 88% no  Desaturated <= 3% points no  Got Tachycardic >/= 90/min yes  Symptoms at end of test Mild ydspnea  Miscellaneous  comments x   Results for ISSA, SUNIGA" (MRN [id number]) as of 01/16/2021 11:16  Ref. Range 03/09/2018 18:36 03/09/2018 18:45 06/02/2020 08:52 10/01/2020 09:26  Eosinophils Absolute Latest Ref Range: 15 - 500 cells/uL 0.4  205    Lab Results  Component Value Date   NITRICOXIDE 40 01/16/2021     IMPRESSION: HRCT Dec 2021 1. No evidence of interstitial lung disease. No acute pulmonary disease. 2. Stable 4 mm left upper lobe solid pulmonary nodule, considered benign. 3. Mild centrilobular and paraseptal emphysema with diffuse bronchial wall thickening and saber sheath trachea, compatible with the provided history of COPD. 4. Two vessel coronary atherosclerosis. 5. Aortic Atherosclerosis (ICD10-I70.0) and Emphysema (ICD10-J43.9).     Electronically Signed   By: Delbert Phenix M.D.   On: 07/24/2020 11:08     ROS - per HPI   OV 03/23/2021  Subjective:  Patient ID: [name], male , DOB: October 07, 1945 , age 13 y.o. 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 -returns for follow-up.  In the year 2022 his cough is dramatically escalated.  It is now chronic.  He had several phone calls and phone messages because of this.  He see nurse practitioner 2 times.  2 visits ago the nurse practitioner started several nasal sprays and Breo but despite all this the cough persists unchanged.  She even added omeprazole but the  cough persist unchanged.  He has upcoming GI appointment locally.  He is frustrated by this.  Wife is here with him.  This is a social embarrassment and also concern in the family.  He has not seen ENT at all.  We recommended [facility] but it is 57 miles away from his home.  He is willing to see Dr. Doran Heater here in [place] who has expertise in chronic cough.  But he also wants to go through his GI appointment.  We discussed trial of asthma biologic empiric in case his refractory cough is because of uncontrolled asthma.  Is because he does cough at night and also brings up colored sputum occasionally.  We discussed the other option of doing gabapentin.  He wants to think about all this at this point.  He is willing to go through  Dr. Algie Coffer.  He is worried about side effects of medications.  We could consider sputum evaluation at next visit.  There are no other new issues.  He did have imaging CT sinus and CT chest other than micronodules and some emphysematous allCLEAR.      OV 05/29/2021  Subjective:  Patient ID: [name], male , DOB: 1945-08-18 , age 47 y.o. , MRN: [id number] , ADDRESS: [address] [place] [facility] [address] PCP Althea Charon Netta Neat, DO Patient Care Team: Smitty Cords, DO as PCP - General (Family Medicine) Corky Crafts, MD as PCP - Cardiology (Cardiology)  This Provider for this visit: Treatment Team:  Attending Provider: Kalman Shan, MD    05/29/2021 -   Chief Complaint  Patient presents with   Follow-up    Productive cough with yellow sputum.    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  CT sinus August 2022 -Negative blood allergy test June 2022 with normal IgE currently on a 14-day prescription clindamycin course through ENT  HPI Isaac Weber 77 y.o. -evaluation of follow-up chronic  cough.  On 05/21/2021 did see ENTLouise Ingegerd Charlotta Nordbladh .  Notes reviewed.  He continues to be bothered by chronic cough.  He did have a fiberoptic exam and showed a low mucopurulence in the nasopharynx he has been started on a 14-day clindamycin course.  He is midway through it.  He has not noticed any difference.  He is wondering after reading article on the Internet but he has chronic tracheitis.  Informed him that laryngoscope he should pick this up and he should talk about this with the ENT if physician assistant.  Review of the records indicate that his vocal cords look normal with normal mobility and this hypopharynx was also normal.  At this point in time he has resolved that he will consider gabapentin if his cough does not improve.  But at this point he also wants to continue with the ENT plan.  I agree with this.  He continues on Trelegy.  I did inform him that this can paradoxically cause cough to become worse.     Lab Results  Component Value Date   NITRICOXIDE 40 01/16/2021     CT Chest data  No results found.  OV 11/20/2021  Subjective:  Patient ID: Isaac Weber, male , DOB: Feb 25, 1946 , age 24 y.o. , MRN: [id number] , ADDRESS: [address] [place] [facility] [address] PCP Althea Charon Netta Neat, DO Patient Care Team: Smitty Cords, DO as PCP - General (Family Medicine) Corky Crafts, MD as PCP - Cardiology (Cardiology)  This Provider for this visit: Treatment Team:  Attending Provider: Kalman Shan, MD    11/20/2021 -   Chief Complaint  Patient presents with   Follow-up    Pt states he has been coughing up a lot of phlegm recently that is yellow in color that is thick in consistency and also has some SOB when he has coughing fits as well as has had problems with wheezing.   Chronic refractory cough  HPI Isaac Weber 77 y.o. -returns for follow-up.  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  He says the clindamycin that he took with  ENT has not helped.  1 month ago ENT put him on gabapentin this was Dr. Mcneil Sober no.  Has been taking gabapentin for a month but this is  also not helped.  He is frustrated by it.  He is not clear as to what exactly makes his cough worse or better.  He does say that when he lies down the cough does get precipitated.  In the middle of the night it does not wake him up but when he wakes up he will have a cough.  He does have some thick yellow stringy sputum.  Small in amount.  RSI cough score is unchanged 22.  Now for the last 1 year or so his cough has been significantly worse than baseline.  It is affecting his quality of life.  He also has back pain.  He believes COVID-vaccine has made his cough worse.  We did a nitric oxide test and is borderline elevated at 36.  We discussed options.  He is agreed for bronchoscopy with lavage. Risks of pneumothorax, hemothorax, sedation/anesthesia complications such as cardiac or respiratory arrest or hypotension, stroke and bleeding all explained. Benefits of diagnosis but limitations of non-diagnosis also explained. Patient verbalized understanding and wished to proceed.   We also discussed about empiric biologic treatment for asthma depending on the bronc results and he is agreeable for this.    He is at his wits end   12/02/2021 Patient has chronic cough. HRCT in August 2022 showed no definitive ILD. Subtle changes at lung bases felt to be non specific but could suggest mild recurrent aspiration. Multiple tiny 2-33mm pulmonary nodules which were felt to reflect mucoid impaction. Mild diffuse bronchial wall thickening with mild centrilobular and paraseptal emphysema. He underwent bronchoscopy with Dr. Marchelle Gearing on 11/26/21. BAL grew out Pseudomonas, streptococcus pneumonia and serratia. He recently had prolonged abx use with ENT, likely need IV antibiotics such as cefepime x 12 days and one more for strep coverage. He previously was noted to have a drug allergy to  ciprofloxacin. He tells me he had previously been on cipro and morphine after a shoulder surgery in 1990s and lost signifcaint amount of weight. Both medications were listed as an allergy. He has since been on cipro without issues, allergy has been removed from his list.   He reports no improvement in cough with Neurontin. Vocal cord examination by ENT in October 2022 was normal. No significant breathing issues. He denies shortness of breath, chest tightness or wheezing. He is fairly inactive d/t back pain. He would like to change inhalers as Asmanex is tier 3 drug and not formulary on this plan. FENO was 36 in April.   01/15/2022 Patient presents today for 6 week follow-up.  BAL grew out three separate microorganisms (Pseudomonas, streptococcus pneumonia and serratia). Referred to infectious disease. Last saw on 12/29/21, started on cipro 750mg .  He is feeling a lot better. Reports improvement in amount of congestion and cough. He gets up phlegm 1-3 times a day. He is sleeping better. He saw no difference with Spiriva Respimat so he stopped taking this. Using hypertonic sodium chloride neb twice a day. No issues when he use nebulizer in the morning but in the evening he notices his ankles swell. He showed me a picture of his ankles which were significantly swollen. He has trace to no leg swelling today in office.   Pulmonary function testing in 2022 showed no evidence of restrictive or obstructive lung disease.  He has no symptoms of shortness of breath or wheezing.  For now okay to stay off daily maintenance inhaler.  We will discontinue Asmanex, budesonide and Spiriva.  Continue hypertonic saline nebulizer as needed for  increased chest congestion.  We will add flutter valve 3 times daily to loosen congestion.  May consider adding therapy vest in the future to help mobilize secretions if patient continues with cough.  Bronchiectasis with acute lower respiratory infection (HCC) - Chronic cough. Patient  underwent bronchoscopy on 11/26/21, BAL grew out three separate microorganisms (Pseudomonas, streptococcus pneumonia and serratia). Following with infectious disease, started on oral ciprofloxacin. He has seen significant improvement in his cough and amount of congestion. Continue hypertonic saline nebulizer as needed for increased chest congestion.  We will add flutter valve 3 times daily to loosen congestion.  May consider adding therapy vest in the future to help mobilize secretions if patient continues with cough.   Centrilobular emphysema (HCC) - Pulmonary function testing in 2022 showed no evidence of restrictive or obstructive lung disease.  He has no symptoms of shortness of breath or wheezing.  For now okay to stay off daily maintenance inhaler.  We will discontinue Asmanex, budesonide and Spiriva.   Lower extremity edema - Patient noticed increased LE edema since starting hypertonic saline nebulizer, mainly in the evening. He had trace swelling on exam today. Checking BNP and BMET. Advised he wear compression stockings.    03/10/2022 Patient presents today for 4-6 week follow-up. He is doing extremely well today. No more cough. Rarely has phlegm. He uses flutter as needed. He is off all inhalers and has not needed to use hypertonic saline nebulizer. He saw ID on 7/6, he was released from infectious disease.   04/26/2022 Presents today for acute office visit due to cough. Hx bronchiectasis and emphysema. He had bronchoscopy in May 2023 that grew out pseudomonas, strep pneumonia and serratia. He was treated with cipro x 7 week with infectious disease. Symptoms improved dramatically. He was released by ID. Cough has returned, he has had a productive cough x 2 months. Sputum is thick yellow. Associated post nasal drip. Not currently using nasal spray. He is not having any shortness of breath. No longer on maintenance inhaler for emphysema. Maintained on flutter valve as needed. He returned nebulizer  machine. PCP put him on zpack and prednisone in late september which he has finished. Lung function was normal in 2022. Needs pre-op clearance for spinal fusion on Oct 05/12/22.   04/28/2022 - Interim hx  Patient presents today for surgical risk assessment. He is planning for spinal fusion on October 05/12/22, surgery will be done in hospital setting and he plans to be admitted for 1-3 days. He was seen earlier this week for cough. We started him on ciprofloxacin to cover for pseudomonas/strep pneumonia. He had CXR on 04/26/22 which was normal. He is doing better today. Started cipro this morning. He started using nasal spray which has significantly helped PND symptoms. Cough has not been as bad today.  Imaging:  03/16/2021 HRCT- Mild diffuse bronchial wall thickening with mild central lobar and paraseptal emphysema.  Multiple tiny 2 to 3 mm pulmonary nodules scattered throughout the periphery of the lungs bilaterally.  May represent areas of mucoid impaction with thin terminal bronchioles.   OV 03/29/2023  Subjective:  Patient ID: Isaac Weber, male , DOB: 08-12-1945 , age 31 y.o. , MRN: [id number] , ADDRESS: [address] [place] [facility] [phone] PCP Althea Charon Netta Neat, DO Patient Care Team: Smitty Cords, DO as PCP - General (Family Medicine) Corky Crafts, MD as PCP - Cardiology (Cardiology) Kalman Shan, MD as Consulting Physician (Pulmonary Disease)  This Provider for this visit: Treatment Team:  Attending Provider: Kalman Shan, MD    03/29/2023 -   Chief Complaint  Patient presents with   Follow-up    F/up on cough, phlegm.     HPI Isaac Weber 77 y.o. -returns for follow-up.  Presents with his wife.  His last CT scan was in 2022 August.  I last saw him in April 2023 after they did bronchoscopy in May 2023.  He had triple organism that grew on 11/26/2021 Pseudomonas up to coccus and Serratia.  He saw Dr. Rivka Safer and infectious diseases.   She had a more prolonged ciprofloxacin.  This got rid of the cough.  He has since followed up with our nurse practitioner.  Review of the records indicate his cough is doing well.  However now he tells me that his cough is back since April 2024 and getting worse.  There is some amount of sputum that fills up "half a shot glass" in a day.  Cough is improved by taking cough medication.  Flutter valve helps release the mucus.  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  There is no fever or chills.  I personally visualized the CT scan from August 2022 and there is no ILD in it.  There is no lung cancer.  But it has been 2 years since he had 1.  OV 06/13/2023  Subjective:  Patient ID: [name], male , DOB: Aug 18, 1945 , age 21 y.o. , MRN: [id number] , ADDRESS: [id number] [place] [facility] [id number] PCP Althea Charon Netta Neat, DO Patient Care Team: Smitty Cords, DO as PCP - General (Family Medicine) Corky Crafts, MD as PCP - Cardiology (Cardiology) Kalman Shan, MD as Consulting Physician (Pulmonary Disease)  This Provider for this visit: Treatment Team:  Attending Provider: Kalman Shan, MD    06/13/2023 -   Chief Complaint  Patient presents with   Follow-up    Bronch f/u      HPI Isaac Weber 77 y.o. -chronic cough returns for follow-up.  After the bronchoscopy is doing well.  Cough is minimal.  He says at this point in time he is try Trelegy Asmanex and Spiriva a for the cough.  Wife is also here she is independent historian affirms the same.  But these have not helped.  Previous gabapentin has not helped.  He was really confirmed about recurrence of colonization.  So we did a bronchoscopy with lavage number 12/13/2022.  The liver shows a lot of neutrophils just like last time but cultures negative so far.  At this point in time I reviewed the results and shared it with him.  He and his wife have  resolved to just tolerate the cough.  After he left I heard from one of my colleagues that one of his patients reported improvement with some over-the-counter cough remedies.  I have asked the CMA to share this with the patient.    Dr Gretta Cool Reflux Symptom Index (> 13-15 suggestive of LPR cough) Results for HERMEN, POELKER (MRN [id number]) as of 05/31/2018 09:46  Ref. Range 03/09/2018 18:36 03/09/2018 18:45  Eosinophils Absolute Latest Ref Range: 0.0 - 0.7 Isaac Weber/uL 0.4    0 -> 5  =  none ->severe problem.td 07/08/2017  08/18/2017  02/28/2018  05/31/2018 asmanex 1 bid 02/20/2019  01/16/2021  03/23/2021  11/20/2021 Broch and then triple organism+ 03/29/2023  06/13/2023   Hoarseness of problem with voice 1 2 2 3 3 1 1  1  0 0  Clearing  Of Throat 3 2 2  0 2 3 2 3 2  0  Excess throat mucus or feeling of post nasal drip 1 0 3 0 3 5 4 5 0 0   Difficulty swallowing food, liquid or tablets 0 0 0 0 0 0 0 0 0 0   Cough after eating or lying down 4 1 5 1 1 5 5 4 4 2   Breathing difficulties or choking episodes 0 0 0 0 0 4 3 4  ` 0  Troublesome or annoying cough 5 2 5 1 3 5 5 5 5 3   Sensation of something sticking in throat or lump in throat 0 0 0 0 0 0 0 0 0 0   Heartburn, chest pain, indigestion, or stomach acid coming up 0 0 0 0 0 0 2 0 0 0   TOTAL 14 7 17 5 12 23 22 22 12 5     PFT     Latest Ref Rng & Units 01/27/2021    2:18 PM  ILD indicators  FVC-Pre L 3.31   FVC-Predicted Pre % 84   FVC-Post L 3.48   FVC-Predicted Post % 89   TLC L 6.62   TLC Predicted % 99   DLCO uncorrected ml/min/mmHg 28.84   DLCO UNC %Pred % 122   DLCO Corrected ml/min/mmHg 29.53   DLCO COR %Pred % 125       LAB RESULTS last 96 hours No results found.  LAB RESULTS last 90 days Recent Results (from the past 2160 hour(s))  Expectorated Sputum Assessment w Gram Stain, Rflx to Resp Cult     Status: None   Collection Time: 03/30/23  7:05 AM   Specimen: Expectorated Sputum  Result Value Ref Range   Specimen Description  EXPECTORATED SPUTUM    Special Requests NONE    Sputum evaluation      Sputum specimen not acceptable for testing.  Please recollect.   Darral Dash, RN 03/30/23 1007 MW Performed at [facility], [address] [place], [place] [address]    Report Status 03/30/2023 FINAL   Acid Fast Smear (AFB)     Status: None   Collection Time: 03/30/23  7:05 AM   Specimen: Sputum  Result Value Ref Range   AFB Specimen Processing Concentration    Acid Fast Smear Negative     Comment: (NOTE) Performed At: [facility] [address] [place], [place] [id number] Jolene Schimke MD RK:[id number]    Source (AFB) EXPECTORATED SPUTUM     Comment: Performed at [facility] [place], [address] [place], [place] [address]  Acid Fast Culture with reflexed sensitivities     Status: None   Collection Time: 03/30/23  7:05 AM   Specimen: Sputum  Result Value Ref Range   Acid Fast Culture Negative     Comment: (NOTE) No acid fast bacilli isolated after 6 weeks. Performed At: [facility] [address] [place], [place] [id number] Jolene Schimke MD GY:[id number]    Source of Sample EXPECTORATED SPUTUM     Comment: Performed at [facility], [address] [place], [place] [address]  Expectorated Sputum Assessment w Gram Stain, Rflx to Resp Cult     Status: None   Collection Time: 04/01/23  8:50 AM   Specimen: Expectorated Sputum  Result Value Ref Range   Specimen Description EXPECTORATED SPUTUM    Special Requests NONE    Sputum evaluation      Sputum specimen not acceptable for testing.  Please recollect.  NOTIFIED [facility] AT 1130 ON 04/01/23 BY GM Performed at [facility], [address] [place], [place] [address]    Report Status 04/01/2023 FINAL   Glucose, capillary     Status: Abnormal   Collection Time: 05/30/23 10:13 AM  Result Value Ref Range   Glucose-Capillary 118 (H) 70 - 99 mg/dL    Comment: Glucose reference range applies  only to samples taken after fasting for at least 8 hours.  Body fluid cell count with differential     Status: Abnormal   Collection Time: 05/30/23 11:26 AM  Result Value Ref Range   Fluid Type-FCT Bronch Lavag    Color, Fluid COLORLESS (A) YELLOW   Appearance, Fluid CLEAR (A) CLEAR   Total Nucleated Cell Count, Fluid 53 0 - 1,000 cu mm   Neutrophil Count, Fluid 85 (H) 0 - 25 %   Lymphs, Fluid 9 %   Monocyte-Macrophage-Serous Fluid 4 (L) 50 - 90 %   Eos, Fluid 2 %    Comment: Performed at [facility], [address], [place], [place] [address]  Culture, Respiratory w Gram Stain     Status: None   Collection Time: 05/30/23 11:26 AM   Specimen: Bronchoalveolar Lavage; Respiratory  Result Value Ref Range   Specimen Description      BRONCHIAL ALVEOLAR LAVAGE Performed at [facility], [address], [place], [place] [address]    Special Requests      NONE Performed at [facility], [address], [place], [place] [address]    Gram Stain      ABUNDANT WBC PRESENT, PREDOMINANTLY PMN NO ORGANISMS SEEN    Culture      NO GROWTH 2 DAYS Performed at [facility], [address], [place], [place] [address]    Report Status 06/02/2023 FINAL   Cytology - Non PAP; RLL     Status: None   Collection Time: 05/30/23 11:26 AM  Result Value Ref Range   CYTOLOGY - NON GYN      CYTOLOGY - NON PAP CASE: [id number] PATIENT: Gailen O'SHEA Non-Gynecological Cytology Report     Clinical History: None provided Specimen Submitted:  A. LUNG, RLL, LAVAGE:   FINAL MICROSCOPIC DIAGNOSIS: - No malignant cells identified  SPECIMEN ADEQUACY: Satisfactory for evaluation  GROSS: Received is/are 10 ccs of cloudy mucoid material. (CM:cm) Prepared: Smears:  2 Concentration Method (Thin Prep):  0 Cell Block:  0 Additional Studies:  n/a     Final Diagnosis performed by Clifton James, MD.   Electronically  signed 05/31/2023 Technical and / or Professional components performed at [facility], [address], [place], [place] [address].  Immunohistochemistry Technical component (if applicable) was performed at [facility]. [address], [place], [place] [address].   IMMUNOHISTOCHEMISTRY DISCLAIMER (if applicable): Some of these immunohistochemical stains may have been developed and the performanc e characteristics determine by [facility]. Some may not have been cleared or approved by the U.S. Food and Drug Administration. The FDA has determined that such clearance or approval is not necessary. This test is used for clinical purposes. It should not be regarded as investigational or for research. This laboratory is certified under the Clinical Laboratory Improvement Amendments of 1988 (CLIA-88) as qualified to perform high complexity clinical laboratory testing.  The controls stained appropriately.   IHC stains are performed on formalin fixed, paraffin embedded tissue using a 3,3"diaminobenzidine (DAB) chromogen and Leica Bond Autostainer  System. The staining intensity of the nucleus is score manually and is reported as the percentage of tumor cell nuclei demonstrating specific nuclear staining. The specimens are fixed in 10% Neutral Formalin for at least 6 hours and up to 72hrs. These tests are validated on decalcified tissue. Results should be interp reted with caution given the possibility of false negative results on decalcified specimens. Antibody Clones are as follows ER-clone [id number], PR-clone 16, Ki67- clone MM1. Some of these immunohistochemical stains may have been developed and the performance characteristics determined by [facility].   Pneumocystis smear by DFA     Status: None   Collection Time: 05/30/23 11:26 AM   Specimen: Bronchial Alveolar Lavage; Respiratory  Result Value Ref Range   Specimen Source-PJSRC  BRONCHIAL ALVEOLAR LAVAGE    Pneumocystis jiroveci Ag See Scanned report in [place] Link     Comment: Performed at [facility], [address] [place], [place] [address]         has a past medical history of Abnormal chest CT, Actinic keratosis, Allergic rhinitis, unspecified, Anemia, Aortic atherosclerosis (HCC), Arthritis, BPH (benign prostatic hyperplasia), COPD (chronic obstructive pulmonary disease) (HCC), Coronary artery disease, Cough variant asthma, Diabetes mellitus without complication (HCC), Glaucoma, Hypertension, Other chronic pain, Pancreatic lesion, Pneumonia, Pure hypercholesterolemia, unspecified, SCC (squamous cell carcinoma) (03/10/2021), SCC (squamous cell carcinoma) (03/10/2021), 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   reports that he quit smoking about 51 years ago. His smoking use included cigarettes. He started smoking about 77 years ago. He has a 78 pack-year smoking history. He has been exposed to tobacco smoke. He has never used smokeless tobacco.  Past Surgical History:  Procedure Laterality Date   APPLICATION OF INTRAOPERATIVE CT SCAN N/A 05/12/2022   Procedure: APPLICATION OF INTRAOPERATIVE CT SCAN;  Surgeon: Venetia Night, MD;  Location: [facility] ORS;  Service: Neurosurgery;  Laterality: N/A;   BACK SURGERY     BRONCHIAL WASHINGS  11/26/2021   Procedure: BRONCHIAL WASHINGS;  Surgeon: Kalman Shan, MD;  Location: [facility] ENDOSCOPY;  Service: Endoscopy;;   BRONCHIAL WASHINGS  05/30/2023   Procedure: BRONCHIAL WASHINGS;  Surgeon: Kalman Shan, MD;  Location: [facility];  Service: Endoscopy;;   BUNIONECTOMY Left 2003   hammer toe as well   BUNIONECTOMY WITH HAMMERTOE RECONSTRUCTION Left 2013   repeat   CERVICAL DISC SURGERY     COLONOSCOPY WITH PROPOFOL N/A 04/30/2021   Procedure: COLONOSCOPY WITH PROPOFOL;  Surgeon: Wyline Mood, MD;  Location: [facility] END[facility]COPY;  Service:  Gastroenterology;  Laterality: N/A;   GREEN LIGHT LASER TURP (TRANSURETHRAL RESECTION OF PROSTATE  2010   laser, shrink prostate   KNEE ARTHROSCOPY Right 1991   LEG SURGERY Left    BENIGN BONE TUMOR   LUMBAR DISC SURGERY  2010   discectomy   LUMBAR DISC SURGERY  2011   LUMBAR LAMINECTOMY/DECOMPRESSION MICRODISCECTOMY Right 05/12/2022   Procedure: RIGHT L5-S1 DISCECTOMY;  Surgeon: Venetia Night, MD;  Location: [facility] ORS;  Service: Neurosurgery;  Laterality: Right;   PROSTATE SURGERY  2002   shrink prostate   ROTATOR CUFF REPAIR Left 1999   debride, remove bonespur   ROTATOR CUFF REPAIR Right 1996   TONSILLECTOMY     TRANSFORAMINAL LUMBAR INTERBODY FUSION W/ MIS 1 LEVEL Right 05/12/2022   Procedure: RIGHT L4-5 MINIMALLY INVASIVE (MIS) TRANSFORAMINAL LUMBAR INTERBODY FUSION (TLIF);  Surgeon: Venetia Night, MD;  Location: [facility] ORS;  Service: Neurosurgery;  Laterality: Right;   VIDEO BRONCHOSCOPY N/A 11/26/2021  Procedure: VIDEO BRONCHOSCOPY WITHOUT FLUORO;  Surgeon: Kalman Shan, MD;  Location: [facility];  Service: Endoscopy;  Laterality: N/A;  for chroni ccough BAL   VIDEO BRONCHOSCOPY N/A 05/30/2023   Procedure: VIDEO BRONCHOSCOPY WITHOUT FLUORO;  Surgeon: Kalman Shan, MD;  Location: [facility];  Service: Endoscopy;  Laterality: N/A;    Allergies  Allergen Reactions   Morphine Diarrhea, Nausea And Vomiting and Other (See Comments)   Morphine And Codeine Diarrhea and Nausea And Vomiting    Immunization History  Administered Date(s) Administered   Fluad Quad(high Dose 65+) 05/13/2019, 06/09/2020, 04/21/2021, 04/22/2022   Fluad Trivalent(High Dose 65+) 04/21/2023   Influenza, High Dose Seasonal PF 07/15/2017, 04/12/2018   PFIZER(Purple Top)SARS-COV-2 Vaccination 02/13/2020, 03/05/2020   Pneumococcal Conjugate-13 01/28/2014   Pneumococcal Polysaccharide-23 03/04/2008, 08/26/2020   Tdap 07/12/2011   Zoster Recombinant(Shingrix) 04/12/2017, 08/17/2017     Family History  Problem Relation Age of Onset   Heart disease Mother 24   Emphysema Father 33   Heart disease Brother 36     Current Outpatient Medications:    albuterol (VENTOLIN HFA) 108 (90 Base) MCG/ACT inhaler, Inhale 2 puffs into the lungs every 6 (six) hours as needed for wheezing or shortness of breath., Disp: 6.7 g, Rfl: 0   amLODipine (NORVASC) 5 MG tablet, Take 1 tablet (5 mg total) by mouth daily., Disp: 90 tablet, Rfl: 1   Blood Glucose Monitoring Suppl (ONE TOUCH ULTRA 2) w/Device KIT, Use to check blood sugar up to 2 times daily, Disp: 1 kit, Rfl: 0   ferrous sulfate 325 (65 FE) MG tablet, Take 325 mg by mouth daily with breakfast., Disp: , Rfl:    Glucosamine-Chondroitin (GLUCOSAMINE CHONDR COMPLEX PO), Take 1 capsule by mouth 2 (two) times daily., Disp: , Rfl:    hydrochlorothiazide (HYDRODIURIL) 12.5 MG tablet, Take 1 tablet (12.5 mg total) by mouth daily., Disp: 90 tablet, Rfl: 1   ketoconazole (NIZORAL) 2 % shampoo, Apply to scalp and let sit for 10 minutes then wash off, use 3 times a week., Disp: 120 mL, Rfl: 10   losartan (COZAAR) 50 MG tablet, Take 1 tablet (50 mg total) by mouth daily., Disp: 90 tablet, Rfl: 3   metFORMIN (GLUCOPHAGE-XR) 500 MG 24 hr tablet, Take 2 tablets (1,000 mg total) by mouth daily with breakfast., Disp: 180 tablet, Rfl: 3   Multiple Minerals-Vitamins (CAL MAG ZINC +D3 PO), Take 1 tablet by mouth every evening., Disp: , Rfl:    Multiple Vitamin (MULTIVITAMIN WITH MINERALS) TABS tablet, Take 1 tablet by mouth daily., Disp: , Rfl:    ONETOUCH ULTRA test strip, Use to check blood sugar up to twice per day, Disp: 200 each, Rfl: 5   rosuvastatin (CRESTOR) 5 MG tablet, Take 1 tablet (5 mg total) by mouth daily., Disp: 90 tablet, Rfl: 3   tamsulosin (FLOMAX) 0.4 MG CAPS capsule, Take 1 capsule (0.4 mg total) by mouth daily., Disp: 90 capsule, Rfl: 3      Objective:   Vitals:   06/13/23 1600  BP: (!) 144/71  Pulse: 74  SpO2: 100%  Weight:  164 lb 9.6 oz (74.7 kg)  Height: 5\' 9"  (1.753 m)    Estimated body mass index is 24.31 kg/m as calculated from the following:   Height as of this encounter: 5\' 9"  (1.753 m).   Weight as of this encounter: 164 lb 9.6 oz (74.7 kg).  @WEIGHTCHANGE @  Filed Weights   06/13/23 1600  Weight: 164 lb 9.6 oz (74.7 kg)  Physical Exam   General: No distress. Looks well O2 at rest: no Cane present: no Sitting in wheel chair: no Frail: no Obese: no Neuro: Alert and Oriented x 3. GCS 15. Speech normal Psych: Pleasant Resp:  Barrel Chest - no.  Wheeze - no, Crackles - no, No overt respiratory distress CVS: Normal heart sounds. Murmurs - no Ext: Stigmata of Connective Tissue Disease - no HEENT: Normal upper airway. PEERL +. No post nasal drip        Assessment:       ICD-10-CM   1. Chronic cough  R05.3     2. Bronchiectasis without complication (HCC)  J47.9          Plan:     Patient Instructions     ICD-10-CM   1. Chronic cough  R05.3     2. Bronchiectasis without complication (HCC)  J47.9       Currently cough is at an acceptable level in terms of quality of life interference.  Bronchoscopy 05/30/2023 without any growth in culture but does show inflammatory cells on a chronic basis.  Multiple different treatment efforts have failed in the past.  Currently basis supportive care   Plan - New ongoing supportive care   Follow-up - 6 months or sooner if needed   FOLLOWUP Return in about 6 months (around 12/11/2023) for 15 min visit, chronic cough, Face to Face Visit.    SIGNATURE    Dr. Kalman Shan, M.D., F.C.C.P,  Pulmonary and Critical Care Medicine Staff Physician, [facility] Center Director - Interstitial Lung Disease  Program  Pulmonary Fibrosis [facility] at [facility] [place], [place], [address]  Pager: [phone], If no answer or between  15:00h - 7:00h: call [phone] Telephone: [phone]  5:14  PM 06/13/2023

## 2023-06-13 NOTE — Telephone Encounter (Signed)
[name] = please tell patient that Dr. Francine Graven was mentioning in passing that herbal remedy  "lung tonic" and Mulein" was reported as having helped some of his patients with cough and other symptoms.  Patient certainly welcome to try this.  I do not know enough about the safety profile but it is available over-the-counter and the instructions should be there.  He should probably buy it on the Internet.

## 2023-06-15 ENCOUNTER — Encounter: Payer: Self-pay | Admitting: Internal Medicine

## 2023-06-20 NOTE — Telephone Encounter (Signed)
Spoke with patient and advised. He will research this and consider it. Nothing further needed at this time.

## 2023-06-21 NOTE — Telephone Encounter (Signed)
Is OTC  . Sending to DR Francine Graven so he can reply back to triage.   Jon  What wast the OTC your patoient felt better with cough?

## 2023-06-27 ENCOUNTER — Other Ambulatory Visit: Payer: PPO

## 2023-06-27 DIAGNOSIS — N401 Enlarged prostate with lower urinary tract symptoms: Secondary | ICD-10-CM | POA: Diagnosis not present

## 2023-06-27 DIAGNOSIS — I10 Essential (primary) hypertension: Secondary | ICD-10-CM

## 2023-06-27 DIAGNOSIS — E785 Hyperlipidemia, unspecified: Secondary | ICD-10-CM | POA: Diagnosis not present

## 2023-06-27 DIAGNOSIS — E1169 Type 2 diabetes mellitus with other specified complication: Secondary | ICD-10-CM

## 2023-06-27 DIAGNOSIS — Z0000 Encounter for general adult medical examination without abnormal findings: Secondary | ICD-10-CM

## 2023-06-27 DIAGNOSIS — R351 Nocturia: Secondary | ICD-10-CM | POA: Diagnosis not present

## 2023-06-28 LAB — COMPLETE METABOLIC PANEL WITH GFR
AG Ratio: 1.8 (calc) (ref 1.0–2.5)
ALT: 17 U/L (ref 9–46)
AST: 18 U/L (ref 10–35)
Albumin: 4.1 g/dL (ref 3.6–5.1)
Alkaline phosphatase (APISO): 84 U/L (ref 35–144)
BUN: 17 mg/dL (ref 7–25)
CO2: 26 mmol/L (ref 20–32)
Calcium: 9.5 mg/dL (ref 8.6–10.3)
Chloride: 101 mmol/L (ref 98–110)
Creat: 0.78 mg/dL (ref 0.70–1.28)
Globulin: 2.3 g/dL (ref 1.9–3.7)
Glucose, Bld: 119 mg/dL — ABNORMAL HIGH (ref 65–99)
Potassium: 4.4 mmol/L (ref 3.5–5.3)
Sodium: 136 mmol/L (ref 135–146)
Total Bilirubin: 0.9 mg/dL (ref 0.2–1.2)
Total Protein: 6.4 g/dL (ref 6.1–8.1)
eGFR: 91 mL/min/{1.73_m2} (ref >=60)

## 2023-06-28 LAB — CBC WITH DIFFERENTIAL/PLATELET
Absolute Lymphocytes: 1501 {cells}/uL (ref 850–3900)
Absolute Monocytes: 500 {cells}/uL (ref 200–950)
Basophils Absolute: 49 {cells}/uL (ref 0–200)
Basophils Relative: 0.8 %
Eosinophils Absolute: 348 {cells}/uL (ref 15–500)
Eosinophils Relative: 5.7 %
HCT: 36.6 % — ABNORMAL LOW (ref 38.5–50.0)
Hemoglobin: 13 g/dL — ABNORMAL LOW (ref 13.2–17.1)
MCH: 32.1 pg (ref 27.0–33.0)
MCHC: 35.5 g/dL (ref 32.0–36.0)
MCV: 90.4 fL (ref 80.0–100.0)
MPV: 9.9 fL (ref 7.5–12.5)
Monocytes Relative: 8.2 %
Neutro Abs: 3703 {cells}/uL (ref 1500–7800)
Neutrophils Relative %: 60.7 %
Platelets: 192 10*3/uL (ref 140–400)
RBC: 4.05 10*6/uL — ABNORMAL LOW (ref 4.20–5.80)
RDW: 12.9 % (ref 11.0–15.0)
Total Lymphocyte: 24.6 %
WBC: 6.1 10*3/uL (ref 3.8–10.8)

## 2023-06-28 LAB — TSH: TSH: 1.87 m[IU]/L (ref 0.40–4.50)

## 2023-06-28 LAB — LIPID PANEL
Cholesterol: 131 mg/dL (ref <200)
HDL: 54 mg/dL (ref >=40)
LDL Cholesterol (Calc): 59 mg/dL
Non-HDL Cholesterol (Calc): 77 mg/dL (ref <130)
Total CHOL/HDL Ratio: 2.4 (calc) (ref <5.0)
Triglycerides: 96 mg/dL (ref <150)

## 2023-06-28 LAB — HEMOGLOBIN A1C
Hgb A1c MFr Bld: 6.5 %{Hb} — ABNORMAL HIGH (ref <5.7)
Mean Plasma Glucose: 140 mg/dL
eAG (mmol/L): 7.7 mmol/L

## 2023-06-28 LAB — PSA: PSA: 3.31 ng/mL (ref <=4.00)

## 2023-07-04 ENCOUNTER — Other Ambulatory Visit (HOSPITAL_COMMUNITY): Payer: Self-pay

## 2023-07-04 ENCOUNTER — Other Ambulatory Visit: Payer: Self-pay

## 2023-07-04 ENCOUNTER — Ambulatory Visit (INDEPENDENT_AMBULATORY_CARE_PROVIDER_SITE_OTHER): Payer: PPO | Admitting: Family Medicine

## 2023-07-04 ENCOUNTER — Encounter: Payer: Self-pay | Admitting: Family Medicine

## 2023-07-04 VITALS — BP 130/62 | Ht 69.0 in | Wt 167.0 lb

## 2023-07-04 DIAGNOSIS — E1169 Type 2 diabetes mellitus with other specified complication: Secondary | ICD-10-CM

## 2023-07-04 DIAGNOSIS — Z23 Encounter for immunization: Secondary | ICD-10-CM | POA: Diagnosis not present

## 2023-07-04 DIAGNOSIS — Z0000 Encounter for general adult medical examination without abnormal findings: Secondary | ICD-10-CM

## 2023-07-04 DIAGNOSIS — I10 Essential (primary) hypertension: Secondary | ICD-10-CM | POA: Diagnosis not present

## 2023-07-04 DIAGNOSIS — N401 Enlarged prostate with lower urinary tract symptoms: Secondary | ICD-10-CM | POA: Diagnosis not present

## 2023-07-04 DIAGNOSIS — R351 Nocturia: Secondary | ICD-10-CM

## 2023-07-04 DIAGNOSIS — E785 Hyperlipidemia, unspecified: Secondary | ICD-10-CM | POA: Diagnosis not present

## 2023-07-04 MED ORDER — ROSUVASTATIN CALCIUM 5 MG PO TABS
5.0000 mg | ORAL_TABLET | Freq: Every day | ORAL | 3 refills | Status: DC
  Filled 2023-07-04 – 2023-08-17 (×2): qty 90, 90d supply, fill #0
  Filled 2023-11-15: qty 90, 90d supply, fill #1
  Filled 2024-02-08: qty 90, 90d supply, fill #2
  Filled 2024-05-09: qty 90, 90d supply, fill #3

## 2023-07-04 MED ORDER — HYDROCHLOROTHIAZIDE 12.5 MG PO TABS
12.5000 mg | ORAL_TABLET | Freq: Every day | ORAL | 3 refills | Status: DC
  Filled 2023-07-04: qty 90, 90d supply, fill #0
  Filled 2023-10-19: qty 90, 90d supply, fill #1
  Filled 2024-01-17: qty 90, 90d supply, fill #2
  Filled 2024-04-13: qty 90, 90d supply, fill #3

## 2023-07-04 MED ORDER — AMLODIPINE BESYLATE 5 MG PO TABS
5.0000 mg | ORAL_TABLET | Freq: Every day | ORAL | 3 refills | Status: DC
  Filled 2023-07-04: qty 90, 90d supply, fill #0
  Filled 2023-10-19: qty 90, 90d supply, fill #1
  Filled 2024-01-17: qty 90, 90d supply, fill #2
  Filled 2024-04-13: qty 90, 90d supply, fill #3

## 2023-07-04 MED ORDER — LOSARTAN POTASSIUM 50 MG PO TABS
50.0000 mg | ORAL_TABLET | Freq: Every day | ORAL | 3 refills | Status: DC
  Filled 2023-07-04 – 2023-08-17 (×2): qty 90, 90d supply, fill #0
  Filled 2023-11-15: qty 90, 90d supply, fill #1
  Filled 2024-02-13: qty 90, 90d supply, fill #2
  Filled 2024-05-09: qty 90, 90d supply, fill #3

## 2023-07-04 MED ORDER — METFORMIN HCL ER 500 MG PO TB24
1000.0000 mg | ORAL_TABLET | Freq: Every day | ORAL | 3 refills | Status: DC
  Filled 2023-07-04: qty 180, 90d supply, fill #0
  Filled 2023-09-27: qty 180, 90d supply, fill #1
  Filled 2023-12-26: qty 180, 90d supply, fill #2
  Filled 2024-03-25: qty 180, 90d supply, fill #3

## 2023-07-04 NOTE — Patient Instructions (Addendum)
Thank you for coming to the office today.  Prevnar-20 vaccine today.  Next year, reconsider Colon screening 2025. Optional  Refilled all medications.  Please schedule a Follow-up Appointment to: Return in about 6 months (around 01/02/2024) for 6 month DM A1c.  If you have any other questions or concerns, please feel free to call the office or send a message through MyChart. You may also schedule an earlier appointment if necessary.  Additionally, you may be receiving a survey about your experience at our office within a few days to 1 week by e-mail or mail. We value your feedback.  Saralyn Pilar, DO [facility], [place]

## 2023-07-04 NOTE — Progress Notes (Signed)
Subjective:    Patient ID: BARTT HA, male    DOB: [birth year], 77 y.o.   MRN: [id number]  ASHLEE MERCER is a 77 y.o. male presenting on 07/04/2023 for Annual Exam   HPI  Discussed the use of AI scribe software for clinical note transcription with the patient, who gave verbal consent to proceed.       CHRONIC DM, Type 2 Hyperlipidemia ASCVD Risk LDL controlled. He remains on Rosuvastatin 5mg  daily for cardiovascular prevention. A1c stable 6.5 Meds: Metformin XR 500mg  x2 = 1000mg  daily at bedtime, tolerating well OneTouch Ultra 2 test supplies  Reports good compliance. Tolerating well w/o side-effects Currently on ARB, Statin Followed by Dr Joseph Art [facility] 09/2021 Denies hypoglycemia, polyuria, visual changes, numbness or tingling.   ASCVD Risk LDL controlled. He remains on Rosuvastatin 5mg  daily for cardiovascular prevention.   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 He gets BP checked every 8 weeks with donating blood. K has been stable on labs Current Meds - Losartan 50mg  and HCTZ 12.5mg  daily, Amlodipine 5mg  daily Reports good compliance, took meds today. Tolerating well, w/o complaints. Denies CP, dyspnea, HA, edema, dizziness / lightheadedness   History of Iron Deficiency Donates blood every 8 weeks. He takes iron supplement.   Osteoarthritis, bilateral knees, lumbar spine Chronic problem, multiple joints, episodic pain and flares He has has seen chiropractor as well. He said he tried Relief Factor, for 7 weeks limited effect. - Uses Voltaren topical PRN with good results - Not effective Tylenol Taking Motrin 200mg  x 4 = 800mg  PRN only on day of golf. 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   Last Colonoscopy 04/30/21 - polyps, repeat 3 years in 2025 if interested.     07/04/2023    1:17 PM 04/22/2023    8:57 AM 02/25/2023    9:46 AM  Depression screen PHQ 2/9  Decreased Interest 0 0 0  Down, Depressed, Hopeless 0 0 0  PHQ - 2 Score 0 0 0  Altered sleeping  0 0  Tired, decreased energy  0 0  Change in appetite  0 0  Feeling bad or failure about yourself   0 0  Trouble concentrating  0 0  Moving slowly or fidgety/restless  0 0  Suicidal thoughts  0 0  PHQ-9 Score  0 0  Difficult doing work/chores  Not difficult at all Not difficult at all       07/04/2023    1:18 PM 02/25/2023    9:46 AM 12/23/2022    9:25 AM 06/24/2022    9:05 AM  GAD 7 : Generalized Anxiety Score  Nervous, Anxious, on Edge 0 0 0 0  Control/stop worrying 0 0 0 0  Worry too much -  different things 0 0 0 0  Trouble relaxing 0 0 0 0  Restless 0 0 0 0  Easily annoyed or irritable 0 0 0 0  Afraid - awful might happen 0 0 0 0  Total GAD 7 Score 0 0 0 0  Anxiety Difficulty  Not difficult at all  Not difficult at all     Past Medical History:  Diagnosis Date   Abnormal chest CT    Actinic keratosis    Allergic rhinitis, unspecified    Anemia    Aortic atherosclerosis (HCC)    Arthritis    BPH (benign prostatic hyperplasia)    COPD (chronic obstructive pulmonary disease) (HCC)    Coronary artery disease    Cough variant asthma    Diabetes mellitus without complication (HCC)    Glaucoma    Hypertension    Other chronic pain    Pancreatic lesion    Pneumonia    Pure  hypercholesterolemia, unspecified    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 WD SCC, ulcerated. [facility] 04/29/2020   Past Surgical History:  Procedure Laterality Date   APPLICATION OF INTRAOPERATIVE CT SCAN N/A 05/12/2022   Procedure: APPLICATION OF INTRAOPERATIVE CT SCAN;  Surgeon: Venetia Night, MD;  Location: [facility] ORS;  Service: Neurosurgery;  Laterality: N/A;   BACK SURGERY     BRONCHIAL WASHINGS  11/26/2021   Procedure: BRONCHIAL WASHINGS;  Surgeon: Kalman Shan, MD;  Location: [facility];  Service: Endoscopy;;   BRONCHIAL WASHINGS  05/30/2023   Procedure: BRONCHIAL WASHINGS;  Surgeon: Kalman Shan, MD;  Location: [facility];  Service: Endoscopy;;   BUNIONECTOMY Left 2003   hammer toe as well   BUNIONECTOMY WITH HAMMERTOE RECONSTRUCTION Left 2013   repeat   CERVICAL DISC SURGERY     COLONOSCOPY WITH PROPOFOL N/A 04/30/2021   Procedure: COLONOSCOPY WITH PROPOFOL;  Surgeon: Wyline Mood, MD;  Location: [facility] END[facility]COPY;  Service: Gastroenterology;  Laterality: N/A;   GREEN LIGHT LASER TURP (TRANSURETHRAL RESECTION OF PROSTATE  2010   laser, shrink prostate   KNEE ARTHROSCOPY Right 1991   LEG SURGERY Left    BENIGN BONE TUMOR   LUMBAR DISC SURGERY  2010   discectomy   LUMBAR DISC SURGERY  2011   LUMBAR LAMINECTOMY/DECOMPRESSION MICRODISCECTOMY Right 05/12/2022   Procedure: RIGHT L5-S1 DISCECTOMY;  Surgeon: Venetia Night, MD;  Location: [facility] ORS;  Service: Neurosurgery;  Laterality: Right;   PROSTATE SURGERY  2002   shrink prostate   ROTATOR CUFF REPAIR Left 1999   debride, remove bonespur   ROTATOR CUFF REPAIR Right 1996   TONSILLECTOMY     TRANSFORAMINAL LUMBAR INTERBODY  FUSION W/ MIS 1 LEVEL Right 05/12/2022   Procedure: RIGHT L4-5 MINIMALLY INVASIVE (MIS) TRANSFORAMINAL LUMBAR INTERBODY FUSION (TLIF);  Surgeon: Venetia Night, MD;  Location: [facility] ORS;  Service: Neurosurgery;  Laterality: Right;   VIDEO BRONCHOSCOPY N/A 11/26/2021   Procedure: VIDEO BRONCHOSCOPY WITHOUT FLUORO;  Surgeon: Kalman Shan, MD;  Location: [facility];  Service: Endoscopy;  Laterality: N/A;  for chroni ccough BAL   VIDEO BRONCHOSCOPY N/A 05/30/2023   Procedure: VIDEO BRONCHOSCOPY WITHOUT FLUORO;  Surgeon: Kalman Shan, MD;  Location: [facility];  Service: Endoscopy;  Laterality: N/A;  Social History   Socioeconomic History   Marital status: Married    Spouse name: Duard Larsen   Number of children: Not on file   Years of education: 12   Highest education level: 12th grade  Occupational History   Occupation: retired  Tobacco Use   Smoking status: Former    Current packs/day: 0.00    Average packs/day: 3.0 packs/day for 26.0 years (78.0 ttl pk-yrs)    Types: Cigarettes    Start date: 09/16/1945    Quit date: 09/17/1971    Years since quitting: 51.8    Passive exposure: Past   Smokeless tobacco: Never  Vaping Use   Vaping status: Never Used  Substance and Sexual Activity   Alcohol use: Yes    Alcohol/week: 2.0 standard drinks of alcohol    Types: 2 Standard drinks or equivalent per week   Drug use: Never   Sexual activity: Yes    Partners: Female  Other Topics Concern   Not on file  Social History Narrative   Not on file   Social Determinants of Health   Financial Resource Strain: Low Risk  (04/22/2023)   Overall Financial Resource Strain (CARDIA)    Difficulty of Paying Living Expenses: Not hard at all  Food Insecurity: No Food Insecurity (04/22/2023)   Hunger Vital Sign    Worried About Running Out of Food in the Last Year: Never true    Ran Out of Food in the Last Year: Never true  Transportation Needs: No Transportation Needs (04/22/2023)    PRAPARE - Administrator, Civil Service (Medical): No    Lack of Transportation (Non-Medical): No  Physical Activity: Sufficiently Active (04/22/2023)   Exercise Vital Sign    Days of Exercise per Week: 3 days    Minutes of Exercise per Session: 60 min  Stress: No Stress Concern Present (04/22/2023)   [employer] of Occupational Health - Occupational Stress Questionnaire    Feeling of Stress : Not at all  Social Connections: Socially Integrated (04/22/2023)   Social Connection and Isolation Panel [NHANES]    Frequency of Communication with Friends and Family: More than three times a week    Frequency of Social Gatherings with Friends and Family: Three times a week    Attends Religious Services: More than 4 times per year    Active Member of Clubs or Organizations: Yes    Attends Banker Meetings: More than 4 times per year    Marital Status: Married  Catering manager Violence: Not At Risk (04/22/2023)   Humiliation, Afraid, Rape, and Kick questionnaire    Fear of Current or Ex-Partner: No    Emotionally Abused: No    Physically Abused: No    Sexually Abused: No   Family History  Problem Relation Age of Onset   Heart disease Mother 82   Emphysema Father 77   Heart disease Brother 88   Current Outpatient Medications on File Prior to Visit  Medication Sig   albuterol (VENTOLIN HFA) 108 (90 Base) MCG/ACT inhaler Inhale 2 puffs into the lungs every 6 (six) hours as needed for wheezing or shortness of breath.   Blood Glucose Monitoring Suppl (ONE TOUCH ULTRA 2) w/Device KIT Use to check blood sugar up to 2 times daily   ferrous sulfate 325 (65 FE) MG tablet Take 325 mg by mouth daily with breakfast.   Glucosamine-Chondroitin (GLUCOSAMINE CHONDR COMPLEX PO) Take 1 capsule by mouth 2 (two) times daily.   ketoconazole (NIZORAL) 2 %  shampoo Apply to scalp and let sit for 10 minutes then wash off, use 3 times a week.   Multiple Minerals-Vitamins (CAL MAG ZINC  +D3 PO) Take 1 tablet by mouth every evening.   Multiple Vitamin (MULTIVITAMIN WITH MINERALS) TABS tablet Take 1 tablet by mouth daily.   ONETOUCH ULTRA test strip Use to check blood sugar up to twice per day   tamsulosin (FLOMAX) 0.4 MG CAPS capsule Take 1 capsule (0.4 mg total) by mouth daily.   No current facility-administered medications on file prior to visit.    Review of Systems  Constitutional:  Negative for activity change, appetite change, chills, diaphoresis, fatigue and fever.  HENT:  Negative for congestion and hearing loss.   Eyes:  Negative for visual disturbance.  Respiratory:  Negative for cough, chest tightness, shortness of breath and wheezing.   Cardiovascular:  Negative for chest pain, palpitations and leg swelling.  Gastrointestinal:  Negative for abdominal pain, constipation, diarrhea, nausea and vomiting.  Genitourinary:  Negative for dysuria, frequency and hematuria.  Musculoskeletal:  Negative for arthralgias and neck pain.  Skin:  Negative for rash.  Neurological:  Negative for dizziness, weakness, light-headedness, numbness and headaches.  Hematological:  Negative for adenopathy.  Psychiatric/Behavioral:  Negative for behavioral problems, dysphoric mood and sleep disturbance.    Per HPI unless specifically indicated above     Objective:    BP 130/62   Ht 5\' 9"  (1.753 m)   Wt 167 lb (75.8 kg)   BMI 24.66 kg/m   Wt Readings from Last 3 Encounters:  07/04/23 167 lb (75.8 kg)  06/13/23 164 lb 9.6 oz (74.7 kg)  05/30/23 160 lb 0.9 oz (72.6 kg)    Physical Exam Vitals and nursing note reviewed.  Constitutional:      General: He is not in acute distress.    Appearance: He is well-developed. He is not diaphoretic.     Comments: Well-appearing, comfortable, cooperative  HENT:     Head: Normocephalic and atraumatic.  Eyes:     General:        Right eye: No discharge.        Left eye: No discharge.     Conjunctiva/sclera: Conjunctivae normal.      Pupils: Pupils are equal, round, and reactive to light.  Neck:     Thyroid: No thyromegaly.  Cardiovascular:     Rate and Rhythm: Normal rate and regular rhythm.     Pulses: Normal pulses.     Heart sounds: Murmur (2/6 mild systolic) heard.  Pulmonary:     Effort: Pulmonary effort is normal. No respiratory distress.     Breath sounds: Normal breath sounds. No wheezing or rales.  Abdominal:     General: Bowel sounds are normal. There is no distension.     Palpations: Abdomen is soft. There is no mass.     Tenderness: There is no abdominal tenderness.  Musculoskeletal:        General: No tenderness. Normal range of motion.     Cervical back: Normal range of motion and neck supple.     Comments: Upper / Lower Extremities: - Normal muscle tone, strength bilateral upper extremities 5/5, lower extremities 5/5  Lymphadenopathy:     Cervical: No cervical adenopathy.  Skin:    General: Skin is warm and dry.     Findings: No erythema or rash.  Neurological:     Mental Status: He is alert and oriented to person, place, and time.     Comments:  Distal sensation intact to light touch all extremities  Psychiatric:        Mood and Affect: Mood normal.        Behavior: Behavior normal.        Thought Content: Thought content normal.     Comments: Well groomed, good eye contact, normal speech and thoughts     Diabetic Foot Exam - Simple   Simple Foot Form Diabetic Foot exam was performed with the following findings: Yes 07/04/2023  1:35 PM  Visual Inspection See comments: Yes Sensation Testing Intact to touch and monofilament testing bilaterally: Yes Pulse Check Posterior Tibialis and Dorsalis pulse intact bilaterally: Yes Comments Mild callus formation       Results for orders placed or performed in visit on 06/27/23  TSH  Result Value Ref Range   TSH 1.87 0.40 - 4.50 mIU/L  PSA  Result Value Ref Range   PSA 3.31 < OR = 4.00 ng/mL  Hemoglobin A1c  Result Value Ref Range   Hgb  A1c MFr Bld 6.5 (H) <5.7 % of total Hgb   Mean Plasma Glucose 140 mg/dL   eAG (mmol/L) 7.7 mmol/L  Lipid panel  Result Value Ref Range   Cholesterol 131 <200 mg/dL   HDL 54 > OR = 40 mg/dL   Triglycerides 96 <454 mg/dL   LDL Cholesterol (Calc) 59 mg/dL (calc)   Total CHOL/HDL Ratio 2.4 <5.0 (calc)   Non-HDL Cholesterol (Calc) 77 <098 mg/dL (calc)  CBC with Differential/Platelet  Result Value Ref Range   WBC 6.1 3.8 - 10.8 Thousand/uL   RBC 4.05 (L) 4.20 - 5.80 Million/uL   Hemoglobin 13.0 (L) 13.2 - 17.1 g/dL   HCT 11.9 (L) 14.7 - 82.9 %   MCV 90.4 80.0 - 100.0 fL   MCH 32.1 27.0 - 33.0 pg   MCHC 35.5 32.0 - 36.0 g/dL   RDW 56.2 13.0 - 86.5 %   Platelets 192 140 - 400 Thousand/uL   MPV 9.9 7.5 - 12.5 fL   Neutro Abs 3,703 1,500 - 7,800 cells/uL   Absolute Lymphocytes 1,501 850 - 3,900 cells/uL   Absolute Monocytes 500 200 - 950 cells/uL   Eosinophils Absolute 348 15 - 500 cells/uL   Basophils Absolute 49 0 - 200 cells/uL   Neutrophils Relative % 60.7 %   Total Lymphocyte 24.6 %   Monocytes Relative 8.2 %   Eosinophils Relative 5.7 %   Basophils Relative 0.8 %  COMPLETE METABOLIC PANEL WITH GFR  Result Value Ref Range   Glucose, Bld 119 (H) 65 - 99 mg/dL   BUN 17 7 - 25 mg/dL   Creat 7.84 6.96 - 2.95 mg/dL   eGFR 91 > OR = 60 MW/UXL/2.44W1   BUN/Creatinine Ratio SEE NOTE: 6 - 22 (calc)   Sodium 136 135 - 146 mmol/L   Potassium 4.4 3.5 - 5.3 mmol/L   Chloride 101 98 - 110 mmol/L   CO2 26 20 - 32 mmol/L   Calcium 9.5 8.6 - 10.3 mg/dL   Total Protein 6.4 6.1 - 8.1 g/dL   Albumin 4.1 3.6 - 5.1 g/dL   Globulin 2.3 1.9 - 3.7 g/dL (calc)   AG Ratio 1.8 1.0 - 2.5 (calc)   Total Bilirubin 0.9 0.2 - 1.2 mg/dL   Alkaline phosphatase (APISO) 84 35 - 144 U/L   AST 18 10 - 35 U/L   ALT 17 9 - 46 U/L      Assessment & Plan:   Problem List Items Addressed  This Visit     BPH associated with nocturia   Essential hypertension   Relevant Medications   amLODipine (NORVASC) 5  MG tablet   hydrochlorothiazide (HYDRODIURIL) 12.5 MG tablet   losartan (COZAAR) 50 MG tablet   rosuvastatin (CRESTOR) 5 MG tablet   Hyperlipidemia associated with type 2 diabetes mellitus (HCC)   Relevant Medications   amLODipine (NORVASC) 5 MG tablet   hydrochlorothiazide (HYDRODIURIL) 12.5 MG tablet   losartan (COZAAR) 50 MG tablet   metFORMIN (GLUCOPHAGE-XR) 500 MG 24 hr tablet   rosuvastatin (CRESTOR) 5 MG tablet   Type 2 diabetes mellitus with other specified complication (HCC)   Relevant Medications   losartan (COZAAR) 50 MG tablet   metFORMIN (GLUCOPHAGE-XR) 500 MG 24 hr tablet   rosuvastatin (CRESTOR) 5 MG tablet   Other Relevant Orders   Urine Microalbumin w/creat. ratio   Other Visit Diagnoses     Annual physical exam    -  Primary   Need for Streptococcus pneumoniae vaccination       Relevant Orders   Pneumococcal conjugate vaccine 20-valent (Completed)   Need for pneumococcal 20-valent conjugate vaccination            Updated Health Maintenance information Reviewed recent lab results with patient Encouraged improvement to lifestyle with diet and exercise Goal of weight loss   Diabetes Mellitus Improved glycemic control with A1c down from 7.5 to 6.5. Currently on Metformin 2 tablets in the evening. -Continue Metformin 2 tablets in the evening. -Collect urine sample today to check kidney function. -Perform foot check today.  Hyperlipidemia Stable on Rosuvastatin. -Continue Rosuvastatin at bedtime.  Iron Deficiency Anemia Stable hemoglobin levels, patient regularly donates blood. -Continue current iron supplementation.  Heart Murmur Mild heart murmur noted on auscultation, patient has a history of an echocardiogram 2 years ago. -No change in management, patient to discuss with cardiologist at next appointment in February.  Colon Cancer Screening Last colonoscopy in 2022 with polyps found, due for repeat screening in 2025. -Discuss options for colon  cancer screening in 2025, considering patient's age and overall health.  General Health Maintenance -Administer Prevnar 20 vaccine today. -Refill all medications for a year. -Schedule follow-up appointment in 6 months.         Orders Placed This Encounter  Procedures   Pneumococcal conjugate vaccine 20-valent   Urine Microalbumin w/creat. ratio    Meds ordered this encounter  Medications   amLODipine (NORVASC) 5 MG tablet    Sig: Take 1 tablet (5 mg total) by mouth daily.    Dispense:  90 tablet    Refill:  3    Mail order   hydrochlorothiazide (HYDRODIURIL) 12.5 MG tablet    Sig: Take 1 tablet (12.5 mg total) by mouth daily.    Dispense:  90 tablet    Refill:  3    Mail order   losartan (COZAAR) 50 MG tablet    Sig: Take 1 tablet (50 mg total) by mouth daily.    Dispense:  90 tablet    Refill:  3    Mail order   metFORMIN (GLUCOPHAGE-XR) 500 MG 24 hr tablet    Sig: Take 2 tablets (1,000 mg total) by mouth daily with supper.    Dispense:  180 tablet    Refill:  3    Mail order   rosuvastatin (CRESTOR) 5 MG tablet    Sig: Take 1 tablet (5 mg total) by mouth at bedtime.    Dispense:  90 tablet  Refill:  3    Mail order     Follow up plan: Return in about 6 months (around 01/02/2024) for 6 month DM A1c.  Saralyn Pilar, DO [facility] 07/04/2023, 1:32 PM

## 2023-07-05 LAB — MICROALBUMIN / CREATININE URINE RATIO
Creatinine, Urine: 82 mg/dL (ref 20–320)
Microalb Creat Ratio: 6 mg/g{creat} (ref <30)
Microalb, Ur: 0.5 mg/dL

## 2023-08-10 IMAGING — CR DG LUMBAR SPINE COMPLETE W/ BEND
1 series · 7 of 7 positions shown · non-contrast
Comparison: 02/10/2009

CLINICAL DATA: Chronic low back pain

EXAM:
LUMBAR SPINE - COMPLETE WITH BENDING VIEWS

[Series 1: dg lumbar spine complete w/bend 6+v · 0.14mm/px · 7 of 7 slices shown]
[im 1/7]
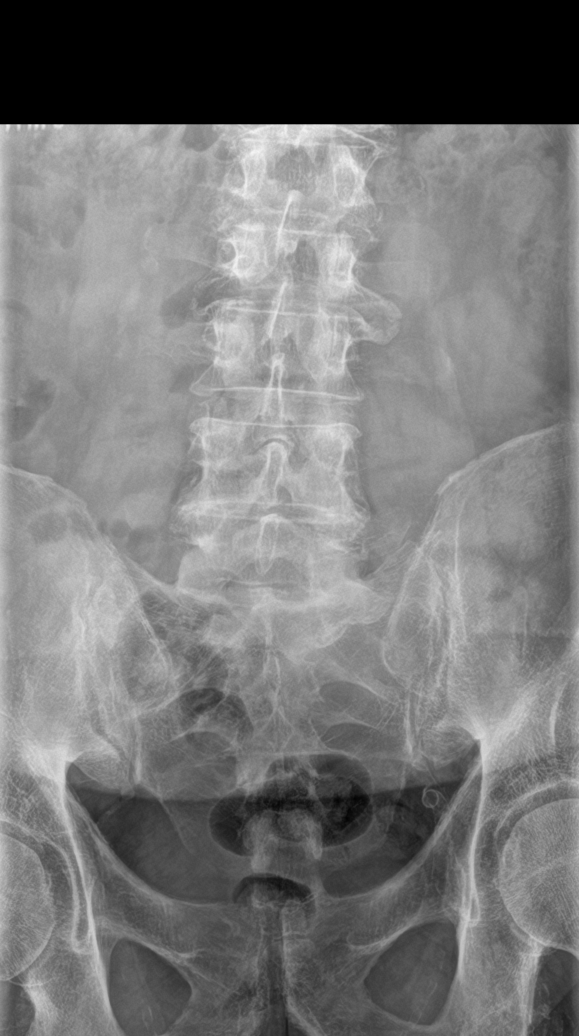
[im 2/7]
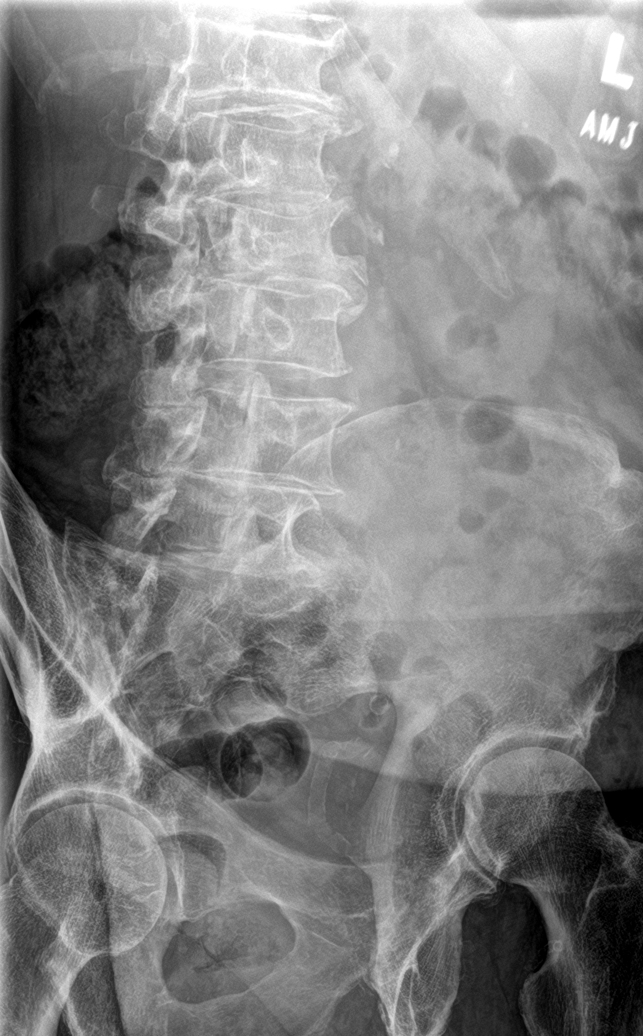
[im 3/7]
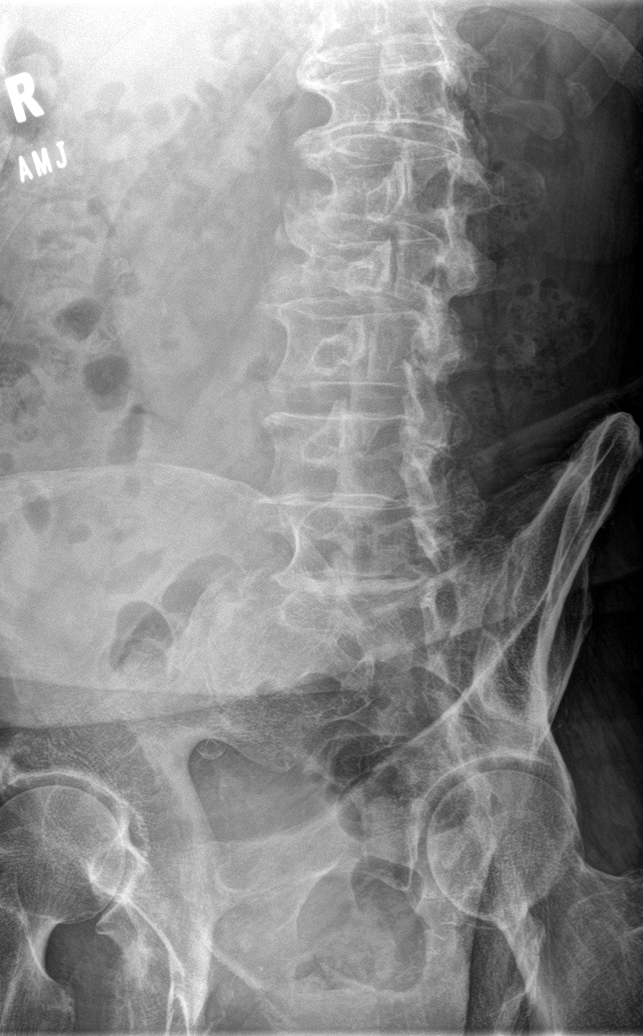
[im 4/7]
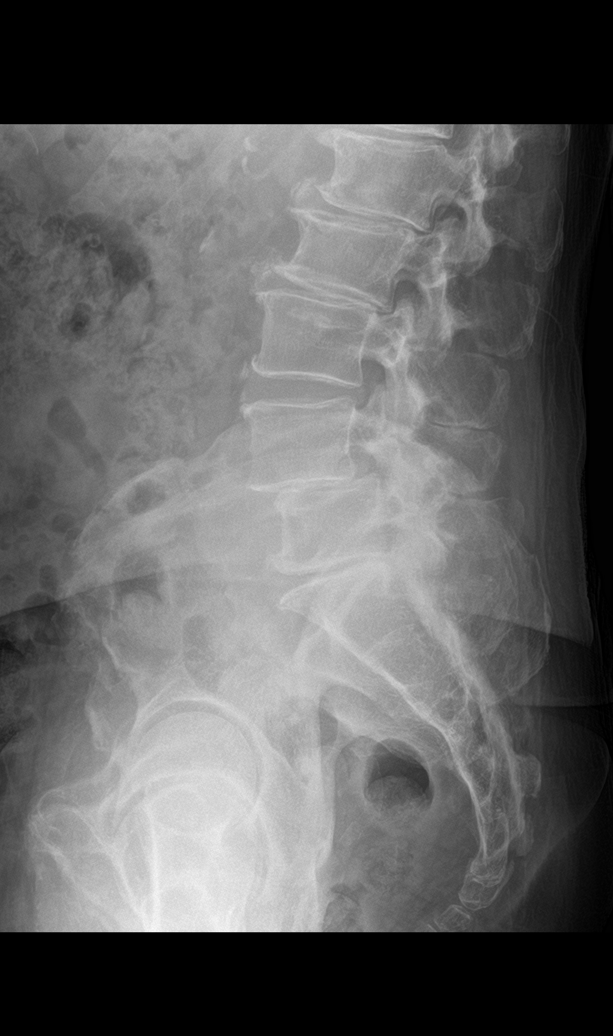
[im 5/7]
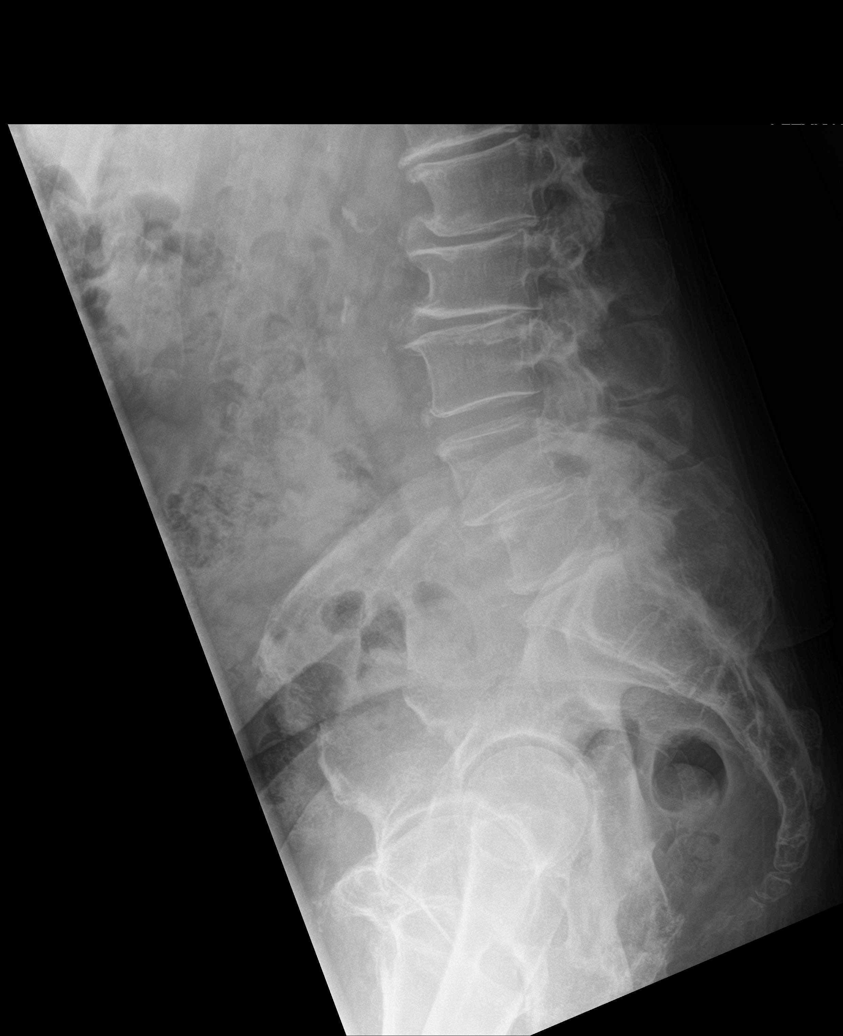
[im 6/7]
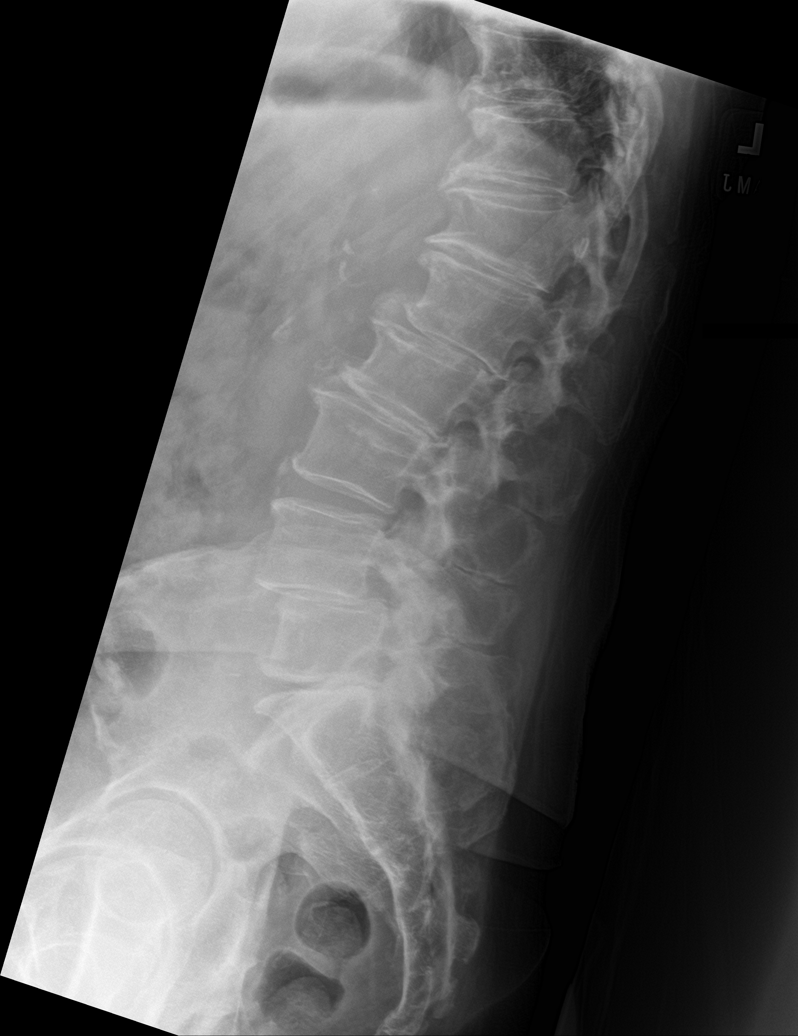
[im 7/7]
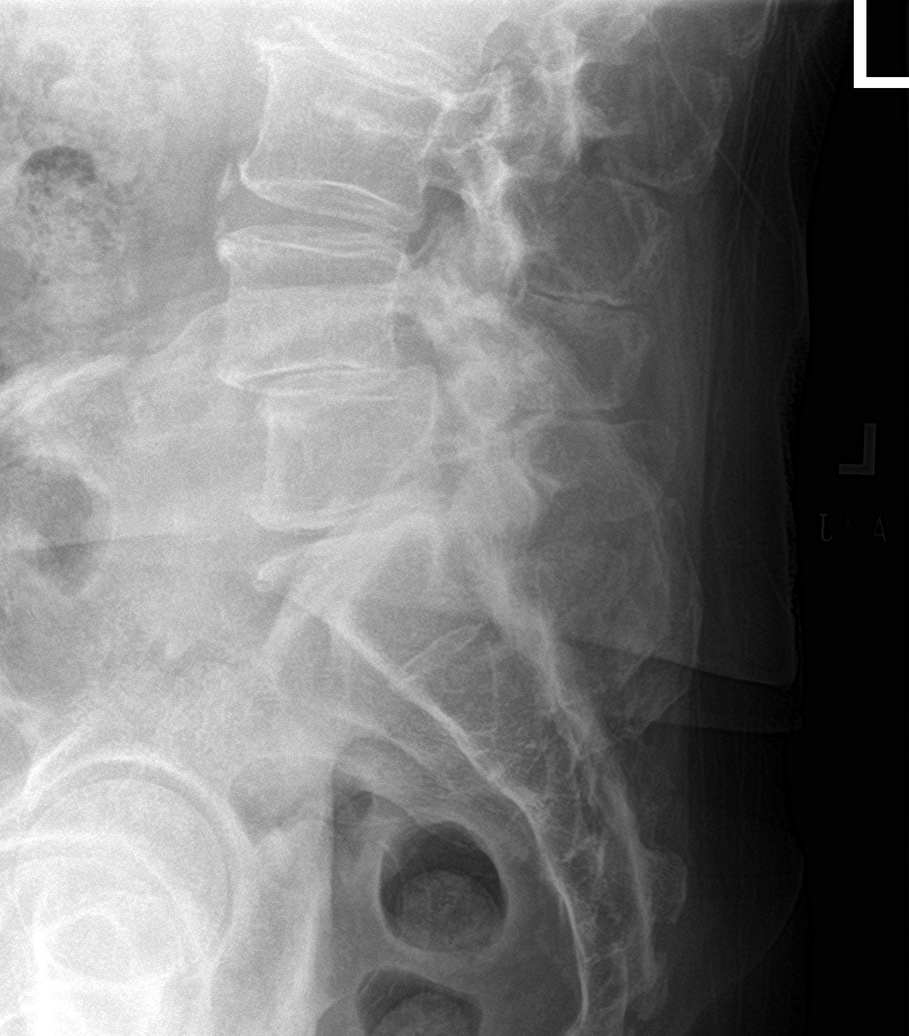

[7 of 7 positions shown; findings below may reference images not displayed]

FINDINGS: Frontal, bilateral oblique, lateral neutral, lateral flexion,
lateral extension views of the lumbar spine are obtained. There are
5 non-rib-bearing lumbar type vertebral bodies. There is grade 1
anterolisthesis of L4 on L5. Mild retrolisthesis of L1 on L2 and L2
on L3 also noted. These changes are likely due to multilevel
spondylosis and facet hypertrophy. Disc space narrowing is seen
throughout the lumbar spine, with relative sparing of the L3-4
level. There is diffuse facet hypertrophy, greatest at L4-5 and
L5-S1. No instability with flexion or extension. No acute fractures.
Sacroiliac joints are unremarkable.
IMPRESSION: 1. Extensive multilevel lumbar spondylosis and facet hypertrophy,
greatest at L4-5 and L5-S1.
2. Retrolisthesis of L1 on L2 and L2 on L3, with grade 1
anterolisthesis of L4 on L5. No pars defects.
3. No instability with flexion or extension.

## 2023-08-17 ENCOUNTER — Other Ambulatory Visit: Payer: Self-pay

## 2023-08-17 ENCOUNTER — Other Ambulatory Visit (HOSPITAL_COMMUNITY): Payer: Self-pay

## 2023-08-19 IMAGING — MR MR LUMBAR SPINE W/O CM
4 of 5 series · 24 of 48 positions shown · non-contrast
Comparison: MR lumbar 01/18/2009; X-ray lumbar 10/27/2021.

CLINICAL DATA: Low back pain, symptoms persist with > 6 wks
treatment Lumbar radiculopathy. Lower back pain radiates down right
leg for 8 weeks. History of lumbar spine surgery.

EXAM:
MRI LUMBAR SPINE WITHOUT CONTRAST
TECHNIQUE: Multiplanar, multisequence MR imaging of the lumbar spine was
performed. No intravenous contrast was administered.

[Series 2: T2 · sagittal · 4.0mm · 0.81mm/px · 6 of 17 slices shown (1 of 2)]
[im 1/17]
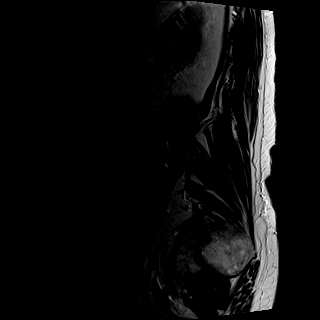
[im 4/17]
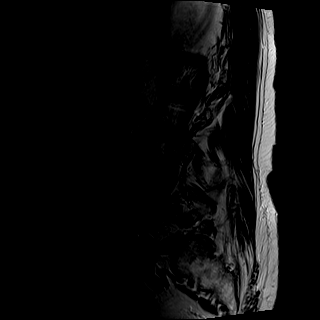
[im 7/17]
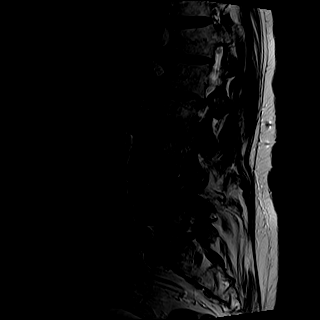
[im 10/17]
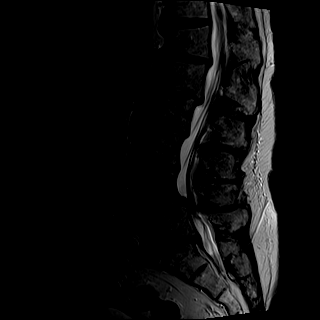
[im 13/17]
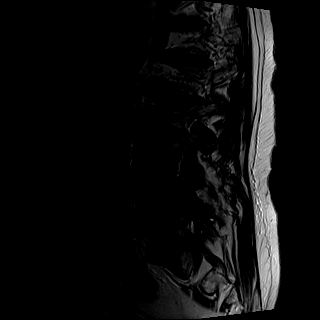
[im 17/17]
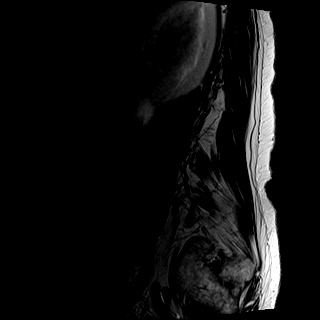

[Series 3: T1 · sagittal · 4.0mm · 0.41mm/px · 6 of 17 slices shown (1 of 2)]
[im 1/17]
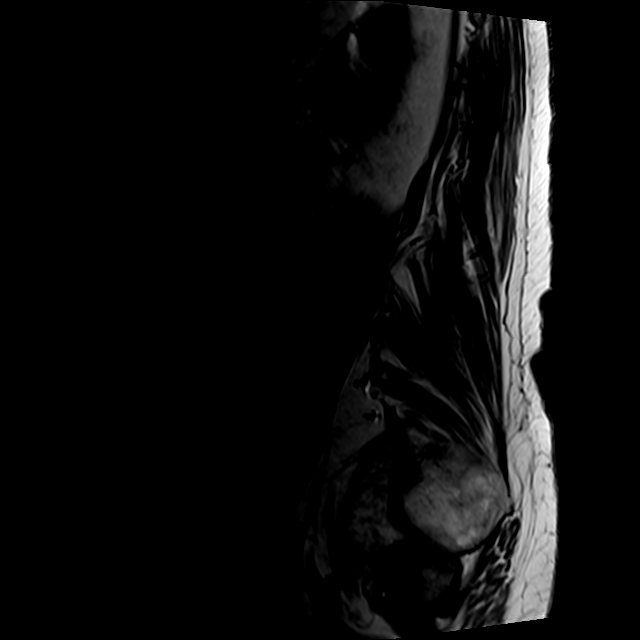
[im 4/17]
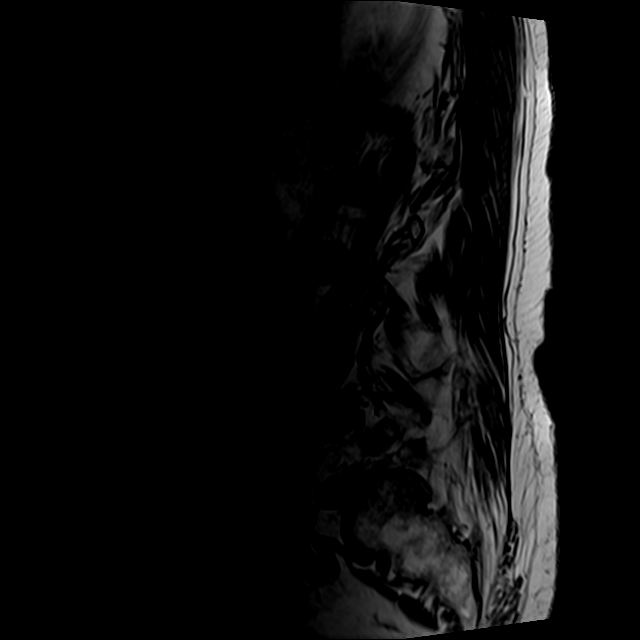
[im 7/17]
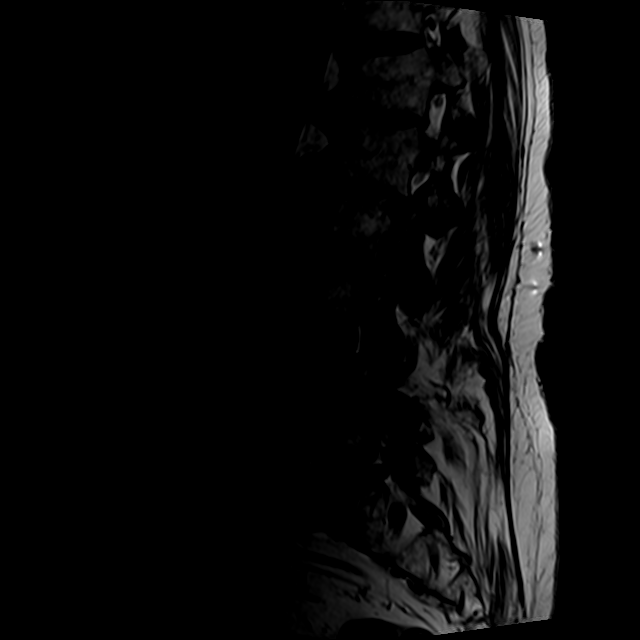
[im 10/17]
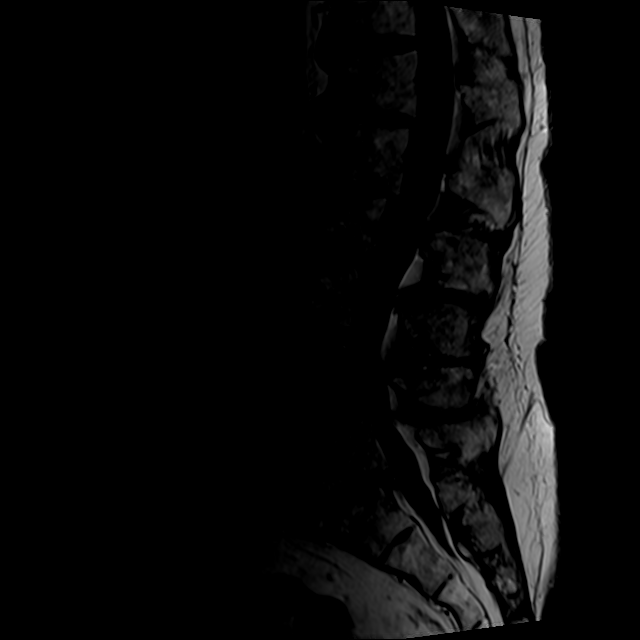
[im 13/17]
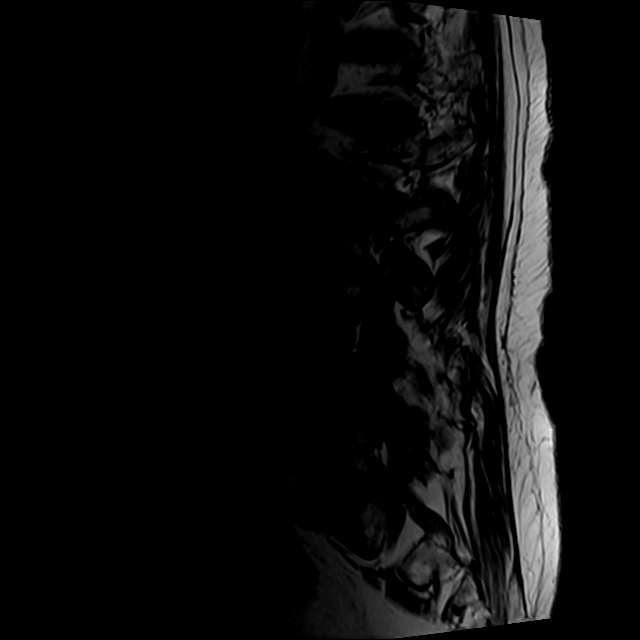
[im 17/17]
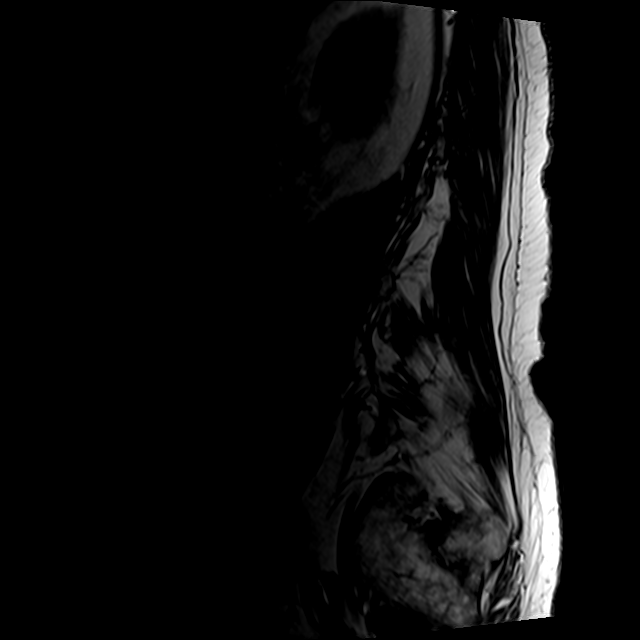

[Series 5: T2 · axial · 4.0mm · 0.78mm/px · z∈[-53,+164]mm · 9 of 39 slices shown (2 of 2)]
[im 1/39]
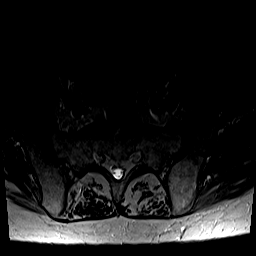
[im 6/39]
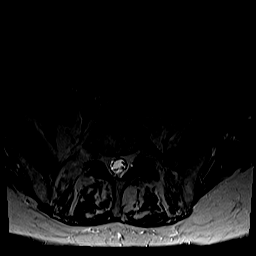
[im 11/39]
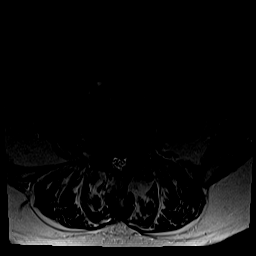
[im 17/39]
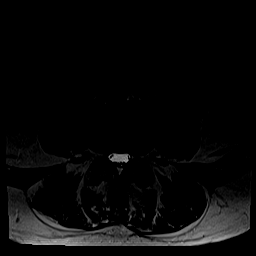
[im 20/39]
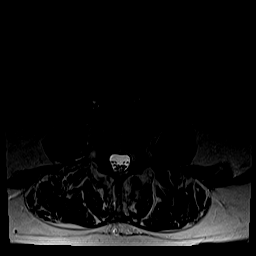
[im 22/39]
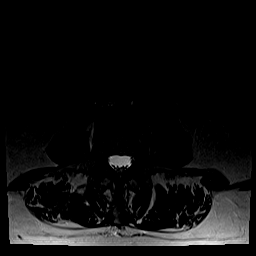
[im 28/39]
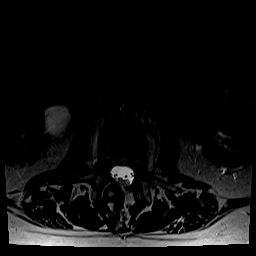
[im 33/39]
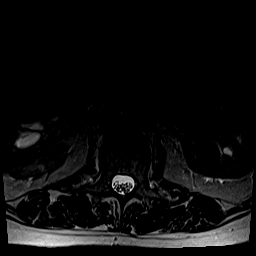
[im 39/39]
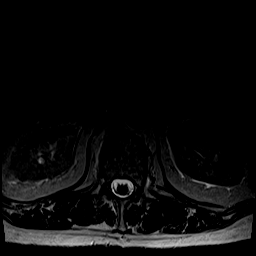

[Series 6: T1 · axial · 4.0mm · 0.39mm/px · z∈[-28,+134]mm · 3 of 39 slices shown (2 of 2)]
[im 6/39]
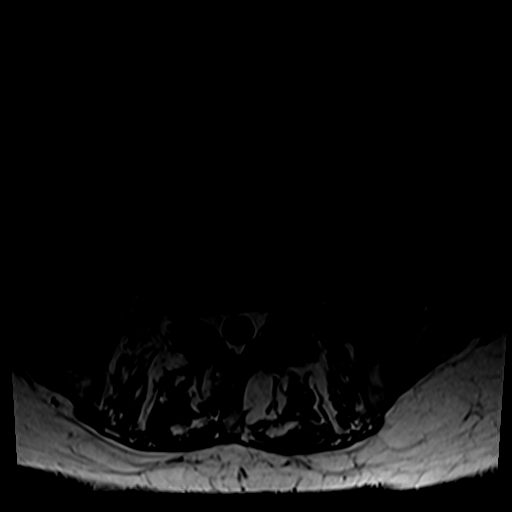
[im 20/39]
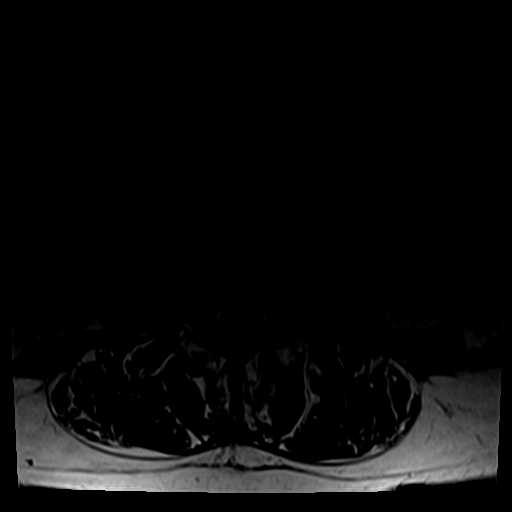
[im 33/39]
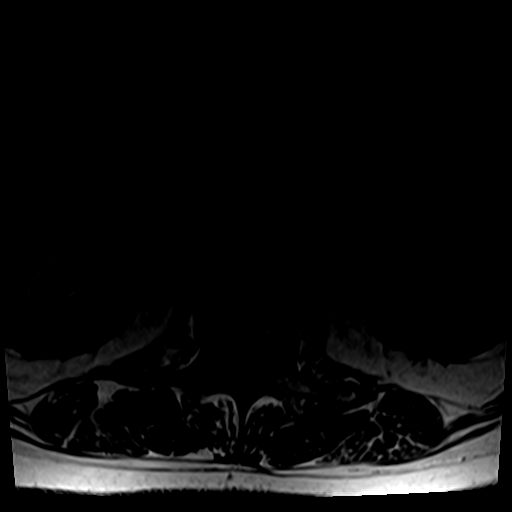

[24 of 48 positions shown; findings below may reference images not displayed]

FINDINGS: Segmentation: Standard; the lowest formed disc space is designated
L5-S1.

Alignment: There is mild levocurvature centered at L2. There is
grade 1 retrolisthesis of L1 on L2 and L2 on L3, grade 1
anterolisthesis of L4 on L5, and grade 1 retrolisthesis of L5 on S1,
overall not significantly changed since 7959.

Vertebrae: Vertebral body heights are preserved. Background marrow
signal is normal. There is no suspicious signal abnormality or
marrow edema.

Conus medullaris and cauda equina: Conus extends to the L1 level.
Conus and cauda equina appear normal.

Paraspinal and other soft tissues: T2 hyperintense lesions in the
kidneys likely reflects cysts, for which no specific imaging
follow-up is required. The paraspinal soft tissues are unremarkable.

Disc levels:

There is multilevel disc desiccation and narrowing throughout the
lumbar spine, most advanced at L4-L5 and L5-S1 with relative sparing
of L3-L4, overall progressed since 7959.

T12-L1: There is a mild disc bulge and mild facet arthropathy
without significant spinal canal or neural foraminal stenosis, not
significantly changed

L1-L2: Postsurgical changes reflecting left laminotomy are noted.
There is a mild disc bulge and bilateral facet arthropathy resulting
in narrowing of the right subarticular zone with potential
irritation of the traversing right L2 nerve root and mild bilateral
neural foraminal stenosis. The previously seen left-sided extrusion
in 7959 is no longer present.

L2-L3: There is a mild disc bulge, degenerative endplate change, and
bilateral facet arthropathy resulting in severe left and moderate
right neural foraminal stenosis without significant spinal canal
stenosis, worsened since 7959.

L3-L4: There is a minimal disc bulge and bilateral facet arthropathy
resulting in mild left worse than right neural foraminal stenosis
without significant spinal canal stenosis

L4-L5: There is grade 1 anterolisthesis with uncovering of the disc
posteriorly and a mild superimposed bulge, and advanced bilateral
facet arthropathy resulting in severe spinal canal stenosis with
impingement of the cauda equina nerve roots and moderate to severe
left and severe right neural foraminal stenosis, progressed since
7959.

L5-S1: There is a disc bulge and bilateral facet arthropathy
resulting in narrowing of the right subarticular zone with possible
impingement of the traversing right S1 nerve root and mild bilateral
neural foraminal stenosis.
IMPRESSION: 1. Grade 1 anterolisthesis of L4 on L5 with associated advanced
facet arthropathy resulting in severe spinal canal stenosis with
impingement of the cauda equina nerve roots and moderate to severe
left and severe right neural foraminal stenosis, progressed since
[DATE]. Severe left and moderate right neural foraminal stenosis at
L2-L3, progressed since [DATE]. Disc bulge at L5-S1 with possible impingement of the traversing
right S1 nerve root. There is also narrowing of the right
subarticular zone at L1-L2 with potential irritation of the
traversing L2 nerve root.
4. Grade 1 retrolisthesis of L1 on L2, L2 on L3, and L5 on S1,
similar to 7959. There is mild levocurvature centered at L2.

## 2023-09-05 ENCOUNTER — Encounter: Payer: Self-pay | Admitting: Cardiology

## 2023-09-05 ENCOUNTER — Ambulatory Visit: Payer: PPO | Attending: Cardiology | Admitting: Cardiology

## 2023-09-05 VITALS — BP 120/68 | HR 83 | Ht 69.0 in | Wt 166.6 lb

## 2023-09-05 DIAGNOSIS — I10 Essential (primary) hypertension: Secondary | ICD-10-CM | POA: Diagnosis not present

## 2023-09-05 DIAGNOSIS — I2510 Atherosclerotic heart disease of native coronary artery without angina pectoris: Secondary | ICD-10-CM | POA: Diagnosis not present

## 2023-09-05 NOTE — Patient Instructions (Signed)
 Medication Instructions:   START Aspirin - Take one tablet ( 81mg ) by mouth daily.   *If you need a refill on your cardiac medications before your next appointment, please call your pharmacy*   Lab Work:  None Ordered  If you have labs (blood work) drawn today and your tests are completely normal, you will receive your results only by: MyChart Message (if you have MyChart) OR A paper copy in the mail If you have any lab test that is abnormal or we need to change your treatment, we will call you to review the results.   Testing/Procedures:  None Ordered   Follow-Up: At [facility], you and your health needs are our priority.  As part of our continuing mission to provide you with exceptional heart care, we have created designated Provider Care Teams.  These Care Teams include your primary Cardiologist (physician) and Advanced Practice Providers (APPs -  Physician Assistants and Nurse Practitioners) who all work together to provide you with the care you need, when you need it.  We recommend signing up for the patient portal called "MyChart".  Sign up information is provided on this After Visit Summary.  MyChart is used to connect with patients for Virtual Visits (Telemedicine).  Patients are able to view lab/test results, encounter notes, upcoming appointments, etc.  Non-urgent messages can be sent to your provider as well.   To learn more about what you can do with MyChart, go to [email or web address].    Your next appointment:   12 month(s)  Provider:   You may see Constancia Delton, MD or one of the following Advanced Practice Providers on your designated Care Team:   Laneta Pintos, NP Varney Gentleman, PA-C Cadence Gennaro Khat, PA-C Ronald Cockayne, NP Morey Ar, NP

## 2023-09-05 NOTE — Progress Notes (Signed)
 Cardiology Office Note:    Date:  09/05/2023   ID:  [name], [name] 01-28-1946, MRN [id number]  PCP:  Raina Bunting, DO   [facility] Providers Cardiologist:  Constancia Delton, MD     Referring MD: Domingo Friend *   Chief Complaint  Patient presents with   Follow-up    Cardiac care previously followed by Avery Bodo, MD.  Patient denies new or acute cardiac problems/concerns today.      History of Present Illness:    Isaac Weber is a 78 y.o. male with a hx of CAD (LAD, RCA, LCx, calcification on chest CT), hypertension, former smoker x 25+ years, COPD who presents for follow-up.  Last seen at our [facility].  Known history of coronary calcifications.  Cholesterol is adequately managed with Crestor .  Denies any adverse effects.  Compliant with BP medications.  Denies chest pain or shortness of breath.  Echocardiogram 01/2021 EF 60 to 65%. Chest CT, lung cancer screening 03/2023 LAD, RCA, LCx calcifications.  Past Medical History:  Diagnosis Date   Abnormal chest CT    Actinic keratosis    Allergic rhinitis, unspecified    Anemia    Aortic atherosclerosis (HCC)    Arthritis    BPH (benign prostatic hyperplasia)    COPD (chronic obstructive pulmonary disease) (HCC)    Coronary artery disease    Cough variant asthma    Diabetes mellitus without complication (HCC)    Glaucoma    Hypertension    Other chronic pain    Pancreatic lesion    Pneumonia    Pure hypercholesterolemia, unspecified    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 WD SCC, ulcerated. [facility] 04/29/2020    Past Surgical History:  Procedure Laterality Date   APPLICATION  OF INTRAOPERATIVE CT SCAN N/A 05/12/2022   Procedure: APPLICATION OF INTRAOPERATIVE CT SCAN;  Surgeon: Jodeen Munch, MD;  Location: [facility] ORS;  Service: Neurosurgery;  Laterality: N/A;   BACK SURGERY     BRONCHIAL WASHINGS  11/26/2021   Procedure: BRONCHIAL WASHINGS;  Surgeon: Maire Scot, MD;  Location: [facility] ENDOSCOPY;  Service: Endoscopy;;   BRONCHIAL WASHINGS  05/30/2023   Procedure: BRONCHIAL WASHINGS;  Surgeon: Maire Scot, MD;  Location: [facility]OS[facility];  Service: Endoscopy;;   BUNIONECTOMY Left 2003   hammer toe as well   BUNIONECTOMY WITH HAMMERTOE RECONSTRUCTION Left 2013   repeat   CERVICAL DISC SURGERY     COLONOSCOPY WITH PROPOFOL  N/A 04/30/2021   Procedure: COLONOSCOPY WITH PROPOFOL ;  Surgeon: Luke Salaam, MD;  Location: [facility] END[facility]COPY;  Service: Gastroenterology;  Laterality: N/A;   GREEN LIGHT LASER TURP (TRANSURETHRAL RESECTION OF PROSTATE  2010   laser, shrink prostate   KNEE ARTHROSCOPY Right 1991   LEG SURGERY Left    BENIGN BONE TUMOR   LUMBAR DISC SURGERY  2010   discectomy   LUMBAR DISC SURGERY  2011   LUMBAR LAMINECTOMY/DECOMPRESSION MICRODISCECTOMY Right 05/12/2022   Procedure: RIGHT L5-S1 DISCECTOMY;  Surgeon: Jodeen Munch, MD;  Location: [facility] ORS;  Service: Neurosurgery;  Laterality: Right;   PROSTATE SURGERY  2002   shrink prostate   ROTATOR CUFF REPAIR Left 1999   debride, remove bonespur  ROTATOR CUFF REPAIR Right 1996   TONSILLECTOMY     TRANSFORAMINAL LUMBAR INTERBODY FUSION W/ MIS 1 LEVEL Right 05/12/2022   Procedure: RIGHT L4-5 MINIMALLY INVASIVE (MIS) TRANSFORAMINAL LUMBAR INTERBODY FUSION (TLIF);  Surgeon: Jodeen Munch, MD;  Location: [facility] ORS;  Service: Neurosurgery;  Laterality: Right;   VIDEO BRONCHOSCOPY N/A 11/26/2021   Procedure: VIDEO BRONCHOSCOPY WITHOUT FLUORO;  Surgeon: Maire Scot, MD;  Location: [facility];  Service: Endoscopy;  Laterality: N/A;  for chroni ccough BAL   VIDEO BRONCHOSCOPY N/A 05/30/2023    Procedure: VIDEO BRONCHOSCOPY WITHOUT FLUORO;  Surgeon: Maire Scot, MD;  Location: [facility];  Service: Endoscopy;  Laterality: N/A;    Current Medications: Current Meds  Medication Sig   albuterol  (VENTOLIN  HFA) 108 (90 Base) MCG/ACT inhaler Inhale 2 puffs into the lungs every 6 (six) hours as needed for wheezing or shortness of breath.   amLODipine  (NORVASC ) 5 MG tablet Take 1 tablet (5 mg total) by mouth daily.   Blood Glucose Monitoring Suppl (ONE TOUCH ULTRA 2) w/Device KIT Use to check blood sugar up to 2 times daily   ferrous sulfate 325 (65 FE) MG tablet Take 325 mg by mouth daily with breakfast.   Glucosamine-Chondroitin (GLUCOSAMINE CHONDR COMPLEX PO) Take 1 capsule by mouth 2 (two) times daily.   hydrochlorothiazide  (HYDRODIURIL ) 12.5 MG tablet Take 1 tablet (12.5 mg total) by mouth daily.   ketoconazole  (NIZORAL ) 2 % shampoo Apply to scalp and let sit for 10 minutes then wash off, use 3 times a week.   losartan  (COZAAR ) 50 MG tablet Take 1 tablet (50 mg total) by mouth daily.   metFORMIN  (GLUCOPHAGE -XR) 500 MG 24 hr tablet Take 2 tablets (1,000 mg total) by mouth daily with supper.   Multiple Minerals-Vitamins (CAL MAG ZINC +D3 PO) Take 1 tablet by mouth every evening.   Multiple Vitamin (MULTIVITAMIN WITH MINERALS) TABS tablet Take 1 tablet by mouth daily.   ONETOUCH ULTRA test strip Use to check blood sugar up to twice per day   rosuvastatin  (CRESTOR ) 5 MG tablet Take 1 tablet (5 mg total) by mouth at bedtime.   tamsulosin  (FLOMAX ) 0.4 MG CAPS capsule Take 1 capsule (0.4 mg total) by mouth daily.     Allergies:   Morphine and Morphine and codeine    Social History   Socioeconomic History   Marital status: Married    Spouse name: Decameron Hood   Number of children: Not on file   Years of education: 12   Highest education level: 12th grade  Occupational History   Occupation: retired  Tobacco Use   Smoking status: Former    Current packs/day: 0.00    Average  packs/day: 3.0 packs/day for 26.0 years (78.0 ttl pk-yrs)    Types: Cigarettes    Start date: 09/16/1946    Quit date: 09/17/1971    Years since quitting: 52.0    Passive exposure: Past   Smokeless tobacco: Never  Vaping Use   Vaping status: Never Used  Substance and Sexual Activity   Alcohol use: Yes    Alcohol/week: 2.0 standard drinks of alcohol    Types: 2 Standard drinks or equivalent per week   Drug use: Never   Sexual activity: Yes    Partners: Female  Other Topics Concern   Not on file  Social History Narrative   Not on file   Social Drivers of Health   Financial Resource Strain: Low Risk  (04/22/2023)   Overall Financial Resource Strain (CARDIA)    Difficulty of  Paying Living Expenses: Not hard at all  Food Insecurity: No Food Insecurity (04/22/2023)   Hunger Vital Sign    Worried About Running Out of Food in the Last Year: Never true    Ran Out of Food in the Last Year: Never true  Transportation Needs: No Transportation Needs (04/22/2023)   PRAPARE - Administrator, Civil Service (Medical): No    Lack of Transportation (Non-Medical): No  Physical Activity: Sufficiently Active (04/22/2023)   Exercise Vital Sign    Days of Exercise per Week: 3 days    Minutes of Exercise per Session: 60 min  Stress: No Stress Concern Present (04/22/2023)   [employer] of Occupational Health - Occupational Stress Questionnaire    Feeling of Stress : Not at all  Social Connections: Socially Integrated (04/22/2023)   Social Connection and Isolation Panel [NHANES]    Frequency of Communication with Friends and Family: More than three times a week    Frequency of Social Gatherings with Friends and Family: Three times a week    Attends Religious Services: More than 4 times per year    Active Member of Clubs or Organizations: Yes    Attends Engineer, structural: More than 4 times per year    Marital Status: Married     Family History: The patient's family  history includes Emphysema (age of onset: 76) in his father; Heart disease (age of onset: 4) in his mother; Heart disease (age of onset: 62) in his brother.  ROS:   Please see the history of present illness.     All other systems reviewed and are negative.  EKGs/Labs/Other Studies Reviewed:    The following studies were reviewed today:  EKG Interpretation Date/Time:  Monday September 05 2023 14:21:14 EST Ventricular Rate:  83 PR Interval:  168 QRS Duration:  114 QT Interval:  370 QTC Calculation: 434 R Axis:   121  Text Interpretation: Normal sinus rhythm Right bundle branch block Left posterior fascicular block Confirmed by Constancia Delton ([id number]) on 09/05/2023 2:36:55 PM    Recent Labs: 06/27/2023: ALT 17; BUN 17; Creat 0.78; Hemoglobin 13.0; Platelets 192; Potassium 4.4; Sodium 136; TSH 1.87  Recent Lipid Panel    Component Value Date/Time   CHOL 131 06/27/2023 1015   TRIG 96 06/27/2023 1015   HDL 54 06/27/2023 1015   CHOLHDL 2.4 06/27/2023 1015   LDLCALC 59 06/27/2023 1015     Risk Assessment/Calculations:             Physical Exam:    VS:  BP 120/68 (BP Location: Left Arm, Patient Position: Sitting, Cuff Size: Normal)   Pulse 83   Ht 5\' 9"  (1.753 m)   Wt 166 lb 9.6 oz (75.6 kg)   SpO2 99%   BMI 24.60 kg/m     Wt Readings from Last 3 Encounters:  09/05/23 166 lb 9.6 oz (75.6 kg)  07/04/23 167 lb (75.8 kg)  06/13/23 164 lb 9.6 oz (74.7 kg)     GEN:  Well nourished, well developed in no acute distress HEENT: Normal NECK: No JVD; No carotid bruits CARDIAC: RRR, no murmurs, rubs, gallops RESPIRATORY: Mild expiratory wheezing on right lung. ABDOMEN: Soft, non-tender, non-distended MUSCULOSKELETAL:  No edema; No deformity  SKIN: Warm and dry NEUROLOGIC:  Alert and oriented x 3 PSYCHIATRIC:  Normal affect   ASSESSMENT:    1. Coronary artery disease involving native coronary artery of native heart, unspecified whether angina present   2. Primary  hypertension  PLAN:    In order of problems listed above:  CAD/three-vessel coronary calcifications.  Last EF 60 to 65%.  Denies chest pain or shortness of breath.  Continue Crestor  5 mg daily.  LDL at goal.  Start aspirin 81 mg daily.  Plan ischemic workup if/when patient becomes symptomatic. Hypertension, BP controlled.  Continue HCTZ 12.5 mg daily, losartan  50 mg daily, Norvasc  5 mg daily.  Follow-up yearly     Medication Adjustments/Labs and Tests Ordered: Current medicines are reviewed at length with the patient today.  Concerns regarding medicines are outlined above.  Orders Placed This Encounter  Procedures   EKG 12-Lead   No orders of the defined types were placed in this encounter.   Patient Instructions  Medication Instructions:   START Aspirin - Take one tablet ( 81mg ) by mouth daily.   *If you need a refill on your cardiac medications before your next appointment, please call your pharmacy*   Lab Work:  None Ordered  If you have labs (blood work) drawn today and your tests are completely normal, you will receive your results only by: MyChart Message (if you have MyChart) OR A paper copy in the mail If you have any lab test that is abnormal or we need to change your treatment, we will call you to review the results.   Testing/Procedures:  None Ordered   Follow-Up: At [facility], you and your health needs are our priority.  As part of our continuing mission to provide you with exceptional heart care, we have created designated Provider Care Teams.  These Care Teams include your primary Cardiologist (physician) and Advanced Practice Providers (APPs -  Physician Assistants and Nurse Practitioners) who all work together to provide you with the care you need, when you need it.  We recommend signing up for the patient portal called "MyChart".  Sign up information is provided on this After Visit Summary.  MyChart is used to connect with patients for  Virtual Visits (Telemedicine).  Patients are able to view lab/test results, encounter notes, upcoming appointments, etc.  Non-urgent messages can be sent to your provider as well.   To learn more about what you can do with MyChart, go to [email or web address].    Your next appointment:   12 month(s)  Provider:   You may see Constancia Delton, MD or one of the following Advanced Practice Providers on your designated Care Team:   Laneta Pintos, NP Varney Gentleman, PA-C Cadence Gennaro Khat, PA-C Ronald Cockayne, NP Morey Ar, NP   Signed, Constancia Delton, MD  09/05/2023 3:24 PM    [facility]

## 2023-09-27 ENCOUNTER — Other Ambulatory Visit: Payer: Self-pay

## 2023-10-19 ENCOUNTER — Other Ambulatory Visit (HOSPITAL_COMMUNITY): Payer: Self-pay

## 2023-11-10 DIAGNOSIS — M5136 Other intervertebral disc degeneration, lumbar region with discogenic back pain only: Secondary | ICD-10-CM | POA: Diagnosis not present

## 2023-11-10 DIAGNOSIS — M5432 Sciatica, left side: Secondary | ICD-10-CM | POA: Diagnosis not present

## 2023-11-10 DIAGNOSIS — M9903 Segmental and somatic dysfunction of lumbar region: Secondary | ICD-10-CM | POA: Diagnosis not present

## 2023-11-10 DIAGNOSIS — M9905 Segmental and somatic dysfunction of pelvic region: Secondary | ICD-10-CM | POA: Diagnosis not present

## 2023-11-15 ENCOUNTER — Other Ambulatory Visit (HOSPITAL_COMMUNITY): Payer: Self-pay

## 2023-11-15 ENCOUNTER — Other Ambulatory Visit: Payer: Self-pay

## 2023-11-15 ENCOUNTER — Other Ambulatory Visit: Payer: Self-pay | Admitting: Urology

## 2023-11-15 MED ORDER — TAMSULOSIN HCL 0.4 MG PO CAPS
0.4000 mg | ORAL_CAPSULE | Freq: Every day | ORAL | 3 refills | Status: DC
  Filled 2023-11-15: qty 90, 90d supply, fill #0

## 2023-11-16 ENCOUNTER — Other Ambulatory Visit (HOSPITAL_COMMUNITY): Payer: Self-pay

## 2023-12-16 DIAGNOSIS — M9905 Segmental and somatic dysfunction of pelvic region: Secondary | ICD-10-CM | POA: Diagnosis not present

## 2023-12-16 DIAGNOSIS — M9903 Segmental and somatic dysfunction of lumbar region: Secondary | ICD-10-CM | POA: Diagnosis not present

## 2023-12-16 DIAGNOSIS — M5136 Other intervertebral disc degeneration, lumbar region with discogenic back pain only: Secondary | ICD-10-CM | POA: Diagnosis not present

## 2023-12-16 DIAGNOSIS — M5432 Sciatica, left side: Secondary | ICD-10-CM | POA: Diagnosis not present

## 2023-12-20 DIAGNOSIS — M5136 Other intervertebral disc degeneration, lumbar region with discogenic back pain only: Secondary | ICD-10-CM | POA: Diagnosis not present

## 2023-12-20 DIAGNOSIS — M5432 Sciatica, left side: Secondary | ICD-10-CM | POA: Diagnosis not present

## 2023-12-20 DIAGNOSIS — M9903 Segmental and somatic dysfunction of lumbar region: Secondary | ICD-10-CM | POA: Diagnosis not present

## 2023-12-20 DIAGNOSIS — M9905 Segmental and somatic dysfunction of pelvic region: Secondary | ICD-10-CM | POA: Diagnosis not present

## 2023-12-26 ENCOUNTER — Ambulatory Visit (INDEPENDENT_AMBULATORY_CARE_PROVIDER_SITE_OTHER): Admitting: Family Medicine

## 2023-12-26 ENCOUNTER — Other Ambulatory Visit (HOSPITAL_COMMUNITY): Payer: Self-pay

## 2023-12-26 VITALS — BP 130/60 | HR 95 | Ht 69.0 in | Wt 169.2 lb

## 2023-12-26 DIAGNOSIS — M4316 Spondylolisthesis, lumbar region: Secondary | ICD-10-CM | POA: Diagnosis not present

## 2023-12-26 DIAGNOSIS — M47817 Spondylosis without myelopathy or radiculopathy, lumbosacral region: Secondary | ICD-10-CM

## 2023-12-26 MED ORDER — PREDNISONE 20 MG PO TABS
20.0000 mg | ORAL_TABLET | Freq: Two times a day (BID) | ORAL | 0 refills | Status: AC
  Filled 2023-12-26: qty 14, 7d supply, fill #0

## 2023-12-27 ENCOUNTER — Other Ambulatory Visit: Payer: Self-pay

## 2023-12-28 NOTE — Assessment & Plan Note (Signed)
 History of Present Illness Isaac Weber "Athena Bland" is a 78 year old male who presents with left hip and low back pain radiating down the leg.  For the past month, he has been experiencing left hip pain that occasionally radiates down the leg, originating from the low back. The pain began after he started using two new golf clubs and attempted to hit the ball harder. Initially, he managed the pain with 800 mg of Motrin  taken the night before and the morning of playing golf, but this regimen is no longer effective. He is now unable to bend to place a tee in the ground.  He has visited a chiropractor twice, which usually alleviates his symptoms, but this time it did not help. He describes the pain as feeling 'higher' than usual, possibly involving the pelvis. He has not played golf for the past week due to the pain and stiffness.  No numbness or tingling in the leg. He has been using ice for relief. He reports tightness in the leg but no sharp pain or numbness during specific movements.  His past medical history includes spinal fusion with rods and screws at L4 and L5, which limits movement in that area. He is an active individual who enjoys playing golf.  He is currently using Motrin  for pain management and has a muscle relaxer, possibly methocarbamol , which he uses as needed.  Physical Exam PALPATION: Left sacroiliac joint tenderness. Palpable episapher lipoma with maximal tenderness. SPECIAL TESTS: Negative straight leg raise on the left. Negative piriformis testing on the left. Negative FADIR test. Negative FABER test.  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 No nerve involvement. Symptoms exacerbated by increased activity. - Prescribed 7-day oral steroids for inflammation. - Advised methocarbamol  as needed for muscle tightness. - Recommended avoiding strenuous activities, including full golf swings,  for one week. - Encouraged light activities such as walking and putting. - Instructed to report if symptoms persist or worsen after steroid course. - Sent prescription to [facility].

## 2023-12-28 NOTE — Patient Instructions (Signed)
 Patient Action Plan  1. Low Back Arthritis Management:    - Take the prescribed oral steroids for 7 days to help reduce inflammation.    - Use methocarbamol  as needed to relieve muscle tightness.  2. Activity Recommendations:    - Avoid strenuous activities, including full golf swings, for one week.    - Engage in light activities such as walking and putting.  3. Monitoring and Follow-Up:    - If symptoms persist or worsen after completing the steroid course, contact your healthcare provider.  Red Flags: Seek medical attention if you experience new or worsening pain, significant swelling, or any unusual symptoms during treatment.

## 2023-12-28 NOTE — Progress Notes (Signed)
 Primary Care / Sports Medicine Office Visit  Patient Information:  Patient ID: GRACEN SOUTHWELL, male DOB: [birth year] Age: 78 y.o. MRN: [id number]   DEUNTAE KOCSIS is a pleasant 78 y.o. male presenting with the following:  Chief Complaint  Patient presents with   Hip Pain    Left hip pain x 1 month. Pain occasionally radiates down left leg comes from low left back. Aggravating factors bending and putting on clothes. Patient has been taking motrin  800 mg in the morning which helps and in the evening he take tylenol  arthritis but that does not help. He does have a hx of back surgery. He has not had any injuries since this pain started. No imaging.    Vitals:   12/26/23 1626  BP: 130/60  Pulse: 95  SpO2: 98%   Vitals:   12/26/23 1626  Weight: 169 lb 3.2 oz (76.7 kg)  Height: 5\' 9"  (1.753 m)   Body mass index is 24.99 kg/m.  No results found.   Independent interpretation of notes and tests performed by another provider:   None  Procedures performed:   None  Pertinent History, Exam, Impression, and Recommendations:   Problem List Items Addressed This Visit     Spondylolisthesis of lumbar region   Spondylosis of lumbosacral region without myelopathy or radiculopathy - Primary   History of Present Illness WANDA CELLUCCI "Athena Bland" is a 78 year old male who presents with left hip and low back pain radiating down the leg.  For the past month, he has been experiencing left hip pain that occasionally radiates down the leg, originating from the low back. The pain began after he started using two new golf clubs and attempted to hit the ball harder. Initially, he managed the pain with 800 mg of Motrin  taken the night before and the morning of playing golf, but this regimen is no longer effective. He is now unable to bend to place a tee in the ground.  He has visited a chiropractor twice, which usually alleviates his symptoms, but this time it did not help. He describes the pain  as feeling 'higher' than usual, possibly involving the pelvis. He has not played golf for the past week due to the pain and stiffness.  No numbness or tingling in the leg. He has been using ice for relief. He reports tightness in the leg but no sharp pain or numbness during specific movements.  His past medical history includes spinal fusion with rods and screws at L4 and L5, which limits movement in that area. He is an active individual who enjoys playing golf.  He is currently using Motrin  for pain management and has a muscle relaxer, possibly methocarbamol , which he uses as needed.  Physical Exam PALPATION: Left sacroiliac joint tenderness. Palpable episapher lipoma with maximal tenderness. SPECIAL TESTS: Negative straight leg raise on the left. Negative piriformis testing on the left. Negative FADIR test. Negative FABER test.  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 No nerve involvement. Symptoms exacerbated by increased activity. - Prescribed 7-day oral steroids for inflammation. - Advised methocarbamol  as needed for muscle tightness. - Recommended avoiding strenuous activities, including full golf swings, for one week. - Encouraged light activities such as walking and putting. - Instructed to report if symptoms persist or worsen after steroid course. - Sent prescription to [facility].      Relevant Medications  predniSONE  (DELTASONE ) 20 MG tablet     Orders & Medications Medications:  Meds ordered this encounter  Medications   predniSONE  (DELTASONE ) 20 MG tablet    Sig: Take 1 tablet (20 mg total) by mouth 2 (two) times daily with a meal for 7 days.    Dispense:  14 tablet    Refill:  0   No orders of the defined types were placed in this encounter.    No follow-ups on file.     Ma Saupe, MD, [facility]   Primary Care Sports Medicine Primary Care and Sports  Medicine at [facility]

## 2024-01-01 ENCOUNTER — Encounter: Payer: Self-pay | Admitting: Family Medicine

## 2024-01-02 ENCOUNTER — Encounter: Payer: Self-pay | Admitting: Family Medicine

## 2024-01-02 ENCOUNTER — Ambulatory Visit (INDEPENDENT_AMBULATORY_CARE_PROVIDER_SITE_OTHER): Payer: Self-pay | Admitting: Family Medicine

## 2024-01-02 ENCOUNTER — Other Ambulatory Visit: Payer: Self-pay | Admitting: Family Medicine

## 2024-01-02 VITALS — BP 138/68 | HR 93 | Ht 69.0 in | Wt >= 6400 oz

## 2024-01-02 DIAGNOSIS — M47817 Spondylosis without myelopathy or radiculopathy, lumbosacral region: Secondary | ICD-10-CM | POA: Diagnosis not present

## 2024-01-02 DIAGNOSIS — E1169 Type 2 diabetes mellitus with other specified complication: Secondary | ICD-10-CM

## 2024-01-02 DIAGNOSIS — Z7984 Long term (current) use of oral hypoglycemic drugs: Secondary | ICD-10-CM | POA: Diagnosis not present

## 2024-01-02 DIAGNOSIS — H5203 Hypermetropia, bilateral: Secondary | ICD-10-CM | POA: Diagnosis not present

## 2024-01-02 DIAGNOSIS — H2513 Age-related nuclear cataract, bilateral: Secondary | ICD-10-CM | POA: Diagnosis not present

## 2024-01-02 DIAGNOSIS — N401 Enlarged prostate with lower urinary tract symptoms: Secondary | ICD-10-CM

## 2024-01-02 DIAGNOSIS — I10 Essential (primary) hypertension: Secondary | ICD-10-CM

## 2024-01-02 DIAGNOSIS — E119 Type 2 diabetes mellitus without complications: Secondary | ICD-10-CM | POA: Diagnosis not present

## 2024-01-02 DIAGNOSIS — H52223 Regular astigmatism, bilateral: Secondary | ICD-10-CM | POA: Diagnosis not present

## 2024-01-02 DIAGNOSIS — H524 Presbyopia: Secondary | ICD-10-CM | POA: Diagnosis not present

## 2024-01-02 DIAGNOSIS — Z0000 Encounter for general adult medical examination without abnormal findings: Secondary | ICD-10-CM

## 2024-01-02 LAB — POCT GLYCOSYLATED HEMOGLOBIN (HGB A1C): Hemoglobin A1C: 6.3 % — AB (ref 4.0–5.6)

## 2024-01-02 LAB — HM DIABETES EYE EXAM

## 2024-01-02 NOTE — Progress Notes (Signed)
 Subjective:    Patient ID: Isaac Weber, male    DOB: 21-Oct-1945, 78 y.o.   MRN: [id number]  Isaac Weber is a 78 y.o. male presenting on 01/02/2024 for Diabetes   HPI  Discussed the use of AI scribe software for clinical note transcription with the patient, who gave verbal consent to proceed.  History of Present Illness   Isaac Weber "Isaac Weber" is a 78 year old male with type 2 diabetes who presents for routine follow-up and management of his diabetes.   He experiences occasional sharp ear pain, particularly when tugging at his ear. He does not believe he has a wax problem and describes the pain as 'really sharp.' No frequent ear wax buildup.  He is currently experiencing hip pain, for which he is taking prednisone . He is on his third dose and reports some improvement, although the pain persists. He has a history of sacroiliac joint issues, which have been treated previously but did not respond to recent interventions. He is under the care of a sports doctor for this condition.  He is up to date with his pneumonia and shingles vaccinations, foot and urine checks, and had a colonoscopy in October 2022.      CHRONIC DM, Type 2 Hyperlipidemia ASCVD Risk LDL controlled. He remains on Rosuvastatin  5mg  daily for cardiovascular prevention. His recent A1c level is 6.3, with previous A1c levels over the past six months ranging from 6.3 to 7.0, indicating well-controlled diabetes. Meds: Metformin  XR 500mg  x2 = 1000mg  daily at bedtime, tolerating well OneTouch Ultra 2 test supplies  Reports good compliance. Tolerating well w/o side-effects Currently on ARB, Statin Followed by Dr Brooks Cao [facility] [place] [facility] later today 01/02/24 Denies hypoglycemia, polyuria, visual changes, numbness or tingling.   ASCVD Risk LDL controlled. He remains on Rosuvastatin  5mg  daily for cardiovascular prevention.   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 He gets BP checked every 8 weeks with donating blood. K has been stable on labs Current Meds - Losartan  50mg  and HCTZ 12.5mg  daily, Amlodipine  5mg  daily Reports good compliance, took meds today. Tolerating well, w/o complaints. Denies CP, dyspnea, HA, edema, dizziness / lightheadedness   History of Iron Deficiency Donates blood every 8 weeks. He takes iron supplement.   Sacroiliac /Lumbar Pain / Osteoarthritis, bilateral knees, lumbar spine Chronic problem, multiple joints, episodic pain and flares He has has seen chiropractor as well. following with Sports Med Dr Augustus Ledger, on steroid course Previously chiropractor helping now not anymore    PMH Bilateral Rotator Cuff Impingement / Shoulder Pain     Last Colonoscopy 04/30/21 - polyps, repeat 3 years in 2025 if interested. He declines today     01/02/2024   10:43 AM 07/04/2023    1:17 PM 04/22/2023    8:57 AM  Depression screen PHQ 2/9  Decreased Interest 0 0 0  Down, Depressed, Hopeless 0 0 0  PHQ - 2 Score 0 0 0  Altered sleeping 0  0  Tired, decreased energy 0  0  Change in appetite 0  0  Feeling bad or failure about yourself  0  0  Trouble concentrating 0  0  Moving slowly or fidgety/restless 0  0  Suicidal thoughts 0  0  PHQ-9 Score 0  0  Difficult doing work/chores Not difficult at all  Not difficult at all  01/02/2024   10:43 AM 07/04/2023    1:18 PM 02/25/2023    9:46 AM 12/23/2022    9:25 AM  GAD 7 : Generalized Anxiety Score  Nervous, Anxious, on Edge 0 0 0 0  Control/stop worrying 0 0 0 0  Worry too much - different things 0 0 0 0  Trouble relaxing 0 0 0 0  Restless 0 0 0 0  Easily annoyed or irritable 0 0 0 0  Afraid - awful might happen 0 0 0 0  Total GAD 7 Score 0 0 0 0  Anxiety Difficulty Not difficult at all  Not difficult at all     Social History   Tobacco Use   Smoking status: Former     Current packs/day: 0.00    Average packs/day: 3.0 packs/day for 26.0 years (78.0 ttl pk-yrs)    Types: Cigarettes    Start date: 09/16/1945    Quit date: 09/17/1971    Years since quitting: 52.3    Passive exposure: Past   Smokeless tobacco: Never  Vaping Use   Vaping status: Never Used  Substance Use Topics   Alcohol use: Yes    Alcohol/week: 2.0 standard drinks of alcohol    Types: 2 Standard drinks or equivalent per week   Drug use: Never    Review of Systems Per HPI unless specifically indicated above     Objective:     BP 138/68 (BP Location: Left Arm, Cuff Size: Normal)   Pulse 93   Ht 5\' 9"  (1.753 m)   Wt (!) 1065 lb 6 oz (483.3 kg)   SpO2 98%   BMI 157.33 kg/m   Wt Readings from Last 3 Encounters:  01/02/24 (!) 1065 lb 6 oz (483.3 kg)  12/26/23 169 lb 3.2 oz (76.7 kg)  09/05/23 166 lb 9.6 oz (75.6 kg)    Physical Exam Vitals and nursing note reviewed.  Constitutional:      General: He is not in acute distress.    Appearance: Normal appearance. He is well-developed. He is not diaphoretic.     Comments: Well-appearing, comfortable, cooperative  HENT:     Head: Normocephalic and atraumatic.  Eyes:     General:        Right eye: No discharge.        Left eye: No discharge.     Conjunctiva/sclera: Conjunctivae normal.  Cardiovascular:     Rate and Rhythm: Normal rate.  Pulmonary:     Effort: Pulmonary effort is normal.  Skin:    General: Skin is warm and dry.     Findings: No erythema or rash.  Neurological:     Mental Status: He is alert and oriented to person, place, and time.  Psychiatric:        Mood and Affect: Mood normal.        Behavior: Behavior normal.        Thought Content: Thought content normal.     Comments: Well groomed, good eye contact, normal speech and thoughts     Results for orders placed or performed in visit on 01/02/24  POCT HgB A1C   Collection Time: 01/02/24 10:49 AM  Result Value Ref Range   Hemoglobin A1C 6.3 (A) 4.0  - 5.6 %   HbA1c POC (<> result, manual entry)     HbA1c, POC (prediabetic range)     HbA1c, POC (controlled diabetic range)        Assessment & Plan:   Problem List Items Addressed This  Visit     Spondylosis of lumbosacral region without myelopathy or radiculopathy   Type 2 diabetes mellitus with other specified complication (HCC) - Primary   Relevant Orders   POCT HgB A1C (Completed)   Other Visit Diagnoses       Long term current use of oral hypoglycemic drug            Ear Pain Intermittent sharp pain due to a small abrasion in the left ear canal. - Avoid inserting objects into the ear canal.  Sacroiliac Joint Dysfunction Hip pain managed with prednisone  under Sports Med Dr. Augustus Ledger' care. - Continue prednisone  as prescribed by Dr. Augustus Ledger. Caution hyperglycemia - Follow up with Dr. Augustus Ledger for ongoing management.  Type 2 Diabetes Mellitus Well-controlled with A1c at 6.3% on metformin . - Continue metformin  extended release 1000 mg in the evening. - Monitor A1c regularly.  General Health Maintenance Up to date on vaccinations and screenings. Discussed Cologuard as an alternative to colonoscopy due to age. - Consider Cologuard test for colon cancer screening. - Continue regular health maintenance checks.  Follow-up Scheduled for follow-up in six months with pre-appointment blood work. - Schedule follow-up appointment in December. - Perform blood work prior to the next physical examination.        Orders Placed This Encounter  Procedures   POCT HgB A1C    No orders of the defined types were placed in this encounter.   Follow up plan: Return for 6 month fasting lab > 1 week later Annual Physical.  Future labs ordered for 07/04/24   Domingo Friend, DO [facility] 01/02/2024, 11:07 AM

## 2024-01-02 NOTE — Patient Instructions (Addendum)
 Thank you for coming to the office today.  DUE for FASTING BLOOD WORK (no food or drink after midnight before the lab appointment, only water or coffee without cream/sugar on the morning of)  SCHEDULE "Lab Only" visit in the morning at the clinic for lab draw in 6 MONTHS   - Make sure Lab Only appointment is at about 1 week before your next appointment, so that results will be available  For Lab Results, once available within 2-3 days of blood draw, you can can log in to MyChart online to view your results and a brief explanation. Also, we can discuss results at next follow-up visit.   Please schedule a Follow-up Appointment to: Return for 6 month fasting lab > 1 week later Annual Physical.  If you have any other questions or concerns, please feel free to call the office or send a message through MyChart. You may also schedule an earlier appointment if necessary.  Additionally, you may be receiving a survey about your experience at our office within a few days to 1 week by e-mail or mail. We value your feedback.  Saralyn Pilar, DO [facility] [place], [place]

## 2024-01-09 ENCOUNTER — Encounter: Payer: Self-pay | Admitting: Family Medicine

## 2024-01-12 ENCOUNTER — Other Ambulatory Visit: Payer: Self-pay

## 2024-01-12 ENCOUNTER — Ambulatory Visit: Payer: Self-pay | Admitting: Urology

## 2024-01-12 ENCOUNTER — Other Ambulatory Visit (HOSPITAL_COMMUNITY): Payer: Self-pay

## 2024-01-12 VITALS — BP 120/62 | HR 73 | Ht 69.0 in | Wt 164.0 lb

## 2024-01-12 DIAGNOSIS — N401 Enlarged prostate with lower urinary tract symptoms: Secondary | ICD-10-CM

## 2024-01-12 DIAGNOSIS — Z125 Encounter for screening for malignant neoplasm of prostate: Secondary | ICD-10-CM

## 2024-01-12 DIAGNOSIS — R351 Nocturia: Secondary | ICD-10-CM | POA: Diagnosis not present

## 2024-01-12 LAB — BLADDER SCAN AMB NON-IMAGING

## 2024-01-12 MED ORDER — TAMSULOSIN HCL 0.4 MG PO CAPS
0.8000 mg | ORAL_CAPSULE | Freq: Every day | ORAL | 6 refills | Status: AC
  Filled 2024-01-12 – 2024-01-23 (×6): qty 90, 45d supply, fill #0
  Filled 2024-03-06 – 2024-03-07 (×2): qty 90, 45d supply, fill #1
  Filled 2024-04-13: qty 90, 45d supply, fill #2
  Filled 2024-06-02: qty 180, 90d supply, fill #3
  Filled 2024-08-29: qty 180, 90d supply, fill #4

## 2024-01-12 NOTE — Patient Instructions (Signed)

## 2024-01-12 NOTE — Progress Notes (Signed)
   01/12/2024 10:25 AM   Isaac Weber [birth year] [id number]  Reason for visit: Follow up BPH, nocturia, incomplete bladder emptying, PSA screening  HPI: Healthy 78 year old male who I originally saw in March 2022.  He has a history of BPH and previously underwent a TUNA procedure as well as a greenlight laser PVP with an outside urologist.  These temporarily improved his urinary symptoms.  He has been on Flomax  at least a few years.  PVRs have been mildly elevated up to in the past, PVR today borderline at .  Primary bothersome urinary symptom is frequency, and nocturia 3-4 times overnight.  We discussed alternative options for his persistent urinary symptoms including doubling the Flomax  dose, addition of finasteride, or cystoscopy TRUS for consideration of outlet procedures.  Using shared decision making he would like to increase the Flomax  first, risks and benefits were discussed.  Recent PSA December 2024 was normal at 3.3 which is stable over the last few years.  Reassurance provided regarding normal PSA, would recommend discontinuing screening per the AUA guideline recommendations.  -Flomax  dose increased to 0.8 mg nightly -RTC 1 year PVR  Lawerence Pressman, MD   [facility] [address] [place], [place] [address] [phone]

## 2024-01-16 ENCOUNTER — Other Ambulatory Visit (HOSPITAL_COMMUNITY): Payer: Self-pay

## 2024-01-16 ENCOUNTER — Other Ambulatory Visit: Payer: Self-pay

## 2024-01-17 ENCOUNTER — Other Ambulatory Visit: Payer: Self-pay

## 2024-01-17 MED ORDER — CIPROFLOXACIN HCL 500 MG PO TABS
500.0000 mg | ORAL_TABLET | Freq: Two times a day (BID) | ORAL | 0 refills | Status: DC
  Filled 2024-01-17: qty 20, 10d supply, fill #0

## 2024-01-17 NOTE — Telephone Encounter (Signed)
  Multiple efforts have failed, previous responded to cipro . I will send in Rx cipro  500mg  twice daily x 10 days for bronchiectasis exacerbation

## 2024-01-17 NOTE — Telephone Encounter (Signed)
**Note De-identified  [place] Obfuscation** Please advise 

## 2024-01-18 ENCOUNTER — Other Ambulatory Visit: Payer: Self-pay

## 2024-01-18 ENCOUNTER — Other Ambulatory Visit (HOSPITAL_COMMUNITY): Payer: Self-pay

## 2024-01-19 ENCOUNTER — Ambulatory Visit: Payer: PPO | Admitting: Dermatology

## 2024-01-19 ENCOUNTER — Other Ambulatory Visit (HOSPITAL_COMMUNITY): Payer: Self-pay

## 2024-01-19 ENCOUNTER — Encounter: Payer: Self-pay | Admitting: Dermatology

## 2024-01-19 DIAGNOSIS — L570 Actinic keratosis: Secondary | ICD-10-CM | POA: Diagnosis not present

## 2024-01-19 DIAGNOSIS — D1801 Hemangioma of skin and subcutaneous tissue: Secondary | ICD-10-CM

## 2024-01-19 DIAGNOSIS — L578 Other skin changes due to chronic exposure to nonionizing radiation: Secondary | ICD-10-CM | POA: Diagnosis not present

## 2024-01-19 DIAGNOSIS — L219 Seborrheic dermatitis, unspecified: Secondary | ICD-10-CM

## 2024-01-19 DIAGNOSIS — D692 Other nonthrombocytopenic purpura: Secondary | ICD-10-CM

## 2024-01-19 DIAGNOSIS — L820 Inflamed seborrheic keratosis: Secondary | ICD-10-CM

## 2024-01-19 DIAGNOSIS — L814 Other melanin hyperpigmentation: Secondary | ICD-10-CM | POA: Diagnosis not present

## 2024-01-19 DIAGNOSIS — Z79899 Other long term (current) drug therapy: Secondary | ICD-10-CM

## 2024-01-19 DIAGNOSIS — L821 Other seborrheic keratosis: Secondary | ICD-10-CM

## 2024-01-19 DIAGNOSIS — W908XXA Exposure to other nonionizing radiation, initial encounter: Secondary | ICD-10-CM

## 2024-01-19 DIAGNOSIS — D229 Melanocytic nevi, unspecified: Secondary | ICD-10-CM

## 2024-01-19 DIAGNOSIS — Z85828 Personal history of other malignant neoplasm of skin: Secondary | ICD-10-CM | POA: Diagnosis not present

## 2024-01-19 DIAGNOSIS — Z7189 Other specified counseling: Secondary | ICD-10-CM

## 2024-01-19 DIAGNOSIS — Z1283 Encounter for screening for malignant neoplasm of skin: Secondary | ICD-10-CM

## 2024-01-19 DIAGNOSIS — Z8589 Personal history of malignant neoplasm of other organs and systems: Secondary | ICD-10-CM

## 2024-01-19 NOTE — Progress Notes (Signed)
 Follow-Up Visit   Subjective  Isaac Weber is a 78 y.o. male who presents for the following: Skin Cancer Screening and Full Body Skin Exam, hx of SCC  The patient presents for Total-Body Skin Exam (TBSE) for skin cancer screening and mole check. The patient has spots, moles and lesions to be evaluated, some may be new or changing and the patient may have concern these could be cancer.  The following portions of the chart were reviewed this encounter and updated as appropriate: medications, allergies, medical history  Review of Systems:  No other skin or systemic complaints except as noted in HPI or Assessment and Plan.  Objective  Well appearing patient in no apparent distress; mood and affect are within normal limits.  A full examination was performed including scalp, head, eyes, ears, nose, lips, neck, chest, axillae, abdomen, back, buttocks, bilateral upper extremities, bilateral lower extremities, hands, feet, fingers, toes, fingernails, and toenails. All findings within normal limits unless otherwise noted below.   Relevant physical exam findings are noted in the Assessment and Plan.  back x 2, right elbow x 1 (3) Stuck-on, waxy, tan-brown papules and plaques -- Discussed benign etiology and prognosis.  face,ears,hands (25) Erythematous thin papules/macules with gritty scale.   Assessment & Plan   SKIN CANCER SCREENING PERFORMED TODAY.  LENTIGINES, SEBORRHEIC KERATOSES, HEMANGIOMAS - Benign normal skin lesions - Benign-appearing - Call for any changes  MELANOCYTIC NEVI - Tan-brown and/or pink-flesh-colored symmetric macules and papules - Benign appearing on exam today - Observation - Call clinic for new or changing moles - Recommend daily use of broad spectrum spf 30+ sunscreen to sun-exposed areas.   Purpura - Chronic; persistent and recurrent.  Treatable, but not curable. Arms  - Violaceous macules and patches - Benign - Related to trauma, age, sun damage and/or  use of blood thinners, chronic use of topical and/or oral steroids - Observe - Can use OTC arnica containing moisturizer such as Dermend Bruise Formula if desired - Call for worsening or other concerns   SEBORRHEIC DERMATITIS scalp Exam: scalp clear today Chronic condition with duration or expected duration over one year. Currently well-controlled.  Seborrheic Dermatitis is a chronic persistent rash characterized by pinkness and scaling most commonly of the mid face but also can occur on the scalp (dandruff), ears; mid chest, mid back and groin.  It tends to be exacerbated by stress and cooler weather.  People who have neurologic disease may experience new onset or exacerbation of existing seborrheic dermatitis.  The condition is not curable but treatable and can be controlled. Treatment Plan: Continue otc dandruff shampoo  Patient decline restarting Ketoconazole  shampoo at this time.  HISTORY OF SQUAMOUS CELL CARCINOMA OF THE SKIN - No evidence of recurrence today - No lymphadenopathy - Recommend regular full body skin exams - Recommend daily broad spectrum sunscreen SPF 30+ to sun-exposed areas, reapply every 2 hours as needed.  - Call if any new or changing lesions are noted between office visits   INFLAMED SEBORRHEIC KERATOSIS (3) back x 2, right elbow x 1 (3) Symptomatic, irritating, patient would like treated.  Destruction of lesion - back x 2, right elbow x 1 (3) Complexity: simple   Destruction method: cryotherapy   Informed consent: discussed and consent obtained   Timeout:  patient name, date of birth, surgical site, and procedure verified Lesion destroyed using liquid nitrogen: Yes   Region frozen until ice ball extended beyond lesion: Yes   Outcome: patient tolerated procedure well with no  complications   Post-procedure details: wound care instructions given   AK (ACTINIC KERATOSIS) (25) face,ears,hands (25) ACTINIC DAMAGE - chronic, secondary to cumulative UV  radiation exposure/sun exposure over time - diffuse scaly erythematous macules with underlying dyspigmentation - Recommend daily broad spectrum sunscreen SPF 30+ to sun-exposed areas, reapply every 2 hours as needed.  - Recommend staying in the shade or wearing long sleeves, sun glasses (UVA+UVB protection) and wide brim hats (4-inch brim around the entire circumference of the hat). - Call for new or changing lesions.  Destruction of lesion - face,ears,hands (25) Complexity: simple   Destruction method: cryotherapy   Informed consent: discussed and consent obtained   Timeout:  patient name, date of birth, surgical site, and procedure verified Lesion destroyed using liquid nitrogen: Yes   Region frozen until ice ball extended beyond lesion: Yes   Outcome: patient tolerated procedure well with no complications   Post-procedure details: wound care instructions given    Return in about 1 year (around 01/18/2025) for TBSE, hx of SCC, hx of precancers .  IFay Kirks, CMA, am acting as scribe for Alm Rhyme, MD .   Documentation: I have reviewed the above documentation for accuracy and completeness, and I agree with the above.  Alm Rhyme, MD

## 2024-01-19 NOTE — Patient Instructions (Addendum)

## 2024-01-23 ENCOUNTER — Other Ambulatory Visit (HOSPITAL_COMMUNITY): Payer: Self-pay

## 2024-01-25 ENCOUNTER — Other Ambulatory Visit (HOSPITAL_COMMUNITY): Payer: Self-pay

## 2024-02-08 ENCOUNTER — Other Ambulatory Visit (HOSPITAL_COMMUNITY): Payer: Self-pay

## 2024-02-08 NOTE — Progress Notes (Unsigned)
 @Patient  ID: Isaac Weber, male    DOB: 1946/04/28, 78 y.o.   MRN: [id number]  No chief complaint on file.   Referring provider: Edman Blunt *  HPI:  78 year old, former smoker quit in 1973 (78 pack year hx).  Past medical history significant for hypertension, emphysema, cough variant asthma, hyperlipidemia, iron deficiency anemia. Patient of Dr. Geronimo.    Previous [facility] pulmonary encounter: 06/13/2023 -   Chief Complaint  Patient presents with   Follow-up    Bronch f/u    Isaac Weber 78 y.o. -chronic cough returns for follow-up.  After the bronchoscopy is doing well.  Cough is minimal.  He says at this point in time he is try Trelegy Asmanex  and Spiriva  a for the cough.  Wife is also here she is independent historian affirms the same.  But these have not helped.  Previous gabapentin  has not helped.  He was really confirmed about recurrence of colonization.  So we did a bronchoscopy with lavage number 12/13/2022.  The liver shows a lot of neutrophils just like last time but cultures negative so far.  At this point in time I reviewed the results and shared it with him.  He and his wife have resolved to just tolerate the cough.  After he left I heard from one of my colleagues that one of his patients reported improvement with some over-the-counter cough remedies.  I have asked the CMA to share this with the patient.    Dr Felice Reflux Symptom Index (> 13-15 suggestive of LPR cough) Results for Isaac, Weber (MRN [id number]) as of 05/31/2018 09:46  Ref. Range 03/09/2018 18:36 03/09/2018 18:45  Eosinophils Absolute Latest Ref Range: 0.0 - 0.7 K/uL 0.4    0 -> 5  =  none ->severe problem.td 07/08/2017  08/18/2017  02/28/2018  05/31/2018 asmanex  1 bid 02/20/2019  01/16/2021  03/23/2021  11/20/2021 Broch and then triple organism+ 03/29/2023  06/13/2023   Hoarseness of problem with voice 1 2 2 3 3 1 1 1  0 0  Clearing  Of Throat 3 2 2  0 2 3 2 3 2  0  Excess throat mucus  or feeling of post nasal drip 1 0 3 0 3 5 4 5 0 0   Difficulty swallowing food, liquid or tablets 0 0 0 0 0 0 0 0 0 0   Cough after eating or lying down 4 1 5 1 1 5 5 4 4 2   Breathing difficulties or choking episodes 0 0 0 0 0 4 3 4  ` 0  Troublesome or annoying cough 5 2 5 1 3 5 5 5 5 3   Sensation of something sticking in throat or lump in throat 0 0 0 0 0 0 0 0 0 0   Heartburn, chest pain, indigestion, or stomach acid coming up 0 0 0 0 0 0 2 0 0 0   TOTAL 14 7 17 5 12 23 22 22 12 5      Patient Instructions     ICD-10-CM   1. Chronic cough  R05.3     2. Bronchiectasis without complication (HCC)  J47.9      Currently cough is at an acceptable level in terms of quality of life interference.  Bronchoscopy 05/30/2023 without any growth in culture but does show inflammatory cells on a chronic basis.  Multiple different treatment efforts have failed in the past.  Currently basis supportive care  Plan - New ongoing supportive care  Follow-up - 6 months or sooner  if needed   02/09/2024- Interim hx  Discussed the use of AI scribe software for clinical note transcription with the patient, who gave verbal consent to proceed.  History of Present Illness   Isaac Weber is a 78 year old male who presents with a persistent cough.  He has experienced a persistent cough that has worsened since April. The cough is mostly dry, with occasional minimal mucus production. He has tried various treatments, including 750 mg of Cipro  twice a day, which was previously effective, but a recent course of 500 mg did not alleviate symptoms. He has also used mullein tea and Mucinex  with varying success. No acid reflux or fever is reported.  The cough is causing significant fatigue, leading to early bedtimes due to exhaustion. He experiences shortness of breath with exertion, such as bending or cleaning shower, which resolves quickly. He occasionally uses a rescue inhaler and has a flutter valve, though he  admits to inconsistent use.  He recalls a bronchoscopy that showed no growth and multiple failed treatments in the past. A chest x-ray was performed in November, and he was on prednisone  for hip pain about a month ago, though the dose is not recalled.  He wants to return to playing golf.     Allergies  Allergen Reactions   Morphine Diarrhea, Nausea And Vomiting and Other (See Comments)   Morphine And Codeine  Diarrhea and Nausea And Vomiting    Immunization History  Administered Date(s) Administered   Fluad Quad(high Dose 65+) 05/13/2019, 06/09/2020, 04/21/2021, 04/22/2022   Fluad Trivalent(High Dose 65+) 04/21/2023   Influenza, High Dose Seasonal PF 07/15/2017, 04/12/2018   PFIZER(Purple Top)SARS-COV-2 Vaccination 02/13/2020, 03/05/2020   PNEUMOCOCCAL CONJUGATE-20 07/04/2023   Pneumococcal Conjugate-13 01/28/2014   Pneumococcal Polysaccharide-23 03/04/2008, 08/26/2020   Tdap 07/12/2011   Zoster Recombinant(Shingrix) 04/12/2017, 08/17/2017    Past Medical History:  Diagnosis Date   Abnormal chest CT    Actinic keratosis    Allergic rhinitis, unspecified    Anemia    Aortic atherosclerosis (HCC)    Arthritis    BPH (benign prostatic hyperplasia)    COPD (chronic obstructive pulmonary disease) (HCC)    Coronary artery disease    Cough variant asthma    Diabetes mellitus without complication (HCC)    Glaucoma    Hypertension    Other chronic pain    Pancreatic lesion    Pneumonia    Pure hypercholesterolemia, unspecified    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 WD SCC, ulcerated. [facility] 04/29/2020    Tobacco History: Social History   Tobacco Use  Smoking Status Former   Current  packs/day: 0.00   Average packs/day: 3.0 packs/day for 26.0 years (78.0 ttl pk-yrs)   Types: Cigarettes   Start date: 09/16/1946   Quit date: 09/17/1971   Years since quitting: 52.4   Passive exposure: Past  Smokeless Tobacco Never   Counseling given: Not Answered   Outpatient Medications Prior to Visit  Medication Sig Dispense Refill   albuterol  (VENTOLIN  HFA) 108 (90 Base) MCG/ACT inhaler Inhale 2 puffs into the lungs every 6 (six) hours as needed for wheezing or shortness of breath. 6.7 g 0   amLODipine  (NORVASC )  5 MG tablet Take 1 tablet (5 mg total) by mouth daily. 90 tablet 3   Blood Glucose Monitoring Suppl (ONE TOUCH ULTRA 2) w/Device KIT Use to check blood sugar up to 2 times daily 1 kit 0   ciprofloxacin  (CIPRO ) 500 MG tablet Take 1 tablet (500 mg total) by mouth 2 (two) times daily. 20 tablet 0   ferrous sulfate 325 (65 FE) MG tablet Take 325 mg by mouth daily with breakfast.     FIBER COMPLETE PO Take by mouth.     Glucosamine-Chondroitin (GLUCOSAMINE CHONDR COMPLEX PO) Take 1 capsule by mouth 2 (two) times daily.     hydrochlorothiazide  (HYDRODIURIL ) 12.5 MG tablet Take 1 tablet (12.5 mg total) by mouth daily. 90 tablet 3   ketoconazole  (NIZORAL ) 2 % shampoo Apply to scalp and let sit for 10 minutes then wash off, use 3 times a week. 120 mL 10   losartan  (COZAAR ) 50 MG tablet Take 1 tablet (50 mg total) by mouth daily. 90 tablet 3   metFORMIN  (GLUCOPHAGE -XR) 500 MG 24 hr tablet Take 2 tablets (1,000 mg total) by mouth daily with supper. 180 tablet 3   Multiple Minerals-Vitamins (CAL MAG ZINC +D3 PO) Take 1 tablet by mouth every evening.     Multiple Vitamin (MULTIVITAMIN WITH MINERALS) TABS tablet Take 1 tablet by mouth daily.     ONETOUCH ULTRA test strip Use to check blood sugar up to twice per day 200 each 5   rosuvastatin  (CRESTOR ) 5 MG tablet Take 1 tablet (5 mg total) by mouth at bedtime. 90 tablet 3   tamsulosin  (FLOMAX ) 0.4 MG CAPS capsule Take 2 capsules (0.8 mg  total) by mouth daily after supper. 90 capsule 6   No facility-administered medications prior to visit.      Review of Systems  Review of Systems  Constitutional: Negative.   HENT: Negative.    Respiratory:  Positive for cough, shortness of breath and wheezing.   Cardiovascular: Negative.      Physical Exam  There were no vitals taken for this visit. Physical Exam Constitutional:      General: He is not in acute distress.    Appearance: Normal appearance. He is not ill-appearing.  HENT:     Head: Normocephalic and atraumatic.  Cardiovascular:     Rate and Rhythm: Normal rate and regular rhythm.  Pulmonary:     Effort: Pulmonary effort is normal.     Breath sounds: Examination of the left-middle field reveals wheezing. Examination of the left-lower field reveals wheezing and rhonchi. Wheezing and rhonchi present.  Musculoskeletal:        General: Normal range of motion.  Skin:    General: Skin is warm and dry.  Neurological:     General: No focal deficit present.     Mental Status: He is alert and oriented to person, place, and time. Mental status is at baseline.  Psychiatric:        Mood and Affect: Mood normal.        Behavior: Behavior normal.        Thought Content: Thought content normal.        Judgment: Judgment normal.      Lab Results:  CBC    Component Value Date/Time   WBC 6.1 06/27/2023 1015   RBC 4.05 (L) 06/27/2023 1015   HGB 13.0 (L) 06/27/2023 1015   HGB 11.6 (L) 09/16/2014 1354   HCT 36.6 (L) 06/27/2023 1015   HCT 33.6 (L) 09/16/2014 1354  PLT 192 06/27/2023 1015   PLT 232 09/16/2014 1354   MCV 90.4 06/27/2023 1015   MCV 81.4 09/16/2014 1354   MCH 32.1 06/27/2023 1015   MCHC 35.5 06/27/2023 1015   RDW 12.9 06/27/2023 1015   RDW 13.9 09/16/2014 1354   LYMPHSABS 1,632 12/16/2022 0818   LYMPHSABS 1.8 09/16/2014 1354   MONOABS 0.7 01/28/2022 1012   MONOABS 0.6 09/16/2014 1354   EOSABS 348 06/27/2023 1015   EOSABS 0.3 09/16/2014 1354    BASOSABS 49 06/27/2023 1015   BASOSABS 0.0 09/16/2014 1354    BMET    Component Value Date/Time   NA 136 06/27/2023 1015   NA 140 09/16/2014 1354   K 4.4 06/27/2023 1015   K 3.7 09/16/2014 1354   CL 101 06/27/2023 1015   CO2 26 06/27/2023 1015   CO2 26 09/16/2014 1354   GLUCOSE 119 (H) 06/27/2023 1015   GLUCOSE 153 (H) 09/16/2014 1354   BUN 17 06/27/2023 1015   BUN 16.7 09/16/2014 1354   CREATININE 0.78 06/27/2023 1015   CREATININE 0.9 09/16/2014 1354   CALCIUM  9.5 06/27/2023 1015   CALCIUM  9.4 09/16/2014 1354   GFRNONAA >60 05/14/2022 0306   GFRNONAA 88 06/02/2020 0852   GFRAA 102 06/02/2020 0852    BNP No results found for: BNP  ProBNP    Component Value Date/Time   PROBNP 13.0 01/15/2022 1503    Imaging: No results found.   Assessment & Plan:   No problem-specific Assessment & Plan notes found for this encounter.   Assessment and Plan    Chronic Cough Long standing hx. Bronchoscopy 05/30/2023 without any growth in culture but does show inflammatory cells on a chronic basis.  Multiple different treatment efforts have failed in the past.  Persistent cough since April, worse than baseline, without mucus production. Previous treatments, including Cipro  500 mg, were ineffective. Wheezing noted on the right side, possibly due to airway inflammation or mucus obstruction. - Prescribe promethazine  DM for cough, cautioning against driving until effects are known. - Order chest x-ray to evaluate lung condition. - Consider increasing Cipro  to 750 mg if symptoms persist and chest x-ray indicates bacterial infection. - Advise use of flutter valve two to three times a day.  Wheezing Wheezing noted on the right side during auscultation, possibly due to airway inflammation or mucus lining. No fever reported. - Rx prednisone  taper 40mg  x 3 days, 30mg  x 3 days, 20mg  x 3 days, 10mg  x 3 days  Shortness of Breath Shortness of breath with exertion, particularly when bending  or performing physical tasks. No shortness of breath directly with coughing. Rescue inhaler available for use as needed. - Advise use of Albuterol  rescue inhaler as needed for shortness of breath. - Encourage consistent use of flutter valve to improve lung function.  Recording duration: 10 minutes      Almarie LELON Ferrari, NP 02/08/2024

## 2024-02-09 ENCOUNTER — Other Ambulatory Visit: Payer: Self-pay

## 2024-02-09 ENCOUNTER — Other Ambulatory Visit (HOSPITAL_COMMUNITY): Payer: Self-pay

## 2024-02-09 ENCOUNTER — Encounter: Payer: Self-pay | Admitting: Primary Care

## 2024-02-09 ENCOUNTER — Ambulatory Visit (INDEPENDENT_AMBULATORY_CARE_PROVIDER_SITE_OTHER)

## 2024-02-09 ENCOUNTER — Ambulatory Visit: Admitting: Primary Care

## 2024-02-09 ENCOUNTER — Ambulatory Visit: Payer: Self-pay | Admitting: Primary Care

## 2024-02-09 ENCOUNTER — Encounter: Payer: Self-pay | Admitting: Pharmacist

## 2024-02-09 VITALS — BP 129/67 | HR 88 | Ht 69.0 in | Wt 169.4 lb

## 2024-02-09 DIAGNOSIS — R0602 Shortness of breath: Secondary | ICD-10-CM | POA: Diagnosis not present

## 2024-02-09 DIAGNOSIS — R053 Chronic cough: Secondary | ICD-10-CM

## 2024-02-09 DIAGNOSIS — J471 Bronchiectasis with (acute) exacerbation: Secondary | ICD-10-CM

## 2024-02-09 DIAGNOSIS — I700 Atherosclerosis of aorta: Secondary | ICD-10-CM | POA: Diagnosis not present

## 2024-02-09 DIAGNOSIS — J479 Bronchiectasis, uncomplicated: Secondary | ICD-10-CM | POA: Diagnosis not present

## 2024-02-09 DIAGNOSIS — R062 Wheezing: Secondary | ICD-10-CM

## 2024-02-09 DIAGNOSIS — Z87891 Personal history of nicotine dependence: Secondary | ICD-10-CM | POA: Diagnosis not present

## 2024-02-09 MED ORDER — PREDNISONE 10 MG PO TABS
ORAL_TABLET | ORAL | 0 refills | Status: AC
  Filled 2024-02-09 (×2): qty 30, 12d supply, fill #0

## 2024-02-09 MED ORDER — PREDNISONE 10 MG PO TABS
ORAL_TABLET | ORAL | 0 refills | Status: DC
  Filled 2024-02-09: qty 20, fill #0

## 2024-02-09 MED ORDER — PROMETHAZINE-DM 6.25-15 MG/5ML PO SYRP
5.0000 mL | ORAL_SOLUTION | Freq: Four times a day (QID) | ORAL | 0 refills | Status: DC | PRN
  Filled 2024-02-09 (×2): qty 240, 12d supply, fill #0

## 2024-02-09 NOTE — Patient Instructions (Signed)
  VISIT SUMMARY: You came in today because of a persistent cough that has been getting worse since April. You mentioned that the cough is mostly dry with occasional minimal mucus production and that it has been causing significant fatigue and shortness of breath with exertion. You have tried various treatments, including antibiotics and over-the-counter medications, with limited success. You also mentioned using a rescue inhaler and a flutter valve inconsistently.  YOUR PLAN: -CHRONIC COUGH: A chronic cough is a cough that lasts for an extended period of time. It can be caused by various factors, including infections, inflammation, or other underlying conditions. For your cough, we will prescribe promethazine  DM and prednisone  taper. We will also order a chest x-ray to check your lung condition. If a bacterial infection is indicated, we might increase your Cipro  dosage to 750 mg. Additionally, please use your flutter valve two to three times a day to help clear your airways.  -SHORTNESS OF BREATH: Shortness of breath is a feeling of not being able to get enough air, especially during physical activities. You mentioned experiencing this when bending or doing physical tasks. Please use your rescue inhaler as needed for relief and try to use your flutter valve consistently to improve your lung function.  INSTRUCTIONS: Please follow up with us  after your chest x-ray so we can review the results and adjust your treatment plan if necessary. Continue using your rescue inhaler and flutter valve as advised. If your symptoms worsen or you experience any new symptoms, contact us  immediately.  Orders: -CXR  Rx: -Prednisone  taper 40mg  x 3 days, 30mg  x 3 days, 20mg  x 3 days, 10mg  x 3 days -Promethazine  DM every 6 hours as needed for cough   Follow-up -Let me know how you are doing via MyChart in the next several days, if not better consider increased dose cipro  750 x 14 days  -Return to Dr. Rosalynd or [facility]  NP in 6 months or sooner if needed

## 2024-02-09 NOTE — Progress Notes (Signed)
 ATC X1. LMTCB

## 2024-02-09 NOTE — Progress Notes (Signed)
Please let patient know CXR showed no acute cardiopulmonary process.

## 2024-02-10 NOTE — Telephone Encounter (Deleted)
 Copied from CRM [id number]. Topic: Clinical - Lab/Test Results >> Feb 10, 2024  8:50 AM Benton KIDD wrote: Reason for CRM: patient is returning a call from [place] concerning his results . Please reach back out to patient  [phone]  Tried calling the patient again. No answer- left detailed vm with CXR results per [name]  Please let patient know CXR showed no acute cardiopulmonary process

## 2024-02-10 NOTE — Telephone Encounter (Signed)
 Copied from CRM [id number]. Topic: Clinical - Lab/Test Results >> Feb 10, 2024  8:50 AM Benton KIDD wrote: Reason for CRM: patient is returning a call from [place] concerning his results . Please reach back out to patient  [phone]  Tried calling the patient again. No answer- left detailed vm per DPR with CXR results per [facility]  Please let patient know CXR showed no acute cardiopulmonary process

## 2024-02-13 ENCOUNTER — Other Ambulatory Visit (HOSPITAL_COMMUNITY): Payer: Self-pay

## 2024-02-24 NOTE — Telephone Encounter (Signed)
**Note De-identified  [place] Obfuscation** Please advise 

## 2024-02-24 NOTE — Telephone Encounter (Signed)
 I am fine with that, glad it is better Advise use of flutter valve two to three times a day and Albuterol  q6 hours as needed

## 2024-03-07 ENCOUNTER — Other Ambulatory Visit (HOSPITAL_COMMUNITY): Payer: Self-pay

## 2024-03-11 ENCOUNTER — Encounter: Payer: Self-pay | Admitting: Family Medicine

## 2024-03-12 ENCOUNTER — Ambulatory Visit: Payer: Self-pay

## 2024-03-12 NOTE — Telephone Encounter (Signed)
Will discuss at upcoming appt tomorrow °

## 2024-03-12 NOTE — Telephone Encounter (Signed)
 FYI Only or Action Required?: Action required by provider: request for appointment. Today for HA and elevated fasting BS. Blood sugars have been increasing over time. Pt states that Head pian is sharp with cough.  Patient was last seen in primary care on 01/02/2024 by Edman Marsa PARAS, DO.  Called Nurse Triage reporting Headache. And elevated fasting BS  Symptoms began several days ago.  Interventions attempted: OTC medications: Tylenol  - also taking Metformin  as prescribed .  Symptoms are: unchanged.  Triage Disposition: See Physician Within 24 Hours  Patient/caregiver understands and will follow disposition?: Yes                        Copied from CRM #[id number]. Topic: Clinical - Red Word Triage >> Mar 12, 2024  8:15 AM Willma SAUNDERS wrote: Red Word that prompted transfer to Nurse Triage: Patient has had a painful headache since Friday. Blood sugar has also been running high. Took this morning and was 170. Reason for Disposition  [1] MODERATE headache (e.g., interferes with normal activities) AND [2] present > 24 hours AND [3] unexplained  (Exceptions: Pain medicines not tried, typical migraine, or headache part of viral illness.)  Answer Assessment - Initial Assessment Questions 1. LOCATION: Where does it hurt?      Front - also back left side 2. ONSET: When did the headache start? (e.g., minutes, hours, days)      Friday 3. PATTERN: Does the pain come and go, or has it been constant since it started?     Comes and goes 4. SEVERITY: How bad is the pain? and What does it keep you from doing?  (e.g., Scale 1-10; mild, moderate, or severe)     Right now none 5. RECURRENT SYMPTOM: Have you ever had headaches before? If Yes, ask: When was the last time? and What happened that time?      yes 6. CAUSE: What do you think is causing the headache?     Unsure 7. MIGRAINE: Have you been diagnosed with migraine headaches? If Yes, ask: Is this  headache similar?      no 8. HEAD INJURY: Has there been any recent injury to your head?      no  Protocols used: Headache-A-AH

## 2024-03-13 ENCOUNTER — Encounter: Payer: Self-pay | Admitting: Family Medicine

## 2024-03-13 ENCOUNTER — Ambulatory Visit: Admitting: Internal Medicine

## 2024-03-13 ENCOUNTER — Ambulatory Visit: Payer: Self-pay | Admitting: Family Medicine

## 2024-03-13 ENCOUNTER — Ambulatory Visit
Admission: RE | Admit: 2024-03-13 | Discharge: 2024-03-13 | Disposition: A | Discharge status: Home / Self Care | Source: Ambulatory Visit | Attending: Family Medicine | Admitting: Family Medicine

## 2024-03-13 ENCOUNTER — Ambulatory Visit (INDEPENDENT_AMBULATORY_CARE_PROVIDER_SITE_OTHER): Admitting: Family Medicine

## 2024-03-13 VITALS — BP 120/66 | HR 86 | Ht 69.0 in | Wt 165.1 lb

## 2024-03-13 DIAGNOSIS — R9082 White matter disease, unspecified: Secondary | ICD-10-CM | POA: Diagnosis not present

## 2024-03-13 DIAGNOSIS — E1169 Type 2 diabetes mellitus with other specified complication: Secondary | ICD-10-CM | POA: Diagnosis not present

## 2024-03-13 DIAGNOSIS — Z7984 Long term (current) use of oral hypoglycemic drugs: Secondary | ICD-10-CM

## 2024-03-13 DIAGNOSIS — R519 Headache, unspecified: Secondary | ICD-10-CM | POA: Diagnosis not present

## 2024-03-13 DIAGNOSIS — I10 Essential (primary) hypertension: Secondary | ICD-10-CM | POA: Diagnosis not present

## 2024-03-13 NOTE — Progress Notes (Signed)
 Subjective:    Patient ID: Isaac Weber, male    DOB: Dec 16, 1945, 78 y.o.   MRN: [id number]  Isaac ALTLAND is a 78 y.o. male presenting on 03/13/2024 for Headache  Patient presents for a same day appointment.  HPI  Discussed the use of AI scribe software for clinical note transcription with the patient, who gave verbal consent to proceed.  History of Present Illness   [name] is a 78 year old male who presents with a new onset headache   Acute Headache, w L sided pressure - New onset headache began upon awakening on Friday 8/15 - Rarely experiences headaches so this is new - Headache has evolved into a sensation of pressure, predominantly on the left side, rather than pain - Describes the sensation as 'a cloud pushing' rather than a typical headache - Coughing exacerbates the pressure sensation - Unable to sleep despite attempting to rest at times now it is improving. - No numbness, weakness, facial droop, or other stroke-like symptoms - No sinus or respiratory symptoms, congestion, or ear pressure - No history of head injury or trauma  Blood pressure and antihypertensive therapy - Blood pressure readings have varied: 120/80 mmHg at home, 144/58 mmHg in clinic, but improved on repeat. - Currently taking amlodipine  and hydrochlorothiazide  for blood pressure management  Glycemic control and antidiabetic therapy - Takes metformin  for blood sugar control - Blood sugar levels have been fluctuating, with recent readings in the 120s-140s, previously higher - Daughter believes he is taking metformin  incorrectly, they switched to TWICE A DAY dosing  Relevant medical and surgical history - History of pancreatic cysts, uncertain relevance to current symptoms         01/02/2024   10:43 AM 07/04/2023    1:17 PM 04/22/2023    8:57 AM  Depression screen PHQ 2/9  Decreased Interest 0 0 0  Down, Depressed, Hopeless 0 0 0  PHQ - 2 Score 0 0 0  Altered sleeping 0  0   Tired, decreased energy 0  0  Change in appetite 0  0  Feeling bad or failure about yourself  0  0  Trouble concentrating 0  0  Moving slowly or fidgety/restless 0  0  Suicidal thoughts 0  0  PHQ-9 Score 0  0  Difficult doing work/chores Not difficult at all  Not difficult at all       01/02/2024   10:43 AM 07/04/2023    1:18 PM 02/25/2023    9:46 AM 12/23/2022    9:25 AM  GAD 7 : Generalized Anxiety Score  Nervous, Anxious, on Edge 0 0 0 0  Control/stop worrying 0 0 0 0  Worry too much - different things 0 0 0 0  Trouble relaxing 0 0 0 0  Restless 0 0 0 0  Easily annoyed or irritable 0 0 0 0  Afraid - awful might happen 0 0 0 0  Total GAD 7 Score 0 0 0 0  Anxiety Difficulty Not difficult at all  Not difficult at all     Social History   Tobacco Use   Smoking status: Former    Current packs/day: 0.00    Average packs/day: 3.0 packs/day for 26.0 years (78.0 ttl pk-yrs)    Types: Cigarettes    Start date: 09/16/1945    Quit date: 09/17/1971    Years since quitting: 52.5    Passive exposure: Past   Smokeless tobacco: Never  Vaping Use  Vaping status: Never Used  Substance Use Topics   Alcohol use: Yes    Alcohol/week: 2.0 standard drinks of alcohol    Types: 2 Standard drinks or equivalent per week   Drug use: Never    Review of Systems Per HPI unless specifically indicated above     Objective:    BP 120/66 (BP Location: Left Arm, Cuff Size: Normal)   Pulse 86   Ht 5' 9 (1.753 m)   Wt 165 lb 2 oz (74.9 kg)   SpO2 96%   BMI 24.38 kg/m   Wt Readings from Last 3 Encounters:  03/13/24 165 lb 2 oz (74.9 kg)  02/09/24 169 lb 6.4 oz (76.8 kg)  01/12/24 164 lb (74.4 kg)    Physical Exam Vitals and nursing note reviewed.  Constitutional:      General: He is not in acute distress.    Appearance: He is well-developed. He is not diaphoretic.     Comments: Well-appearing, comfortable, cooperative  HENT:     Head: Normocephalic and atraumatic.     Right Ear:  Tympanic membrane, ear canal and external ear normal. There is no impacted cerumen.     Left Ear: Tympanic membrane, ear canal and external ear normal. There is no impacted cerumen.  Eyes:     General:        Right eye: No discharge.        Left eye: No discharge.     Conjunctiva/sclera: Conjunctivae normal.  Neck:     Thyroid : No thyromegaly.  Cardiovascular:     Rate and Rhythm: Normal rate and regular rhythm.     Pulses: Normal pulses.     Heart sounds: Normal heart sounds. No murmur heard. Pulmonary:     Effort: Pulmonary effort is normal. No respiratory distress.     Breath sounds: Normal breath sounds. No wheezing or rales.  Musculoskeletal:        General: Normal range of motion.     Cervical back: Normal range of motion and neck supple.  Lymphadenopathy:     Cervical: No cervical adenopathy.  Skin:    General: Skin is warm and dry.     Findings: No erythema or rash.  Neurological:     General: No focal deficit present.     Mental Status: He is alert and oriented to person, place, and time. Mental status is at baseline.     Sensory: No sensory deficit.     Motor: No weakness.  Psychiatric:        Behavior: Behavior normal.     Comments: Well groomed, good eye contact, normal speech and thoughts    I have personally reviewed the radiology report from 03/13/24 STAT Head CT  EXAM: CT HEAD WITHOUT CONTRAST 03/13/2024 12:28:18 PM   TECHNIQUE: CT of the head was performed without the administration of intravenous contrast. Automated exposure control, iterative reconstruction, and/or weight based adjustment of the mA/kV was utilized to reduce the radiation dose to as low as reasonably achievable.   COMPARISON: 03/09/2018   CLINICAL HISTORY: Headache, new onset (Age >= 51y); Headache, sudden, severe. Headache x 5days no trauma no surg no ca no hx of stroke or sz   FINDINGS:   BRAIN AND VENTRICLES: No acute hemorrhage. Gray-white differentiation is preserved. No  hydrocephalus. No extra-axial collection. No mass effect or midline shift. Age-related cerebral volume loss and mild periventricular white matter disease.   ORBITS: No acute abnormality.   SINUSES: No acute abnormality.   SOFT  TISSUES AND SKULL: No acute soft tissue abnormality. No skull fracture.   VASCULATURE: Calcification within the carotid siphons.   IMPRESSION: 1. No acute intracranial abnormality. 2. Age-related cerebral volume loss and mild periventricular white matter disease.   Electronically signed by: Evalene Coho MD 03/13/2024 12:39 PM EDT RP Workstation: [id number]  Results for orders placed or performed in visit on 01/12/24  Bladder Scan (Post Void Residual) in office   Collection Time: 01/12/24 10:11 AM  Result Value Ref Range   Scan Result       Assessment & Plan:   Problem List Items Addressed This Visit     Essential hypertension   Type 2 diabetes mellitus with other specified complication (HCC)   Other Visit Diagnoses       Acute intractable headache, unspecified headache type    -  Primary   Relevant Orders   CT HEAD WO CONTRAST ( ) (Completed)     Long term current use of oral hypoglycemic drug           Acute Headache with left-sided pressure New headache with left-sided pressure, onset Friday 8/15, gradual improvement which is reassuring No focal neurological deficits. Differential includes blood pressure spike, sinus issues, or reviewed possible but less likely other serious conditions.  - Hemodynamically stable today see below, BP improved on repeat check - No acute infection or sinusitis or ear issue - Cognition is normal - Do not think blood sugar is related or triggering headache - No new med or other obvious trigger - No trauma  - Order CT scan of the head to rule out bleeding, mass, or stroke. - Arrange outpatient imaging at the imaging center. He has been scheduled as work in scan today, to go to [employer] now,  awaiting results.   **Update 03/13/24 1pm Results of Head CT are in, see above, negative for acute, it shows some chronic white matter changes that are age related and no acute abnormality to explain headache, results will be sent to [facility] and called to patient.  Hypertension Home blood pressure readings normal, office readings slightly elevated. Repeat manual reading improved - Continue current antihypertensive medications. No changes at this time.  Type 2 diabetes mellitus Blood sugar fluctuating, recent readings in 140s. Discussed CGM for better monitoring. Provided information on Freestyle Libre CGM. Overall A1c has been controlled in 6 range. Not concerned. His goal is to have tighter sugar control but this seems unrelated to current symptoms, his DM is overall controlled - Provide Freestyle Libre CGM sample for 14 days. - Discuss potential insurance coverage for CGM with pharmacist.         Orders Placed This Encounter  Procedures   CT HEAD WO CONTRAST ( )    Standing Status:   Future    Number of Occurrences:   1    Expiration Date:   03/13/2025    Preferred imaging location?:   [facility]    No orders of the defined types were placed in this encounter.   Follow up plan: Return if symptoms worsen or fail to improve.   Marsa Officer, DO [facility], [facility] 03/13/2024, 11:26 AM

## 2024-03-13 NOTE — Patient Instructions (Addendum)
 Thank you for coming to the office today.  CT STAT image results later today, rule out serious issues such as stroke, bleeding trauma mass or other acute problems.  I don't believe you have these but it is to prove that you don't  [facility] Address: [address] [place], [place] [address] Phone: [phone]  Probably blood pressure related back on Friday may have had a spike in blood pressure  I don't see sign of infection or ear infection at this time.  I don't believe it is blood sugar related. But glad that the sugars are improving.  Can use the Continuous Glucose Monitor Freestyle Libre 1 sensor for free.  Please schedule a Follow-up Appointment to: Return if symptoms worsen or fail to improve.  If you have any other questions or concerns, please feel free to call the office or send a message through MyChart. You may also schedule an earlier appointment if necessary.  Additionally, you may be receiving a survey about your experience at our office within a few days to 1 week by e-mail or mail. We value your feedback.  Marsa Officer, DO [facility], [place]

## 2024-03-25 ENCOUNTER — Other Ambulatory Visit (HOSPITAL_COMMUNITY): Payer: Self-pay

## 2024-04-13 ENCOUNTER — Other Ambulatory Visit (HOSPITAL_COMMUNITY): Payer: Self-pay

## 2024-04-26 ENCOUNTER — Ambulatory Visit: Payer: PPO

## 2024-04-26 VITALS — Ht 69.0 in | Wt 165.0 lb

## 2024-04-26 DIAGNOSIS — Z0000 Encounter for general adult medical examination without abnormal findings: Secondary | ICD-10-CM | POA: Diagnosis not present

## 2024-04-26 NOTE — Patient Instructions (Addendum)
 Isaac Weber,  Thank you for taking the time for your Medicare Wellness Visit. I appreciate your continued commitment to your health goals. Please review the care plan we discussed, and feel free to reach out if I can assist you further.  Medicare recommends these wellness visits once per year to help you and your care team stay ahead of potential health issues. These visits are designed to focus on prevention, allowing your provider to concentrate on managing your acute and chronic conditions during your regular appointments.  Please note that Annual Wellness Visits do not include a physical exam. Some assessments may be limited, especially if the visit was conducted virtually. If needed, we may recommend a separate in-person follow-up with your provider.  Ongoing Care Seeing your primary care provider every 3 to 6 months helps us  monitor your health and provide consistent, personalized care.   Referrals If a referral was made during today's visit and you haven't received any updates within two weeks, please contact the referred provider directly to check on the status.  Recommended Screenings:  Health Maintenance  Topic Date Due   Flu Shot  02/24/2024   Hepatitis C Screening  06/09/2024*   Yearly kidney function blood test for diabetes  06/26/2024   Yearly kidney health urinalysis for diabetes  07/03/2024   Complete foot exam   07/03/2024   Hemoglobin A1C  07/03/2024   Eye exam for diabetics  01/01/2025   Medicare Annual Wellness Visit  04/26/2025   Pneumococcal Vaccine for age over 43  Completed   Zoster (Shingles) Vaccine  Completed   HPV Vaccine  Aged Out   Meningitis B Vaccine  Aged Out   DTaP/Tdap/Td vaccine  Discontinued   Colon Cancer Screening  Discontinued   COVID-19 Vaccine  Discontinued  *Topic was postponed. The date shown is not the original due date.       04/26/2024    1:09 PM  Advanced Directives  Does Patient Have a Medical Advance Directive? Yes  Type of  Estate agent of [place];Living will  Copy of Healthcare Power of Attorney in Chart? No - copy requested   Advance Care Planning is important because it: Ensures you receive medical care that aligns with your values, goals, and preferences. Provides guidance to your family and loved ones, reducing the emotional burden of decision-making during critical moments.  Vision: Annual vision screenings are recommended for early detection of glaucoma, cataracts, and diabetic retinopathy. These exams can also reveal signs of chronic conditions such as diabetes and high blood pressure.  Dental: Annual dental screenings help detect early signs of oral cancer, gum disease, and other conditions linked to overall health, including heart disease and diabetes.  Please see the attached documents for additional preventive care recommendations.

## 2024-04-26 NOTE — Progress Notes (Signed)
 Subjective:   KIING DEAKIN is a 78 y.o. who presents for a Medicare Wellness preventive visit.  As a reminder, Annual Wellness Visits don't include a physical exam, and some assessments may be limited, especially if this visit is performed virtually. We may recommend an in-person follow-up visit with your provider if needed.  Visit Complete: Virtual I connected with  Ozell JULIANNA Pearson on 04/26/24 by a audio enabled telemedicine application and verified that I am speaking with the correct person using two identifiers.  Patient Location: Home  Provider Location: Home Office  I discussed the limitations of evaluation and management by telemedicine. The patient expressed understanding and agreed to proceed.  Vital Signs: Because this visit was a virtual/telehealth visit, some criteria may be missing or patient reported. Any vitals not documented were not able to be obtained and vitals that have been documented are patient reported.    Persons Participating in Visit: Patient.  AWV Questionnaire: No: Patient Medicare AWV questionnaire was not completed prior to this visit.  Cardiac Risk Factors include: advanced age (>44men, >33 women);male gender;diabetes mellitus;hypertension     Objective:    Today's Vitals   04/26/24 1303  Weight: 165 lb (74.8 kg)  Height: 5' 9 (1.753 m)   Body mass index is 24.37 kg/m.     04/26/2024    1:09 PM 05/30/2023    9:57 AM 04/22/2023    9:00 AM 05/12/2022    8:30 AM 05/03/2022    1:18 PM 04/16/2022    9:00 AM 04/14/2022    1:42 PM  Advanced Directives  Does Patient Have a Medical Advance Directive? Yes Yes No Yes Yes No No  Type of Estate agent of [place];Living will Healthcare Power of [place];Living will  Healthcare Power of [place];Living will     Does patient want to make changes to medical advance directive?    No - Patient declined     Copy of Healthcare Power of Attorney in Chart? No - copy requested No - copy  requested  No - copy requested     Would patient like information on creating a medical advance directive?   No - Patient declined   No - Patient declined No - Patient declined    Current Medications (verified) Outpatient Encounter Medications as of 04/26/2024  Medication Sig   albuterol  (VENTOLIN  HFA) 108 (90 Base) MCG/ACT inhaler Inhale 2 puffs into the lungs every 6 (six) hours as needed for wheezing or shortness of breath.   amLODipine  (NORVASC ) 5 MG tablet Take 1 tablet (5 mg total) by mouth daily.   Blood Glucose Monitoring Suppl (ONE TOUCH ULTRA 2) w/Device KIT Use to check blood sugar up to 2 times daily   ferrous sulfate 325 (65 FE) MG tablet Take 325 mg by mouth daily with breakfast.   FIBER COMPLETE PO Take by mouth.   Glucosamine-Chondroitin (GLUCOSAMINE CHONDR COMPLEX PO) Take 1 capsule by mouth 2 (two) times daily.   hydrochlorothiazide  (HYDRODIURIL ) 12.5 MG tablet Take 1 tablet (12.5 mg total) by mouth daily.   ketoconazole  (NIZORAL ) 2 % shampoo Apply to scalp and let sit for 10 minutes then wash off, use 3 times a week.   losartan  (COZAAR ) 50 MG tablet Take 1 tablet (50 mg total) by mouth daily.   metFORMIN  (GLUCOPHAGE -XR) 500 MG 24 hr tablet Take 2 tablets (1,000 mg total) by mouth daily with supper.   Multiple Minerals-Vitamins (CAL MAG ZINC +D3 PO) Take 1 tablet by mouth every evening.  Multiple Vitamin (MULTIVITAMIN WITH MINERALS) TABS tablet Take 1 tablet by mouth daily.   ONETOUCH ULTRA test strip Use to check blood sugar up to twice per day   rosuvastatin  (CRESTOR ) 5 MG tablet Take 1 tablet (5 mg total) by mouth at bedtime.   tamsulosin  (FLOMAX ) 0.4 MG CAPS capsule Take 2 capsules (0.8 mg total) by mouth daily after supper.   No facility-administered encounter medications on file as of 04/26/2024.    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 WD SCC, ulcerated. [facility] 04/29/2020   Past Surgical History:  Procedure Laterality Date   APPLICATION OF INTRAOPERATIVE CT SCAN N/A 05/12/2022   Procedure: APPLICATION OF INTRAOPERATIVE CT SCAN;  Surgeon: Clois Fret, MD;  Location: [facility] ORS;  Service: Neurosurgery;  Laterality: N/A;   BACK SURGERY     BRONCHIAL WASHINGS  11/26/2021   Procedure: BRONCHIAL WASHINGS;  Surgeon: Geronimo Amel, MD;  Location: [facility];  Service: Endoscopy;;   BRONCHIAL WASHINGS  05/30/2023   Procedure: BRONCHIAL WASHINGS;  Surgeon: Geronimo Amel, MD;  Location: [facility];  Service: Endoscopy;;   BUNIONECTOMY Left 2003   hammer toe as well   BUNIONECTOMY WITH HAMMERTOE RECONSTRUCTION Left 2013   repeat   CERVICAL DISC SURGERY     COLONOSCOPY WITH PROPOFOL  N/A 04/30/2021   Procedure: COLONOSCOPY WITH PROPOFOL ;  Surgeon: Therisa Bi, MD;  Location[facility] ENDOSCOPY;  Service: Gastroenterology;  Laterality: N/A;   GREEN LIGHT LASER TURP (TRANSURETHRAL RESECTION OF PROSTATE  2010   laser, shrink prostate   KNEE ARTHROSCOPY Right 1991   LEG SURGERY Left    BENIGN BONE TUMOR   LUMBAR DISC SURGERY  2010   discectomy   LUMBAR DISC SURGERY  2011   LUMBAR LAMINECTOMY/DECOMPRESSION  MICRODISCECTOMY Right 05/12/2022   Procedure: RIGHT L5-S1 DISCECTOMY;  Surgeon: Clois Fret, MD;  Location: [facility] ORS;  Service: Neurosurgery;  Laterality: Right;   PROSTATE SURGERY  2002   shrink prostate   ROTATOR CUFF REPAIR Left 1999   debride, remove bonespur   ROTATOR CUFF REPAIR Right 1996   TONSILLECTOMY     TRANSFORAMINAL LUMBAR INTERBODY FUSION W/ MIS 1 LEVEL Right 05/12/2022   Procedure: RIGHT L4-5 MINIMALLY INVASIVE (MIS) TRANSFORAMINAL LUMBAR INTERBODY FUSION (TLIF);  Surgeon: Clois Fret, MD;  Location: [facility] ORS;  Service: Neurosurgery;  Laterality: Right;   VIDEO BRONCHOSCOPY N/A 11/26/2021   Procedure: VIDEO BRONCHOSCOPY WITHOUT FLUORO;  Surgeon: Geronimo Amel, MD;  Location: [facility];  Service: Endoscopy;  Laterality: N/A;  for chroni ccough BAL   VIDEO BRONCHOSCOPY N/A  05/30/2023   Procedure: VIDEO BRONCHOSCOPY WITHOUT FLUORO;  Surgeon: Geronimo Amel, MD;  Location: [facility];  Service: Endoscopy;  Laterality: N/A;   Family History  Problem Relation Age of Onset   Heart disease Mother 72   Emphysema Father 86   Heart disease Brother 24   Social History   Socioeconomic History   Marital status: Married    Spouse name: Acea Yagi   Number of children: Not on file   Years of education: 12   Highest education level: 12th grade  Occupational History   Occupation: retired  Tobacco Use   Smoking status: Former    Current packs/day: 0.00    Average packs/day: 3.0 packs/day for 26.0 years (78.0 ttl pk-yrs)    Types: Cigarettes    Start date: 09/16/1945    Quit date: 09/17/1971    Years since quitting: 52.6    Passive exposure: Past   Smokeless tobacco: Never  Vaping Use   Vaping status: Never Used  Substance and Sexual Activity   Alcohol use: Yes    Alcohol/week: 2.0 standard drinks of alcohol    Types: 2 Standard drinks or equivalent per week   Drug use: Never   Sexual activity: Yes    Partners: Female  Other Topics Concern    Not on file  Social History Narrative   Not on file   Social Drivers of Health   Financial Resource Strain: Low Risk  (04/26/2024)   Overall Financial Resource Strain (CARDIA)    Difficulty of Paying Living Expenses: Not hard at all  Food Insecurity: No Food Insecurity (04/26/2024)   Hunger Vital Sign    Worried About Running Out of Food in the Last Year: Never true    Ran Out of Food in the Last Year: Never true  Transportation Needs: No Transportation Needs (04/26/2024)   PRAPARE - Administrator, Civil Service (Medical): No    Lack of Transportation (Non-Medical): No  Physical Activity: Sufficiently Active (04/26/2024)   Exercise Vital Sign    Days of Exercise per Week: 7 days    Minutes of Exercise per Session: 30 min  Recent Concern: Physical Activity - Insufficiently Active (03/12/2024)   Exercise Vital Sign    Days of Exercise per Week: 3 days    Minutes of Exercise per Session: 30 min  Stress: No Stress Concern Present (04/26/2024)   [employer] of Occupational Health - Occupational Stress Questionnaire    Feeling of Stress: Not at all  Social Connections: Socially Integrated (04/26/2024)   Social Connection and Isolation Panel    Frequency of Communication with Friends and Family: More than three times a week    Frequency of Social Gatherings with Friends and Family: More than three times a week    Attends Religious Services: More than 4 times per year    Active Member of [employer] or Organizations: Yes    Attends Engineer, structural: More than 4 times per year    Marital Status: Married    Tobacco Counseling Counseling given: Not Answered    Clinical Intake:  Pre-visit preparation completed: Yes  Pain : No/denies pain     BMI - recorded: 24.37 Nutritional Status: BMI of 19-24  Normal Nutritional Risks: None Diabetes: Yes CBG done?: No Did pt. bring in CBG monitor from home?: No  Lab Results  Component Value Date   HGBA1C 6.3 (A)  01/02/2024   HGBA1C 6.5 (H) 06/27/2023   HGBA1C 7.5 (H) 12/16/2022  How often do you need to have someone help you when you read instructions, pamphlets, or other written materials from your doctor or pharmacy?: 1 - Never  Interpreter Needed?: No  Information entered by :: Rojelio Blush LPN   Activities of Daily Living     04/26/2024    1:08 PM  In your present state of health, do you have any difficulty performing the following activities:  Hearing? 0  Vision? 0  Difficulty concentrating or making decisions? 0  Walking or climbing stairs? 0  Dressing or bathing? 0  Doing errands, shopping? 0  Preparing Food and eating ? N  Using the Toilet? N  In the past six months, have you accidently leaked urine? N  Do you have problems with loss of bowel control? N  Managing your Medications? N  Managing your Finances? N  Housekeeping or managing your Housekeeping? N    Patient Care Team: Edman Marsa PARAS, DO as PCP - General (Family Medicine) Darliss Rogue, MD as PCP - Cardiology (Cardiology) Geronimo Amel, MD as Consulting Physician (Pulmonary Disease)  I have updated your Care Teams any recent Medical Services you may have received from other providers in the past year.     Assessment:   This is a routine wellness examination for Noha.  Hearing/Vision screen Hearing Screening - Comments:: Denies hearing difficulties   Vision Screening - Comments:: Wears rx glasses - up to date with routine eye exams with  Dr Debarah   Goals Addressed               This Visit's Progress     Continue physical activity (pt-stated)         Depression Screen     04/26/2024    1:07 PM 01/02/2024   10:43 AM 07/04/2023    1:17 PM 04/22/2023    8:57 AM 02/25/2023    9:46 AM 12/23/2022    9:25 AM 06/24/2022    9:05 AM  PHQ 2/9 Scores  PHQ - 2 Score 0 0 0 0 0 0 0  PHQ- 9 Score  0  0 0  0    Fall Risk     04/26/2024    1:08 PM 02/09/2024    9:17 AM 01/02/2024   10:43  AM 07/04/2023    1:17 PM 04/22/2023    9:00 AM  Fall Risk   Falls in the past year? 0 0 0 0 0  Number falls in past yr: 0    0  Injury with Fall? 0    0  Risk for fall due to : No Fall Risks    No Fall Risks  Follow up Falls evaluation completed    Falls prevention discussed;Falls evaluation completed    MEDICARE RISK AT HOME:  Medicare Risk at Home Any stairs in or around the home?: No If so, are there any without handrails?: No Home free of loose throw rugs in walkways, pet beds, electrical cords, etc?: Yes Adequate lighting in your home to reduce risk of falls?: Yes Life alert?: No Use of a cane, walker or w/c?: No Grab bars in the bathroom?: Yes Shower chair or bench in shower?: Yes Elevated toilet seat or a handicapped toilet?: Yes  TIMED UP AND GO:  Was the test performed?  No  Cognitive Function: 6CIT completed        04/26/2024    1:09 PM 04/22/2023    9:02 AM 04/16/2022    9:09 AM 04/07/2021    2:09 PM  03/18/2020    2:05 PM  6CIT Screen  What Year? 0 points 0 points 0 points 0 points 0 points  What month? 0 points 0 points 0 points 0 points 0 points  What time? 0 points 0 points 0 points 0 points 0 points  Count back from 20 0 points 0 points 0 points 0 points 0 points  Months in reverse 0 points 0 points 0 points 0 points 0 points  Repeat phrase 0 points 0 points 0 points 4 points 4 points  Total Score 0 points 0 points 0 points 4 points 4 points    Immunizations Immunization History  Administered Date(s) Administered   Fluad Quad(high Dose 65+) 05/13/2019, 06/09/2020, 04/21/2021, 04/22/2022   Fluad Trivalent(High Dose 65+) 04/21/2023   INFLUENZA, HIGH DOSE SEASONAL PF 07/15/2017, 04/12/2018   PFIZER(Purple Top)SARS-COV-2 Vaccination 02/13/2020, 03/05/2020   PNEUMOCOCCAL CONJUGATE-20 07/04/2023   Pneumococcal Conjugate-13 01/28/2014   Pneumococcal Polysaccharide-23 03/04/2008, 08/26/2020   Tdap 07/12/2011   Zoster Recombinant(Shingrix) 04/12/2017,  08/17/2017    Screening Tests Health Maintenance  Topic Date Due   Influenza Vaccine  02/24/2024   Hepatitis C Screening  06/09/2024 (Originally 02/23/1964)   Diabetic kidney evaluation - eGFR measurement  06/26/2024   Diabetic kidney evaluation - Urine ACR  07/03/2024   FOOT EXAM  07/03/2024   HEMOGLOBIN A1C  07/03/2024   OPHTHALMOLOGY EXAM  01/01/2025   Medicare Annual Wellness (AWV)  04/26/2025   Pneumococcal Vaccine: 50+ Years  Completed   Zoster Vaccines- Shingrix  Completed   HPV VACCINES  Aged Out   Meningococcal B Vaccine  Aged Out   DTaP/Tdap/Td  Discontinued   Colonoscopy  Discontinued   COVID-19 Vaccine  Discontinued    Health Maintenance Items Addressed:   Additional Screening:  Vision Screening: Recommended annual ophthalmology exams for early detection of glaucoma and other disorders of the eye. Is the patient up to date with their annual eye exam?  Yes  Who is the provider or what is the name of the office in which the patient attends annual eye exams? Dr Debarah  Dental Screening: Recommended annual dental exams for proper oral hygiene  Community Resource Referral / Chronic Care Management: CRR required this visit?  No   CCM required this visit?  No   Plan:    I have personally reviewed and noted the following in the patient's chart:   Medical and social history Use of alcohol, tobacco or illicit drugs  Current medications and supplements including opioid prescriptions. Patient is not currently taking opioid prescriptions. Functional ability and status Nutritional status Physical activity Advanced directives List of other physicians Hospitalizations, surgeries, and ER visits in previous 12 months Vitals Screenings to include cognitive, depression, and falls Referrals and appointments  In addition, I have reviewed and discussed with patient certain preventive protocols, quality metrics, and best practice recommendations. A written personalized care  plan for preventive services as well as general preventive health recommendations were provided to patient.   Rojelio LELON Blush, LPN   [id number]/01/[id number]   After Visit Summary: (MyChart) Due to this being a telephonic visit, the after visit summary with patients personalized plan was offered to patient via MyChart   Notes: Nothing significant to report at this time.

## 2024-05-09 ENCOUNTER — Other Ambulatory Visit: Payer: Self-pay

## 2024-05-10 ENCOUNTER — Ambulatory Visit (INDEPENDENT_AMBULATORY_CARE_PROVIDER_SITE_OTHER)

## 2024-05-10 DIAGNOSIS — Z23 Encounter for immunization: Secondary | ICD-10-CM | POA: Diagnosis not present

## 2024-06-02 ENCOUNTER — Other Ambulatory Visit (HOSPITAL_COMMUNITY): Payer: Self-pay

## 2024-06-04 ENCOUNTER — Other Ambulatory Visit: Payer: Self-pay

## 2024-06-19 ENCOUNTER — Other Ambulatory Visit: Payer: Self-pay | Admitting: Family Medicine

## 2024-06-19 DIAGNOSIS — E1169 Type 2 diabetes mellitus with other specified complication: Secondary | ICD-10-CM

## 2024-06-22 ENCOUNTER — Other Ambulatory Visit (HOSPITAL_BASED_OUTPATIENT_CLINIC_OR_DEPARTMENT_OTHER): Payer: Self-pay

## 2024-06-22 ENCOUNTER — Other Ambulatory Visit (HOSPITAL_COMMUNITY): Payer: Self-pay

## 2024-06-22 MED ORDER — METFORMIN HCL ER 500 MG PO TB24
1000.0000 mg | ORAL_TABLET | Freq: Every day | ORAL | 0 refills | Status: AC
Start: 2024-06-22 — End: ?
  Filled 2024-06-22: qty 180, 90d supply, fill #0

## 2024-06-22 NOTE — Telephone Encounter (Signed)
 Requested Prescriptions  Pending Prescriptions Disp Refills   metFORMIN  (GLUCOPHAGE -XR) 500 MG 24 hr tablet 180 tablet 0    Sig: Take 2 tablets (1,000 mg total) by mouth daily with supper.     Endocrinology:  Diabetes - Biguanides Failed - 06/22/2024 11:55 AM      Failed - Cr in normal range and within 360 days    Creatinine  Date Value Ref Range Status  09/16/2014 0.9 0.7 - 1.3 mg/dL Final   Creat  Date Value Ref Range Status  06/27/2023 0.78 0.70 - 1.28 mg/dL Final   Creatinine, Urine  Date Value Ref Range Status  07/04/2023 82 20 - 320 mg/dL Final         Failed - eGFR in normal range and within 360 days    GFR, Est African American  Date Value Ref Range Status  06/02/2020 102 > OR = 60 mL/min/1.8m2 Final   GFR, Est Non African American  Date Value Ref Range Status  06/02/2020 88 > OR = 60 mL/min/1.10m2 Final   GFR, Estimated  Date Value Ref Range Status  05/14/2022 >60 >60 mL/min Final    Comment:    (NOTE) Calculated using the CKD-EPI Creatinine Equation (2021)    GFR  Date Value Ref Range Status  01/15/2022 81.65 >60.00 mL/min Final    Comment:    Calculated using the CKD-EPI Creatinine Equation (2021)   eGFR  Date Value Ref Range Status  06/27/2023 91 > OR = 60 mL/min/1.68m2 Final         Failed - B12 Level in normal range and within 720 days    Vitamin B-12  Date Value Ref Range Status  09/16/2014 1,101 (H) 211 - 911 pg/mL Final         Failed - CBC within normal limits and completed in the last 12 months    WBC  Date Value Ref Range Status  06/27/2023 6.1 3.8 - 10.8 Thousand/uL Final   RBC  Date Value Ref Range Status  06/27/2023 4.05 (L) 4.20 - 5.80 Million/uL Final   Hemoglobin  Date Value Ref Range Status  06/27/2023 13.0 (L) 13.2 - 17.1 g/dL Final   HGB  Date Value Ref Range Status  09/16/2014 11.6 (L) 13.0 - 17.1 g/dL Final   HCT  Date Value Ref Range Status  06/27/2023 36.6 (L) 38.5 - 50.0 % Final  09/16/2014 33.6 (L) 38.4 -  49.9 % Final   MCHC  Date Value Ref Range Status  06/27/2023 35.5 32.0 - 36.0 g/dL Final    Comment:    For adults, a slight decrease in the calculated MCHC value (in the range of 30 to 32 g/dL) is most likely not clinically significant; however, it should be interpreted with caution in correlation with other red cell parameters and the patient's clinical condition.    [facility]  Date Value Ref Range Status  06/27/2023 32.1 27.0 - 33.0 pg Final   MCV  Date Value Ref Range Status  06/27/2023 90.4 80.0 - 100.0 fL Final  09/16/2014 81.4 79.3 - 98.0 fL Final   No results found for: PLTCOUNTKUC, LABPLAT, POCPLA RDW  Date Value Ref Range Status  06/27/2023 12.9 11.0 - 15.0 % Final  09/16/2014 13.9 11.0 - 14.6 % Final         Passed - HBA1C is between 0 and 7.9 and within 180 days    Hemoglobin A1C  Date Value Ref Range Status  01/02/2024 6.3 (A) 4.0 - 5.6 % Final  Hgb A1c MFr Bld  Date Value Ref Range Status  06/27/2023 6.5 (H) <5.7 % of total Hgb Final    Comment:    For someone without known diabetes, a hemoglobin A1c value of 6.5% or greater indicates that they may have  diabetes and this should be confirmed with a follow-up  test. . For someone with known diabetes, a value <7% indicates  that their diabetes is well controlled and a value  greater than or equal to 7% indicates suboptimal  control. A1c targets should be individualized based on  duration of diabetes, age, comorbid conditions, and  other considerations. . Currently, no consensus exists regarding use of hemoglobin A1c for diagnosis of diabetes for children. SABRA Amy - Valid encounter within last 6 months    Recent Outpatient Visits           3 months ago Acute intractable headache, unspecified headache type   [place] [facility] [place], Marsa PARAS, DO   5 months ago Type 2 diabetes mellitus with other specified complication, without long-term current use of  insulin  [facility])   [place] [facility] [place], Marsa PARAS, DO   5 months ago Spondylosis of lumbosacral region without myelopathy or radiculopathy   [facility], Selinda PARAS, MD       Future Appointments             In 6 months Francisca Redell BROCKS, MD [facility] [place]   In 7 months Hester Alm BROCKS, MD [facility]

## 2024-07-04 ENCOUNTER — Other Ambulatory Visit

## 2024-07-04 ENCOUNTER — Encounter: Payer: Self-pay | Admitting: Family Medicine

## 2024-07-04 DIAGNOSIS — N401 Enlarged prostate with lower urinary tract symptoms: Secondary | ICD-10-CM

## 2024-07-04 DIAGNOSIS — I10 Essential (primary) hypertension: Secondary | ICD-10-CM

## 2024-07-04 DIAGNOSIS — Z0000 Encounter for general adult medical examination without abnormal findings: Secondary | ICD-10-CM

## 2024-07-04 DIAGNOSIS — E1169 Type 2 diabetes mellitus with other specified complication: Secondary | ICD-10-CM

## 2024-07-06 ENCOUNTER — Other Ambulatory Visit

## 2024-07-07 LAB — CBC WITH DIFFERENTIAL/PLATELET
Absolute Lymphocytes: 1434 {cells}/uL (ref 850–3900)
Absolute Monocytes: 529 {cells}/uL (ref 200–950)
Basophils Absolute: 47 {cells}/uL (ref 0–200)
Basophils Relative: 0.7 %
Eosinophils Absolute: 389 {cells}/uL (ref 15–500)
Eosinophils Relative: 5.8 %
HCT: 34.8 % — ABNORMAL LOW (ref 39.4–51.1)
Hemoglobin: 12.2 g/dL — ABNORMAL LOW (ref 13.2–17.1)
MCH: 32 pg (ref 27.0–33.0)
MCHC: 35.1 g/dL (ref 31.6–35.4)
MCV: 91.3 fL (ref 81.4–101.7)
MPV: 9.9 fL (ref 7.5–12.5)
Monocytes Relative: 7.9 %
Neutro Abs: 4301 {cells}/uL (ref 1500–7800)
Neutrophils Relative %: 64.2 %
Platelets: 191 Thousand/uL (ref 140–400)
RBC: 3.81 Million/uL — ABNORMAL LOW (ref 4.20–5.80)
RDW: 12.6 % (ref 11.0–15.0)
Total Lymphocyte: 21.4 %
WBC: 6.7 Thousand/uL (ref 3.8–10.8)

## 2024-07-07 LAB — COMPREHENSIVE METABOLIC PANEL WITH GFR
AG Ratio: 1.9 (calc) (ref 1.0–2.5)
ALT: 14 U/L (ref 9–46)
AST: 16 U/L (ref 10–35)
Albumin: 3.9 g/dL (ref 3.6–5.1)
Alkaline phosphatase (APISO): 74 U/L (ref 35–144)
BUN: 16 mg/dL (ref 7–25)
CO2: 27 mmol/L (ref 20–32)
Calcium: 9.2 mg/dL (ref 8.6–10.3)
Chloride: 101 mmol/L (ref 98–110)
Creat: 0.78 mg/dL (ref 0.70–1.28)
Globulin: 2.1 g/dL (ref 1.9–3.7)
Glucose, Bld: 124 mg/dL — ABNORMAL HIGH (ref 65–99)
Potassium: 3.9 mmol/L (ref 3.5–5.3)
Sodium: 135 mmol/L (ref 135–146)
Total Bilirubin: 0.7 mg/dL (ref 0.2–1.2)
Total Protein: 6 g/dL — ABNORMAL LOW (ref 6.1–8.1)
eGFR: 91 mL/min/1.73m2 (ref >=60)

## 2024-07-07 LAB — LIPID PANEL
Cholesterol: 111 mg/dL (ref <200)
HDL: 56 mg/dL (ref >=40)
LDL Cholesterol (Calc): 39 mg/dL
Non-HDL Cholesterol (Calc): 55 mg/dL (ref <130)
Total CHOL/HDL Ratio: 2 (calc) (ref <5.0)
Triglycerides: 75 mg/dL (ref <150)

## 2024-07-07 LAB — PSA: PSA: 3.12 ng/mL (ref <=4.00)

## 2024-07-07 LAB — HEMOGLOBIN A1C
Hgb A1c MFr Bld: 6.1 % — ABNORMAL HIGH (ref <5.7)
Mean Plasma Glucose: 128 mg/dL
eAG (mmol/L): 7.1 mmol/L

## 2024-07-07 LAB — TSH: TSH: 2.05 m[IU]/L (ref 0.40–4.50)

## 2024-07-09 ENCOUNTER — Ambulatory Visit: Admitting: Family Medicine

## 2024-07-09 ENCOUNTER — Encounter: Payer: Self-pay | Admitting: Family Medicine

## 2024-07-09 ENCOUNTER — Other Ambulatory Visit: Payer: Self-pay

## 2024-07-09 VITALS — BP 120/58 | HR 88 | Ht 69.0 in | Wt 168.5 lb

## 2024-07-09 DIAGNOSIS — N401 Enlarged prostate with lower urinary tract symptoms: Secondary | ICD-10-CM | POA: Diagnosis not present

## 2024-07-09 DIAGNOSIS — Z0000 Encounter for general adult medical examination without abnormal findings: Secondary | ICD-10-CM

## 2024-07-09 DIAGNOSIS — Z7984 Long term (current) use of oral hypoglycemic drugs: Secondary | ICD-10-CM | POA: Diagnosis not present

## 2024-07-09 DIAGNOSIS — E1169 Type 2 diabetes mellitus with other specified complication: Secondary | ICD-10-CM | POA: Diagnosis not present

## 2024-07-09 DIAGNOSIS — E785 Hyperlipidemia, unspecified: Secondary | ICD-10-CM | POA: Diagnosis not present

## 2024-07-09 DIAGNOSIS — I10 Essential (primary) hypertension: Secondary | ICD-10-CM | POA: Diagnosis not present

## 2024-07-09 DIAGNOSIS — R351 Nocturia: Secondary | ICD-10-CM | POA: Diagnosis not present

## 2024-07-09 DIAGNOSIS — D649 Anemia, unspecified: Secondary | ICD-10-CM | POA: Diagnosis not present

## 2024-07-09 MED ORDER — HYDROCHLOROTHIAZIDE 12.5 MG PO TABS
12.5000 mg | ORAL_TABLET | Freq: Every day | ORAL | 3 refills | Status: AC
  Filled 2024-07-09: qty 90, 90d supply, fill #0

## 2024-07-09 MED ORDER — METFORMIN HCL ER 500 MG PO TB24
1000.0000 mg | ORAL_TABLET | Freq: Every day | ORAL | 3 refills | Status: AC
  Filled 2024-07-09: qty 180, 90d supply, fill #0

## 2024-07-09 MED ORDER — ROSUVASTATIN CALCIUM 5 MG PO TABS
5.0000 mg | ORAL_TABLET | Freq: Every day | ORAL | 3 refills | Status: AC
  Filled 2024-07-09 – 2024-08-07 (×2): qty 90, 90d supply, fill #0

## 2024-07-09 MED ORDER — AMLODIPINE BESYLATE 5 MG PO TABS
5.0000 mg | ORAL_TABLET | Freq: Every day | ORAL | 3 refills | Status: AC
  Filled 2024-07-09: qty 90, 90d supply, fill #0

## 2024-07-09 MED ORDER — LOSARTAN POTASSIUM 50 MG PO TABS
50.0000 mg | ORAL_TABLET | Freq: Every day | ORAL | 3 refills | Status: AC
  Filled 2024-07-09 – 2024-08-07 (×2): qty 90, 90d supply, fill #0

## 2024-07-09 NOTE — Progress Notes (Unsigned)
 Subjective:    Patient ID: Isaac Weber, male    DOB: 1946-02-18, 78 y.o.   MRN: [id number]  Isaac Weber is a 78 y.o. male presenting on 07/09/2024 for Annual Exam   HPI  Discussed the use of AI scribe software for clinical note transcription with the patient, who gave verbal consent to proceed.  History of Present Illness   ***LDL down from 59 > 39 lbs ***A1c down to 6.1 improved from 6.3-6.5 *** Hgb 12.2, slight reduction, previous range 12-13, on ferrous sulfate 325mg  daily and donating every 8 weeks  *** Transient elevation of HR possible trigger ***  He experiences occasional sharp ear pain, particularly when tugging at his ear. He does not believe he has a wax problem and describes the pain as 'really sharp.' No frequent ear wax buildup.   He is currently experiencing hip pain, for which he is taking prednisone . He is on his third dose and reports some improvement, although the pain persists. He has a history of sacroiliac joint issues, which have been treated previously but did not respond to recent interventions. He is under the care of a sports doctor for this condition.   He is up to date with his pneumonia and shingles vaccinations, foot and urine checks, and had a colonoscopy in October 2022.       CHRONIC DM, Type 2 Hyperlipidemia ASCVD Risk LDL controlled. He remains on Rosuvastatin  5mg  daily for cardiovascular prevention. His recent A1c level is 6.3, with previous A1c levels over the past six months ranging from 6.3 to 7.0, indicating well-controlled diabetes. Meds: Metformin  XR 500mg  x2 = 1000mg  daily at bedtime, tolerating well OneTouch Ultra 2 test supplies  Reports good compliance. Tolerating well w/o side-effects Currently on ARB, Statin Followed by Dr Milissa [facility] - apt later today 01/02/24 Denies hypoglycemia, polyuria, visual changes, numbness or tingling.   ASCVD Risk LDL controlled. He remains on Rosuvastatin  5mg  daily for  cardiovascular prevention.   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 He gets BP checked every 8 weeks with donating blood. K has been stable on labs Current Meds - Losartan  50mg  and HCTZ 12.5mg  daily, Amlodipine  5mg  daily Reports good compliance, took meds today. Tolerating well, w/o complaints. Denies CP, dyspnea, HA, edema, dizziness / lightheadedness   History of Iron Deficiency Donates blood every 8 weeks. He takes iron supplement.   Sacroiliac /Lumbar Pain / Osteoarthritis, bilateral knees, lumbar spine Chronic problem, multiple joints, episodic pain and flares He has has seen chiropractor as well. following with Sports Med Dr Alvia, on steroid course Previously chiropractor helping now not anymore   Prior history PSA with improvement from 4.06 down to 3.85, next yearly visit Dr Francisca CHERRY 12/2022, he may resolve the follow up and just check lab here  ***PSA 3.12, improved from 3.31    PMH Bilateral Rotator Cuff Impingement / Shoulder Pain     Last Colonoscopy 04/30/21 - polyps, repeat 3 years in 2025 if interested. He declines today  Health Maintenance:  UTD Flu, Prevnar-20, Shingles completed     04/26/2024    1:07 PM 01/02/2024   10:43 AM 07/04/2023    1:17 PM  Depression screen PHQ 2/9  Decreased Interest 0 0 0  Down, Depressed, Hopeless 0 0 0  PHQ - 2 Score 0 0 0  Altered sleeping  0  Tired, decreased energy  0   Change in appetite  0   Feeling bad or failure about yourself   0   Trouble concentrating  0   Moving slowly or fidgety/restless  0   Suicidal thoughts  0   PHQ-9 Score  0    Difficult doing work/chores  Not difficult at all      Data saved with a previous flowsheet row definition       01/02/2024   10:43 AM 07/04/2023    1:18 PM 02/25/2023    9:46 AM 12/23/2022    9:25 AM  GAD 7 : Generalized Anxiety Score   Nervous, Anxious, on Edge 0 0 0 0  Control/stop worrying 0 0 0 0  Worry too much - different things 0 0 0 0  Trouble relaxing 0 0 0 0  Restless 0 0 0 0  Easily annoyed or irritable 0 0 0 0  Afraid - awful might happen 0 0 0 0  Total GAD 7 Score 0 0 0 0  Anxiety Difficulty Not difficult at all  Not difficult at all      Past Medical History:  Diagnosis Date   Abnormal chest CT    Actinic keratosis    Allergic rhinitis, unspecified    Anemia    Aortic atherosclerosis    Arthritis    BPH (benign prostatic hyperplasia)    COPD (chronic obstructive pulmonary disease) (HCC)    Coronary artery disease    Cough variant asthma    Diabetes mellitus without complication (HCC)    Glaucoma    Hypertension    Other chronic pain    Pancreatic lesion    Pneumonia    Pure hypercholesterolemia, unspecified    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 WD SCC, ulcerated. [facility] 04/29/2020   Past Surgical History:  Procedure Laterality Date   APPLICATION OF INTRAOPERATIVE CT SCAN N/A 05/12/2022   Procedure: APPLICATION OF INTRAOPERATIVE CT SCAN;  Surgeon: Clois Fret, MD;  Location: [facility] ORS;  Service: Neurosurgery;  Laterality: N/A;   BACK SURGERY     BRONCHIAL WASHINGS  11/26/2021   Procedure: BRONCHIAL WASHINGS;  Surgeon: Geronimo Amel, MD;  Location: [facility];  Service: Endoscopy;;   BRONCHIAL WASHINGS  05/30/2023   Procedure: BRONCHIAL WASHINGS;  Surgeon: Geronimo Amel, MD;  Location: [facility];  Service: Endoscopy;;   BUNIONECTOMY Left 2003   hammer toe as well   BUNIONECTOMY WITH HAMMERTOE RECONSTRUCTION Left 2013   repeat   CERVICAL DISC SURGERY     COLONOSCOPY WITH PROPOFOL  N/A  04/30/2021   Procedure: COLONOSCOPY WITH PROPOFOL ;  Surgeon: Therisa Bi, MD;  Location: [facility];  Service: Gastroenterology;  Laterality: N/A;   GREEN LIGHT LASER TURP (TRANSURETHRAL RESECTION OF PROSTATE  2010   laser, shrink prostate   KNEE ARTHROSCOPY Right 1991   LEG SURGERY Left    BENIGN BONE TUMOR   LUMBAR DISC SURGERY  2010   discectomy   LUMBAR DISC SURGERY  2011   LUMBAR LAMINECTOMY/DECOMPRESSION MICRODISCECTOMY Right 05/12/2022   Procedure: RIGHT L5-S1 DISCECTOMY;  Surgeon: Clois Fret, MD;  Location: [facility];  Service: Neurosurgery;  Laterality: Right;   PROSTATE SURGERY  2002  shrink prostate   ROTATOR CUFF REPAIR Left 1999   debride, remove bonespur   ROTATOR CUFF REPAIR Right 1996   TONSILLECTOMY     TRANSFORAMINAL LUMBAR INTERBODY FUSION W/ MIS 1 LEVEL Right 05/12/2022   Procedure: RIGHT L4-5 MINIMALLY INVASIVE (MIS) TRANSFORAMINAL LUMBAR INTERBODY FUSION (TLIF);  Surgeon: Clois Fret, MD;  Location: [facility] OR[facility];  Service: Neurosurgery;  Laterality: Right;   VIDEO BRONCHOSCOPY N/A 11/26/2021   Procedure: VIDEO BRONCHOSCOPY WITHOUT FLUORO;  Surgeon: Geronimo Amel, MD;  Location: [facility];  Service: Endoscopy;  Laterality: N/A;  for chroni ccough BAL   VIDEO BRONCHOSCOPY N/A 05/30/2023   Procedure: VIDEO BRONCHOSCOPY WITHOUT FLUORO;  Surgeon: Geronimo Amel, MD;  Location: [facility];  Service: Endoscopy;  Laterality: N/A;   Social History   Socioeconomic History   Marital status: Married    Spouse name: Patrich Heinze   Number of children: Not on file   Years of education: 12   Highest education level: 12th grade  Occupational History   Occupation: retired  Tobacco Use   Smoking status: Former    Current packs/day: 0.00    Average packs/day: 3.0 packs/day for 26.0 years (78.0 ttl pk-yrs)    Types: Cigarettes    Start date: 09/16/1945    Quit date: 09/17/1971    Years since quitting: 52.8    Passive exposure: Past   Smokeless  tobacco: Never  Vaping Use   Vaping status: Never Used  Substance and Sexual Activity   Alcohol use: Yes    Alcohol/week: 2.0 standard drinks of alcohol    Types: 2 Standard drinks or equivalent per week   Drug use: Never   Sexual activity: Yes    Partners: Female  Other Topics Concern   Not on file  Social History Narrative   Not on file   Social Drivers of Health   Tobacco Use: Medium Risk (07/09/2024)   Patient History    Smoking Tobacco Use: Former    Smokeless Tobacco Use: Never    Passive Exposure: Past  Physicist, Medical Strain: Low Risk (07/05/2024)   Overall Financial Resource Strain (CARDIA)    Difficulty of Paying Living Expenses: Not hard at all  Food Insecurity: No Food Insecurity (07/05/2024)   Epic    Worried About Programme Researcher, Broadcasting/film/video in the Last Year: Never true    Ran Out of Food in the Last Year: Never true  Transportation Needs: No Transportation Needs (07/05/2024)   Epic    Lack of Transportation (Medical): No    Lack of Transportation (Non-Medical): No  Physical Activity: Sufficiently Active (04/26/2024)   Exercise Vital Sign    Days of Exercise per Week: 7 days    Minutes of Exercise per Session: 30 min  Recent Concern: Physical Activity - Insufficiently Active (03/12/2024)   Exercise Vital Sign    Days of Exercise per Week: 3 days    Minutes of Exercise per Session: 30 min  Stress: No Stress Concern Present (04/26/2024)   [employer] of Occupational Health - Occupational Stress Questionnaire    Feeling of Stress: Not at all  Social Connections: Socially Integrated (07/05/2024)   Social Connection and Isolation Panel    Frequency of Communication with Friends and Family: Three times a week    Frequency of Social Gatherings with Friends and Family: Once a week    Attends Religious Services: More than 4 times per year    Active Member of [employer] or Organizations: Yes    Attends Banker Meetings:  More than 4 times per year     Marital Status: Married  Catering Manager Violence: Not At Risk (04/26/2024)   Epic    Fear of Current or Ex-Partner: No    Emotionally Abused: No    Physically Abused: No    Sexually Abused: No  Depression (PHQ2-9): Low Risk (04/26/2024)   Depression (PHQ2-9)    PHQ-2 Score: 0  Alcohol Screen: Low Risk (07/05/2024)   Alcohol Screen    Last Alcohol Screening Score (AUDIT): 1  Housing: Low Risk (07/05/2024)   Epic    Unable to Pay for Housing in the Last Year: No    Number of Times Moved in the Last Year: 0    Homeless in the Last Year: No  Utilities: Not At Risk (04/26/2024)   Epic    Threatened with loss of utilities: No  Health Literacy: Adequate Health Literacy (04/26/2024)   B1300 Health Literacy    Frequency of need for help with medical instructions: Never   Family History  Problem Relation Age of Onset   Heart disease Mother 11   Emphysema Father 60   Heart disease Brother 35   Current Outpatient Medications on File Prior to Visit  Medication Sig   albuterol  (VENTOLIN  HFA) 108 (90 Base) MCG/ACT inhaler Inhale 2 puffs into the lungs every 6 (six) hours as needed for wheezing or shortness of breath.   Blood Glucose Monitoring Suppl (ONE TOUCH ULTRA 2) w/Device KIT Use to check blood sugar up to 2 times daily   ferrous sulfate 325 (65 FE) MG tablet Take 325 mg by mouth daily with breakfast.   FIBER COMPLETE PO Take by mouth.   Glucosamine-Chondroitin (GLUCOSAMINE CHONDR COMPLEX PO) Take 1 capsule by mouth 2 (two) times daily.   ketoconazole  (NIZORAL ) 2 % shampoo Apply to scalp and let sit for 10 minutes then wash off, use 3 times a week.   Multiple Minerals-Vitamins (CAL MAG ZINC +D3 PO) Take 1 tablet by mouth every evening.   Multiple Vitamin (MULTIVITAMIN WITH MINERALS) TABS tablet Take 1 tablet by mouth daily.   ONETOUCH ULTRA test strip Use to check blood sugar up to twice per day   tamsulosin  (FLOMAX ) 0.4 MG CAPS capsule Take 2 capsules (0.8 mg total) by mouth daily  after supper.   No current facility-administered medications on file prior to visit.    Review of Systems Per HPI unless specifically indicated above     Objective:    BP (!) 120/58 (BP Location: Left Arm, Patient Position: Sitting, Cuff Size: Normal)   Pulse 88   Ht 5' 9 (1.753 m)   Wt 168 lb 8 oz (76.4 kg)   SpO2 99%   BMI 24.88 kg/m   Wt Readings from Last 3 Encounters:  07/09/24 168 lb 8 oz (76.4 kg)  04/26/24 165 lb (74.8 kg)  03/13/24 165 lb 2 oz (74.9 kg)    Physical Exam  Diabetic Foot Exam - Simple   Simple Foot Form Diabetic Foot exam was performed with the following findings: Yes 07/09/2024 10:09 AM  Visual Inspection See comments: Yes Sensation Testing Intact to touch and monofilament testing bilaterally: Yes Pulse Check Posterior Tibialis and Dorsalis pulse intact bilaterally: Yes Comments Mild callus formation Left forefoot and toes, with some hammer toe deformity, intact sensation to monofilament.      Results for orders placed or performed in visit on 07/04/24  Comprehensive metabolic panel with GFR   Collection Time: 07/06/24  8:15 AM  Result Value Ref  Range   Glucose, Bld 124 (H) 65 - 99 mg/dL   BUN 16 7 - 25 mg/dL   Creat 9.21 9.29 - 8.71 mg/dL   eGFR 91 > OR = 60 fO/fpw/8.26f7   BUN/Creatinine Ratio SEE NOTE: 6 - 22 (calc)   Sodium 135 135 - 146 mmol/L   Potassium 3.9 3.5 - 5.3 mmol/L   Chloride 101 98 - 110 mmol/L   CO2 27 20 - 32 mmol/L   Calcium  9.2 8.6 - 10.3 mg/dL   Total Protein 6.0 (L) 6.1 - 8.1 g/dL   Albumin 3.9 3.6 - 5.1 g/dL   Globulin 2.1 1.9 - 3.7 g/dL (calc)   AG Ratio 1.9 1.0 - 2.5 (calc)   Total Bilirubin 0.7 0.2 - 1.2 mg/dL   Alkaline phosphatase (APISO) 74 35 - 144 U/L   AST 16 10 - 35 U/L   ALT 14 9 - 46 U/L  TSH   Collection Time: 07/06/24  8:15 AM  Result Value Ref Range   TSH 2.05 0.40 - 4.50 mIU/L  PSA   Collection Time: 07/06/24  8:15 AM  Result Value Ref Range   PSA 3.12 < OR = 4.00 ng/mL  CBC with  Differential/Platelet   Collection Time: 07/06/24  8:15 AM  Result Value Ref Range   WBC 6.7 3.8 - 10.8 Thousand/uL   RBC 3.81 (L) 4.20 - 5.80 Million/uL   Hemoglobin 12.2 (L) 13.2 - 17.1 g/dL   HCT 65.1 (L) 60.5 - 48.8 %   MCV 91.3 81.4 - 101.7 fL   MCH 32.0 27.0 - 33.0 pg   MCHC 35.1 31.6 - 35.4 g/dL   RDW 87.3 88.9 - 84.9 %   Platelets 191 140 - 400 Thousand/uL   MPV 9.9 7.5 - 12.5 fL   Neutro Abs 4,301 1,500 - 7,800 cells/uL   Absolute Lymphocytes 1,434 850 - 3,900 cells/uL   Absolute Monocytes 529 200 - 950 cells/uL   Eosinophils Absolute 389 15 - 500 cells/uL   Basophils Absolute 47 0 - 200 cells/uL   Neutrophils Relative % 64.2 %   Total Lymphocyte 21.4 %   Monocytes Relative 7.9 %   Eosinophils Relative 5.8 %   Basophils Relative 0.7 %  Hemoglobin A1c   Collection Time: 07/06/24  8:15 AM  Result Value Ref Range   Hgb A1c MFr Bld 6.1 (H) <5.7 %   Mean Plasma Glucose 128 mg/dL   eAG (mmol/L) 7.1 mmol/L  Lipid panel   Collection Time: 07/06/24  8:15 AM  Result Value Ref Range   Cholesterol 111 <200 mg/dL   HDL 56 > OR = 40 mg/dL   Triglycerides 75 <849 mg/dL   LDL Cholesterol (Calc) 39 mg/dL (calc)   Total CHOL/HDL Ratio 2.0 <5.0 (calc)   Non-HDL Cholesterol (Calc) 55 <869 mg/dL (calc)      Assessment & Plan:   Problem List Items Addressed This Visit     BPH associated with nocturia   Essential hypertension   Relevant Medications   rosuvastatin  (CRESTOR ) 5 MG tablet   hydrochlorothiazide  (HYDRODIURIL ) 12.5 MG tablet   amLODipine  (NORVASC ) 5 MG tablet   losartan  (COZAAR ) 50 MG tablet   Hyperlipidemia associated with type 2 diabetes mellitus (HCC)   Relevant Medications   rosuvastatin  (CRESTOR ) 5 MG tablet   hydrochlorothiazide  (HYDRODIURIL ) 12.5 MG tablet   amLODipine  (NORVASC ) 5 MG tablet   losartan  (COZAAR ) 50 MG tablet   metFORMIN  (GLUCOPHAGE -XR) 500 MG 24 hr tablet   Type 2 diabetes mellitus with  other specified complication (HCC)   Relevant  Medications   rosuvastatin  (CRESTOR ) 5 MG tablet   losartan  (COZAAR ) 50 MG tablet   metFORMIN  (GLUCOPHAGE -XR) 500 MG 24 hr tablet   Other Relevant Orders   Urine Microalbumin w/creat. ratio   Other Visit Diagnoses       Annual physical exam    -  Primary     Long term current use of oral hypoglycemic drug            Updated Health Maintenance information ***- Reviewed recent lab results with patient Encouraged improvement to lifestyle with diet and exercise -*** Goal of weight loss  Assessment and Plan Assessment & Plan      Orders Placed This Encounter  Procedures   Urine Microalbumin w/creat. ratio    Meds ordered this encounter  Medications   rosuvastatin  (CRESTOR ) 5 MG tablet    Sig: Take 1 tablet (5 mg total) by mouth at bedtime.    Dispense:  90 tablet    Refill:  3    Mail order   hydrochlorothiazide  (HYDRODIURIL ) 12.5 MG tablet    Sig: Take 1 tablet (12.5 mg total) by mouth daily.    Dispense:  90 tablet    Refill:  3    Mail order   amLODipine  (NORVASC ) 5 MG tablet    Sig: Take 1 tablet (5 mg total) by mouth daily.    Dispense:  90 tablet    Refill:  3    Mail order   losartan  (COZAAR ) 50 MG tablet    Sig: Take 1 tablet (50 mg total) by mouth daily.    Dispense:  90 tablet    Refill:  3    Mail order   metFORMIN  (GLUCOPHAGE -XR) 500 MG 24 hr tablet    Sig: Take 2 tablets (1,000 mg total) by mouth daily with supper.    Dispense:  180 tablet    Refill:  3    Add extra refills, Mail order     Follow up plan: Return in about 6 months (around 01/07/2025) for 6 month DM A1c, Cards updates.  Marsa Officer, DO [facility] 07/09/2024, 10:02 AM

## 2024-07-09 NOTE — Patient Instructions (Addendum)
 Thank you for coming to the office today.  Excellent results with cholesterol controlled  Recent Labs    01/02/24 1049 07/06/24 0815  HGBA1C 6.3* 6.1*    Anticipated apt with Cardiology for yearly visit in February 2026 - if not scheduled soon, feel free to call them to arrange.  Can ask about heart scanning or screening updates.  [facility]) [facility] [address] [place], [place] [address] Main: [phone]  Please schedule a Follow-up Appointment to: Return in about 6 months (around 01/07/2025) for 6 month DM A1c, Cards updates.  If you have any other questions or concerns, please feel free to call the office or send a message through MyChart. You may also schedule an earlier appointment if necessary.  Additionally, you may be receiving a survey about your experience at our office within a few days to 1 week by e-mail or mail. We value your feedback.  Marsa Officer, DO [facility], [place]

## 2024-07-10 ENCOUNTER — Other Ambulatory Visit (HOSPITAL_COMMUNITY): Payer: Self-pay

## 2024-07-10 DIAGNOSIS — D649 Anemia, unspecified: Secondary | ICD-10-CM | POA: Insufficient documentation

## 2024-07-10 LAB — MICROALBUMIN / CREATININE URINE RATIO
Creatinine, Urine: 108 mg/dL (ref 20–320)
Microalb Creat Ratio: 12 mg/g{creat} (ref <30)
Microalb, Ur: 1.3 mg/dL

## 2024-07-10 NOTE — Progress Notes (Incomplete)
 Subjective:    Patient ID: Isaac Weber, male    DOB: 1945/09/17, 78 y.o.   MRN: [id number]  Isaac Weber is a 78 y.o. male presenting on 07/09/2024 for Annual Exam   HPI  Discussed the use of AI scribe software for clinical note transcription with the patient, who gave verbal consent to proceed.  History of Present Illness   Isaac Weber is a 78 year old male who presents for an annual physical exam.  Diabetes mellitus - Uses continuous glucose monitor on a trial basis. - Glucose readings mostly in the low hundreds, with one instance reaching 190. - Currently taking metformin . - Hemoglobin A1c decreased from 7.5 to 6.1 over the past year and a half.  Hyperlipidemia - On rosuvastatin  5 mg daily for cholesterol management. - LDL improved from 59 to 39 over the past year.  Anemia - Mild anemia with hemoglobin level of 12.2, decreased from 13 a year ago. - Takes ferrous sulfate 325 mg daily. - Donates blood every eight weeks, which may contribute to anemia.  Prostate cancer screening - PSA levels stable at 3.12, decreased from 3.8 two years ago.  Cardiac symptoms - Transient episode of elevated heart rate during a football game, resolved by the next morning. - Takes aspirin 81 mg daily.  Protein levels - Total protein level at 6.0, slightly below normal range. - Regular blood donation since 1966, which may affect protein levels.  Preventive care - Up to date with vaccinations: flu shot in October, pneumonia vaccine last year, shingles vaccine. - Colon cancer screening paused due to age and insurance coverage limitations.       ***LDL down from 59 > 39 lbs ***A1c down to 6.1 improved from 6.3-6.5 *** Hgb 12.2, slight reduction, previous range 12-13, on ferrous sulfate 325mg  daily and donating every 8 weeks  *** Transient elevation of HR possible trigger ***  He experiences occasional sharp ear pain, particularly when tugging at his ear. He does not  believe he has a wax problem and describes the pain as 'really sharp.' No frequent ear wax buildup.   He is currently experiencing hip pain, for which he is taking prednisone . He is on his third dose and reports some improvement, although the pain persists. He has a history of sacroiliac joint issues, which have been treated previously but did not respond to recent interventions. He is under the care of a sports doctor for this condition.   He is up to date with his pneumonia and shingles vaccinations, foot and urine checks, and had a colonoscopy in October 2022.       CHRONIC DM, Type 2 Hyperlipidemia ASCVD Risk LDL controlled. He remains on Rosuvastatin  5mg  daily for cardiovascular prevention. His recent A1c level is 6.3, with previous A1c levels over the past six months ranging from 6.3 to 7.0, indicating well-controlled diabetes. Meds: Metformin  XR 500mg  x2 = 1000mg  daily at bedtime, tolerating well OneTouch Ultra 2 test supplies  Reports good compliance. Tolerating well w/o side-effects Currently on ARB, Statin Followed by Dr Milissa [facility] [place] - apt later today 01/02/24 Denies hypoglycemia, polyuria, visual changes, numbness or tingling.   ASCVD Risk LDL controlled. He remains on Rosuvastatin  5mg  daily for cardiovascular prevention.   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 He gets BP checked every 8 weeks with donating blood. K has  been stable on labs Current Meds - Losartan  50mg  and HCTZ 12.5mg  daily, Amlodipine  5mg  daily Reports good compliance, took meds today. Tolerating well, w/o complaints. Denies CP, dyspnea, HA, edema, dizziness / lightheadedness   History of Iron Deficiency Donates blood every 8 weeks. He takes iron supplement.   Sacroiliac /Lumbar Pain / Osteoarthritis, bilateral knees, lumbar spine Chronic problem,  multiple joints, episodic pain and flares He has has seen chiropractor as well. following with Sports Med Dr Alvia, on steroid course Previously chiropractor helping now not anymore   Prior history PSA with improvement from 4.06 down to 3.85, next yearly visit Dr Francisca CHERRY 12/2022, he may resolve the follow up and just check lab here  ***PSA 3.12, improved from 3.31    PMH Bilateral Rotator Cuff Impingement / Shoulder Pain     Last Colonoscopy 04/30/21 - polyps, repeat 3 years in 2025 if interested. He declines today  Health Maintenance:  UTD Flu, Prevnar-20, Shingles completed     04/26/2024    1:07 PM 01/02/2024   10:43 AM 07/04/2023    1:17 PM  Depression screen PHQ 2/9  Decreased Interest 0 0 0  Down, Depressed, Hopeless 0 0 0  PHQ - 2 Score 0 0 0  Altered sleeping  0   Tired, decreased energy  0   Change in appetite  0   Feeling bad or failure about yourself   0   Trouble concentrating  0   Moving slowly or fidgety/restless  0   Suicidal thoughts  0   PHQ-9 Score  0    Difficult doing work/chores  Not difficult at all      Data saved with a previous flowsheet row definition       01/02/2024   10:43 AM 07/04/2023    1:18 PM 02/25/2023    9:46 AM 12/23/2022    9:25 AM  GAD 7 : Generalized Anxiety Score  Nervous, Anxious, on Edge 0 0 0 0  Control/stop worrying 0 0 0 0  Worry too much - different things 0 0 0 0  Trouble relaxing 0 0 0 0  Restless 0 0 0 0  Easily annoyed or irritable 0 0 0 0  Afraid - awful might happen 0 0 0 0  Total GAD 7 Score 0 0 0 0  Anxiety Difficulty Not difficult at all  Not difficult at all      Past Medical History:  Diagnosis Date   Abnormal chest CT    Actinic keratosis    Allergic rhinitis, unspecified    Anemia    Aortic atherosclerosis    Arthritis    BPH (benign prostatic hyperplasia)    COPD (chronic obstructive pulmonary disease) (HCC)    Coronary artery disease    Cough variant asthma    Diabetes mellitus  without complication (HCC)    Glaucoma    Hypertension    Other chronic pain    Pancreatic lesion    Pneumonia    Pure hypercholesterolemia, unspecified    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 WD SCC, ulcerated. [facility] 04/29/2020   Past Surgical History:  Procedure Laterality Date   APPLICATION OF INTRAOPERATIVE CT SCAN N/A  05/12/2022   Procedure: APPLICATION OF INTRAOPERATIVE CT SCAN;  Surgeon: Clois Fret, MD;  Location: [facility] OR[facility];  Service: Neurosurgery;  Laterality: N/A;   BACK SURGERY     BRONCHIAL WASHINGS  11/26/2021   Procedure: BRONCHIAL WASHINGS;  Surgeon: Geronimo Amel, MD;  Location: [facility];  Service: Endoscopy;;   BRONCHIAL WASHINGS  05/30/2023   Procedure: BRONCHIAL WASHINGS;  Surgeon: Geronimo Amel, MD;  Location: [facility];  Service: Endoscopy;;   BUNIONECTOMY Left 2003   hammer toe as well   BUNIONECTOMY WITH HAMMERTOE RECONSTRUCTION Left 2013   repeat   CERVICAL DISC SURGERY     COLONOSCOPY WITH PROPOFOL  N/A 04/30/2021   Procedure: COLONOSCOPY WITH PROPOFOL ;  Surgeon: Therisa Bi, MD;  Location: [facility] END[facility];  Service: Gastroenterology;  Laterality: N/A;   GREEN LIGHT LASER TURP (TRANSURETHRAL RESECTION OF PROSTATE  2010   laser, shrink prostate   KNEE ARTHROSCOPY Right 1991   LEG SURGERY Left    BENIGN BONE TUMOR   LUMBAR DISC SURGERY  2010   discectomy   LUMBAR DISC SURGERY  2011   LUMBAR LAMINECTOMY/DECOMPRESSION MICRODISCECTOMY Right 05/12/2022   Procedure: RIGHT L5-S1 DISCECTOMY;  Surgeon: Clois Fret, MD;  Location: [facility];  Service: Neurosurgery;  Laterality: Right;   PROSTATE SURGERY  2002   shrink prostate    ROTATOR CUFF REPAIR Left 1999   debride, remove bonespur   ROTATOR CUFF REPAIR Right 1996   TONSILLECTOMY     TRANSFORAMINAL LUMBAR INTERBODY FUSION W/ MIS 1 LEVEL Right 05/12/2022   Procedure: RIGHT L4-5 MINIMALLY INVASIVE (MIS) TRANSFORAMINAL LUMBAR INTERBODY FUSION (TLIF);  Surgeon: Clois Fret, MD;  Location: [facility] OR[facility];  Service: Neurosurgery;  Laterality: Right;   VIDEO BRONCHOSCOPY N/A 11/26/2021   Procedure: VIDEO BRONCHOSCOPY WITHOUT FLUORO;  Surgeon: Geronimo Amel, MD;  Location: [facility];  Service: Endoscopy;  Laterality: N/A;  for chroni ccough BAL   VIDEO BRONCHOSCOPY N/A 05/30/2023   Procedure: VIDEO BRONCHOSCOPY WITHOUT FLUORO;  Surgeon: Geronimo Amel, MD;  Location: [facility];  Service: Endoscopy;  Laterality: N/A;   Social History   Socioeconomic History   Marital status: Married    Spouse name: Lyndol Vanderheiden   Number of children: Not on file   Years of education: 12   Highest education level: 12th grade  Occupational History   Occupation: retired  Tobacco Use   Smoking status: Former    Current packs/day: 0.00    Average packs/day: 3.0 packs/day for 26.0 years (78.0 ttl pk-yrs)    Types: Cigarettes    Start date: 09/16/1945    Quit date: 09/17/1971    Years since quitting: 52.8    Passive exposure: Past   Smokeless tobacco: Never  Vaping Use   Vaping status: Never Used  Substance and Sexual Activity   Alcohol use: Yes    Alcohol/week: 2.0 standard drinks of alcohol    Types: 2 Standard drinks or equivalent per week   Drug use: Never   Sexual activity: Yes    Partners: Female  Other Topics Concern   Not on file  Social History Narrative   Not on file   Social Drivers of Health   Tobacco Use: Medium Risk (07/09/2024)   Patient History    Smoking Tobacco Use: Former    Smokeless Tobacco Use: Never    Passive Exposure: Past  Physicist, Medical Strain: Low Risk (07/05/2024)   Overall Financial Resource Strain  (CARDIA)    Difficulty of Paying Living Expenses: Not hard at all  Food Insecurity: No Food Insecurity (  07/05/2024)   Epic    Worried About Programme Researcher, Broadcasting/film/video in the Last Year: Never true    [employer] of Food in the Last Year: Never true  Transportation Needs: No Transportation Needs (07/05/2024)   Epic    Lack of Transportation (Medical): No    Lack of Transportation (Non-Medical): No  Physical Activity: Sufficiently Active (04/26/2024)   Exercise Vital Sign    Days of Exercise per Week: 7 days    Minutes of Exercise per Session: 30 min  Recent Concern: Physical Activity - Insufficiently Active (03/12/2024)   Exercise Vital Sign    Days of Exercise per Week: 3 days    Minutes of Exercise per Session: 30 min  Stress: No Stress Concern Present (04/26/2024)   [employer] of Occupational Health - Occupational Stress Questionnaire    Feeling of Stress: Not at all  Social Connections: Socially Integrated (07/05/2024)   Social Connection and Isolation Panel    Frequency of Communication with Friends and Family: Three times a week    Frequency of Social Gatherings with Friends and Family: Once a week    Attends Religious Services: More than 4 times per year    Active Member of Clubs or Organizations: Yes    Attends Banker Meetings: More than 4 times per year    Marital Status: Married  Catering Manager Violence: Not At Risk (04/26/2024)   Epic    Fear of Current or Ex-Partner: No    Emotionally Abused: No    Physically Abused: No    Sexually Abused: No  Depression (PHQ2-9): Low Risk (04/26/2024)   Depression (PHQ2-9)    PHQ-2 Score: 0  Alcohol Screen: Low Risk (07/05/2024)   Alcohol Screen    Last Alcohol Screening Score (AUDIT): 1  Housing: Low Risk (07/05/2024)   Epic    Unable to Pay for Housing in the Last Year: No    Number of Times Moved in the Last Year: 0    Homeless in the Last Year: No  Utilities: Not At Risk (04/26/2024)   Epic     Threatened with loss of utilities: No  Health Literacy: Adequate Health Literacy (04/26/2024)   B1300 Health Literacy    Frequency of need for help with medical instructions: Never   Family History  Problem Relation Age of Onset   Heart disease Mother 26   Emphysema Father 75   Heart disease Brother 70   Current Outpatient Medications on File Prior to Visit  Medication Sig   albuterol  (VENTOLIN  HFA) 108 (90 Base) MCG/ACT inhaler Inhale 2 puffs into the lungs every 6 (six) hours as needed for wheezing or shortness of breath.   Blood Glucose Monitoring Suppl (ONE TOUCH ULTRA 2) w/Device KIT Use to check blood sugar up to 2 times daily   ferrous sulfate 325 (65 FE) MG tablet Take 325 mg by mouth daily with breakfast.   FIBER COMPLETE PO Take by mouth.   Glucosamine-Chondroitin (GLUCOSAMINE CHONDR COMPLEX PO) Take 1 capsule by mouth 2 (two) times daily.   ketoconazole  (NIZORAL ) 2 % shampoo Apply to scalp and let sit for 10 minutes then wash off, use 3 times a week.   Multiple Minerals-Vitamins (CAL MAG ZINC +D3 PO) Take 1 tablet by mouth every evening.   Multiple Vitamin (MULTIVITAMIN WITH MINERALS) TABS tablet Take 1 tablet by mouth daily.   ONETOUCH ULTRA test strip Use to check blood sugar up to twice per day   tamsulosin  (FLOMAX ) 0.4 MG CAPS  capsule Take 2 capsules (0.8 mg total) by mouth daily after supper.   No current facility-administered medications on file prior to visit.    Review of Systems Per HPI unless specifically indicated above     Objective:    BP (!) 120/58 (BP Location: Left Arm, Patient Position: Sitting, Cuff Size: Normal)   Pulse 88   Ht 5' 9 (1.753 m)   Wt 168 lb 8 oz (76.4 kg)   SpO2 99%   BMI 24.88 kg/m   Wt Readings from Last 3 Encounters:  07/09/24 168 lb 8 oz (76.4 kg)  04/26/24 165 lb (74.8 kg)  03/13/24 165 lb 2 oz (74.9 kg)    Physical Exam  Diabetic Foot Exam - Simple   Simple Foot Form Diabetic Foot exam was performed  with the following findings: Yes 07/09/2024 10:09 AM  Visual Inspection See comments: Yes Sensation Testing Intact to touch and monofilament testing bilaterally: Yes Pulse Check Posterior Tibialis and Dorsalis pulse intact bilaterally: Yes Comments Mild callus formation Left forefoot and toes, with some hammer toe deformity, intact sensation to monofilament.      Results for orders placed or performed in visit on 07/04/24  Comprehensive metabolic panel with GFR   Collection Time: 07/06/24  8:15 AM  Result Value Ref Range   Glucose, Bld 124 (H) 65 - 99 mg/dL   BUN 16 7 - 25 mg/dL   Creat 9.21 9.29 - 8.71 mg/dL   eGFR 91 > OR = 60 fO/fpw/8.26f7   BUN/Creatinine Ratio SEE NOTE: 6 - 22 (calc)   Sodium 135 135 - 146 mmol/L   Potassium 3.9 3.5 - 5.3 mmol/L   Chloride 101 98 - 110 mmol/L   CO2 27 20 - 32 mmol/L   Calcium  9.2 8.6 - 10.3 mg/dL   Total Protein 6.0 (L) 6.1 - 8.1 g/dL   Albumin 3.9 3.6 - 5.1 g/dL   Globulin 2.1 1.9 - 3.7 g/dL (calc)   AG Ratio 1.9 1.0 - 2.5 (calc)   Total Bilirubin 0.7 0.2 - 1.2 mg/dL   Alkaline phosphatase (APISO) 74 35 - 144 U/L   AST 16 10 - 35 U/L   ALT 14 9 - 46 U/L  TSH   Collection Time: 07/06/24  8:15 AM  Result Value Ref Range   TSH 2.05 0.40 - 4.50 mIU/L  PSA   Collection Time: 07/06/24  8:15 AM  Result Value Ref Range   PSA 3.12 < OR = 4.00 ng/mL  CBC with Differential/Platelet   Collection Time: 07/06/24  8:15 AM  Result Value Ref Range   WBC 6.7 3.8 - 10.8 Thousand/uL   RBC 3.81 (L) 4.20 - 5.80 Million/uL   Hemoglobin 12.2 (L) 13.2 - 17.1 g/dL   HCT 65.1 (L) 60.5 - 48.8 %   MCV 91.3 81.4 - 101.7 fL   MCH 32.0 27.0 - 33.0 pg   MCHC 35.1 31.6 - 35.4 g/dL   RDW 87.3 88.9 - 84.9 %   Platelets 191 140 - 400 Thousand/uL   MPV 9.9 7.5 - 12.5 fL   Neutro Abs 4,301 1,500 - 7,800 cells/uL   Absolute Lymphocytes 1,434 850 - 3,900 cells/uL   Absolute Monocytes 529 200 - 950 cells/uL   Eosinophils Absolute 389 15 - 500 cells/uL    Basophils Absolute 47 0 - 200 cells/uL   Neutrophils Relative % 64.2 %   Total Lymphocyte 21.4 %   Monocytes Relative 7.9 %   Eosinophils Relative 5.8 %   Basophils Relative  0.7 %  Hemoglobin A1c   Collection Time: 07/06/24  8:15 AM  Result Value Ref Range   Hgb A1c MFr Bld 6.1 (H) <5.7 %   Mean Plasma Glucose 128 mg/dL   eAG (mmol/L) 7.1 mmol/L  Lipid panel   Collection Time: 07/06/24  8:15 AM  Result Value Ref Range   Cholesterol 111 <200 mg/dL   HDL 56 > OR = 40 mg/dL   Triglycerides 75 <849 mg/dL   LDL Cholesterol (Calc) 39 mg/dL (calc)   Total CHOL/HDL Ratio 2.0 <5.0 (calc)   Non-HDL Cholesterol (Calc) 55 <869 mg/dL (calc)      Assessment & Plan:   Problem List Items Addressed This Visit     BPH associated with nocturia   Essential hypertension   Relevant Medications   rosuvastatin  (CRESTOR ) 5 MG tablet   hydrochlorothiazide  (HYDRODIURIL ) 12.5 MG tablet   amLODipine  (NORVASC ) 5 MG tablet   losartan  (COZAAR ) 50 MG tablet   Hyperlipidemia associated with type 2 diabetes mellitus (HCC)   Relevant Medications   rosuvastatin  (CRESTOR ) 5 MG tablet   hydrochlorothiazide  (HYDRODIURIL ) 12.5 MG tablet   amLODipine  (NORVASC ) 5 MG tablet   losartan  (COZAAR ) 50 MG tablet   metFORMIN  (GLUCOPHAGE -XR) 500 MG 24 hr tablet   Normocytic anemia   Type 2 diabetes mellitus with other specified complication (HCC)   Relevant Medications   rosuvastatin  (CRESTOR ) 5 MG tablet   losartan  (COZAAR ) 50 MG tablet   metFORMIN  (GLUCOPHAGE -XR) 500 MG 24 hr tablet   Other Relevant Orders   Urine Microalbumin w/creat. ratio   Other Visit Diagnoses       Annual physical exam    -  Primary     Long term current use of oral hypoglycemic drug            Updated Health Maintenance information Reviewed recent lab results with patient Encouraged improvement to lifestyle with diet and exercise Goal of weight loss  Adult Wellness Visit Annual wellness visit completed. Vaccinations current.  Colon cancer screening paused due to age and insurance. Foot callus noted. - Continue current vaccination schedule. - Paused colon cancer screening. - Monitor foot callus for changes or ulceration.  Type 2 diabetes mellitus A1c improved to 6.1. previous trial on CGM - Continue metformin  XR 500mg  x 2 = 1000mg  daily with additional refills. - Monitor blood glucose levels as needed.  Essential hypertension Blood pressure managed with current medications. Transient heart rate elevation likely stress-related. - Continue current antihypertensive medications: amlodipine , hydrochlorothiazide , and losartan . - Monitor for persistent or concerning changes in blood pressure or heart rate.  Hyperlipidemia Cholesterol controlled with rosuvastatin . LDL decreased to 39, below target. - Continue rosuvastatin  with yearly refills.  BPH  Improved On Tamsulosin  0.8mg   Anemia, normocytic Mild anemia with hemoglobin at 12.2. Ferrous sulfate in use. Blood donation may contribute. - Continue ferrous sulfate supplementation. - Monitor hemoglobin levels.          Orders Placed This Encounter  Procedures   Urine Microalbumin w/creat. ratio    Meds ordered this encounter  Medications   rosuvastatin  (CRESTOR ) 5 MG tablet    Sig: Take 1 tablet (5 mg total) by mouth at bedtime.    Dispense:  90 tablet    Refill:  3    Mail order   hydrochlorothiazide  (HYDRODIURIL ) 12.5 MG tablet    Sig: Take 1 tablet (12.5 mg total) by mouth daily.    Dispense:  90 tablet    Refill:  3    Mail  order   amLODipine  (NORVASC ) 5 MG tablet    Sig: Take 1 tablet (5 mg total) by mouth daily.    Dispense:  90 tablet    Refill:  3    Mail order   losartan  (COZAAR ) 50 MG tablet    Sig: Take 1 tablet (50 mg total) by mouth daily.    Dispense:  90 tablet    Refill:  3    Mail order   metFORMIN  (GLUCOPHAGE -XR) 500 MG 24 hr tablet    Sig: Take 2 tablets (1,000 mg total) by mouth daily with supper.    Dispense:   180 tablet    Refill:  3    Add extra refills, Mail order     Follow up plan: Return in about 6 months (around 01/07/2025) for 6 month DM A1c, Cards updates.  Marsa Officer, DO [facility] 07/09/2024, 10:02 AM

## 2024-07-13 ENCOUNTER — Ambulatory Visit: Payer: Self-pay | Admitting: Family Medicine

## 2024-08-07 ENCOUNTER — Other Ambulatory Visit (HOSPITAL_COMMUNITY): Payer: Self-pay

## 2024-08-07 ENCOUNTER — Other Ambulatory Visit: Payer: Self-pay

## 2024-08-13 ENCOUNTER — Encounter: Payer: Self-pay | Admitting: Primary Care

## 2024-08-13 ENCOUNTER — Ambulatory Visit: Admitting: Primary Care

## 2024-08-13 VITALS — BP 124/62 | HR 79 | Temp 97.9°F | Ht 68.0 in | Wt 170.4 lb

## 2024-08-13 DIAGNOSIS — J479 Bronchiectasis, uncomplicated: Secondary | ICD-10-CM

## 2024-08-13 DIAGNOSIS — J432 Centrilobular emphysema: Secondary | ICD-10-CM | POA: Diagnosis not present

## 2024-08-13 DIAGNOSIS — J45991 Cough variant asthma: Secondary | ICD-10-CM

## 2024-08-13 DIAGNOSIS — Z87891 Personal history of nicotine dependence: Secondary | ICD-10-CM

## 2024-08-13 DIAGNOSIS — R053 Chronic cough: Secondary | ICD-10-CM

## 2024-08-13 NOTE — Patient Instructions (Signed)
" °  VISIT SUMMARY: Isaac Weber, you came in today for a follow-up on your chronic cough and bronchiectasis. You have been doing fairly well since your last visit in July 2024, with no recent exacerbations or significant symptoms. We discussed your current condition and reviewed your treatment plan.  YOUR PLAN: -BRONCHIECTASIS: Bronchiectasis is a condition where the airways in the lungs become damaged and widened, leading to mucus build-up and infections. Your condition has been stable with no recent exacerbations. Continue using your albuterol  inhaler as needed for wheezing or shortness of breath. We will repeat spirometry at your next follow-up to monitor your lung function. Watch for symptoms of exacerbation such as colored mucus, increased mucus production, shortness of breath, or fever. If these occur, we may consider antibiotics, steroids, and a chest CT.  -CENTRILOBULAR EMPHYSEMA: Centrilobular emphysema is a type of chronic lung disease where the small airways in the lungs are damaged, often due to smoking. Your condition is mild and currently not causing any symptoms. We will continue to monitor your lung function with spirometry at your next follow-up.  -COUGH VARIANT ASTHMA: Cough variant asthma is a type of asthma where the main symptom is a chronic cough. Your slight eosinophilia and elevated FENO suggest a possible asthmatic component. Currently, there are no symptoms warranting treatment with inhalers, but we will consider retrying them if symptoms become bothersome or return. Use your albuterol  inhaler as needed for wheezing or shortness of breath.  INSTRUCTIONS: We will repeat spirometry at your next follow-up to monitor your lung function. Please watch for symptoms of exacerbation such as colored mucus, increased mucus production, shortness of breath, or fever. If these occur, contact us  as we may need to consider antibiotics, steroids, and a chest CT.  ORDERS Spirometry at next visit    Follow-up 6 months with [facility] NP or sooner if needed / 30 minute PFT prior  "

## 2024-08-13 NOTE — Progress Notes (Signed)
 "  @Patient  ID: Isaac Weber, male    DOB: [birth year], 79 y.o.   MRN: [id number]  No chief complaint on file.   Referring provider: Edman Marsa PARAS, DO  HPI: 79 year old, former smoker quit in 1973 (78 pack year hx).  Past medical history significant for hypertension, emphysema, cough variant asthma, hyperlipidemia, iron deficiency anemia. Patient of Dr. Geronimo.    Previous [facility] pulmonary encounter: 06/13/2023 -   Chief Complaint  Patient presents with   Follow-up    Bronch f/u    Isaac Weber 79 y.o. -chronic cough returns for follow-up.  After the bronchoscopy is doing well.  Cough is minimal.  He says at this point in time he is try Trelegy Asmanex  and Spiriva  a for the cough.  Wife is also here she is independent historian affirms the same.  But these have not helped.  Previous gabapentin  has not helped.  He was really confirmed about recurrence of colonization.  So we did a bronchoscopy with lavage number 12/13/2022.  The liver shows a lot of neutrophils just like last time but cultures negative so far.  At this point in time I reviewed the results and shared it with him.  He and his wife have resolved to just tolerate the cough.  After he left I heard from one of my colleagues that one of his patients reported improvement with some over-the-counter cough remedies.  I have asked the CMA to share this with the patient.    Dr Felice Reflux Symptom Index (> 13-15 suggestive of LPR cough) Results for Isaac Weber, Isaac Weber (MRN [id number]) as of 05/31/2018 09:46  Ref. Range 03/09/2018 18:36 03/09/2018 18:45  Eosinophils Absolute Latest Ref Range: 0.0 - 0.7 K/uL 0.4    0 -> 5  =  none ->severe problem.td 07/08/2017  08/18/2017  02/28/2018  05/31/2018 asmanex  1 bid 02/20/2019  01/16/2021  03/23/2021  11/20/2021 Broch and then triple organism+ 03/29/2023  06/13/2023   Hoarseness of problem with voice 1 2 2 3 3 1 1 1  0 0  Clearing  Of Throat 3 2 2  0 2 3 2 3 2  0  Excess throat  mucus or feeling of post nasal drip 1 0 3 0 3 5 4 5 0 0   Difficulty swallowing food, liquid or tablets 0 0 0 0 0 0 0 0 0 0   Cough after eating or lying down 4 1 5 1 1 5 5 4 4 2   Breathing difficulties or choking episodes 0 0 0 0 0 4 3 4  ` 0  Troublesome or annoying cough 5 2 5 1 3 5 5 5 5 3   Sensation of something sticking in throat or lump in throat 0 0 0 0 0 0 0 0 0 0   Heartburn, chest pain, indigestion, or stomach acid coming up 0 0 0 0 0 0 2 0 0 0   TOTAL 14 7 17 5 12 23 22 22 12 5      Patient Instructions     ICD-10-CM   1. Chronic cough  R05.3     2. Bronchiectasis without complication (HCC)  J47.9      Currently cough is at an acceptable level in terms of quality of life interference.  Bronchoscopy 05/30/2023 without any growth in culture but does show inflammatory cells on a chronic basis.  Multiple different treatment efforts have failed in the past.  Currently basis supportive care  Plan - New ongoing supportive care  Follow-up - 6 months or  sooner if needed   02/09/2024 Discussed the use of AI scribe software for clinical note transcription with the patient, who gave verbal consent to proceed.  History of Present Illness   Isaac Weber is a 79 year old male who presents with a persistent cough.  He has experienced a persistent cough that has worsened since April. The cough is mostly dry, with occasional minimal mucus production. He has tried various treatments, including 750 mg of Cipro  twice a day, which was previously effective, but a recent course of 500 mg did not alleviate symptoms. He has also used mullein tea and Mucinex  with varying success. No acid reflux or fever is reported.  The cough is causing significant fatigue, leading to early bedtimes due to exhaustion. He experiences shortness of breath with exertion, such as bending or cleaning shower, which resolves quickly. He occasionally uses a rescue inhaler and has a flutter valve, though he admits to  inconsistent use.  He recalls a bronchoscopy that showed no growth and multiple failed treatments in the past. A chest x-ray was performed in November, and he was on prednisone  for hip pain about a month ago, though the dose is not recalled.  He wants to return to playing golf.    08/13/2024 Discussed the use of AI scribe software for clinical note transcription with the patient, who gave verbal consent to proceed.  History of Present Illness Isaac Weber is a 79 year old male with chronic cough and bronchiectasis who presents for follow-up.  He was last seen in July 2024 for an acute exacerbation of his chronic cough and bronchiectasis, treated with prednisone . A chest x-ray at that time was normal. Since then, he has been doing fairly well, although he uses the flutter valve only occasionally.  His cough is described as 'pretty good' with no colored mucus production. He experiences shortness of breath only when bending over in the shower, which resolves quickly. He has a rescue inhaler, albuterol , available for use.  He recalls a recent episode where wheezing was noted in the right lung. He has used several inhalers, including Trelegy, Asmanex , Areva, and Breo, but does not find them particularly helpful.  He has a history of mild emphysema, diffuse bronchial wall thickening, and slight eosinophilia. His FENO was previously elevated. A past CT scan showed no interstitial lung disease.  He quit smoking in 1973 after a 20 pack-year history. He has been taking Malin tea daily, which he feels has contributed positively to his condition.  Bronchoscopy 05/30/2023 without any growth in culture but does show inflammatory cells on a chronic basis.  Multiple different treatment efforts have failed in the past.    In July 2022, a breathing test showed his lung function was at 97%. He has not had a flare-up in the last six months. No recent symptoms such as increased mucus production, fever,  or colored mucus.    Allergies[1]  Immunization History  Administered Date(s) Administered   Fluad Quad(high Dose 65+) 05/13/2019, 06/09/2020, 04/21/2021, 04/22/2022   Fluad Trivalent(High Dose 65+) 04/21/2023   INFLUENZA, HIGH DOSE SEASONAL PF 07/15/2017, 04/12/2018, 05/10/2024   PFIZER(Purple Top)SARS-COV-2 Vaccination 02/13/2020, 03/05/2020   PNEUMOCOCCAL CONJUGATE-20 07/04/2023   Pneumococcal Conjugate-13 01/28/2014   Pneumococcal Polysaccharide-23 03/04/2008, 08/26/2020   Tdap 07/12/2011   Zoster Recombinant(Shingrix) 04/12/2017, 08/17/2017    Past Medical History:  Diagnosis Date   Abnormal chest CT    Actinic keratosis    Allergic rhinitis, unspecified    Anemia  Aortic atherosclerosis    Arthritis    BPH (benign prostatic hyperplasia)    COPD (chronic obstructive pulmonary disease) (HCC)    Coronary artery disease    Cough variant asthma    Diabetes mellitus without complication (HCC)    Glaucoma    Hypertension    Other chronic pain    Pancreatic lesion    Pneumonia    Pure hypercholesterolemia, unspecified    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 WD SCC, ulcerated. [facility] 04/29/2020    Tobacco History: Tobacco Use History[2] Counseling given: Not Answered   Outpatient Medications Prior to Visit  Medication Sig Dispense Refill   albuterol  (VENTOLIN  HFA) 108 (90 Base) MCG/ACT inhaler Inhale 2 puffs into the lungs every 6 (six) hours as needed for wheezing or shortness of breath. 6.7 g 0   amLODipine  (NORVASC ) 5 MG tablet Take 1 tablet (5 mg total) by mouth daily. 90 tablet 3   Blood Glucose Monitoring Suppl (ONE TOUCH ULTRA 2) w/Device KIT Use to check blood sugar up to 2  times daily 1 kit 0   ferrous sulfate 325 (65 FE) MG tablet Take 325 mg by mouth daily with breakfast.     FIBER COMPLETE PO Take by mouth.     Glucosamine-Chondroitin (GLUCOSAMINE CHONDR COMPLEX PO) Take 1 capsule by mouth 2 (two) times daily.     hydrochlorothiazide  (HYDRODIURIL ) 12.5 MG tablet Take 1 tablet (12.5 mg total) by mouth daily. 90 tablet 3   ketoconazole  (NIZORAL ) 2 % shampoo Apply to scalp and let sit for 10 minutes then wash off, use 3 times a week. 120 mL 10   losartan  (COZAAR ) 50 MG tablet Take 1 tablet (50 mg total) by mouth daily. 90 tablet 3   metFORMIN  (GLUCOPHAGE -XR) 500 MG 24 hr tablet Take 2 tablets (1,000 mg total) by mouth daily with supper. 180 tablet 3   Multiple Minerals-Vitamins (CAL MAG ZINC +D3 PO) Take 1 tablet by mouth every evening.     Multiple Vitamin (MULTIVITAMIN WITH MINERALS) TABS tablet Take 1 tablet by mouth daily.     ONETOUCH ULTRA test strip Use to check blood sugar up to twice per day 200 each 5   rosuvastatin  (CRESTOR ) 5 MG tablet Take 1 tablet (5 mg total) by mouth at bedtime. 90 tablet 3   tamsulosin  (FLOMAX ) 0.4 MG CAPS capsule Take 2 capsules (0.8 mg total) by mouth daily after supper. 90 capsule 6   No facility-administered medications prior to visit.      Review of Systems  Review of Systems  Constitutional: Negative.   HENT: Negative.    Respiratory: Negative.  Negative for cough, shortness of breath and wheezing.   Cardiovascular: Negative.    Physical Exam  There were no vitals taken for this visit. Physical Exam Constitutional:      Appearance: Normal appearance. He is well-developed.  HENT:     Head: Normocephalic and atraumatic.     Mouth/Throat:     Mouth: Mucous membranes are moist.     Pharynx: Oropharynx is clear.  Cardiovascular:     Rate and Rhythm: Normal rate and regular rhythm.     Heart sounds: Normal heart sounds.  Pulmonary:  Effort: Pulmonary effort is normal. No respiratory distress.     Breath  sounds: Normal breath sounds. No wheezing or rhonchi.  Musculoskeletal:        General: Normal range of motion.     Cervical back: Normal range of motion and neck supple.  Skin:    General: Skin is warm and dry.     Findings: No erythema or rash.  Neurological:     General: No focal deficit present.     Mental Status: He is alert and oriented to person, place, and time. Mental status is at baseline.  Psychiatric:        Mood and Affect: Mood normal.        Behavior: Behavior normal.        Thought Content: Thought content normal.        Judgment: Judgment normal.     Lab Results:  CBC    Component Value Date/Time   WBC 6.7 07/06/2024 0815   RBC 3.81 (L) 07/06/2024 0815   HGB 12.2 (L) 07/06/2024 0815   HGB 11.6 (L) 09/16/2014 1354   HCT 34.8 (L) 07/06/2024 0815   HCT 33.6 (L) 09/16/2014 1354   PLT 191 07/06/2024 0815   PLT 232 09/16/2014 1354   MCV 91.3 07/06/2024 0815   MCV 81.4 09/16/2014 1354   MCH 32.0 07/06/2024 0815   MCHC 35.1 07/06/2024 0815   RDW 12.6 07/06/2024 0815   RDW 13.9 09/16/2014 1354   LYMPHSABS 1,632 12/16/2022 0818   LYMPHSABS 1.8 09/16/2014 1354   MONOABS 0.7 01/28/2022 1012   MONOABS 0.6 09/16/2014 1354   EOSABS 389 07/06/2024 0815   EOSABS 0.3 09/16/2014 1354   BASOSABS 47 07/06/2024 0815   BASOSABS 0.0 09/16/2014 1354    BMET    Component Value Date/Time   NA 135 07/06/2024 0815   NA 140 09/16/2014 1354   K 3.9 07/06/2024 0815   K 3.7 09/16/2014 1354   CL 101 07/06/2024 0815   CO2 27 07/06/2024 0815   CO2 26 09/16/2014 1354   GLUCOSE 124 (H) 07/06/2024 0815   GLUCOSE 153 (H) 09/16/2014 1354   BUN 16 07/06/2024 0815   BUN 16.7 09/16/2014 1354   CREATININE 0.78 07/06/2024 0815   CREATININE 0.9 09/16/2014 1354   CALCIUM  9.2 07/06/2024 0815   CALCIUM  9.4 09/16/2014 1354   GFRNONAA >60 05/14/2022 0306   GFRNONAA 88 06/02/2020 0852   GFRAA 102 06/02/2020 0852    BNP No results found for: BNP  ProBNP    Component Value  Date/Time   PROBNP 13.0 01/15/2022 1503    Imaging: No results found.   Assessment & Plan:   1. Centrilobular emphysema (HCC) (Primary)  2. Chronic cough  Assessment and Plan Assessment & Plan Bronchiectasis without complication  Former smoker, quit in 1973. Chronic cough and bronchiectasis with no recent exacerbations. Last treated with prednisone  in July for an acute exacerbation. Chest x-ray in July was normal. No colored mucus or increased mucus production. Occasional shortness of breath in the shower, resolving quickly. No recent wheezing or significant symptoms. Previous CT scan in October 2024 showed no interstitial lung disease but mild emphysema and diffuse bronchial wall thickening suggestive of COPD.  - Use albuterol  inhaler as needed for wheezing or shortness of breath. - Will repeat spirometry at next follow-up to monitor lung function. - Monitor for symptoms of exacerbation such as colored mucus, increased mucus production, shortness of breath, or fever. - If exacerbation occurs, will consider antibiotics, steroids, and  chest CT.  Centrilobular emphysema Mild centrilobular emphysema noted on previous CT scan, likely related to smoking history. No current symptoms suggestive of exacerbation. - Continue to monitor lung function with spirometry at next follow-up.  Cough variant asthma Slight eosinophilia and elevated FENO suggest a possible asthmatic component. No current symptoms warranting treatment with inhalers. Previous inhalers included Trelegy, Asmanex , Areva, and Breo, but no current need for these medications. - Will consider retrying inhalers if symptoms become bothersome or return. - Use albuterol  inhaler as needed for wheezing or shortness of breath.  Almarie LELON Ferrari, NP 08/13/2024     [1]  Allergies Allergen Reactions   Morphine Diarrhea, Nausea And Vomiting and Other (See Comments)   Morphine And Codeine  Diarrhea and Nausea And Vomiting  [2]   Social History Tobacco Use  Smoking Status Former   Current packs/day: 0.00   Average packs/day: 3.0 packs/day for 26.0 years (78.0 ttl pk-yrs)   Types: Cigarettes   Start date: 09/[birth year]/1945   Quit date: 09/17/1971   Years since quitting: 52.9   Passive exposure: Past  Smokeless Tobacco Never   "

## 2024-08-29 ENCOUNTER — Other Ambulatory Visit (HOSPITAL_COMMUNITY): Payer: Self-pay

## 2024-09-05 ENCOUNTER — Ambulatory Visit: Admitting: Cardiology

## 2025-01-07 ENCOUNTER — Ambulatory Visit: Admitting: Family Medicine

## 2025-01-09 ENCOUNTER — Ambulatory Visit: Admitting: Urology

## 2025-01-24 ENCOUNTER — Ambulatory Visit: Admitting: Dermatology

## 2025-05-10 ENCOUNTER — Ambulatory Visit
